# Patient Record
Sex: Female | Born: 1945 | Race: White | Hispanic: No | Smoking: Never smoker
Health system: Southern US, Community
[De-identification: ages and names within clinical notes are randomized; demographics above are authoritative.]

## PROBLEM LIST (undated history)

## (undated) DIAGNOSIS — I119 Hypertensive heart disease without heart failure: Secondary | ICD-10-CM

## (undated) DIAGNOSIS — C4490 Unspecified malignant neoplasm of skin, unspecified: Secondary | ICD-10-CM

## (undated) DIAGNOSIS — M7989 Other specified soft tissue disorders: Secondary | ICD-10-CM

## (undated) DIAGNOSIS — F32A Depression, unspecified: Secondary | ICD-10-CM

## (undated) DIAGNOSIS — F419 Anxiety disorder, unspecified: Secondary | ICD-10-CM

## (undated) DIAGNOSIS — E039 Hypothyroidism, unspecified: Secondary | ICD-10-CM

## (undated) DIAGNOSIS — E041 Nontoxic single thyroid nodule: Secondary | ICD-10-CM

## (undated) DIAGNOSIS — R21 Rash and other nonspecific skin eruption: Secondary | ICD-10-CM

## (undated) DIAGNOSIS — I5042 Chronic combined systolic (congestive) and diastolic (congestive) heart failure: Secondary | ICD-10-CM

## (undated) DIAGNOSIS — Z0389 Encounter for observation for other suspected diseases and conditions ruled out: Secondary | ICD-10-CM

## (undated) DIAGNOSIS — R0602 Shortness of breath: Secondary | ICD-10-CM

## (undated) DIAGNOSIS — R351 Nocturia: Secondary | ICD-10-CM

## (undated) DIAGNOSIS — G4733 Obstructive sleep apnea (adult) (pediatric): Secondary | ICD-10-CM

## (undated) DIAGNOSIS — G4730 Sleep apnea, unspecified: Secondary | ICD-10-CM

## (undated) DIAGNOSIS — I2699 Other pulmonary embolism without acute cor pulmonale: Secondary | ICD-10-CM

## (undated) DIAGNOSIS — M2550 Pain in unspecified joint: Secondary | ICD-10-CM

## (undated) DIAGNOSIS — N950 Postmenopausal bleeding: Secondary | ICD-10-CM

## (undated) DIAGNOSIS — Z9289 Personal history of other medical treatment: Secondary | ICD-10-CM

## (undated) DIAGNOSIS — E669 Obesity, unspecified: Secondary | ICD-10-CM

## (undated) DIAGNOSIS — IMO0001 Reserved for inherently not codable concepts without codable children: Secondary | ICD-10-CM

## (undated) DIAGNOSIS — Z86718 Personal history of other venous thrombosis and embolism: Secondary | ICD-10-CM

## (undated) DIAGNOSIS — M1990 Unspecified osteoarthritis, unspecified site: Secondary | ICD-10-CM

## (undated) DIAGNOSIS — I82403 Acute embolism and thrombosis of unspecified deep veins of lower extremity, bilateral: Secondary | ICD-10-CM

## (undated) HISTORY — DX: Obesity, unspecified: E66.9

## (undated) HISTORY — DX: Personal history of other medical treatment: Z92.89

## (undated) HISTORY — DX: Hypertensive heart disease without heart failure: I11.9

## (undated) HISTORY — DX: Pain in unspecified joint: M25.50

## (undated) HISTORY — DX: Chronic combined systolic (congestive) and diastolic (congestive) heart failure: I50.42

## (undated) HISTORY — DX: Obstructive sleep apnea (adult) (pediatric): G47.33

## (undated) HISTORY — DX: Reserved for inherently not codable concepts without codable children: IMO0001

## (undated) HISTORY — DX: Other specified soft tissue disorders: M79.89

## (undated) HISTORY — DX: Personal history of other venous thrombosis and embolism: Z86.718

## (undated) HISTORY — DX: Unspecified malignant neoplasm of skin, unspecified: C44.90

## (undated) HISTORY — DX: Hypothyroidism, unspecified: E03.9

## (undated) HISTORY — DX: Shortness of breath: R06.02

## (undated) HISTORY — PX: CARDIOVASCULAR STRESS TEST: SHX262

## (undated) HISTORY — DX: Depression, unspecified: F32.A

## (undated) HISTORY — DX: Encounter for observation for other suspected diseases and conditions ruled out: Z03.89

## (undated) HISTORY — DX: Anxiety disorder, unspecified: F41.9

## (undated) HISTORY — DX: Sleep apnea, unspecified: G47.30

---

## 1986-05-16 HISTORY — PX: OTHER SURGICAL HISTORY: SHX169

## 1998-02-26 ENCOUNTER — Encounter: Payer: Self-pay | Admitting: Internal Medicine

## 1998-02-26 ENCOUNTER — Ambulatory Visit (HOSPITAL_COMMUNITY): Admission: RE | Admit: 1998-02-26 | Discharge: 1998-02-26 | Payer: Self-pay | Admitting: Internal Medicine

## 2000-02-25 ENCOUNTER — Encounter: Payer: Self-pay | Admitting: Internal Medicine

## 2000-02-25 ENCOUNTER — Ambulatory Visit (HOSPITAL_COMMUNITY): Admission: RE | Admit: 2000-02-25 | Discharge: 2000-02-25 | Payer: Self-pay | Admitting: Internal Medicine

## 2001-04-18 ENCOUNTER — Emergency Department (HOSPITAL_COMMUNITY): Admission: EM | Admit: 2001-04-18 | Discharge: 2001-04-18 | Payer: Self-pay | Admitting: Emergency Medicine

## 2002-07-04 ENCOUNTER — Ambulatory Visit (HOSPITAL_COMMUNITY): Admission: RE | Admit: 2002-07-04 | Discharge: 2002-07-04 | Payer: Self-pay | Admitting: Family Medicine

## 2002-07-04 ENCOUNTER — Encounter: Payer: Self-pay | Admitting: Family Medicine

## 2009-03-19 ENCOUNTER — Ambulatory Visit (HOSPITAL_COMMUNITY): Admission: RE | Admit: 2009-03-19 | Discharge: 2009-03-19 | Payer: Self-pay | Admitting: Obstetrics and Gynecology

## 2009-03-23 ENCOUNTER — Other Ambulatory Visit: Admission: RE | Admit: 2009-03-23 | Discharge: 2009-03-23 | Payer: Self-pay | Admitting: Obstetrics and Gynecology

## 2009-03-24 ENCOUNTER — Ambulatory Visit (HOSPITAL_COMMUNITY): Admission: RE | Admit: 2009-03-24 | Discharge: 2009-03-24 | Payer: Self-pay | Admitting: Obstetrics and Gynecology

## 2009-03-24 ENCOUNTER — Encounter (INDEPENDENT_AMBULATORY_CARE_PROVIDER_SITE_OTHER): Payer: Self-pay | Admitting: Obstetrics and Gynecology

## 2009-03-24 HISTORY — PX: HYSTEROSCOPY W/D&C: SHX1775

## 2010-08-17 ENCOUNTER — Other Ambulatory Visit: Payer: Self-pay | Admitting: Family Medicine

## 2010-08-17 ENCOUNTER — Inpatient Hospital Stay: Admission: RE | Admit: 2010-08-17 | Payer: Self-pay | Source: Ambulatory Visit

## 2010-08-17 DIAGNOSIS — E041 Nontoxic single thyroid nodule: Secondary | ICD-10-CM

## 2010-08-18 ENCOUNTER — Other Ambulatory Visit: Payer: Self-pay

## 2010-08-18 LAB — BASIC METABOLIC PANEL
BUN: 4 mg/dL — ABNORMAL LOW (ref 6–23)
CO2: 27 mEq/L (ref 19–32)
Calcium: 8.5 mg/dL (ref 8.4–10.5)
Chloride: 101 mEq/L (ref 96–112)
Creatinine, Ser: 0.54 mg/dL (ref 0.4–1.2)
GFR calc Af Amer: >60 mL/min (ref >60)
GFR calc non Af Amer: >60 mL/min (ref >60)
Glucose, Bld: 82 mg/dL (ref 70–99)
Potassium: 3.5 mEq/L (ref 3.5–5.1)
Sodium: 136 mEq/L (ref 135–145)

## 2010-08-19 ENCOUNTER — Ambulatory Visit
Admission: RE | Admit: 2010-08-19 | Discharge: 2010-08-19 | Disposition: A | Discharge status: Home / Self Care | Payer: 59 | Source: Ambulatory Visit | Attending: Family Medicine | Admitting: Family Medicine

## 2010-08-19 DIAGNOSIS — E041 Nontoxic single thyroid nodule: Secondary | ICD-10-CM

## 2012-05-30 ENCOUNTER — Other Ambulatory Visit: Payer: Self-pay | Admitting: Obstetrics and Gynecology

## 2012-06-19 ENCOUNTER — Encounter (HOSPITAL_COMMUNITY): Payer: Self-pay | Admitting: *Deleted

## 2012-06-20 ENCOUNTER — Ambulatory Visit (HOSPITAL_COMMUNITY): Payer: 59 | Admitting: Anesthesiology

## 2012-06-20 ENCOUNTER — Other Ambulatory Visit: Payer: Self-pay

## 2012-06-20 ENCOUNTER — Encounter (HOSPITAL_COMMUNITY): Payer: Self-pay | Admitting: Anesthesiology

## 2012-06-20 ENCOUNTER — Encounter (HOSPITAL_COMMUNITY)
Admission: RE | Disposition: A | Discharge status: Home / Self Care | Payer: Self-pay | Source: Ambulatory Visit | Attending: Obstetrics and Gynecology

## 2012-06-20 ENCOUNTER — Ambulatory Visit (HOSPITAL_COMMUNITY)
Admission: RE | Admit: 2012-06-20 | Discharge: 2012-06-20 | Disposition: A | Discharge status: Home / Self Care | Payer: 59 | Source: Ambulatory Visit | Attending: Obstetrics and Gynecology | Admitting: Obstetrics and Gynecology

## 2012-06-20 ENCOUNTER — Encounter (HOSPITAL_COMMUNITY): Payer: Self-pay | Admitting: *Deleted

## 2012-06-20 DIAGNOSIS — N950 Postmenopausal bleeding: Secondary | ICD-10-CM

## 2012-06-20 HISTORY — DX: Nontoxic single thyroid nodule: E04.1

## 2012-06-20 HISTORY — PX: DILATION AND CURETTAGE OF UTERUS: SHX78

## 2012-06-20 LAB — CBC
HCT: 41 % (ref 36.0–46.0)
Hemoglobin: 13.2 g/dL (ref 12.0–15.0)
RBC: 4.65 MIL/uL (ref 3.87–5.11)
WBC: 6.4 10*3/uL (ref 4.0–10.5)

## 2012-06-20 SURGERY — DILATION AND CURETTAGE
Anesthesia: Choice

## 2012-06-20 MED ORDER — MIDAZOLAM HCL 5 MG/5ML IJ SOLN
INTRAMUSCULAR | Status: DC | PRN
  Administered 2012-06-20: 1 mg via INTRAVENOUS

## 2012-06-20 MED ORDER — ONDANSETRON HCL 4 MG/2ML IJ SOLN
INTRAMUSCULAR | Status: AC
  Filled 2012-06-20: qty 2

## 2012-06-20 MED ORDER — LIDOCAINE HCL 0.5 % IJ SOLN
INTRAMUSCULAR | Status: AC
  Filled 2012-06-20: qty 1

## 2012-06-20 MED ORDER — PROPOFOL 10 MG/ML IV EMUL
INTRAVENOUS | Status: DC | PRN
  Administered 2012-06-20: 200 mg via INTRAVENOUS

## 2012-06-20 MED ORDER — FENTANYL CITRATE 0.05 MG/ML IJ SOLN
INTRAMUSCULAR | Status: AC
  Filled 2012-06-20: qty 2

## 2012-06-20 MED ORDER — LIDOCAINE HCL (CARDIAC) 20 MG/ML IV SOLN
INTRAVENOUS | Status: AC
  Filled 2012-06-20: qty 5

## 2012-06-20 MED ORDER — FENTANYL CITRATE 0.05 MG/ML IJ SOLN
25.0000 ug | INTRAMUSCULAR | Status: DC | PRN
  Administered 2012-06-20: 50 ug via INTRAVENOUS

## 2012-06-20 MED ORDER — 0.9 % SODIUM CHLORIDE (POUR BTL) OPTIME
TOPICAL | Status: DC | PRN
  Administered 2012-06-20: 1000 mL

## 2012-06-20 MED ORDER — KETOROLAC TROMETHAMINE 30 MG/ML IJ SOLN
INTRAMUSCULAR | Status: AC
  Administered 2012-06-20: 30 mg via INTRAVENOUS
  Filled 2012-06-20: qty 1

## 2012-06-20 MED ORDER — DEXAMETHASONE SODIUM PHOSPHATE 10 MG/ML IJ SOLN
INTRAMUSCULAR | Status: DC | PRN
  Administered 2012-06-20: 10 mg via INTRAVENOUS

## 2012-06-20 MED ORDER — PROPOFOL 10 MG/ML IV BOLUS: INTRAVENOUS | Status: DC | PRN

## 2012-06-20 MED ORDER — LIDOCAINE HCL 1 % IJ SOLN
INTRAMUSCULAR | Status: DC | PRN
  Administered 2012-06-20: 10 mL

## 2012-06-20 MED ORDER — MIDAZOLAM HCL 2 MG/2ML IJ SOLN
INTRAMUSCULAR | Status: AC
  Filled 2012-06-20: qty 2

## 2012-06-20 MED ORDER — PROPOFOL 10 MG/ML IV EMUL
INTRAVENOUS | Status: AC
  Filled 2012-06-20: qty 40

## 2012-06-20 MED ORDER — CEFAZOLIN SODIUM-DEXTROSE 2-3 GM-% IV SOLR: 2.0000 g | INTRAVENOUS | Status: DC

## 2012-06-20 MED ORDER — LIDOCAINE HCL (CARDIAC) 20 MG/ML IV SOLN
INTRAVENOUS | Status: DC | PRN
  Administered 2012-06-20: 80 mg via INTRAVENOUS

## 2012-06-20 MED ORDER — LACTATED RINGERS IV SOLN
INTRAVENOUS | Status: DC
  Administered 2012-06-20 (×2): via INTRAVENOUS

## 2012-06-20 MED ORDER — KETOROLAC TROMETHAMINE 30 MG/ML IJ SOLN
30.0000 mg | Freq: Once | INTRAMUSCULAR | Status: AC
  Administered 2012-06-20: 30 mg via INTRAVENOUS

## 2012-06-20 MED ORDER — DEXAMETHASONE SODIUM PHOSPHATE 10 MG/ML IJ SOLN
INTRAMUSCULAR | Status: AC
  Filled 2012-06-20: qty 1

## 2012-06-20 MED ORDER — ONDANSETRON HCL 4 MG/2ML IJ SOLN
INTRAMUSCULAR | Status: DC | PRN
  Administered 2012-06-20: 4 mg via INTRAVENOUS

## 2012-06-20 MED ORDER — FENTANYL CITRATE 0.05 MG/ML IJ SOLN
INTRAMUSCULAR | Status: AC
  Administered 2012-06-20: 50 ug via INTRAVENOUS
  Filled 2012-06-20: qty 2

## 2012-06-20 MED ORDER — CEFAZOLIN SODIUM-DEXTROSE 2-3 GM-% IV SOLR
INTRAVENOUS | Status: AC
  Administered 2012-06-20: 2 g via INTRAVENOUS
  Filled 2012-06-20: qty 50

## 2012-06-20 MED ORDER — FENTANYL CITRATE 0.05 MG/ML IJ SOLN
INTRAMUSCULAR | Status: DC | PRN
  Administered 2012-06-20: 50 ug via INTRAVENOUS

## 2012-06-20 SURGICAL SUPPLY — 13 items
CATH ROBINSON RED A/P 16FR (CATHETERS) ×2 IMPLANT
CLOTH BEACON ORANGE TIMEOUT ST (SAFETY) ×2 IMPLANT
CONTAINER PREFILL 10% NBF 60ML (FORM) ×4 IMPLANT
DRESSING TELFA 8X3 (GAUZE/BANDAGES/DRESSINGS) ×2 IMPLANT
GLOVE BIO SURGEON STRL SZ 6.5 (GLOVE) ×4 IMPLANT
GOWN STRL REIN XL XLG (GOWN DISPOSABLE) ×4 IMPLANT
NEEDLE SPNL 22GX3.5 QUINCKE BK (NEEDLE) ×2 IMPLANT
PACK VAGINAL MINOR WOMEN LF (CUSTOM PROCEDURE TRAY) ×2 IMPLANT
PAD OB MATERNITY 4.3X12.25 (PERSONAL CARE ITEMS) ×2 IMPLANT
PAD PREP 24X48 CUFFED NSTRL (MISCELLANEOUS) ×2 IMPLANT
SYR CONTROL 10ML LL (SYRINGE) ×2 IMPLANT
TOWEL OR 17X24 6PK STRL BLUE (TOWEL DISPOSABLE) ×4 IMPLANT
WATER STERILE IRR 1000ML POUR (IV SOLUTION) ×2 IMPLANT

## 2012-06-20 NOTE — Anesthesia Preprocedure Evaluation (Addendum)
Anesthesia Evaluation  Patient identified by MRN, date of birth, ID band Patient awake    Reviewed: Allergy & Precautions, H&P , Patient's Chart, lab work & pertinent test results, reviewed documented beta blocker date and time   Airway Mallampati: II TM Distance: >3 FB Neck ROM: full    Dental No notable dental hx.    Pulmonary  breath sounds clear to auscultation  Pulmonary exam normal       Cardiovascular hypertension (well controlled), Pt. on medications Rhythm:regular Rate:Normal     Neuro/Psych    GI/Hepatic   Endo/Other  Morbid obesity  Renal/GU      Musculoskeletal   Abdominal   Peds  Hematology   Anesthesia Other Findings Subtle EKG changes from 11/10; no signif changes to warrant further w/u for D&C. Pt has no sx of CAD. Mother alive and father dead of valvular Dz. BP well controlled  Reproductive/Obstetrics                          Anesthesia Physical Anesthesia Plan  ASA: III  Anesthesia Plan: General   Post-op Pain Management:    Induction: Intravenous  Airway Management Planned: LMA  Additional Equipment:   Intra-op Plan:   Post-operative Plan:   Informed Consent: I have reviewed the patients History and Physical, chart, labs and discussed the procedure including the risks, benefits and alternatives for the proposed anesthesia with the patient or authorized representative who has indicated his/her understanding and acceptance.   Dental Advisory Given  Plan Discussed with: CRNA and Surgeon  Anesthesia Plan Comments: (  Discussed  general anesthesia, including possible nausea, instrumentation of airway, sore throat,pulmonary aspiration, etc. I asked if the were any outstanding questions, or  concerns before we proceeded. )        Anesthesia Quick Evaluation

## 2012-06-20 NOTE — Brief Op Note (Signed)
06/20/2012  1:37 PM  PATIENT:  Nicole Gibbs  67 y.o. female  PRE-OPERATIVE DIAGNOSIS:  PMB cpt 58120  POST-OPERATIVE DIAGNOSIS:  post menopausal bleeding  PROCEDURE:  Procedure(s) (LRB) with comments: DILATATION AND CURETTAGE (N/A)  SURGEON:  Surgeon(s) and Role:    * Jeani Hawking, MD - Primary  PHYSICIAN ASSISTANT:   ASSISTANTS: none   ANESTHESIA:   paracervical block and MAC  EBL:  Total I/O In: 500 [I.V.:500] Out: -   BLOOD ADMINISTERED:none  DRAINS: none   LOCAL MEDICATIONS USED:  XYLOCAINE   SPECIMEN:  Source of Specimen:  uterine currettings  DISPOSITION OF SPECIMEN:  PATHOLOGY  COUNTS:  YES  TOURNIQUET:  * No tourniquets in log *  DICTATION: .Other Dictation: Dictation Number [id number]  PLAN OF CARE: Discharge to home after PACU  PATIENT DISPOSITION:  PACU - hemodynamically stable.   Delay start of Pharmacological VTE agent (>24hrs) due to surgical blood loss or risk of bleeding: not applicable

## 2012-06-20 NOTE — H&P (Signed)
67 year old female with postmenopausal bleeding. Patient had ultrasound last week stripe was greater than 2 cm. EMB showed possible endometrial polyp. Seen in office yesterday with very heavy bleeding and clots   Recommended D and C today  Medical HIstory Hypertension  Surgical history D and C  C Section   Meds  See list  Sulfa allergy  Afebrile VSS General alert and oriented Lung CTAB Car RRR Abdomen is soft and non tender  Pelvic yesterday moderate bleeding  IMPRESSION: Post menopausal bleeding  PLAN: D and C Risks reviewed Consent signed

## 2012-06-20 NOTE — Op Note (Signed)
Nicole Gibbs, Nicole Gibbs                 ACCOUNT NO.:  [id number]  MEDICAL RECORD NO.:  [id number]  LOCATION:  [facility]                          FACILITY:  [facility]  PHYSICIAN:  Jimeka Balan L. Daichi Moris, M.D.DATE OF BIRTH:  1945-06-13  DATE OF PROCEDURE:  06/20/2012 DATE OF DISCHARGE:  06/20/2012                              OPERATIVE REPORT   PREOPERATIVE DIAGNOSIS:  Postmenopausal bleeding.  POSTOPERATIVE DIAGNOSIS:  Postmenopausal bleeding.  PROCEDURE:  D and C.  SURGEON:  Nicole Gibbs, M.D.  ANESTHESIA:  MAC with paracervical block.  EBL:  Minimal.  COMPLICATIONS:  None.  DESCRIPTION OF PROCEDURE:  The patient was taken to the operating room. She had been counseled about the risk associated with the procedure, which is risk of infection, bleeding, and risk of uterine perforation. Once she was taken to the operating room, she was placed in the lithotomy position in standard fashion.  She was prepped and draped.  A speculum was inserted into the vagina.  Her vaginal bleeding was moderate.  The cervix was grasped with a tenaculum.  Paracervical block was performed in standard fashion.  The cervical internal os was gently dilated using the [facility] dilator and a small curette was inserted into the uterus.  A curettage was performed x3 with the tissue obtained which was moderate.  Polyp forceps were inserted 1 time and minimal amount of tissue was then removed.  I then observed for a long period of time in operating room, and I did not notice any excessive bleeding.  All tissue was sent to pathology.  All sponge, lap, and instrument counts were correct x2 after all instruments removed from the vagina.  The patient tolerated procedure well, and will be discharged home from recovery room.     [name], M.D.     Florestine Avers  D:  06/20/2012  T:  06/20/2012  Job:  [id number]

## 2012-06-20 NOTE — Transfer of Care (Signed)
Immediate Anesthesia Transfer of Care Note  Patient: Nicole Gibbs  Procedure(s) Performed: Procedure(s) (LRB) with comments: DILATATION AND CURETTAGE (N/A)  Patient Location: PACU  Anesthesia Type:General  Level of Consciousness: awake, alert  and oriented  Airway & Oxygen Therapy: Patient Spontanous Breathing and Patient connected to nasal cannula oxygen  Post-op Assessment: Report given to PACU RN and Post -op Vital signs reviewed and stable  Post vital signs: Reviewed and stable  Complications: No apparent anesthesia complications

## 2012-06-20 NOTE — Anesthesia Postprocedure Evaluation (Signed)
  Anesthesia Post-op Note  Patient: Nicole Gibbs  Procedure(s) Performed: Procedure(s) (LRB) with comments: DILATATION AND CURETTAGE (N/A)  Patient Location: PACU  Anesthesia Type:General  Level of Consciousness: awake, alert  and oriented  Airway and Oxygen Therapy: Patient Spontanous Breathing  Post-op Pain: none  Post-op Assessment: Post-op Vital signs reviewed, Patient's Cardiovascular Status Stable, Respiratory Function Stable, Patent Airway, No signs of Nausea or vomiting and Pain level controlled  Post-op Vital Signs: Reviewed and stable  Complications: No apparent anesthesia complications

## 2012-06-20 NOTE — Anesthesia Procedure Notes (Signed)
Procedure Name: LMA Insertion Date/Time: 06/20/2012 1:20 PM Performed by: Graciela Husbands Pre-anesthesia Checklist: Emergency Drugs available, Suction available, Patient being monitored, Patient identified and Timeout performed Patient Re-evaluated:Patient Re-evaluated prior to inductionOxygen Delivery Method: Circle system utilized Preoxygenation: Pre-oxygenation with 100% oxygen Intubation Type: IV induction LMA: LMA inserted and LMA with gastric port inserted LMA Size: 4.0 Number of attempts: 1 Airway Equipment and Method: Patient positioned with wedge pillow Placement Confirmation: positive ETCO2 and breath sounds checked- equal and bilateral Tube secured with: Tape Dental Injury: Teeth and Oropharynx as per pre-operative assessment  Difficulty Due To: Difficulty was anticipated

## 2012-06-22 ENCOUNTER — Encounter (HOSPITAL_COMMUNITY): Payer: Self-pay | Admitting: Obstetrics and Gynecology

## 2012-08-03 ENCOUNTER — Ambulatory Visit
Admission: RE | Admit: 2012-08-03 | Discharge: 2012-08-03 | Disposition: A | Discharge status: Home / Self Care | Payer: 59 | Source: Ambulatory Visit | Attending: Cardiovascular Disease | Admitting: Cardiovascular Disease

## 2012-08-03 ENCOUNTER — Other Ambulatory Visit: Payer: Self-pay | Admitting: Cardiovascular Disease

## 2012-08-03 ENCOUNTER — Other Ambulatory Visit: Payer: Self-pay

## 2012-08-03 DIAGNOSIS — R0602 Shortness of breath: Secondary | ICD-10-CM

## 2012-08-06 ENCOUNTER — Other Ambulatory Visit (HOSPITAL_COMMUNITY): Payer: Self-pay | Admitting: Cardiovascular Disease

## 2012-08-06 ENCOUNTER — Other Ambulatory Visit: Payer: Self-pay | Admitting: Cardiovascular Disease

## 2012-08-06 DIAGNOSIS — I82409 Acute embolism and thrombosis of unspecified deep veins of unspecified lower extremity: Secondary | ICD-10-CM

## 2012-08-06 DIAGNOSIS — R0602 Shortness of breath: Secondary | ICD-10-CM

## 2012-08-06 DIAGNOSIS — R609 Edema, unspecified: Secondary | ICD-10-CM

## 2012-08-06 DIAGNOSIS — R079 Chest pain, unspecified: Secondary | ICD-10-CM

## 2012-08-07 ENCOUNTER — Ambulatory Visit (HOSPITAL_COMMUNITY)
Admission: RE | Admit: 2012-08-07 | Discharge: 2012-08-07 | Disposition: A | Discharge status: Home / Self Care | Payer: 59 | Source: Ambulatory Visit | Attending: Cardiovascular Disease | Admitting: Cardiovascular Disease

## 2012-08-07 ENCOUNTER — Ambulatory Visit (HOSPITAL_COMMUNITY): Payer: 59

## 2012-08-07 ENCOUNTER — Encounter (HOSPITAL_COMMUNITY): Payer: Self-pay | Admitting: Cardiology

## 2012-08-07 ENCOUNTER — Inpatient Hospital Stay (HOSPITAL_COMMUNITY)
Admission: AD | Admit: 2012-08-07 | Discharge: 2012-08-14 | DRG: 176 | Disposition: A | Discharge status: Home / Self Care | Payer: 59 | Source: Ambulatory Visit | Attending: Cardiovascular Disease | Admitting: Cardiovascular Disease

## 2012-08-07 DIAGNOSIS — R9431 Abnormal electrocardiogram [ECG] [EKG]: Secondary | ICD-10-CM | POA: Diagnosis present

## 2012-08-07 DIAGNOSIS — I10 Essential (primary) hypertension: Secondary | ICD-10-CM | POA: Diagnosis present

## 2012-08-07 DIAGNOSIS — I82403 Acute embolism and thrombosis of unspecified deep veins of lower extremity, bilateral: Secondary | ICD-10-CM

## 2012-08-07 DIAGNOSIS — N939 Abnormal uterine and vaginal bleeding, unspecified: Secondary | ICD-10-CM | POA: Diagnosis present

## 2012-08-07 DIAGNOSIS — I498 Other specified cardiac arrhythmias: Secondary | ICD-10-CM | POA: Diagnosis present

## 2012-08-07 DIAGNOSIS — Z8489 Family history of other specified conditions: Secondary | ICD-10-CM

## 2012-08-07 DIAGNOSIS — R0600 Dyspnea, unspecified: Secondary | ICD-10-CM | POA: Diagnosis present

## 2012-08-07 DIAGNOSIS — E669 Obesity, unspecified: Secondary | ICD-10-CM | POA: Diagnosis present

## 2012-08-07 DIAGNOSIS — Z882 Allergy status to sulfonamides status: Secondary | ICD-10-CM

## 2012-08-07 DIAGNOSIS — N898 Other specified noninflammatory disorders of vagina: Secondary | ICD-10-CM | POA: Diagnosis present

## 2012-08-07 DIAGNOSIS — Z6841 Body Mass Index (BMI) 40.0 and over, adult: Secondary | ICD-10-CM

## 2012-08-07 DIAGNOSIS — I82409 Acute embolism and thrombosis of unspecified deep veins of unspecified lower extremity: Secondary | ICD-10-CM | POA: Diagnosis present

## 2012-08-07 DIAGNOSIS — R0989 Other specified symptoms and signs involving the circulatory and respiratory systems: Secondary | ICD-10-CM | POA: Diagnosis present

## 2012-08-07 DIAGNOSIS — I2699 Other pulmonary embolism without acute cor pulmonale: Principal | ICD-10-CM | POA: Diagnosis present

## 2012-08-07 DIAGNOSIS — Z8249 Family history of ischemic heart disease and other diseases of the circulatory system: Secondary | ICD-10-CM

## 2012-08-07 DIAGNOSIS — R609 Edema, unspecified: Secondary | ICD-10-CM

## 2012-08-07 DIAGNOSIS — R0602 Shortness of breath: Secondary | ICD-10-CM

## 2012-08-07 DIAGNOSIS — N390 Urinary tract infection, site not specified: Secondary | ICD-10-CM | POA: Diagnosis present

## 2012-08-07 DIAGNOSIS — R0609 Other forms of dyspnea: Secondary | ICD-10-CM | POA: Diagnosis present

## 2012-08-07 DIAGNOSIS — I824Z9 Acute embolism and thrombosis of unspecified deep veins of unspecified distal lower extremity: Secondary | ICD-10-CM | POA: Diagnosis present

## 2012-08-07 DIAGNOSIS — R079 Chest pain, unspecified: Secondary | ICD-10-CM

## 2012-08-07 LAB — APTT: aPTT: 37 seconds (ref 24–37)

## 2012-08-07 LAB — COMPREHENSIVE METABOLIC PANEL
ALT: 14 U/L (ref 0–35)
AST: 22 U/L (ref 0–37)
Albumin: 3.5 g/dL (ref 3.5–5.2)
Alkaline Phosphatase: 88 U/L (ref 39–117)
BUN: 9 mg/dL (ref 6–23)
CO2: 21 mEq/L (ref 19–32)
Calcium: 8.8 mg/dL (ref 8.4–10.5)
Chloride: 104 mEq/L (ref 96–112)
Creatinine, Ser: 0.62 mg/dL (ref 0.50–1.10)
GFR calc Af Amer: >90 mL/min (ref >90)
GFR calc non Af Amer: >90 mL/min (ref >90)
Glucose, Bld: 111 mg/dL — ABNORMAL HIGH (ref 70–99)
Potassium: 4 mEq/L (ref 3.5–5.1)
Sodium: 137 mEq/L (ref 135–145)
Total Bilirubin: 0.4 mg/dL (ref 0.3–1.2)
Total Protein: 7.3 g/dL (ref 6.0–8.3)

## 2012-08-07 LAB — CBC WITH DIFFERENTIAL/PLATELET
Basophils Absolute: 0 10*3/uL (ref 0.0–0.1)
Basophils Relative: 0 % (ref 0–1)
Eosinophils Absolute: 0.2 10*3/uL (ref 0.0–0.7)
Eosinophils Relative: 2 % (ref 0–5)
HCT: 39.3 % (ref 36.0–46.0)
Hemoglobin: 12.8 g/dL (ref 12.0–15.0)
Lymphocytes Relative: 30 % (ref 12–46)
Lymphs Abs: 3.2 10*3/uL (ref 0.7–4.0)
MCH: 26.6 pg (ref 26.0–34.0)
MCHC: 32.6 g/dL (ref 30.0–36.0)
MCV: 81.7 fL (ref 78.0–100.0)
Monocytes Absolute: 0.9 10*3/uL (ref 0.1–1.0)
Monocytes Relative: 8 % (ref 3–12)
Neutro Abs: 6.3 10*3/uL (ref 1.7–7.7)
Neutrophils Relative %: 59 % (ref 43–77)
Platelets: 524 10*3/uL — ABNORMAL HIGH (ref 150–400)
RBC: 4.81 MIL/uL (ref 3.87–5.11)
RDW: 13.5 % (ref 11.5–15.5)
WBC: 10.6 10*3/uL — ABNORMAL HIGH (ref 4.0–10.5)

## 2012-08-07 LAB — TROPONIN I: Troponin I: <0.3 ng/mL (ref <0.30)

## 2012-08-07 LAB — PROTIME-INR
INR: 1.1 (ref 0.00–1.49)
Prothrombin Time: 14.1 seconds (ref 11.6–15.2)

## 2012-08-07 MED ORDER — WARFARIN SODIUM 7.5 MG PO TABS
7.5000 mg | ORAL_TABLET | Freq: Once | ORAL | Status: AC
  Administered 2012-08-07: 7.5 mg via ORAL
  Filled 2012-08-07: qty 1

## 2012-08-07 MED ORDER — ALPRAZOLAM 0.25 MG PO TABS: 0.2500 mg | ORAL_TABLET | Freq: Three times a day (TID) | ORAL | Status: DC | PRN

## 2012-08-07 MED ORDER — REGADENOSON 0.4 MG/5ML IV SOLN
0.4000 mg | Freq: Once | INTRAVENOUS | Status: AC
  Administered 2012-08-07: 0.4 mg via INTRAVENOUS

## 2012-08-07 MED ORDER — WARFARIN - PHARMACIST DOSING INPATIENT
Freq: Every day | Status: DC
  Administered 2012-08-13: 18:00:00

## 2012-08-07 MED ORDER — ASPIRIN EC 81 MG PO TBEC
81.0000 mg | DELAYED_RELEASE_TABLET | Freq: Every day | ORAL | Status: DC
  Administered 2012-08-08 – 2012-08-14 (×7): 81 mg via ORAL
  Filled 2012-08-07 (×7): qty 1

## 2012-08-07 MED ORDER — PANTOPRAZOLE SODIUM 40 MG PO TBEC
40.0000 mg | DELAYED_RELEASE_TABLET | Freq: Every day | ORAL | Status: DC
  Administered 2012-08-08 – 2012-08-14 (×7): 40 mg via ORAL
  Filled 2012-08-07 (×7): qty 1

## 2012-08-07 MED ORDER — VERAPAMIL HCL ER 240 MG PO TBCR
240.0000 mg | EXTENDED_RELEASE_TABLET | Freq: Every day | ORAL | Status: DC
  Administered 2012-08-08 – 2012-08-14 (×7): 240 mg via ORAL
  Filled 2012-08-07 (×7): qty 1

## 2012-08-07 MED ORDER — ACETAMINOPHEN 325 MG PO TABS: 650.0000 mg | ORAL_TABLET | ORAL | Status: DC | PRN

## 2012-08-07 MED ORDER — WARFARIN VIDEO
Freq: Once | Status: AC
  Administered 2012-08-08: 12:00:00

## 2012-08-07 MED ORDER — ENOXAPARIN SODIUM 30 MG/0.3ML ~~LOC~~ SOLN
20.0000 mg | SUBCUTANEOUS | Status: AC
  Administered 2012-08-07: 20 mg via SUBCUTANEOUS
  Filled 2012-08-07: qty 0.2

## 2012-08-07 MED ORDER — NITROGLYCERIN 0.4 MG SL SUBL: 0.4000 mg | SUBLINGUAL_TABLET | SUBLINGUAL | Status: DC | PRN

## 2012-08-07 MED ORDER — ONDANSETRON HCL 4 MG/2ML IJ SOLN: 4.0000 mg | Freq: Four times a day (QID) | INTRAMUSCULAR | Status: DC | PRN

## 2012-08-07 MED ORDER — ZOLPIDEM TARTRATE 5 MG PO TABS
5.0000 mg | ORAL_TABLET | Freq: Every evening | ORAL | Status: DC | PRN
  Administered 2012-08-08 – 2012-08-13 (×6): 5 mg via ORAL
  Filled 2012-08-07 (×6): qty 1

## 2012-08-07 MED ORDER — COUMADIN BOOK
Freq: Once | Status: AC
  Administered 2012-08-07: 20:00:00
  Filled 2012-08-07: qty 1

## 2012-08-07 MED ORDER — ENOXAPARIN SODIUM 100 MG/ML ~~LOC~~ SOLN
100.0000 mg | Freq: Two times a day (BID) | SUBCUTANEOUS | Status: DC
  Administered 2012-08-08 – 2012-08-14 (×13): 100 mg via SUBCUTANEOUS
  Filled 2012-08-07 (×15): qty 1

## 2012-08-07 MED ORDER — TECHNETIUM TC 99M SESTAMIBI GENERIC - CARDIOLITE
30.0000 | Freq: Once | INTRAVENOUS | Status: AC | PRN
  Administered 2012-08-07: 30 via INTRAVENOUS

## 2012-08-07 MED ORDER — TECHNETIUM TC 99M SESTAMIBI GENERIC - CARDIOLITE
10.0000 | Freq: Once | INTRAVENOUS | Status: AC | PRN
  Administered 2012-08-07: 10 via INTRAVENOUS

## 2012-08-07 MED ORDER — AMINOPHYLLINE 25 MG/ML IV SOLN
75.0000 mg | Freq: Once | INTRAVENOUS | Status: AC
  Administered 2012-08-07: 75 mg via INTRAVENOUS

## 2012-08-07 NOTE — Procedures (Addendum)
[place] [facility] [facility] [address] [place] [address] [phone]  Cardiology Nuclear Med Study  Nicole Gibbs is a 67 y.o. female     MRN : [id number]     DOB: 11-30-1945  Procedure Date: 08/07/2012  Nuclear Med Background Indication for Stress Test:  Evaluation for Ischemia History:  NO PRIOR CARDIAC HISTORY Cardiac Risk Factors: Family History - CAD, Hypertension and Obesity  Symptoms:  DOE, Fatigue and SOB   Nuclear Pre-Procedure Caffeine/Decaff Intake:  10:00pm NPO After: 8:00am   IV Site: R Antecubital  IV 0.9% NS with Angio Cath:  22g  Chest Size (in):  N/A IV Started by: Emmit Pomfret, RN  Height: 5\' 2"  (1.575 m)  Cup Size: DD  BMI:  Body mass index is 42.06 kg/(m^2). Weight:  230 lb (104.327 kg)   Tech Comments:  N/A    Nuclear Med Study 1 or 2 day study: 1 day  Stress Test Type:  Lexiscan  Order Authorizing Provider:  Nanetta Batty, MD   Resting Radionuclide: Technetium 15m Sestamibi  Resting Radionuclide Dose: 11.0 mCi   Stress Radionuclide:  Technetium 89m Sestamibi  Stress Radionuclide Dose: 30.1 mCi           Stress Protocol Rest HR: 88 Stress HR: 101  Rest BP: 159/89 Stress BP:164/93  Exercise Time (min): n/a METS: n/a          Dose of Adenosine (mg):  n/a Dose of Lexiscan: 0.4 mg  Dose of Atropine (mg): n/a Dose of Dobutamine: n/a mcg/kg/min (at max HR)  Stress Test Technologist: Ernestene Mention, CCT Nuclear Technologist: Koren Shiver, CNMT   Rest Procedure:  Myocardial perfusion imaging was performed at rest 45 minutes following the intravenous administration of Technetium 1m Sestamibi. Stress Procedure:  The patient received IV Lexiscan 0.4 mg over 15-seconds.  Technetium 47m Sestamibi injected at 30-seconds.  Due to patient's extreme shortness of breath, she was given IV Aminophylline 75 mg. Symptom was resolved during recovery. There were no significant changes with Lexiscan.  Quantitative spect  images were obtained after a 45 minute delay.  Transient Ischemic Dilatation (Normal <1.22):  0.93 Lung/Heart Ratio (Normal <0.45):  0.35 QGS EDV:  80 ml QGS ESV:  39 ml LV Ejection Fraction: 51%  Rest ECG: NSR - Normal EKG and poor R wave progression  Stress ECG: No significant change from baseline ECG  QPS Raw Data Images:  Some RV uptake concerning for pulmonary hypertension Stress Images:  There is decreased uptake in the anterior wall. Rest Images:  There is decreased uptake in the anterior wall. Subtraction (SDS):  No evidence of ischemia.  Impression Exercise Capacity:  Lexiscan with no exercise. BP Response:  Hypotensive blood pressure response. Clinical Symptoms:  Extreme dyspnea ECG Impression:  No significant ECG changes with Lexiscan. Comparison with Prior Nuclear Study: No previous nuclear study performed  Overall Impression:  Low risk stress nuclear study. There is a fixed mid to distal anterior and apical defect which may represent scar versus breast attenuation artifact. There is hypokinesis of the distal anterior wall as well, favoring scar. RV uptake noted, suggesting possible pulmonary hypertension.  LV Wall Motion:  Mild distal anterior hypokinesis. EF 51%.  Chrystie Nose, MD, [facility] Board Certified in Nuclear Cardiology Attending Cardiologist [facility]  Chrystie Nose, MD  08/07/2012 5:50 PM

## 2012-08-07 NOTE — H&P (Signed)
Patient ID: Nicole Gibbs MRN: [id number], DOB/AGE: [birth year]   Admit date: 08/07/12   Primary Physician: Daisy Floro, MD Primary Cardiologist: Dr Allyson Sabal OB/GYN- Dr Jerre Simon  HPI: 67 y/o with no prior cardiac issues, referred to Dr Allyson Sabal 08/02/12 for SOB and an abnormal  EKG that was done at the time of an urgent [facility] Feb 2014. See his office notes for complete details. The pt has a history of significant vaginal bleeding and had to undergo an urgent D&C 06/20/12. She was then put on hormone shots. The pt has had DOE that had come on after recent medroxyprogesterone injections were started for vaginal bleeding. Dr Allyson Sabal ordered several studies including an echo, CXR, D dimer, Venous Dopplers, and a Lexiscan. Yesterday, ([place] the 24th) it was noted that her D- dimer was >5. Attempts were made in the office to contact the pt but these were unsuccessful. She came today as scheduled for her venous dopplers and Lexiscan. Her dopplers are in fact positive for bilat LE DVT and there is a high suspicion for pulmonary embolism with her history of SOB. She was given Lovenox in the office and will be admitted for CTA and Coumadin per pharmacy.   Problem List: Past Medical History  Diagnosis Date  . Thyroid nodule   . Hypertension     Past Surgical History  Procedure Laterality Date  . Cesarean section  1992  . Dilation and curettage of uterus  Nov 2011 (?)  . Dilation and curettage of uterus  06/20/2012    Procedure: DILATATION AND CURETTAGE;  Surgeon: Jeani Hawking, MD;  Location: [facility];  Service: Gynecology;  Laterality: N/A;     Allergies:  Allergies  Allergen Reactions  . Sulfa Antibiotics Hives     Home Medications Verapamil 240 Trazadone Naproxyn Medroxyprogesterone     Family History  Problem Relation Age of Onset  . Coronary artery disease Father 73  . Sudden death Sister      History   Social History  . Marital Status: Married    Spouse Name: N/A    Number  of Children: N/A  . Years of Education: N/A   Occupational History  . Not on file.   Social History Main Topics  . Smoking status: Never Smoker   . Smokeless tobacco: Not on file  . Alcohol Use: No  . Drug Use: No  . Sexually Active:    Other Topics Concern  . Not on file   Social History Narrative  . No narrative on file     Review of Systems: General: negative for chills, fever, night sweats or weight changes. Lots of stress, husband has  MS. Recent vaginal bleeding, S/P urgent D&C 06/20/12 Dermatological: negative for rash Urologic: negative for hematuria Abdominal: negative for nausea, vomiting, diarrhea, bright red blood per rectum, melena, or hematemesis Neurologic: negative for visual changes, syncope, or dizziness All other systems reviewed and are otherwise negative except as noted above.  Physical Exam: Blood pressure 130/78, pulse 94, temperature 97.4 F (36.3 C), temperature source Oral, resp. rate 20, height 5\' 2"  (1.575 m), weight 104.8 kg (231 lb 0.7 oz), SpO2 97.00%.  General appearance: alert, cooperative, no distress and morbidly obese Neck: no carotid bruit and no JVD Lungs: clear to auscultation bilaterally Heart: regular rate and rhythm Abdomen: obese Extremities: bilat 2+ LE edema Pulses: 2+ and symmetric Skin: Skin color, texture, turgor normal. No rashes or lesions Neurologic: Grossly normal    Labs:   Results for orders placed  during the hospital encounter of 08/07/12 (from the past 24 hour(s))  TROPONIN I     Status: None   Collection Time    08/07/12  7:29 PM      Result Value Range   Troponin I <0.30  <0.30 ng/mL  PROTIME-INR     Status: None   Collection Time    08/07/12  7:29 PM      Result Value Range   Prothrombin Time 14.1  11.6 - 15.2 seconds   INR 1.10  0.00 - 1.49  APTT     Status: None   Collection Time    08/07/12  7:29 PM      Result Value Range   aPTT 37  24 - 37 seconds  CBC WITH DIFFERENTIAL     Status: Abnormal    Collection Time    08/07/12  7:29 PM      Result Value Range   WBC 10.6 (*) 4.0 - 10.5 K/uL   RBC 4.81  3.87 - 5.11 MIL/uL   Hemoglobin 12.8  12.0 - 15.0 g/dL   HCT 16.1  09.6 - 04.5 %   MCV 81.7  78.0 - 100.0 fL   MCH 26.6  26.0 - 34.0 pg   MCHC 32.6  30.0 - 36.0 g/dL   RDW 40.9  81.1 - 91.4 %   Platelets 524 (*) 150 - 400 K/uL   Neutrophils Relative 59  43 - 77 %   Neutro Abs 6.3  1.7 - 7.7 K/uL   Lymphocytes Relative 30  12 - 46 %   Lymphs Abs 3.2  0.7 - 4.0 K/uL   Monocytes Relative 8  3 - 12 %   Monocytes Absolute 0.9  0.1 - 1.0 K/uL   Eosinophils Relative 2  0 - 5 %   Eosinophils Absolute 0.2  0.0 - 0.7 K/uL   Basophils Relative 0  0 - 1 %   Basophils Absolute 0.0  0.0 - 0.1 K/uL  TSH     Status: None   Collection Time    08/07/12  7:29 PM      Result Value Range   TSH 0.368  0.350 - 4.500 uIU/mL  COMPREHENSIVE METABOLIC PANEL     Status: Abnormal   Collection Time    08/07/12  7:29 PM      Result Value Range   Sodium 137  135 - 145 mEq/L   Potassium 4.0  3.5 - 5.1 mEq/L   Chloride 104  96 - 112 mEq/L   CO2 21  19 - 32 mEq/L   Glucose, Bld 111 (*) 70 - 99 mg/dL   BUN 9  6 - 23 mg/dL   Creatinine, Ser 7.82  0.50 - 1.10 mg/dL   Calcium 8.8  8.4 - 95.6 mg/dL   Total Protein 7.3  6.0 - 8.3 g/dL   Albumin 3.5  3.5 - 5.2 g/dL   AST 22  0 - 37 U/L   ALT 14  0 - 35 U/L   Alkaline Phosphatase 88  39 - 117 U/L   Total Bilirubin 0.4  0.3 - 1.2 mg/dL   GFR calc non Af Amer >90  >90 mL/min   GFR calc Af Amer >90  >90 mL/min  PROTIME-INR     Status: None   Collection Time    08/08/12  5:40 AM      Result Value Range   Prothrombin Time 14.0  11.6 - 15.2 seconds   INR 1.09  0.00 -  1.49     Radiology/Studies: Dg Chest 2 View  08/03/2012    IMPRESSION: No active lung disease.  Probable bronchitis.  Mild cardiomegaly.   Original Report Authenticated By: Dwyane Dee, M.D.     EKG: NSR/ ST poor anterior RW, inferior Q  ASSESSMENT AND PLAN:  Principal Problem:    Pulmonary embolism Active Problems:   DVT, bilat - office dopplers 08/07/12   Nonspecific abnormal  (EKG)   Vaginal bleeding- s/p urgent D&C Feb 5th 2014   Dyspnea on exertion   HTN (hypertension)   Family history of coronary artery bypass surgery   Family history of sudden death (sister)   Obesity, unspecified  Plan- Admit, Lovenox per pharmacy, CTA to r/o pulmonary embolism. Start Coumadin per pharmacy. Vaginal bleeding may be an issue. We will need to be in contact with Dr Vincente Poli.  ? Other options for vaginal bleeding,   Signed, Abelino Derrick, PA-C 08/08/2012, 10:40 AM   Patient seen and examined. Agree with assessment and plan. Pt seen in office and admitted with bilateral DVT's, increased D-dimer, sob, R/O pulmonary embolus. In office, pt was given lovenox 80 mg sq prior to leaving to [facility] for admission.   Lennette Bihari, MD, [facility] 08/08/2012 2:05 PM

## 2012-08-07 NOTE — Progress Notes (Signed)
Bilateral Lower Ext. Venous Duplex Completed. Nicole Gibbs D  

## 2012-08-07 NOTE — Progress Notes (Signed)
ANTICOAGULATION CONSULT NOTE - Initial Consult  Pharmacy Consult for Lovenox and Coumadin Indication: DVT and high suspicion for PE  Allergies  Allergen Reactions  . Sulfa Antibiotics Hives    Patient Measurements: Height: 5\' 2"  (157.5 cm) Weight: 231 lb 0.7 oz (104.8 kg) IBW/kg (Calculated) : 50.1  Vital Signs: Temp: 97.6 F (36.4 C) (03/25 1955) Temp src: Oral (03/25 1955) BP: 153/91 mmHg (03/25 1955) Pulse Rate: 101 (03/25 1955)  Labs:  Recent Labs  08/07/12 1929  HGB 12.8  HCT 39.3  PLT 524*  APTT 37  LABPROT 14.1  INR 1.10  CREATININE 0.62  TROPONINI <0.30    Estimated Creatinine Clearance: 80.8 ml/min (by C-G formula based on Cr of 0.62).   Medical History: Past Medical History  Diagnosis Date  . Thyroid nodule   . Hypertension     Medications:  Prescriptions prior to admission  Medication Sig Dispense Refill  . ibuprofen (ADVIL,MOTRIN) 200 MG tablet Take 200 mg by mouth 2 (two) times daily as needed for pain.      . medroxyPROGESTERone (DEPO-PROVERA) 150 MG/ML injection Inject 150 mg into the muscle every 3 (three) months.      Marland Kitchen PRESCRIPTION MEDICATION Apply 1 application topically daily as needed (NYSTOP POWDER.  Apply underneath breasts.).      Marland Kitchen VERAPAMIL HCL PO Take 1 tablet by mouth daily. Patient not sure of strength,pharmacy did not have on file - sometimes husband gets through mail order      . zolpidem (AMBIEN) 10 MG tablet Take 5-10 mg by mouth at bedtime as needed for sleep.        Assessment: 67 y/o female referred to cardiologist for SOB and abnormal EKG found to have B/L DVT and high suspicion for PE. SOB developed shortly after having medroxyprogesterone injections for vaginal bleeding. Pharmacy consulted to begin Lovenox and Coumadin. Of note, patient had Lovenox 80 mg SQ in the office today ~17:00 but this dose is low for her weight. Baseline INR, SCr, and CBC wnl. Coumadin score is 5.  Goal of Therapy:  INR 2-3 Anti-Xa level  0.6-1.2 units/ml 4hrs after LMWH dose given Monitor platelets by anticoagulation protocol: Yes   Plan:  -Lovenox 20 mg SQ now (to total 100 mg) -Lovenox 100 mg SQ q12h - dose due 3/26 06:00 -Coumadin 7.5 mg po tonight -INR daily, CBC q72h -Coumadin book and video -Monitor for signs/symptoms of bleeding  [facility], [address].D., BCPS Clinical Pharmacist Pager: [phone] 08/07/2012 8:31 PM

## 2012-08-08 ENCOUNTER — Inpatient Hospital Stay (HOSPITAL_COMMUNITY): Admission: RE | Admit: 2012-08-08 | Payer: 59 | Source: Ambulatory Visit

## 2012-08-08 ENCOUNTER — Inpatient Hospital Stay (HOSPITAL_COMMUNITY): Payer: 59

## 2012-08-08 ENCOUNTER — Encounter (HOSPITAL_COMMUNITY): Payer: Self-pay | Admitting: Cardiology

## 2012-08-08 DIAGNOSIS — N939 Abnormal uterine and vaginal bleeding, unspecified: Secondary | ICD-10-CM | POA: Diagnosis present

## 2012-08-08 DIAGNOSIS — R0600 Dyspnea, unspecified: Secondary | ICD-10-CM | POA: Diagnosis present

## 2012-08-08 DIAGNOSIS — E669 Obesity, unspecified: Secondary | ICD-10-CM | POA: Diagnosis present

## 2012-08-08 HISTORY — PX: TRANSTHORACIC ECHOCARDIOGRAM: SHX275

## 2012-08-08 LAB — PROTIME-INR: INR: 1.09 (ref 0.00–1.49)

## 2012-08-08 LAB — TSH: TSH: 0.368 u[IU]/mL (ref 0.350–4.500)

## 2012-08-08 MED ORDER — WARFARIN SODIUM 7.5 MG PO TABS
7.5000 mg | ORAL_TABLET | Freq: Once | ORAL | Status: AC
  Administered 2012-08-08: 7.5 mg via ORAL
  Filled 2012-08-08: qty 1

## 2012-08-08 MED ORDER — METOPROLOL TARTRATE 25 MG PO TABS
25.0000 mg | ORAL_TABLET | Freq: Two times a day (BID) | ORAL | Status: DC
  Administered 2012-08-08 – 2012-08-10 (×5): 25 mg via ORAL
  Filled 2012-08-08 (×6): qty 1

## 2012-08-08 MED ORDER — IOHEXOL 350 MG/ML SOLN
100.0000 mL | Freq: Once | INTRAVENOUS | Status: AC | PRN
  Administered 2012-08-08: 100 mL via INTRAVENOUS

## 2012-08-08 NOTE — Progress Notes (Signed)
*  PRELIMINARY RESULTS* Echocardiogram 2D Echocardiogram has been performed.  Nicole Gibbs 08/08/2012, 10:06 AM

## 2012-08-08 NOTE — Progress Notes (Signed)
ANTICOAGULATION CONSULT NOTE - Follow Up Consult  Pharmacy Consult for Lovenox and Coumadin Indication: DVT, bilateral; high suspicion for PE  Allergies  Allergen Reactions  . Sulfa Antibiotics Hives    Patient Measurements: Height: 5\' 2"  (157.5 cm) Weight: 231 lb 0.7 oz (104.8 kg) IBW/kg (Calculated) : 50.1  Vital Signs: Temp: 97.4 F (36.3 C) (03/26 0519) Temp src: Oral (03/26 0519) BP: 130/78 mmHg (03/26 0519) Pulse Rate: 94 (03/26 0519)  Labs:  Recent Labs  08/07/12 1929 08/08/12 0540  HGB 12.8  --   HCT 39.3  --   PLT 524*  --   APTT 37  --   LABPROT 14.1 14.0  INR 1.10 1.09  CREATININE 0.62  --   TROPONINI <0.30  --     Estimated Creatinine Clearance: 80.8 ml/min (by C-G formula based on Cr of 0.62).  Assessment:  Overlap day # 2 of 5 minumum Lovenox and Coumadin.  Lovenox 100 mg SQ q12hrs.  Coumadin begun with 7.5 mg last night. INR unchanged.  Chest CT pending.   Patient reports last Medroxyprogesterone injection about 3 weeks ago.  Vaginal bleeding has continued, despite D&C on 06/20/2012, but it has not yet increased since anticoagulants begun 3/25 pm. She will keep [place] informed. Expect no further Medroxyprogesterone will be given, due to thromboembolism. Discussed briefly with L. Kilroy, PA-C. Dr. Vincente Poli to be contacted at some point regarding further management of vaginal bleeding.   Goal of Therapy:  INR 2-3 Anti-Xa level 0.6-1.2 units/ml 4hrs after LMWH dose given Monitor platelets by anticoagulation protocol: Yes   Plan:   Continue Lovenox 100 mg SQ 12hrs.  Will repeat Coumadin 7.5 mg today.  Will follow up for any increased vaginal bleeding, as well as further plans for management.  Dennie Fetters, [place] Pager: [phone] 08/08/2012,1:03 PM

## 2012-08-08 NOTE — Progress Notes (Signed)
PATIENT JUST GOT BACK INTO BED AFTER VDING. IN THE BATHROOM, AND HAD 4 BEATS V-TACH. I WAS WITH HER AND SHE WAS STABLE WITH NO COMPLAINTS. CONT. TO MONITOR PATIENT AND RHYTHM.

## 2012-08-08 NOTE — Progress Notes (Signed)
Subjective:  She still has DOE, no SOB at rest.  Objective:  Vital Signs in the last 24 hours: Temp:  [97.4 F (36.3 C)-98.6 F (37 C)] 97.4 F (36.3 C) (03/26 0519) Pulse Rate:  [94-101] 94 (03/26 0519) Resp:  [18-20] 20 (03/26 0519) BP: (130-154)/(78-96) 130/78 mmHg (03/26 0519) SpO2:  [97 %] 97 % (03/26 0519) Weight:  [104.8 kg (231 lb 0.7 oz)] 104.8 kg (231 lb 0.7 oz) (03/25 1829)  Intake/Output from previous day:  Intake/Output Summary (Last 24 hours) at 08/08/12 1041 Last data filed at 08/08/12 0547  Gross per 24 hour  Intake    600 ml  Output    900 ml  Net   -300 ml   . aspirin EC  81 mg Oral Daily  . enoxaparin (LOVENOX) injection  100 mg Subcutaneous Q12H  . metoprolol tartrate  25 mg Oral BID  . pantoprazole  40 mg Oral Q0600  . verapamil  240 mg Oral Daily  . Warfarin - Pharmacist Dosing Inpatient   Does not apply q1800    Physical Exam: General appearance: alert, cooperative, no distress and morbidly obese Lungs: clear to auscultation bilaterally Heart: regular rate and rhythm 1/6 sem Abd: soft, BS+ EXT: less tender   Rate: 92  Rhythm: normal sinus rhythm and PACs, PVCs, one run of 4 bt SVT  Lab Results:  Recent Labs  08/07/12 1929  WBC 10.6*  HGB 12.8  PLT 524*    Recent Labs  08/07/12 1929  NA 137  K 4.0  CL 104  CO2 21  GLUCOSE 111*  BUN 9  CREATININE 0.62    Recent Labs  08/07/12 1929  TROPONINI <0.30   Hepatic Function Panel  Recent Labs  08/07/12 1929  PROT 7.3  ALBUMIN 3.5  AST 22  ALT 14  ALKPHOS 88  BILITOT 0.4   No results found for this basename: CHOL,  in the last 72 hours  Recent Labs  08/08/12 0540  INR 1.09    Imaging:  CHEST - 2 VIEW  Comparison: None.  Findings: No active infiltrate or effusion is seen. There are  prominent perihilar markings present which may indicate bronchitis.  The heart is mildly enlarged. There are degenerative changes  throughout the thoracic spine.  IMPRESSION:  No  active lung disease. Probable bronchitis. Mild cardiomegaly  Cardiac Studies:  Assessment/Plan:   Principal Problem:   Pulmonary embolism Active Problems:   DVT, bilat - office dopplers 08/07/12   Nonspecific abnormal  (EKG)   Vaginal bleeding- s/p urgent D&C Feb 5th 2014   Dyspnea on exertion   HTN (hypertension)   Family history of coronary artery bypass surgery   Family history of sudden death (sister)   Obesity, unspecified  Plan- Echo is done, result pending. CTA ordered. I'm concerned about vaginal bleeding issue- will discuss with MD. ? Does she need a hysterectomy before she is anticoagulated with Coumadin. Add low dose beta blocker for ectopy.  Corine Shelter PA-C 08/08/2012, 10:41 AM    Patient seen and examined. Agree with assessment and plan. CT apparently has not been done yet? Reason. Pt on full dose anticoagulation. Echo completed; will review. When I entered room, pt was sleeping with very loud snoring; very high liklihood for OSA; will need to schedule for PSG as outpatient.   Lennette Bihari, MD, [facility] 08/08/2012 2:15 PM

## 2012-08-09 ENCOUNTER — Ambulatory Visit (HOSPITAL_COMMUNITY): Admission: RE | Admit: 2012-08-09 | Payer: 59 | Source: Ambulatory Visit

## 2012-08-09 LAB — PROTIME-INR
INR: 1.28 (ref 0.00–1.49)
Prothrombin Time: 15.7 seconds — ABNORMAL HIGH (ref 11.6–15.2)

## 2012-08-09 MED ORDER — WARFARIN SODIUM 10 MG PO TABS
10.0000 mg | ORAL_TABLET | Freq: Once | ORAL | Status: DC
  Filled 2012-08-09: qty 1

## 2012-08-09 NOTE — Progress Notes (Signed)
ANTICOAGULATION CONSULT NOTE - Follow Up Consult  Pharmacy Consult for Lovenox and Coumadin Indication: DVT, bilateral; high suspicion for PE  Allergies  Allergen Reactions  . Sulfa Antibiotics Hives    Patient Measurements: Height: 5\' 2"  (157.5 cm) Weight: 231 lb 0.7 oz (104.8 kg) IBW/kg (Calculated) : 50.1  Vital Signs: Temp: 97.9 F (36.6 C) (03/27 0554) Temp src: Oral (03/27 0554) BP: 143/73 mmHg (03/27 0554) Pulse Rate: 90 (03/27 0554)  Labs:  Recent Labs  08/07/12 1929 08/08/12 0540 08/09/12 0615  HGB 12.8  --   --   HCT 39.3  --   --   PLT 524*  --   --   APTT 37  --   --   LABPROT 14.1 14.0 15.7*  INR 1.10 1.09 1.28  CREATININE 0.62  --   --   TROPONINI <0.30  --   --     Estimated Creatinine Clearance: 80.8 ml/min (by C-G formula based on Cr of 0.62).  Assessment: Overlap day # 3 of 5 minumum Lovenox and Coumadin.  Lovenox 100 mg SQ q12hrs.  INR 1.28 after 2 doses of coumadin 7.5 mg.  Chest CT 3/26 = significant B PE.      Patient reports last Medroxyprogesterone injection about 3 weeks ago.  Vaginal bleeding has continued, despite D&C on 06/20/2012, but it has not yet increased since anticoagulants begun 3/25 pm. She will keep [place] informed. Expect no further Medroxyprogesterone will be given, due to thromboembolism.  Dr. Vincente Poli to be consulted to eval for hormonal tx vs hysterectomy.   Pt mentioned possibility of alternate anticoagulant, xarelto.  Goal of Therapy:  INR 2-3 Anti-Xa level 0.6-1.2 units/ml 4hrs after LMWH dose given Monitor platelets by anticoagulation protocol: Yes   Plan:  Continue Lovenox 100 mg SQ 12hrs. Coumadin 10 mg po x 1 dose today F/u GYN consult  Will follow up for any increased vaginal bleeding, as well as further plans for management.  Herby Abraham, Pharm.D. [phone] 08/09/2012 11:24 AM

## 2012-08-09 NOTE — Progress Notes (Signed)
I spoke with Dr. Vincente Poli, Gynecologist to Nicole Gibbs. She does not feel that a vaginal hysterectomy will be the best option at this point, due to risk.  She is suggesting endometrial ablation. She would like to try this before the patient is fully anticoagulated. Her only concern is that due to the need for anesthesia, Lovenox would need to be held for 1 day.  Will discuss with cardiologist to see if patient is stable to go without anticoagulation for that period of time. Dr. Vincente Poli is anticipating sending the patient to [facility] to undergo the procedure, then transferring back to [facility] to continue with transition to Coumadin. The procedure can possibly be arranged for Monday 3/31.  She has asked me to discuss the plan with a cardiologist. She will seek arrangements at [facility]. She has asked to be contacted again tomorrow, directly at [phone] between 8:30 and 1:00 pm. Will contact pharmacy to see if bridge to Coumadin can be held and to keep on Lovenox. Will get MD input.   Allayne Butcher, PA-C [facility]

## 2012-08-09 NOTE — Progress Notes (Signed)
Pharmacy: anticoagulation for B PE and B DVT Ms. Mcmiller is going to [facility] on Monday for endometrial ablation so coumadin is going to be held and LMWH is going to be continued.  After the procedure she may be transitioned to Xarelto or resumed on coumadin. Herby Abraham, Pharm.D. [phone] 08/09/2012 2:19 PM

## 2012-08-09 NOTE — Progress Notes (Signed)
[facility]  Subjective: Feels mildly short of breath. Denies chest pain and leg pain. Continues to endorse vaginal bleeding.   Objective: Vital signs in last 24 hours: Temp:  [97.5 F (36.4 C)-97.9 F (36.6 C)] 97.9 F (36.6 C) (03/27 0554) Pulse Rate:  [82-90] 90 (03/27 0554) Resp:  [20] 20 (03/27 0554) BP: (126-143)/(73-87) 143/73 mmHg (03/27 0554) SpO2:  [96 %-98 %] 98 % (03/27 0554) Last BM Date: 08/07/12  Intake/Output from previous day: 03/26 0701 - 03/27 0700 In: -  Out: 1300 [Urine:1300] Intake/Output this shift:    Medications Current Facility-Administered Medications  Medication Dose Route Frequency Provider Last Rate Last Dose  . acetaminophen (TYLENOL) tablet 650 mg  650 mg Oral Q4H PRN Abelino Derrick, PA-C      . ALPRAZolam Prudy Feeler) tablet 0.25 mg  0.25 mg Oral TID PRN Abelino Derrick, PA-C      . aspirin EC tablet 81 mg  81 mg Oral Daily Abelino Derrick, PA-C   81 mg at 08/08/12 0951  . enoxaparin (LOVENOX) injection 100 mg  100 mg Subcutaneous Q12H [facility], RPH   100 mg at 08/09/12 0550  . metoprolol tartrate (LOPRESSOR) tablet 25 mg  25 mg Oral BID Abelino Derrick, PA-C   25 mg at 08/08/12 2211  . nitroGLYCERIN (NITROSTAT) SL tablet 0.4 mg  0.4 mg Sublingual Q5 Min x 3 PRN Abelino Derrick, PA-C      . ondansetron [facility]) injection 4 mg  4 mg Intravenous Q6H PRN Eda Paschal Kilroy, PA-C      . pantoprazole (PROTONIX) EC tablet 40 mg  40 mg Oral Q0600 Eda Paschal Kilroy, PA-C   40 mg at 08/09/12 0550  . verapamil (CALAN-SR) CR tablet 240 mg  240 mg Oral Daily Abelino Derrick, PA-C   240 mg at 08/08/12 0951  . Warfarin - Pharmacist Dosing Inpatient   Does not apply q1800 [facility]      . zolpidem [facility]) tablet 5 mg  5 mg Oral QHS PRN Abelino Derrick, PA-C   5 mg at 08/08/12 2211    PE: General appearance: alert, cooperative, no distress and mildly obese Lungs: clear to auscultation bilaterally Heart: regular rate  and rhythm Extremities: + bilatateral LEE, soft and non-tender Pulses: 2+ and symmetric Skin: warm and dry Neurologic: Grossly normal  Lab Results:   Recent Labs  08/07/12 1929  WBC 10.6*  HGB 12.8  HCT 39.3  PLT 524*   BMET  Recent Labs  08/07/12 1929  NA 137  K 4.0  CL 104  CO2 21  GLUCOSE 111*  BUN 9  CREATININE 0.62  CALCIUM 8.8   PT/INR  Recent Labs  08/07/12 1929 08/08/12 0540 08/09/12 0615  LABPROT 14.1 14.0 15.7*  INR 1.10 1.09 1.28    Studies/Results:  CT of Chest 2/26 *RADIOLOGY REPORT*  Clinical Data: Shortness of breath.  CT ANGIOGRAPHY CHEST  Technique: Multidetector CT imaging of the chest using the  standard protocol during bolus administration of intravenous  contrast. Multiplanar reconstructed images including MIPs were  obtained and reviewed to evaluate the vascular anatomy.  Contrast: OMNIPAQUE IOHEXOL 350 MG/ML SOLN  Comparison: None  Findings: The chest wall is unremarkable. No breast masses,  supraclavicular or axillary lymphadenopathy. The thyroid gland is  enlarged, particular the left lobe suggesting thyroid goiter. No  discrete mass. Sonographic correlation from 08/19/2010.  The bony thorax is intact. No destructive bone lesions  or spinal  canal compromise.  The heart is mildly enlarged. No pericardial effusion. No  mediastinal or hilar lymphadenopathy. The scattered lymph nodes  are noted. The esophagus is grossly normal. The aorta is normal  in caliber.  The pulmonary arterial tree is fairly well opacified. There are  significant bilateral pulmonary emboli, right greater than left.  Examination of the lung parenchyma demonstrates breathing motion  artifact. No edema, infiltrates or effusions.  The upper abdomen is grossly normal.  IMPRESSION:  1. Significant bilateral pulmonary emboli.  2. Cardiac enlargement.  3. Normal caliber aorta.  4. No acute pulmonary findings.  Original Report Authenticated By: Rudie Meyer, M.D.   2D Echo 2/26 Study Conclusions  - Left ventricle: The cavity size was normal. Wall thickness was normal. Systolic function was normal. The estimated ejection fraction was in the range of 55% to 60%. Wall motion was normal; there were no regional wall motion abnormalities. Doppler parameters are consistent with abnormal left ventricular relaxation (grade 1 diastolic dysfunction). - Aortic valve: Mildly calcified annulus. Trileaflet; mildly thickened, mildly calcified leaflets. Mild regurgitation. - Mitral valve: Mildly calcified annulus. Mildly thickened and calcified posterior leaflet . - Right ventricle: The cavity size was mildly dilated. Systolic function was mildly reduced. - Atrial septum: No defect or patent foramen ovale was identified. Transthoracic echocardiography. M-mode, complete 2D, spectral Doppler, and color Doppler. Height: Height: 157.5cm. Height: 62in. Weight: Weight: 104.8kg. Weight: 230.6lb. Body mass index: BMI: 42.3kg/m^2. Body surface area: BSA: 2.20m^2. Blood pressure: 130/78. Patient status: Inpatient. Location: Bedside.   Assessment/Plan  Principal Problem:   Pulmonary embolism Active Problems:   DVT, bilat - office dopplers 08/07/12   HTN (hypertension)   Nonspecific abnormal  (EKG)   Family history of coronary artery bypass surgery   Family history of sudden death (sister)   Vaginal bleeding- s/p urgent D&C Feb 5th 2014   Obesity, unspecified   Dyspnea on exertion  Plan: CT of the chest yesterday confirmed significant bilateral pulmonary emboli R>L. Continue with [place] Lovenox with transition to Coumadin. INR today is 1.28. Pharmacy is on board to help with dosing. She appears dyspneic when she talks. Her pulse ox was checked and was 94% on RA. Will place on continuous monitoring. Supplemental O2 is available if needed. Pt continues to endorse heavy vaginal bleeding. Will contact Gynecologist today, Dr. Marcelle Overlie. ? If patient  will need hysterectomy before starting Coumadin. 2D echo revealed normal LV function with an EF of 55-60%. No WMA. + grade I diastolic dysfunction. The RV was mildly dilated with mildly reduced systolic function. The RA was normal. Will continue to monitor. MD to follow with further recommendation.      LOS: 2 days    Brittainy M. Delmer Islam 08/09/2012 7:43 AM    Patient seen and examined. Agree with assessment and plan. CT confirms bilateral PE in this patient with bilateral DVT. She continues to have vaginal bleeding which she states has persisted since Nov 2013. Will ask Dr. Vincente Poli to evaluate for hormonal therapy vs need for hysterectomy in this patient who will need long term anticoagulation treatment. Need to obtain thrombophilia w/u.   Lennette Bihari, MD, [facility] 08/09/2012 9:34 AM

## 2012-08-10 LAB — CBC
HCT: 34.6 % — ABNORMAL LOW (ref 36.0–46.0)
MCH: 25.9 pg — ABNORMAL LOW (ref 26.0–34.0)
MCHC: 32.4 g/dL (ref 30.0–36.0)
MCV: 80.1 fL (ref 78.0–100.0)
RDW: 13.8 % (ref 11.5–15.5)

## 2012-08-10 MED ORDER — METOPROLOL TARTRATE 50 MG PO TABS
50.0000 mg | ORAL_TABLET | Freq: Two times a day (BID) | ORAL | Status: DC
  Administered 2012-08-10 – 2012-08-14 (×8): 50 mg via ORAL
  Filled 2012-08-10 (×10): qty 1

## 2012-08-10 MED ORDER — WARFARIN SODIUM 10 MG PO TABS
10.0000 mg | ORAL_TABLET | Freq: Once | ORAL | Status: AC
  Administered 2012-08-10: 10 mg via ORAL
  Filled 2012-08-10: qty 1

## 2012-08-10 NOTE — Progress Notes (Signed)
Subjective: No complaints currently.  Had SVT early am and then aberrant type tachycardia   Objective: Vital signs in last 24 hours: Temp:  [97.6 F (36.4 C)-98.7 F (37.1 C)] 98.7 F (37.1 C) (03/28 0418) Pulse Rate:  [86-95] 86 (03/28 0418) Resp:  [19-20] 20 (03/28 0418) BP: (129-141)/(74-85) 136/74 mmHg (03/28 0418) SpO2:  [94 %-100 %] 94 % (03/28 0418) Weight change:  Last BM Date: 08/09/12 Intake/Output from previous day: -610 03/27 0701 - 03/28 0700 In: 240 [P.O.:240] Out: 850 [Urine:850] Intake/Output this shift: Total I/O In: 360 [P.O.:360] Out: 400 [Urine:400]  PE: General:alert and oriented, no complaints, mild SOB with talking Heart:S1S2 RRR Lungs:clear no rales or wheezes Abd:+ BS, soft, non tender Ext:no edema  SVT brief but 2 episodes   Lab Results:  Recent Labs  08/07/12 1929 08/10/12 0510  WBC 10.6* 7.8  HGB 12.8 11.2*  HCT 39.3 34.6*  PLT 524* 516*   BMET  Recent Labs  08/07/12 1929  NA 137  K 4.0  CL 104  CO2 21  GLUCOSE 111*  BUN 9  CREATININE 0.62  CALCIUM 8.8    Recent Labs  08/07/12 1929  TROPONINI <0.30     Lab Results  Component Value Date   TSH 0.368 08/07/2012    Hepatic Function Panel  Recent Labs  08/07/12 1929  PROT 7.3  ALBUMIN 3.5  AST 22  ALT 14  ALKPHOS 88  BILITOT 0.4   Studies/Results: Ct Angio Chest Pe W/cm &/or Wo Cm  08/08/2012  *RADIOLOGY REPORT*  Clinical Data: Shortness of breath.  CT ANGIOGRAPHY CHEST  Technique:  Multidetector CT imaging of the chest using the standard protocol during bolus administration of intravenous contrast. Multiplanar reconstructed images including MIPs were obtained and reviewed to evaluate the vascular anatomy.  Contrast: OMNIPAQUE IOHEXOL 350 MG/ML SOLN  Comparison: None  Findings: The chest wall is unremarkable.  No breast masses, supraclavicular or axillary lymphadenopathy.  The thyroid gland is enlarged, particular the left lobe suggesting thyroid goiter.   No discrete mass.  Sonographic correlation from 08/19/2010.  The bony thorax is intact.  No destructive bone lesions or spinal canal compromise.  The heart is mildly enlarged.  No pericardial effusion.  No mediastinal or hilar lymphadenopathy.  The scattered lymph nodes are noted.  The esophagus is grossly normal.  The aorta is normal in caliber.  The pulmonary arterial tree is fairly well opacified.  There are significant bilateral pulmonary emboli, right greater than left.  Examination of the lung parenchyma demonstrates breathing motion artifact.  No edema, infiltrates or effusions.  The upper abdomen is grossly normal.  IMPRESSION:  1.  Significant bilateral pulmonary emboli. 2.  Cardiac enlargement. 3.  Normal caliber aorta. 4.  No acute pulmonary findings.   Original Report Authenticated By: Rudie Meyer, M.D.     Medications: I have reviewed the patient's current medications. Scheduled Meds: . aspirin EC  81 mg Oral Daily  . enoxaparin (LOVENOX) injection  100 mg Subcutaneous Q12H  . metoprolol tartrate  25 mg Oral BID  . pantoprazole  40 mg Oral Q0600  . verapamil  240 mg Oral Daily  . Warfarin - Pharmacist Dosing Inpatient   Does not apply q1800   Continuous Infusions:  PRN Meds:.acetaminophen, ALPRAZolam, nitroGLYCERIN, ondansetron (ZOFRAN) IV, zolpidem  Assessment/Plan: Principal Problem:   Pulmonary embolism, Bilateral Active Problems:   DVT, bilat - office dopplers 08/07/12   HTN (hypertension)   Nonspecific abnormal  (EKG)   Family history of  coronary artery bypass surgery   Family history of sudden death (sister)   Vaginal bleeding- s/p urgent D&C Feb 5th 2014   Obesity, unspecified   Dyspnea on exertion  PLAN:  Plan for transfer to [facility] for endometrial ablation on Monday then back to [facility].   Coumadin on hold/Lovenox continues.  SVT last pm. With sister sudden death at 20 (no Post was done) will need thrombophilia w/u.    Pt with many questions- has not talked  with GYN as of yet.  ? xarelto post procedure VS. Coumadin, will check cost of xarelto for the pt.    LOS: 3 days   Time spent with pt. : 25 minutes. INGOLD,LAURA R 08/10/2012, 10:30 AM  I have seen and evaluated the patient this PM along with Nada Boozer, NP. I agree with her findings, examination as well as impression recommendations.  Confusing situation with +/- full dose anticoagulation -- Vaginal Bleeding & Gyn input.  Initially plan was hold long term AC until post GYN procedure, but now we are hearing that they would prefer + Warfarin therapeutic.  --> restarting warfarin today.   Will need to verify timing of Gyn procedure & when to convert [facility]. -- would be nice to use Xarelto, but not until bleeding issue is fully resolved. Will need thrombophilia w/u - but to get full w/u, need to be off AC --> ? Check after 6 months Rx for PE.  With SVT - will increase BB dose.  Marykay Lex, M.D., M.S. [facility] [address] [place], [place]  [address]  [phone] Pager # [phone] 08/10/2012 5:54 PM

## 2012-08-10 NOTE — Progress Notes (Addendum)
I called and spoke to Dr. Vincente Poli about treatment plan. Then spoke to pt about treatment plan. Pt is agreeable to taking anticoagulants over the weekend and seeing how her vaginal bleeding is affected. She is aware that if her bleeding doesn't get worse, then she may not need the surgery at this time. She will monitor the bleeding over the weekend to report off to Glandorf the PA so that Dr. Vincente Poli can decide if she needs the surgery or not. Will continue to monitor.

## 2012-08-10 NOTE — Progress Notes (Signed)
Pt had episode of SVT with HR in 190s. Pt only complaining of sharp headache that comes and goes, but has no dyspnea or SOB. Pt now back in SR. PA aware. Will continue to monitor.

## 2012-08-10 NOTE — Care Management Note (Unsigned)
    Page 1 of 1   08/10/2012     2:34:19 PM   CARE MANAGEMENT NOTE 08/10/2012  Patient:  Nicole Gibbs   Account Number:  [id number]  Date Initiated:  08/10/2012  Documentation initiated by:  Ludivina Guymon  Subjective/Objective Assessment:   PT ADM ON 08/07/12 WITH BILATERAL PE, DVT AND UTERINE BLEEDING.  PTA, PT INDEPENDENT, LIVES WITH SPOUSE.     Action/Plan:   WILL FOLLOW FOR HOME NEEDS AS PT PROGRESSES.  ENDOMETRIAL ABLATION SCHEDULED FOR 3/31 AT [facility]  WILL LIKELY DC ON XARELTO.   Anticipated DC Date:  08/15/2012   Anticipated DC Plan:  HOME/SELF CARE      DC Planning Services  CM consult      Choice offered to / List presented to:             Status of service:  In process, will continue to follow Medicare Important Message given?   (If response is "NO", the following Medicare IM given date fields will be blank) Date Medicare IM given:   Date Additional Medicare IM given:    Discharge Disposition:    Per UR Regulation:  Reviewed for med. necessity/level of care/duration of stay  If discussed at Long Length of Stay Meetings, dates discussed:    Comments:

## 2012-08-10 NOTE — Progress Notes (Signed)
I spoke with Dr. Vincente Poli again today. She has spoken with an anesthesiologist, who has recommenced to do the endometrial ablation while she is fully anticoagulated on Coumadin. I have contacted pharmacy and have asked to restart Coumadin with Lovenox bridge. Nada Boozer, NP will be in contact with Dr. Vincente Poli over the weekend (her contact is [phone]). Endometrial ablation will now be tentatively planned for Wednesday 4/2 at [facility].   Allayne Butcher, PA-C [facility]

## 2012-08-10 NOTE — Progress Notes (Signed)
ANTICOAGULATION CONSULT NOTE - Follow Up Consult  Pharmacy Consult for Coumadin Indication: DVT, bilateral; high suspicion for PE  Allergies  Allergen Reactions  . Sulfa Antibiotics Hives    Patient Measurements: Height: 5\' 2"  (157.5 cm) Weight: 231 lb 0.7 oz (104.8 kg) IBW/kg (Calculated) : 50.1  Vital Signs: Temp: 98.6 F (37 C) (03/28 1344) Temp src: Oral (03/28 1344) BP: 137/67 mmHg (03/28 1344) Pulse Rate: 90 (03/28 1344)  Labs:  Recent Labs  08/07/12 1929 08/08/12 0540 08/09/12 0615 08/10/12 0510  HGB 12.8  --   --  11.2*  HCT 39.3  --   --  34.6*  PLT 524*  --   --  516*  APTT 37  --   --   --   LABPROT 14.1 14.0 15.7* 15.7*  INR 1.10 1.09 1.28 1.28  CREATININE 0.62  --   --   --   TROPONINI <0.30  --   --   --     Estimated Creatinine Clearance: 80.8 ml/min (by C-G formula based on Cr of 0.62).  Assessment: Overlap day # 3 of 5 minumum Lovenox and Coumadin for B/L PE and DVT.  Spoke with Robbie Lis, PA and she has asked pharmacy to resume Coumadin for this patient. Plan is for patient to have an endometrial ablation on 4/2 while she is fully anticoagulated on Coumadin. INR is subtherapeutic. H/H are low, platelets are wnl.  Goal of Therapy:  INR 2-3 Anti-Xa level 0.6-1.2 units/ml 4hrs after LMWH dose given Monitor platelets by anticoagulation protocol: Yes   Plan:  -Continue Lovenox 100 mg SQ q12h as previously ordered -Coumadin 10 mg po today -Monitor for signs/symptoms of bleeding  [facility], [address].D., BCPS Clinical Pharmacist Pager: [phone] 08/10/2012 4:27 PM

## 2012-08-11 LAB — BASIC METABOLIC PANEL
CO2: 23 mEq/L (ref 19–32)
Calcium: 8.5 mg/dL (ref 8.4–10.5)
Chloride: 107 mEq/L (ref 96–112)
Creatinine, Ser: 0.61 mg/dL (ref 0.50–1.10)
Glucose, Bld: 90 mg/dL (ref 70–99)

## 2012-08-11 MED ORDER — WARFARIN SODIUM 10 MG PO TABS
10.0000 mg | ORAL_TABLET | Freq: Once | ORAL | Status: AC
  Administered 2012-08-11: 10 mg via ORAL
  Filled 2012-08-11 (×2): qty 1

## 2012-08-11 NOTE — Progress Notes (Signed)
Subjective:  No CP/SOB  Objective:  Temp:  [97.9 F (36.6 C)-98.6 F (37 C)] 97.9 F (36.6 C) (03/29 0436) Pulse Rate:  [77-90] 77 (03/29 0436) Resp:  [18] 18 (03/29 0436) BP: (130-142)/(62-71) 142/62 mmHg (03/29 0436) SpO2:  [95 %-97 %] 95 % (03/29 0436) Weight:  [104.7 kg (230 lb 13.2 oz)] 104.7 kg (230 lb 13.2 oz) (03/29 0436) Weight change:   Intake/Output from previous day: 03/28 0701 - 03/29 0700 In: 600 [P.O.:600] Out: 1250 [Urine:1250]  Intake/Output from this shift:    Physical Exam: General appearance: alert and no distress Neck: no adenopathy, no carotid bruit, no JVD, supple, symmetrical, trachea midline and thyroid not enlarged, symmetric, no tenderness/mass/nodules Lungs: clear to auscultation bilaterally Heart: regular rate and rhythm, S1, S2 normal, no murmur, click, rub or gallop Extremities: extremities normal, atraumatic, no cyanosis or edema  Lab Results: Results for orders placed during the hospital encounter of 08/07/12 (from the past 48 hour(s))  CBC     Status: Abnormal   Collection Time    08/10/12  5:10 AM      Result Value Range   WBC 7.8  4.0 - 10.5 K/uL   RBC 4.32  3.87 - 5.11 MIL/uL   Hemoglobin 11.2 (*) 12.0 - 15.0 g/dL   HCT 16.1 (*) 09.6 - 04.5 %   MCV 80.1  78.0 - 100.0 fL   MCH 25.9 (*) 26.0 - 34.0 pg   MCHC 32.4  30.0 - 36.0 g/dL   RDW 40.9  81.1 - 91.4 %   Platelets 516 (*) 150 - 400 K/uL  PROTIME-INR     Status: Abnormal   Collection Time    08/10/12  5:10 AM      Result Value Range   Prothrombin Time 15.7 (*) 11.6 - 15.2 seconds   INR 1.28  0.00 - 1.49  BASIC METABOLIC PANEL     Status: None   Collection Time    08/11/12  5:00 AM      Result Value Range   Sodium 140  135 - 145 mEq/L   Potassium 3.6  3.5 - 5.1 mEq/L   Chloride 107  96 - 112 mEq/L   CO2 23  19 - 32 mEq/L   Glucose, Bld 90  70 - 99 mg/dL   BUN 9  6 - 23 mg/dL   Creatinine, Ser 7.82  0.50 - 1.10 mg/dL   Calcium 8.5  8.4 - 95.6 mg/dL   GFR calc non Af  Amer >90  >90 mL/min   GFR calc Af Amer >90  >90 mL/min   Comment:            The eGFR has been calculated     using the CKD EPI equation.     This calculation has not been     validated in all clinical     situations.     eGFR's persistently     <90 mL/min signify     possible Chronic Kidney Disease.  MAGNESIUM     Status: None   Collection Time    08/11/12  5:00 AM      Result Value Range   Magnesium 2.2  1.5 - 2.5 mg/dL  PROTIME-INR     Status: Abnormal   Collection Time    08/11/12  5:00 AM      Result Value Range   Prothrombin Time 16.8 (*) 11.6 - 15.2 seconds   INR 1.40  0.00 - 1.49    Imaging: Imaging  results have been reviewed  Assessment/Plan:   1. Principal Problem: 2.   Pulmonary embolism, Bilateral 3. Active Problems: 4.   DVT, bilat - office dopplers 08/07/12 5.   HTN (hypertension) 6.   Nonspecific abnormal  (EKG) 7.   Family history of coronary artery bypass surgery 8.   Family history of sudden death (sister) 20.   Vaginal bleeding- s/p urgent D&C Feb 5th 2014 10.   Obesity, unspecified 11.   Dyspnea on exertion 12.   Time Spent Directly with Patient:  20 minutes  Length of Stay:  LOS: 4 days   Pt with bilateral pulm emboli and bilateral tibial vein DVTs. LV fxn nl although RV mildly dilated and hypokinetic. Getting Lovenox and Coumadin AC per pharmacy. Plan is to fully anticoagulate to INR 2.5-3.0, then D/C Lovenox and assess for vaginal bleeding. Dr. Vincente Poli aware. Exam benign. I don't think thrombophilia w/u was done prior to initiating coumadin.   Nicole Gibbs 08/11/2012, 9:32 AM

## 2012-08-11 NOTE — Progress Notes (Signed)
ANTICOAGULATION CONSULT NOTE - Follow Up Consult  Pharmacy Consult for Lovenox and Coumadin Indication: pulmonary embolus and DVT, both bilateral  Patient Measurements: Height: 5\' 2"  (157.5 cm) Weight: 230 lb 13.2 oz (104.7 kg) IBW/kg (Calculated) : 50.1  Vital Signs: Temp: 98 F (36.7 C) (03/29 1411) Temp src: Oral (03/29 1411) BP: 136/92 mmHg (03/29 1411) Pulse Rate: 80 (03/29 1411)  Labs:  Recent Labs  08/09/12 0615 08/10/12 0510 08/11/12 0500  HGB  --  11.2*  --   HCT  --  34.6*  --   PLT  --  516*  --   LABPROT 15.7* 15.7* 16.8*  INR 1.28 1.28 1.40  CREATININE  --   --  0.61    Estimated Creatinine Clearance: 80.6 ml/min (by C-G formula based on Cr of 0.61).  Assessment:  Overlap day # 4 of 5 minumum Lovenox and Coumadin.  Continues on Lovenox 100 mg SQ q12hrs.    INR increasing slowly.  Coumadin 7.5 mg given 3/25 & 3/26, held 3/27 with plans for procedure 3/31, then resumed 3/28 with 10 mg, with plans for possible endometrial ablation on 4/2, while on therapeutic Coumadin.  She has questions about Xarelto, and whether she may be tranistioned to Xarelto at some point.   She reports no vaginal bleeding 3/28, and minimal today.  Goal of Therapy:  INR 2-3, but prefer 2.5-3.0 per Cardiology Monitor platelets by anticoagulation protocol: Yes   Plan:   Continue Lovenox 100 mg SQ 12hrs.  Will repeat Coumadin 10 mg today.  Will follow up for any further vaginal bleeding.  Dennie Fetters, RPh Pager: [phone] 08/11/2012,3:55 PM

## 2012-08-12 LAB — PROTIME-INR
INR: 1.87 — ABNORMAL HIGH (ref 0.00–1.49)
Prothrombin Time: 20.8 seconds — ABNORMAL HIGH (ref 11.6–15.2)

## 2012-08-12 MED ORDER — WARFARIN SODIUM 10 MG PO TABS
10.0000 mg | ORAL_TABLET | Freq: Once | ORAL | Status: AC
  Administered 2012-08-12: 10 mg via ORAL
  Filled 2012-08-12: qty 1

## 2012-08-12 NOTE — Progress Notes (Signed)
Subjective: No complaints, did have small amount vaginal bleeding  Objective: Vital signs in last 24 hours: Temp:  [97.5 F (36.4 C)-98.7 F (37.1 C)] 98.7 F (37.1 C) (03/30 0451) Pulse Rate:  [80-100] 100 (03/30 0451) Resp:  [18-20] 20 (03/30 0451) BP: (132-139)/(74-92) 132/76 mmHg (03/30 0451) SpO2:  [95 %-98 %] 97 % (03/30 0451) Weight:  [104.101 kg (229 lb 8 oz)] 104.101 kg (229 lb 8 oz) (03/30 0451) Weight change: -0.6 kg (-1 lb 5.2 oz) Last BM Date: 08/11/12 Intake/Output from previous day:   Intake/Output this shift:   Per Dr. Allyson Sabal PE: General:alert and oriented, pleasant affect Heart:S1S2 RRR Lungs:clear without rales or rhonchi Abd:+ BS, soft non tender Ext:no edema    Lab Results:  Recent Labs  08/10/12 0510  WBC 7.8  HGB 11.2*  HCT 34.6*  PLT 516*   BMET  Recent Labs  08/11/12 0500  NA 140  K 3.6  CL 107  CO2 23  GLUCOSE 90  BUN 9  CREATININE 0.61  CALCIUM 8.5      Lab Results  Component Value Date   TSH 0.368 08/07/2012      Studies/Results: No results found.  Medications: I have reviewed the patient's current medications. Scheduled Meds: . aspirin EC  81 mg Oral Daily  . enoxaparin (LOVENOX) injection  100 mg Subcutaneous Q12H  . metoprolol tartrate  50 mg Oral BID  . pantoprazole  40 mg Oral Q0600  . verapamil  240 mg Oral Daily  . Warfarin - Pharmacist Dosing Inpatient   Does not apply q1800   Continuous Infusions:  PRN Meds:.acetaminophen, ALPRAZolam, nitroGLYCERIN, ondansetron (ZOFRAN) IV, zolpidem  Assessment/Plan: Principal Problem:   Pulmonary embolism, Bilateral Active Problems:   DVT, bilat - office dopplers 08/07/12   HTN (hypertension)   Nonspecific abnormal  (EKG), negative nucluear stress test 08/07/12   Family history of coronary artery bypass surgery   Family history of sudden death (sister)   Vaginal bleeding- s/p urgent D&C Feb 5th 2014   Obesity, unspecified   Dyspnea on exertion  PLAN: INR 1.87, sm  amt vaginal bleeding.  Continue coumadin and lovenox crossover.     LOS: 5 days   Time spent with pt. Including MD time  15  minutes. INGOLD,LAURA R 08/12/2012, 9:00 AM   Agree with note written by Nada Boozer RNP  Getting Lovenox and coumadin for DVT/VTE. INR 1.87. Minimal vaginal bleeding at this time but still sub therapeutic. Dr. Vincente Poli aware.  Runell Gess 08/12/2012 9:59 AM

## 2012-08-12 NOTE — Progress Notes (Signed)
ANTICOAGULATION CONSULT NOTE - Follow Up Consult  Pharmacy Consult for Lovenox and Coumadin Indication: pulmonary embolus and DVT, both bilateral  Patient Measurements: Height: 5\' 2"  (157.5 cm) Weight: 229 lb 8 oz (104.101 kg) IBW/kg (Calculated) : 50.1  Vital Signs: Temp: 98.1 F (36.7 C) (03/30 1412) Temp src: Oral (03/30 1412) BP: 120/69 mmHg (03/30 1412) Pulse Rate: 83 (03/30 1412)  Labs:  Recent Labs  08/10/12 0510 08/11/12 0500 08/12/12 0643  HGB 11.2*  --   --   HCT 34.6*  --   --   PLT 516*  --   --   LABPROT 15.7* 16.8* 20.8*  INR 1.28 1.40 1.87*  CREATININE  --  0.61  --     Estimated Creatinine Clearance: 80.4 ml/min (by C-G formula based on Cr of 0.61).  Assessment:  Overlap day # 5 of 5 minumum Lovenox and Coumadin.  Continues on Lovenox 100 mg SQ q12hrs.    INR increasing towards goal.  Coumadin 7.5 mg given 3/25 & 3/26, held 3/27 with plans for procedure 3/31, then resumed 3/28 with 10 mg, repeated 10 mg on 3/29.  For possible endometrial ablation on 4/2, while on therapeutic Coumadin. Discussed Coumadin use, monitoring, precautions and potential drug-drug and drug-food interactions. She had questions about Xarelto, and whether she may be tranistioned to Xarelto at some point.   Prior heavy vaginal bleeding, despite D&C on 06/20/12. Had been on medroxyprogesterone injections, last ~3 weeks ago. Expect no further injections, with current thromboembolism. She reports no vaginal bleeding 3/28,  minimal 3/29, and minimal again today.   Goal of Therapy:  INR 2-3, but prefer 2.5-3.0 per Cardiology Monitor platelets by anticoagulation protocol: Yes   Plan:   Continue Lovenox 100 mg SQ 12hrs.  Will repeat Coumadin 10 mg today.  Will follow up for any further vaginal bleeding.  Dennie Fetters, [place] Pager: [phone] 08/12/2012,4:56 PM

## 2012-08-13 LAB — CBC
HCT: 38.3 % (ref 36.0–46.0)
Hemoglobin: 11.7 g/dL — ABNORMAL LOW (ref 12.0–15.0)
RDW: 13.8 % (ref 11.5–15.5)
WBC: 8.4 10*3/uL (ref 4.0–10.5)

## 2012-08-13 LAB — PROTIME-INR
INR: 2.73 — ABNORMAL HIGH (ref 0.00–1.49)
Prothrombin Time: 27.6 seconds — ABNORMAL HIGH (ref 11.6–15.2)

## 2012-08-13 MED ORDER — WARFARIN SODIUM 2.5 MG PO TABS
2.5000 mg | ORAL_TABLET | Freq: Once | ORAL | Status: AC
  Administered 2012-08-13: 2.5 mg via ORAL
  Filled 2012-08-13: qty 1

## 2012-08-13 NOTE — Progress Notes (Signed)
ANTICOAGULATION CONSULT NOTE - Follow Up Consult  Pharmacy Consult for Lovenox and Coumadin Indication: pulmonary embolus and DVT, both bilateral  Labs:  Recent Labs  08/11/12 0500 08/12/12 0643 08/13/12 0520  HGB  --   --  11.7*  HCT  --   --  38.3  PLT  --   --  544*  LABPROT 16.8* 20.8* 27.6*  INR 1.40 1.87* 2.73*  CREATININE 0.61  --   --     Estimated Creatinine Clearance: 80.4 ml/min (by C-G formula based on Cr of 0.61).  Assessment: Overlap day # 6 of 5 minumum Lovenox and Coumadin. Continues on Lovenox 100 mg SQ q12hrs.   INR is therapeutic at 2.73 (huge jump in INR, from 1.87)  For possible endometrial ablation on 4/2, while on therapeutic Coumadin.   Discussed Coumadin use, monitoring, precautions and potential drug-drug and drug-food interactions. She had questions about Xarelto, and whether she may be tranistioned to Xarelto at some point.  Prior heavy vaginal bleeding, despite D&C on 06/20/12. Had been on medroxyprogesterone injections, last ~3 weeks ago. Expect no further injections, with current thromboembolism. She reports no vaginal bleeding 3/28, minimal vaginal bleeding today  ? Plan to go home  Goal of Therapy:  INR 2-3, but prefer 2.5-3.0 per Cardiology Monitor platelets by anticoagulation protocol: Yes   Plan:  Continue Lovenox 100 mg SQ 12hrs for now, discontinue in AM if INR therapeutic or prior to dc  Decrease Coumadin to 2.5 mg po x 1 today due to jump in INR.  If home today, recommend 2.5 mg today, with 7.5 mg tomorrow and follow up INR on Wednesday  Will follow up for any further vaginal bleeding.  Thank you. Okey Regal, PharmD Pager: [phone] 08/13/2012,9:11 AM

## 2012-08-13 NOTE — Progress Notes (Signed)
[facility]  Subjective: SOB has improved. Denies CP.  Objective: Vital signs in last 24 hours: Temp:  [97.5 F (36.4 C)-98.1 F (36.7 C)] 97.5 F (36.4 C) (03/31 0416) Pulse Rate:  [77-85] 77 (03/31 0416) Resp:  [18-20] 18 (03/31 0416) BP: (106-123)/(69-74) 106/73 mmHg (03/31 0416) SpO2:  [95 %-99 %] 95 % (03/31 0416) Weight:  [229 lb 3.2 oz (103.964 kg)] 229 lb 3.2 oz (103.964 kg) (03/31 0416) Last BM Date: 08/13/12  Intake/Output from previous day:   Intake/Output this shift:    Medications Current Facility-Administered Medications  Medication Dose Route Frequency Provider Last Rate Last Dose  . acetaminophen (TYLENOL) tablet 650 mg  650 mg Oral Q4H PRN Abelino Derrick, PA-C      . ALPRAZolam Prudy Feeler) tablet 0.25 mg  0.25 mg Oral TID PRN Abelino Derrick, PA-C      . aspirin EC tablet 81 mg  81 mg Oral Daily Luke K Kilroy, PA-C   81 mg at 08/12/12 1001  . enoxaparin (LOVENOX) injection 100 mg  100 mg Subcutaneous Q12H Maryanna Shape [place], [place]   100 mg at 08/13/12 7829  . metoprolol (LOPRESSOR) tablet 50 mg  50 mg Oral BID Marykay Lex, MD   50 mg at 08/12/12 2135  . nitroGLYCERIN (NITROSTAT) SL tablet 0.4 mg  0.4 mg Sublingual Q5 Min x 3 PRN Eda Paschal Kilroy, PA-C      . ondansetron [facility] injection 4 mg  4 mg Intravenous Q6H PRN Eda Paschal Kilroy, PA-C      . pantoprazole (PROTONIX) EC tablet 40 mg  40 mg Oral Q0600 Abelino Derrick, PA-C   40 mg at 08/13/12 5621  . verapamil (CALAN-SR) CR tablet 240 mg  240 mg Oral Daily Abelino Derrick, PA-C   240 mg at 08/12/12 1001  . Warfarin - Pharmacist Dosing Inpatient   Does not apply q1800 [facility]      . zolpidem [facility]) tablet 5 mg  5 mg Oral QHS PRN Abelino Derrick, PA-C   5 mg at 08/12/12 2159    PE: General appearance: alert, cooperative, no distress and moderately obese Lungs: clear to auscultation bilaterally Heart: regular rate and rhythm Extremities: no LEE Pulses: 2+ and  symmetric Skin: warm and dry Neurologic: Grossly normal  Lab Results:   Recent Labs  08/13/12 0520  WBC 8.4  HGB 11.7*  HCT 38.3  PLT 544*   BMET  Recent Labs  08/11/12 0500  NA 140  K 3.6  CL 107  CO2 23  GLUCOSE 90  BUN 9  CREATININE 0.61  CALCIUM 8.5   PT/INR  Recent Labs  08/11/12 0500 08/12/12 0643 08/13/12 0520  LABPROT 16.8* 20.8* 27.6*  INR 1.40 1.87* 2.73*    Assessment/Plan  Principal Problem:   Pulmonary embolism, Bilateral Active Problems:   DVT, bilat - office dopplers 08/07/12   HTN (hypertension)   Nonspecific abnormal  (EKG), negative nucluear stress test 08/07/12   Family history of coronary artery bypass surgery   Family history of sudden death (sister)   Vaginal bleeding- s/p urgent D&C Feb 5th 2014   Obesity, unspecified   Dyspnea on exertion  Plan: SOB has improved. No CP. INR is now therapeutic at 2.73. Continue with Coumadin with INR goal of 2.5-3.0. Pt still having a small amount of vaginal bleeding. Can consider discharging home today and following up with Dr. Vincente Poli as an OP to discuss endometrial ablation vs alternative  non-invasive treatments.     LOS: 6 days    Nicole Gibbs 08/13/2012 8:31 AM   Patient seen and examined. Agree with assessment and plan. Will plan for 1-2 day overlap with lovenox/coumadin once INR therapeutic in light of significant bilateral PEx burden. Breathing better. No chest pain or leg discomfort. Minimal vaginal bleeding "6 drops."  Possible DC tomorrow.   Lennette Bihari, MD, [facility] 08/13/2012 9:07 AM

## 2012-08-14 LAB — URINALYSIS, ROUTINE W REFLEX MICROSCOPIC
Glucose, UA: NEGATIVE mg/dL
Ketones, ur: NEGATIVE mg/dL
Protein, ur: NEGATIVE mg/dL
Urobilinogen, UA: 1 mg/dL (ref 0.0–1.0)

## 2012-08-14 LAB — PROTIME-INR: Prothrombin Time: 29.1 seconds — ABNORMAL HIGH (ref 11.6–15.2)

## 2012-08-14 LAB — URINE MICROSCOPIC-ADD ON

## 2012-08-14 MED ORDER — ASPIRIN 81 MG PO TBEC: 81.0000 mg | DELAYED_RELEASE_TABLET | Freq: Every day | ORAL | Status: DC

## 2012-08-14 MED ORDER — CIPROFLOXACIN HCL 250 MG PO TABS: 250.0000 mg | ORAL_TABLET | Freq: Two times a day (BID) | ORAL | Status: DC

## 2012-08-14 MED ORDER — WARFARIN SODIUM 7.5 MG PO TABS: 7.5000 mg | ORAL_TABLET | Freq: Every day | ORAL | Status: DC

## 2012-08-14 MED ORDER — NITROGLYCERIN 0.4 MG SL SUBL: 0.4000 mg | SUBLINGUAL_TABLET | SUBLINGUAL | Status: DC | PRN

## 2012-08-14 MED ORDER — METOPROLOL TARTRATE 50 MG PO TABS: 50.0000 mg | ORAL_TABLET | Freq: Two times a day (BID) | ORAL | Status: DC

## 2012-08-14 MED ORDER — WARFARIN SODIUM 2.5 MG PO TABS
2.5000 mg | ORAL_TABLET | Freq: Once | ORAL | Status: AC
  Administered 2012-08-14: 2.5 mg via ORAL
  Filled 2012-08-14: qty 1

## 2012-08-14 NOTE — Progress Notes (Signed)
ANTICOAGULATION CONSULT NOTE - Follow Up Consult  Pharmacy Consult for Lovenox and Coumadin Indication: pulmonary embolus and DVT, both bilateral  Labs:  Recent Labs  08/12/12 0643 08/13/12 0520 08/14/12 0510  HGB  --  11.7*  --   HCT  --  38.3  --   PLT  --  544*  --   LABPROT 20.8* 27.6* 29.1*  INR 1.87* 2.73* 2.94*    Estimated Creatinine Clearance: 80.4 ml/min (by C-G formula based on Cr of 0.61).  Assessment: Overlap day # 7 of 5 minumum Lovenox and Coumadin. Continues on Lovenox 100 mg SQ q12hrs.   INR is therapeutic at 2.94  Goal of Therapy:  INR 2-3, but prefer 2.5-3.0 per Cardiology Monitor platelets by anticoagulation protocol: Yes   Plan:  1) DC Lovenox 2) Repeat Coumadin 2.5 mg po x 1 today 3) If home today, recommend 7.5 mg po daily with follow up INR on Friday  Thank you. Okey Regal, PharmD Pager: [phone] 08/14/2012,10:00 AM

## 2012-08-14 NOTE — Progress Notes (Signed)
[facility]  Subjective: Breathing continues to improve. No chest pain. + dysuria since last night.   Objective: Vital signs in last 24 hours: Temp:  [97.3 F (36.3 C)-98.7 F (37.1 C)] 97.3 F (36.3 C) (04/01 0647) Pulse Rate:  [76-87] 76 (04/01 0647) Resp:  [18] 18 (04/01 0647) BP: (111-140)/(63-76) 137/63 mmHg (04/01 0647) SpO2:  [96 %] 96 % (04/01 0647) Weight:  [229 lb 8 oz (104.1 kg)] 229 lb 8 oz (104.1 kg) (04/01 0647) Last BM Date: 08/13/12  Intake/Output from previous day: 03/31 0701 - 04/01 0700 In: 720 [P.O.:720] Out: 0  Intake/Output this shift:    Medications Current Facility-Administered Medications  Medication Dose Route Frequency Provider Last Rate Last Dose  . acetaminophen (TYLENOL) tablet 650 mg  650 mg Oral Q4H PRN Abelino Derrick, PA-C      . ALPRAZolam Prudy Feeler) tablet 0.25 mg  0.25 mg Oral TID PRN Abelino Derrick, PA-C      . aspirin EC tablet 81 mg  81 mg Oral Daily Abelino Derrick, PA-C   81 mg at 08/13/12 1125  . enoxaparin (LOVENOX) injection 100 mg  100 mg Subcutaneous Q12H [facility] [place], [place]   100 mg at 08/14/12 4098  . metoprolol (LOPRESSOR) tablet 50 mg  50 mg Oral BID Marykay Lex, MD   50 mg at 08/13/12 2158  . nitroGLYCERIN (NITROSTAT) SL tablet 0.4 mg  0.4 mg Sublingual Q5 Min x 3 PRN Eda Paschal Kilroy, PA-C      . ondansetron [facility]) injection 4 mg  4 mg Intravenous Q6H PRN Eda Paschal Kilroy, PA-C      . pantoprazole (PROTONIX) EC tablet 40 mg  40 mg Oral Q0600 Abelino Derrick, PA-C   40 mg at 08/14/12 1191  . verapamil (CALAN-SR) CR tablet 240 mg  240 mg Oral Daily Abelino Derrick, PA-C   240 mg at 08/13/12 1125  . Warfarin - Pharmacist Dosing Inpatient   Does not apply q1800 [facility], [facility]      . zolpidem [facility]) tablet 5 mg  5 mg Oral QHS PRN Abelino Derrick, PA-C   5 mg at 08/13/12 2158    PE: General appearance: alert, cooperative, no distress and moderately obese Lungs: clear to auscultation  bilaterally Heart: regular rate and rhythm Extremities: no LEE Pulses: 2+ and symmetric Skin: wam and dry Neurologic: Grossly normal  Lab Results:   Recent Labs  08/13/12 0520  WBC 8.4  HGB 11.7*  HCT 38.3  PLT 544*   BMET No results found for this basename: NA, K, CL, CO2, GLUCOSE, BUN, CREATININE, CALCIUM,  in the last 72 hours PT/INR  Recent Labs  08/12/12 0643 08/13/12 0520 08/14/12 0510  LABPROT 20.8* 27.6* 29.1*  INR 1.87* 2.73* 2.94*    Assessment/Plan  Principal Problem:   Pulmonary embolism, Bilateral Active Problems:   DVT, bilat - office dopplers 08/07/12   HTN (hypertension)   Nonspecific abnormal  (EKG), negative nucluear stress test 08/07/12   Family history of coronary artery bypass surgery   Family history of sudden death (sister)   Vaginal bleeding- s/p urgent D&C Feb 5th 2014   Obesity, unspecified   Dyspnea on exertion  Plan: INR remains therapeutic at 2.94. Will need to resume Coumadin therapy at home with INR goal of 2.5-3.0. Will arrange for patient to follow-up at [facility] for INR clinic. HR and BP both stable. Consider discharging home today. Per Dr. Vincente Poli, the patient will  follow-up as an OP to discuss endometrial ablation vs other noninvasive therapies. Pt also endorsing dysuria. Will order U/A. If UTI, will treat with antibiotics as OP. MD to follow.     LOS: 7 days    Brittainy M. Delmer Islam 08/14/2012 7:44 AM  Agree with note written by Boyce Medici  [facility]  OK for D/C. INR theraputic. No CP/SOB. Exam benign. No signif vaginal bleeding. ROV with [facility] 1-2 weeks then me there after. Needs early follow up with Kristen as well in coumadin clinic.   Runell Gess 08/14/2012 9:01 AM

## 2012-08-14 NOTE — Discharge Summary (Signed)
Physician Discharge Summary  Patient ID: Nicole Gibbs MRN: [id number] DOB/AGE: [birth year] 67 y.o.  Admit date: 08/07/2012 Discharge date: 08/14/2012  Admission Diagnoses: Pulmonary Embolism, Bitateral  Discharge Diagnoses:  Principal Problem:   Pulmonary embolism, Bilateral Active Problems:   DVT, bilat - office dopplers 08/07/12   HTN (hypertension)   Nonspecific abnormal  (EKG), negative nucluear stress test 08/07/12   Family history of coronary artery bypass surgery   Family history of sudden death (sister)   Vaginal bleeding- s/p urgent D&C Feb 5th 2014   Obesity, unspecified   Dyspnea on exertion   Discharged Condition: stable  Hospital Course: The patient is a 67 y.o. moderately overweight female who was referred to Dr. Allyson Sabal, by Dr. Marcelle Overlie, OB/GYN, for evaluation of an abnormal EKG and fairly rapidly-progressing shortness of breath of unclear etiology. She apparently was having vaginal bleeding and was scheduled to undergo a D&C, and was noted by the anesthesiologist to have an abnormal EKG.  The patient had also recently started medroxyprogesterone injections to help control her bleeding. In the office labs were obtained and bilateral lower extremity dopplers were performed. She had a D-dimer elevated >5. Her dopplers were positive for bilateral lower extremity DVT.  There was high suspision for pulmonary embolism. She was given Lovenox in the office and was admitted directly to [facility]. A CTA of the chest was ordered, which confirmed significant bilateral pulmonary emboli. She was started on Coumadin with a Lovenox bridge. Her therapeutic INR goal was 2.5-3.0. Pharmacy was consulted to help with dosing. The patient was also noted to have ectopy on telemetry and was placed on a low dose of beta blocker. She also continued to endorse vaginal bleeding. Dr. Vincente Poli was notified. It was agreed to have her follow up as an outpatient to discuss endometrial ablation vs other noninvasive  treatments. On hospital day 6, she reached a therapeutic INR level of 2.73. It was decided to keep her for an additional day for overlap with Lovenox/Coumadin. On day 7, her INR was 2.94. Her shortness of breath had improved significantly. The patient did however endorse dysuria. A urinalysis suggested a UTI. She was placed on a 7 day course of ciprofloxacin. She was evaluated by pharmacy who recommended continuing with Coumadin at a dose of 7.5 mg po daily with follow up INR in 3 days post discharge. The patient was last seen and examined by Dr. Allyson Sabal, who felt she was stable for discharge home. She is scheduled for an IRN check at [facility] on 08/17/12. She will continue with Coumadin indefinetly and will have periodic INR monitoring at [facility]. She is scheduled to follow-up with Nada Boozer, NP at [facility] on 08/27/12.    Consults: None  Significant Diagnostic Studies:   CT Anigo of the Chest 2012-08-17 *RADIOLOGY REPORT*  Clinical Data: Shortness of breath.  CT ANGIOGRAPHY CHEST  Technique: Multidetector CT imaging of the chest using the  standard protocol during bolus administration of intravenous  contrast. Multiplanar reconstructed images including MIPs were  obtained and reviewed to evaluate the vascular anatomy.  Contrast: OMNIPAQUE IOHEXOL 350 MG/ML SOLN  Comparison: None  Findings: The chest wall is unremarkable. No breast masses,  supraclavicular or axillary lymphadenopathy. The thyroid gland is  enlarged, particular the left lobe suggesting thyroid goiter. No  discrete mass. Sonographic correlation from 08/19/2010.  The bony thorax is intact. No destructive bone lesions or spinal  canal compromise.  The heart is mildly enlarged. No pericardial effusion. No  mediastinal or hilar lymphadenopathy.  The scattered lymph nodes  are noted. The esophagus is grossly normal. The aorta is normal  in caliber.  The pulmonary arterial tree is fairly well opacified. There are  significant bilateral  pulmonary emboli, right greater than left.  Examination of the lung parenchyma demonstrates breathing motion  artifact. No edema, infiltrates or effusions.  The upper abdomen is grossly normal.  IMPRESSION:  1. Significant bilateral pulmonary emboli.  2. Cardiac enlargement.  3. Normal caliber aorta.  4. No acute pulmonary findings.  Original Report Authenticated By: Rudie Meyer, M.D.    Treatments: See Hospital Course  Discharge Exam: Blood pressure 137/63, pulse 76, temperature 97.3 F (36.3 C), temperature source Oral, resp. rate 18, height 5\' 2"  (1.575 m), weight 229 lb 8 oz (104.1 kg), SpO2 96.00%.   Disposition: 01-Home or Self Care  Discharge Orders   Future Orders Complete By Expires     Diet - low sodium heart healthy  As directed     Increase activity slowly  As directed         Medication List    TAKE these medications       aspirin 81 MG EC tablet  Take 1 tablet (81 mg total) by mouth daily.     ciprofloxacin 250 MG tablet  Commonly known as:  CIPRO  Take 1 tablet (250 mg total) by mouth 2 (two) times daily.     ibuprofen 200 MG tablet  Commonly known as:  ADVIL,MOTRIN  Take 200 mg by mouth 2 (two) times daily as needed for pain.     medroxyPROGESTERone 150 MG/ML injection  Commonly known as:  DEPO-PROVERA  Inject 150 mg into the muscle every 3 (three) months.     metoprolol 50 MG tablet  Commonly known as:  LOPRESSOR  Take 1 tablet (50 mg total) by mouth 2 (two) times daily.     nitroGLYCERIN 0.4 MG SL tablet  Commonly known as:  NITROSTAT  Place 1 tablet (0.4 mg total) under the tongue every 5 (five) minutes x 3 doses as needed for chest pain.     PRESCRIPTION MEDICATION  Apply 1 application topically daily as needed (NYSTOP POWDER.  Apply underneath breasts.).     VERAPAMIL HCL PO  Take 1 tablet by mouth daily. Patient not sure of strength,pharmacy did not have on file - sometimes husband gets through mail order     warfarin 7.5 MG tablet   Commonly known as:  COUMADIN  Take 1 tablet (7.5 mg total) by mouth daily.  Start taking on:  08/15/2012     zolpidem 10 MG tablet  Commonly known as:  AMBIEN  Take 5-10 mg by mouth at bedtime as needed for sleep.           Follow-up Information   Follow up with [facility] R, NP On 08/27/2012. (11:00 am)    Contact information:   [address] [place] [address] [phone]       Follow up with [facility] On 08/17/2012. (INR Check with Kristen 11:20am)    Contact information:   [address] [place] [address] [phone]     TIME SPENT ON DISCHARGE, INCLUDING PHYSICIAN TIME: > 30 MINUTES  Signed: Allayne Butcher, PA-C 08/14/2012, 11:27 AM

## 2012-08-15 ENCOUNTER — Ambulatory Visit: Admit: 2012-08-15 | Payer: Self-pay | Admitting: Obstetrics and Gynecology

## 2012-08-15 SURGERY — DILATATION & CURETTAGE/HYSTEROSCOPY WITH THERMACHOICE ABLATION
Anesthesia: Choice

## 2012-08-16 LAB — URINE CULTURE: Colony Count: >=100000

## 2012-08-20 ENCOUNTER — Encounter: Payer: Self-pay | Admitting: Pharmacist Clinician (PhC)/ Clinical Pharmacy Specialist

## 2012-08-20 DIAGNOSIS — I2699 Other pulmonary embolism without acute cor pulmonale: Secondary | ICD-10-CM

## 2012-08-20 DIAGNOSIS — Z7901 Long term (current) use of anticoagulants: Secondary | ICD-10-CM

## 2012-08-20 DIAGNOSIS — I82403 Acute embolism and thrombosis of unspecified deep veins of lower extremity, bilateral: Secondary | ICD-10-CM

## 2012-08-27 ENCOUNTER — Encounter (HOSPITAL_BASED_OUTPATIENT_CLINIC_OR_DEPARTMENT_OTHER): Payer: Self-pay | Admitting: *Deleted

## 2012-08-28 NOTE — Progress Notes (Signed)
When pt called for preop assessment, she stated " I'm so nervous" .  No cardiac clearance received.  Pt saw PA @ [facility] 4/14.  No rders for coumadin continuance.  Dr. Drucie Opitz office closed. Left message for Herbert Seta, [place] scheduler for Dr. Adalberto Ill regarding needed clearance with coumadin orders.

## 2012-09-03 ENCOUNTER — Encounter (HOSPITAL_BASED_OUTPATIENT_CLINIC_OR_DEPARTMENT_OTHER): Payer: Self-pay | Admitting: *Deleted

## 2012-09-03 NOTE — Progress Notes (Addendum)
NPO AFTER MN. ARRIVES AT 0600. NEEDS PT/INR, CBC, BMET.  CURRENT CHEST CT AND EKG IN EPIC AND CHART. WILL TAKE METOPROLOL AND VERAPAMIL AM OF SURG W/ SIPS OF WATER,  RECEIVED CARDIAC CLEARANCE FROM DR BERRY, PT IS LOW RISK AND IS TO CONTINUE COUMADIN (WHICH PT HAS), W/ CHART. REQUESTING LOV NOTE. REVIEWED CHART W/ DR DENNENY, STATES PT SEEEMED TO BE OKAY BUT WILL BE  EVALUATED DOS.

## 2012-09-03 NOTE — H&P (Signed)
67 year old female with heavy post menopausal bleeding here for D and C, hysteroscopy and thermachoice endometrial ablation. She recently had very heavy bleeding and had an urgent D and C - pathology showed polypoid endometrium. She did well for a few weeks post op then started bleeding again. She was given Depo Provera for this. Subsequently developed bilateral DVTs and pulmonary emboli.   Afebrile Vital signs stable See med list Allergic to Sulfa  General alert and oriented Lung CTAB Car RRR Abdomen is soft and non tender  IMPRESSION: Post menopausal bleeding  PLAN: D and C Hysteroscopy Thermachoice endometrial ablation

## 2012-09-04 ENCOUNTER — Encounter (HOSPITAL_BASED_OUTPATIENT_CLINIC_OR_DEPARTMENT_OTHER): Payer: Self-pay | Admitting: *Deleted

## 2012-09-04 ENCOUNTER — Encounter (HOSPITAL_BASED_OUTPATIENT_CLINIC_OR_DEPARTMENT_OTHER): Payer: Self-pay | Admitting: Anesthesiology

## 2012-09-04 ENCOUNTER — Ambulatory Visit (HOSPITAL_BASED_OUTPATIENT_CLINIC_OR_DEPARTMENT_OTHER)
Admission: RE | Admit: 2012-09-04 | Discharge: 2012-09-04 | Disposition: A | Discharge status: Home / Self Care | Payer: 59 | Source: Ambulatory Visit | Attending: Obstetrics and Gynecology | Admitting: Obstetrics and Gynecology

## 2012-09-04 ENCOUNTER — Ambulatory Visit (HOSPITAL_BASED_OUTPATIENT_CLINIC_OR_DEPARTMENT_OTHER): Payer: 59 | Admitting: Anesthesiology

## 2012-09-04 ENCOUNTER — Encounter (HOSPITAL_BASED_OUTPATIENT_CLINIC_OR_DEPARTMENT_OTHER)
Admission: RE | Disposition: A | Discharge status: Home / Self Care | Payer: Self-pay | Source: Ambulatory Visit | Attending: Obstetrics and Gynecology

## 2012-09-04 DIAGNOSIS — Z882 Allergy status to sulfonamides status: Secondary | ICD-10-CM | POA: Insufficient documentation

## 2012-09-04 DIAGNOSIS — N939 Abnormal uterine and vaginal bleeding, unspecified: Secondary | ICD-10-CM

## 2012-09-04 DIAGNOSIS — I2699 Other pulmonary embolism without acute cor pulmonale: Secondary | ICD-10-CM

## 2012-09-04 DIAGNOSIS — Z8489 Family history of other specified conditions: Secondary | ICD-10-CM

## 2012-09-04 DIAGNOSIS — E669 Obesity, unspecified: Secondary | ICD-10-CM

## 2012-09-04 DIAGNOSIS — Z86718 Personal history of other venous thrombosis and embolism: Secondary | ICD-10-CM | POA: Insufficient documentation

## 2012-09-04 DIAGNOSIS — R0600 Dyspnea, unspecified: Secondary | ICD-10-CM

## 2012-09-04 DIAGNOSIS — Z86711 Personal history of pulmonary embolism: Secondary | ICD-10-CM | POA: Insufficient documentation

## 2012-09-04 DIAGNOSIS — N950 Postmenopausal bleeding: Secondary | ICD-10-CM

## 2012-09-04 DIAGNOSIS — I82403 Acute embolism and thrombosis of unspecified deep veins of lower extremity, bilateral: Secondary | ICD-10-CM

## 2012-09-04 DIAGNOSIS — Z8249 Family history of ischemic heart disease and other diseases of the circulatory system: Secondary | ICD-10-CM

## 2012-09-04 DIAGNOSIS — Z7901 Long term (current) use of anticoagulants: Secondary | ICD-10-CM

## 2012-09-04 DIAGNOSIS — I10 Essential (primary) hypertension: Secondary | ICD-10-CM

## 2012-09-04 DIAGNOSIS — R9431 Abnormal electrocardiogram [ECG] [EKG]: Secondary | ICD-10-CM

## 2012-09-04 HISTORY — PX: DILITATION & CURRETTAGE/HYSTROSCOPY WITH THERMACHOICE ABLATION: SHX5569

## 2012-09-04 HISTORY — DX: Postmenopausal bleeding: N95.0

## 2012-09-04 HISTORY — DX: Unspecified osteoarthritis, unspecified site: M19.90

## 2012-09-04 HISTORY — DX: Rash and other nonspecific skin eruption: R21

## 2012-09-04 HISTORY — DX: Nocturia: R35.1

## 2012-09-04 HISTORY — DX: Other pulmonary embolism without acute cor pulmonale: I26.99

## 2012-09-04 HISTORY — DX: Acute embolism and thrombosis of unspecified deep veins of lower extremity, bilateral: I82.403

## 2012-09-04 LAB — BASIC METABOLIC PANEL
BUN: 12 mg/dL (ref 6–23)
Calcium: 9.1 mg/dL (ref 8.4–10.5)
GFR calc non Af Amer: >90 mL/min (ref >90)
Glucose, Bld: 114 mg/dL — ABNORMAL HIGH (ref 70–99)
Sodium: 142 mEq/L (ref 135–145)

## 2012-09-04 LAB — TYPE AND SCREEN: ABO/RH(D): O POS

## 2012-09-04 LAB — PROTIME-INR: Prothrombin Time: 21 seconds — ABNORMAL HIGH (ref 11.6–15.2)

## 2012-09-04 LAB — CBC
Hemoglobin: 12.2 g/dL (ref 12.0–15.0)
MCH: 24.7 pg — ABNORMAL LOW (ref 26.0–34.0)
MCHC: 31.5 g/dL (ref 30.0–36.0)

## 2012-09-04 LAB — ABO/RH: ABO/RH(D): O POS

## 2012-09-04 SURGERY — DILATATION & CURETTAGE/HYSTEROSCOPY WITH THERMACHOICE ABLATION
Anesthesia: Monitor Anesthesia Care | Site: Vagina | Wound class: Clean Contaminated

## 2012-09-04 MED ORDER — DEXAMETHASONE SODIUM PHOSPHATE 4 MG/ML IJ SOLN
INTRAMUSCULAR | Status: DC | PRN
  Administered 2012-09-04: 10 mg via INTRAVENOUS

## 2012-09-04 MED ORDER — CEFAZOLIN SODIUM-DEXTROSE 2-3 GM-% IV SOLR
2.0000 g | INTRAVENOUS | Status: AC
  Administered 2012-09-04: 2 g via INTRAVENOUS
  Filled 2012-09-04: qty 50

## 2012-09-04 MED ORDER — ONDANSETRON HCL 4 MG/2ML IJ SOLN
INTRAMUSCULAR | Status: DC | PRN
  Administered 2012-09-04: 4 mg via INTRAVENOUS

## 2012-09-04 MED ORDER — DEXTROSE 5 % IV SOLN
INTRAVENOUS | Status: DC | PRN
  Administered 2012-09-04: 50 mL

## 2012-09-04 MED ORDER — LACTATED RINGERS IV SOLN
INTRAVENOUS | Status: DC
  Administered 2012-09-04 (×2): via INTRAVENOUS
  Filled 2012-09-04: qty 1000

## 2012-09-04 MED ORDER — OXYCODONE HCL 5 MG PO TABS: 5.0000 mg | ORAL_TABLET | ORAL | Status: DC | PRN

## 2012-09-04 MED ORDER — ACETAMINOPHEN 10 MG/ML IV SOLN
INTRAVENOUS | Status: DC | PRN
  Administered 2012-09-04: 1000 mg via INTRAVENOUS

## 2012-09-04 MED ORDER — PROPOFOL 10 MG/ML IV EMUL
INTRAVENOUS | Status: DC | PRN
  Administered 2012-09-04: 25 ug/kg/min via INTRAVENOUS

## 2012-09-04 MED ORDER — FENTANYL CITRATE 0.05 MG/ML IJ SOLN
INTRAMUSCULAR | Status: DC | PRN
  Administered 2012-09-04: 100 ug via INTRAVENOUS

## 2012-09-04 MED ORDER — LACTATED RINGERS IV SOLN
INTRAVENOUS | Status: DC
  Filled 2012-09-04: qty 1000

## 2012-09-04 MED ORDER — PROMETHAZINE HCL 25 MG/ML IJ SOLN
6.2500 mg | INTRAMUSCULAR | Status: DC | PRN
  Filled 2012-09-04: qty 1

## 2012-09-04 MED ORDER — OXYCODONE HCL 5 MG PO TABS
5.0000 mg | ORAL_TABLET | ORAL | Status: AC | PRN
  Administered 2012-09-04: 5 mg via ORAL
  Filled 2012-09-04: qty 1

## 2012-09-04 MED ORDER — FENTANYL CITRATE 0.05 MG/ML IJ SOLN
25.0000 ug | INTRAMUSCULAR | Status: DC | PRN
  Administered 2012-09-04 (×2): 25 ug via INTRAVENOUS
  Filled 2012-09-04: qty 1

## 2012-09-04 MED ORDER — LIDOCAINE HCL 1 % IJ SOLN
INTRAMUSCULAR | Status: DC | PRN
  Administered 2012-09-04: 20 mL

## 2012-09-04 MED ORDER — MIDAZOLAM HCL 5 MG/5ML IJ SOLN
INTRAMUSCULAR | Status: DC | PRN
  Administered 2012-09-04: 2 mg via INTRAVENOUS

## 2012-09-04 SURGICAL SUPPLY — 29 items
BAG DECANTER FOR FLEXI CONT (MISCELLANEOUS) ×2 IMPLANT
CANISTER SUCTION 2500CC (MISCELLANEOUS) ×2 IMPLANT
CATH ROBINSON RED A/P 16FR (CATHETERS) ×2 IMPLANT
CATH THERMACHOICE III (CATHETERS) ×2 IMPLANT
CLOTH BEACON ORANGE TIMEOUT ST (SAFETY) ×2 IMPLANT
COVER TABLE BACK 60X90 (DRAPES) ×2 IMPLANT
DRAPE LG THREE QUARTER DISP (DRAPES) ×2 IMPLANT
DRAPE UNDERBUTTOCKS STRL (DRAPE) IMPLANT
DRESSING TELFA 8X3 (GAUZE/BANDAGES/DRESSINGS) ×2 IMPLANT
ELECT REM PT RETURN 9FT ADLT (ELECTROSURGICAL) ×2
ELECTRODE REM PT RTRN 9FT ADLT (ELECTROSURGICAL) ×1 IMPLANT
GLOVE BIO SURGEON STRL SZ 6 (GLOVE) ×2 IMPLANT
GLOVE BIO SURGEON STRL SZ 6.5 (GLOVE) ×6 IMPLANT
GLYCINE 1.5% IRRIG UROMATIC (IV SOLUTION) IMPLANT
GOWN STRL NON-REIN LRG LVL3 (GOWN DISPOSABLE) ×2 IMPLANT
GOWN STRL REIN XL XLG (GOWN DISPOSABLE) IMPLANT
IV LACTATED RINGERS 500ML (IV SOLUTION) IMPLANT
LEGGING LITHOTOMY PAIR STRL (DRAPES) ×2 IMPLANT
NEEDLE SPNL 22GX3.5 QUINCKE BK (NEEDLE) IMPLANT
NS IRRIG 500ML POUR BTL (IV SOLUTION) ×2 IMPLANT
PACK BASIN DAY SURGERY FS (CUSTOM PROCEDURE TRAY) ×2 IMPLANT
PAD OB MATERNITY 4.3X12.25 (PERSONAL CARE ITEMS) ×2 IMPLANT
SYR CONTROL 10ML LL (SYRINGE) ×2 IMPLANT
TOWEL OR 17X24 6PK STRL BLUE (TOWEL DISPOSABLE) ×4 IMPLANT
TRAY DSU PREP LF (CUSTOM PROCEDURE TRAY) ×2 IMPLANT
TUBING HYDROFLEX HYSTEROSCOPY (TUBING) IMPLANT
UNDERPAD 30X30 INCONTINENT (UNDERPADS AND DIAPERS) ×2 IMPLANT
WATER STERILE IRR 1000ML POUR (IV SOLUTION) IMPLANT
WATER STERILE IRR 500ML POUR (IV SOLUTION) IMPLANT

## 2012-09-04 NOTE — Anesthesia Preprocedure Evaluation (Addendum)
Anesthesia Evaluation  Patient identified by MRN, date of birth, ID band Patient awake    Reviewed: Allergy & Precautions, H&P , NPO status , Patient's Chart, lab work & pertinent test results  Airway Mallampati: II TM Distance: <3 FB Neck ROM: Full    Dental no notable dental hx.    Pulmonary PE breath sounds clear to auscultation  Pulmonary exam normal       Cardiovascular hypertension, DVT Rhythm:Regular Rate:Normal     Neuro/Psych negative neurological ROS  negative psych ROS   GI/Hepatic negative GI ROS, Neg liver ROS,   Endo/Other  negative endocrine ROSMorbid obesity  Renal/GU negative Renal ROS  negative genitourinary   Musculoskeletal negative musculoskeletal ROS (+)   Abdominal   Peds negative pediatric ROS (+)  Hematology negative hematology ROS (+)   Anesthesia Other Findings   Reproductive/Obstetrics negative OB ROS                           Anesthesia Physical Anesthesia Plan  ASA: III  Anesthesia Plan: MAC   Post-op Pain Management:    Induction: Intravenous  Airway Management Planned: Simple Face Mask  Additional Equipment:   Intra-op Plan:   Post-operative Plan:   Informed Consent: I have reviewed the patients History and Physical, chart, labs and discussed the procedure including the risks, benefits and alternatives for the proposed anesthesia with the patient or authorized representative who has indicated his/her understanding and acceptance.     Plan Discussed with: CRNA and Surgeon  Anesthesia Plan Comments:         Anesthesia Quick Evaluation

## 2012-09-04 NOTE — Brief Op Note (Signed)
09/04/2012  8:28 AM  PATIENT:  Nicole Gibbs  67 y.o. female  PRE-OPERATIVE DIAGNOSIS:  postmenopausal bleeding  POST-OPERATIVE DIAGNOSIS:  postmenopausal bleeding  PROCEDURE:  Procedure(s): DILATATION & CURETTAGE WITH THERMACHOICE ABLATION (N/A)  SURGEON:  Surgeon(s) and Role:    * Jeani Hawking, MD - Primary  PHYSICIAN ASSISTANT:   ASSISTANTS: none   ANESTHESIA:   IV sedation and paracervical block  EBL:  Total I/O In: 200 [I.V.:200] Out: -   BLOOD ADMINISTERED:none  DRAINS: none   LOCAL MEDICATIONS USED:  XYLOCAINE   SPECIMEN:  Source of Specimen:  uterine currettings  DISPOSITION OF SPECIMEN:  PATHOLOGY  COUNTS:  YES  TOURNIQUET:  * No tourniquets in log *  DICTATION: .Other Dictation: Dictation Number dictated  PLAN OF CARE: Discharge to home after PACU  PATIENT DISPOSITION:  PACU - hemodynamically stable.   Delay start of Pharmacological VTE agent (>24hrs) due to surgical blood loss or risk of bleeding: not applicable

## 2012-09-04 NOTE — Transfer of Care (Signed)
Immediate Anesthesia Transfer of Care Note  Patient: Nicole Gibbs  Procedure(s) Performed: Procedure(s): DILATATION & CURETTAGE WITH THERMACHOICE ABLATION (N/A)  Patient Location: PACU  Anesthesia Type:MAC  Level of Consciousness: awake and patient cooperative  Airway & Oxygen Therapy: Patient Spontanous Breathing and Patient connected to face mask oxygen  Post-op Assessment: Report given to PACU RN  Post vital signs: Reviewed and stable  Complications: No apparent anesthesia complications

## 2012-09-04 NOTE — Progress Notes (Signed)
Patient had very heavy bleeding last night. Afebrile vss Will proceed with D and C, possible hysteroscopy, thermachoice endometrial ablation  Consent signed

## 2012-09-04 NOTE — Op Note (Signed)
[name], [name]                 ACCOUNT NO.:  [id number]  MEDICAL RECORD NO.:  [id number]  LOCATION:                                 FACILITY:  PHYSICIAN:  Wladyslawa Disbro L. Jullian Clayson, M.D.DATE OF BIRTH:  1946-04-22  DATE OF PROCEDURE:  09/04/2012 DATE OF DISCHARGE:                              OPERATIVE REPORT   PREOPERATIVE DIAGNOSES: 1. Postmenopausal bleeding. 2. Recent diagnosis of bilateral deep venous thrombosis with pulmonary     embolism, currently on active anticoagulation.  POSTOPERATIVE DIAGNOSES: 1. Postmenopausal bleeding. 2. Recent diagnosis of bilateral deep venous thrombosis with pulmonary     embolism, currently on active anticoagulation.  PROCEDURE:  D and C and ThermaChoice endometrial ablation.  SURGEON:  Fredrik Mogel L. Vincente Poli, M.D.  ANESTHESIA:  IV sedation with paracervical block.  EBL:  Minimal.  COMPLICATIONS:  None.  PATHOLOGY:  Uterine curettings.  PROCEDURE:  The patient had been thoroughly consented about the procedure.  She was consented about the risks, which include infection, uterine perforation, injury to surrounding organs with thermal burn, and she consented to proceed.  The patient was taken to the operating room, and her anesthesia was then administered without difficulty.  She was then placed in the lithotomy position.  She was prepped and draped. Time-out was performed.  Speculum was inserted to the vagina.  The cervix was grasped with a tenaculum.  There was noted to be a large amount of clot in the vagina.  The patient did report to me this morning that she had excessively heavy bleeding last night.  The clots were removed and a paracervical block was performed in standard fashion with an injection at 2 o'clock, 5 o'clock, 7 o'clock, and 10 o'clock with using 20 mL of local.  Pratt dilator was used to dilate the cervix.  The sharp curette was inserted and the uterus was thoroughly curetted.  It was noted to be 11 weeks' size.  Moderate  amount of clots were removed and some old clots were removed.  Polyp forceps were inserted, the tissue inside was then removed.  The ThermaChoice balloon was inserted and a ThermaChoice balloon ablation was performed.  The uterine cavity was large and in order to get to her maximal pressure of 150, I had to use a total of 65 mL of D5W.  Because of the amount of fluid in the balloon, we had to go through 5 total cycles of preheating and I was not able to get up to a temperature greater than 83 degrees Celsius, however, the pressure was very stable and I knew that there was no concern of uterine perforation because pressure stayed between 140 and 150, so of the 5 cycles of preheating, 3 of the cycles the temperature ranged between 70 and 83 degrees Celsius.  I felt like she had a good ablation because the total time of preheating was approximately 20 minutes.  The intact balloon was removed at the end.  There was no bleeding whatsoever.  The patient went to recovery room in stable condition and she will be discharged time from there, she will follow up with me in 1 week.     Ibraham Levi L. Vincente Poli, M.D.  MLG/MEDQ  D:  09/04/2012  T:  09/04/2012  Job:  [id number]

## 2012-09-04 NOTE — Anesthesia Postprocedure Evaluation (Signed)
  Anesthesia Post-op Note  Patient: Nicole Gibbs  Procedure(s) Performed: Procedure(s) (LRB): DILATATION & CURETTAGE WITH THERMACHOICE ABLATION (N/A)  Patient Location: PACU  Anesthesia Type: MAC  Level of Consciousness: awake and alert   Airway and Oxygen Therapy: Patient Spontanous Breathing  Post-op Pain: mild  Post-op Assessment: Post-op Vital signs reviewed, Patient's Cardiovascular Status Stable, Respiratory Function Stable, Patent Airway and No signs of Nausea or vomiting  Last Vitals:  Filed Vitals:   09/04/12 0845  BP: 91/72  Pulse: 70  Temp:   Resp: 17    Post-op Vital Signs: stable   Complications: No apparent anesthesia complications

## 2012-09-05 ENCOUNTER — Encounter (HOSPITAL_BASED_OUTPATIENT_CLINIC_OR_DEPARTMENT_OTHER): Payer: Self-pay | Admitting: Obstetrics and Gynecology

## 2012-10-10 ENCOUNTER — Ambulatory Visit (INDEPENDENT_AMBULATORY_CARE_PROVIDER_SITE_OTHER): Payer: 59 | Admitting: Pharmacist Clinician (PhC)/ Clinical Pharmacy Specialist

## 2012-10-10 VITALS — BP 140/70 | HR 88

## 2012-10-10 DIAGNOSIS — I2699 Other pulmonary embolism without acute cor pulmonale: Secondary | ICD-10-CM

## 2012-10-10 DIAGNOSIS — I82403 Acute embolism and thrombosis of unspecified deep veins of lower extremity, bilateral: Secondary | ICD-10-CM

## 2012-10-10 DIAGNOSIS — Z7901 Long term (current) use of anticoagulants: Secondary | ICD-10-CM

## 2012-10-10 DIAGNOSIS — I82409 Acute embolism and thrombosis of unspecified deep veins of unspecified lower extremity: Secondary | ICD-10-CM

## 2012-10-22 DIAGNOSIS — F4323 Adjustment disorder with mixed anxiety and depressed mood: Secondary | ICD-10-CM | POA: Diagnosis not present

## 2012-11-12 ENCOUNTER — Ambulatory Visit (INDEPENDENT_AMBULATORY_CARE_PROVIDER_SITE_OTHER): Payer: Medicare Other | Admitting: Pharmacist Clinician (PhC)/ Clinical Pharmacy Specialist

## 2012-11-12 VITALS — BP 170/90 | HR 92

## 2012-11-12 DIAGNOSIS — I82409 Acute embolism and thrombosis of unspecified deep veins of unspecified lower extremity: Secondary | ICD-10-CM | POA: Diagnosis not present

## 2012-11-12 DIAGNOSIS — I2699 Other pulmonary embolism without acute cor pulmonale: Secondary | ICD-10-CM

## 2012-11-12 DIAGNOSIS — I82403 Acute embolism and thrombosis of unspecified deep veins of lower extremity, bilateral: Secondary | ICD-10-CM

## 2012-11-12 DIAGNOSIS — Z7901 Long term (current) use of anticoagulants: Secondary | ICD-10-CM | POA: Diagnosis not present

## 2012-11-29 ENCOUNTER — Telehealth: Payer: Self-pay | Admitting: Cardiovascular Disease

## 2012-12-11 ENCOUNTER — Ambulatory Visit (INDEPENDENT_AMBULATORY_CARE_PROVIDER_SITE_OTHER): Payer: Medicare Other | Admitting: Pharmacist Clinician (PhC)/ Clinical Pharmacy Specialist

## 2012-12-11 DIAGNOSIS — I82403 Acute embolism and thrombosis of unspecified deep veins of lower extremity, bilateral: Secondary | ICD-10-CM

## 2012-12-11 DIAGNOSIS — Z7901 Long term (current) use of anticoagulants: Secondary | ICD-10-CM | POA: Diagnosis not present

## 2012-12-11 DIAGNOSIS — I82409 Acute embolism and thrombosis of unspecified deep veins of unspecified lower extremity: Secondary | ICD-10-CM | POA: Diagnosis not present

## 2012-12-11 DIAGNOSIS — I2699 Other pulmonary embolism without acute cor pulmonale: Secondary | ICD-10-CM | POA: Diagnosis not present

## 2012-12-11 LAB — POCT INR: INR: 2.4

## 2012-12-31 ENCOUNTER — Telehealth (HOSPITAL_COMMUNITY): Payer: Self-pay | Admitting: Cardiovascular Disease

## 2013-01-09 ENCOUNTER — Ambulatory Visit (INDEPENDENT_AMBULATORY_CARE_PROVIDER_SITE_OTHER): Payer: Medicare Other | Admitting: Pharmacist Clinician (PhC)/ Clinical Pharmacy Specialist

## 2013-01-09 DIAGNOSIS — I2699 Other pulmonary embolism without acute cor pulmonale: Secondary | ICD-10-CM

## 2013-01-09 DIAGNOSIS — Z7901 Long term (current) use of anticoagulants: Secondary | ICD-10-CM | POA: Diagnosis not present

## 2013-01-09 DIAGNOSIS — I82409 Acute embolism and thrombosis of unspecified deep veins of unspecified lower extremity: Secondary | ICD-10-CM

## 2013-01-09 DIAGNOSIS — I82403 Acute embolism and thrombosis of unspecified deep veins of lower extremity, bilateral: Secondary | ICD-10-CM

## 2013-01-09 LAB — POCT INR: INR: 1.9

## 2013-02-06 ENCOUNTER — Ambulatory Visit (INDEPENDENT_AMBULATORY_CARE_PROVIDER_SITE_OTHER): Payer: Medicare Other | Admitting: Pharmacist Clinician (PhC)/ Clinical Pharmacy Specialist

## 2013-02-06 VITALS — BP 168/90 | HR 76

## 2013-02-06 DIAGNOSIS — I82409 Acute embolism and thrombosis of unspecified deep veins of unspecified lower extremity: Secondary | ICD-10-CM

## 2013-02-06 DIAGNOSIS — I82403 Acute embolism and thrombosis of unspecified deep veins of lower extremity, bilateral: Secondary | ICD-10-CM

## 2013-02-06 DIAGNOSIS — I2699 Other pulmonary embolism without acute cor pulmonale: Secondary | ICD-10-CM

## 2013-02-06 DIAGNOSIS — Z7901 Long term (current) use of anticoagulants: Secondary | ICD-10-CM | POA: Diagnosis not present

## 2013-02-06 LAB — POCT INR: INR: 2.5

## 2013-03-06 ENCOUNTER — Ambulatory Visit (INDEPENDENT_AMBULATORY_CARE_PROVIDER_SITE_OTHER): Payer: Medicare Other | Admitting: Pharmacist Clinician (PhC)/ Clinical Pharmacy Specialist

## 2013-03-06 VITALS — BP 156/86 | HR 80

## 2013-03-06 DIAGNOSIS — I82409 Acute embolism and thrombosis of unspecified deep veins of unspecified lower extremity: Secondary | ICD-10-CM

## 2013-03-06 DIAGNOSIS — I82403 Acute embolism and thrombosis of unspecified deep veins of lower extremity, bilateral: Secondary | ICD-10-CM

## 2013-03-06 DIAGNOSIS — Z7901 Long term (current) use of anticoagulants: Secondary | ICD-10-CM

## 2013-03-06 DIAGNOSIS — I2699 Other pulmonary embolism without acute cor pulmonale: Secondary | ICD-10-CM | POA: Diagnosis not present

## 2013-03-06 LAB — POCT INR: INR: 2.8

## 2013-03-11 ENCOUNTER — Ambulatory Visit: Payer: Medicare Other | Admitting: Cardiovascular Disease

## 2013-03-19 ENCOUNTER — Other Ambulatory Visit: Payer: Self-pay | Admitting: *Deleted

## 2013-03-19 DIAGNOSIS — Z79899 Other long term (current) drug therapy: Secondary | ICD-10-CM

## 2013-03-19 DIAGNOSIS — I2782 Chronic pulmonary embolism: Secondary | ICD-10-CM

## 2013-03-27 ENCOUNTER — Ambulatory Visit (HOSPITAL_COMMUNITY)
Admission: RE | Admit: 2013-03-27 | Discharge: 2013-03-27 | Disposition: A | Discharge status: Home / Self Care | Payer: Medicare Other | Source: Ambulatory Visit | Attending: Cardiovascular Disease | Admitting: Cardiovascular Disease

## 2013-03-27 DIAGNOSIS — I517 Cardiomegaly: Secondary | ICD-10-CM | POA: Insufficient documentation

## 2013-03-27 DIAGNOSIS — I82409 Acute embolism and thrombosis of unspecified deep veins of unspecified lower extremity: Secondary | ICD-10-CM | POA: Insufficient documentation

## 2013-03-27 DIAGNOSIS — R0602 Shortness of breath: Secondary | ICD-10-CM | POA: Diagnosis not present

## 2013-03-27 DIAGNOSIS — I059 Rheumatic mitral valve disease, unspecified: Secondary | ICD-10-CM | POA: Insufficient documentation

## 2013-03-27 DIAGNOSIS — Z86711 Personal history of pulmonary embolism: Secondary | ICD-10-CM | POA: Diagnosis not present

## 2013-03-27 DIAGNOSIS — I2782 Chronic pulmonary embolism: Secondary | ICD-10-CM

## 2013-03-27 DIAGNOSIS — J9819 Other pulmonary collapse: Secondary | ICD-10-CM | POA: Diagnosis not present

## 2013-03-27 DIAGNOSIS — Z7901 Long term (current) use of anticoagulants: Secondary | ICD-10-CM | POA: Insufficient documentation

## 2013-03-27 DIAGNOSIS — E042 Nontoxic multinodular goiter: Secondary | ICD-10-CM | POA: Insufficient documentation

## 2013-03-27 LAB — BASIC METABOLIC PANEL
Chloride: 104 mEq/L (ref 96–112)
Creatinine, Ser: 0.58 mg/dL (ref 0.50–1.10)
GFR calc Af Amer: >90 mL/min (ref >90)
GFR calc non Af Amer: >90 mL/min (ref >90)
Potassium: 4.2 mEq/L (ref 3.5–5.1)
Sodium: 139 mEq/L (ref 135–145)

## 2013-03-27 MED ORDER — IOHEXOL 350 MG/ML SOLN
100.0000 mL | Freq: Once | INTRAVENOUS | Status: AC | PRN
  Administered 2013-03-27: 100 mL via INTRAVENOUS

## 2013-04-03 ENCOUNTER — Other Ambulatory Visit: Payer: Self-pay | Admitting: Pharmacist Clinician (PhC)/ Clinical Pharmacy Specialist

## 2013-04-03 ENCOUNTER — Ambulatory Visit (INDEPENDENT_AMBULATORY_CARE_PROVIDER_SITE_OTHER): Payer: Medicare Other | Admitting: Pharmacist Clinician (PhC)/ Clinical Pharmacy Specialist

## 2013-04-03 VITALS — BP 158/90 | HR 72

## 2013-04-03 DIAGNOSIS — I82409 Acute embolism and thrombosis of unspecified deep veins of unspecified lower extremity: Secondary | ICD-10-CM | POA: Diagnosis not present

## 2013-04-03 DIAGNOSIS — Z7901 Long term (current) use of anticoagulants: Secondary | ICD-10-CM

## 2013-04-03 DIAGNOSIS — I2699 Other pulmonary embolism without acute cor pulmonale: Secondary | ICD-10-CM | POA: Diagnosis not present

## 2013-04-03 DIAGNOSIS — I82403 Acute embolism and thrombosis of unspecified deep veins of lower extremity, bilateral: Secondary | ICD-10-CM

## 2013-04-03 LAB — POCT INR: INR: 1.8

## 2013-04-03 MED ORDER — WARFARIN SODIUM 3 MG PO TABS: ORAL_TABLET | ORAL | Status: DC

## 2013-04-17 ENCOUNTER — Ambulatory Visit (INDEPENDENT_AMBULATORY_CARE_PROVIDER_SITE_OTHER): Payer: Medicare Other | Admitting: Cardiovascular Disease

## 2013-04-17 ENCOUNTER — Telehealth: Payer: Self-pay | Admitting: Cardiovascular Disease

## 2013-04-17 ENCOUNTER — Ambulatory Visit (INDEPENDENT_AMBULATORY_CARE_PROVIDER_SITE_OTHER): Payer: Medicare Other | Admitting: Pharmacist Clinician (PhC)/ Clinical Pharmacy Specialist

## 2013-04-17 ENCOUNTER — Encounter: Payer: Self-pay | Admitting: Cardiovascular Disease

## 2013-04-17 VITALS — BP 150/94 | HR 69 | Ht 62.0 in | Wt 233.0 lb

## 2013-04-17 DIAGNOSIS — I82409 Acute embolism and thrombosis of unspecified deep veins of unspecified lower extremity: Secondary | ICD-10-CM

## 2013-04-17 DIAGNOSIS — Z7901 Long term (current) use of anticoagulants: Secondary | ICD-10-CM

## 2013-04-17 DIAGNOSIS — I82403 Acute embolism and thrombosis of unspecified deep veins of lower extremity, bilateral: Secondary | ICD-10-CM

## 2013-04-17 DIAGNOSIS — I2699 Other pulmonary embolism without acute cor pulmonale: Secondary | ICD-10-CM | POA: Diagnosis not present

## 2013-04-17 DIAGNOSIS — Z86711 Personal history of pulmonary embolism: Secondary | ICD-10-CM

## 2013-04-17 DIAGNOSIS — E049 Nontoxic goiter, unspecified: Secondary | ICD-10-CM

## 2013-04-17 DIAGNOSIS — I10 Essential (primary) hypertension: Secondary | ICD-10-CM

## 2013-04-17 LAB — POCT INR: INR: 2.5

## 2013-04-17 NOTE — Assessment & Plan Note (Signed)
on Coumadin anticoagulation since April of this year. We will repeat venous Doppler studies

## 2013-04-17 NOTE — Assessment & Plan Note (Signed)
On Coumadin anticoagulation with recent chest CT angiogram performed 04/02/13 revealing complete resolution of the previously noted pulmonary emboli. She did have incidentally noted multinodular goiter.

## 2013-04-17 NOTE — Telephone Encounter (Signed)
Pt stated that Dr Allyson Sabal was not prepared today She said he did not even go over her CT san results. Pt said she had to ask for the report,pt says she has the report and does not understand it. She said she spent over eleven hundred dollars on this test.She would like Dr Allyson Sabal to call her please.

## 2013-04-17 NOTE — Progress Notes (Signed)
04/17/2013 Nicole Gibbs   1946/04/11  [id number]  Primary Physician Jeani Hawking, MD Primary Cardiologist: Runell Gess MD [name]   HPI:  The patient returns today for followup. She is a 67 year old severely overweight, married Caucasian female, mother of one child, who I initially saw because of preop clearance and shortness of breath on August 02, 2012. 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 She had normal LV systolic function and mild RV dysfunction. Ultimately, because of vaginal bleeding, she underwent a D&C with endometrial ablation by Dr. Vincente Poli September 04, 2012, without complication. She is no longer bleeding, and her shortness of breath has improved. Her Coumadin is followed here in our office. Since I last saw her 6 months ago she's been relatively asymptomatic other than dyspnea. A followup CT angiogram revealed complete resolution of her pulmonary emboli but she did have an incidentally noted multinodular goiter.     Current Outpatient Prescriptions  Medication Sig Dispense Refill  . ibuprofen (ADVIL,MOTRIN) 200 MG tablet Take 200 mg by mouth 2 (two) times daily as needed for pain.      . metoprolol (LOPRESSOR) 50 MG tablet Take 1 tablet (50 mg total) by mouth 2 (two) times daily.  60 tablet  5  . nystatin (MYCOSTATIN) powder Apply topically as needed (rash under breasts).      . warfarin (COUMADIN) 3 MG tablet Take 1 & 1/2 tablets by mouth daily or as directed  135 tablet  1  . zolpidem (AMBIEN) 10 MG tablet Take 5-10 mg by mouth at bedtime as needed for sleep.       No current facility-administered medications for this visit.    Allergies  Allergen Reactions  . Sulfa Antibiotics Hives    History   Social History  . Marital Status: Married    Spouse Name: N/A    Number of Children: N/A  . Years of Education: N/A   Occupational History  . Not on file.   Social  History Main Topics  . Smoking status: Never Smoker   . Smokeless tobacco: Never Used  . Alcohol Use: No  . Drug Use: No  . Sexual Activity: Not on file   Other Topics Concern  . Not on file   Social History Narrative  . No narrative on file     Review of Systems: General: negative for chills, fever, night sweats or weight changes.  Cardiovascular: negative for chest pain, dyspnea on exertion, edema, orthopnea, palpitations, paroxysmal nocturnal dyspnea or shortness of breath Dermatological: negative for rash Respiratory: negative for cough or wheezing Urologic: negative for hematuria Abdominal: negative for nausea, vomiting, diarrhea, bright red blood per rectum, melena, or hematemesis Neurologic: negative for visual changes, syncope, or dizziness All other systems reviewed and are otherwise negative except as noted above.    Blood pressure 150/94, pulse 69, height 5\' 2"  (1.575 m), weight 233 lb (105.688 kg).  General appearance: alert and no distress Neck: no adenopathy, no carotid bruit, no JVD, supple, symmetrical, trachea midline and thyroid not enlarged, symmetric, no tenderness/mass/nodules Lungs: clear to auscultation bilaterally Heart: regular rate and rhythm, S1, S2 normal, no murmur, click, rub or gallop Extremities: extremities normal, atraumatic, no cyanosis or edema  EKG normal sinus rhythm at 69 with evidence of left ventricular hypertrophy and nonspecific ST and T wave changes  ASSESSMENT AND PLAN:   DVT (deep venous thrombosis), bilateral on Coumadin anticoagulation since April  of this year. We will repeat venous Doppler studies  Pulmonary embolism, Bilateral On Coumadin anticoagulation with recent chest CT angiogram performed 04/02/13 revealing complete resolution of the previously noted pulmonary emboli. She did have incidentally noted multinodular goiter.  HTN (hypertension)  Controlled on current medications      Runell Gess MD  [facility], [facility] 04/17/2013 12:44 PM

## 2013-04-17 NOTE — Assessment & Plan Note (Signed)
Controlled on current medications 

## 2013-04-17 NOTE — Patient Instructions (Addendum)
Dr Allyson Sabal would like to refer you to an endocrinologist (Dr Talmage Nap)  Dr Allyson Sabal has scheduled you for an ultrasound of the veins in your legs.  Your physician wants you to follow-up in: 6 months with an extender and 1 year with Dr Allyson Sabal. You will receive a reminder letter in the mail two months in advance. If you don't receive a letter, please call our office to schedule the follow-up appointment.

## 2013-04-17 NOTE — Telephone Encounter (Signed)
Message forwarded to K. Vogel, RN to discuss w/ Dr. Berry.   

## 2013-04-18 ENCOUNTER — Encounter: Payer: Self-pay | Admitting: Cardiovascular Disease

## 2013-04-18 ENCOUNTER — Ambulatory Visit (HOSPITAL_COMMUNITY)
Admission: RE | Admit: 2013-04-18 | Discharge: 2013-04-18 | Disposition: A | Discharge status: Home / Self Care | Payer: Medicare Other | Source: Ambulatory Visit | Attending: Cardiovascular Disease | Admitting: Cardiovascular Disease

## 2013-04-18 DIAGNOSIS — I824Z9 Acute embolism and thrombosis of unspecified deep veins of unspecified distal lower extremity: Secondary | ICD-10-CM | POA: Diagnosis not present

## 2013-04-18 DIAGNOSIS — I82409 Acute embolism and thrombosis of unspecified deep veins of unspecified lower extremity: Secondary | ICD-10-CM

## 2013-04-18 NOTE — Progress Notes (Signed)
Lower Extremity Venous Duplex Completed. °Brianna L Mazza,RVT °

## 2013-04-18 NOTE — Telephone Encounter (Signed)
I spoke patient and reviewed the CT scan.  Dr Allyson Sabal did not get an opportunity to call the patient.

## 2013-04-19 ENCOUNTER — Encounter (HOSPITAL_COMMUNITY): Payer: Medicare Other

## 2013-04-23 ENCOUNTER — Telehealth: Payer: Self-pay | Admitting: *Deleted

## 2013-04-23 DIAGNOSIS — I82403 Acute embolism and thrombosis of unspecified deep veins of lower extremity, bilateral: Secondary | ICD-10-CM

## 2013-04-23 NOTE — Telephone Encounter (Signed)
Message copied by Marella Bile on Tue Apr 23, 2013 12:09 PM ------      Message from: Runell Gess      Created: Sun Apr 21, 2013  7:07 PM       Improved venous dopplers. Repeat 6 months ------

## 2013-04-23 NOTE — Telephone Encounter (Signed)
Order placed for repeat doppler in 6 months 

## 2013-05-15 ENCOUNTER — Ambulatory Visit (INDEPENDENT_AMBULATORY_CARE_PROVIDER_SITE_OTHER): Payer: Medicare Other | Admitting: Pharmacist Clinician (PhC)/ Clinical Pharmacy Specialist

## 2013-05-15 VITALS — BP 124/82 | HR 88

## 2013-05-15 DIAGNOSIS — I82409 Acute embolism and thrombosis of unspecified deep veins of unspecified lower extremity: Secondary | ICD-10-CM

## 2013-05-15 DIAGNOSIS — Z5181 Encounter for therapeutic drug level monitoring: Secondary | ICD-10-CM | POA: Diagnosis not present

## 2013-05-15 DIAGNOSIS — I82403 Acute embolism and thrombosis of unspecified deep veins of lower extremity, bilateral: Secondary | ICD-10-CM

## 2013-05-15 DIAGNOSIS — Z7901 Long term (current) use of anticoagulants: Secondary | ICD-10-CM | POA: Diagnosis not present

## 2013-05-15 DIAGNOSIS — I2699 Other pulmonary embolism without acute cor pulmonale: Secondary | ICD-10-CM | POA: Diagnosis not present

## 2013-05-15 LAB — POCT INR: INR: 2.8

## 2013-05-28 DIAGNOSIS — E0590 Thyrotoxicosis, unspecified without thyrotoxic crisis or storm: Secondary | ICD-10-CM | POA: Diagnosis not present

## 2013-05-28 DIAGNOSIS — E049 Nontoxic goiter, unspecified: Secondary | ICD-10-CM | POA: Diagnosis not present

## 2013-05-30 ENCOUNTER — Other Ambulatory Visit (HOSPITAL_COMMUNITY): Payer: Self-pay | Admitting: Endocrinology

## 2013-05-30 DIAGNOSIS — E049 Nontoxic goiter, unspecified: Secondary | ICD-10-CM

## 2013-06-12 ENCOUNTER — Ambulatory Visit (INDEPENDENT_AMBULATORY_CARE_PROVIDER_SITE_OTHER): Payer: Medicare Other | Admitting: Pharmacist Clinician (PhC)/ Clinical Pharmacy Specialist

## 2013-06-12 VITALS — BP 168/104 | HR 88

## 2013-06-12 DIAGNOSIS — I82403 Acute embolism and thrombosis of unspecified deep veins of lower extremity, bilateral: Secondary | ICD-10-CM

## 2013-06-12 DIAGNOSIS — Z7901 Long term (current) use of anticoagulants: Secondary | ICD-10-CM | POA: Diagnosis not present

## 2013-06-12 DIAGNOSIS — I82409 Acute embolism and thrombosis of unspecified deep veins of unspecified lower extremity: Secondary | ICD-10-CM

## 2013-06-12 DIAGNOSIS — I2699 Other pulmonary embolism without acute cor pulmonale: Secondary | ICD-10-CM | POA: Diagnosis not present

## 2013-06-12 LAB — POCT INR: INR: 2.1

## 2013-06-24 ENCOUNTER — Encounter (HOSPITAL_COMMUNITY): Payer: Medicare Other

## 2013-06-25 ENCOUNTER — Encounter (HOSPITAL_COMMUNITY): Payer: Medicare Other

## 2013-07-10 ENCOUNTER — Ambulatory Visit: Payer: Medicare Other | Admitting: Pharmacist Clinician (PhC)/ Clinical Pharmacy Specialist

## 2013-07-11 ENCOUNTER — Ambulatory Visit (HOSPITAL_COMMUNITY): Payer: Medicare Other

## 2013-07-12 ENCOUNTER — Encounter (HOSPITAL_COMMUNITY): Payer: Medicare Other

## 2013-07-16 ENCOUNTER — Ambulatory Visit (INDEPENDENT_AMBULATORY_CARE_PROVIDER_SITE_OTHER): Payer: Medicare Other | Admitting: Pharmacist Clinician (PhC)/ Clinical Pharmacy Specialist

## 2013-07-16 VITALS — BP 170/90 | HR 88

## 2013-07-16 DIAGNOSIS — Z7901 Long term (current) use of anticoagulants: Secondary | ICD-10-CM | POA: Diagnosis not present

## 2013-07-16 DIAGNOSIS — I82403 Acute embolism and thrombosis of unspecified deep veins of lower extremity, bilateral: Secondary | ICD-10-CM

## 2013-07-16 DIAGNOSIS — I2699 Other pulmonary embolism without acute cor pulmonale: Secondary | ICD-10-CM | POA: Diagnosis not present

## 2013-07-16 DIAGNOSIS — I82409 Acute embolism and thrombosis of unspecified deep veins of unspecified lower extremity: Secondary | ICD-10-CM | POA: Diagnosis not present

## 2013-07-16 LAB — POCT INR: INR: 3.2

## 2013-07-24 ENCOUNTER — Encounter (HOSPITAL_COMMUNITY)
Admission: RE | Admit: 2013-07-24 | Discharge: 2013-07-24 | Disposition: A | Discharge status: Home / Self Care | Payer: Medicare Other | Source: Ambulatory Visit | Attending: Endocrinology | Admitting: Endocrinology

## 2013-07-24 DIAGNOSIS — E042 Nontoxic multinodular goiter: Secondary | ICD-10-CM | POA: Insufficient documentation

## 2013-07-24 DIAGNOSIS — E049 Nontoxic goiter, unspecified: Secondary | ICD-10-CM

## 2013-07-25 ENCOUNTER — Encounter (HOSPITAL_COMMUNITY)
Admission: RE | Admit: 2013-07-25 | Discharge: 2013-07-25 | Disposition: A | Discharge status: Home / Self Care | Payer: Medicare Other | Source: Ambulatory Visit | Attending: Endocrinology | Admitting: Endocrinology

## 2013-07-25 DIAGNOSIS — E042 Nontoxic multinodular goiter: Secondary | ICD-10-CM | POA: Diagnosis not present

## 2013-07-25 MED ORDER — SODIUM IODIDE I 131 CAPSULE
16.0000 | Freq: Once | INTRAVENOUS | Status: AC | PRN
  Administered 2013-07-25: 16 via ORAL

## 2013-07-25 MED ORDER — SODIUM PERTECHNETATE TC 99M INJECTION
10.1000 | Freq: Once | INTRAVENOUS | Status: AC | PRN
  Administered 2013-07-25: 10.1 via INTRAVENOUS

## 2013-07-31 ENCOUNTER — Other Ambulatory Visit: Payer: Self-pay | Admitting: Endocrinology

## 2013-07-31 DIAGNOSIS — E049 Nontoxic goiter, unspecified: Secondary | ICD-10-CM

## 2013-08-02 ENCOUNTER — Ambulatory Visit
Admission: RE | Admit: 2013-08-02 | Discharge: 2013-08-02 | Disposition: A | Discharge status: Home / Self Care | Payer: Medicare Other | Source: Ambulatory Visit | Attending: Endocrinology | Admitting: Endocrinology

## 2013-08-02 DIAGNOSIS — E049 Nontoxic goiter, unspecified: Secondary | ICD-10-CM

## 2013-08-02 DIAGNOSIS — E042 Nontoxic multinodular goiter: Secondary | ICD-10-CM | POA: Diagnosis not present

## 2013-08-14 ENCOUNTER — Ambulatory Visit (INDEPENDENT_AMBULATORY_CARE_PROVIDER_SITE_OTHER): Payer: Medicare Other | Admitting: Pharmacist Clinician (PhC)/ Clinical Pharmacy Specialist

## 2013-08-14 DIAGNOSIS — I82409 Acute embolism and thrombosis of unspecified deep veins of unspecified lower extremity: Secondary | ICD-10-CM | POA: Diagnosis not present

## 2013-08-14 DIAGNOSIS — I82403 Acute embolism and thrombosis of unspecified deep veins of lower extremity, bilateral: Secondary | ICD-10-CM

## 2013-08-14 DIAGNOSIS — Z7901 Long term (current) use of anticoagulants: Secondary | ICD-10-CM | POA: Diagnosis not present

## 2013-08-14 DIAGNOSIS — I2699 Other pulmonary embolism without acute cor pulmonale: Secondary | ICD-10-CM

## 2013-08-14 LAB — POCT INR: INR: 2.6

## 2013-09-11 ENCOUNTER — Other Ambulatory Visit: Payer: Self-pay | Admitting: Endocrinology

## 2013-09-11 DIAGNOSIS — E041 Nontoxic single thyroid nodule: Secondary | ICD-10-CM

## 2013-09-13 ENCOUNTER — Ambulatory Visit (INDEPENDENT_AMBULATORY_CARE_PROVIDER_SITE_OTHER): Payer: Medicare Other | Admitting: Pharmacist Clinician (PhC)/ Clinical Pharmacy Specialist

## 2013-09-13 DIAGNOSIS — I82409 Acute embolism and thrombosis of unspecified deep veins of unspecified lower extremity: Secondary | ICD-10-CM | POA: Diagnosis not present

## 2013-09-13 DIAGNOSIS — I82403 Acute embolism and thrombosis of unspecified deep veins of lower extremity, bilateral: Secondary | ICD-10-CM

## 2013-09-13 DIAGNOSIS — I2699 Other pulmonary embolism without acute cor pulmonale: Secondary | ICD-10-CM

## 2013-09-13 DIAGNOSIS — Z7901 Long term (current) use of anticoagulants: Secondary | ICD-10-CM

## 2013-09-13 LAB — POCT INR: INR: 4.6

## 2013-09-18 ENCOUNTER — Telehealth: Payer: Self-pay | Admitting: Pharmacist Clinician (PhC)/ Clinical Pharmacy Specialist

## 2013-09-18 NOTE — Telephone Encounter (Signed)
Pt having biopsy of thyroid by Dr. Chalmers Cater. Need date for lovenox bridge

## 2013-09-19 NOTE — Telephone Encounter (Signed)
Spoke with patient, does not have a date set, but will notify us as soon as that happens.

## 2013-09-25 ENCOUNTER — Ambulatory Visit (INDEPENDENT_AMBULATORY_CARE_PROVIDER_SITE_OTHER): Payer: Medicare Other | Admitting: Pharmacist Clinician (PhC)/ Clinical Pharmacy Specialist

## 2013-09-25 DIAGNOSIS — I2699 Other pulmonary embolism without acute cor pulmonale: Secondary | ICD-10-CM | POA: Diagnosis not present

## 2013-09-25 DIAGNOSIS — Z7901 Long term (current) use of anticoagulants: Secondary | ICD-10-CM

## 2013-09-25 DIAGNOSIS — I82403 Acute embolism and thrombosis of unspecified deep veins of lower extremity, bilateral: Secondary | ICD-10-CM

## 2013-09-25 DIAGNOSIS — I82409 Acute embolism and thrombosis of unspecified deep veins of unspecified lower extremity: Secondary | ICD-10-CM | POA: Diagnosis not present

## 2013-09-25 LAB — POCT INR: INR: 3.2

## 2013-09-25 MED ORDER — ENOXAPARIN SODIUM 100 MG/ML ~~LOC~~ SOLN: 100.0000 mg | Freq: Two times a day (BID) | SUBCUTANEOUS | Status: DC

## 2013-09-25 NOTE — Patient Instructions (Addendum)
Enoxaprin Dosing Schedule  Enoxparin dose: 100mg   Date  Warfarin Dose (evenings) Enoxaprin Dose  5-13 6 1  tab (3mg )   5-14 5 1  tab (3mg )   5-15 4 0   5-16 3 0            8pm  5-17 2 0 8am    8pm  5-18 1 0 8am  5-19 Procedure 1 tab (3mg )   5-20 1 2  tabs (6mg ) 8am    8pm  5-21 2 2  tabs (6mg ) 8am    8pm  5-22 3 1.5 tabs (4.5mg ) 8am    8pm  5-23 4 1.5 tabs (4.5mg ) 8am    8pm  5-24 5 1  tab (3mg ) 8am    8pm  5-25 6 1  tab (3mg )    5-26       7                    1.5 tabs (4.5mg )

## 2013-10-01 ENCOUNTER — Ambulatory Visit
Admission: RE | Admit: 2013-10-01 | Discharge: 2013-10-01 | Disposition: A | Discharge status: Home / Self Care | Payer: Medicare Other | Source: Ambulatory Visit | Attending: Endocrinology | Admitting: Endocrinology

## 2013-10-01 ENCOUNTER — Other Ambulatory Visit (HOSPITAL_COMMUNITY)
Admission: RE | Admit: 2013-10-01 | Discharge: 2013-10-01 | Disposition: A | Discharge status: Home / Self Care | Payer: Medicare Other | Source: Ambulatory Visit | Attending: Interventional Radiology | Admitting: Interventional Radiology

## 2013-10-01 DIAGNOSIS — E049 Nontoxic goiter, unspecified: Secondary | ICD-10-CM | POA: Diagnosis not present

## 2013-10-01 DIAGNOSIS — E041 Nontoxic single thyroid nodule: Secondary | ICD-10-CM

## 2013-10-01 DIAGNOSIS — E042 Nontoxic multinodular goiter: Secondary | ICD-10-CM | POA: Diagnosis not present

## 2013-10-09 ENCOUNTER — Ambulatory Visit (INDEPENDENT_AMBULATORY_CARE_PROVIDER_SITE_OTHER): Payer: Medicare Other | Admitting: Pharmacist Clinician (PhC)/ Clinical Pharmacy Specialist

## 2013-10-09 VITALS — BP 146/96

## 2013-10-09 DIAGNOSIS — Z7901 Long term (current) use of anticoagulants: Secondary | ICD-10-CM

## 2013-10-09 DIAGNOSIS — I82409 Acute embolism and thrombosis of unspecified deep veins of unspecified lower extremity: Secondary | ICD-10-CM | POA: Diagnosis not present

## 2013-10-09 DIAGNOSIS — I2699 Other pulmonary embolism without acute cor pulmonale: Secondary | ICD-10-CM | POA: Diagnosis not present

## 2013-10-09 DIAGNOSIS — I82403 Acute embolism and thrombosis of unspecified deep veins of lower extremity, bilateral: Secondary | ICD-10-CM

## 2013-10-09 LAB — POCT INR: INR: 2.6

## 2013-10-14 ENCOUNTER — Telehealth (HOSPITAL_COMMUNITY): Payer: Self-pay | Admitting: *Deleted

## 2013-10-29 ENCOUNTER — Telehealth (HOSPITAL_COMMUNITY): Payer: Self-pay | Admitting: *Deleted

## 2013-10-30 ENCOUNTER — Ambulatory Visit (INDEPENDENT_AMBULATORY_CARE_PROVIDER_SITE_OTHER): Payer: Medicare Other | Admitting: Pharmacist Clinician (PhC)/ Clinical Pharmacy Specialist

## 2013-10-30 VITALS — BP 156/94 | HR 80

## 2013-10-30 DIAGNOSIS — I82403 Acute embolism and thrombosis of unspecified deep veins of lower extremity, bilateral: Secondary | ICD-10-CM

## 2013-10-30 DIAGNOSIS — Z7901 Long term (current) use of anticoagulants: Secondary | ICD-10-CM

## 2013-10-30 DIAGNOSIS — I82409 Acute embolism and thrombosis of unspecified deep veins of unspecified lower extremity: Secondary | ICD-10-CM | POA: Diagnosis not present

## 2013-10-30 DIAGNOSIS — I2699 Other pulmonary embolism without acute cor pulmonale: Secondary | ICD-10-CM | POA: Diagnosis not present

## 2013-10-30 LAB — POCT INR: INR: 3.1

## 2013-10-31 ENCOUNTER — Other Ambulatory Visit: Payer: Self-pay | Admitting: Pharmacist Clinician (PhC)/ Clinical Pharmacy Specialist

## 2013-11-25 DIAGNOSIS — E0590 Thyrotoxicosis, unspecified without thyrotoxic crisis or storm: Secondary | ICD-10-CM | POA: Diagnosis not present

## 2013-11-25 DIAGNOSIS — E049 Nontoxic goiter, unspecified: Secondary | ICD-10-CM | POA: Diagnosis not present

## 2013-11-27 ENCOUNTER — Ambulatory Visit (INDEPENDENT_AMBULATORY_CARE_PROVIDER_SITE_OTHER): Payer: Medicare Other | Admitting: Pharmacist

## 2013-11-27 DIAGNOSIS — Z7901 Long term (current) use of anticoagulants: Secondary | ICD-10-CM | POA: Diagnosis not present

## 2013-11-27 DIAGNOSIS — I82409 Acute embolism and thrombosis of unspecified deep veins of unspecified lower extremity: Secondary | ICD-10-CM

## 2013-11-27 DIAGNOSIS — I2699 Other pulmonary embolism without acute cor pulmonale: Secondary | ICD-10-CM | POA: Diagnosis not present

## 2013-11-27 DIAGNOSIS — I82403 Acute embolism and thrombosis of unspecified deep veins of lower extremity, bilateral: Secondary | ICD-10-CM

## 2013-11-27 LAB — POCT INR: INR: 3

## 2013-12-25 ENCOUNTER — Ambulatory Visit (INDEPENDENT_AMBULATORY_CARE_PROVIDER_SITE_OTHER): Payer: Medicare Other | Admitting: Pharmacist Clinician (PhC)/ Clinical Pharmacy Specialist

## 2013-12-25 VITALS — BP 150/94

## 2013-12-25 DIAGNOSIS — Z7901 Long term (current) use of anticoagulants: Secondary | ICD-10-CM

## 2013-12-25 DIAGNOSIS — I82403 Acute embolism and thrombosis of unspecified deep veins of lower extremity, bilateral: Secondary | ICD-10-CM

## 2013-12-25 DIAGNOSIS — I2699 Other pulmonary embolism without acute cor pulmonale: Secondary | ICD-10-CM | POA: Diagnosis not present

## 2013-12-25 DIAGNOSIS — I82409 Acute embolism and thrombosis of unspecified deep veins of unspecified lower extremity: Secondary | ICD-10-CM | POA: Diagnosis not present

## 2013-12-25 LAB — POCT INR: INR: 2.7

## 2014-01-22 ENCOUNTER — Ambulatory Visit (INDEPENDENT_AMBULATORY_CARE_PROVIDER_SITE_OTHER): Payer: Medicare Other | Admitting: Pharmacist Clinician (PhC)/ Clinical Pharmacy Specialist

## 2014-01-22 DIAGNOSIS — I82403 Acute embolism and thrombosis of unspecified deep veins of lower extremity, bilateral: Secondary | ICD-10-CM

## 2014-01-22 DIAGNOSIS — I2699 Other pulmonary embolism without acute cor pulmonale: Secondary | ICD-10-CM

## 2014-01-22 DIAGNOSIS — I82409 Acute embolism and thrombosis of unspecified deep veins of unspecified lower extremity: Secondary | ICD-10-CM | POA: Diagnosis not present

## 2014-01-22 DIAGNOSIS — Z7901 Long term (current) use of anticoagulants: Secondary | ICD-10-CM | POA: Diagnosis not present

## 2014-01-22 LAB — POCT INR: INR: 3.5

## 2014-01-22 MED ORDER — METOPROLOL TARTRATE 50 MG PO TABS: ORAL_TABLET | ORAL | Status: DC

## 2014-02-05 ENCOUNTER — Ambulatory Visit (INDEPENDENT_AMBULATORY_CARE_PROVIDER_SITE_OTHER): Payer: Medicare Other | Admitting: Pharmacist Clinician (PhC)/ Clinical Pharmacy Specialist

## 2014-02-05 VITALS — BP 166/100 | HR 76

## 2014-02-05 DIAGNOSIS — Z7901 Long term (current) use of anticoagulants: Secondary | ICD-10-CM

## 2014-02-05 DIAGNOSIS — I82409 Acute embolism and thrombosis of unspecified deep veins of unspecified lower extremity: Secondary | ICD-10-CM

## 2014-02-05 DIAGNOSIS — I82403 Acute embolism and thrombosis of unspecified deep veins of lower extremity, bilateral: Secondary | ICD-10-CM

## 2014-02-05 DIAGNOSIS — I2699 Other pulmonary embolism without acute cor pulmonale: Secondary | ICD-10-CM | POA: Diagnosis not present

## 2014-02-05 LAB — POCT INR: INR: 2.8

## 2014-02-27 DIAGNOSIS — E0590 Thyrotoxicosis, unspecified without thyrotoxic crisis or storm: Secondary | ICD-10-CM | POA: Diagnosis not present

## 2014-02-27 DIAGNOSIS — E049 Nontoxic goiter, unspecified: Secondary | ICD-10-CM | POA: Diagnosis not present

## 2014-03-03 ENCOUNTER — Other Ambulatory Visit: Payer: Self-pay | Admitting: Endocrinology

## 2014-03-03 DIAGNOSIS — E0511 Thyrotoxicosis with toxic single thyroid nodule with thyrotoxic crisis or storm: Secondary | ICD-10-CM

## 2014-03-04 DIAGNOSIS — N390 Urinary tract infection, site not specified: Secondary | ICD-10-CM | POA: Diagnosis not present

## 2014-03-04 DIAGNOSIS — R319 Hematuria, unspecified: Secondary | ICD-10-CM | POA: Diagnosis not present

## 2014-03-05 ENCOUNTER — Ambulatory Visit (INDEPENDENT_AMBULATORY_CARE_PROVIDER_SITE_OTHER): Payer: Medicare Other | Admitting: Pharmacist Clinician (PhC)/ Clinical Pharmacy Specialist

## 2014-03-05 DIAGNOSIS — Z7901 Long term (current) use of anticoagulants: Secondary | ICD-10-CM | POA: Diagnosis not present

## 2014-03-05 DIAGNOSIS — I82403 Acute embolism and thrombosis of unspecified deep veins of lower extremity, bilateral: Secondary | ICD-10-CM | POA: Diagnosis not present

## 2014-03-05 DIAGNOSIS — I2699 Other pulmonary embolism without acute cor pulmonale: Secondary | ICD-10-CM

## 2014-03-05 LAB — POCT INR: INR: 3.5

## 2014-03-05 MED ORDER — WARFARIN SODIUM 3 MG PO TABS: ORAL_TABLET | ORAL | Status: DC

## 2014-03-19 ENCOUNTER — Ambulatory Visit (INDEPENDENT_AMBULATORY_CARE_PROVIDER_SITE_OTHER): Payer: Medicare Other | Admitting: Pharmacist Clinician (PhC)/ Clinical Pharmacy Specialist

## 2014-03-19 DIAGNOSIS — I82403 Acute embolism and thrombosis of unspecified deep veins of lower extremity, bilateral: Secondary | ICD-10-CM | POA: Diagnosis not present

## 2014-03-19 DIAGNOSIS — I2699 Other pulmonary embolism without acute cor pulmonale: Secondary | ICD-10-CM

## 2014-03-19 DIAGNOSIS — Z7901 Long term (current) use of anticoagulants: Secondary | ICD-10-CM

## 2014-03-19 LAB — POCT INR: INR: 2

## 2014-04-16 ENCOUNTER — Ambulatory Visit: Payer: Medicare Other | Admitting: Pharmacist Clinician (PhC)/ Clinical Pharmacy Specialist

## 2014-04-18 ENCOUNTER — Ambulatory Visit (INDEPENDENT_AMBULATORY_CARE_PROVIDER_SITE_OTHER): Payer: Medicare Other | Admitting: Pharmacist Clinician (PhC)/ Clinical Pharmacy Specialist

## 2014-04-18 DIAGNOSIS — Z7901 Long term (current) use of anticoagulants: Secondary | ICD-10-CM | POA: Diagnosis not present

## 2014-04-18 DIAGNOSIS — I2699 Other pulmonary embolism without acute cor pulmonale: Secondary | ICD-10-CM

## 2014-04-18 DIAGNOSIS — I82403 Acute embolism and thrombosis of unspecified deep veins of lower extremity, bilateral: Secondary | ICD-10-CM

## 2014-04-18 LAB — POCT INR: INR: 2.5

## 2014-04-27 DIAGNOSIS — Z23 Encounter for immunization: Secondary | ICD-10-CM | POA: Diagnosis not present

## 2014-05-02 ENCOUNTER — Other Ambulatory Visit: Payer: Self-pay | Admitting: Obstetrics and Gynecology

## 2014-05-02 DIAGNOSIS — Z01419 Encounter for gynecological examination (general) (routine) without abnormal findings: Secondary | ICD-10-CM | POA: Diagnosis not present

## 2014-05-02 DIAGNOSIS — Z124 Encounter for screening for malignant neoplasm of cervix: Secondary | ICD-10-CM | POA: Diagnosis not present

## 2014-05-02 DIAGNOSIS — Z1231 Encounter for screening mammogram for malignant neoplasm of breast: Secondary | ICD-10-CM | POA: Diagnosis not present

## 2014-05-05 LAB — CYTOLOGY - PAP

## 2014-05-13 ENCOUNTER — Ambulatory Visit: Payer: Medicare Other

## 2014-05-13 ENCOUNTER — Ambulatory Visit: Payer: Medicare Other | Admitting: Cardiovascular Disease

## 2014-05-19 DIAGNOSIS — D1039 Benign neoplasm of other parts of mouth: Secondary | ICD-10-CM | POA: Diagnosis not present

## 2014-05-21 ENCOUNTER — Ambulatory Visit (INDEPENDENT_AMBULATORY_CARE_PROVIDER_SITE_OTHER): Payer: Medicare Other | Admitting: Pharmacist Clinician (PhC)/ Clinical Pharmacy Specialist

## 2014-05-21 VITALS — BP 150/100 | HR 68

## 2014-05-21 DIAGNOSIS — I2699 Other pulmonary embolism without acute cor pulmonale: Secondary | ICD-10-CM | POA: Diagnosis not present

## 2014-05-21 DIAGNOSIS — I82403 Acute embolism and thrombosis of unspecified deep veins of lower extremity, bilateral: Secondary | ICD-10-CM

## 2014-05-21 DIAGNOSIS — Z7901 Long term (current) use of anticoagulants: Secondary | ICD-10-CM

## 2014-05-21 LAB — POCT INR: INR: 2.5

## 2014-06-20 ENCOUNTER — Ambulatory Visit (INDEPENDENT_AMBULATORY_CARE_PROVIDER_SITE_OTHER): Payer: Medicare Other | Admitting: Cardiovascular Disease

## 2014-06-20 ENCOUNTER — Encounter: Payer: Self-pay | Admitting: Cardiovascular Disease

## 2014-06-20 ENCOUNTER — Ambulatory Visit (INDEPENDENT_AMBULATORY_CARE_PROVIDER_SITE_OTHER): Payer: Medicare Other | Admitting: Pharmacist Clinician (PhC)/ Clinical Pharmacy Specialist

## 2014-06-20 VITALS — BP 172/100 | HR 73 | Ht 62.0 in | Wt 234.0 lb

## 2014-06-20 DIAGNOSIS — I82403 Acute embolism and thrombosis of unspecified deep veins of lower extremity, bilateral: Secondary | ICD-10-CM

## 2014-06-20 DIAGNOSIS — I10 Essential (primary) hypertension: Secondary | ICD-10-CM

## 2014-06-20 DIAGNOSIS — Z7901 Long term (current) use of anticoagulants: Secondary | ICD-10-CM

## 2014-06-20 DIAGNOSIS — I2699 Other pulmonary embolism without acute cor pulmonale: Secondary | ICD-10-CM

## 2014-06-20 LAB — POCT INR: INR: 2.5

## 2014-06-20 MED ORDER — LISINOPRIL 5 MG PO TABS: 5.0000 mg | ORAL_TABLET | Freq: Every day | ORAL | Status: DC

## 2014-06-20 MED ORDER — NITROGLYCERIN 0.4 MG SL SUBL: 0.4000 mg | SUBLINGUAL_TABLET | SUBLINGUAL | Status: DC | PRN

## 2014-06-20 NOTE — Assessment & Plan Note (Signed)
History of hypertension with blood pressure measured sat 172/100. She has a metoprolol. I'm going to add lisinopril 5 mg a day. I've told her to check her blood pressure on a daily basis and keep a log. She'll see [name] back in one month for follow-up. We will check a basic metabolic panel in 3 weeks.

## 2014-06-20 NOTE — Patient Instructions (Signed)
Your physician wants you to follow-up in: 1 Year You will receive a reminder letter in the mail two months in advance. If you don't receive a letter, please call our office to schedule the follow-up appointment.  Your physician has recommended you make the following change in your medication: Start Lisinopril 5 mg  Your physician recommends that you return for lab work in: [place] recommends that you schedule a follow-up appointment in: 1 Month with [name]

## 2014-06-20 NOTE — Assessment & Plan Note (Signed)
History of bilateral pulmonary embolism on Coumadin anticoagulation was continued mild dyspnea on exertion. She has normal LV function with mild RV  enlargement.

## 2014-06-20 NOTE — Progress Notes (Signed)
06/20/2014 Nicole Gibbs   January 31, 1946  [id number]  Primary Physician [name], MD Primary Cardiologist: [name] MD [name]   HPI:  The patient returns today for followup. She is a 69 year old severely overweight, married Caucasian female, mother of one child, who I initially saw because of preop clearance and shortness of breath on August 02, 2012. 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 She had normal LV systolic function and mild RV dysfunction. Ultimately, because of vaginal bleeding, she underwent a D&C with endometrial ablation by Dr. Helane Rima September 05, 9935, without complication. She is no longer bleeding, and her shortness of breath has improved. Her Coumadin is followed here in our office. Since I last saw her 12 months ago she's been relatively asymptomatic other than dyspnea. A followup CT angiogram revealed complete resolution of her pulmonary emboli but she did have an incidentally noted multinodular goiter.her blood pressure is elevated today.   Current Outpatient Prescriptions  Medication Sig Dispense Refill  . ibuprofen (ADVIL,MOTRIN) 200 MG tablet Take 200 mg by mouth 2 (two) times daily as needed for pain.    . metoprolol (LOPRESSOR) 50 MG tablet Take 2 tablets in the morning and 1 tablet in the evening. 270 tablet 1  . nitroGLYCERIN (NITROSTAT) 0.4 MG SL tablet Place 0.4 mg under the tongue every 5 (five) minutes as needed for chest pain.    Marland Kitchen nystatin (MYCOSTATIN) powder Apply topically as needed (rash under breasts).    . warfarin (COUMADIN) 3 MG tablet TAKE 1 TO 1 & 1/2 TABLETS BY MOUTH DAILY OR AS DIRECTED 120 tablet 1   No current facility-administered medications for this visit.    Allergies  Allergen Reactions  . Sulfa Antibiotics Hives    History   Social History  . Marital Status: Married    Spouse Name: N/A    Number of Children: N/A  . Years of  Education: N/A   Occupational History  . Not on file.   Social History Main Topics  . Smoking status: Never Smoker   . Smokeless tobacco: Never Used  . Alcohol Use: No  . Drug Use: No  . Sexual Activity: Not on file   Other Topics Concern  . Not on file   Social History Narrative     Review of Systems: General: negative for chills, fever, night sweats or weight changes.  Cardiovascular: negative for chest pain, dyspnea on exertion, edema, orthopnea, palpitations, paroxysmal nocturnal dyspnea or shortness of breath Dermatological: negative for rash Respiratory: negative for cough or wheezing Urologic: negative for hematuria Abdominal: negative for nausea, vomiting, diarrhea, bright red blood per rectum, melena, or hematemesis Neurologic: negative for visual changes, syncope, or dizziness All other systems reviewed and are otherwise negative except as noted above.    Blood pressure 172/100, pulse 73, height 5\' 2"  (1.575 m), weight 234 lb (106.142 kg).  General appearance: alert and no distress Neck: no adenopathy, no carotid bruit, no JVD, supple, symmetrical, trachea midline and thyroid not enlarged, symmetric, no tenderness/mass/nodules Lungs: clear to auscultation bilaterally Heart: regular rate and rhythm, S1, S2 normal, no murmur, click, rub or gallop Extremities: extremities normal, atraumatic, no cyanosis or edema  EKG normal sinus rhythm at 73 with evidence of lethargy, hypertrophy with repolarization changes. I personally reviewed this EKG  ASSESSMENT AND PLAN:   Pulmonary embolism, Bilateral History of bilateral pulmonary embolism on Coumadin anticoagulation was continued mild dyspnea on  exertion. She has normal LV function with mild RV  enlargement.   HTN (hypertension) History of hypertension with blood pressure measured sat 172/100. She has a metoprolol. I'm going to add lisinopril 5 mg a day. I've told her to check her blood pressure on a daily basis and keep  a log. She'll see [name] back in one month for follow-up. We will check a basic metabolic panel in 3 weeks.       [name] MD FACP,FACC,FAHA, [facility] 06/20/2014 2:45 PM

## 2014-07-10 ENCOUNTER — Other Ambulatory Visit: Payer: Self-pay | Admitting: Cardiovascular Disease

## 2014-07-18 ENCOUNTER — Ambulatory Visit (INDEPENDENT_AMBULATORY_CARE_PROVIDER_SITE_OTHER): Payer: Medicare Other | Admitting: Pharmacist Clinician (PhC)/ Clinical Pharmacy Specialist

## 2014-07-18 VITALS — BP 158/84 | HR 68 | Wt 235.0 lb

## 2014-07-18 DIAGNOSIS — Z7901 Long term (current) use of anticoagulants: Secondary | ICD-10-CM | POA: Diagnosis not present

## 2014-07-18 DIAGNOSIS — I2699 Other pulmonary embolism without acute cor pulmonale: Secondary | ICD-10-CM | POA: Diagnosis not present

## 2014-07-18 DIAGNOSIS — I82403 Acute embolism and thrombosis of unspecified deep veins of lower extremity, bilateral: Secondary | ICD-10-CM

## 2014-07-18 DIAGNOSIS — I10 Essential (primary) hypertension: Secondary | ICD-10-CM | POA: Diagnosis not present

## 2014-07-18 LAB — POCT INR: INR: 2

## 2014-07-18 NOTE — Patient Instructions (Signed)
Return for a a follow up appointment in 6 weeks  Your blood pressure today is 158/84  (goal is <140/90  Check your blood pressure at home daily (if able) and keep record of the readings.  Take your BP meds as follows: increase lisinopril to 10mg  daily  Bring all of your meds, your BP cuff and your record of home blood pressures to your next appointment.  Exercise as you're able, try to walk approximately 30 minutes per day.  Keep salt intake to a minimum, especially watch canned and prepared boxed foods.  Eat more fresh fruits and vegetables and fewer canned items.  Avoid eating in fast food restaurants.    HOW TO TAKE YOUR BLOOD PRESSURE: . Rest 5 minutes before taking your blood pressure. .  Don't smoke or drink caffeinated beverages for at least 30 minutes before. . Take your blood pressure before (not after) you eat. . Sit comfortably with your back supported and both feet on the floor (don't cross your legs). . Elevate your arm to heart level on a table or a desk. . Use the proper sized cuff. It should fit smoothly and snugly around your bare upper arm. There should be enough room to slip a fingertip under the cuff. The bottom edge of the cuff should be 1 inch above the crease of the elbow. . Ideally, take 3 measurements at one sitting and record the average.

## 2014-07-19 LAB — BASIC METABOLIC PANEL
BUN: 15 mg/dL (ref 6–23)
CALCIUM: 8.9 mg/dL (ref 8.4–10.5)
CO2: 30 meq/L (ref 19–32)
Chloride: 105 mEq/L (ref 96–112)
Creat: 0.58 mg/dL (ref 0.50–1.10)
GLUCOSE: 90 mg/dL (ref 70–99)
Potassium: 3.6 mEq/L (ref 3.5–5.3)
Sodium: 142 mEq/L (ref 135–145)

## 2014-07-20 ENCOUNTER — Encounter: Payer: Self-pay | Admitting: Pharmacist Clinician (PhC)/ Clinical Pharmacy Specialist

## 2014-07-20 MED ORDER — LISINOPRIL 10 MG PO TABS: 10.0000 mg | ORAL_TABLET | Freq: Every day | ORAL | Status: DC

## 2014-07-20 NOTE — Progress Notes (Signed)
     07/20/2014 Nicole Gibbs 11-16-1945 [id number]   HPI:  Nicole Gibbs is a 69 y.o. female patient of Dr. [name], with a PMH below who presents today for hypertension clinic evaluation.  When she saw Dr. [name] last month her BP was elevated at 172/100.  He started her on lisinopril 5 mg daily.  She reports no adverse problems with this medication.  BMP drawn today shows SCr unchanged.  She does admit to some current stress, as her mother has been hospitalized this week after a fall and she is now in the process of trying to place her in a SNF.   Cardiac Hx: HTN  Family Hx:  Father had valvular heart disease, died at age 41  Social Hx: does not drink alcohol or caffeine, nor use tobacco products  Diet: not good, admits to eating fast food or take out almost daily   Exercise: none  Home BP readings:  Checked by her husband's caregiver, who is in the home 6-8 hours/day most days;  Range from 143-153/71-117.  Only 7 readings  Current antihypertensive medications: lisinopril 5   Current Outpatient Prescriptions  Medication Sig Dispense Refill  . ibuprofen (ADVIL,MOTRIN) 200 MG tablet Take 200 mg by mouth 2 (two) times daily as needed for pain.    Marland Kitchen lisinopril (PRINIVIL,ZESTRIL) 5 MG tablet Take 1 tablet (5 mg total) by mouth daily. 30 tablet 9  . metoprolol (LOPRESSOR) 50 MG tablet Take 2 tablets in the morning and 1 tablet in the evening. 270 tablet 1  . nitroGLYCERIN (NITROSTAT) 0.4 MG SL tablet Place 1 tablet (0.4 mg total) under the tongue every 5 (five) minutes as needed for chest pain. 25 tablet 3  . nystatin (MYCOSTATIN) powder Apply topically as needed (rash under breasts).    . warfarin (COUMADIN) 3 MG tablet TAKE 1 TO 1 AND 1/2 TABLETS BY MOUTH DAILY OR AS DIRECTED 120 tablet 1   No current facility-administered medications for this visit.    Allergies  Allergen Reactions  . Sulfa Antibiotics Hives    Past Medical History  Diagnosis Date  . Thyroid nodule     no meds  .  Hypertension   . Pulmonary embolism, bilateral   . DVT, bilateral lower limbs   . Shortness of breath   . Arthritis     knees  . Postmenopausal vaginal bleeding   . Rash     under breasts  . Nocturia   . Obese   . Diastolic dysfunction     grade 1  . Aortic regurgitation     mild  . Mitral valve annular calcification     mild  . Hypokinesis     EF 51%    Blood pressure 158/84, pulse 68, weight 235 lb (106.595 kg).    Tommy Medal PharmD CPP [facility]

## 2014-07-20 NOTE — Assessment & Plan Note (Signed)
Today her BP remains elevated at 158/84.  We had a long discussion about her diet and lack of exercise.   She stated Dr. [name] told her if she could just lose 50 pounds she would feel so much better, but she admits she doesn't have the drive to do this.  I suggested she start taking daily walks as the weather is improving.  Explained that weight loss/exercise will decrease her BP and maybe decrease her need for meds as well.  For now I am going to increase her lisinopril to 10 mg daily and I will continue to check her BP at future INR checks, and make recommendations and adjustments accordingly.

## 2014-08-29 ENCOUNTER — Ambulatory Visit (INDEPENDENT_AMBULATORY_CARE_PROVIDER_SITE_OTHER): Payer: Medicare Other | Admitting: Pharmacist Clinician (PhC)/ Clinical Pharmacy Specialist

## 2014-08-29 DIAGNOSIS — I82403 Acute embolism and thrombosis of unspecified deep veins of lower extremity, bilateral: Secondary | ICD-10-CM | POA: Diagnosis not present

## 2014-08-29 DIAGNOSIS — Z7901 Long term (current) use of anticoagulants: Secondary | ICD-10-CM

## 2014-08-29 DIAGNOSIS — I2699 Other pulmonary embolism without acute cor pulmonale: Secondary | ICD-10-CM

## 2014-08-29 LAB — POCT INR: INR: 2.6

## 2014-08-29 MED ORDER — METOPROLOL TARTRATE 50 MG PO TABS: ORAL_TABLET | ORAL | Status: DC

## 2014-08-29 MED ORDER — LISINOPRIL 10 MG PO TABS: 10.0000 mg | ORAL_TABLET | Freq: Every day | ORAL | Status: DC

## 2014-10-10 ENCOUNTER — Ambulatory Visit (INDEPENDENT_AMBULATORY_CARE_PROVIDER_SITE_OTHER): Payer: Medicare Other | Admitting: Pharmacist Clinician (PhC)/ Clinical Pharmacy Specialist

## 2014-10-10 ENCOUNTER — Other Ambulatory Visit: Payer: Self-pay | Admitting: *Deleted

## 2014-10-10 ENCOUNTER — Ambulatory Visit (HOSPITAL_COMMUNITY)
Admission: RE | Admit: 2014-10-10 | Discharge: 2014-10-10 | Disposition: A | Discharge status: Home / Self Care | Payer: Medicare Other | Source: Ambulatory Visit | Attending: Cardiovascular Disease | Admitting: Cardiovascular Disease

## 2014-10-10 DIAGNOSIS — I82403 Acute embolism and thrombosis of unspecified deep veins of lower extremity, bilateral: Secondary | ICD-10-CM

## 2014-10-10 DIAGNOSIS — R0602 Shortness of breath: Secondary | ICD-10-CM

## 2014-10-10 DIAGNOSIS — Z86718 Personal history of other venous thrombosis and embolism: Secondary | ICD-10-CM

## 2014-10-10 DIAGNOSIS — Z7901 Long term (current) use of anticoagulants: Secondary | ICD-10-CM | POA: Diagnosis not present

## 2014-10-10 DIAGNOSIS — I2699 Other pulmonary embolism without acute cor pulmonale: Secondary | ICD-10-CM

## 2014-10-10 LAB — POCT INR: INR: 2.3

## 2014-10-10 NOTE — Progress Notes (Signed)
Patient seeing [name] for INR check . Complains of sudden onset SOB. H/O DVTs. [name] spoke with Dr. Percival Spanish, DOD - he advised BNP today and LE venous to assess for clots.   BNP & test ordered.

## 2014-10-11 LAB — BRAIN NATRIURETIC PEPTIDE: BRAIN NATRIURETIC PEPTIDE: 687.5 pg/mL — AB (ref 0.0–100.0)

## 2014-10-13 NOTE — Progress Notes (Signed)
Please make an appointment for her to see a MLP

## 2014-11-04 ENCOUNTER — Encounter: Payer: Self-pay | Admitting: Cardiovascular Disease

## 2014-11-04 ENCOUNTER — Ambulatory Visit (INDEPENDENT_AMBULATORY_CARE_PROVIDER_SITE_OTHER): Payer: Medicare Other | Admitting: Cardiovascular Disease

## 2014-11-04 VITALS — BP 154/92 | HR 70 | Ht 61.5 in | Wt 236.3 lb

## 2014-11-04 DIAGNOSIS — I10 Essential (primary) hypertension: Secondary | ICD-10-CM | POA: Diagnosis not present

## 2014-11-04 DIAGNOSIS — R0602 Shortness of breath: Secondary | ICD-10-CM | POA: Diagnosis not present

## 2014-11-04 DIAGNOSIS — I2699 Other pulmonary embolism without acute cor pulmonale: Secondary | ICD-10-CM

## 2014-11-04 NOTE — Assessment & Plan Note (Signed)
History of hypertension and blood pressure measured at 154/92. She is on lisinopril  And metoprolol. Continue current meds at current dosing

## 2014-11-04 NOTE — Assessment & Plan Note (Signed)
History of bilateral pulmonary pulmonary emboli and DVT in the past back in 2014. A follow-up CT scan showed complete resolution of her pulmonary emboli and recent lower extremity venous Doppler studies performed in our office 10/10/14 revealed resolution of her DVT. She remains on Coumadin anticoagulation.

## 2014-11-04 NOTE — Assessment & Plan Note (Signed)
Esteem and confused to complain of progressive dyspnea on exertion. She did have a Myoview stress test and 2-D echo performed March 2014 which were normal with grade 1 diastolic dysfunction. I'm going to repeat those. I suspect her shortness of breath is related to deconditioning and obesity.

## 2014-11-04 NOTE — Progress Notes (Signed)
11/04/2014 Nicole Gibbs   Mar 20, 1946  [id number]  Primary Physician [name], MD Primary Cardiologist: [name] MD [name]   HPI:  The patient returns today for followup. She is a 69 year old severely overweight, married Caucasian female, mother of one child, who I initially saw because of preop clearance and shortness of breath on August 02, 2012. 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 She had normal LV systolic function and mild RV dysfunction. Ultimately, because of vaginal bleeding, she underwent a D&C with endometrial ablation by Dr. Helane Rima September 04, 1789, without complication. She is no longer bleeding, and her shortness of breath has improved. Her Coumadin is followed here in our office. A followup CT angiogram revealed complete resolution of her pulmonary emboli but she did have an incidentally noted multinodular goiter. Follow-up lower extremity venous Doppler study showed resolution of her DVT. She does complain of progressive dyspnea which is somewhat limiting.   Current Outpatient Prescriptions  Medication Sig Dispense Refill  . ibuprofen (ADVIL,MOTRIN) 200 MG tablet Take 200 mg by mouth 2 (two) times daily as needed for pain.    Marland Kitchen lisinopril (PRINIVIL,ZESTRIL) 10 MG tablet Take 1 tablet (10 mg total) by mouth daily. 90 tablet 1  . metoprolol (LOPRESSOR) 50 MG tablet Take 2 tablets in the morning and 1 tablet in the evening. 270 tablet 1  . nitroGLYCERIN (NITROSTAT) 0.4 MG SL tablet Place 1 tablet (0.4 mg total) under the tongue every 5 (five) minutes as needed for chest pain. 25 tablet 3  . nystatin (MYCOSTATIN) powder Apply topically as needed (rash under breasts).    . warfarin (COUMADIN) 3 MG tablet TAKE 1 TO 1 AND 1/2 TABLETS BY MOUTH DAILY OR AS DIRECTED 120 tablet 1   No current facility-administered medications for this visit.    Allergies  Allergen Reactions    . Sulfa Antibiotics Hives    History   Social History  . Marital Status: Married    Spouse Name: N/A  . Number of Children: N/A  . Years of Education: N/A   Occupational History  . Not on file.   Social History Main Topics  . Smoking status: Never Smoker   . Smokeless tobacco: Never Used  . Alcohol Use: No  . Drug Use: No  . Sexual Activity: Not on file   Other Topics Concern  . Not on file   Social History Narrative     Review of Systems: General: negative for chills, fever, night sweats or weight changes.  Cardiovascular: negative for chest pain, dyspnea on exertion, edema, orthopnea, palpitations, paroxysmal nocturnal dyspnea or shortness of breath Dermatological: negative for rash Respiratory: negative for cough or wheezing Urologic: negative for hematuria Abdominal: negative for nausea, vomiting, diarrhea, bright red blood per rectum, melena, or hematemesis Neurologic: negative for visual changes, syncope, or dizziness All other systems reviewed and are otherwise negative except as noted above.    Blood pressure 154/92, pulse 70, height 5' 1.5" (1.562 m), weight 236 lb 4.8 oz (107.185 kg).  General appearance: alert and no distress Neck: no adenopathy, no carotid bruit, no JVD, supple, symmetrical, trachea midline and thyroid not enlarged, symmetric, no tenderness/mass/nodules Lungs: clear to auscultation bilaterally Heart: regular rate and rhythm, S1, S2 normal, no murmur, click, rub or gallop Extremities: extremities normal, atraumatic, no cyanosis or edema  EKG not performed today  ASSESSMENT AND PLAN:   Pulmonary embolism, Bilateral History of  bilateral pulmonary pulmonary emboli and DVT in the past back in 2014. A follow-up CT scan showed complete resolution of her pulmonary emboli and recent lower extremity venous Doppler studies performed in our office 10/10/14 revealed resolution of her DVT. She remains on Coumadin anticoagulation.  HTN  (hypertension) History of hypertension and blood pressure measured at 154/92. She is on lisinopril  And metoprolol. Continue current meds at current dosing  Dyspnea on exertion Esteem and confused to complain of progressive dyspnea on exertion. She did have a Myoview stress test and 2-D echo performed March 2014 which were normal with grade 1 diastolic dysfunction. I'm going to repeat those. I suspect her shortness of breath is related to deconditioning and obesity.      [name] MD FACP,FACC,FAHA, [facility] 11/04/2014 2:17 PM

## 2014-11-04 NOTE — Patient Instructions (Signed)
Your physician wants you to follow-up in: 6 Months You will receive a reminder letter in the mail two months in advance. If you don't receive a letter, please call our office to schedule the follow-up appointment.  Your physician has requested that you have an echocardiogram. Echocardiography is a painless test that uses sound waves to create images of your heart. It provides your doctor with information about the size and shape of your heart and how well your heart's chambers and valves are working. This procedure takes approximately one hour. There are no restrictions for this procedure.  Your physician has requested that you have a lexiscan myoview. For further information please visit [email or web address]. Please follow instruction sheet, as given.

## 2014-11-19 ENCOUNTER — Ambulatory Visit: Payer: Medicare Other | Admitting: Cardiovascular Disease

## 2014-11-19 ENCOUNTER — Ambulatory Visit: Payer: Medicare Other | Admitting: Pharmacist Clinician (PhC)/ Clinical Pharmacy Specialist

## 2014-11-26 ENCOUNTER — Telehealth: Payer: Self-pay | Admitting: Pharmacist Clinician (PhC)/ Clinical Pharmacy Specialist

## 2014-11-26 NOTE — Telephone Encounter (Signed)
Closed encounter °

## 2014-12-01 ENCOUNTER — Ambulatory Visit (INDEPENDENT_AMBULATORY_CARE_PROVIDER_SITE_OTHER): Payer: Medicare Other | Admitting: Pharmacist Clinician (PhC)/ Clinical Pharmacy Specialist

## 2014-12-01 DIAGNOSIS — I82403 Acute embolism and thrombosis of unspecified deep veins of lower extremity, bilateral: Secondary | ICD-10-CM | POA: Diagnosis not present

## 2014-12-01 DIAGNOSIS — Z7901 Long term (current) use of anticoagulants: Secondary | ICD-10-CM | POA: Diagnosis not present

## 2014-12-01 DIAGNOSIS — I2699 Other pulmonary embolism without acute cor pulmonale: Secondary | ICD-10-CM | POA: Diagnosis not present

## 2014-12-01 LAB — POCT INR: INR: 2.7

## 2014-12-02 ENCOUNTER — Ambulatory Visit (HOSPITAL_COMMUNITY): Payer: Medicare Other

## 2014-12-02 ENCOUNTER — Other Ambulatory Visit (HOSPITAL_COMMUNITY): Payer: Medicare Other

## 2014-12-03 ENCOUNTER — Ambulatory Visit (HOSPITAL_COMMUNITY): Payer: Medicare Other

## 2015-01-12 ENCOUNTER — Other Ambulatory Visit: Payer: Self-pay | Admitting: Cardiovascular Disease

## 2015-01-12 ENCOUNTER — Ambulatory Visit (INDEPENDENT_AMBULATORY_CARE_PROVIDER_SITE_OTHER): Payer: Medicare Other | Admitting: Pharmacist Clinician (PhC)/ Clinical Pharmacy Specialist

## 2015-01-12 DIAGNOSIS — I82403 Acute embolism and thrombosis of unspecified deep veins of lower extremity, bilateral: Secondary | ICD-10-CM

## 2015-01-12 DIAGNOSIS — I2699 Other pulmonary embolism without acute cor pulmonale: Secondary | ICD-10-CM | POA: Diagnosis not present

## 2015-01-12 DIAGNOSIS — Z7901 Long term (current) use of anticoagulants: Secondary | ICD-10-CM

## 2015-01-12 LAB — POCT INR: INR: 3

## 2015-02-19 DIAGNOSIS — N390 Urinary tract infection, site not specified: Secondary | ICD-10-CM | POA: Diagnosis not present

## 2015-02-19 DIAGNOSIS — I10 Essential (primary) hypertension: Secondary | ICD-10-CM | POA: Diagnosis not present

## 2015-02-19 DIAGNOSIS — J3089 Other allergic rhinitis: Secondary | ICD-10-CM | POA: Diagnosis not present

## 2015-02-23 ENCOUNTER — Ambulatory Visit (INDEPENDENT_AMBULATORY_CARE_PROVIDER_SITE_OTHER): Payer: Medicare Other | Admitting: Pharmacist Clinician (PhC)/ Clinical Pharmacy Specialist

## 2015-02-23 DIAGNOSIS — I2699 Other pulmonary embolism without acute cor pulmonale: Secondary | ICD-10-CM | POA: Diagnosis not present

## 2015-02-23 DIAGNOSIS — I82403 Acute embolism and thrombosis of unspecified deep veins of lower extremity, bilateral: Secondary | ICD-10-CM

## 2015-02-23 DIAGNOSIS — Z7901 Long term (current) use of anticoagulants: Secondary | ICD-10-CM

## 2015-02-23 LAB — POCT INR: INR: 2.5

## 2015-02-25 ENCOUNTER — Ambulatory Visit
Admission: RE | Admit: 2015-02-25 | Discharge: 2015-02-25 | Disposition: A | Discharge status: Home / Self Care | Payer: Medicare Other | Source: Ambulatory Visit | Attending: Endocrinology | Admitting: Endocrinology

## 2015-02-25 DIAGNOSIS — E0511 Thyrotoxicosis with toxic single thyroid nodule with thyrotoxic crisis or storm: Secondary | ICD-10-CM

## 2015-02-25 DIAGNOSIS — E0590 Thyrotoxicosis, unspecified without thyrotoxic crisis or storm: Secondary | ICD-10-CM | POA: Diagnosis not present

## 2015-03-02 DIAGNOSIS — E049 Nontoxic goiter, unspecified: Secondary | ICD-10-CM | POA: Diagnosis not present

## 2015-03-02 DIAGNOSIS — E0590 Thyrotoxicosis, unspecified without thyrotoxic crisis or storm: Secondary | ICD-10-CM | POA: Diagnosis not present

## 2015-03-03 ENCOUNTER — Other Ambulatory Visit: Payer: Self-pay | Admitting: Endocrinology

## 2015-03-03 DIAGNOSIS — E049 Nontoxic goiter, unspecified: Secondary | ICD-10-CM

## 2015-04-06 ENCOUNTER — Ambulatory Visit (INDEPENDENT_AMBULATORY_CARE_PROVIDER_SITE_OTHER): Payer: Medicare Other | Admitting: Pharmacist Clinician (PhC)/ Clinical Pharmacy Specialist

## 2015-04-06 DIAGNOSIS — I2699 Other pulmonary embolism without acute cor pulmonale: Secondary | ICD-10-CM

## 2015-04-06 DIAGNOSIS — Z7901 Long term (current) use of anticoagulants: Secondary | ICD-10-CM | POA: Diagnosis not present

## 2015-04-06 DIAGNOSIS — I82403 Acute embolism and thrombosis of unspecified deep veins of lower extremity, bilateral: Secondary | ICD-10-CM

## 2015-04-06 LAB — POCT INR: INR: 3.1

## 2015-05-04 ENCOUNTER — Ambulatory Visit: Payer: Medicare Other | Admitting: Pharmacist Clinician (PhC)/ Clinical Pharmacy Specialist

## 2015-05-06 ENCOUNTER — Encounter (HOSPITAL_COMMUNITY): Payer: Self-pay | Admitting: *Deleted

## 2015-05-06 ENCOUNTER — Ambulatory Visit (INDEPENDENT_AMBULATORY_CARE_PROVIDER_SITE_OTHER): Payer: Medicare Other | Admitting: Pharmacist Clinician (PhC)/ Clinical Pharmacy Specialist

## 2015-05-06 ENCOUNTER — Ambulatory Visit (INDEPENDENT_AMBULATORY_CARE_PROVIDER_SITE_OTHER): Payer: Medicare Other | Admitting: Family Medicine

## 2015-05-06 ENCOUNTER — Ambulatory Visit (INDEPENDENT_AMBULATORY_CARE_PROVIDER_SITE_OTHER): Payer: Medicare Other

## 2015-05-06 ENCOUNTER — Inpatient Hospital Stay (HOSPITAL_COMMUNITY)
Admission: EM | Admit: 2015-05-06 | Discharge: 2015-05-11 | DRG: 378 | Disposition: A | Discharge status: Home Health | Payer: Medicare Other | Attending: Internal Medicine | Admitting: Internal Medicine

## 2015-05-06 VITALS — BP 148/74 | HR 106 | Temp 97.2°F | Resp 22 | Ht 62.0 in | Wt 220.8 lb

## 2015-05-06 DIAGNOSIS — Z79899 Other long term (current) drug therapy: Secondary | ICD-10-CM

## 2015-05-06 DIAGNOSIS — Z86711 Personal history of pulmonary embolism: Secondary | ICD-10-CM

## 2015-05-06 DIAGNOSIS — Z8489 Family history of other specified conditions: Secondary | ICD-10-CM

## 2015-05-06 DIAGNOSIS — Z131 Encounter for screening for diabetes mellitus: Secondary | ICD-10-CM

## 2015-05-06 DIAGNOSIS — K921 Melena: Secondary | ICD-10-CM

## 2015-05-06 DIAGNOSIS — R0602 Shortness of breath: Secondary | ICD-10-CM

## 2015-05-06 DIAGNOSIS — R9431 Abnormal electrocardiogram [ECG] [EKG]: Secondary | ICD-10-CM

## 2015-05-06 DIAGNOSIS — Z7901 Long term (current) use of anticoagulants: Secondary | ICD-10-CM

## 2015-05-06 DIAGNOSIS — Z6841 Body Mass Index (BMI) 40.0 and over, adult: Secondary | ICD-10-CM | POA: Diagnosis not present

## 2015-05-06 DIAGNOSIS — Z23 Encounter for immunization: Secondary | ICD-10-CM

## 2015-05-06 DIAGNOSIS — I5032 Chronic diastolic (congestive) heart failure: Secondary | ICD-10-CM | POA: Diagnosis present

## 2015-05-06 DIAGNOSIS — I2699 Other pulmonary embolism without acute cor pulmonale: Secondary | ICD-10-CM | POA: Diagnosis not present

## 2015-05-06 DIAGNOSIS — I82403 Acute embolism and thrombosis of unspecified deep veins of lower extremity, bilateral: Secondary | ICD-10-CM

## 2015-05-06 DIAGNOSIS — R0902 Hypoxemia: Secondary | ICD-10-CM | POA: Diagnosis present

## 2015-05-06 DIAGNOSIS — K922 Gastrointestinal hemorrhage, unspecified: Secondary | ICD-10-CM | POA: Diagnosis not present

## 2015-05-06 DIAGNOSIS — Z8249 Family history of ischemic heart disease and other diseases of the circulatory system: Secondary | ICD-10-CM

## 2015-05-06 DIAGNOSIS — R059 Cough, unspecified: Secondary | ICD-10-CM

## 2015-05-06 DIAGNOSIS — D62 Acute posthemorrhagic anemia: Secondary | ICD-10-CM

## 2015-05-06 DIAGNOSIS — Z823 Family history of stroke: Secondary | ICD-10-CM | POA: Diagnosis not present

## 2015-05-06 DIAGNOSIS — Z881 Allergy status to other antibiotic agents status: Secondary | ICD-10-CM | POA: Diagnosis not present

## 2015-05-06 DIAGNOSIS — I10 Essential (primary) hypertension: Secondary | ICD-10-CM | POA: Diagnosis present

## 2015-05-06 DIAGNOSIS — T45515A Adverse effect of anticoagulants, initial encounter: Secondary | ICD-10-CM | POA: Diagnosis present

## 2015-05-06 DIAGNOSIS — Z86718 Personal history of other venous thrombosis and embolism: Secondary | ICD-10-CM

## 2015-05-06 DIAGNOSIS — I825Y3 Chronic embolism and thrombosis of unspecified deep veins of proximal lower extremity, bilateral: Secondary | ICD-10-CM

## 2015-05-06 DIAGNOSIS — R05 Cough: Secondary | ICD-10-CM | POA: Diagnosis not present

## 2015-05-06 DIAGNOSIS — I351 Nonrheumatic aortic (valve) insufficiency: Secondary | ICD-10-CM | POA: Diagnosis present

## 2015-05-06 DIAGNOSIS — M1990 Unspecified osteoarthritis, unspecified site: Secondary | ICD-10-CM | POA: Diagnosis not present

## 2015-05-06 DIAGNOSIS — B379 Candidiasis, unspecified: Secondary | ICD-10-CM

## 2015-05-06 DIAGNOSIS — R791 Abnormal coagulation profile: Secondary | ICD-10-CM | POA: Diagnosis present

## 2015-05-06 DIAGNOSIS — I2782 Chronic pulmonary embolism: Secondary | ICD-10-CM

## 2015-05-06 LAB — POCT CBC
Granulocyte percent: 66.5 %G (ref 37–80)
HCT, POC: 22.5 % — AB (ref 37.7–47.9)
Hemoglobin: 6.2 g/dL — AB (ref 12.2–16.2)
Lymph, poc: 2.1 (ref 0.6–3.4)
MCH, POC: 16.4 pg — AB (ref 27–31.2)
MCHC: 27.6 g/dL — AB (ref 31.8–35.4)
MCV: 59.4 fL — AB (ref 80–97)
MID (CBC): 0.6 (ref 0–0.9)
MPV: 7.5 fL (ref 0–99.8)
POC GRANULOCYTE: 5.3 (ref 2–6.9)
POC LYMPH PERCENT: 26.5 %L (ref 10–50)
POC MID %: 7 % (ref 0–12)
Platelet Count, POC: 543 10*3/uL — AB (ref 142–424)
RBC: 3.79 M/uL — AB (ref 4.04–5.48)
RDW, POC: 19.5 %
WBC: 7.9 10*3/uL (ref 4.6–10.2)

## 2015-05-06 LAB — PROTIME-INR
INR: 2.53 — ABNORMAL HIGH (ref 0.00–1.49)
Prothrombin Time: 27 seconds — ABNORMAL HIGH (ref 11.6–15.2)

## 2015-05-06 LAB — CBC WITH DIFFERENTIAL/PLATELET
BASOS ABS: 0.1 10*3/uL (ref 0.0–0.1)
Basophils Relative: 1 %
EOS ABS: 0.1 10*3/uL (ref 0.0–0.7)
Eosinophils Relative: 1 %
HCT: 24.7 % — ABNORMAL LOW (ref 36.0–46.0)
HEMOGLOBIN: 6.2 g/dL — AB (ref 12.0–15.0)
LYMPHS PCT: 17 %
Lymphs Abs: 1.4 10*3/uL (ref 0.7–4.0)
MCH: 16 pg — ABNORMAL LOW (ref 26.0–34.0)
MCHC: 25.1 g/dL — ABNORMAL LOW (ref 30.0–36.0)
MCV: 63.8 fL — ABNORMAL LOW (ref 78.0–100.0)
MONOS PCT: 11 %
Monocytes Absolute: 0.9 10*3/uL (ref 0.1–1.0)
NEUTROS ABS: 5.5 10*3/uL (ref 1.7–7.7)
NEUTROS PCT: 70 %
PLATELETS: 569 10*3/uL — AB (ref 150–400)
RBC: 3.87 MIL/uL (ref 3.87–5.11)
RDW: 18.7 % — ABNORMAL HIGH (ref 11.5–15.5)
SMEAR REVIEW: INCREASED
WBC: 8 10*3/uL (ref 4.0–10.5)

## 2015-05-06 LAB — BASIC METABOLIC PANEL
ANION GAP: 9 (ref 5–15)
BUN: 14 mg/dL (ref 6–20)
CALCIUM: 8.7 mg/dL — AB (ref 8.9–10.3)
CO2: 25 mmol/L (ref 22–32)
Chloride: 108 mmol/L (ref 101–111)
Creatinine, Ser: 0.72 mg/dL (ref 0.44–1.00)
GFR calc Af Amer: >60 mL/min (ref >60)
GLUCOSE: 120 mg/dL — AB (ref 65–99)
Potassium: 3.6 mmol/L (ref 3.5–5.1)
Sodium: 142 mmol/L (ref 135–145)

## 2015-05-06 LAB — POC OCCULT BLOOD, ED: FECAL OCCULT BLD: NEGATIVE

## 2015-05-06 LAB — IFOBT (OCCULT BLOOD): IMMUNOLOGICAL FECAL OCCULT BLOOD TEST: NEGATIVE

## 2015-05-06 LAB — POCT INR: INR: 2.8

## 2015-05-06 LAB — ABO/RH: ABO/RH(D): O POS

## 2015-05-06 LAB — PREPARE RBC (CROSSMATCH)

## 2015-05-06 MED ORDER — ACETAMINOPHEN 650 MG RE SUPP: 650.0000 mg | Freq: Four times a day (QID) | RECTAL | Status: DC | PRN

## 2015-05-06 MED ORDER — PANTOPRAZOLE SODIUM 40 MG IV SOLR: 40.0000 mg | Freq: Two times a day (BID) | INTRAVENOUS | Status: DC

## 2015-05-06 MED ORDER — SODIUM CHLORIDE 0.9 % IV SOLN
INTRAVENOUS | Status: DC
  Administered 2015-05-06: via INTRAVENOUS

## 2015-05-06 MED ORDER — ACETAMINOPHEN 325 MG PO TABS
650.0000 mg | ORAL_TABLET | Freq: Four times a day (QID) | ORAL | Status: DC | PRN
Start: 2015-05-06 — End: 2015-05-11
  Administered 2015-05-08 – 2015-05-11 (×3): 650 mg via ORAL
  Filled 2015-05-06 (×2): qty 2

## 2015-05-06 MED ORDER — ONDANSETRON HCL 4 MG PO TABS: 4.0000 mg | ORAL_TABLET | Freq: Four times a day (QID) | ORAL | Status: DC | PRN

## 2015-05-06 MED ORDER — SODIUM CHLORIDE 0.9 % IV SOLN
10.0000 mL/h | Freq: Once | INTRAVENOUS | Status: AC
  Administered 2015-05-06: 10 mL/h via INTRAVENOUS

## 2015-05-06 MED ORDER — SODIUM CHLORIDE 0.9 % IV SOLN
80.0000 mg | Freq: Once | INTRAVENOUS | Status: AC
  Administered 2015-05-07: 80 mg via INTRAVENOUS
  Filled 2015-05-06: qty 80

## 2015-05-06 MED ORDER — NYSTATIN 100000 UNIT/GM EX POWD: Freq: Four times a day (QID) | CUTANEOUS | Status: DC

## 2015-05-06 MED ORDER — NITROGLYCERIN 0.4 MG SL SUBL: 0.4000 mg | SUBLINGUAL_TABLET | SUBLINGUAL | Status: DC | PRN

## 2015-05-06 MED ORDER — ONDANSETRON HCL 4 MG/2ML IJ SOLN
4.0000 mg | Freq: Four times a day (QID) | INTRAMUSCULAR | Status: DC | PRN
  Administered 2015-05-07 – 2015-05-09 (×2): 4 mg via INTRAVENOUS
  Filled 2015-05-06 (×2): qty 2

## 2015-05-06 MED ORDER — ONDANSETRON HCL 4 MG/2ML IJ SOLN: 4.0000 mg | Freq: Three times a day (TID) | INTRAMUSCULAR | Status: DC | PRN

## 2015-05-06 MED ORDER — DEXTROSE 5 % IV SOLN
2.5000 mg | Freq: Once | INTRAVENOUS | Status: AC
  Administered 2015-05-06: 2.5 mg via INTRAVENOUS
  Filled 2015-05-06: qty 0.25

## 2015-05-06 MED ORDER — SODIUM CHLORIDE 0.9 % IV SOLN
8.0000 mg/h | INTRAVENOUS | Status: DC
  Administered 2015-05-07 – 2015-05-09 (×5): 8 mg/h via INTRAVENOUS
  Filled 2015-05-06 (×12): qty 80

## 2015-05-06 MED ORDER — LEVALBUTEROL HCL 0.63 MG/3ML IN NEBU
0.6300 mg | INHALATION_SOLUTION | Freq: Once | RESPIRATORY_TRACT | Status: AC
  Administered 2015-05-06: 0.63 mg via RESPIRATORY_TRACT

## 2015-05-06 NOTE — ED Notes (Signed)
Lab called critical lab value hgb 6.2 notified EDP.

## 2015-05-06 NOTE — ED Notes (Signed)
Nicole Gibbs in main lab to add cmp onto blood work

## 2015-05-06 NOTE — ED Notes (Signed)
Patient sign blood consent form and nurse witnessed.

## 2015-05-06 NOTE — ED Provider Notes (Signed)
CSN: [id number]     Arrival date & time 05/06/15  1901 History   First MD Initiated Contact with Patient 05/06/15 1932     Chief Complaint  Patient presents with  . Rectal Bleeding  . Cough      HPI Patient presents with c/o cough for about 3 months and has had bloody stools for the last 4 days. C/o weakness, skin pale .  Patient's had no history of GI bleed in the past.  Patient is currently on Coumadin because of previous history of DVT. Past Medical History  Diagnosis Date  . Thyroid nodule     no meds  . Hypertension   . Pulmonary embolism, bilateral ([place])   . DVT, bilateral lower limbs ([place])   . Shortness of breath   . Arthritis     knees  . Postmenopausal vaginal bleeding   . Rash     under breasts  . Nocturia   . Obese   . Diastolic dysfunction     grade 1  . Aortic regurgitation     mild  . Mitral valve annular calcification     mild  . Hypokinesis     EF 51%   Past Surgical History  Procedure Laterality Date  . Cesarean section  1992  . Hysteroscopy w/d&c  03-24-2009  . Dilation and curettage of uterus  06/20/2012    Procedure: DILATATION AND CURETTAGE;  Surgeon: [name], MD;  Location: [facility];  Service: Gynecology;  Laterality: N/A;  . Dilation and evacution  1988    missed ab  . Cardiovascular stress test  08-07-2012  DR HILTY/ DR BERRY    LOW RISK NUCLEAR STUDY/ NO ISCHEMIA/ FIXED MID TO DISTAL ANTERIOR AND APICAL DEFECT MAY REPRESENT SCAR VS BREAST ATTENUATION ARTIFACT/  MILD DISTAL ANTERIOR HYPOKINESIS/ EF 51%  . Transthoracic echocardiogram  08-08-2012    LVSF NORMAL/ EF [id number]  GRADE I DIASTOLIC DYSFUNCTION/ MILD AV AND MV REGURG./  RVSF MILDLY REDUCED  . Dilitation & currettage/hystroscopy with thermachoice ablation N/A 09/04/2012    Procedure: DILATATION & CURETTAGE WITH THERMACHOICE ABLATION;  Surgeon: [name], MD;  Location: [facility];  Service: Gynecology;  Laterality: N/A;   Family History  Problem Relation  Age of Onset  . Coronary artery disease Father 73  . Stroke Father   . Sudden death Sister   . Hypertension Mother   . Mental illness Mother     anxiety   Social History  Substance Use Topics  . Smoking status: Never Smoker   . Smokeless tobacco: Never Used  . Alcohol Use: No   OB History    No data available     Review of Systems  Constitutional: Positive for fatigue.  Gastrointestinal: Positive for blood in stool (Patient's had melanotic stools).    All other systems reviewed and are negative  Allergies  Sulfa antibiotics  Home Medications   Prior to Admission medications   Medication Sig Start Date End Date Taking? Authorizing Provider  ibuprofen (ADVIL,MOTRIN) 200 MG tablet Take 200 mg by mouth 2 (two) times daily as needed for pain.    Historical Provider, MD  lisinopril (PRINIVIL,ZESTRIL) 10 MG tablet Take 1 tablet (10 mg total) by mouth daily. Patient not taking: Reported on 05/06/2015 08/29/14   [name], MD  metoprolol (LOPRESSOR) 50 MG tablet Take 2 tablets in the morning and 1 tablet in the evening. Patient not taking: Reported on 05/06/2015 08/29/14   [name], MD  nitroGLYCERIN (NITROSTAT) 0.4 MG SL tablet Place 1 tablet (0.4 mg total) under the tongue every 5 (five) minutes as needed for chest pain. Patient not taking: Reported on 05/06/2015 06/20/14   [name], MD  nystatin (MYCOSTATIN) powder Apply topically 4 (four) times daily. 05/06/15   Wardell Honour, MD  warfarin (COUMADIN) 3 MG tablet TAKE 1 TO 1 AND 1/2 TABLETS BY MOUTH DAILY OR AS DIRECTED 01/13/15   [name], MD   BP 145/89 mmHg  Pulse 99  Temp(Src) 98 F (36.7 C) (Oral)  Resp 20  Ht 5\' 2"  (1.575 m)  Wt 220 lb (99.791 kg)  BMI 40.23 kg/m2  SpO2 100% Physical Exam Physical Exam  Nursing note and vitals reviewed. Constitutional: She is oriented to person, place, and time. She appears well-developed and well-nourished. No distress.  HENT:  Head: Normocephalic  and atraumatic.  Eyes: Pupils are equal, round, and reactive to light.  Neck: Normal range of motion.  Cardiovascular: Normal rate and intact distal pulses.   Pulmonary/Chest: No respiratory distress.  Abdominal: Normal appearance. She exhibits no distension.  Musculoskeletal: Normal range of motion.  Neurological: She is alert and oriented to person, place, and time. No cranial nerve deficit.  Skin: Skin is warm and dry. No rash noted.  patient appears pale. Psychiatric: She has a normal mood and affect. Her behavior is normal.   ED Course  Procedures (including critical care time) Labs Review Labs Reviewed  CBC WITH DIFFERENTIAL/PLATELET - Abnormal; Notable for the following:    Hemoglobin 6.2 (*)    HCT 24.7 (*)    MCV 63.8 (*)    MCH 16.0 (*)    MCHC 25.1 (*)    RDW 18.7 (*)    Platelets 569 (*)    All other components within normal limits  BASIC METABOLIC PANEL - Abnormal; Notable for the following:    Glucose, Bld 120 (*)    Calcium 8.7 (*)    All other components within normal limits  PROTIME-INR - Abnormal; Notable for the following:    Prothrombin Time 27.0 (*)    INR 2.53 (*)    All other components within normal limits  COMPREHENSIVE METABOLIC PANEL  POC OCCULT BLOOD, ED  TYPE AND SCREEN  PREPARE RBC (CROSSMATCH)  ABO/RH    Imaging Review Dg Chest 2 View  05/06/2015  CLINICAL DATA:  Vomiting, shortness of breath, cough EXAM: CHEST  2 VIEW COMPARISON:  03/27/2013, 08/03/2012 FINDINGS: Stable cardiomegaly with slight vascular congestion and chronic basilar atelectasis versus scarring. No acute CHF, edema pattern or pneumonia. No collapse or consolidation. No significant effusion or pneumothorax. Degenerative changes of the spine. No acute osseous finding. IMPRESSION: Stable cardiomegaly with slight vascular congestion and basilar atelectasis versus scarring. No interval change or superimposed acute process. Electronically Signed   By: Jerilynn Mages.  Shick M.D.   On: 05/06/2015  17:43   I have personally reviewed and evaluated these images and lab results as part of my medical decision-making.   EKG Interpretation None      MDM   Final diagnoses:  Gastrointestinal hemorrhage, unspecified gastritis, unspecified gastrointestinal hemorrhage type        Leonard Schwartz, MD 05/06/15 2108

## 2015-05-06 NOTE — ED Notes (Signed)
Patient presents with c/o cough for about 3 months and has had bloody stools for the last 4 days. C/o weakness, skin pale

## 2015-05-06 NOTE — ED Notes (Signed)
After 15 minutes of initial blood transfusion vital signs documented and blood remained at 174ml/hr.

## 2015-05-06 NOTE — ED Notes (Signed)
Admit Doctor at bedside.  

## 2015-05-06 NOTE — ED Notes (Signed)
Stool collected by nurse with second nurse Monique at bedside.

## 2015-05-06 NOTE — H&P (Signed)
[facility] History and Physical  Nicole Gibbs KZS:[id number] DOB: 29-Dec-1945 DOA: 05/06/2015  Referring physician: Dr.Beaton. PCP: Jeani Hawking, MD  Specialists: Dr.Berry. Cardiologist.  Chief Complaint: Shortness of breath.  HPI: Nicole Gibbs is a 69 y.o. female with history of PE on Coumadin, hypertension closely on no medications had gone to the urgent care center because of shortness of breath. Lab work showed patient had hemoglobin of 6 which was a drop of 6 g from previous. Stool for blood showed melanotic stool. Patient was referred to the ER. Patient states over the last 4 days patient has noticed black tarry stools. Denies any abdominal pain or chest pain. Denies using any NSAIDs. Patient's INR is therapeutic. At this time patient has been admitted for acute GI bleed most likely upper GI. Patient is hemodynamically stable. Denies any hematemesis. Patient states she has not had any previous episodes of GI bleed and has not had any endoscopies.   Review of Systems: As presented in the history of presenting illness, rest negative.  Past Medical History  Diagnosis Date  . Thyroid nodule     no meds  . Hypertension   . Pulmonary embolism, bilateral (HCC)   . DVT, bilateral lower limbs (HCC)   . Shortness of breath   . Arthritis     knees  . Postmenopausal vaginal bleeding   . Rash     under breasts  . Nocturia   . Obese   . Diastolic dysfunction     grade 1  . Aortic regurgitation     mild  . Mitral valve annular calcification     mild  . Hypokinesis     EF 51%   Past Surgical History  Procedure Laterality Date  . Cesarean section  1992  . Hysteroscopy w/d&c  03-24-2009  . Dilation and curettage of uterus  06/20/2012    Procedure: DILATATION AND CURETTAGE;  Surgeon: Jeani Hawking, MD;  Location: [facility];  Service: Gynecology;  Laterality: N/A;  . Dilation and evacution  1988    missed ab  . Cardiovascular stress test  08-07-2012  DR HILTY/ DR BERRY   LOW RISK NUCLEAR STUDY/ NO ISCHEMIA/ FIXED MID TO DISTAL ANTERIOR AND APICAL DEFECT MAY REPRESENT SCAR VS BREAST ATTENUATION ARTIFACT/  MILD DISTAL ANTERIOR HYPOKINESIS/ EF 51%  . Transthoracic echocardiogram  08-08-2012    LVSF NORMAL/ EF 55-60%/  GRADE I DIASTOLIC DYSFUNCTION/ MILD AV AND MV REGURG./  RVSF MILDLY REDUCED  . Dilitation & currettage/hystroscopy with thermachoice ablation N/A 09/04/2012    Procedure: DILATATION & CURETTAGE WITH THERMACHOICE ABLATION;  Surgeon: Jeani Hawking, MD;  Location: [facility];  Service: Gynecology;  Laterality: N/A;   Social History:  reports that she has never smoked. She has never used smokeless tobacco. She reports that she does not drink alcohol or use illicit drugs. Where does patient live home. Can patient participate in ADLs? Yes.  Allergies  Allergen Reactions  . Sulfa Antibiotics Hives    Family History:  Family History  Problem Relation Age of Onset  . Coronary artery disease Father 76  . Stroke Father   . Sudden death Sister   . Hypertension Mother   . Mental illness Mother     anxiety      Prior to Admission medications   Medication Sig Start Date End Date Taking? Authorizing Provider  ibuprofen (ADVIL,MOTRIN) 200 MG tablet Take 200 mg by mouth 2 (two) times daily as needed for pain.   Yes Historical Provider,  MD  metoprolol (LOPRESSOR) 50 MG tablet Take 2 tablets in the morning and 1 tablet in the evening. Patient taking differently: Take 50-100 mg by mouth 2 (two) times daily. Take 2 tablets in the morning and 1 tablet in the evening. 08/29/14  Yes Runell Gess, MD  nitroGLYCERIN (NITROSTAT) 0.4 MG SL tablet Place 1 tablet (0.4 mg total) under the tongue every 5 (five) minutes as needed for chest pain. 06/20/14  Yes Runell Gess, MD  nystatin (MYCOSTATIN) powder Apply topically 4 (four) times daily. 05/06/15  Yes Ethelda Chick, MD  warfarin (COUMADIN) 3 MG tablet TAKE 1 TO 1 AND 1/2 TABLETS BY MOUTH DAILY  OR AS DIRECTED Patient taking differently: Take 3 mg by mouth on Mon, Tue, Wed, Fri, and Sat. Take 4.5 mg by mouth daily on Sun and Thur. 01/13/15  Yes Runell Gess, MD  lisinopril (PRINIVIL,ZESTRIL) 10 MG tablet Take 1 tablet (10 mg total) by mouth daily. Patient not taking: Reported on 05/06/2015 08/29/14   Runell Gess, MD    Physical Exam: Filed Vitals:   05/06/15 2130 05/06/15 2132 05/06/15 2145 05/06/15 2215  BP: 147/82 146/79 149/75 149/74  Pulse: 97 94 98 89  Temp:  99.2 F (37.3 C)  98.8 F (37.1 C)  TempSrc:  Oral  Oral  Resp: 24 22 24 22   Height:      Weight:      SpO2: 100% 100% 100% 100%     General:  Moderately built and nourished.  Eyes: Anicteric no pallor.  ENT: No discharge from the ears eyes nose and mouth.  Neck: No mass felt.  Cardiovascular: S1 and S2 heard.  Respiratory: No rhonchi or crepitations.  Abdomen: Soft nontender bowel sounds present.  Skin: No rash.  Musculoskeletal: No edema.  Psychiatric: Appears normal.  Neurologic: Alert awake oriented to time place and person. Moves all extremities.  Labs on Admission:  Basic Metabolic Panel:  Recent Labs Lab 05/06/15 1932  NA 142  K 3.6  CL 108  CO2 25  GLUCOSE 120*  BUN 14  CREATININE 0.72  CALCIUM 8.7*   Liver Function Tests: No results for input(s): AST, ALT, ALKPHOS, BILITOT, PROT, ALBUMIN in the last 168 hours. No results for input(s): LIPASE, AMYLASE in the last 168 hours. No results for input(s): AMMONIA in the last 168 hours. CBC:  Recent Labs Lab 05/06/15 1713 05/06/15 1932  WBC 7.9 8.0  NEUTROABS  --  5.5  HGB 6.2* 6.2*  HCT 22.5* 24.7*  MCV 59.4* 63.8*  PLT  --  569*   Cardiac Enzymes: No results for input(s): CKTOTAL, CKMB, CKMBINDEX, TROPONINI in the last 168 hours.  BNP (last 3 results) No results for input(s): BNP in the last 8760 hours.  ProBNP (last 3 results) No results for input(s): PROBNP in the last 8760 hours.  CBG: No results for  input(s): GLUCAP in the last 168 hours.  Radiological Exams on Admission: Dg Chest 2 View  05/06/2015  CLINICAL DATA:  Vomiting, shortness of breath, cough EXAM: CHEST  2 VIEW COMPARISON:  03/27/2013, 08/03/2012 FINDINGS: Stable cardiomegaly with slight vascular congestion and chronic basilar atelectasis versus scarring. No acute CHF, edema pattern or pneumonia. No collapse or consolidation. No significant effusion or pneumothorax. Degenerative changes of the spine. No acute osseous finding. IMPRESSION: Stable cardiomegaly with slight vascular congestion and basilar atelectasis versus scarring. No interval change or superimposed acute process. Electronically Signed   By: Judie Petit.  Shick M.D.   On: 05/06/2015  17:43     Assessment/Plan Principal Problem:   Acute GI bleeding Active Problems:   Acute blood loss anemia   History of pulmonary embolism   1. Acute GI bleeding most likely upper GI bleed given the melanotic stools in the setting of coagulopathy from Coumadin - at this time patient is receiving 2 units of packed red blood cell transfusion. I have placed patient on Protonix infusion. Patient will be kept nothing by mouth in anticipation of possible EGD. Consult gastroenterologist in a.m. With regarding to coagulopathy seen #2. 2. History of PE on Coumadin - at this time I have discussed with patient about discontinuing Coumadin and placing patient on vitamin K. I have also explained the risk of doing so. Patient is agreeable. Patient may need IVC filter. Patient has had only one episode of PE and DVT 3 years ago. 3. Acute blood loss anemia with symptomatic anemia - follow CBC closely. 4. Hypertension presently on no medications.  I have reviewed patient's old charts and labs. Personally reviewed patient's chest x-ray.   DVT Prophylaxis SCDs.  Code Status: Full code.  Family Communication: Discussed with patient.  Disposition Plan: Admit to inpatient.    Nicole Gibbs N. [facility] Pager [phone].  If 7PM-7AM, please contact night-coverage [email or web address] Password [facility] 05/06/2015, 10:42 PM

## 2015-05-06 NOTE — Patient Instructions (Signed)
1.  GO IMMEDIATELY TO [place] EMERGENCY DEPARTMENT DUE TO YOUR SEVERE ANEMIA AND SHORTNESS OF BREATH.

## 2015-05-06 NOTE — Progress Notes (Signed)
Subjective:    Patient ID: Glade Nurse, female    DOB: May 08, 1946, 69 y.o.   MRN: [id number]  05/06/2015  Dry heaves; Shortness of Breath; Medication Refill; Cough; and Immunizations   HPI This 69 y.o. female presents for evaluation of vomiting, SOB.  Three months ago, started dry heaving at hospital while husband was undergoing surgery.  Went to Urgent Care right after surgery two months ago, diagnosed with allergies.   Has never suffered with dry heaves.  Has suffered with cough for two months.  In past week, dry heaves started over again.  Just came from Coumadin clinic who recommended evaluation by urgent care.  Not sure if cardiac versus gastroenterology.   Having a difficult time breathing.  Very worried about heart. Has laid around the past week. Today was the first day out; every 20 feet, would need to stop to rest due to DOE.  No fever/chills/sweats.  No headache. +itching diffusely.  No ST; no ear pain.  No rhinorrhea except for today.  No nasal congestion; no PND.  Onset of cough three months ago; dry heaves start after coughing.  Has vomited twice.  Gagging.   No nausea.  Will see piece of dirt on window which will trigger nausea and will cause dry heaving.  The refrigerator is leaking which causes nausea.  Eating fine.  Eating does not cause vomiting.  No abdominal pain. No sputum.  Some wheezing at times; especially at night.  Daugther has noticed a wheeze at night.  No swelling in legs.  No chest pain.  Having trouble sleeping due to symptoms; takes a while to go to sleep; went to bed very early; took two naps yesterday before bedtime.  Tries to sleep; laying down makes breathing worse and coughing is worse at night.  No diarrhea.  No abdominal pain.  Has taken cough medicine nighttime/Nyquil; takes PRN; Nyquil seems to help some.  Husband sleeps downstairs; he has progressive MS.   No tobacco; never smoked.  No history of asthma. History of pneumonia ten years ago.  History of pulmonary  embolism three years ago.  INR 2.8.   Denies GERD or indigestion or heartburn.   Diagnosed with pulmonary embolism three years ago.   Advised to remain on Coumadin life long.  Also has a thyroid problem.   Had just had two D&Cs prior to pulmonary embolism.  No HRT prior to [facility].  Dr. Vincente Poli wanted to perform a third D&C and then chose to undergo ablation. Bled for months November to September prior to blood clots?  Does not recall anything about blood clots.     Mother 19 years old; traumatic brain injury last year.    Stools have been black-white and small in size for the past week.  Onset ten days ago.  Colonoscopy never.  No rectal examination each year with gynecology; no stool kits for completion.  S/p uterine ablation.     PCP: none; Grewal is gynecologist   Review of Systems  Constitutional: Negative for fever, chills, diaphoresis and fatigue.  HENT: Negative for congestion, ear pain, postnasal drip, rhinorrhea and sinus pressure.   Eyes: Negative for visual disturbance.  Respiratory: Positive for cough, shortness of breath and wheezing. Negative for choking and stridor.   Cardiovascular: Negative for chest pain, palpitations and leg swelling.  Gastrointestinal: Positive for nausea and vomiting. Negative for abdominal pain, diarrhea, constipation, blood in stool, abdominal distention, anal bleeding and rectal pain.       Melanotic stools.  Endocrine: Negative for cold intolerance, heat intolerance, polydipsia, polyphagia and polyuria.  Skin: Positive for rash.  Neurological: Negative for dizziness, tremors, seizures, syncope, facial asymmetry, speech difficulty, weakness, light-headedness, numbness and headaches.    Past Medical History  Diagnosis Date  . Thyroid nodule     no meds  . Hypertension   . Pulmonary embolism, bilateral (HCC)   . DVT, bilateral lower limbs (HCC)   . Shortness of breath   . Arthritis     knees  . Postmenopausal vaginal bleeding   . Rash      under breasts  . Nocturia   . Obese   . Diastolic dysfunction     grade 1  . Aortic regurgitation     mild  . Mitral valve annular calcification     mild  . Hypokinesis     EF 51%   Past Surgical History  Procedure Laterality Date  . Cesarean section  1992  . Hysteroscopy w/d&c  03-24-2009  . Dilation and curettage of uterus  06/20/2012    Procedure: DILATATION AND CURETTAGE;  Surgeon: Jeani Hawking, MD;  Location: [facility];  Service: Gynecology;  Laterality: N/A;  . Dilation and evacution  1988    missed ab  . Cardiovascular stress test  08-07-2012  DR HILTY/ DR BERRY    LOW RISK NUCLEAR STUDY/ NO ISCHEMIA/ FIXED MID TO DISTAL ANTERIOR AND APICAL DEFECT MAY REPRESENT SCAR VS BREAST ATTENUATION ARTIFACT/  MILD DISTAL ANTERIOR HYPOKINESIS/ EF 51%  . Transthoracic echocardiogram  08-08-2012    LVSF NORMAL/ EF 55-60%/  GRADE I DIASTOLIC DYSFUNCTION/ MILD AV AND MV REGURG./  RVSF MILDLY REDUCED  . Dilitation & currettage/hystroscopy with thermachoice ablation N/A 09/04/2012    Procedure: DILATATION & CURETTAGE WITH THERMACHOICE ABLATION;  Surgeon: Jeani Hawking, MD;  Location: [facility];  Service: Gynecology;  Laterality: N/A;   Allergies  Allergen Reactions  . Sulfa Antibiotics Hives   Current Outpatient Prescriptions  Medication Sig Dispense Refill  . ibuprofen (ADVIL,MOTRIN) 200 MG tablet Take 200 mg by mouth 2 (two) times daily as needed for pain.    Marland Kitchen warfarin (COUMADIN) 3 MG tablet TAKE 1 TO 1 AND 1/2 TABLETS BY MOUTH DAILY OR AS DIRECTED 120 tablet 1  . lisinopril (PRINIVIL,ZESTRIL) 10 MG tablet Take 1 tablet (10 mg total) by mouth daily. (Patient not taking: Reported on 05/06/2015) 90 tablet 1  . metoprolol (LOPRESSOR) 50 MG tablet Take 2 tablets in the morning and 1 tablet in the evening. (Patient not taking: Reported on 05/06/2015) 270 tablet 1  . nitroGLYCERIN (NITROSTAT) 0.4 MG SL tablet Place 1 tablet (0.4 mg total) under the tongue every 5 (five)  minutes as needed for chest pain. (Patient not taking: Reported on 05/06/2015) 25 tablet 3  . nystatin (MYCOSTATIN) powder Apply topically 4 (four) times daily. 60 g 3   No current facility-administered medications for this visit.   Social History   Social History  . Marital Status: Married    Spouse Name: N/A  . Number of Children: N/A  . Years of Education: N/A   Occupational History  . Not on file.   Social History Main Topics  . Smoking status: Never Smoker   . Smokeless tobacco: Never Used  . Alcohol Use: No  . Drug Use: No  . Sexual Activity: Not on file   Other Topics Concern  . Not on file   Social History Narrative   Family History  Problem Relation Age of Onset  .  Coronary artery disease Father 41  . Stroke Father   . Sudden death Sister   . Hypertension Mother   . Mental illness Mother     anxiety       Objective:    BP 148/74 mmHg  Pulse 106  Temp(Src) 97.2 F (36.2 C) (Oral)  Resp 22  Ht 5\' 2"  (1.575 m)  Wt 220 lb 12.8 oz (100.154 kg)  BMI 40.37 kg/m2  SpO2 98% Physical Exam  Constitutional: She is oriented to person, place, and time. She appears well-developed and well-nourished. No distress.  obese  HENT:  Head: Normocephalic and atraumatic.  Right Ear: External ear normal.  Left Ear: External ear normal.  Nose: Nose normal.  Mouth/Throat: No oropharyngeal exudate.  Mouth dry.  Eyes: Conjunctivae and EOM are normal. Pupils are equal, round, and reactive to light.  Neck: Normal range of motion. Neck supple. Carotid bruit is not present. No thyromegaly present.  Cardiovascular: Normal rate, regular rhythm, normal heart sounds and intact distal pulses.  Exam reveals no gallop and no friction rub.   No murmur heard. 1+ pitting edema to knees B.  Pulmonary/Chest: Effort normal and breath sounds normal. She has no wheezes. She has no rales.  Decreased air movement on L anterior chest.  Moderate air movement; tachypnea with RR 26.  No  wheezing; no rales; no rhonchi.  Abdominal: Soft. Bowel sounds are normal. She exhibits no distension and no mass. There is no tenderness. There is no rebound and no guarding.  Musculoskeletal: She exhibits edema.  Lymphadenopathy:    She has no cervical adenopathy.  Neurological: She is alert and oriented to person, place, and time. No cranial nerve deficit.  Skin: Skin is warm and dry. Rash noted. She is not diaphoretic. No erythema. No pallor.  Erythematous rash under B breasts.  Psychiatric: She has a normal mood and affect. Her behavior is normal.   Results for orders placed or performed in visit on 05/06/15  POCT CBC  Result Value Ref Range   WBC 7.9 4.6 - 10.2 K/uL   Lymph, poc 2.1 0.6 - 3.4   POC LYMPH PERCENT 26.5 10 - 50 %L   MID (cbc) 0.6 0 - 0.9   POC MID % 7.0 0 - 12 %M   POC Granulocyte 5.3 2 - 6.9   Granulocyte percent 66.5 37 - 80 %G   RBC 3.79 (A) 4.04 - 5.48 M/uL   Hemoglobin 6.2 (A) 12.2 - 16.2 g/dL   HCT, POC 82.9 (A) 56.2 - 47.9 %   MCV 59.4 (A) 80 - 97 fL   MCH, POC 16.4 (A) 27 - 31.2 pg   MCHC 27.6 (A) 31.8 - 35.4 g/dL   RDW, POC 13.0 %   Platelet Count, POC 543 (A) 142 - 424 K/uL   MPV 7.5 0 - 99.8 fL  IFOBT POC (occult bld, rslt in office)  Result Value Ref Range   IFOBT Negative    [facility] reading (PRIMARY) by  Dr. Katrinka Blazing. CXR: NAD  EKG: NSR; ST changes unchanged from previous EKG. ?WPW pattern.     XOPENEX NEBULIZER 0.63 ADMINISTERED IN OFFICE.  Assessment & Plan:   1. Shortness of breath   2. Cough   3. Family history of sudden death (sister)   4. Nonspecific abnormal  (EKG), negative nucluear stress test 08/07/12   5. Family history of coronary artery bypass surgery   6. 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 Long term (current) use of anticoagulants   8. 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 Essential hypertension   10. Screening for diabetes mellitus   11.  Candidiasis   12. Acute blood loss anemia   13. Melena    1. Shortness of Breath: New; worsening in past week; due to anemia. 2. Severe anemia:  New.  With melanotic stools in past two weeks.  Symptomatic with moderate SOB. With anticoagulation and symptoms, to ED for admission and blood transfusion. 3.  Cough:  New onset three months ago; CXR negative for pneumonia.  Hacking cough during exam suggestive of bronchospasm; recommend repeat CXR after blood transfusion. 4.  Pulmonary embolism:   Chronic; diagnosed three years ago; need to review chart in detail to determine indication for lifelong anticoagulation.INR today 2.4.   5.  Nausea with question of gagging versus post-tussive emesis;  New; onset three months ago; etiology unclear at this time.  Will warrant EGD to evaluate for PUD or gastritis as etiology to GI bleed/anemia.   7. Melanotic stools: likely suffered GI bleed within past two weeks; hemosure negative in office today.  Warrants EGD and colonoscopy. 8. S/p flu vaccine.  Warrants Prevnar 13 in upcoming three months.    Orders Placed This Encounter  Procedures  . DG Chest 2 View    Standing Status: Future     Number of Occurrences: 1     Standing Expiration Date: 05/05/2016    Order Specific Question:  Reason for Exam (SYMPTOM  OR DIAGNOSIS REQUIRED)    Answer:  cough, SOB, orthopnea.    Order Specific Question:  Preferred imaging location?    Answer:  External  . Comprehensive metabolic panel  . POCT CBC  . IFOBT POC (occult bld, rslt in office)  . EKG 12-Lead   Meds ordered this encounter  Medications  . nystatin (MYCOSTATIN) powder    Sig: Apply topically 4 (four) times daily.    Dispense:  60 g    Refill:  3  . levalbuterol (XOPENEX) nebulizer solution 0.63 mg    Sig:     No Follow-up on file.    Alexes Lamarque Paulita Fujita, M.D. [facility] [address] [place], [place]  [address] [phone] phone [phone] fax

## 2015-05-07 ENCOUNTER — Encounter (HOSPITAL_COMMUNITY)
Admission: EM | Disposition: A | Discharge status: Home Health | Payer: Self-pay | Source: Home / Self Care | Attending: Internal Medicine

## 2015-05-07 ENCOUNTER — Encounter (HOSPITAL_COMMUNITY): Payer: Self-pay | Admitting: *Deleted

## 2015-05-07 HISTORY — PX: ESOPHAGOGASTRODUODENOSCOPY: SHX5428

## 2015-05-07 LAB — COMPREHENSIVE METABOLIC PANEL
ALBUMIN: 3.7 g/dL (ref 3.6–5.1)
ALT: 9 U/L (ref 6–29)
AST: 15 U/L (ref 10–35)
Alkaline Phosphatase: 62 U/L (ref 33–130)
BUN: 15 mg/dL (ref 7–25)
CHLORIDE: 102 mmol/L (ref 98–110)
CO2: 25 mmol/L (ref 20–31)
Calcium: 8.7 mg/dL (ref 8.6–10.4)
Creat: 0.55 mg/dL (ref 0.50–0.99)
Glucose, Bld: 107 mg/dL — ABNORMAL HIGH (ref 65–99)
Potassium: 4.2 mmol/L (ref 3.5–5.3)
Sodium: 137 mmol/L (ref 135–146)
TOTAL PROTEIN: 6.3 g/dL (ref 6.1–8.1)
Total Bilirubin: 1.7 mg/dL — ABNORMAL HIGH (ref 0.2–1.2)

## 2015-05-07 LAB — BASIC METABOLIC PANEL
ANION GAP: 8 (ref 5–15)
BUN: 15 mg/dL (ref 6–20)
CHLORIDE: 108 mmol/L (ref 101–111)
CO2: 23 mmol/L (ref 22–32)
Calcium: 8.3 mg/dL — ABNORMAL LOW (ref 8.9–10.3)
Creatinine, Ser: 0.71 mg/dL (ref 0.44–1.00)
GFR calc Af Amer: >60 mL/min (ref >60)
GFR calc non Af Amer: >60 mL/min (ref >60)
GLUCOSE: 117 mg/dL — AB (ref 65–99)
POTASSIUM: 3.9 mmol/L (ref 3.5–5.1)
Sodium: 139 mmol/L (ref 135–145)

## 2015-05-07 LAB — GLUCOSE, CAPILLARY
GLUCOSE-CAPILLARY: 119 mg/dL — AB (ref 65–99)
GLUCOSE-CAPILLARY: 163 mg/dL — AB (ref 65–99)
Glucose-Capillary: 123 mg/dL — ABNORMAL HIGH (ref 65–99)
Glucose-Capillary: 114 mg/dL — ABNORMAL HIGH (ref 65–99)
Glucose-Capillary: 97 mg/dL (ref 65–99)

## 2015-05-07 LAB — PREPARE RBC (CROSSMATCH)

## 2015-05-07 LAB — CBC
HCT: 28.5 % — ABNORMAL LOW (ref 36.0–46.0)
HEMATOCRIT: 27.7 % — AB (ref 36.0–46.0)
HEMOGLOBIN: 7.5 g/dL — AB (ref 12.0–15.0)
HEMOGLOBIN: 7.6 g/dL — AB (ref 12.0–15.0)
MCH: 18.7 pg — AB (ref 26.0–34.0)
MCH: 18.5 pg — AB (ref 26.0–34.0)
MCHC: 27.1 g/dL — AB (ref 30.0–36.0)
MCHC: 26.7 g/dL — ABNORMAL LOW (ref 30.0–36.0)
MCV: 69.1 fL — ABNORMAL LOW (ref 78.0–100.0)
MCV: 69.5 fL — AB (ref 78.0–100.0)
Platelets: 485 10*3/uL — ABNORMAL HIGH (ref 150–400)
Platelets: 490 10*3/uL — ABNORMAL HIGH (ref 150–400)
RBC: 4.01 MIL/uL (ref 3.87–5.11)
RBC: 4.1 MIL/uL (ref 3.87–5.11)
RDW: 23.1 % — ABNORMAL HIGH (ref 11.5–15.5)
RDW: 22.9 % — ABNORMAL HIGH (ref 11.5–15.5)
WBC: 8.5 10*3/uL (ref 4.0–10.5)
WBC: 9 10*3/uL (ref 4.0–10.5)

## 2015-05-07 LAB — MRSA PCR SCREENING: MRSA by PCR: NEGATIVE

## 2015-05-07 SURGERY — EGD (ESOPHAGOGASTRODUODENOSCOPY)
Anesthesia: Moderate Sedation

## 2015-05-07 MED ORDER — FENTANYL CITRATE (PF) 100 MCG/2ML IJ SOLN
INTRAMUSCULAR | Status: AC
  Filled 2015-05-07: qty 2

## 2015-05-07 MED ORDER — PEG 3350-KCL-NA BICARB-NACL 420 G PO SOLR
4000.0000 mL | Freq: Once | ORAL | Status: AC
  Administered 2015-05-07: 4000 mL via ORAL
  Filled 2015-05-07: qty 4000

## 2015-05-07 MED ORDER — SODIUM CHLORIDE 0.9 % IV SOLN
Freq: Once | INTRAVENOUS | Status: AC
  Administered 2015-05-07: 09:00:00 via INTRAVENOUS

## 2015-05-07 MED ORDER — CHLORHEXIDINE GLUCONATE 0.12 % MT SOLN
15.0000 mL | Freq: Two times a day (BID) | OROMUCOSAL | Status: DC
  Administered 2015-05-07 – 2015-05-11 (×8): 15 mL via OROMUCOSAL
  Filled 2015-05-07 (×6): qty 15

## 2015-05-07 MED ORDER — BUTAMBEN-TETRACAINE-BENZOCAINE 2-2-14 % EX AERO
INHALATION_SPRAY | CUTANEOUS | Status: DC | PRN
  Administered 2015-05-07: 2 via TOPICAL

## 2015-05-07 MED ORDER — CETYLPYRIDINIUM CHLORIDE 0.05 % MT LIQD
7.0000 mL | Freq: Two times a day (BID) | OROMUCOSAL | Status: DC
  Administered 2015-05-07 – 2015-05-10 (×3): 7 mL via OROMUCOSAL

## 2015-05-07 MED ORDER — FUROSEMIDE 10 MG/ML IJ SOLN
20.0000 mg | Freq: Once | INTRAMUSCULAR | Status: AC
  Administered 2015-05-07: 20 mg via INTRAVENOUS
  Filled 2015-05-07: qty 2

## 2015-05-07 MED ORDER — LORAZEPAM 2 MG/ML IJ SOLN
0.5000 mg | Freq: Once | INTRAMUSCULAR | Status: AC
  Administered 2015-05-07: 0.5 mg via INTRAVENOUS
  Filled 2015-05-07: qty 1

## 2015-05-07 MED ORDER — SODIUM CHLORIDE 0.9 % IV SOLN: INTRAVENOUS | Status: DC

## 2015-05-07 MED ORDER — FENTANYL CITRATE (PF) 100 MCG/2ML IJ SOLN
INTRAMUSCULAR | Status: DC | PRN
  Administered 2015-05-07: 25 ug via INTRAVENOUS

## 2015-05-07 MED ORDER — DIPHENHYDRAMINE HCL 50 MG/ML IJ SOLN
INTRAMUSCULAR | Status: AC
  Filled 2015-05-07: qty 1

## 2015-05-07 MED ORDER — SODIUM CHLORIDE 0.9 % IV SOLN
INTRAVENOUS | Status: DC
  Administered 2015-05-07: 23:00:00 via INTRAVENOUS

## 2015-05-07 MED ORDER — MIDAZOLAM HCL 5 MG/ML IJ SOLN
INTRAMUSCULAR | Status: AC
  Filled 2015-05-07: qty 2

## 2015-05-07 MED ORDER — PHYTONADIONE 5 MG PO TABS
10.0000 mg | ORAL_TABLET | Freq: Once | ORAL | Status: AC
  Administered 2015-05-07: 10 mg via ORAL
  Filled 2015-05-07: qty 2

## 2015-05-07 MED ORDER — MIDAZOLAM HCL 10 MG/2ML IJ SOLN
INTRAMUSCULAR | Status: DC | PRN
  Administered 2015-05-07: 1 mg via INTRAVENOUS
  Administered 2015-05-07: 2 mg via INTRAVENOUS

## 2015-05-07 NOTE — H&P (View-Only) (Signed)
[facility] Reason for consult: G.I. bleeding Referring Physician: [facility] hospitalist. PCP:Dr Grewal. Primary G.I.: unassigned  Nicole Gibbs is an 69 y.o. female.  HPI: She is chronically anticoagulated with Coumadin and this had several days of dark stool. She also notes a history for approximately 3 months of gagging and feeling nauseous without vomiting. No heartburn or indigestion and she is not taking any medication for acid reduction. She's never had ulcer disease and never had colonoscopy. She does not take any incidents. She has had a history of pulmonary embolus and DVT about 2 years ago and has been on Coumadin ever since. Her other problems include hypertension congestive heart failure for which she sees Dr. [name]. Her hemoglobin was 6.2 on admission. INR 2.5  Past Medical History  Diagnosis Date  . Thyroid nodule     no meds  . Hypertension   . Pulmonary embolism, bilateral ([place])   . DVT, bilateral lower limbs ([address])   . Shortness of breath   . Arthritis     knees  . Postmenopausal vaginal bleeding   . Rash     under breasts  . Nocturia   . Obese   . Diastolic dysfunction     grade 1  . Aortic regurgitation     mild  . Mitral valve annular calcification     mild  . Hypokinesis     EF 51%    Past Surgical History  Procedure Laterality Date  . Cesarean section  1992  . Hysteroscopy w/d&c  03-24-2009  . Dilation and curettage of uterus  06/20/2012    Procedure: DILATATION AND CURETTAGE;  Surgeon: [name], MD;  Location: [facility];  Service: Gynecology;  Laterality: N/A;  . Dilation and evacution  1988    missed ab  . Cardiovascular stress test  08-07-2012  DR HILTY/ DR BERRY    LOW RISK NUCLEAR STUDY/ NO ISCHEMIA/ FIXED MID TO DISTAL ANTERIOR AND APICAL DEFECT MAY REPRESENT SCAR VS BREAST ATTENUATION ARTIFACT/  MILD DISTAL ANTERIOR HYPOKINESIS/ EF 51%  . Transthoracic echocardiogram  08-08-2012    LVSF NORMAL/ EF 54-09%/  GRADE I DIASTOLIC  DYSFUNCTION/ MILD AV AND MV REGURG./  RVSF MILDLY REDUCED  . Dilitation & currettage/hystroscopy with thermachoice ablation N/A 09/04/2012    Procedure: DILATATION & CURETTAGE WITH THERMACHOICE ABLATION;  Surgeon: [name], MD;  Location: [facility];  Service: Gynecology;  Laterality: N/A;    Family History  Problem Relation Age of Onset  . Coronary artery disease Father 78  . Stroke Father   . Sudden death Sister   . Hypertension Mother   . Mental illness Mother     anxiety    Social History:  reports that she has never smoked. She has never used smokeless tobacco. She reports that she does not drink alcohol or use illicit drugs.  Allergies:  Allergies  Allergen Reactions  . Sulfa Antibiotics Hives    Medications; Prior to Admission medications   Medication Sig Start Date End Date Taking? Authorizing Provider  ibuprofen (ADVIL,MOTRIN) 200 MG tablet Take 200 mg by mouth 2 (two) times daily as needed for pain.   Yes Historical Provider, MD  metoprolol (LOPRESSOR) 50 MG tablet Take 2 tablets in the morning and 1 tablet in the evening. Patient taking differently: Take 50-100 mg by mouth 2 (two) times daily. Take 2 tablets in the morning and 1 tablet in the evening. 08/29/14  Yes [name], MD  nitroGLYCERIN (NITROSTAT) 0.4 MG  SL tablet Place 1 tablet (0.4 mg total) under the tongue every 5 (five) minutes as needed for chest pain. 06/20/14  Yes Jonathan J Berry, MD  nystatin (MYCOSTATIN) powder Apply topically 4 (four) times daily. 05/06/15  Yes Kristi M Smith, MD  warfarin (COUMADIN) 3 MG tablet TAKE 1 TO 1 AND 1/2 TABLETS BY MOUTH DAILY OR AS DIRECTED Patient taking differently: Take 3 mg by mouth on Mon, Tue, Wed, Fri, and Sat. Take 4.5 mg by mouth daily on Sun and Thur. 01/13/15  Yes Jonathan J Berry, MD  lisinopril (PRINIVIL,ZESTRIL) 10 MG tablet Take 1 tablet (10 mg total) by mouth daily. Patient not taking: Reported on 05/06/2015 08/29/14   Jonathan J  Berry, MD   . antiseptic oral rinse  7 mL Mouth Rinse q12n4p  . chlorhexidine  15 mL Mouth Rinse BID  . furosemide  20 mg Intravenous Once  . phytonadione  10 mg Oral Once   PRN Meds acetaminophen **OR** acetaminophen, nitroGLYCERIN, ondansetron **OR** ondansetron (ZOFRAN) IV Results for orders placed or performed during the hospital encounter of 05/06/15 (from the past 48 hour(s))  Type and screen [facility]     Status: None (Preliminary result)   Collection Time: 05/06/15  7:24 PM  Result Value Ref Range   ABO/RH(D) O POS    Antibody Screen NEG    Sample Expiration 05/09/2015    Unit Number [id number]    Blood Component Type RBC LR PHER1    Unit division 00    Status of Unit ISSUED,FINAL    Transfusion Status OK TO TRANSFUSE    Crossmatch Result Compatible    Unit Number [id number]    Blood Component Type RED CELLS,LR    Unit division 00    Status of Unit ISSUED,FINAL    Transfusion Status OK TO TRANSFUSE    Crossmatch Result Compatible    Unit Number [id number]    Blood Component Type RED CELLS,LR    Unit division 00    Status of Unit ISSUED    Transfusion Status OK TO TRANSFUSE    Crossmatch Result Compatible    Unit Number [id number]    Blood Component Type RED CELLS,LR    Unit division 00    Status of Unit ALLOCATED    Transfusion Status OK TO TRANSFUSE    Crossmatch Result Compatible   ABO/Rh     Status: None   Collection Time: 05/06/15  7:24 PM  Result Value Ref Range   ABO/RH(D) O POS   CBC with Differential     Status: Abnormal   Collection Time: 05/06/15  7:32 PM  Result Value Ref Range   WBC 8.0 4.0 - 10.5 K/uL   RBC 3.87 3.87 - 5.11 MIL/uL   Hemoglobin 6.2 (LL) 12.0 - 15.0 g/dL    Comment: REPEATED TO VERIFY SPECIMEN CHECKED FOR CLOTS CRITICAL RESULT CALLED TO, READ BACK BY AND VERIFIED WITH: G NIKOLICK,RN 1949 05/06/2015 WBOND    HCT 24.7 (L) 36.0 - 46.0 %   MCV 63.8 (L) 78.0 - 100.0 fL   MCH 16.0 (L) 26.0 - 34.0 pg    MCHC 25.1 (L) 30.0 - 36.0 g/dL   RDW 18.7 (H) 11.5 - 15.5 %   Platelets 569 (H) 150 - 400 K/uL   Neutrophils Relative % 70 %   Lymphocytes Relative 17 %   Monocytes Relative 11 %   Eosinophils Relative 1 %   Basophils Relative 1 %   Neutro Abs 5.5 1.7 - 7.7 K/uL     Lymphs Abs 1.4 0.7 - 4.0 K/uL   Monocytes Absolute 0.9 0.1 - 1.0 K/uL   Eosinophils Absolute 0.1 0.0 - 0.7 K/uL   Basophils Absolute 0.1 0.0 - 0.1 K/uL   RBC Morphology POLYCHROMASIA PRESENT     Comment: ELLIPTOCYTES RARE NRBCs    Smear Review PLATELETS APPEAR INCREASED   Basic metabolic panel     Status: Abnormal   Collection Time: 05/06/15  7:32 PM  Result Value Ref Range   Sodium 142 135 - 145 mmol/L   Potassium 3.6 3.5 - 5.1 mmol/L   Chloride 108 101 - 111 mmol/L   CO2 25 22 - 32 mmol/L   Glucose, Bld 120 (H) 65 - 99 mg/dL   BUN 14 6 - 20 mg/dL   Creatinine, Ser 0.72 0.44 - 1.00 mg/dL   Calcium 8.7 (L) 8.9 - 10.3 mg/dL   GFR calc non Af Amer >60 >60 mL/min   GFR calc Af Amer >60 >60 mL/min    Comment: (NOTE) The eGFR has been calculated using the CKD EPI equation. This calculation has not been validated in all clinical situations. eGFR's persistently <60 mL/min signify possible Chronic Kidney Disease.    Anion gap 9 5 - 15  Protime-INR     Status: Abnormal   Collection Time: 05/06/15  7:32 PM  Result Value Ref Range   Prothrombin Time 27.0 (H) 11.6 - 15.2 seconds   INR 2.53 (H) 0.00 - 1.49  Prepare RBC     Status: None   Collection Time: 05/06/15  8:30 PM  Result Value Ref Range   Order Confirmation ORDER PROCESSED BY BLOOD BANK   POC occult blood, ED RN will collect     Status: None   Collection Time: 05/06/15  9:58 PM  Result Value Ref Range   Fecal Occult Bld NEGATIVE NEGATIVE  MRSA PCR Screening     Status: None   Collection Time: 05/06/15 11:00 PM  Result Value Ref Range   MRSA by PCR NEGATIVE NEGATIVE    Comment:        The GeneXpert MRSA Assay (FDA approved for NASAL specimens only),  is one component of a comprehensive MRSA colonization surveillance program. It is not intended to diagnose MRSA infection nor to guide or monitor treatment for MRSA infections.   Glucose, capillary     Status: Abnormal   Collection Time: 05/06/15 11:53 PM  Result Value Ref Range   Glucose-Capillary 119 (H) 65 - 99 mg/dL  Basic metabolic panel     Status: Abnormal   Collection Time: 05/07/15  3:02 AM  Result Value Ref Range   Sodium 139 135 - 145 mmol/L   Potassium 3.9 3.5 - 5.1 mmol/L   Chloride 108 101 - 111 mmol/L   CO2 23 22 - 32 mmol/L   Glucose, Bld 117 (H) 65 - 99 mg/dL   BUN 15 6 - 20 mg/dL   Creatinine, Ser 0.71 0.44 - 1.00 mg/dL   Calcium 8.3 (L) 8.9 - 10.3 mg/dL   GFR calc non Af Amer >60 >60 mL/min   GFR calc Af Amer >60 >60 mL/min    Comment: (NOTE) The eGFR has been calculated using the CKD EPI equation. This calculation has not been validated in all clinical situations. eGFR's persistently <60 mL/min signify possible Chronic Kidney Disease.    Anion gap 8 5 - 15  CBC     Status: Abnormal   Collection Time: 05/07/15  3:02 AM  Result Value Ref Range     WBC 8.5 4.0 - 10.5 K/uL   RBC 4.01 3.87 - 5.11 MIL/uL   Hemoglobin 7.5 (L) 12.0 - 15.0 g/dL   HCT 27.7 (L) 36.0 - 46.0 %   MCV 69.1 (L) 78.0 - 100.0 fL    Comment: REPEATED TO VERIFY   MCH 18.7 (L) 26.0 - 34.0 pg   MCHC 27.1 (L) 30.0 - 36.0 g/dL   RDW 23.1 (H) 11.5 - 15.5 %   Platelets 485 (H) 150 - 400 K/uL  Glucose, capillary     Status: Abnormal   Collection Time: 05/07/15  4:44 AM  Result Value Ref Range   Glucose-Capillary 123 (H) 65 - 99 mg/dL  CBC     Status: Abnormal   Collection Time: 05/07/15  7:44 AM  Result Value Ref Range   WBC 9.0 4.0 - 10.5 K/uL   RBC 4.10 3.87 - 5.11 MIL/uL   Hemoglobin 7.6 (L) 12.0 - 15.0 g/dL   HCT 28.5 (L) 36.0 - 46.0 %   MCV 69.5 (L) 78.0 - 100.0 fL   MCH 18.5 (L) 26.0 - 34.0 pg   MCHC 26.7 (L) 30.0 - 36.0 g/dL   RDW 22.9 (H) 11.5 - 15.5 %   Platelets 490 (H)  150 - 400 K/uL  Prepare RBC     Status: None   Collection Time: 05/07/15  8:00 AM  Result Value Ref Range   Order Confirmation ORDER PROCESSED BY BLOOD BANK   Glucose, capillary     Status: Abnormal   Collection Time: 05/07/15  8:09 AM  Result Value Ref Range   Glucose-Capillary 114 (H) 65 - 99 mg/dL   Comment 1 Notify RN    Comment 2 Document in Chart     Dg Chest 2 View  05/06/2015  CLINICAL DATA:  Vomiting, shortness of breath, cough EXAM: CHEST  2 VIEW COMPARISON:  03/27/2013, 08/03/2012 FINDINGS: Stable cardiomegaly with slight vascular congestion and chronic basilar atelectasis versus scarring. No acute CHF, edema pattern or pneumonia. No collapse or consolidation. No significant effusion or pneumothorax. Degenerative changes of the spine. No acute osseous finding. IMPRESSION: Stable cardiomegaly with slight vascular congestion and basilar atelectasis versus scarring. No interval change or superimposed acute process. Electronically Signed   By: M.  Shick M.D.   On: 05/06/2015 17:43              Blood pressure 143/83, pulse 99, temperature 98 F (36.7 C), temperature source Oral, resp. rate 18, height 5' 2" (1.575 m), weight 99.9 kg (220 lb 3.8 oz), SpO2 96 %.  Physical exam:   General-- obese white female in no acute distress full range of motion of all extremities ENT-- nonicteric mucous membrane somewhat dry Neck-- without lymphadenopathy Heart-- regular rate and rhythm without murmurs or gallops Lungs-- clear Abdomen-- soft and nontender Psych-- alert and oriented times 3 and answers questions appropriately Assessment: 1. G.I. bleeding in anticoagulated patients. Probably upper G.I. 2. Chronic anticoagulation  Plan: 1. Patient has had 2 doses of vitamin K to reverse her Coumadin. We will plan on proceeding later today with the GD to evaluate for upper G.I. source of bleeding. Have discussed this with her and she is agreeable.   Tylisha Danis JR,Shawan Tosh L 05/07/2015,  10:11 AM   Pager: [phone] If no answer or after hours call [phone]     

## 2015-05-07 NOTE — Consult Note (Signed)
[facility] Reason for consult: G.I. bleeding Referring Physician: [facility] hospitalist. PCP:Dr Grewal. Primary G.I.: unassigned  Nicole Gibbs is an 69 y.o. female.  HPI: She is chronically anticoagulated with Coumadin and this had several days of dark stool. She also notes a history for approximately 3 months of gagging and feeling nauseous without vomiting. No heartburn or indigestion and she is not taking any medication for acid reduction. She's never had ulcer disease and never had colonoscopy. She does not take any incidents. She has had a history of pulmonary embolus and DVT about 2 years ago and has been on Coumadin ever since. Her other problems include hypertension congestive heart failure for which she sees Dr. [name]. Her hemoglobin was 6.2 on admission. INR 2.5  Past Medical History  Diagnosis Date  . Thyroid nodule     no meds  . Hypertension   . Pulmonary embolism, bilateral ([place])   . DVT, bilateral lower limbs ([place])   . Shortness of breath   . Arthritis     knees  . Postmenopausal vaginal bleeding   . Rash     under breasts  . Nocturia   . Obese   . Diastolic dysfunction     grade 1  . Aortic regurgitation     mild  . Mitral valve annular calcification     mild  . Hypokinesis     EF 51%    Past Surgical History  Procedure Laterality Date  . Cesarean section  1992  . Hysteroscopy w/d&c  03-24-2009  . Dilation and curettage of uterus  06/20/2012    Procedure: DILATATION AND CURETTAGE;  Surgeon: [name], MD;  Location: [facility];  Service: Gynecology;  Laterality: N/A;  . Dilation and evacution  1988    missed ab  . Cardiovascular stress test  08-07-2012  DR HILTY/ DR BERRY    LOW RISK NUCLEAR STUDY/ NO ISCHEMIA/ FIXED MID TO DISTAL ANTERIOR AND APICAL DEFECT MAY REPRESENT SCAR VS BREAST ATTENUATION ARTIFACT/  MILD DISTAL ANTERIOR HYPOKINESIS/ EF 51%  . Transthoracic echocardiogram  08-08-2012    LVSF NORMAL/ EF 54-09%/  GRADE I DIASTOLIC  DYSFUNCTION/ MILD AV AND MV REGURG./  RVSF MILDLY REDUCED  . Dilitation & currettage/hystroscopy with thermachoice ablation N/A 09/04/2012    Procedure: DILATATION & CURETTAGE WITH THERMACHOICE ABLATION;  Surgeon: [name], MD;  Location: [facility];  Service: Gynecology;  Laterality: N/A;    Family History  Problem Relation Age of Onset  . Coronary artery disease Father 67  . Stroke Father   . Sudden death Sister   . Hypertension Mother   . Mental illness Mother     anxiety    Social History:  reports that she has never smoked. She has never used smokeless tobacco. She reports that she does not drink alcohol or use illicit drugs.  Allergies:  Allergies  Allergen Reactions  . Sulfa Antibiotics Hives    Medications; Prior to Admission medications   Medication Sig Start Date End Date Taking? Authorizing Provider  ibuprofen (ADVIL,MOTRIN) 200 MG tablet Take 200 mg by mouth 2 (two) times daily as needed for pain.   Yes Historical Provider, MD  metoprolol (LOPRESSOR) 50 MG tablet Take 2 tablets in the morning and 1 tablet in the evening. Patient taking differently: Take 50-100 mg by mouth 2 (two) times daily. Take 2 tablets in the morning and 1 tablet in the evening. 08/29/14  Yes [name], MD  nitroGLYCERIN (NITROSTAT) 0.4 MG  SL tablet Place 1 tablet (0.4 mg total) under the tongue every 5 (five) minutes as needed for chest pain. 06/20/14  Yes Jonathan J Berry, MD  nystatin (MYCOSTATIN) powder Apply topically 4 (four) times daily. 05/06/15  Yes Kristi M Smith, MD  warfarin (COUMADIN) 3 MG tablet TAKE 1 TO 1 AND 1/2 TABLETS BY MOUTH DAILY OR AS DIRECTED Patient taking differently: Take 3 mg by mouth on Mon, Tue, Wed, Fri, and Sat. Take 4.5 mg by mouth daily on Sun and Thur. 01/13/15  Yes Jonathan J Berry, MD  lisinopril (PRINIVIL,ZESTRIL) 10 MG tablet Take 1 tablet (10 mg total) by mouth daily. Patient not taking: Reported on 05/06/2015 08/29/14   Jonathan J  Berry, MD   . antiseptic oral rinse  7 mL Mouth Rinse q12n4p  . chlorhexidine  15 mL Mouth Rinse BID  . furosemide  20 mg Intravenous Once  . phytonadione  10 mg Oral Once   PRN Meds acetaminophen **OR** acetaminophen, nitroGLYCERIN, ondansetron **OR** ondansetron (ZOFRAN) IV Results for orders placed or performed during the hospital encounter of 05/06/15 (from the past 48 hour(s))  Type and screen [facility]     Status: None (Preliminary result)   Collection Time: 05/06/15  7:24 PM  Result Value Ref Range   ABO/RH(D) O POS    Antibody Screen NEG    Sample Expiration 05/09/2015    Unit Number [id number]    Blood Component Type RBC LR PHER1    Unit division 00    Status of Unit ISSUED,FINAL    Transfusion Status OK TO TRANSFUSE    Crossmatch Result Compatible    Unit Number [id number]    Blood Component Type RED CELLS,LR    Unit division 00    Status of Unit ISSUED,FINAL    Transfusion Status OK TO TRANSFUSE    Crossmatch Result Compatible    Unit Number [id number]    Blood Component Type RED CELLS,LR    Unit division 00    Status of Unit ISSUED    Transfusion Status OK TO TRANSFUSE    Crossmatch Result Compatible    Unit Number [id number]    Blood Component Type RED CELLS,LR    Unit division 00    Status of Unit ALLOCATED    Transfusion Status OK TO TRANSFUSE    Crossmatch Result Compatible   ABO/Rh     Status: None   Collection Time: 05/06/15  7:24 PM  Result Value Ref Range   ABO/RH(D) O POS   CBC with Differential     Status: Abnormal   Collection Time: 05/06/15  7:32 PM  Result Value Ref Range   WBC 8.0 4.0 - 10.5 K/uL   RBC 3.87 3.87 - 5.11 MIL/uL   Hemoglobin 6.2 (LL) 12.0 - 15.0 g/dL    Comment: REPEATED TO VERIFY SPECIMEN CHECKED FOR CLOTS CRITICAL RESULT CALLED TO, READ BACK BY AND VERIFIED WITH: G NIKOLICK,RN 1949 05/06/2015 WBOND    HCT 24.7 (L) 36.0 - 46.0 %   MCV 63.8 (L) 78.0 - 100.0 fL   MCH 16.0 (L) 26.0 - 34.0 pg    MCHC 25.1 (L) 30.0 - 36.0 g/dL   RDW 18.7 (H) 11.5 - 15.5 %   Platelets 569 (H) 150 - 400 K/uL   Neutrophils Relative % 70 %   Lymphocytes Relative 17 %   Monocytes Relative 11 %   Eosinophils Relative 1 %   Basophils Relative 1 %   Neutro Abs 5.5 1.7 - 7.7 K/uL     Lymphs Abs 1.4 0.7 - 4.0 K/uL   Monocytes Absolute 0.9 0.1 - 1.0 K/uL   Eosinophils Absolute 0.1 0.0 - 0.7 K/uL   Basophils Absolute 0.1 0.0 - 0.1 K/uL   RBC Morphology POLYCHROMASIA PRESENT     Comment: ELLIPTOCYTES RARE NRBCs    Smear Review PLATELETS APPEAR INCREASED   Basic metabolic panel     Status: Abnormal   Collection Time: 05/06/15  7:32 PM  Result Value Ref Range   Sodium 142 135 - 145 mmol/L   Potassium 3.6 3.5 - 5.1 mmol/L   Chloride 108 101 - 111 mmol/L   CO2 25 22 - 32 mmol/L   Glucose, Bld 120 (H) 65 - 99 mg/dL   BUN 14 6 - 20 mg/dL   Creatinine, Ser 0.72 0.44 - 1.00 mg/dL   Calcium 8.7 (L) 8.9 - 10.3 mg/dL   GFR calc non Af Amer >60 >60 mL/min   GFR calc Af Amer >60 >60 mL/min    Comment: (NOTE) The eGFR has been calculated using the CKD EPI equation. This calculation has not been validated in all clinical situations. eGFR's persistently <60 mL/min signify possible Chronic Kidney Disease.    Anion gap 9 5 - 15  Protime-INR     Status: Abnormal   Collection Time: 05/06/15  7:32 PM  Result Value Ref Range   Prothrombin Time 27.0 (H) 11.6 - 15.2 seconds   INR 2.53 (H) 0.00 - 1.49  Prepare RBC     Status: None   Collection Time: 05/06/15  8:30 PM  Result Value Ref Range   Order Confirmation ORDER PROCESSED BY BLOOD BANK   POC occult blood, ED RN will collect     Status: None   Collection Time: 05/06/15  9:58 PM  Result Value Ref Range   Fecal Occult Bld NEGATIVE NEGATIVE  MRSA PCR Screening     Status: None   Collection Time: 05/06/15 11:00 PM  Result Value Ref Range   MRSA by PCR NEGATIVE NEGATIVE    Comment:        The GeneXpert MRSA Assay (FDA approved for NASAL specimens only),  is one component of a comprehensive MRSA colonization surveillance program. It is not intended to diagnose MRSA infection nor to guide or monitor treatment for MRSA infections.   Glucose, capillary     Status: Abnormal   Collection Time: 05/06/15 11:53 PM  Result Value Ref Range   Glucose-Capillary 119 (H) 65 - 99 mg/dL  Basic metabolic panel     Status: Abnormal   Collection Time: 05/07/15  3:02 AM  Result Value Ref Range   Sodium 139 135 - 145 mmol/L   Potassium 3.9 3.5 - 5.1 mmol/L   Chloride 108 101 - 111 mmol/L   CO2 23 22 - 32 mmol/L   Glucose, Bld 117 (H) 65 - 99 mg/dL   BUN 15 6 - 20 mg/dL   Creatinine, Ser 0.71 0.44 - 1.00 mg/dL   Calcium 8.3 (L) 8.9 - 10.3 mg/dL   GFR calc non Af Amer >60 >60 mL/min   GFR calc Af Amer >60 >60 mL/min    Comment: (NOTE) The eGFR has been calculated using the CKD EPI equation. This calculation has not been validated in all clinical situations. eGFR's persistently <60 mL/min signify possible Chronic Kidney Disease.    Anion gap 8 5 - 15  CBC     Status: Abnormal   Collection Time: 05/07/15  3:02 AM  Result Value Ref Range     WBC 8.5 4.0 - 10.5 K/uL   RBC 4.01 3.87 - 5.11 MIL/uL   Hemoglobin 7.5 (L) 12.0 - 15.0 g/dL   HCT 27.7 (L) 36.0 - 46.0 %   MCV 69.1 (L) 78.0 - 100.0 fL    Comment: REPEATED TO VERIFY   MCH 18.7 (L) 26.0 - 34.0 pg   MCHC 27.1 (L) 30.0 - 36.0 g/dL   RDW 23.1 (H) 11.5 - 15.5 %   Platelets 485 (H) 150 - 400 K/uL  Glucose, capillary     Status: Abnormal   Collection Time: 05/07/15  4:44 AM  Result Value Ref Range   Glucose-Capillary 123 (H) 65 - 99 mg/dL  CBC     Status: Abnormal   Collection Time: 05/07/15  7:44 AM  Result Value Ref Range   WBC 9.0 4.0 - 10.5 K/uL   RBC 4.10 3.87 - 5.11 MIL/uL   Hemoglobin 7.6 (L) 12.0 - 15.0 g/dL   HCT 28.5 (L) 36.0 - 46.0 %   MCV 69.5 (L) 78.0 - 100.0 fL   MCH 18.5 (L) 26.0 - 34.0 pg   MCHC 26.7 (L) 30.0 - 36.0 g/dL   RDW 22.9 (H) 11.5 - 15.5 %   Platelets 490 (H)  150 - 400 K/uL  Prepare RBC     Status: None   Collection Time: 05/07/15  8:00 AM  Result Value Ref Range   Order Confirmation ORDER PROCESSED BY BLOOD BANK   Glucose, capillary     Status: Abnormal   Collection Time: 05/07/15  8:09 AM  Result Value Ref Range   Glucose-Capillary 114 (H) 65 - 99 mg/dL   Comment 1 Notify RN    Comment 2 Document in Chart     Dg Chest 2 View  05/06/2015  CLINICAL DATA:  Vomiting, shortness of breath, cough EXAM: CHEST  2 VIEW COMPARISON:  03/27/2013, 08/03/2012 FINDINGS: Stable cardiomegaly with slight vascular congestion and chronic basilar atelectasis versus scarring. No acute CHF, edema pattern or pneumonia. No collapse or consolidation. No significant effusion or pneumothorax. Degenerative changes of the spine. No acute osseous finding. IMPRESSION: Stable cardiomegaly with slight vascular congestion and basilar atelectasis versus scarring. No interval change or superimposed acute process. Electronically Signed   By: M.  Shick M.D.   On: 05/06/2015 17:43              Blood pressure 143/83, pulse 99, temperature 98 F (36.7 C), temperature source Oral, resp. rate 18, height 5' 2" (1.575 m), weight 99.9 kg (220 lb 3.8 oz), SpO2 96 %.  Physical exam:   General-- obese white female in no acute distress full range of motion of all extremities ENT-- nonicteric mucous membrane somewhat dry Neck-- without lymphadenopathy Heart-- regular rate and rhythm without murmurs or gallops Lungs-- clear Abdomen-- soft and nontender Psych-- alert and oriented times 3 and answers questions appropriately Assessment: 1. G.I. bleeding in anticoagulated patients. Probably upper G.I. 2. Chronic anticoagulation  Plan: 1. Patient has had 2 doses of vitamin K to reverse her Coumadin. We will plan on proceeding later today with the GD to evaluate for upper G.I. source of bleeding. Have discussed this with her and she is agreeable.    JR, L 05/07/2015,  10:11 AM   Pager: [phone] If no answer or after hours call [phone]     

## 2015-05-07 NOTE — Op Note (Signed)
[facility] [place], [address]   ENDOSCOPY PROCEDURE REPORT  PATIENT: Nicole Gibbs, Nicole Gibbs  MR#: [id number] BIRTHDATE: Jan 31, 1946 , 72  yrs. old GENDER: female ENDOSCOPIST:Richards Pherigo Oletta Lamas, MD REFERRED BY: [facility] PROCEDURE DATE:  05/07/2015 PROCEDURE:   EGD ASA CLASS:    class III INDICATIONS: patient anticoagulated and came in withhemoglobin 6.2 MEDICATION: fentanyl 50 g versed 4 milligrams IV TOPICAL ANESTHETIC:   Cetacaine Spray  DESCRIPTION OF PROCEDURE:   After the risks and benefits of the procedure were explained, informed consent was obtained.  The Pentax Gastroscope [id number]  endoscope was introduced through the mouth  and advanced to the second portion of the duodenum .  The instrument was slowly withdrawn as the mucosa was fully examined. Estimated blood loss is zero unless otherwise noted in this procedure report. The 2nd duodenum and duodenal bulb were normal. The pyloric channel natural of the stomach were normal. The stomach was examined in the forward and Rex flex view and was normal with no active bleeding or signs of recent bleeding. The distal and proximal esophagus were normal. The scope was withdrawn the patient tolerated the procedure well    The scope was then withdrawn from the patient and the procedure completed.  COMPLICATIONS: There were no immediate complications.  ENDOSCOPIC IMPRESSION: 1. Anemia. No obvious source seen on upper G.I. endoscopy RECOMMENDATIONS: we will proceed with colonoscopy tomorrow.   _______________________________ Lorrin MaisLaurence Spates, MD 05/07/2015 3:03 PM   Dr. Ivar Bury  cc:  CPT CODES: ICD CODES:  The ICD and CPT codes recommended by this software are interpretations from the data that the clinical staff has captured with the software.  The verification of the translation of this report to the ICD and CPT codes and modifiers is the sole responsibility of  the health care institution and practicing physician where this report was generated.  [place]. will not be held responsible for the validity of the ICD and CPT codes included on this report.  AMA assumes no liability for data contained or not contained herein. CPT is a Designer, television/film set of the [employer].  PATIENT NAME:  [name], [name] MR#: [id number]

## 2015-05-07 NOTE — Interval H&P Note (Signed)
History and Physical Interval Note:  05/07/2015 2:31 PM  Nicole Gibbs  has presented today for surgery, with the diagnosis of GI bleed  The various methods of treatment have been discussed with the patient and family. After consideration of risks, benefits and other options for treatment, the patient has consented to  Procedure(s): ESOPHAGOGASTRODUODENOSCOPY (EGD) (N/A) as a surgical intervention .  The patient's history has been reviewed, patient examined, no change in status, stable for surgery.  I have reviewed the patient's chart and labs.  Questions were answered to the patient's satisfaction.     Brenson Hartman JR,Randall Rampersad L

## 2015-05-07 NOTE — Progress Notes (Signed)
[facility] PROGRESS NOTE  Nicole Gibbs [id number] DOB: 02-Sep-1945 DOA: 05/06/2015 PCP: [name], MD  Assessment/Plan: 1. 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 Presenting lab work revealed hemoglobin of 6.2 along with therapeutic INR of 2.8. -She denied taking NSAIDs, however, ibuprofen was listed on her medication reconciliation -Suspect anticoagulation and possibly NSAID therapy precipitated upper GI bleed. Anticoagulation has been stopped.  -She was started on IV Protonix infusion -[facility] consulted for endoscopy.   2.  Acute blood loss anemia -Likely secondary to upper GI bleed as she presented with complaints of black tarry stools in setting of chronic anticoagulation with Coumadin. -Initial labs showing hemoglobin of 6.2, improving to 7.5 after transfusion with 2 units of packed blood cells. -She remains symptomatically this morning, will plan for transfusion with an additional 2 units of packed red blood cells. -As mentioned above Coumadin has been discontinued, on Protonix IV, GI consulted. She is currently hemodynamically stable.  3.  History of pulmonary embolism/DVT. -Patient was diagnosed with pulmonary embolism/PE in 2014 and has been on Coumadin since. -She presents with symptomatic anemia having hemoglobin of 6.2 for which anticoagulation was discontinued. -Risks and benefits of restarting anticoagulation will need to be addressed in the outpatient setting by her cardiologist.  4.  Hypertension. -Antihypertensive agents held due to GI bleed and concerns for precipitating hypotension. -Law pressures remained stable.  5.  Chronic diastolic congestive heart failure -She had a neck oh performed at 2014  that revealed grade 1 diastolic dysfunction with preserved EF of 55-60%. -She does not appear to have evidence of volume overload, will provide IV Lasix in between transfusions. -Will continue monitoring volume status  Code Status: Full code Family Communication: Family of present Disposition Plan: GI consulted, will monitor closely in the step down unit.   Consultants:  GI    HPI/Subjective: Patient reports feeling weak with associated shortness of breath.  Objective: Filed Vitals:   05/07/15 0220 05/07/15 0327  BP:  115/81  Pulse: 100 106  Temp:  97.2 F (36.2 C)  Resp: 24 19    Intake/Output Summary (Last 24 hours) at 05/07/15 0803 Last data filed at 05/07/15 0600  Gross per 24 hour  Intake   1269 ml  Output    300 ml  Net    969 ml   Filed Weights   05/06/15 1917 05/06/15 2259  Weight: 99.791 kg (220 lb) 99.9 kg (220 lb 3.8 oz)    Exam:   General:  She is awake and alert, in no acute distress. States feeling weak  Cardiovascular: Tachycardic, regular rate rhythm normal S1-S2 no murmurs rubs or gallops  Respiratory: She has scattered exit 3 wheezes, good air movement, no crackles or rhonchi  Abdomen: Obese, soft nontender not distended  Musculoskeletal: No edema  Data Reviewed: Basic Metabolic Panel:  Recent Labs Lab 05/06/15 1712 05/06/15 1932 05/07/15 0302  NA 137 142 139  K 4.2 3.6 3.9  CL 102 108 108  CO2 25 25 23   GLUCOSE 107* 120* 117*  BUN 15 14 15   CREATININE 0.55 0.72 0.71  CALCIUM 8.7 8.7* 8.3*   Liver Function Tests:  Recent Labs Lab 05/06/15 1712  AST 15  ALT 9  ALKPHOS 62  BILITOT 1.7*  PROT 6.3  ALBUMIN 3.7  No results for input(s): LIPASE, AMYLASE in the last 168 hours. No results for input(s): AMMONIA in the last 168 hours. CBC:  Recent Labs Lab 05/06/15 1713 05/06/15 1932 05/07/15 0302  WBC 7.9 8.0 8.5  NEUTROABS  --  5.5  --   HGB 6.2* 6.2* 7.5*  HCT 22.5* 24.7* 27.7*  MCV 59.4* 63.8* 69.1*  PLT  --   569* 485*   Cardiac Enzymes: No results for input(s): CKTOTAL, CKMB, CKMBINDEX, TROPONINI in the last 168 hours. BNP (last 3 results) No results for input(s): BNP in the last 8760 hours.  ProBNP (last 3 results) No results for input(s): PROBNP in the last 8760 hours.  CBG:  Recent Labs Lab 05/06/15 2353 05/07/15 0444  GLUCAP 119* 123*    Recent Results (from the past 240 hour(s))  MRSA PCR Screening     Status: None   Collection Time: 05/06/15 11:00 PM  Result Value Ref Range Status   MRSA by PCR NEGATIVE NEGATIVE Final    Comment:        The GeneXpert MRSA Assay (FDA approved for NASAL specimens only), is one component of a comprehensive MRSA colonization surveillance program. It is not intended to diagnose MRSA infection nor to guide or monitor treatment for MRSA infections.      Studies: Dg Chest 2 View  05/06/2015  CLINICAL DATA:  Vomiting, shortness of breath, cough EXAM: CHEST  2 VIEW COMPARISON:  03/27/2013, 08/03/2012 FINDINGS: Stable cardiomegaly with slight vascular congestion and chronic basilar atelectasis versus scarring. No acute CHF, edema pattern or pneumonia. No collapse or consolidation. No significant effusion or pneumothorax. Degenerative changes of the spine. No acute osseous finding. IMPRESSION: Stable cardiomegaly with slight vascular congestion and basilar atelectasis versus scarring. No interval change or superimposed acute process. Electronically Signed   By: Jerilynn Mages.  Shick M.D.   On: 05/06/2015 17:43    Scheduled Meds: . sodium chloride   Intravenous Once  . antiseptic oral rinse  7 mL Mouth Rinse q12n4p  . chlorhexidine  15 mL Mouth Rinse BID  . furosemide  20 mg Intravenous Once   Continuous Infusions: . pantoprozole (PROTONIX) infusion 8 mg/hr (05/07/15 0600)    Principal Problem:   Acute GI bleeding Active Problems:   Acute blood loss anemia   History of pulmonary embolism    Time spent: 35 min    Kelvin Cellar  [facility] Pager [phone]. If 7PM-7AM, please contact night-coverage at [email or web address], password [facility] 05/07/2015, 8:03 AM  LOS: 1 day

## 2015-05-08 ENCOUNTER — Encounter (HOSPITAL_COMMUNITY)
Admission: EM | Disposition: A | Discharge status: Home Health | Payer: Self-pay | Source: Home / Self Care | Attending: Internal Medicine

## 2015-05-08 ENCOUNTER — Inpatient Hospital Stay (HOSPITAL_COMMUNITY): Payer: Medicare Other | Admitting: Anesthesiology

## 2015-05-08 ENCOUNTER — Encounter (HOSPITAL_COMMUNITY): Payer: Self-pay | Admitting: Gastroenterology

## 2015-05-08 HISTORY — PX: COLONOSCOPY: SHX5424

## 2015-05-08 LAB — GLUCOSE, CAPILLARY
GLUCOSE-CAPILLARY: 101 mg/dL — AB (ref 65–99)
GLUCOSE-CAPILLARY: 99 mg/dL (ref 65–99)
Glucose-Capillary: 98 mg/dL (ref 65–99)
Glucose-Capillary: 114 mg/dL — ABNORMAL HIGH (ref 65–99)
Glucose-Capillary: 110 mg/dL — ABNORMAL HIGH (ref 65–99)

## 2015-05-08 LAB — TYPE AND SCREEN
ABO/RH(D): O POS
Antibody Screen: NEGATIVE
UNIT DIVISION: 0
UNIT DIVISION: 0
Unit division: 0
Unit division: 0

## 2015-05-08 LAB — CBC
HCT: 32.1 % — ABNORMAL LOW (ref 36.0–46.0)
Hemoglobin: 9.3 g/dL — ABNORMAL LOW (ref 12.0–15.0)
MCH: 21 pg — AB (ref 26.0–34.0)
MCHC: 29 g/dL — AB (ref 30.0–36.0)
MCV: 72.5 fL — AB (ref 78.0–100.0)
PLATELETS: 396 10*3/uL (ref 150–400)
RBC: 4.43 MIL/uL (ref 3.87–5.11)
RDW: 23.9 % — AB (ref 11.5–15.5)
WBC: 10.9 10*3/uL — ABNORMAL HIGH (ref 4.0–10.5)

## 2015-05-08 LAB — MAGNESIUM: MAGNESIUM: 1.6 mg/dL — AB (ref 1.7–2.4)

## 2015-05-08 LAB — BASIC METABOLIC PANEL
Anion gap: 8 (ref 5–15)
BUN: 13 mg/dL (ref 6–20)
CALCIUM: 8.1 mg/dL — AB (ref 8.9–10.3)
CO2: 24 mmol/L (ref 22–32)
CREATININE: 0.58 mg/dL (ref 0.44–1.00)
Chloride: 108 mmol/L (ref 101–111)
GFR calc Af Amer: >60 mL/min (ref >60)
GLUCOSE: 101 mg/dL — AB (ref 65–99)
POTASSIUM: 3.3 mmol/L — AB (ref 3.5–5.1)
SODIUM: 140 mmol/L (ref 135–145)

## 2015-05-08 SURGERY — COLONOSCOPY
Anesthesia: Moderate Sedation

## 2015-05-08 SURGERY — COLONOSCOPY
Anesthesia: Monitor Anesthesia Care

## 2015-05-08 MED ORDER — LORAZEPAM 0.5 MG PO TABS
0.5000 mg | ORAL_TABLET | Freq: Once | ORAL | Status: AC
  Administered 2015-05-08: 0.5 mg via ORAL
  Filled 2015-05-08: qty 1

## 2015-05-08 MED ORDER — PROPOFOL 500 MG/50ML IV EMUL
INTRAVENOUS | Status: DC | PRN
  Administered 2015-05-08: 60 ug/kg/min via INTRAVENOUS

## 2015-05-08 MED ORDER — MAGNESIUM SULFATE IN D5W 10-5 MG/ML-% IV SOLN
1.0000 g | Freq: Once | INTRAVENOUS | Status: AC
  Administered 2015-05-08: 1 g via INTRAVENOUS
  Filled 2015-05-08: qty 100

## 2015-05-08 MED ORDER — METOPROLOL TARTRATE 50 MG PO TABS
50.0000 mg | ORAL_TABLET | Freq: Two times a day (BID) | ORAL | Status: DC
  Administered 2015-05-08 – 2015-05-11 (×6): 50 mg via ORAL
  Filled 2015-05-08 (×6): qty 1

## 2015-05-08 MED ORDER — POTASSIUM CHLORIDE CRYS ER 20 MEQ PO TBCR
40.0000 meq | EXTENDED_RELEASE_TABLET | Freq: Once | ORAL | Status: AC
  Administered 2015-05-08: 40 meq via ORAL
  Filled 2015-05-08: qty 2

## 2015-05-08 MED ORDER — LACTATED RINGERS IV SOLN
INTRAVENOUS | Status: DC | PRN
Start: 2015-05-08 — End: 2015-05-08
  Administered 2015-05-08: 14:00:00 via INTRAVENOUS

## 2015-05-08 NOTE — Op Note (Signed)
[facility] [place], [address]   COLONOSCOPY PROCEDURE REPORT  PATIENT: Nicole, Gibbs  MR#: [id number] BIRTHDATE: October 19, 1945 , 64  yrs. old GENDER: female ENDOSCOPIST: Laurence Spates, MD REFERRED BY:  [facility] PCP: Dr Helane Rima PROCEDURE DATE:  05/30/15 PROCEDURE:   Colonoscopy ASA CLASS:   class III INDICATIONS:G.I. bleeding and anticoagulated patient MEDICATIONS: propofol 100 mg  DESCRIPTION OF PROCEDURE:   After the risks and benefits and of the procedure were explained, informed consent was obtained.  digital exam was normal         The Pentax Adult Colonscope [id number] endoscope was introduced through the anus and advanced to the cecum identified by ICV and appendiceal orifice      .  The quality of the prep was fair some solid stool remain      .  The instrument was then slowly withdrawn as the colon was fully examined. Estimated blood loss is zero unless otherwise noted in this procedure report. The coal was carefully examined upon withdrawal. The cecum and AC were seen well. TC, DC, and [place] were seen well as well and there were no polyps or other lesion seen. It was some inherent stool we were able to wash this away fairly well. The rectum was normal. The patient tolerated the procedure well.   The scope was then withdrawn from the patient and the procedure completed.  WITHDRAWAL TIME:  COMPLICATIONS: There were no immediate complications. ENDOSCOPIC IMPRESSION: 1. Anemia and anticoagulant patient no obvious source of bleeding seen RECOMMENDATIONS: 1. anticoagulation can be resumed. 2. Recommend routine colonoscopy screening repeat procedure in 10 years and begin stool for FOB and 4 to 5 years  REPEAT EXAM:  cc:  _______________________________ Lorrin MaisLaurence Spates, MD 05-30-15 3:04 PM   CPT CODES: ICD CODES:  The ICD and CPT codes recommended by this software are interpretations from the data that the  clinical staff has captured with the software.  The verification of the translation of this report to the ICD and CPT codes and modifiers is the sole responsibility of the health care institution and practicing physician where this report was generated.  [place]. will not be held responsible for the validity of the ICD and CPT codes included on this report.  AMA assumes no liability for data contained or not contained herein. CPT is a Designer, television/film set of the [employer].

## 2015-05-08 NOTE — Transfer of Care (Signed)
Immediate Anesthesia Transfer of Care Note  Patient: Nicole Gibbs  Procedure(s) Performed: Procedure(s): COLONOSCOPY (N/A)  Patient Location: PACU  Anesthesia Type:MAC  Level of Consciousness: awake, alert , oriented and patient cooperative  Airway & Oxygen Therapy: Patient Spontanous Breathing and Patient connected to face mask oxygen  Post-op Assessment: Report given to RN and Post -op Vital signs reviewed and stable  Post vital signs: Reviewed and stable  Last Vitals:  Filed Vitals:   05/08/15 1250 05/08/15 1505  BP: 176/98 133/59  Pulse:  90  Temp:    Resp:  25    Complications: No apparent anesthesia complications

## 2015-05-08 NOTE — Interval H&P Note (Signed)
History and Physical Interval Note:  05/08/2015 2:14 PM  Nicole Gibbs  has presented today for surgery, with the diagnosis of GI bleed  The various methods of treatment have been discussed with the patient and family. After consideration of risks, benefits and other options for treatment, the patient has consented to  Procedure(s): COLONOSCOPY (N/A) as a surgical intervention .  The patient's history has been reviewed, patient examined, no change in status, stable for surgery.  I have reviewed the patient's chart and labs.  Questions were answered to the patient's satisfaction.     Kinta Martis JR,Zasha Belleau L

## 2015-05-08 NOTE — Progress Notes (Signed)
Pt back from colonoscopy and report called to [place], pt transferred in wheelchair with O2. Consuelo Pandy RN

## 2015-05-08 NOTE — Progress Notes (Signed)
[facility] PROGRESS NOTE  Nicole Gibbs [id number] DOB: 13-Nov-1945 DOA: 05/06/2015 PCP: [name], MD  Assessment/Plan: 1. 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 Presenting lab work revealed hemoglobin of 6.2 along with therapeutic INR of 2.8. -She denied taking NSAIDs, however, ibuprofen was listed on her medication reconciliation -Suspect anticoagulation and possibly NSAID therapy precipitated upper GI bleed. Anticoagulation has been stopped.  -She was started on IV Protonix infusion -[facility] consulted for upper endoscopy which was performed on 05/07/2015 she did not reveal an obvious source of bleed, from upper GI tract. -Dr Oletta Lamas recommending pursuing colonoscopy which is planned for 05/08/2015. She has not had further episodes of black tarry stools during this hospitalization.  2.  Acute blood loss anemia -Likely secondary to upper GI bleed as she presented with complaints of black tarry stools in setting of chronic anticoagulation with Coumadin. -Coumadin was reversed. -Initial labs showing hemoglobin of 6.2, improving to 7.5 after transfusion with 2 units of packed blood cells. 2 additional units of blood were given. -She remains symptomatically  -He was transfused with a total of 4 units of packed red blood cells -A.m. lab work now showing improved hemoglobin of 9.3. -Has not had further episodes of melena.  3.  History of pulmonary embolism/DVT. -Patient was diagnosed with pulmonary embolism/PE in 2014 and has been on Coumadin since. -She presents with symptomatic anemia having hemoglobin of 6.2 for which anticoagulation was discontinued. -Risks and benefits of restarting anticoagulation will need to be addressed in  the outpatient setting by her cardiologist.  4.  Hypertension. -Antihypertensive agents held due to GI bleed and concerns for precipitating hypotension. -On 05/08/2015 blood pressures elevated for which metoprolol 50 mg by mouth twice a day was restarted   5.  Chronic diastolic congestive heart failure -She had an echo performed at 2014 that revealed grade 1 diastolic dysfunction with preserved EF of 55-60%. -She does not appear to have evidence of volume overload, will provided IV Lasix in between transfusions. -Will continue monitoring volume status  Code Status: Full code Family Communication: Family of present Disposition Plan: She is hemodynamically stable will transfer to [place].   Consultants:  GI  HPI/Subjective: Patient is a pleasant 69 year old with a history of DVT/PE who had been anticoagulated with Coumadin presented with complaints of black stools. She also had reported generalized weakness and poor tolerance to physical exertion over the past month. Labs revealed hemoglobin of 6.20 which she was transfused with a total of 4 units of packed red blood cells during this hospitalization. Anticoagulation was reversed with vitamin K. GI bleed was suspected as gastroenterology was consulted. On 05/07/2015 she underwent upper endoscopy which was unrevealing. Plan for colonoscopy on 05/08/2015.  Objective: Filed Vitals:   05/08/15 0730 05/08/15 0800  BP: 153/86   Pulse: 88   Temp:  98.1 F (36.7 C)  Resp: 30     Intake/Output Summary (Last 24 hours) at 05/08/15 1039 Last data filed at 05/08/15 0800  Gross per 24 hour  Intake 2524.08 ml  Output    325 ml  Net 2199.08 ml   Filed Weights   05/06/15 1917 05/06/15 2259  Weight: 99.791 kg (220 lb) 99.9 kg (220 lb 3.8 oz)    Exam:   General:  She is awake and  alert, in no acute distress.   Cardiovascular: Tachycardic, regular rate rhythm normal S1-S2 no murmurs rubs or gallops  Respiratory: She has scattered exit 3  wheezes, good air movement, no crackles or rhonchi  Abdomen: Obese, soft nontender not distended  Musculoskeletal: No edema  Data Reviewed: Basic Metabolic Panel:  Recent Labs Lab 05/06/15 1712 05/06/15 1932 05/07/15 0302 05/08/15 0256 05/08/15 0500  NA 137 142 139 140  --   K 4.2 3.6 3.9 3.3*  --   CL 102 108 108 108  --   CO2 25 25 23 24   --   GLUCOSE 107* 120* 117* 101*  --   BUN 15 14 15 13   --   CREATININE 0.55 0.72 0.71 0.58  --   CALCIUM 8.7 8.7* 8.3* 8.1*  --   MG  --   --   --   --  1.6*   Liver Function Tests:  Recent Labs Lab 05/06/15 1712  AST 15  ALT 9  ALKPHOS 62  BILITOT 1.7*  PROT 6.3  ALBUMIN 3.7   No results for input(s): LIPASE, AMYLASE in the last 168 hours. No results for input(s): AMMONIA in the last 168 hours. CBC:  Recent Labs Lab 05/06/15 1713 05/06/15 1932 05/07/15 0302 05/07/15 0744 05/08/15 0256  WBC 7.9 8.0 8.5 9.0 10.9*  NEUTROABS  --  5.5  --   --   --   HGB 6.2* 6.2* 7.5* 7.6* 9.3*  HCT 22.5* 24.7* 27.7* 28.5* 32.1*  MCV 59.4* 63.8* 69.1* 69.5* 72.5*  PLT  --  569* 485* 490* 396   Cardiac Enzymes: No results for input(s): CKTOTAL, CKMB, CKMBINDEX, TROPONINI in the last 168 hours. BNP (last 3 results) No results for input(s): BNP in the last 8760 hours.  ProBNP (last 3 results) No results for input(s): PROBNP in the last 8760 hours.  CBG:  Recent Labs Lab 05/07/15 1118 05/07/15 2024 05/08/15 0013 05/08/15 0436 05/08/15 0751  GLUCAP 97 163* 99 98 114*    Recent Results (from the past 240 hour(s))  MRSA PCR Screening     Status: None   Collection Time: 05/06/15 11:00 PM  Result Value Ref Range Status   MRSA by PCR NEGATIVE NEGATIVE Final    Comment:        The GeneXpert MRSA Assay (FDA approved for NASAL specimens only), is one component of a comprehensive MRSA colonization surveillance program. It is not intended to diagnose MRSA infection nor to guide or monitor treatment for MRSA infections.       Studies: Dg Chest 2 View  05/06/2015  CLINICAL DATA:  Vomiting, shortness of breath, cough EXAM: CHEST  2 VIEW COMPARISON:  03/27/2013, 08/03/2012 FINDINGS: Stable cardiomegaly with slight vascular congestion and chronic basilar atelectasis versus scarring. No acute CHF, edema pattern or pneumonia. No collapse or consolidation. No significant effusion or pneumothorax. Degenerative changes of the spine. No acute osseous finding. IMPRESSION: Stable cardiomegaly with slight vascular congestion and basilar atelectasis versus scarring. No interval change or superimposed acute process. Electronically Signed   By: Jerilynn Mages.  Shick M.D.   On: 05/06/2015 17:43    Scheduled Meds: . antiseptic oral rinse  7 mL Mouth Rinse q12n4p  . chlorhexidine  15 mL Mouth Rinse BID   Continuous Infusions: . sodium chloride 20 mL/hr at 05/08/15 0730  . pantoprozole (PROTONIX) infusion 8 mg/hr (05/08/15 0730)    Principal Problem:   Acute GI bleeding Active Problems:   Acute blood loss anemia   History of pulmonary  embolism    Time spent: 67 min    Kelvin Cellar  [facility] Pager [phone]. If 7PM-7AM, please contact night-coverage at [email or web address], password [facility] 05/08/2015, 10:39 AM  LOS: 2 days

## 2015-05-08 NOTE — Progress Notes (Signed)
PT Cancellation Note  Patient Details Name: Nicole Gibbs MRN: [id number] DOB: 07-21-1945   Cancelled Treatment:    Reason Eval/Treat Not Completed: Patient at procedure or test/unavailable.  Pt is in endo.  PT will check back at later date/time.   Thanks,    Barbarann Ehlers. Cerina Leary, PT, DPT [phone]   05/08/2015, 2:13 PM

## 2015-05-08 NOTE — Progress Notes (Signed)
Pt had 7 beat run of Vtach at 0238.  Pt sleeping, VSS.  No complaints of chest pain or dyspnea.  Will continue to monitor closely.

## 2015-05-08 NOTE — Anesthesia Preprocedure Evaluation (Addendum)
Anesthesia Evaluation  Patient identified by MRN, date of birth, ID band Patient awake    Reviewed: Allergy & Precautions, NPO status , Patient's Chart, lab work & pertinent test results  History of Anesthesia Complications Negative for: history of anesthetic complications  Airway Mallampati: III  TM Distance: >3 FB Neck ROM: Full    Dental  (+) Teeth Intact, Dental Advisory Given   Pulmonary shortness of breath, neg sleep apnea, neg recent URI, neg PE    + decreased breath sounds      Cardiovascular hypertension, Pt. on medications and Pt. on home beta blockers (-) angina+ Peripheral Vascular Disease and +CHF  (-) Past MI + Valvular Problems/Murmurs AI  Rhythm:Regular     Neuro/Psych negative neurological ROS  negative psych ROS   GI/Hepatic   Endo/Other  Morbid obesity  Renal/GU      Musculoskeletal  (+) Arthritis ,   Abdominal   Peds  Hematology  (+) anemia ,   Anesthesia Other Findings   Reproductive/Obstetrics                           Anesthesia Physical Anesthesia Plan  ASA: III  Anesthesia Plan: MAC   Post-op Pain Management:    Induction: Intravenous  Airway Management Planned: Natural Airway, Nasal Cannula and Simple Face Mask  Additional Equipment: None  Intra-op Plan:   Post-operative Plan:   Informed Consent: I have reviewed the patients History and Physical, chart, labs and discussed the procedure including the risks, benefits and alternatives for the proposed anesthesia with the patient or authorized representative who has indicated his/her understanding and acceptance.     Plan Discussed with: CRNA and Surgeon  Anesthesia Plan Comments:         Anesthesia Quick Evaluation

## 2015-05-08 NOTE — Progress Notes (Signed)
UR COMPLETED  

## 2015-05-09 ENCOUNTER — Inpatient Hospital Stay (HOSPITAL_COMMUNITY): Payer: Medicare Other

## 2015-05-09 DIAGNOSIS — I10 Essential (primary) hypertension: Secondary | ICD-10-CM

## 2015-05-09 DIAGNOSIS — I5032 Chronic diastolic (congestive) heart failure: Secondary | ICD-10-CM

## 2015-05-09 LAB — GLUCOSE, CAPILLARY
GLUCOSE-CAPILLARY: 142 mg/dL — AB (ref 65–99)
GLUCOSE-CAPILLARY: 118 mg/dL — AB (ref 65–99)
GLUCOSE-CAPILLARY: 148 mg/dL — AB (ref 65–99)
Glucose-Capillary: 132 mg/dL — ABNORMAL HIGH (ref 65–99)
Glucose-Capillary: 141 mg/dL — ABNORMAL HIGH (ref 65–99)
Glucose-Capillary: 141 mg/dL — ABNORMAL HIGH (ref 65–99)
Glucose-Capillary: 155 mg/dL — ABNORMAL HIGH (ref 65–99)

## 2015-05-09 LAB — CBC
HCT: 32.3 % — ABNORMAL LOW (ref 36.0–46.0)
Hemoglobin: 9.2 g/dL — ABNORMAL LOW (ref 12.0–15.0)
MCH: 21.1 pg — AB (ref 26.0–34.0)
MCHC: 28.5 g/dL — AB (ref 30.0–36.0)
MCV: 74.1 fL — AB (ref 78.0–100.0)
PLATELETS: 379 10*3/uL (ref 150–400)
RBC: 4.36 MIL/uL (ref 3.87–5.11)
RDW: 25.3 % — AB (ref 11.5–15.5)
WBC: 9 10*3/uL (ref 4.0–10.5)

## 2015-05-09 LAB — BASIC METABOLIC PANEL
Anion gap: 6 (ref 5–15)
BUN: 16 mg/dL (ref 6–20)
CALCIUM: 8.3 mg/dL — AB (ref 8.9–10.3)
CHLORIDE: 106 mmol/L (ref 101–111)
CO2: 28 mmol/L (ref 22–32)
CREATININE: 0.88 mg/dL (ref 0.44–1.00)
GFR calc non Af Amer: >60 mL/min (ref >60)
GLUCOSE: 145 mg/dL — AB (ref 65–99)
Potassium: 3.6 mmol/L (ref 3.5–5.1)
Sodium: 140 mmol/L (ref 135–145)

## 2015-05-09 LAB — PROTIME-INR
INR: 1.55 — AB (ref 0.00–1.49)
PROTHROMBIN TIME: 18.6 s — AB (ref 11.6–15.2)

## 2015-05-09 MED ORDER — WARFARIN SODIUM 4 MG PO TABS
4.5000 mg | ORAL_TABLET | Freq: Once | ORAL | Status: AC
  Administered 2015-05-09: 4.5 mg via ORAL
  Filled 2015-05-09 (×2): qty 1

## 2015-05-09 MED ORDER — PANTOPRAZOLE SODIUM 40 MG PO TBEC
40.0000 mg | DELAYED_RELEASE_TABLET | Freq: Two times a day (BID) | ORAL | Status: DC
  Administered 2015-05-09 – 2015-05-11 (×5): 40 mg via ORAL
  Filled 2015-05-09 (×5): qty 1

## 2015-05-09 MED ORDER — LORAZEPAM 0.5 MG PO TABS
0.5000 mg | ORAL_TABLET | Freq: Once | ORAL | Status: AC
  Administered 2015-05-09: 0.5 mg via ORAL
  Filled 2015-05-09: qty 1

## 2015-05-09 MED ORDER — WARFARIN - PHARMACIST DOSING INPATIENT
Freq: Every day | Status: DC
  Administered 2015-05-10: 19:00:00

## 2015-05-09 NOTE — Anesthesia Postprocedure Evaluation (Signed)
Anesthesia Post Note  Patient: Nicole Gibbs  Procedure(s) Performed: Procedure(s) (LRB): COLONOSCOPY (N/A)  Patient location during evaluation: PACU Anesthesia Type: MAC Level of consciousness: awake and alert Pain management: pain level controlled Vital Signs Assessment: post-procedure vital signs reviewed and stable Respiratory status: spontaneous breathing, nonlabored ventilation, respiratory function stable and patient connected to nasal cannula oxygen Cardiovascular status: stable and blood pressure returned to baseline Anesthetic complications: no    Last Vitals:  Filed Vitals:   05/09/15 0842 05/09/15 1011  BP: 145/79   Pulse: 76 72  Temp:    Resp:      Last Pain:  Filed Vitals:   05/09/15 1011  PainSc: 0-No pain                 Catalina Gravel

## 2015-05-09 NOTE — Progress Notes (Signed)
Pt says she was anxious around midnight. MD notified, one time Ativan was ordered and administered. Pt says she is still anxious after 2-3 hrs. She says she wanted something else for the anxiety. MD notified.

## 2015-05-09 NOTE — Progress Notes (Signed)
ANTICOAGULATION CONSULT NOTE - Initial Consult  Pharmacy Consult for Coumadin Indication: Hx DVT/PE  Allergies  Allergen Reactions  . Sulfa Antibiotics Hives    Patient Measurements: Height: 5\' 2"  (157.5 cm) Weight: 227 lb 8.2 oz (103.2 kg) IBW/kg (Calculated) : 50.1  Vital Signs: Temp: 97.5 F (36.4 C) (12/24 0602) Temp Source: Oral (12/24 0602) BP: 145/79 mmHg (12/24 0842) Pulse Rate: 72 (12/24 1011)  Labs:  Recent Labs  05/06/15 1504  05/06/15 1932 05/07/15 0302 05/07/15 0744 05/08/15 0256 05/09/15 0355 05/09/15 1054  HGB  --   < > 6.2* 7.5* 7.6* 9.3* 9.2*  --   HCT  --   < > 24.7* 27.7* 28.5* 32.1* 32.3*  --   PLT  --   < > 569* 485* 490* 396 379  --   LABPROT  --   --  27.0*  --   --   --   --  18.6*  INR 2.8  --  2.53*  --   --   --   --  1.55*  CREATININE  --   < > 0.72 0.71  --  0.58 0.88  --   < > = values in this interval not displayed.  Estimated Creatinine Clearance: 69.8 mL/min (by C-G formula based on Cr of 0.88).   Medical History: Past Medical History  Diagnosis Date  . Thyroid nodule     no meds  . Hypertension   . Pulmonary embolism, bilateral ([place])   . DVT, bilateral lower limbs ([place])   . Shortness of breath   . Arthritis     knees  . Postmenopausal vaginal bleeding   . Rash     under breasts  . Nocturia   . Obese   . Diastolic dysfunction     grade 1  . Aortic regurgitation     mild  . Mitral valve annular calcification     mild  . Hypokinesis     EF 51%    Assessment: 78 YOF admitted 05/06/2015  with GI bleed and Hgb of 6. Pt with PMH of DVT/PE in 2014 on coumadin PTA. PTA dose: 3mg  MTWFS and 4.5mg  on SunTh. Pharmacy consulted to re-start coumadin. Will not plan to bridge at this time.   INR 3.1 on admit  S/p vit K 2.5 on 12/21, 10 mg on 12/22  INR down to 1.55 today  Goal of Therapy:  INR 2-3 Monitor platelets by anticoagulation protocol: Yes   Plan:  Coumadin 4.5mg  x1 tonight  Daily INR/CBC  Monitor for s/s of  bleeding   Truman Aceituno C. Lennox Grumbles, PharmD Pharmacy Resident  Pager: [phone] 05/09/2015 12:30 PM

## 2015-05-09 NOTE — Evaluation (Signed)
Physical Therapy Evaluation Patient Details Name: Nicole Gibbs MRN: [id number] DOB: [birth year] Today's Date: 05/09/2015   History of Present Illness  69 y.o. female admitted to [facility] on 05/06/15 for SOB.  Pt dx with acute GIB with symptomatic anemia.  Pt with significant PMhx of PE, DVT, HTN, diastolic dysfunction, aortic regurgitation, and mitral valve annular calcification.  Clinical Impression  Pt is unsteady on her feet and unable to maintain O2 sats on RA >90%.  See details in gait below.  She may need to go home with home O2 and HHPT f/u.   PT to follow acutely for deficits listed below.       Follow Up Recommendations Home health PT;Supervision for mobility/OOB    Equipment Recommendations  Other (comment) (home O2?)    Recommendations for Other Services   NA    Precautions / Restrictions Precautions Precautions: Fall;Other (comment) Precaution Comments: monitor O2 sats.      Mobility  Bed Mobility Overal bed mobility: Modified Independent             General bed mobility comments: Pt able to get EOB unassisted.  She did use the bed rail for leverage at her trunk.   Transfers Overall transfer level: Needs assistance Equipment used: Rolling walker (2 wheeled);None Transfers: Sit to/from Stand Sit to Stand: Min guard         General transfer comment: Min guard assist for balance.  We had to sit EOB for a minute due to lightheadedness and took the same precaution in standing.   Ambulation/Gait Ambulation/Gait assistance: Min assist;Min guard Ambulation Distance (Feet): 75 Feet Assistive device: Rolling walker (2 wheeled);None Gait Pattern/deviations: Step-through pattern;Staggering left;Staggering right Gait velocity: decreased Gait velocity interpretation: Below normal speed for age/gender General Gait Details: Pt with staggering gait pattern when attempting to walk without RW just holding to IV pole.  Min assist needed without an assistive device.  Pt had to  take a sitting rest break due to weakness and reports of DOE 2/4.  O2 sats checked and they were in the mid 80s.  Instructed pt in PLB and she was unable to get O2 sats >87-88% on RA.  RN made aware and O2 3 L applied before she was able to get O2 sats back into the 90s.  HR stable in the 70s throughout.  Pt used RW to walk back to room after seated rest break and was min guard assist for safety primarily due to running into obstacles with the RW.           Balance Overall balance assessment: Needs assistance Sitting-balance support: Feet supported;No upper extremity supported Sitting balance-Leahy Scale: Good     Standing balance support: Bilateral upper extremity supported;Single extremity supported;No upper extremity supported Standing balance-Leahy Scale: Fair                               Pertinent Vitals/Pain Pain Assessment: No/denies pain    Home Living Family/patient expects to be discharged to:: Private residence Living Arrangements: Spouse/significant other;Children Available Help at Discharge: Family;Available PRN/intermittently (husband has MS and is bed bound, daughter is 37 y.o.) Type of Home: House (townhome) Home Access: Ramped entrance     Home Layout: Two level;Bed/bath upstairs;Other (Comment) (husband's hospital bed/room is downstairs) Home Equipment: Walker - 2 wheels;Walker - standard;Other (comment) (pt has a lot of equipment from when her husband was using it)      Prior Function Level of Independence:  Independent         Comments: PTA pt did not use any assistive devices, she did notice that she gets extremely SOB when walking up the hill to her home from the parking lot.  She has to stop and rest three times.         Extremity/Trunk Assessment   Upper Extremity Assessment: Generalized weakness           Lower Extremity Assessment: Generalized weakness      Cervical / Trunk Assessment: Normal  Communication    Communication: No difficulties  Cognition Arousal/Alertness: Awake/alert Behavior During Therapy: WFL for tasks assessed/performed Overall Cognitive Status: Within Functional Limits for tasks assessed                               Assessment/Plan    PT Assessment Patient needs continued PT services  PT Diagnosis Difficulty walking;Abnormality of gait;Generalized weakness   PT Problem List Decreased strength;Decreased activity tolerance;Decreased balance;Decreased mobility;Decreased knowledge of use of DME;Cardiopulmonary status limiting activity  PT Treatment Interventions DME instruction;Gait training;Stair training;Functional mobility training;Therapeutic activities;Therapeutic exercise;Balance training;Neuromuscular re-education;Patient/family education   PT Goals (Current goals can be found in the Care Plan section) Acute Rehab PT Goals Patient Stated Goal: to get her breathing back to being better PT Goal Formulation: With patient Time For Goal Achievement: 05/23/15 Potential to Achieve Goals: Good    Frequency Min 3X/week           End of Session Equipment Utilized During Treatment: Gait belt Activity Tolerance: Patient limited by fatigue Patient left: in chair;with call bell/phone within reach;with chair alarm set Nurse Communication: Mobility status;Other (comment) (decreased O2 sats)         Time: 6644-0347 PT Time Calculation (min) (ACUTE ONLY): 30 min   Charges:   PT Evaluation $Initial PT Evaluation Tier I: 1 Procedure PT Treatments $Gait Training: 8-22 mins        Terressa Evola B. Lesleigh Hughson, PT, DPT [phone]   05/09/2015, 10:23 AM

## 2015-05-09 NOTE — Progress Notes (Signed)
05/09/2015 SATURATION QUALIFICATIONS: (This note is used to comply with regulatory documentation for home oxygen)  Patient Saturations on Room Air at Rest = 88%  Patient Saturations on Room Air while Ambulating = 85%  Patient Saturations on 3 Liters of oxygen while Ambulating = 98%  Please briefly explain why patient needs home oxygen: Pt is unable to maintain O2 sats >90 even at rest in the room on RA even with attempts at deep pursed lip breathing.  O2 applied to get her O2 sats >90%.

## 2015-05-09 NOTE — Progress Notes (Signed)
[facility]                                                                              Patient Demographics  Nicole Gibbs, is a 69 y.o. female, DOB - 06-May-1946, ZHY:[id number]  Admit date - 05/06/2015   Admitting Physician Eduard Clos, MD  Outpatient Primary MD for the patient is Jeani Hawking, MD  LOS - 3   Chief Complaint  Patient presents with  . Rectal Bleeding  . Cough      HPI on 05/06/2015 by Dr. Midge Minium Nicole Gibbs is a 69 y.o. female with history of PE on Coumadin, hypertension closely on no medications had gone to the urgent care center because of shortness of breath. Lab work showed patient had hemoglobin of 6 which was a drop of 6 g from previous. Stool for blood showed melanotic stool. Patient was referred to the ER. Patient states over the last 4 days patient has noticed black tarry stools. Denies any abdominal pain or chest pain. Denies using any NSAIDs. Patient's INR is therapeutic. At this time patient has been admitted for acute GI bleed most likely upper GI. Patient is hemodynamically stable. Denies any hematemesis. Patient states she has not had any previous episodes of GI bleed and has not had any endoscopies.   Assessment & Plan   Suspected GI Bleed -Patient does have history of DVT and PE she is anticoagulated with Coumadin, her hemoglobin was 6.2 upon admission with INR of 2.8 (she was given vitamin K for reversal) -[facility] consultation appreciated, patient had upper endoscopy 05/07/2015 which did not reveal obvious source of bleeding from the upper GI tract. -Colonoscopy 05/08/2015: No obvious source of bleeding seen, anticoagulation can be resumed, recommended routine colonoscopy screening repeat procedure in 10 years with occult testing in 4-5 years -Gastroenterology recommended continuing anticoagulation -Continue Protonix 40 mg twice daily  Acute blood loss anemia -Secondary to the above, hemoglobin upon admission was  6.2 -she received 4 units PRBC, hemoglobin currently 9.2 -Continue to monitor CBC  History of pulmonary embolism/DVT -Occurred in 2014, patient is on Coumadin -Will resume patient's Coumadin therapy tonight  Essential hypertension -Antihypertensive medications were initially held due to GI bleed concerns of hypotension -Continue metoprolol  Chronic diastolic heart failure -Echocardiogram 2014 showed an EF of 55-60%, grade 1 diastolic dysfunction -Chronic evidence of volume overload  Cough -Patient states she has had a cough for several days -Obtained chest x-ray, linear bibasilar opacities reflecting segmental atelectasis -Patient currently does not have any leukocytosis and is afebrile, will not start antibiotics at this time  Code Status: Full  Family Communication: None at bedside  Disposition Plan: Admitted. Will restart coumadin and monitor H/H  Time Spent in minutes   30 minutes  Procedures  EGD Colonoscopy  Consults   Gastroenterology  DVT Prophylaxis  Warfarin  Lab Results  Component Value Date   PLT 379 05/09/2015    Medications  Scheduled Meds: . antiseptic oral rinse  7 mL Mouth Rinse q12n4p  . chlorhexidine  15 mL Mouth Rinse BID  . metoprolol tartrate  50 mg Oral BID  . pantoprazole  40 mg Oral BID  . warfarin  4.5  mg Oral ONCE-1800  . Warfarin - Pharmacist Dosing Inpatient   Does not apply q1800   Continuous Infusions:  PRN Meds:.acetaminophen **OR** acetaminophen, nitroGLYCERIN, ondansetron **OR** ondansetron (ZOFRAN) IV  Antibiotics    Anti-infectives    None     Subjective:   [name] seen and examined today.  Patient complains of cough.  Denies chest pain, shortness of breath, abdominal pain, N/V/D/C.    Objective:   Filed Vitals:   05/08/15 2109 05/09/15 0602 05/09/15 0842 05/09/15 1011  BP: 140/62 119/92 145/79   Pulse: 41 78 76 72  Temp: 98.1 F (36.7 C) 97.5 F (36.4 C)    TempSrc: Oral Oral    Resp: 18 18     Height:      Weight:      SpO2: 100% 91%  85%    Wt Readings from Last 3 Encounters:  05/08/15 103.2 kg (227 lb 8.2 oz)  05/06/15 100.154 kg (220 lb 12.8 oz)  11/04/14 107.185 kg (236 lb 4.8 oz)     Intake/Output Summary (Last 24 hours) at 05/09/15 1248 Last data filed at 05/09/15 4696  Gross per 24 hour  Intake  442.5 ml  Output      0 ml  Net  442.5 ml    Exam  General: Well developed, well nourished, NAD, appears stated age  HEENT: NCAT,mucous membranes moist.   Cardiovascular: S1 S2 auscultated, no rubs, murmurs or gallops. Regular rate and rhythm.  Respiratory: Clear to auscultation bilaterally  Abdomen: Soft, nontender, nondistended, + bowel sounds  Extremities: warm dry without cyanosis clubbing or edema  Neuro: AAOx3, nonfocal  Psych: Normal affect and demeanor   Data Review   Micro Results Recent Results (from the past 240 hour(s))  MRSA PCR Screening     Status: None   Collection Time: 05/06/15 11:00 PM  Result Value Ref Range Status   MRSA by PCR NEGATIVE NEGATIVE Final    Comment:        The GeneXpert MRSA Assay (FDA approved for NASAL specimens only), is one component of a comprehensive MRSA colonization surveillance program. It is not intended to diagnose MRSA infection nor to guide or monitor treatment for MRSA infections.     Radiology Reports Dg Chest 2 View  05/06/2015  CLINICAL DATA:  Vomiting, shortness of breath, cough EXAM: CHEST  2 VIEW COMPARISON:  03/27/2013, 08/03/2012 FINDINGS: Stable cardiomegaly with slight vascular congestion and chronic basilar atelectasis versus scarring. No acute CHF, edema pattern or pneumonia. No collapse or consolidation. No significant effusion or pneumothorax. Degenerative changes of the spine. No acute osseous finding. IMPRESSION: Stable cardiomegaly with slight vascular congestion and basilar atelectasis versus scarring. No interval change or superimposed acute process. Electronically Signed   By:  Judie Petit.  Shick M.D.   On: 05/06/2015 17:43   Dg Chest Port 1 View  05/09/2015  CLINICAL DATA:  69 year old female with cough EXAM: PORTABLE CHEST 1 VIEW COMPARISON:  Prior chest x-ray 05/06/2015 FINDINGS: Similar to slightly increased cardiomegaly. Pulmonary vascular congestion without overt edema. No focal scratch then no pleural effusion, focal airspace consolidation or pneumothorax. Linear bibasilar opacities favored to reflect subsegmental atelectasis. No acute osseous abnormality. IMPRESSION: 1. Linear bibasilar opacities favored to reflect subsegmental atelectasis. Small volume infiltrate would be difficult to exclude radiographically. 2. Slightly enlarged appearance of the cardiac silhouette is likely technique related (portable frontal view). The degree of cardiomegaly is likely unchanged. 3. Stable mild vascular congestion without edema. Electronically Signed   By: Malachy Moan  M.D.   On: 05/09/2015 10:58    CBC  Recent Labs Lab 05/06/15 1932 05/07/15 0302 05/07/15 0744 05/08/15 0256 05/09/15 0355  WBC 8.0 8.5 9.0 10.9* 9.0  HGB 6.2* 7.5* 7.6* 9.3* 9.2*  HCT 24.7* 27.7* 28.5* 32.1* 32.3*  PLT 569* 485* 490* 396 379  MCV 63.8* 69.1* 69.5* 72.5* 74.1*  MCH 16.0* 18.7* 18.5* 21.0* 21.1*  MCHC 25.1* 27.1* 26.7* 29.0* 28.5*  RDW 18.7* 23.1* 22.9* 23.9* 25.3*  LYMPHSABS 1.4  --   --   --   --   MONOABS 0.9  --   --   --   --   EOSABS 0.1  --   --   --   --   BASOSABS 0.1  --   --   --   --     Chemistries   Recent Labs Lab 05/06/15 1712 05/06/15 1932 05/07/15 0302 05/08/15 0256 05/08/15 0500 05/09/15 0355  NA 137 142 139 140  --  140  K 4.2 3.6 3.9 3.3*  --  3.6  CL 102 108 108 108  --  106  CO2 25 25 23 24   --  28  GLUCOSE 107* 120* 117* 101*  --  145*  BUN 15 14 15 13   --  16  CREATININE 0.55 0.72 0.71 0.58  --  0.88  CALCIUM 8.7 8.7* 8.3* 8.1*  --  8.3*  MG  --   --   --   --  1.6*  --   AST 15  --   --   --   --   --   ALT 9  --   --   --   --   --   ALKPHOS  62  --   --   --   --   --   BILITOT 1.7*  --   --   --   --   --    ------------------------------------------------------------------------------------------------------------------ estimated creatinine clearance is 69.8 mL/min (by C-G formula based on Cr of 0.88). ------------------------------------------------------------------------------------------------------------------ No results for input(s): HGBA1C in the last 72 hours. ------------------------------------------------------------------------------------------------------------------ No results for input(s): CHOL, HDL, LDLCALC, TRIG, CHOLHDL, LDLDIRECT in the last 72 hours. ------------------------------------------------------------------------------------------------------------------ No results for input(s): TSH, T4TOTAL, T3FREE, THYROIDAB in the last 72 hours.  Invalid input(s): FREET3 ------------------------------------------------------------------------------------------------------------------ No results for input(s): VITAMINB12, FOLATE, FERRITIN, TIBC, IRON, RETICCTPCT in the last 72 hours.  Coagulation profile  Recent Labs Lab 05/06/15 1504 05/06/15 1932 05/09/15 1054  INR 2.8 2.53* 1.55*    No results for input(s): DDIMER in the last 72 hours.  Cardiac Enzymes No results for input(s): CKMB, TROPONINI, MYOGLOBIN in the last 168 hours.  Invalid input(s): CK ------------------------------------------------------------------------------------------------------------------ Invalid input(s): POCBNP    Eudell Mcphee D.O. on 05/09/2015 at 12:48 PM  Between 7am to 7pm - Pager - [phone]  After 7pm go to [email or web address] - password [email or web address]  And look for the night coverage person covering for me after hours  [facility] Office  [phone]

## 2015-05-10 DIAGNOSIS — R05 Cough: Secondary | ICD-10-CM

## 2015-05-10 LAB — CBC
HCT: 33.8 % — ABNORMAL LOW (ref 36.0–46.0)
Hemoglobin: 9.1 g/dL — ABNORMAL LOW (ref 12.0–15.0)
MCH: 20.2 pg — ABNORMAL LOW (ref 26.0–34.0)
MCHC: 26.9 g/dL — ABNORMAL LOW (ref 30.0–36.0)
MCV: 75.1 fL — AB (ref 78.0–100.0)
PLATELETS: 394 10*3/uL (ref 150–400)
RBC: 4.5 MIL/uL (ref 3.87–5.11)
RDW: 26.4 % — AB (ref 11.5–15.5)
WBC: 12.1 10*3/uL — AB (ref 4.0–10.5)

## 2015-05-10 LAB — GLUCOSE, CAPILLARY
GLUCOSE-CAPILLARY: 133 mg/dL — AB (ref 65–99)
GLUCOSE-CAPILLARY: 117 mg/dL — AB (ref 65–99)
GLUCOSE-CAPILLARY: 149 mg/dL — AB (ref 65–99)
GLUCOSE-CAPILLARY: 119 mg/dL — AB (ref 65–99)
Glucose-Capillary: 114 mg/dL — ABNORMAL HIGH (ref 65–99)
Glucose-Capillary: 132 mg/dL — ABNORMAL HIGH (ref 65–99)

## 2015-05-10 LAB — PROTIME-INR
INR: 1.33 (ref 0.00–1.49)
PROTHROMBIN TIME: 16.6 s — AB (ref 11.6–15.2)

## 2015-05-10 MED ORDER — LEVOFLOXACIN 750 MG PO TABS
750.0000 mg | ORAL_TABLET | Freq: Every day | ORAL | Status: DC
  Administered 2015-05-10 – 2015-05-11 (×2): 750 mg via ORAL
  Filled 2015-05-10 (×2): qty 1

## 2015-05-10 MED ORDER — LORAZEPAM 0.5 MG PO TABS
0.5000 mg | ORAL_TABLET | Freq: Once | ORAL | Status: AC
  Administered 2015-05-10: 0.5 mg via ORAL
  Filled 2015-05-10: qty 1

## 2015-05-10 MED ORDER — FUROSEMIDE 10 MG/ML IJ SOLN
20.0000 mg | Freq: Once | INTRAMUSCULAR | Status: AC
  Administered 2015-05-10: 20 mg via INTRAVENOUS
  Filled 2015-05-10 (×2): qty 2

## 2015-05-10 MED ORDER — WARFARIN SODIUM 6 MG PO TABS
6.0000 mg | ORAL_TABLET | Freq: Once | ORAL | Status: AC
Start: 2015-05-10 — End: 2015-05-10
  Administered 2015-05-10: 6 mg via ORAL
  Filled 2015-05-10 (×2): qty 1

## 2015-05-10 NOTE — Progress Notes (Signed)
Pt transferred from [facility] this pm. Alert, oriented and able to voice needs. No concerns expressed at this time

## 2015-05-10 NOTE — Progress Notes (Addendum)
ANTICOAGULATION CONSULT NOTE - Follow-up Pharmacy Consult for Coumadin Indication: Hx DVT/PE  Allergies  Allergen Reactions  . Sulfa Antibiotics Hives    Patient Measurements: Height: 5\' 2"  (157.5 cm) Weight: 227 lb 8.2 oz (103.2 kg) IBW/kg (Calculated) : 50.1  Vital Signs: Temp: 97.5 F (36.4 C) (12/25 0634) Temp Source: Oral (12/25 0634) BP: 130/56 mmHg (12/25 0634) Pulse Rate: 74 (12/25 0634)  Labs:  Recent Labs  05/08/15 0256 05/09/15 0355 05/09/15 1054 05/10/15 0536  HGB 9.3* 9.2*  --  9.1*  HCT 32.1* 32.3*  --  33.8*  PLT 396 379  --  394  LABPROT  --   --  18.6* 16.6*  INR  --   --  1.55* 1.33  CREATININE 0.58 0.88  --   --     Estimated Creatinine Clearance: 69.8 mL/min (by C-G formula based on Cr of 0.88).   Medical History: Past Medical History  Diagnosis Date  . Thyroid nodule     no meds  . Hypertension   . Pulmonary embolism, bilateral ([place])   . DVT, bilateral lower limbs ([place])   . Shortness of breath   . Arthritis     knees  . Postmenopausal vaginal bleeding   . Rash     under breasts  . Nocturia   . Obese   . Diastolic dysfunction     grade 1  . Aortic regurgitation     mild  . Mitral valve annular calcification     mild  . Hypokinesis     EF 51%    Assessment: 73 YOF admitted 05/06/2015  with GI bleed and Hgb of 6. Pt with PMH of DVT/PE in 2014 on coumadin PTA. PTA dose: 3mg  MTWFS and 4.5mg  on SunTh. Pharmacy consulted to re-start coumadin. Will not plan to bridge at this time.   INR 3.1 on admit  S/p vit K 2.5 on 12/21, 10 mg on 12/22  INR down to 1.33 today. Hgb 9.1, PLT wnl, no bleeding noted.   Goal of Therapy:  INR 2-3 Monitor platelets by anticoagulation protocol: Yes   Plan:  Coumadin 6mg  x1 tonight  Daily INR/CBC  Monitor for s/s of bleeding  Plan to D/C once INR >2  Tevis Dunavan C. Lennox Grumbles, PharmD Pharmacy Resident  Pager: [phone] 05/10/2015 8:42 AM

## 2015-05-10 NOTE — Progress Notes (Signed)
[facility] PROGRESS NOTE Subjective Pt w/o medical complaints, c/o the fact the nurses has a "xmas party all night"  Objective: Vital signs in last 24 hours: Temp:  [97.4 F (36.3 C)-97.9 F (36.6 C)] 97.5 F (36.4 C) (12/25 0634) Pulse Rate:  [70-78] 74 (12/25 0634) Resp:  [18-22] 18 (12/25 0634) BP: (130-145)/(56-81) 130/56 mmHg (12/25 0634) SpO2:  [85 %-100 %] 95 % (12/25 0634) Last BM Date: 05/08/15  Intake/Output from previous day:   Intake/Output this shift:    PE: General--obese WF NAD  Abdomen--SNT  Lab Results:  Recent Labs  05/07/15 0744 05/08/15 0256 05/09/15 0355 05/10/15 0536  WBC 9.0 10.9* 9.0 12.1*  HGB 7.6* 9.3* 9.2* 9.1*  HCT 28.5* 32.1* 32.3* 33.8*  PLT 490* 396 379 394   BMET  Recent Labs  05/08/15 0256 05/09/15 0355  NA 140 140  K 3.3* 3.6  CL 108 106  CO2 24 28  CREATININE 0.58 0.88   LFT No results for input(s): PROT, AST, ALT, ALKPHOS, BILITOT, BILIDIR, IBILI in the last 72 hours. PT/INR  Recent Labs  05/09/15 1054 05/10/15 0536  LABPROT 18.6* 16.6*  INR 1.55* 1.33   PANCREAS No results for input(s): LIPASE in the last 72 hours.       Studies/Results: Dg Chest Port 1 View  05/09/2015  CLINICAL DATA:  69 year old female with cough EXAM: PORTABLE CHEST 1 VIEW COMPARISON:  Prior chest x-ray 05/06/2015 FINDINGS: Similar to slightly increased cardiomegaly. Pulmonary vascular congestion without overt edema. No focal scratch then no pleural effusion, focal airspace consolidation or pneumothorax. Linear bibasilar opacities favored to reflect subsegmental atelectasis. No acute osseous abnormality. IMPRESSION: 1. Linear bibasilar opacities favored to reflect subsegmental atelectasis. Small volume infiltrate would be difficult to exclude radiographically. 2. Slightly enlarged appearance of the cardiac silhouette is likely technique related (portable frontal view). The degree of cardiomegaly is likely unchanged. 3. Stable  mild vascular congestion without edema. Electronically Signed   By: Jacqulynn Cadet M.D.   On: 05/09/2015 10:58    Medications: I have reviewed the patient's current medications.  Assessment/Plan: 1. GI Bleed/anemia in anticoagulated pt. EGD and colon negative, Hg stable Should be ok to resume coumadin and discharge when stable.   Yanai Hobson JR,Elania Crowl L 05/10/2015, 7:41 AM  Pager: [phone] If no answer or after hours call [phone]

## 2015-05-10 NOTE — Progress Notes (Signed)
05/10/15 Patient being tr4ansferred to [place] room 20, report called to nurse and meds upto date.

## 2015-05-10 NOTE — Progress Notes (Signed)
Pt sitting calmy in her room. Very pleasant and jovial. No concerns voiced

## 2015-05-10 NOTE — Progress Notes (Signed)
[facility]                                                                              Patient Demographics  Nicole Gibbs, is a 69 y.o. female, DOB - Jan 13, 1946, [id number]  Admit date - 05/06/2015   Admitting Physician Eduard Clos, MD  Outpatient Primary MD for the patient is Jeani Hawking, MD  LOS - 4   Chief Complaint  Patient presents with  . Rectal Bleeding  . Cough      HPI on 05/06/2015 by Dr. Midge Minium Nicole Gibbs is a 69 y.o. female with history of PE on Coumadin, hypertension closely on no medications had gone to the urgent care center because of shortness of breath. Lab work showed patient had hemoglobin of 6 which was a drop of 6 g from previous. Stool for blood showed melanotic stool. Patient was referred to the ER. Patient states over the last 4 days patient has noticed black tarry stools. Denies any abdominal pain or chest pain. Denies using any NSAIDs. Patient's INR is therapeutic. At this time patient has been admitted for acute GI bleed most likely upper GI. Patient is hemodynamically stable. Denies any hematemesis. Patient states she has not had any previous episodes of GI bleed and has not had any endoscopies.   Assessment & Plan   Suspected GI Bleed -Patient does have history of DVT and PE she is anticoagulated with Coumadin, her hemoglobin was 6.2 upon admission with INR of 2.8 (she was given vitamin K for reversal) -[facility] consultation appreciated, patient had upper endoscopy 05/07/2015 which did not reveal obvious source of bleeding from the upper GI tract. -Colonoscopy 05/08/2015: No obvious source of bleeding seen, anticoagulation can be resumed, recommended routine colonoscopy screening repeat procedure in 10 years with occult testing in 4-5 years -Gastroenterology recommended continuing anticoagulation -Continue Protonix 40 mg twice daily  Acute blood loss anemia -Secondary to the above, hemoglobin upon admission was  6.2 -she received 4 units PRBC, hemoglobin currently 9.1 -Continue to monitor CBC  History of pulmonary embolism/DVT -Occurred in 2014, patient is on Coumadin -Continue coumadin per pharmacy, INR 1.33  Essential hypertension -Antihypertensive medications were initially held due to GI bleed concerns of hypotension -Continue metoprolol  Chronic diastolic heart failure -Echocardiogram 2014 showed an EF of 55-60%, grade 1 diastolic dysfunction -Chronic evidence of volume overload -Will give small dose of lasix  Cough -Patient states she has had a cough for several months -Obtained chest x-ray, linear bibasilar opacities reflecting segmental atelectasis -Spiked small WBC, 12.1, will start levaquin  Hypoxia with ambulation -Patient's oxygen sats dropped to 79-80% on room air with ambulation -Will plan on discharging patient with home O2  Code Status: Full  Family Communication: None at bedside  Disposition Plan: Admitted. Will restart coumadin and monitor H/H.  Time Spent in minutes   30 minutes  Procedures  EGD Colonoscopy  Consults   Gastroenterology  DVT Prophylaxis  Warfarin  Lab Results  Component Value Date   PLT 394 05/10/2015    Medications  Scheduled Meds: . antiseptic oral rinse  7 mL Mouth Rinse q12n4p  . chlorhexidine  15 mL Mouth Rinse BID  . furosemide  20 mg Intravenous Once  . levofloxacin  750 mg Oral Daily  . metoprolol tartrate  50 mg Oral BID  . pantoprazole  40 mg Oral BID  . warfarin  6 mg Oral ONCE-1800  . Warfarin - Pharmacist Dosing Inpatient   Does not apply q1800   Continuous Infusions:  PRN Meds:.acetaminophen **OR** acetaminophen, nitroGLYCERIN, ondansetron **OR** ondansetron (ZOFRAN) IV  Antibiotics    Anti-infectives    Start     Dose/Rate Route Frequency Ordered Stop   05/10/15 1300  levofloxacin (LEVAQUIN) tablet 750 mg     750 mg Oral Daily 05/10/15 1257       Subjective:   Nicole Gibbs seen and examined today.   Patient continues to complain of cough, non productive and states it has been ongoing for several months.  Denies chest pain, shortness of breath, abdominal pain, N/V/D/C.    Objective:   Filed Vitals:   05/09/15 1011 05/09/15 1506 05/09/15 2131 05/10/15 0634  BP:  132/67 142/81 130/56  Pulse: 72 70 78 74  Temp:  97.4 F (36.3 C) 97.9 F (36.6 C) 97.5 F (36.4 C)  TempSrc:  Oral Oral Oral  Resp:  22 18 18   Height:      Weight:      SpO2: 85% 98% 100% 95%    Wt Readings from Last 3 Encounters:  05/08/15 103.2 kg (227 lb 8.2 oz)  05/06/15 100.154 kg (220 lb 12.8 oz)  11/04/14 107.185 kg (236 lb 4.8 oz)     Intake/Output Summary (Last 24 hours) at 05/10/15 1258 Last data filed at 05/10/15 0900  Gross per 24 hour  Intake    222 ml  Output      0 ml  Net    222 ml    Exam  General: Well developed, well nourished, NAD, appears stated age  HEENT: NCAT,mucous membranes moist.   Cardiovascular: S1 S2 auscultated, RRR  Respiratory: Diminished but clear  Abdomen: Soft, nontender, nondistended, + bowel sounds  Extremities: warm dry without cyanosis clubbing or edema  Neuro: AAOx3, nonfocal  Psych: Normal affect and demeanor   Data Review   Micro Results Recent Results (from the past 240 hour(s))  MRSA PCR Screening     Status: None   Collection Time: 05/06/15 11:00 PM  Result Value Ref Range Status   MRSA by PCR NEGATIVE NEGATIVE Final    Comment:        The GeneXpert MRSA Assay (FDA approved for NASAL specimens only), is one component of a comprehensive MRSA colonization surveillance program. It is not intended to diagnose MRSA infection nor to guide or monitor treatment for MRSA infections.     Radiology Reports Dg Chest 2 View  05/06/2015  CLINICAL DATA:  Vomiting, shortness of breath, cough EXAM: CHEST  2 VIEW COMPARISON:  03/27/2013, 08/03/2012 FINDINGS: Stable cardiomegaly with slight vascular congestion and chronic basilar atelectasis versus  scarring. No acute CHF, edema pattern or pneumonia. No collapse or consolidation. No significant effusion or pneumothorax. Degenerative changes of the spine. No acute osseous finding. IMPRESSION: Stable cardiomegaly with slight vascular congestion and basilar atelectasis versus scarring. No interval change or superimposed acute process. Electronically Signed   By: Judie Petit.  Shick M.D.   On: 05/06/2015 17:43   Dg Chest Port 1 View  05/09/2015  CLINICAL DATA:  69 year old female with cough EXAM: PORTABLE CHEST 1 VIEW COMPARISON:  Prior chest x-ray 05/06/2015 FINDINGS: Similar to slightly increased cardiomegaly. Pulmonary vascular congestion without overt edema. No focal scratch then  no pleural effusion, focal airspace consolidation or pneumothorax. Linear bibasilar opacities favored to reflect subsegmental atelectasis. No acute osseous abnormality. IMPRESSION: 1. Linear bibasilar opacities favored to reflect subsegmental atelectasis. Small volume infiltrate would be difficult to exclude radiographically. 2. Slightly enlarged appearance of the cardiac silhouette is likely technique related (portable frontal view). The degree of cardiomegaly is likely unchanged. 3. Stable mild vascular congestion without edema. Electronically Signed   By: Malachy Moan M.D.   On: 05/09/2015 10:58    CBC  Recent Labs Lab 05/06/15 1932 05/07/15 0302 05/07/15 0744 05/08/15 0256 05/09/15 0355 05/10/15 0536  WBC 8.0 8.5 9.0 10.9* 9.0 12.1*  HGB 6.2* 7.5* 7.6* 9.3* 9.2* 9.1*  HCT 24.7* 27.7* 28.5* 32.1* 32.3* 33.8*  PLT 569* 485* 490* 396 379 394  MCV 63.8* 69.1* 69.5* 72.5* 74.1* 75.1*  MCH 16.0* 18.7* 18.5* 21.0* 21.1* 20.2*  MCHC 25.1* 27.1* 26.7* 29.0* 28.5* 26.9*  RDW 18.7* 23.1* 22.9* 23.9* 25.3* 26.4*  LYMPHSABS 1.4  --   --   --   --   --   MONOABS 0.9  --   --   --   --   --   EOSABS 0.1  --   --   --   --   --   BASOSABS 0.1  --   --   --   --   --     Chemistries   Recent Labs Lab 05/06/15 1712  05/06/15 1932 05/07/15 0302 05/08/15 0256 05/08/15 0500 05/09/15 0355  NA 137 142 139 140  --  140  K 4.2 3.6 3.9 3.3*  --  3.6  CL 102 108 108 108  --  106  CO2 25 25 23 24   --  28  GLUCOSE 107* 120* 117* 101*  --  145*  BUN 15 14 15 13   --  16  CREATININE 0.55 0.72 0.71 0.58  --  0.88  CALCIUM 8.7 8.7* 8.3* 8.1*  --  8.3*  MG  --   --   --   --  1.6*  --   AST 15  --   --   --   --   --   ALT 9  --   --   --   --   --   ALKPHOS 62  --   --   --   --   --   BILITOT 1.7*  --   --   --   --   --    ------------------------------------------------------------------------------------------------------------------ estimated creatinine clearance is 69.8 mL/min (by C-G formula based on Cr of 0.88). ------------------------------------------------------------------------------------------------------------------ No results for input(s): HGBA1C in the last 72 hours. ------------------------------------------------------------------------------------------------------------------ No results for input(s): CHOL, HDL, LDLCALC, TRIG, CHOLHDL, LDLDIRECT in the last 72 hours. ------------------------------------------------------------------------------------------------------------------ No results for input(s): TSH, T4TOTAL, T3FREE, THYROIDAB in the last 72 hours.  Invalid input(s): FREET3 ------------------------------------------------------------------------------------------------------------------ No results for input(s): VITAMINB12, FOLATE, FERRITIN, TIBC, IRON, RETICCTPCT in the last 72 hours.  Coagulation profile  Recent Labs Lab 05/06/15 1504 05/06/15 1932 05/09/15 1054 05/10/15 0536  INR 2.8 2.53* 1.55* 1.33    No results for input(s): DDIMER in the last 72 hours.  Cardiac Enzymes No results for input(s): CKMB, TROPONINI, MYOGLOBIN in the last 168 hours.  Invalid input(s):  CK ------------------------------------------------------------------------------------------------------------------ Invalid input(s): POCBNP    Zarria Towell D.O. on 05/10/2015 at 12:58 PM  Between 7am to 7pm - Pager - [phone]  After 7pm go to [email or web address] - password [email or web address]  And look for the  night coverage person covering for me after hours  [facility] Office  [phone]

## 2015-05-11 ENCOUNTER — Encounter (HOSPITAL_COMMUNITY): Payer: Self-pay | Admitting: Gastroenterology

## 2015-05-11 DIAGNOSIS — R05 Cough: Secondary | ICD-10-CM | POA: Insufficient documentation

## 2015-05-11 DIAGNOSIS — R059 Cough, unspecified: Secondary | ICD-10-CM | POA: Insufficient documentation

## 2015-05-11 LAB — BASIC METABOLIC PANEL
Anion gap: 6 (ref 5–15)
BUN: 18 mg/dL (ref 6–20)
CO2: 30 mmol/L (ref 22–32)
CREATININE: 0.62 mg/dL (ref 0.44–1.00)
Calcium: 8 mg/dL — ABNORMAL LOW (ref 8.9–10.3)
Chloride: 106 mmol/L (ref 101–111)
GFR calc Af Amer: >60 mL/min (ref >60)
GFR calc non Af Amer: >60 mL/min (ref >60)
GLUCOSE: 129 mg/dL — AB (ref 65–99)
POTASSIUM: 3.8 mmol/L (ref 3.5–5.1)
SODIUM: 142 mmol/L (ref 135–145)

## 2015-05-11 LAB — GLUCOSE, CAPILLARY
Glucose-Capillary: 122 mg/dL — ABNORMAL HIGH (ref 65–99)
Glucose-Capillary: 112 mg/dL — ABNORMAL HIGH (ref 65–99)

## 2015-05-11 LAB — CBC
HEMATOCRIT: 34.2 % — AB (ref 36.0–46.0)
Hemoglobin: 9.2 g/dL — ABNORMAL LOW (ref 12.0–15.0)
MCH: 20.3 pg — ABNORMAL LOW (ref 26.0–34.0)
MCHC: 26.9 g/dL — AB (ref 30.0–36.0)
MCV: 75.3 fL — ABNORMAL LOW (ref 78.0–100.0)
Platelets: 378 10*3/uL (ref 150–400)
RBC: 4.54 MIL/uL (ref 3.87–5.11)
RDW: 27.3 % — AB (ref 11.5–15.5)
WBC: 12.9 10*3/uL — AB (ref 4.0–10.5)

## 2015-05-11 LAB — PROTIME-INR
INR: 1.52 — AB (ref 0.00–1.49)
PROTHROMBIN TIME: 18.3 s — AB (ref 11.6–15.2)

## 2015-05-11 MED ORDER — LEVOFLOXACIN 750 MG PO TABS: 750.0000 mg | ORAL_TABLET | Freq: Every day | ORAL | Status: DC

## 2015-05-11 MED ORDER — BENZONATATE 200 MG PO CAPS: 200.0000 mg | ORAL_CAPSULE | Freq: Three times a day (TID) | ORAL | Status: DC | PRN

## 2015-05-11 MED ORDER — PANTOPRAZOLE SODIUM 40 MG PO TBEC: 40.0000 mg | DELAYED_RELEASE_TABLET | Freq: Two times a day (BID) | ORAL | Status: DC

## 2015-05-11 NOTE — Progress Notes (Signed)
SATURATION QUALIFICATIONS: (This note is used to comply with regulatory documentation for home oxygen)  Patient Saturations on Room Air at Rest = 94%  Patient Saturations on Room Air while Ambulating =lowest 84% highest 96%  Patient Saturations on 2Liters of oxygen while Ambulating =lowest 88% highest 100%  Please briefly explain why patient needs home oxygen:

## 2015-05-11 NOTE — Care Management Note (Addendum)
Case Management Note  Patient Details  Name: Nicole Gibbs MRN: [id number] Date of Birth: [birth year]  Subjective/Objective:                    Action/Plan:  Patient 's husband has services through [facility] , patient would like HHPT through [facility] also , referral given to and accepted by Sharmon Revere phone [phone], fax [phone].   Patient needing PCP explained she can call number on insurance card to list of MD 's in network , or call MD office directly to see if they are accepting new patient's with her insurance. Patient stated she will call her husband's MD to see if she can be followed also . However , MD offices closed today due to holiday.  Same explained to [facility] , who stated patient's current MD , DR Runell Gess who is a GYN usually signs home health PT orders.  Per sat note , patient does not require home oxygen.  Expected Discharge Date:                  Expected Discharge Plan:  [place]  In-House Referral:     Discharge planning Services  CM Consult  Post Acute Care Choice:  Home Health Choice offered to:  Patient  DME Arranged:    DME Agency:     HH Arranged:  PT HH Agency:  [facility]  Status of Service:  Completed, signed off  Medicare Important Message Given:    Date Medicare IM Given:    Medicare IM give by:    Date Additional Medicare IM Given:    Additional Medicare Important Message give by:     If discussed at [place] of Stay Meetings, dates discussed:    Additional Comments:  Marilu Favre, RN 05/11/2015, 11:10 AM

## 2015-05-11 NOTE — Discharge Instructions (Signed)
Anemia, Nonspecific Anemia is a condition in which the concentration of red blood cells or hemoglobin in the blood is below normal. Hemoglobin is a substance in red blood cells that carries oxygen to the tissues of the body. Anemia results in not enough oxygen reaching these tissues.  CAUSES  Common causes of anemia include:   Excessive bleeding. Bleeding may be internal or external. This includes excessive bleeding from periods (in women) or from the intestine.   Poor nutrition.   Chronic kidney, thyroid, and liver disease.  Bone marrow disorders that decrease red blood cell production.  Cancer and treatments for cancer.  HIV, AIDS, and their treatments.  Spleen problems that increase red blood cell destruction.  Blood disorders.  Excess destruction of red blood cells due to infection, medicines, and autoimmune disorders. SIGNS AND SYMPTOMS   Minor weakness.   Dizziness.   Headache.  Palpitations.   Shortness of breath, especially with exercise.   Paleness.  Cold sensitivity.  Indigestion.  Nausea.  Difficulty sleeping.  Difficulty concentrating. Symptoms may occur suddenly or they may develop slowly.  DIAGNOSIS  Additional blood tests are often needed. These help your health care provider determine the best treatment. Your health care provider will check your stool for blood and look for other causes of blood loss.  TREATMENT  Treatment varies depending on the cause of the anemia. Treatment can include:   Supplements of iron, vitamin B12, or folic acid.   Hormone medicines.   A blood transfusion. This may be needed if blood loss is severe.   Hospitalization. This may be needed if there is significant continual blood loss.   Dietary changes.  Spleen removal. HOME CARE INSTRUCTIONS Keep all follow-up appointments. It often takes many weeks to correct anemia, and having your health care provider check on your condition and your response to  treatment is very important. SEEK IMMEDIATE MEDICAL CARE IF:   You develop extreme weakness, shortness of breath, or chest pain.   You become dizzy or have trouble concentrating.  You develop heavy vaginal bleeding.   You develop a rash.   You have bloody or black, tarry stools.   You faint.   You vomit up blood.   You vomit repeatedly.   You have abdominal pain.  You have a fever or persistent symptoms for more than 2-3 days.   You have a fever and your symptoms suddenly get worse.   You are dehydrated.  MAKE SURE YOU:  Understand these instructions.  Will watch your condition.  Will get help right away if you are not doing well or get worse.   This information is not intended to replace advice given to you by your health care provider. Make sure you discuss any questions you have with your health care provider.   Document Released: 06/09/2004 Document Revised: 01/02/2013 Document Reviewed: 10/26/2012 Elsevier Interactive Patient Education 2016 [employer]  

## 2015-05-11 NOTE — Discharge Summary (Signed)
Physician Discharge Summary  Nicole Gibbs DOB: 06-01-1945 DOA: 05/06/2015  PCP: [name], MD  Admit date: 05/06/2015 Discharge date: 05/11/2015  Time spent: 45 minutes  Recommendations for Outpatient Follow-up:  Patient will be discharged to home with home health PT and oxygen.  Patient will need to follow up with primary care provider within one week of discharge, repeat CBC.  Follow up with cardiology for INR check.  Patient should continue medications as prescribed.  Patient should follow a heart healthy diet.   Discharge Diagnoses:  Suspected GI bleed Acute blood loss anemia History pulmonary embolism/DVT Central hypertension Chronic diastolic heart failure Cough  Hypoxia with ambulation Physical deconditioning  Discharge Condition: Stable  Diet recommendation: Heart healthy  Filed Weights   05/06/15 1917 05/06/15 2259 05/08/15 1606  Weight: 99.791 kg (220 lb) 99.9 kg (220 lb 3.8 oz) 103.2 kg (227 lb 8.2 oz)    History of present illness:  on 05/06/2015 by Dr. Gean Birchwood Nicole Gibbs is a 69 y.o. female with history of PE on Coumadin, hypertension closely on no medications had gone to the urgent care center because of shortness of breath. Lab work showed patient had hemoglobin of 6 which was a drop of 6 g from previous. Stool for blood showed melanotic stool. Patient was referred to the ER. Patient states over the last 4 days patient has noticed black tarry stools. Denies any abdominal pain or chest pain. Denies using any NSAIDs. Patient's INR is therapeutic. At this time patient has been admitted for acute GI bleed most likely upper GI. Patient is hemodynamically stable. Denies any hematemesis. Patient states she has not had any previous episodes of GI bleed and has not had any endoscopies.   Hospital Course:  Suspected GI Bleed -Patient does have history of DVT and PE she is anticoagulated with Coumadin, her hemoglobin was 6.2 upon admission  with INR of 2.8 (she was given vitamin K for reversal) -[facility] consultation appreciated, patient had upper endoscopy 05/07/2015 which did not reveal obvious source of bleeding from the upper GI tract. -Colonoscopy 05/08/2015: No obvious source of bleeding seen, anticoagulation can be resumed, recommended routine colonoscopy screening repeat procedure in 10 years with occult testing in 4-5 years -Gastroenterology recommended continuing anticoagulation -Continue Protonix 40 mg twice daily  Acute blood loss anemia -Secondary to the above, hemoglobin upon admission was 6.2 -she received 4 units PRBC, hemoglobin currently 9.2  History of pulmonary embolism/DVT -Occurred in 2014, patient is on Coumadin -Continue coumadin per pharmacy, INR 1.33  Essential hypertension -Antihypertensive medications were initially held due to GI bleed concerns of hypotension -Continue metoprolol  Chronic diastolic heart failure -Echocardiogram 2014 showed an EF of 0000000, grade 1 diastolic dysfunction -Chronic evidence of volume overload  Cough -Patient states she has had a cough for several months -Obtained chest x-ray, linear bibasilar opacities reflecting segmental atelectasis -Spiked small WBC, 12.1, will start levaquin  Hypoxia with ambulation -Patient's oxygen sats dropped to 79-80% on room air with ambulation -Possibly still related to symptomatic anemia -Will discharge patient with home oxygen  Physical deconditioning -PT recommended home health  Procedures  EGD Colonoscopy  Consults  Gastroenterology  Discharge Exam: Filed Vitals:   05/10/15 2121 05/11/15 0532  BP: 153/76 125/70  Pulse: 83   Temp: 97.5 F (36.4 C) 98 F (36.7 C)  Resp: 20 22   Exam  General: Well developed, well nourished, NAD, appears stated age  HEENT: NCAT,mucous membranes moist.   Cardiovascular: S1 S2 auscultated, RRR  Respiratory: Diminished but clear  Abdomen: Soft, nontender, nondistended, +  bowel sounds  Extremities: warm dry without cyanosis clubbing or edema  Neuro: AAOx3, nonfocal  Psych: Normal affect and demeanor, pleaseant  Discharge Instructions      Discharge Instructions    Discharge instructions    Complete by:  As directed   Patient will be discharged to home with home health PT and oxygen.  Patient will need to follow up with primary care provider within one week of discharge, repeat CBC.  Follow up with cardiology for INR check.  Patient should continue medications as prescribed.  Patient should follow a heart healthy diet.            Medication List    STOP taking these medications        ibuprofen 200 MG tablet  Commonly known as:  ADVIL,MOTRIN     lisinopril 10 MG tablet  Commonly known as:  PRINIVIL,ZESTRIL      TAKE these medications        benzonatate 200 MG capsule  Commonly known as:  TESSALON  Take 1 capsule (200 mg total) by mouth 3 (three) times daily as needed for cough.     levofloxacin 750 MG tablet  Commonly known as:  LEVAQUIN  Take 1 tablet (750 mg total) by mouth daily.     metoprolol 50 MG tablet  Commonly known as:  LOPRESSOR  Take 2 tablets in the morning and 1 tablet in the evening.     nitroGLYCERIN 0.4 MG SL tablet  Commonly known as:  NITROSTAT  Place 1 tablet (0.4 mg total) under the tongue every 5 (five) minutes as needed for chest pain.     nystatin powder  Commonly known as:  MYCOSTATIN  Apply topically 4 (four) times daily.     pantoprazole 40 MG tablet  Commonly known as:  PROTONIX  Take 1 tablet (40 mg total) by mouth 2 (two) times daily.     warfarin 3 MG tablet  Commonly known as:  COUMADIN  TAKE 1 TO 1 AND 1/2 TABLETS BY MOUTH DAILY OR AS DIRECTED       Allergies  Allergen Reactions  . Sulfa Antibiotics Hives   Follow-up Information    Follow up with GREWAL,MICHELLE L, MD. Schedule an appointment as soon as possible for a visit in 1 week.   Specialty:  Obstetrics and Gynecology   Why:   Hospital follow up   Contact information:   [place] [address] [phone]       Follow up with Quay Burow, MD. Schedule an appointment as soon as possible for a visit in 1 week.   Specialties:  Cardiology, Radiology   Why:  INR check   Contact information:   [address] [place] [address] [phone]       Schedule an appointment as soon as possible for a visit with EDWARDS Dahlia Client, MD.   Specialty:  Gastroenterology   Why:  As needed   Contact information:   [address] [place] [address] [phone]        The results of significant diagnostics from this hospitalization (including imaging, microbiology, ancillary and laboratory) are listed below for reference.    Significant Diagnostic Studies: Dg Chest 2 View  05/06/2015  CLINICAL DATA:  Vomiting, shortness of breath, cough EXAM: CHEST  2 VIEW COMPARISON:  03/27/2013, 08/03/2012 FINDINGS: Stable cardiomegaly with slight vascular congestion and chronic  basilar atelectasis versus scarring. No acute CHF, edema pattern or pneumonia. No collapse or consolidation. No significant effusion or pneumothorax. Degenerative changes of the spine. No acute osseous finding. IMPRESSION: Stable cardiomegaly with slight vascular congestion and basilar atelectasis versus scarring. No interval change or superimposed acute process. Electronically Signed   By: Jerilynn Mages.  Shick M.D.   On: 05/06/2015 17:43   Dg Chest Port 1 View  05/09/2015  CLINICAL DATA:  69 year old female with cough EXAM: PORTABLE CHEST 1 VIEW COMPARISON:  Prior chest x-ray 05/06/2015 FINDINGS: Similar to slightly increased cardiomegaly. Pulmonary vascular congestion without overt edema. No focal scratch then no pleural effusion, focal airspace consolidation or pneumothorax. Linear bibasilar opacities favored to reflect subsegmental atelectasis. No acute osseous abnormality. IMPRESSION: 1. Linear bibasilar  opacities favored to reflect subsegmental atelectasis. Small volume infiltrate would be difficult to exclude radiographically. 2. Slightly enlarged appearance of the cardiac silhouette is likely technique related (portable frontal view). The degree of cardiomegaly is likely unchanged. 3. Stable mild vascular congestion without edema. Electronically Signed   By: Jacqulynn Cadet M.D.   On: 05/09/2015 10:58    Microbiology: Recent Results (from the past 240 hour(s))  MRSA PCR Screening     Status: None   Collection Time: 05/06/15 11:00 PM  Result Value Ref Range Status   MRSA by PCR NEGATIVE NEGATIVE Final    Comment:        The GeneXpert MRSA Assay (FDA approved for NASAL specimens only), is one component of a comprehensive MRSA colonization surveillance program. It is not intended to diagnose MRSA infection nor to guide or monitor treatment for MRSA infections.      Labs: Basic Metabolic Panel:  Recent Labs Lab 05/06/15 1932 05/07/15 0302 05/08/15 0256 05/08/15 0500 05/09/15 0355 05/11/15 0325  NA 142 139 140  --  140 142  K 3.6 3.9 3.3*  --  3.6 3.8  CL 108 108 108  --  106 106  CO2 25 23 24   --  28 30  GLUCOSE 120* 117* 101*  --  145* 129*  BUN 14 15 13   --  16 18  CREATININE 0.72 0.71 0.58  --  0.88 0.62  CALCIUM 8.7* 8.3* 8.1*  --  8.3* 8.0*  MG  --   --   --  1.6*  --   --    Liver Function Tests:  Recent Labs Lab 05/06/15 1712  AST 15  ALT 9  ALKPHOS 62  BILITOT 1.7*  PROT 6.3  ALBUMIN 3.7   No results for input(s): LIPASE, AMYLASE in the last 168 hours. No results for input(s): AMMONIA in the last 168 hours. CBC:  Recent Labs Lab 05/06/15 1932  05/07/15 0744 05/08/15 0256 05/09/15 0355 05/10/15 0536 05/11/15 0325  WBC 8.0  < > 9.0 10.9* 9.0 12.1* 12.9*  NEUTROABS 5.5  --   --   --   --   --   --   HGB 6.2*  < > 7.6* 9.3* 9.2* 9.1* 9.2*  HCT 24.7*  < > 28.5* 32.1* 32.3* 33.8* 34.2*  MCV 63.8*  < > 69.5* 72.5* 74.1* 75.1* 75.3*  PLT 569*   < > 490* 396 379 394 378  < > = values in this interval not displayed. Cardiac Enzymes: No results for input(s): CKTOTAL, CKMB, CKMBINDEX, TROPONINI in the last 168 hours. BNP: BNP (last 3 results) No results for input(s): BNP in the last 8760 hours.  ProBNP (last 3 results) No results for input(s): PROBNP in  the last 8760 hours.  CBG:  Recent Labs Lab 05/10/15 1631 05/10/15 1929 05/10/15 2339 05/11/15 0319 05/11/15 0754  GLUCAP 149* 119* 114* 122* 112*       Signed:  Cristal Ford  [facility] 05/11/2015, 10:36 AM

## 2015-05-11 NOTE — Progress Notes (Signed)
Pt scheduled for discharge home this morning. Condition stable. Discharge education provided

## 2015-05-13 ENCOUNTER — Ambulatory Visit (INDEPENDENT_AMBULATORY_CARE_PROVIDER_SITE_OTHER): Payer: Medicare Other

## 2015-05-13 ENCOUNTER — Ambulatory Visit (INDEPENDENT_AMBULATORY_CARE_PROVIDER_SITE_OTHER): Payer: Medicare Other | Admitting: Emergency Medicine

## 2015-05-13 ENCOUNTER — Encounter: Payer: Self-pay | Admitting: *Deleted

## 2015-05-13 VITALS — BP 152/98 | HR 107 | Temp 97.4°F | Resp 18 | Ht 62.0 in | Wt 227.0 lb

## 2015-05-13 DIAGNOSIS — M25532 Pain in left wrist: Secondary | ICD-10-CM | POA: Diagnosis not present

## 2015-05-13 DIAGNOSIS — S52202A Unspecified fracture of shaft of left ulna, initial encounter for closed fracture: Secondary | ICD-10-CM

## 2015-05-13 DIAGNOSIS — S5292XA Unspecified fracture of left forearm, initial encounter for closed fracture: Secondary | ICD-10-CM

## 2015-05-13 MED ORDER — HYDROCODONE-ACETAMINOPHEN 5-325 MG PO TABS: 1.0000 | ORAL_TABLET | ORAL | Status: DC | PRN

## 2015-05-13 NOTE — Patient Instructions (Signed)
Wrist Fracture °A wrist fracture is a break or crack in one of the bones of your wrist. Your wrist is made up of eight small bones at the palm of your hand (carpal bones) and two long bones that make up your forearm (radius and ulna). °CAUSES °· A direct blow to the wrist. °· Falling on an outstretched hand. °· Trauma, such as a car accident or a fall. °RISK FACTORS °Risk factors for wrist fracture include: °· Participating in contact and high-risk sports, such as skiing, biking, and ice skating. °· Taking steroid medicines. °· Smoking. °· Being female. °· Being Caucasian. °· Drinking more than three alcoholic beverages per day. °· Having low or lowered bone density (osteoporosis or osteopenia). °· Age. Older adults have decreased bone density. °· Women who have had menopause. °· History of previous fractures. °SIGNS AND SYMPTOMS °Symptoms of wrist fractures include tenderness, bruising, and inflammation. Additionally, the wrist may hang in an odd position or appear deformed. °DIAGNOSIS °Diagnosis may include: °· Physical exam. °· X-ray. °TREATMENT °Treatment depends on many factors, including the nature and location of the fracture, your age, and your activity level. Treatment for wrist fracture can be nonsurgical or surgical. °Nonsurgical Treatment °A plaster cast or splint may be applied to your wrist if the bone is in a good position. If the fracture is not in good position, it may be necessary for your health care provider to realign it before applying a splint or cast. Usually, a cast or splint will be worn for several weeks. °Surgical Treatment °Sometimes the position of the bone is so far out of place that surgery is required to apply a device to hold it together as it heals. Depending on the fracture, there are a number of options for holding the bone in place while it heals, such as a cast and metal pins. °HOME CARE INSTRUCTIONS °· Keep your injured wrist elevated and move your fingers as much as  possible. °· Do not put pressure on any part of your cast or splint. It may break. °· Use a plastic bag to protect your cast or splint from water while bathing or showering. Do not lower your cast or splint into water. °· Take medicines only as directed by your health care provider. °· Keep your cast or splint clean and dry. If it becomes wet, damaged, or suddenly feels too tight, contact your health care provider right away. °· Do not use any tobacco products including cigarettes, chewing tobacco, or electronic cigarettes. Tobacco can delay bone healing. If you need help quitting, ask your health care provider. °· Keep all follow-up visits as directed by your health care provider. This is important. °· Ask your health care provider if you should take supplements of calcium and vitamins C and D to promote bone healing. °SEEK MEDICAL CARE IF: °· Your cast or splint is damaged, breaks, or gets wet. °· You have a fever. °· You have chills. °· You have continued severe pain or more swelling than you did before the cast was put on. °SEEK IMMEDIATE MEDICAL CARE IF: °· Your hand or fingernails on the injured arm turn blue or gray, or feel cold or numb. °· You have decreased feeling in the fingers of your injured arm. °MAKE SURE YOU: °· Understand these instructions. °· Will watch your condition. °· Will get help right away if you are not doing well or get worse. °  °This information is not intended to replace advice given to you by your   health care provider. Make sure you discuss any questions you have with your health care provider. °  °Document Released: 02/09/2005 Document Revised: 01/21/2015 Document Reviewed: 05/20/2011 °Elsevier Interactive Patient Education ©2016 [employer] Inc[employer] ° °

## 2015-05-13 NOTE — Progress Notes (Signed)
Subjective:  Patient ID: Nicole Gibbs, female    DOB: 29-Feb-1946  Age: 69 y.o. MRN: [id number]  CC: Fall   HPI [name] presents  the patient tripped and fell 2 days ago and landed on her outstretched wrist. She's been having pain in her left wrist since she's had difficulty using her hand and arm due to pain. She has moderate swelling of the hand and wrist. She denies any syncope or chest pain tightness heaviness pressure dizziness or other complaints. She had no history of head injury.  History Armeda has a past medical history of Thyroid nodule; Hypertension; Pulmonary embolism, bilateral ([place]); DVT, bilateral lower limbs ([place]); Shortness of breath; Arthritis; Postmenopausal vaginal bleeding; Rash; Nocturia; Obese; Diastolic dysfunction; Aortic regurgitation; Mitral valve annular calcification; and Hypokinesis.   She has past surgical history that includes Cesarean section (1992); Hysteroscopy w/D&C (03-24-2009); Dilation and curettage of uterus (06/20/2012); dilation and evacution (1988); Cardiovascular stress test (08-07-2012  DR HILTY/ DR [name]); transthoracic echocardiogram (08-08-2012); Dilatation & currettage/hysteroscopy with thermachoice ablation (N/A, 09/04/2012); Esophagogastroduodenoscopy (N/A, 05/07/2015); and Colonoscopy (N/A, 05/08/2015).   Her  family history includes Coronary artery disease (age of onset: 43) in her father; Hypertension in her mother; Mental illness in her mother; Stroke in her father; Sudden death in her sister.  She   reports that she has never smoked. She has never used smokeless tobacco. She reports that she does not drink alcohol or use illicit drugs.  Outpatient Prescriptions Prior to Visit  Medication Sig Dispense Refill  . benzonatate (TESSALON) 200 MG capsule Take 1 capsule (200 mg total) by mouth 3 (three) times daily as needed for cough. 20 capsule 0  . levofloxacin (LEVAQUIN) 750 MG tablet Take 1 tablet (750 mg total) by mouth daily. 3 tablet 0    . nitroGLYCERIN (NITROSTAT) 0.4 MG SL tablet Place 1 tablet (0.4 mg total) under the tongue every 5 (five) minutes as needed for chest pain. 25 tablet 3  . nystatin (MYCOSTATIN) powder Apply topically 4 (four) times daily. 60 g 3  . pantoprazole (PROTONIX) 40 MG tablet Take 1 tablet (40 mg total) by mouth 2 (two) times daily. 60 tablet 0  . warfarin (COUMADIN) 3 MG tablet TAKE 1 TO 1 AND 1/2 TABLETS BY MOUTH DAILY OR AS DIRECTED (Patient taking differently: Take 3 mg by mouth on Mon, Tue, Wed, Fri, and Sat. Take 4.5 mg by mouth daily on Sun and Thur.) 120 tablet 1  . metoprolol (LOPRESSOR) 50 MG tablet Take 2 tablets in the morning and 1 tablet in the evening. (Patient not taking: Reported on 05/13/2015) 270 tablet 1   No facility-administered medications prior to visit.    Social History   Social History  . Marital Status: Married    Spouse Name: N/A  . Number of Children: N/A  . Years of Education: N/A   Social History Main Topics  . Smoking status: Never Smoker   . Smokeless tobacco: Never Used  . Alcohol Use: No  . Drug Use: No  . Sexual Activity: Not Asked   Other Topics Concern  . None   Social History Narrative     Review of Systems  Constitutional: Negative for fever, chills and appetite change.  HENT: Negative for congestion, ear pain, postnasal drip, sinus pressure and sore throat.   Eyes: Negative for pain and redness.  Respiratory: Negative for cough, shortness of breath and wheezing.   Cardiovascular: Negative for leg swelling.  Gastrointestinal: Negative for nausea, vomiting, abdominal pain, diarrhea,  constipation and blood in stool.  Endocrine: Negative for polyuria.  Genitourinary: Negative for dysuria, urgency, frequency and flank pain.  Musculoskeletal: Positive for arthralgias and neck pain. Negative for gait problem.  Skin: Negative for rash.  Neurological: Negative for weakness and headaches.  Psychiatric/Behavioral: Negative for confusion and  decreased concentration. The patient is not nervous/anxious.     Objective:  BP 152/98 mmHg  Pulse 107  Temp(Src) 97.4 F (36.3 C) (Oral)  Resp 18  Ht 5\' 2"  (1.575 m)  Wt 227 lb (102.967 kg)  BMI 41.51 kg/m2  SpO2 96%  Physical Exam  Constitutional: She is oriented to person, place, and time. She appears well-developed and well-nourished.  HENT:  Head: Normocephalic and atraumatic.  Eyes: Conjunctivae are normal. Pupils are equal, round, and reactive to light.  Pulmonary/Chest: Effort normal.  Musculoskeletal: She exhibits no edema.       Left wrist: She exhibits tenderness, bony tenderness and swelling. She exhibits no deformity.  Neurological: She is alert and oriented to person, place, and time.  Skin: Skin is dry.  Psychiatric: She has a normal mood and affect. Her behavior is normal. Thought content normal.      Assessment & Plan:   Elyanah was seen today for fall.  Diagnoses and all orders for this visit:  Fracture of radius and ulna, closed, left, initial encounter -     DG Wrist Complete Left; Future -     Ambulatory referral to Orthopedic Surgery  Other orders -     HYDROcodone-acetaminophen (NORCO) 5-325 MG tablet; Take 1-2 tablets by mouth every 4 (four) hours as needed.   I am having Ms. Casillas start on HYDROcodone-acetaminophen. I am also having her maintain her nitroGLYCERIN, metoprolol, warfarin, nystatin, levofloxacin, pantoprazole, and benzonatate.  Meds ordered this encounter  Medications  . HYDROcodone-acetaminophen (NORCO) 5-325 MG tablet    Sig: Take 1-2 tablets by mouth every 4 (four) hours as needed.    Dispense:  30 tablet    Refill:  0   She was fitted with a sugar tong splint and will follow-up with orthopedics she'll use a sling and was given pain medicine with instructions use ice and elevation.  Appropriate red flag conditions were discussed with the patient as well as actions that should be taken.  Patient expressed his  understanding.  Follow-up: Return if symptoms worsen or fail to improve.  Roselee Culver, MD   [facility] reading (PRIMARY) by  Dr. Ouida Sills.  Fracture distal radius and ulnar styloid.

## 2015-05-13 NOTE — Progress Notes (Signed)
Sugar tong splint and sling placed. Ring removed using ring cutter and hemostats.

## 2015-05-14 ENCOUNTER — Ambulatory Visit (INDEPENDENT_AMBULATORY_CARE_PROVIDER_SITE_OTHER): Payer: Medicare Other | Admitting: Pharmacist Clinician (PhC)/ Clinical Pharmacy Specialist

## 2015-05-14 DIAGNOSIS — D62 Acute posthemorrhagic anemia: Secondary | ICD-10-CM | POA: Diagnosis not present

## 2015-05-14 DIAGNOSIS — I82403 Acute embolism and thrombosis of unspecified deep veins of lower extremity, bilateral: Secondary | ICD-10-CM

## 2015-05-14 DIAGNOSIS — Z7901 Long term (current) use of anticoagulants: Secondary | ICD-10-CM | POA: Diagnosis not present

## 2015-05-14 DIAGNOSIS — M6281 Muscle weakness (generalized): Secondary | ICD-10-CM | POA: Diagnosis not present

## 2015-05-14 DIAGNOSIS — S52562A Barton's fracture of left radius, initial encounter for closed fracture: Secondary | ICD-10-CM | POA: Diagnosis not present

## 2015-05-14 DIAGNOSIS — I10 Essential (primary) hypertension: Secondary | ICD-10-CM | POA: Diagnosis not present

## 2015-05-14 DIAGNOSIS — E669 Obesity, unspecified: Secondary | ICD-10-CM | POA: Diagnosis not present

## 2015-05-14 DIAGNOSIS — Z86711 Personal history of pulmonary embolism: Secondary | ICD-10-CM | POA: Diagnosis not present

## 2015-05-14 DIAGNOSIS — M170 Bilateral primary osteoarthritis of knee: Secondary | ICD-10-CM | POA: Diagnosis not present

## 2015-05-14 DIAGNOSIS — I2782 Chronic pulmonary embolism: Secondary | ICD-10-CM | POA: Diagnosis not present

## 2015-05-14 LAB — POCT INR: INR: 3.4

## 2015-05-15 ENCOUNTER — Telehealth: Payer: Self-pay

## 2015-05-15 NOTE — Telephone Encounter (Signed)
Ok to hold warfarin for 5 days prior to surgery.  If on Jan 5, then last dose should be today Friday Dec 30.  Restart evening of surgery unless told otherwise by orthopedic surgeon

## 2015-05-15 NOTE — Telephone Encounter (Signed)
Requesting surgical clearance:   1. Type of surgery: Left Radius: left distal radius ORIF repair as indicated  2. Surgeon: Dr. Caralyn Guile  3. Surgical date: 05/21/2015  4. Medications that need to be held: coumidan  5. CAD: No     6. I will defer to: [facility] Attn: Santiago Bur [phone] phone [phone] fax

## 2015-05-18 NOTE — Telephone Encounter (Signed)
Given her prior DVTs and PEs she will require Lovenox bridging

## 2015-05-19 ENCOUNTER — Other Ambulatory Visit: Payer: Self-pay | Admitting: Pharmacist Clinician (PhC)/ Clinical Pharmacy Specialist

## 2015-05-19 MED ORDER — ENOXAPARIN SODIUM 100 MG/ML ~~LOC~~ SOLN: 100.0000 mg | Freq: Two times a day (BID) | SUBCUTANEOUS | Status: DC

## 2015-05-19 NOTE — Telephone Encounter (Signed)
Spoke with patient, surgery set for Thursday, she did not stop warfarin until after Sunday dose.  Advised that she will probably need to eat some green vegetables to ensure INR low enough for procedure on Thursday.  Will not be able to pick up lovenox until Wednesday mid-day, so will just bridge post procedure.  Spoke with both patient and family caregiver - patient to give 100mg  q12 h x 5 days starting Friday morning.  Also will take warfarin 4.5 mg x 3 then resume with normal dose.  Appt set for Wed Jan 11 to recheck warfarin

## 2015-05-19 NOTE — Telephone Encounter (Signed)
Pt clearance faxed to [place] with updated instructions.

## 2015-05-21 DIAGNOSIS — Y999 Unspecified external cause status: Secondary | ICD-10-CM | POA: Diagnosis not present

## 2015-05-21 DIAGNOSIS — Y939 Activity, unspecified: Secondary | ICD-10-CM | POA: Diagnosis not present

## 2015-05-21 DIAGNOSIS — Y929 Unspecified place or not applicable: Secondary | ICD-10-CM | POA: Diagnosis not present

## 2015-05-21 DIAGNOSIS — G8918 Other acute postprocedural pain: Secondary | ICD-10-CM | POA: Diagnosis not present

## 2015-05-21 DIAGNOSIS — S52572A Other intraarticular fracture of lower end of left radius, initial encounter for closed fracture: Secondary | ICD-10-CM | POA: Diagnosis not present

## 2015-05-22 ENCOUNTER — Encounter: Payer: Medicare Other | Admitting: Pharmacist Clinician (PhC)/ Clinical Pharmacy Specialist

## 2015-05-25 DIAGNOSIS — M170 Bilateral primary osteoarthritis of knee: Secondary | ICD-10-CM | POA: Diagnosis not present

## 2015-05-25 DIAGNOSIS — D62 Acute posthemorrhagic anemia: Secondary | ICD-10-CM | POA: Diagnosis not present

## 2015-05-25 DIAGNOSIS — Z86711 Personal history of pulmonary embolism: Secondary | ICD-10-CM | POA: Diagnosis not present

## 2015-05-25 DIAGNOSIS — I82403 Acute embolism and thrombosis of unspecified deep veins of lower extremity, bilateral: Secondary | ICD-10-CM | POA: Diagnosis not present

## 2015-05-25 DIAGNOSIS — I10 Essential (primary) hypertension: Secondary | ICD-10-CM | POA: Diagnosis not present

## 2015-05-25 DIAGNOSIS — M6281 Muscle weakness (generalized): Secondary | ICD-10-CM | POA: Diagnosis not present

## 2015-05-27 ENCOUNTER — Encounter: Payer: Medicare Other | Admitting: Pharmacist Clinician (PhC)/ Clinical Pharmacy Specialist

## 2015-05-28 DIAGNOSIS — I10 Essential (primary) hypertension: Secondary | ICD-10-CM | POA: Diagnosis not present

## 2015-05-28 DIAGNOSIS — M170 Bilateral primary osteoarthritis of knee: Secondary | ICD-10-CM | POA: Diagnosis not present

## 2015-05-28 DIAGNOSIS — D62 Acute posthemorrhagic anemia: Secondary | ICD-10-CM | POA: Diagnosis not present

## 2015-05-28 DIAGNOSIS — Z86711 Personal history of pulmonary embolism: Secondary | ICD-10-CM | POA: Diagnosis not present

## 2015-05-28 DIAGNOSIS — M6281 Muscle weakness (generalized): Secondary | ICD-10-CM | POA: Diagnosis not present

## 2015-05-28 DIAGNOSIS — I82403 Acute embolism and thrombosis of unspecified deep veins of lower extremity, bilateral: Secondary | ICD-10-CM | POA: Diagnosis not present

## 2015-06-03 ENCOUNTER — Encounter: Payer: Medicare Other | Admitting: Pharmacist Clinician (PhC)/ Clinical Pharmacy Specialist

## 2015-06-03 DIAGNOSIS — M170 Bilateral primary osteoarthritis of knee: Secondary | ICD-10-CM | POA: Diagnosis not present

## 2015-06-03 DIAGNOSIS — Z86711 Personal history of pulmonary embolism: Secondary | ICD-10-CM | POA: Diagnosis not present

## 2015-06-03 DIAGNOSIS — D62 Acute posthemorrhagic anemia: Secondary | ICD-10-CM | POA: Diagnosis not present

## 2015-06-03 DIAGNOSIS — I10 Essential (primary) hypertension: Secondary | ICD-10-CM | POA: Diagnosis not present

## 2015-06-03 DIAGNOSIS — M6281 Muscle weakness (generalized): Secondary | ICD-10-CM | POA: Diagnosis not present

## 2015-06-03 DIAGNOSIS — I82403 Acute embolism and thrombosis of unspecified deep veins of lower extremity, bilateral: Secondary | ICD-10-CM | POA: Diagnosis not present

## 2015-06-05 DIAGNOSIS — M170 Bilateral primary osteoarthritis of knee: Secondary | ICD-10-CM | POA: Diagnosis not present

## 2015-06-05 DIAGNOSIS — D62 Acute posthemorrhagic anemia: Secondary | ICD-10-CM | POA: Diagnosis not present

## 2015-06-05 DIAGNOSIS — M6281 Muscle weakness (generalized): Secondary | ICD-10-CM | POA: Diagnosis not present

## 2015-06-05 DIAGNOSIS — I10 Essential (primary) hypertension: Secondary | ICD-10-CM | POA: Diagnosis not present

## 2015-06-05 DIAGNOSIS — Z86711 Personal history of pulmonary embolism: Secondary | ICD-10-CM | POA: Diagnosis not present

## 2015-06-05 DIAGNOSIS — I82403 Acute embolism and thrombosis of unspecified deep veins of lower extremity, bilateral: Secondary | ICD-10-CM | POA: Diagnosis not present

## 2015-06-09 DIAGNOSIS — S52562D Barton's fracture of left radius, subsequent encounter for closed fracture with routine healing: Secondary | ICD-10-CM | POA: Diagnosis not present

## 2015-06-11 DIAGNOSIS — N762 Acute vulvitis: Secondary | ICD-10-CM | POA: Diagnosis not present

## 2015-06-12 ENCOUNTER — Telehealth: Payer: Self-pay | Admitting: Cardiovascular Disease

## 2015-06-12 DIAGNOSIS — D62 Acute posthemorrhagic anemia: Secondary | ICD-10-CM | POA: Diagnosis not present

## 2015-06-12 DIAGNOSIS — I10 Essential (primary) hypertension: Secondary | ICD-10-CM | POA: Diagnosis not present

## 2015-06-12 DIAGNOSIS — M170 Bilateral primary osteoarthritis of knee: Secondary | ICD-10-CM | POA: Diagnosis not present

## 2015-06-12 DIAGNOSIS — M6281 Muscle weakness (generalized): Secondary | ICD-10-CM | POA: Diagnosis not present

## 2015-06-12 DIAGNOSIS — Z86711 Personal history of pulmonary embolism: Secondary | ICD-10-CM | POA: Diagnosis not present

## 2015-06-12 DIAGNOSIS — I82403 Acute embolism and thrombosis of unspecified deep veins of lower extremity, bilateral: Secondary | ICD-10-CM | POA: Diagnosis not present

## 2015-06-12 NOTE — Telephone Encounter (Signed)
Pt of Dr. [name]   c/o leg swelling, SOB on exertion. This has been going on, as pt reports, for ~1 year. She thinks this has been a little worse in the last few weeks. She saw Dr. Helane Rima, OBGYN, today who was concerned and wanted her to be seen by [place].  Pt does not have HF dx. We have seen for HTN, vascular issues. She notes she has put on a few lbs around the holidays. Uncertain if this is fluid or dry weight. Discussed symptoms w/ patient, she acknowledged no acute changes in the last few weeks but did want to investigate on this. She states sometime ago she had been recommended to have an echocardiogram, but she had to cancel/put this off d/t a family illness. She is aware that we have arranged appt to see Cecille Rubin on Monday at 9:30am. She is aware that if she is to have acute/worse issues over weekend, she is able to get assistance from on-call provider or to go to emergency room as symptoms dictate.  Routed to [facility] to see if anything further advised.

## 2015-06-12 NOTE — Telephone Encounter (Signed)
New message    patient went to ob/ gyn on yesterday seen Dr. Dian Queen [facility] -  C/O swollen, sob , patient need to be seen today  - might be going in heart failure.

## 2015-06-14 NOTE — Telephone Encounter (Signed)
This sounds appropriate.

## 2015-06-15 ENCOUNTER — Encounter: Payer: Self-pay | Admitting: Nurse Practitioner

## 2015-06-15 ENCOUNTER — Ambulatory Visit (INDEPENDENT_AMBULATORY_CARE_PROVIDER_SITE_OTHER): Payer: Medicare Other | Admitting: Nurse Practitioner

## 2015-06-15 VITALS — BP 108/76 | HR 104 | Ht 62.0 in | Wt 227.0 lb

## 2015-06-15 DIAGNOSIS — I10 Essential (primary) hypertension: Secondary | ICD-10-CM

## 2015-06-15 DIAGNOSIS — R0600 Dyspnea, unspecified: Secondary | ICD-10-CM | POA: Diagnosis not present

## 2015-06-15 DIAGNOSIS — I2782 Chronic pulmonary embolism: Secondary | ICD-10-CM

## 2015-06-15 LAB — BASIC METABOLIC PANEL
Anion gap: 12 (ref 5–15)
BUN: 11 mg/dL (ref 6–20)
CO2: 27 mmol/L (ref 22–32)
Calcium: 8.5 mg/dL — ABNORMAL LOW (ref 8.9–10.3)
Chloride: 103 mmol/L (ref 101–111)
Creatinine, Ser: 0.76 mg/dL (ref 0.44–1.00)
GFR calc Af Amer: >60 mL/min (ref >60)
GFR calc non Af Amer: >60 mL/min (ref >60)
Glucose, Bld: 120 mg/dL — ABNORMAL HIGH (ref 65–99)
Potassium: 3.6 mmol/L (ref 3.5–5.1)
Sodium: 142 mmol/L (ref 135–145)

## 2015-06-15 LAB — CBC WITH DIFFERENTIAL/PLATELET
Basophils Absolute: 0 10*3/uL (ref 0.0–0.1)
Basophils Relative: 0 %
Eosinophils Absolute: 0.1 10*3/uL (ref 0.0–0.7)
Eosinophils Relative: 2 %
HCT: 29.8 % — ABNORMAL LOW (ref 36.0–46.0)
Hemoglobin: 8.1 g/dL — ABNORMAL LOW (ref 12.0–15.0)
Lymphocytes Relative: 22 %
Lymphs Abs: 1.4 10*3/uL (ref 0.7–4.0)
MCH: 19.6 pg — ABNORMAL LOW (ref 26.0–34.0)
MCHC: 27.2 g/dL — ABNORMAL LOW (ref 30.0–36.0)
MCV: 72 fL — ABNORMAL LOW (ref 78.0–100.0)
Monocytes Absolute: 0.5 10*3/uL (ref 0.1–1.0)
Monocytes Relative: 8 %
Neutro Abs: 4.5 10*3/uL (ref 1.7–7.7)
Neutrophils Relative %: 68 %
Platelets: 435 10*3/uL — ABNORMAL HIGH (ref 150–400)
RBC: 4.14 MIL/uL (ref 3.87–5.11)
RDW: 24.5 % — ABNORMAL HIGH (ref 11.5–15.5)
WBC: 6.5 10*3/uL (ref 4.0–10.5)

## 2015-06-15 LAB — HEPATIC FUNCTION PANEL
ALT: 11 U/L — ABNORMAL LOW (ref 14–54)
AST: 20 U/L (ref 15–41)
Albumin: 2.9 g/dL — ABNORMAL LOW (ref 3.5–5.0)
Alkaline Phosphatase: 66 U/L (ref 38–126)
Bilirubin, Direct: 0.5 mg/dL (ref 0.1–0.5)
Indirect Bilirubin: 1.1 mg/dL — ABNORMAL HIGH (ref 0.3–0.9)
Total Bilirubin: 1.6 mg/dL — ABNORMAL HIGH (ref 0.3–1.2)
Total Protein: 5.7 g/dL — ABNORMAL LOW (ref 6.5–8.1)

## 2015-06-15 LAB — BRAIN NATRIURETIC PEPTIDE: B Natriuretic Peptide: 685.5 pg/mL — ABNORMAL HIGH (ref 0.0–100.0)

## 2015-06-15 MED ORDER — PANTOPRAZOLE SODIUM 40 MG PO TBEC: 40.0000 mg | DELAYED_RELEASE_TABLET | Freq: Two times a day (BID) | ORAL | Status: DC

## 2015-06-15 MED ORDER — FUROSEMIDE 40 MG PO TABS: 40.0000 mg | ORAL_TABLET | Freq: Every day | ORAL | Status: DC

## 2015-06-15 NOTE — Progress Notes (Signed)
CARDIOLOGY OFFICE NOTE  Date:  06/15/2015    Nicole Gibbs Date of Birth: 1946-03-02 Medical Record [id number]  PCP:  [name], MD  Cardiologist:  [name]    Chief Complaint  Patient presents with  . Shortness of Breath    Work in visit - seen for Dr. [name]    History of Present Illness: Nicole Gibbs is a 70 y.o. female who presents today for a work in visit. Seen for Dr. [name]. 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   Saw Dr. [name] last back in June. Complained of DOE. He wanted to update her echo and Myoview - she did not follow thru du to family illness. He felt her symptoms were more likely from obesity and deconditioning.   Was admitted with GI bleed back in December - HGB down to 6.2. Seen by GI and had negative EGD and colonoscopy - PPI was started. Was transfused 4 units PRBCs.   Phone call from Friday - "c/o leg swelling, SOB on exertion. This has been going on, as pt reports, for ~1 year. She thinks this has been a little worse in the last few weeks. She saw Dr. Helane Rima, OBGYN, today who was concerned and wanted her to be seen by [place]. Pt does not have HF dx. We have seen for HTN, vascular issues. She notes she has put on a few lbs around the holidays. Uncertain if this is fluid or dry weight.  Discussed symptoms w/ patient, she acknowledged no acute changes in the last few weeks but did want to investigate on this. She states sometime ago she had been recommended to have an echocardiogram, but she had to cancel/put this off d/t a family illness.  She is aware that we have arranged appt to see Cecille Rubin on Monday at 9:30am. She is aware that if she is to have acute/worse issues over weekend, she is able to get assistance from on-call provider or to go to emergency room as symptoms dictate."  Thus added to my schedule.   Comes in today. Here alone. She says she has had progressive shortness of  breath - for over a year but worse here recently. Has had a fall since her admission - broke her left wrist. +cough - dry. Not eating well. "Awful swelling" - says she has gained 10 pounds since Christmas and does not know why since she is not eating. Weight is actually down from her last visit with Dr. [name]. She is not really able to tell me why she was admitted back in December - does not really know the details - not sent home on PPI therapy or oxygen. Reports her stool is dark. She is incontinent of urine at night.  No chest pain. Notes more swelling in her abdomen and her legs.   She was previously seen by [facility] but no longer followed there.   Past Medical History  Diagnosis Date  . Thyroid nodule     no meds  . Hypertension   . Pulmonary embolism, bilateral ([place])   . DVT, bilateral lower limbs ([place])   . Shortness of breath   . Arthritis     knees  . Postmenopausal vaginal bleeding   . Rash     under breasts  . Nocturia   . Obese   . Diastolic dysfunction     grade 1  . Aortic regurgitation     mild  .  Mitral valve annular calcification     mild  . Hypokinesis     EF 51%    Past Surgical History  Procedure Laterality Date  . Cesarean section  1992  . Hysteroscopy w/d&c  03-24-2009  . Dilation and curettage of uterus  06/20/2012    Procedure: DILATATION AND CURETTAGE;  Surgeon: [name], MD;  Location: [facility];  Service: Gynecology;  Laterality: N/A;  . Dilation and evacution  1988    missed ab  . Cardiovascular stress test  08-07-2012  DR HILTY/ DR BERRY    LOW RISK NUCLEAR STUDY/ NO ISCHEMIA/ FIXED MID TO DISTAL ANTERIOR AND APICAL DEFECT MAY REPRESENT SCAR VS BREAST ATTENUATION ARTIFACT/  MILD DISTAL ANTERIOR HYPOKINESIS/ EF 51%  . Transthoracic echocardiogram  08-08-2012    LVSF NORMAL/ EF [id number]  GRADE I DIASTOLIC DYSFUNCTION/ MILD AV AND MV REGURG./  RVSF MILDLY REDUCED  . Dilitation & currettage/hystroscopy with thermachoice ablation N/A  09/04/2012    Procedure: DILATATION & CURETTAGE WITH THERMACHOICE ABLATION;  Surgeon: [name], MD;  Location: [facility];  Service: Gynecology;  Laterality: N/A;  . Esophagogastroduodenoscopy N/A 05/07/2015    Procedure: ESOPHAGOGASTRODUODENOSCOPY (EGD);  Surgeon: Laurence Spates, MD;  Location: [facility];  Service: Endoscopy;  Laterality: N/A;  . Colonoscopy N/A 05/08/2015    Procedure: COLONOSCOPY;  Surgeon: Laurence Spates, MD;  Location: [facility];  Service: Endoscopy;  Laterality: N/A;     Medications: Current Outpatient Prescriptions  Medication Sig Dispense Refill  . fluconazole (DIFLUCAN) 100 MG tablet Take 100 mg by mouth once.     Marland Kitchen HYDROcodone-acetaminophen (NORCO) 5-325 MG tablet Take 1-2 tablets by mouth every 4 (four) hours as needed. 30 tablet 0  . metoprolol (LOPRESSOR) 50 MG tablet Take 2 tablets in the morning and 1 tablet in the evening. 270 tablet 1  . nitroGLYCERIN (NITROSTAT) 0.4 MG SL tablet Place 1 tablet (0.4 mg total) under the tongue every 5 (five) minutes as needed for chest pain. 25 tablet 3  . warfarin (COUMADIN) 3 MG tablet TAKE 1 TO 1 AND 1/2 TABLETS BY MOUTH DAILY OR AS DIRECTED (Patient taking differently: Take 3 mg by mouth on Mon, Tue, Wed, Fri, and Sat. Take 4.5 mg by mouth daily on Sun and Thur.) 120 tablet 1  . zolpidem (AMBIEN) 5 MG tablet Take 5 mg by mouth at bedtime.     . furosemide (LASIX) 40 MG tablet Take 1 tablet (40 mg total) by mouth daily. 30 tablet 6  . pantoprazole (PROTONIX) 40 MG tablet Take 1 tablet (40 mg total) by mouth 2 (two) times daily. 60 tablet 3   No current facility-administered medications for this visit.    Allergies: Allergies  Allergen Reactions  . Sulfa Antibiotics Hives    Social History: The patient  reports that she has never smoked. She has never used smokeless tobacco. She reports that she does not drink alcohol or use illicit drugs.   Family History: The patient's family history  includes Coronary artery disease (age of onset: 67) in her father; Hypertension in her mother; Mental illness in her mother; Stroke in her father; Sudden death in her sister.   Review of Systems: Please see the history of present illness.   Otherwise, the review of systems is positive for none.   All other systems are reviewed and negative.   Physical Exam: VS:  BP 108/76 mmHg  Pulse 104  Ht 5\' 2"  (1.575 m)  Wt 227 lb (102.967 kg)  BMI 41.51 kg/m2  SpO2 96% .  BMI Body mass index is 41.51 kg/(m^2).  Wt Readings from Last 3 Encounters:  06/15/15 227 lb (102.967 kg)  05/13/15 227 lb (102.967 kg)  05/08/15 227 lb 8.2 oz (103.2 kg)    General: Pleasant. She is obese - her weight is down from 236 back in June. She is alert and in no acute distress. Her color is horridly pale and sallow.  HEENT: Normal. Neck: Supple, no JVD, carotid bruits, or masses noted.  Cardiac: Regular rate and rhythm. Little fast. No murmurs, rubs, or gallops. Legs are quite full with edema.  Respiratory:  Lungs are fairly clear to auscultation bilaterally with normal work of breathing.  GI: Obese. Large pannus. Soft and nontender.  MS: No deformity or atrophy. Gait and ROM intact but has a cast on the left forearm. Skin: Warm and dry. Color is grossly pale/sallow. Neuro:  Strength and sensation are intact and no gross focal deficits noted.  Psych: Alert, appropriate and with normal affect.   LABORATORY DATA:  EKG:  EKG is not ordered today.   Lab Results  Component Value Date   WBC 6.5 06/15/2015   HGB 8.1* 06/15/2015   HCT 29.8* 06/15/2015   PLT 435* 06/15/2015   GLUCOSE 120* 06/15/2015   ALT 11* 06/15/2015   AST 20 06/15/2015   NA 142 06/15/2015   K 3.6 06/15/2015   CL 103 06/15/2015   CREATININE 0.76 06/15/2015   BUN 11 06/15/2015   CO2 27 06/15/2015   TSH 0.368 08/07/2012   INR 3.4 05/14/2015    Lab Results  Component Value Date   INR 3.4 05/14/2015   INR 1.52* 05/11/2015   INR 1.33  05/10/2015     BNP (last 3 results)  Recent Labs  06/15/15 1044  BNP 685.5*    ProBNP (last 3 results) No results for input(s): PROBNP in the last 8760 hours.   Other Studies Reviewed Today:  Echo Study Conclusions from 07/2012  - Left ventricle: The cavity size was normal. Wall thickness was normal. Systolic function was normal. The estimated ejection fraction was in the range of 55% to 60%. Wall motion was normal; there were no regional wall motion abnormalities. Doppler parameters are consistent with abnormal left ventricular relaxation (grade 1 diastolic dysfunction). - Aortic valve: Mildly calcified annulus. Trileaflet; mildly thickened, mildly calcified leaflets. Mild regurgitation. - Mitral valve: Mildly calcified annulus. Mildly thickened and calcified posterior leaflet . - Right ventricle: The cavity size was mildly dilated. Systolic function was mildly reduced. - Atrial septum: No defect or patent foramen ovale was identified.   Myoview Overall Impression from 07/2012:  Low risk stress nuclear study. There is a fixed mid to distal anterior and apical defect which may represent scar versus breast attenuation artifact. There is hypokinesis of the distal anterior wall as well, favoring scar. RV uptake noted, suggesting possible pulmonary hypertension.  LV Wall Motion: Mild distal anterior hypokinesis. EF 51%.  Pixie Casino, MD, [facility]  Assessment/Plan: 1. Dyspnea - I suspect this is from anemia - her color is quite pale. Needs stat labs before further treatment recommended. If she is not anemic - would recommend proceeding with an echocardiogram.   I have gotten her lab back - HGB is 8.1. BNP is elevated. Will start Lasix 40 mg a day. Arrange for echocardiogram.  Get back to GI. SHE DOES NOT HAVE A PCP - WILL TRY TO ARRANGE. Further disposition to follow.   2. Recent GI  bleed - negative EGD and colonoscopy - she had 4 units of blood. She  was not on PPI therapy. No obvious source appeared to have been identified. I am starting this today. Will get her back to GI as well. May require repeat transfusion. Needs to get to primary care as well.   3. Obesity  4. Diastolic dysfunction  5. HTN  6. Chronic coumadin therapy  7. Prior PE/DVT  Current medicines are reviewed with the patient today.  The patient does not have concerns regarding medicines other than what has been noted above.  The following changes have been made:  See above.  Labs/ tests ordered today include:    Orders Placed This Encounter  Procedures  . Brain natriuretic peptide  . CBC with Differential/Platelet  . Basic metabolic panel  . Hepatic function panel  . Basic metabolic panel  . Ambulatory referral to Internal Medicine     Disposition:   Follow up at [facility] after echo. Lab on the day of her echo. Sending back to GI and trying to arrange primary care.   Patient is agreeable to this plan and will call if any problems develop in the interim.   Signed: Burtis Junes, RN, ANP-C 06/15/2015 11:54 AM  [facility] [address] [place], [place]  [address] Phone: [phone] Fax: [phone]

## 2015-06-15 NOTE — Patient Instructions (Addendum)
We will be checking the following labs today - STAT CBC with DIFF, STAT BMET, STAT BNP and STAT HPF   Medication Instructions:    Continue with your current medicines. BUT  I am increasing the Lasix (furosemide) to 40 mg a day - every day - this is at the drug store  I am adding Protonix 40 mg to take twice a day - this may help your blood count come up - this is at the drug store    Testing/Procedures To Be Arranged:  Echocardiogram  Follow-Up:   See Dr. Laurence Spates of [facility] (or his NP/PA) - hospital follow up and persistent anemia  Try to arrange primary care - lives near [facility] [address]  Needs to see Dr. [name] (or PA/NP) in 2 to 3 weeks for a follow up/recall visit.     Other Special Instructions:   BMET on the day of the echo.     If you need a refill on your cardiac medications before your next appointment, please call your pharmacy.   Call the [place] office at [phone] if you have any questions, problems or concerns.

## 2015-06-16 ENCOUNTER — Other Ambulatory Visit: Payer: Self-pay | Admitting: *Deleted

## 2015-06-16 DIAGNOSIS — R0602 Shortness of breath: Secondary | ICD-10-CM

## 2015-06-17 DIAGNOSIS — I82403 Acute embolism and thrombosis of unspecified deep veins of lower extremity, bilateral: Secondary | ICD-10-CM | POA: Diagnosis not present

## 2015-06-17 DIAGNOSIS — Z86711 Personal history of pulmonary embolism: Secondary | ICD-10-CM | POA: Diagnosis not present

## 2015-06-17 DIAGNOSIS — M170 Bilateral primary osteoarthritis of knee: Secondary | ICD-10-CM | POA: Diagnosis not present

## 2015-06-17 DIAGNOSIS — M6281 Muscle weakness (generalized): Secondary | ICD-10-CM | POA: Diagnosis not present

## 2015-06-17 DIAGNOSIS — I10 Essential (primary) hypertension: Secondary | ICD-10-CM | POA: Diagnosis not present

## 2015-06-17 DIAGNOSIS — D62 Acute posthemorrhagic anemia: Secondary | ICD-10-CM | POA: Diagnosis not present

## 2015-06-19 DIAGNOSIS — M170 Bilateral primary osteoarthritis of knee: Secondary | ICD-10-CM | POA: Diagnosis not present

## 2015-06-19 DIAGNOSIS — D62 Acute posthemorrhagic anemia: Secondary | ICD-10-CM | POA: Diagnosis not present

## 2015-06-19 DIAGNOSIS — I10 Essential (primary) hypertension: Secondary | ICD-10-CM | POA: Diagnosis not present

## 2015-06-19 DIAGNOSIS — M6281 Muscle weakness (generalized): Secondary | ICD-10-CM | POA: Diagnosis not present

## 2015-06-19 DIAGNOSIS — I82403 Acute embolism and thrombosis of unspecified deep veins of lower extremity, bilateral: Secondary | ICD-10-CM | POA: Diagnosis not present

## 2015-06-19 DIAGNOSIS — Z86711 Personal history of pulmonary embolism: Secondary | ICD-10-CM | POA: Diagnosis not present

## 2015-06-23 ENCOUNTER — Other Ambulatory Visit: Payer: Self-pay | Admitting: Nurse Practitioner

## 2015-06-23 ENCOUNTER — Telehealth: Payer: Self-pay | Admitting: Nurse Practitioner

## 2015-06-23 MED ORDER — CEPHALEXIN 250 MG PO CAPS: 250.0000 mg | ORAL_CAPSULE | Freq: Four times a day (QID) | ORAL | Status: DC

## 2015-06-23 NOTE — Telephone Encounter (Signed)
New message  Pt c/o medication issue: 1. Name of Medication:  furosemide (LASIX) 40 MG tablet pantoprazole (PROTONIX) 40 MG tablet   4. What is your medication issue? A lot of fluid. She was taking them one at a time. But she took both of them at the same time and now she has broken out in hives. Please call back to discuss.

## 2015-06-23 NOTE — Progress Notes (Signed)
Phone call today with patient. She notes her lower legs are red, "burning" and hot to touch. No other affected areas.  She wants to stop the Lasix and Protonix. I told her it sounded more like she has cellulitis but if she wants to stop the Lasix/Protonix for a few days, then that is ok.   Sent in Keflex 250 mg QID #28 with no refills.   She has multiple appointments on Monday with labs and echo.   Burtis Junes, RN, [facility] [address] [place], [place]  [id number] [phone]

## 2015-06-23 NOTE — Telephone Encounter (Signed)
Follow up     Pt picked up presc Cecille Rubin called in.  It was generic keflex.  She had an reaction 60yrs ago to keflex.  She states she had severe diarrhea while on this rx. She is scared to take it.  Please call

## 2015-06-23 NOTE — Telephone Encounter (Signed)
Nicole Merle, NP, stated Nicole Gibbs can go to urgent care to get evaluated.   Nicole Gibbs stated will not take antibiotic and has a doctor's appointment tomorrow.  Cecille Rubin stated that would be fine.  Stated keflex is not on allergy list and if sx is severe diarrhea  that would be an intolerance   Will ask doctor tomorrow to re-evaluate Nicole Gibbs has doctor appointment.

## 2015-06-24 DIAGNOSIS — M6281 Muscle weakness (generalized): Secondary | ICD-10-CM | POA: Diagnosis not present

## 2015-06-24 DIAGNOSIS — I82403 Acute embolism and thrombosis of unspecified deep veins of lower extremity, bilateral: Secondary | ICD-10-CM | POA: Diagnosis not present

## 2015-06-24 DIAGNOSIS — R600 Localized edema: Secondary | ICD-10-CM | POA: Diagnosis not present

## 2015-06-24 DIAGNOSIS — M170 Bilateral primary osteoarthritis of knee: Secondary | ICD-10-CM | POA: Diagnosis not present

## 2015-06-24 DIAGNOSIS — D62 Acute posthemorrhagic anemia: Secondary | ICD-10-CM | POA: Diagnosis not present

## 2015-06-24 DIAGNOSIS — D649 Anemia, unspecified: Secondary | ICD-10-CM | POA: Diagnosis not present

## 2015-06-24 DIAGNOSIS — Z86711 Personal history of pulmonary embolism: Secondary | ICD-10-CM | POA: Diagnosis not present

## 2015-06-24 DIAGNOSIS — I10 Essential (primary) hypertension: Secondary | ICD-10-CM | POA: Diagnosis not present

## 2015-06-25 DIAGNOSIS — D62 Acute posthemorrhagic anemia: Secondary | ICD-10-CM | POA: Diagnosis not present

## 2015-06-25 DIAGNOSIS — M6281 Muscle weakness (generalized): Secondary | ICD-10-CM | POA: Diagnosis not present

## 2015-06-25 DIAGNOSIS — I82403 Acute embolism and thrombosis of unspecified deep veins of lower extremity, bilateral: Secondary | ICD-10-CM | POA: Diagnosis not present

## 2015-06-25 DIAGNOSIS — I10 Essential (primary) hypertension: Secondary | ICD-10-CM | POA: Diagnosis not present

## 2015-06-25 DIAGNOSIS — M170 Bilateral primary osteoarthritis of knee: Secondary | ICD-10-CM | POA: Diagnosis not present

## 2015-06-25 DIAGNOSIS — Z86711 Personal history of pulmonary embolism: Secondary | ICD-10-CM | POA: Diagnosis not present

## 2015-06-26 DIAGNOSIS — M170 Bilateral primary osteoarthritis of knee: Secondary | ICD-10-CM | POA: Diagnosis not present

## 2015-06-26 DIAGNOSIS — Z86711 Personal history of pulmonary embolism: Secondary | ICD-10-CM | POA: Diagnosis not present

## 2015-06-26 DIAGNOSIS — D62 Acute posthemorrhagic anemia: Secondary | ICD-10-CM | POA: Diagnosis not present

## 2015-06-26 DIAGNOSIS — I82403 Acute embolism and thrombosis of unspecified deep veins of lower extremity, bilateral: Secondary | ICD-10-CM | POA: Diagnosis not present

## 2015-06-26 DIAGNOSIS — M6281 Muscle weakness (generalized): Secondary | ICD-10-CM | POA: Diagnosis not present

## 2015-06-26 DIAGNOSIS — I10 Essential (primary) hypertension: Secondary | ICD-10-CM | POA: Diagnosis not present

## 2015-06-29 ENCOUNTER — Other Ambulatory Visit: Payer: Self-pay

## 2015-06-29 ENCOUNTER — Other Ambulatory Visit (INDEPENDENT_AMBULATORY_CARE_PROVIDER_SITE_OTHER): Payer: Medicare Other | Admitting: *Deleted

## 2015-06-29 ENCOUNTER — Ambulatory Visit (HOSPITAL_COMMUNITY): Payer: Medicare Other | Attending: Internal Medicine

## 2015-06-29 DIAGNOSIS — I10 Essential (primary) hypertension: Secondary | ICD-10-CM | POA: Diagnosis not present

## 2015-06-29 DIAGNOSIS — I351 Nonrheumatic aortic (valve) insufficiency: Secondary | ICD-10-CM | POA: Diagnosis not present

## 2015-06-29 DIAGNOSIS — I517 Cardiomegaly: Secondary | ICD-10-CM | POA: Diagnosis not present

## 2015-06-29 DIAGNOSIS — Z8249 Family history of ischemic heart disease and other diseases of the circulatory system: Secondary | ICD-10-CM | POA: Insufficient documentation

## 2015-06-29 DIAGNOSIS — R0602 Shortness of breath: Secondary | ICD-10-CM

## 2015-06-29 DIAGNOSIS — R0600 Dyspnea, unspecified: Secondary | ICD-10-CM

## 2015-06-29 DIAGNOSIS — I2782 Chronic pulmonary embolism: Secondary | ICD-10-CM

## 2015-06-29 DIAGNOSIS — S52562D Barton's fracture of left radius, subsequent encounter for closed fracture with routine healing: Secondary | ICD-10-CM | POA: Diagnosis not present

## 2015-06-30 DIAGNOSIS — I82403 Acute embolism and thrombosis of unspecified deep veins of lower extremity, bilateral: Secondary | ICD-10-CM | POA: Diagnosis not present

## 2015-06-30 DIAGNOSIS — M6281 Muscle weakness (generalized): Secondary | ICD-10-CM | POA: Diagnosis not present

## 2015-06-30 DIAGNOSIS — M170 Bilateral primary osteoarthritis of knee: Secondary | ICD-10-CM | POA: Diagnosis not present

## 2015-06-30 DIAGNOSIS — I10 Essential (primary) hypertension: Secondary | ICD-10-CM | POA: Diagnosis not present

## 2015-06-30 DIAGNOSIS — Z86711 Personal history of pulmonary embolism: Secondary | ICD-10-CM | POA: Diagnosis not present

## 2015-06-30 DIAGNOSIS — D62 Acute posthemorrhagic anemia: Secondary | ICD-10-CM | POA: Diagnosis not present

## 2015-06-30 LAB — BASIC METABOLIC PANEL
BUN: 16 mg/dL (ref 7–25)
CO2: 25 mmol/L (ref 20–31)
Calcium: 8.7 mg/dL (ref 8.6–10.4)
Chloride: 104 mmol/L (ref 98–110)
Creat: 0.57 mg/dL (ref 0.50–0.99)
Glucose, Bld: 164 mg/dL — ABNORMAL HIGH (ref 65–99)
Potassium: 3.5 mmol/L (ref 3.5–5.3)
Sodium: 140 mmol/L (ref 135–146)

## 2015-07-01 ENCOUNTER — Encounter: Payer: Self-pay | Admitting: Nurse Practitioner

## 2015-07-01 ENCOUNTER — Ambulatory Visit (INDEPENDENT_AMBULATORY_CARE_PROVIDER_SITE_OTHER): Payer: Medicare Other | Admitting: Nurse Practitioner

## 2015-07-01 ENCOUNTER — Ambulatory Visit (INDEPENDENT_AMBULATORY_CARE_PROVIDER_SITE_OTHER): Payer: Medicare Other | Admitting: Pharmacist Clinician (PhC)/ Clinical Pharmacy Specialist

## 2015-07-01 ENCOUNTER — Encounter: Payer: Self-pay | Admitting: Cardiovascular Disease

## 2015-07-01 VITALS — BP 126/68 | HR 88 | Ht 62.0 in | Wt 211.0 lb

## 2015-07-01 DIAGNOSIS — I5042 Chronic combined systolic (congestive) and diastolic (congestive) heart failure: Secondary | ICD-10-CM | POA: Insufficient documentation

## 2015-07-01 DIAGNOSIS — I119 Hypertensive heart disease without heart failure: Secondary | ICD-10-CM | POA: Insufficient documentation

## 2015-07-01 DIAGNOSIS — I420 Dilated cardiomyopathy: Secondary | ICD-10-CM | POA: Diagnosis not present

## 2015-07-01 DIAGNOSIS — I2699 Other pulmonary embolism without acute cor pulmonale: Secondary | ICD-10-CM | POA: Diagnosis not present

## 2015-07-01 DIAGNOSIS — Z7901 Long term (current) use of anticoagulants: Secondary | ICD-10-CM

## 2015-07-01 DIAGNOSIS — I110 Hypertensive heart disease with heart failure: Secondary | ICD-10-CM | POA: Diagnosis not present

## 2015-07-01 DIAGNOSIS — Z86711 Personal history of pulmonary embolism: Secondary | ICD-10-CM | POA: Diagnosis not present

## 2015-07-01 DIAGNOSIS — I5041 Acute combined systolic (congestive) and diastolic (congestive) heart failure: Secondary | ICD-10-CM | POA: Diagnosis not present

## 2015-07-01 DIAGNOSIS — I509 Heart failure, unspecified: Secondary | ICD-10-CM | POA: Diagnosis not present

## 2015-07-01 DIAGNOSIS — I82403 Acute embolism and thrombosis of unspecified deep veins of lower extremity, bilateral: Secondary | ICD-10-CM

## 2015-07-01 DIAGNOSIS — I2782 Chronic pulmonary embolism: Secondary | ICD-10-CM

## 2015-07-01 LAB — CBC
HEMATOCRIT: 26.8 % — AB (ref 36.0–46.0)
Hemoglobin: 7.1 g/dL — ABNORMAL LOW (ref 12.0–15.0)
MCH: 17.9 pg — ABNORMAL LOW (ref 26.0–34.0)
MCHC: 26.5 g/dL — ABNORMAL LOW (ref 30.0–36.0)
MCV: 67.7 fL — AB (ref 78.0–100.0)
MPV: 9.2 fL (ref 8.6–12.4)
Platelets: 612 10*3/uL — ABNORMAL HIGH (ref 150–400)
RBC: 3.96 MIL/uL (ref 3.87–5.11)
RDW: 22.4 % — AB (ref 11.5–15.5)
WBC: 8 10*3/uL (ref 4.0–10.5)

## 2015-07-01 LAB — POCT INR: INR: 3.4

## 2015-07-01 MED ORDER — POTASSIUM CHLORIDE CRYS ER 20 MEQ PO TBCR: 20.0000 meq | EXTENDED_RELEASE_TABLET | Freq: Two times a day (BID) | ORAL | Status: DC

## 2015-07-01 MED ORDER — ENOXAPARIN SODIUM 100 MG/ML ~~LOC~~ SOLN: SUBCUTANEOUS | Status: DC

## 2015-07-01 MED ORDER — ZOLPIDEM TARTRATE 5 MG PO TABS: 5.0000 mg | ORAL_TABLET | Freq: Every evening | ORAL | Status: DC | PRN

## 2015-07-01 MED ORDER — LISINOPRIL 2.5 MG PO TABS: 2.5000 mg | ORAL_TABLET | Freq: Every day | ORAL | Status: DC

## 2015-07-01 MED ORDER — FUROSEMIDE 40 MG PO TABS: 40.0000 mg | ORAL_TABLET | Freq: Two times a day (BID) | ORAL | Status: DC

## 2015-07-01 NOTE — Progress Notes (Signed)
Cardiology Clinic Note   Patient Name: Nicole Gibbs Date of Encounter: 07/01/2015  Primary Care Provider:  [name], MD Primary Cardiologist:  Adora Fridge, MD   Patient Profile    70 year old female with a history of hypertension and more recently anemia and edema, who presents for follow-up after recently being found to have LV dysfunction.   Past Medical History  Diagnosis Date  . Thyroid nodule     no meds  . Hypertensive heart disease   . Pulmonary embolism, bilateral (HCC)     a. chronic coumadin.  . DVT, bilateral lower limbs ([place])   . Arthritis     knees  . Postmenopausal vaginal bleeding   . Rash     under breasts  . Nocturia   . Obese   . Chronic combined systolic and diastolic CHF (congestive heart failure) ([place])     a. 07/2012 Echo: EF 55-60%, Gr1 DD;  b. 06/2015 Echo: EF 35-40%, triv AI, Mod MR, mod dil LA, mildly reduced RV.  . H/O cardiovascular stress test     a. 07/2012 MV: fixed mid-dist anterior and apical defect - scar vs attenuation, distal ant HK, no reversibility, EF 51%-->low risk.   Past Surgical History  Procedure Laterality Date  . Cesarean section  1992  . Hysteroscopy w/d&c  03-24-2009  . Dilation and curettage of uterus  06/20/2012    Procedure: DILATATION AND CURETTAGE;  Surgeon: [name], MD;  Location: [facility];  Service: Gynecology;  Laterality: N/A;  . Dilation and evacution  1988    missed ab  . Cardiovascular stress test  08-07-2012  DR HILTY/ DR BERRY    LOW RISK NUCLEAR STUDY/ NO ISCHEMIA/ FIXED MID TO DISTAL ANTERIOR AND APICAL DEFECT MAY REPRESENT SCAR VS BREAST ATTENUATION ARTIFACT/  MILD DISTAL ANTERIOR HYPOKINESIS/ EF 51%  . Transthoracic echocardiogram  08-08-2012    LVSF NORMAL/ EF [id number]  GRADE I DIASTOLIC DYSFUNCTION/ MILD AV AND MV REGURG./  RVSF MILDLY REDUCED  . Dilitation & currettage/hystroscopy with thermachoice ablation N/A 09/04/2012    Procedure: DILATATION & CURETTAGE WITH THERMACHOICE ABLATION;  Surgeon:  [name], MD;  Location: [place];  Service: Gynecology;  Laterality: N/A;  . Esophagogastroduodenoscopy N/A 05/07/2015    Procedure: ESOPHAGOGASTRODUODENOSCOPY (EGD);  Surgeon: Laurence Spates, MD;  Location: [facility];  Service: Endoscopy;  Laterality: N/A;  . Colonoscopy N/A 05/08/2015    Procedure: COLONOSCOPY;  Surgeon: Laurence Spates, MD;  Location: [place];  Service: Endoscopy;  Laterality: N/A;    Allergies  Allergies  Allergen Reactions  . Keflex [Cephalexin]     diarrhea  . Sulfa Antibiotics Hives    History of Present Illness    70 year old female with the above complex past medical history. She has a history of hypertension with previously normal LV function and low risk stress testing in March 2014. She also has a history of DVT and pulmonary embolism dating back to 2014, for which she has been on chronic Coumadin anticoagulation. In December 2016, she was admitted to [facility] secondary to melena, dyspnea, and finding of a hemoglobin of 6.2. She underwent EGD and colonoscopy revealing no evidence of acute bleeding. She was transfused 4 units of packed red blood cells and placed on PPI therapy. She was subsequently discharged and she says that following discharge, she continued to feel weak and then began to express marked dyspnea on exertion followed by significant lower extremity edema and got edema/increasing abdominal girth. She was seen in  clinic on January 30 and noted to have significant volume overload. There was also significant concern about anemia. Labs drawn that day showed normal renal function with mild hypokalemia. Her BNP was elevated at 685. Hemoglobin was 8.1 with a hematocrit of 29.8. MCV and MCH were low at 72 and 19.6 respectively. These numbers were down since hospitalization but not significantly so. She was placed on Lasix 40 mg daily and scheduled for an echocardiogram. There is also some concern that her lower extremity  swelling may be related to cellulitis and she was given a prescription for Keflex. She never ended up taking the Keflex as she remembered a history of hives when taking it in the past.   Echocardiogram was performed on February 13, and revealed new LV dysfunction with an EF of 35-40%. She also had moderate mitral regurgitation. Follow-up labs the same day continued to show stable renal function and mild hypokalemia. Since her last visit, she has lost roughly 25 pounds with improvement in lower extremity swelling and abdominal girth, though she continues to note more lower extremity edema than she has been accustomed to in the past. Her legs are no longer reddened. She continues to experience significant dyspnea on exertion but denies PND, orthopnea, dizziness, syncope, or early satiety. She has never had chest pain. She has had trouble sleeping and has requested a prescription for Ambien.  Home Medications    Prior to Admission medications   Medication Sig Start Date End Date Taking? Authorizing Provider  fluconazole (DIFLUCAN) 100 MG tablet Take 100 mg by mouth once.  06/12/15  Yes Historical Provider, MD  furosemide (LASIX) 40 MG tablet Take 1 tablet (40 mg total) by mouth 2 (two) times daily. 07/01/15  Yes Rogelia Mire, NP  metoprolol (LOPRESSOR) 50 MG tablet Take 2 tablets in the morning and 1 tablet in the evening. 08/29/14  Yes [name], MD  nitroGLYCERIN (NITROSTAT) 0.4 MG SL tablet Place 1 tablet (0.4 mg total) under the tongue every 5 (five) minutes as needed for chest pain. 06/20/14  Yes [name], MD  pantoprazole (PROTONIX) 40 MG tablet Take 1 tablet (40 mg total) by mouth 2 (two) times daily. 06/15/15  Yes Burtis Junes, NP  warfarin (COUMADIN) 3 MG tablet TAKE 1 TO 1 AND 1/2 TABLETS BY MOUTH DAILY OR AS DIRECTED Patient taking differently: Take 3 mg by mouth on Mon, Tue, Wed, Fri, and Sat. Take 4.5 mg by mouth daily on Sun and Thur. 01/13/15  Yes [name], MD    zolpidem (AMBIEN) 5 MG tablet Take 1 tablet (5 mg total) by mouth at bedtime as needed for sleep. 07/01/15  Yes Rogelia Mire, NP  enoxaparin (LOVENOX) 100 MG/ML injection Give one injection on Saturday night. Then 1 injection Sunday AM and PM. 07/01/15   Rogelia Mire, NP  lisinopril (PRINIVIL,ZESTRIL) 2.5 MG tablet Take 1 tablet (2.5 mg total) by mouth daily. 07/01/15   Rogelia Mire, NP  potassium chloride SA (K-DUR,KLOR-CON) 20 MEQ tablet Take 1 tablet (20 mEq total) by mouth 2 (two) times daily. 07/01/15   Rogelia Mire, NP    Family History    Family History  Problem Relation Age of Onset  . Coronary artery disease Father 46  . Stroke Father   . Sudden death Sister   . Hypertension Mother   . Mental illness Mother     anxiety    Social History    Social History   Social  History  . Marital Status: Married    Spouse Name: N/A  . Number of Children: N/A  . Years of Education: N/A   Occupational History  . Not on file.   Social History Main Topics  . Smoking status: Never Smoker   . Smokeless tobacco: Never Used  . Alcohol Use: No  . Drug Use: No  . Sexual Activity: Not on file   Other Topics Concern  . Not on file   Social History Narrative     Review of Systems    General:  No chills, fever, night sweats or weight changes.  Cardiovascular:  No chest pain, +++ dyspnea on exertion, +++ edema, +++ increased abd girth, though this has improved some.  No orthopnea, palpitations, paroxysmal nocturnal dyspnea. Dermatological: No rash, lesions/masses Respiratory: No cough, +++ dyspnea Urologic: No hematuria, dysuria Abdominal:   No nausea, vomiting, diarrhea, bright red blood per rectum, melena, or hematemesis Neurologic:  No visual changes, wkns, changes in mental status. All other systems reviewed and are otherwise negative except as noted above.  Physical Exam    VS:  BP 126/68 mmHg  Pulse 88  Ht 5\' 2"  (1.575 m)  Wt 211 lb (95.709 kg)   BMI 38.58 kg/m2 , BMI Body mass index is 38.58 kg/(m^2). GEN: Well nourished, well developed, in no acute distress. HEENT: normal. Neck: Supple, no JVD, carotid bruits, or masses. Cardiac: RRR, 2/6 syst murmur loudest @ upper sternal border, no rubs, or gallops. No clubbing, cyanosis.  2+ bilat LE edema to the knees.  Radials/DP/PT 2+ and equal bilaterally.  Respiratory:  Respirations regular and unlabored, clear to auscultation bilaterally. GI: Soft, nontender, nondistended, BS + x 4. MS: no deformity or atrophy. Skin: warm and dry, no rash. Neuro:  Strength and sensation are intact. Psych: Normal affect.  Accessory Clinical Findings    ECG - regular sinus rhythm, 86, left axis, LVH, lateral T-wave inversion, delayed R-wave progression, no acute changes.   Assessment & Plan   1.  Acute combined systolic and diastolic congestive heart failure/new cardiomyopathy: Patient has been experiencing worsening dyspnea exertion with significant lower extremity swelling and increasing abdominal girth since earlier this year. She was seen in January and placed on Lasix after being noted to be significantly volume overloaded. She has had significant weight loss and improvement in volume status but still feels that her lower extremity edema is greater than what she is accustomed to. Recent echo showed LV dysfunction with an EF of 35-40%. She also had moderate mitral regurgitation. She has ongoing volume overload today and I have asked her to increase her Lasix to 40 mg twice a day. I have added potassium chloride 40 mEq daily. Continue beta blocker and I will add lisinopril 2.5 mg daily. In light of new LV dysfunction, she will require an ischemic evaluation. I discussed this with Dr. [name] today and we will arrange for diagnostic catheterization for next Monday, February 20. In that setting, we will hold her Coumadin beginning today and plan to have her begin Lovenox 100 mg on Saturday evening with 2 doses on  Sunday to be followed by catheterization on Monday. She will require Lovenox bridging post catheterization as well. The patient understands that risks include but are not limited to stroke (1 in 1000), death (1 in 2), kidney failure [usually temporary] (1 in 500), bleeding (1 in 200), allergic reaction [possibly serious] (1 in 200), and agrees to proceed.    2. Hypertensive heart disease: Blood pressure is  stable on beta blocker. I'm adding low-dose ACE inhibitor as above.   3. History of pulmonary embolus and DVT: Patient is on chronic Coumadin anticoagulation. I did discuss her case with Tommy Medal, our pharmacist here in clinic. Her INR was 3.4 today. We plan to hold her Coumadin in preparation for diagnostic catheterization. We will initiate Lovenox bridging on Saturday evening with 1 dose on Saturday evening and 2 doses on Sunday. As above, she will require bridging post catheterization as well.  4. Disposition: Follow up CBC and albumin today. She had an INR already and had a basic metabolic panel 2 days ago. Plan for diagnostic catheterization on Monday. She will need a follow-up INR and basic metabolic panel that morning. Plan for office follow-up 2 weeks following catheterization.  Murray Hodgkins, NP 07/01/2015, 1:10 PM

## 2015-07-01 NOTE — Patient Instructions (Addendum)
Your physician has requested that you have a cardiac catheterization. Cardiac catheterization is used to diagnose and/or treat various heart conditions. Doctors may recommend this procedure for a number of different reasons. The most common reason is to evaluate chest pain. Chest pain can be a symptom of coronary artery disease (CAD), and cardiac catheterization can show whether plaque is narrowing or blocking your heart's arteries. This procedure is also used to evaluate the valves, as well as measure the blood flow and oxygen levels in different parts of your heart. For further information please visit [email or web address]. This will be scheduled on Monday 07/06/15 with Dr.Berry  Following your catheterization, you will not be allowed to drive for 3 days.  No lifting, pushing, or pulling greater that 10 pounds is allowed for 1 week.  You will be required to have the following tests prior to the procedure:  1. Blood work-the blood work can be done no more than 7 days prior to the procedure.  It can be done at any [facility] lab.  There is one downstairs on the first floor of this building and one in the [facility] building [phone]).     Your physician has recommended you make the following change in your medication:   1.) hold warfarin starting today. Restart on Monday unless otherwise instructed by Dr [name] after cath.  2.) the furosemide has been increased to 40 mg twice a day.  3.) start lisinopril 2.5 mg daily.  4.) a prescription for potassium has also been ordered.  5.) Lovenox injections as directed.  Your physician recommends that you schedule a follow-up appointment in: 2 weeks

## 2015-07-02 ENCOUNTER — Telehealth: Payer: Self-pay | Admitting: Cardiovascular Disease

## 2015-07-02 DIAGNOSIS — I10 Essential (primary) hypertension: Secondary | ICD-10-CM | POA: Diagnosis not present

## 2015-07-02 DIAGNOSIS — D62 Acute posthemorrhagic anemia: Secondary | ICD-10-CM | POA: Diagnosis not present

## 2015-07-02 DIAGNOSIS — M6281 Muscle weakness (generalized): Secondary | ICD-10-CM | POA: Diagnosis not present

## 2015-07-02 DIAGNOSIS — I82403 Acute embolism and thrombosis of unspecified deep veins of lower extremity, bilateral: Secondary | ICD-10-CM | POA: Diagnosis not present

## 2015-07-02 DIAGNOSIS — M170 Bilateral primary osteoarthritis of knee: Secondary | ICD-10-CM | POA: Diagnosis not present

## 2015-07-02 DIAGNOSIS — Z86711 Personal history of pulmonary embolism: Secondary | ICD-10-CM | POA: Diagnosis not present

## 2015-07-02 LAB — ALBUMIN: Albumin: 3.6 g/dL (ref 3.6–5.1)

## 2015-07-02 NOTE — Telephone Encounter (Addendum)
Courtney at Dr. Oletta Lamas office called back and states that they are able to see pt tomorrow, 07/03/15, at 10:45AM.

## 2015-07-02 NOTE — Telephone Encounter (Signed)
[facility] returned call and informed me that they are full as well tomorrow.

## 2015-07-02 NOTE — Telephone Encounter (Addendum)
Kim, RN spoke with Dr. [name] in regards to pt's labs and he ordered for pt to be set up for blood transfusion and GI evaluation.   Left message for pt to call back.  Called [facility] and [facility] and they do not have availability to do a blood transfusion tomorrow. Spoke with Baker Janus at [facility] and she has sent a message over to have someone follow up with any availability they may have for transfusions tomorrow. Called [facility] and spoke with [place] and she has sent a message to Dr. Laurence Spates in regards to getting pt in to be seen tomorrow as his schedule is full.

## 2015-07-02 NOTE — Telephone Encounter (Signed)
Spoke with pt and informed her of her lab results. Pt states that she is unable to go to the ER tonight. Pt states that she would like to go to the appt with Dr. Oletta Lamas, GI tomorrow and then she will go to ED for transfusion afterwards. Informed pt of importance of getting transfusion and that we need to get this done before her cath on Monday. Pt verbalized understanding and was in agreement with proceeding to ED after she sees Dr. Oletta Lamas, GI tomorrow.

## 2015-07-03 ENCOUNTER — Inpatient Hospital Stay (HOSPITAL_COMMUNITY)
Admission: EM | Admit: 2015-07-03 | Discharge: 2015-07-10 | DRG: 811 | Disposition: A | Discharge status: Home Health | Payer: Medicare Other | Attending: Internal Medicine | Admitting: Internal Medicine

## 2015-07-03 ENCOUNTER — Encounter (HOSPITAL_COMMUNITY): Payer: Self-pay | Admitting: Emergency Medicine

## 2015-07-03 ENCOUNTER — Observation Stay (HOSPITAL_COMMUNITY): Payer: Medicare Other

## 2015-07-03 ENCOUNTER — Telehealth: Payer: Self-pay | Admitting: *Deleted

## 2015-07-03 ENCOUNTER — Telehealth: Payer: Self-pay | Admitting: Physician Assistant

## 2015-07-03 DIAGNOSIS — Z7901 Long term (current) use of anticoagulants: Secondary | ICD-10-CM

## 2015-07-03 DIAGNOSIS — Z86718 Personal history of other venous thrombosis and embolism: Secondary | ICD-10-CM

## 2015-07-03 DIAGNOSIS — R0609 Other forms of dyspnea: Secondary | ICD-10-CM | POA: Insufficient documentation

## 2015-07-03 DIAGNOSIS — R0600 Dyspnea, unspecified: Secondary | ICD-10-CM

## 2015-07-03 DIAGNOSIS — I110 Hypertensive heart disease with heart failure: Secondary | ICD-10-CM | POA: Diagnosis present

## 2015-07-03 DIAGNOSIS — R0602 Shortness of breath: Secondary | ICD-10-CM | POA: Diagnosis not present

## 2015-07-03 DIAGNOSIS — D6489 Other specified anemias: Secondary | ICD-10-CM | POA: Diagnosis not present

## 2015-07-03 DIAGNOSIS — Z823 Family history of stroke: Secondary | ICD-10-CM

## 2015-07-03 DIAGNOSIS — D509 Iron deficiency anemia, unspecified: Secondary | ICD-10-CM | POA: Diagnosis not present

## 2015-07-03 DIAGNOSIS — Z8249 Family history of ischemic heart disease and other diseases of the circulatory system: Secondary | ICD-10-CM

## 2015-07-03 DIAGNOSIS — R000 Tachycardia, unspecified: Secondary | ICD-10-CM | POA: Diagnosis not present

## 2015-07-03 DIAGNOSIS — Z881 Allergy status to other antibiotic agents status: Secondary | ICD-10-CM

## 2015-07-03 DIAGNOSIS — Z86711 Personal history of pulmonary embolism: Secondary | ICD-10-CM

## 2015-07-03 DIAGNOSIS — D75839 Thrombocytosis, unspecified: Secondary | ICD-10-CM | POA: Diagnosis present

## 2015-07-03 DIAGNOSIS — E669 Obesity, unspecified: Secondary | ICD-10-CM | POA: Diagnosis present

## 2015-07-03 DIAGNOSIS — Z8679 Personal history of other diseases of the circulatory system: Secondary | ICD-10-CM | POA: Diagnosis not present

## 2015-07-03 DIAGNOSIS — Z6841 Body Mass Index (BMI) 40.0 and over, adult: Secondary | ICD-10-CM

## 2015-07-03 DIAGNOSIS — M170 Bilateral primary osteoarthritis of knee: Secondary | ICD-10-CM | POA: Diagnosis present

## 2015-07-03 DIAGNOSIS — I951 Orthostatic hypotension: Secondary | ICD-10-CM | POA: Diagnosis not present

## 2015-07-03 DIAGNOSIS — Z882 Allergy status to sulfonamides status: Secondary | ICD-10-CM

## 2015-07-03 DIAGNOSIS — I10 Essential (primary) hypertension: Secondary | ICD-10-CM

## 2015-07-03 DIAGNOSIS — D649 Anemia, unspecified: Secondary | ICD-10-CM | POA: Diagnosis present

## 2015-07-03 DIAGNOSIS — Z79899 Other long term (current) drug therapy: Secondary | ICD-10-CM

## 2015-07-03 DIAGNOSIS — I5042 Chronic combined systolic (congestive) and diastolic (congestive) heart failure: Secondary | ICD-10-CM | POA: Diagnosis present

## 2015-07-03 DIAGNOSIS — I5043 Acute on chronic combined systolic (congestive) and diastolic (congestive) heart failure: Secondary | ICD-10-CM | POA: Diagnosis not present

## 2015-07-03 DIAGNOSIS — D473 Essential (hemorrhagic) thrombocythemia: Secondary | ICD-10-CM | POA: Diagnosis present

## 2015-07-03 DIAGNOSIS — D7589 Other specified diseases of blood and blood-forming organs: Secondary | ICD-10-CM | POA: Diagnosis present

## 2015-07-03 DIAGNOSIS — I5022 Chronic systolic (congestive) heart failure: Secondary | ICD-10-CM | POA: Diagnosis present

## 2015-07-03 DIAGNOSIS — E876 Hypokalemia: Secondary | ICD-10-CM | POA: Diagnosis present

## 2015-07-03 LAB — COMPREHENSIVE METABOLIC PANEL
ALK PHOS: 63 U/L (ref 38–126)
ALT: 13 U/L — AB (ref 14–54)
AST: 22 U/L (ref 15–41)
Albumin: 3.3 g/dL — ABNORMAL LOW (ref 3.5–5.0)
Anion gap: 12 (ref 5–15)
BUN: 13 mg/dL (ref 6–20)
CHLORIDE: 105 mmol/L (ref 101–111)
CO2: 26 mmol/L (ref 22–32)
CREATININE: 0.66 mg/dL (ref 0.44–1.00)
Calcium: 8.9 mg/dL (ref 8.9–10.3)
GFR calc Af Amer: >60 mL/min (ref >60)
GFR calc non Af Amer: >60 mL/min (ref >60)
GLUCOSE: 101 mg/dL — AB (ref 65–99)
Potassium: 3.7 mmol/L (ref 3.5–5.1)
SODIUM: 143 mmol/L (ref 135–145)
Total Bilirubin: 1.4 mg/dL — ABNORMAL HIGH (ref 0.3–1.2)
Total Protein: 6.2 g/dL — ABNORMAL LOW (ref 6.5–8.1)

## 2015-07-03 LAB — CBC
HCT: 27 % — ABNORMAL LOW (ref 36.0–46.0)
Hemoglobin: 7 g/dL — ABNORMAL LOW (ref 12.0–15.0)
MCH: 17.9 pg — AB (ref 26.0–34.0)
MCHC: 25.9 g/dL — AB (ref 30.0–36.0)
MCV: 69.2 fL — AB (ref 78.0–100.0)
PLATELETS: 537 10*3/uL — AB (ref 150–400)
RBC: 3.9 MIL/uL (ref 3.87–5.11)
RDW: 22.1 % — AB (ref 11.5–15.5)
WBC: 6.1 10*3/uL (ref 4.0–10.5)

## 2015-07-03 LAB — IRON AND TIBC
IRON: 14 ug/dL — AB (ref 28–170)
SATURATION RATIOS: 3 % — AB (ref 10.4–31.8)
TIBC: 454 ug/dL — AB (ref 250–450)
UIBC: 440 ug/dL

## 2015-07-03 LAB — PROTIME-INR
INR: 2.46 — ABNORMAL HIGH (ref 0.00–1.49)
Prothrombin Time: 26.4 seconds — ABNORMAL HIGH (ref 11.6–15.2)

## 2015-07-03 LAB — PREPARE RBC (CROSSMATCH)

## 2015-07-03 LAB — RETICULOCYTES
RBC.: 4.06 MIL/uL (ref 3.87–5.11)
Retic Count, Absolute: 73.1 10*3/uL (ref 19.0–186.0)
Retic Ct Pct: 1.8 % (ref 0.4–3.1)

## 2015-07-03 LAB — FERRITIN: FERRITIN: 16 ng/mL (ref 11–307)

## 2015-07-03 LAB — OCCULT BLOOD, POC DEVICE: Fecal Occult Bld: NEGATIVE

## 2015-07-03 MED ORDER — ONDANSETRON HCL 4 MG PO TABS
4.0000 mg | ORAL_TABLET | Freq: Four times a day (QID) | ORAL | Status: DC | PRN
Start: 2015-07-03 — End: 2015-07-05

## 2015-07-03 MED ORDER — SODIUM CHLORIDE 0.9 % IV SOLN: INTRAVENOUS | Status: DC

## 2015-07-03 MED ORDER — MORPHINE SULFATE (PF) 2 MG/ML IV SOLN
1.0000 mg | INTRAVENOUS | Status: DC | PRN
  Filled 2015-07-03: qty 1

## 2015-07-03 MED ORDER — ZOLPIDEM TARTRATE 5 MG PO TABS
5.0000 mg | ORAL_TABLET | Freq: Every evening | ORAL | Status: DC | PRN
Start: 2015-07-03 — End: 2015-07-10
  Administered 2015-07-03 – 2015-07-09 (×7): 5 mg via ORAL
  Filled 2015-07-03 (×7): qty 1

## 2015-07-03 MED ORDER — ACETAMINOPHEN 650 MG RE SUPP: 650.0000 mg | Freq: Four times a day (QID) | RECTAL | Status: DC | PRN

## 2015-07-03 MED ORDER — SODIUM CHLORIDE 0.9% FLUSH
3.0000 mL | Freq: Two times a day (BID) | INTRAVENOUS | Status: DC
  Administered 2015-07-03 – 2015-07-10 (×13): 3 mL via INTRAVENOUS

## 2015-07-03 MED ORDER — FUROSEMIDE 40 MG PO TABS
40.0000 mg | ORAL_TABLET | Freq: Two times a day (BID) | ORAL | Status: DC
  Administered 2015-07-03 – 2015-07-04 (×2): 40 mg via ORAL
  Filled 2015-07-03 (×2): qty 1

## 2015-07-03 MED ORDER — ONDANSETRON HCL 4 MG/2ML IJ SOLN: 4.0000 mg | Freq: Four times a day (QID) | INTRAMUSCULAR | Status: DC | PRN

## 2015-07-03 MED ORDER — PANTOPRAZOLE SODIUM 40 MG IV SOLR
40.0000 mg | Freq: Two times a day (BID) | INTRAVENOUS | Status: DC
  Administered 2015-07-03 – 2015-07-07 (×9): 40 mg via INTRAVENOUS
  Filled 2015-07-03 (×9): qty 40

## 2015-07-03 MED ORDER — ACETAMINOPHEN 325 MG PO TABS: 650.0000 mg | ORAL_TABLET | Freq: Four times a day (QID) | ORAL | Status: DC | PRN

## 2015-07-03 MED ORDER — SODIUM CHLORIDE 0.9 % IV SOLN: 10.0000 mL/h | Freq: Once | INTRAVENOUS | Status: DC

## 2015-07-03 NOTE — Telephone Encounter (Signed)
Called pt and she was still not home husband unaware of where she is as she has not returned home from morning appt.

## 2015-07-03 NOTE — H&P (Signed)
[facility] History and Physical  [name] DOB: 10/31/1945 DOA: 07/03/2015  Referring physician: Dewitt Hoes PCP: [name], MD   Chief Complaint: fatigue sob  HPI: Nicole Gibbs is a very pleasant 70 y.o. female with a past medical history that includes hypertension, bilateral PE and DVT on Coumadin, CHF, symptomatic anemia, obesity, presents to the emergency department instructions of her cardiologist for hemoglobin of 7.1. Initial evaluation reveals symptomatic anemia, thrombocytosis mild hypotension.  Information is obtained from the patient. He reports being in the process of outpatient workup for cardiac cath that is scheduled for Monday. Process lab work revealed low hemoglobin she was instructed by her cardiologist to go see gastroenterologist. She saw Dr. Oletta Lamas who reportedly did a rectal exam and fecal occult was negative. and was sent to the emergency department. Of note she was in the hospital at late December for the same at which time she had an EGD and a colonoscopy that were within the limits of normal. Since she was discharged in late December she has continued to experience chronic fatigue shortness of breath with exertion some lightheadedness. She denies fever chill syncope or near-syncope. She denies chest pain palpitations abdominal pain nausea vomiting dysuria hematuria frequency or urgency. She denies NSAID use she is on Coumadin for history of bilateral PE and DVT. He denies any recent falls  In the emergency department she is afebrile hemodynamically stable and not hypoxic.  Review of Systems:  10 point review of systems complete and all systems are negative except as indicated in the history of present illness  Past Medical History  Diagnosis Date  . Thyroid nodule     no meds  . Hypertensive heart disease   . Pulmonary embolism, bilateral (HCC)     a. chronic coumadin.  . DVT, bilateral lower limbs ([place])   . Arthritis     knees  .  Postmenopausal vaginal bleeding   . Rash     under breasts  . Nocturia   . Obese   . Chronic combined systolic and diastolic CHF (congestive heart failure) ([place])     a. 07/2012 Echo: EF 55-60%, Gr1 DD;  b. 06/2015 Echo: EF 35-40%, triv AI, Mod MR, mod dil LA, mildly reduced RV.  . H/O cardiovascular stress test     a. 07/2012 MV: fixed mid-dist anterior and apical defect - scar vs attenuation, distal ant HK, no reversibility, EF 51%-->low risk.   Past Surgical History  Procedure Laterality Date  . Cesarean section  1992  . Hysteroscopy w/d&c  03-24-2009  . Dilation and curettage of uterus  06/20/2012    Procedure: DILATATION AND CURETTAGE;  Surgeon: [name], MD;  Location: [facility];  Service: Gynecology;  Laterality: N/A;  . Dilation and evacution  1988    missed ab  . Cardiovascular stress test  08-07-2012  DR HILTY/ DR BERRY    LOW RISK NUCLEAR STUDY/ NO ISCHEMIA/ FIXED MID TO DISTAL ANTERIOR AND APICAL DEFECT MAY REPRESENT SCAR VS BREAST ATTENUATION ARTIFACT/  MILD DISTAL ANTERIOR HYPOKINESIS/ EF 51%  . Transthoracic echocardiogram  08-08-2012    LVSF NORMAL/ EF [id number]  GRADE I DIASTOLIC DYSFUNCTION/ MILD AV AND MV REGURG./  RVSF MILDLY REDUCED  . Dilitation & currettage/hystroscopy with thermachoice ablation N/A 09/04/2012    Procedure: DILATATION & CURETTAGE WITH THERMACHOICE ABLATION;  Surgeon: [name], MD;  Location: [place];  Service: Gynecology;  Laterality: N/A;  . Esophagogastroduodenoscopy N/A 05/07/2015    Procedure: ESOPHAGOGASTRODUODENOSCOPY (EGD);  Surgeon: Laurence Spates, MD;  Location: [facility] ENDOSCOPY;  Service: Endoscopy;  Laterality: N/A;  . Colonoscopy N/A 05/08/2015    Procedure: COLONOSCOPY;  Surgeon: Laurence Spates, MD;  Location: [place];  Service: Endoscopy;  Laterality: N/A;   Social History:  reports that she has never smoked. She has never used smokeless tobacco. She reports that she does not drink alcohol or use illicit  drugs. Lives at home alone independent with ADLs she is retired Allergies  Allergen Reactions  . Keflex [Cephalexin] Diarrhea  . Sulfa Antibiotics Hives    Family History  Problem Relation Age of Onset  . Coronary artery disease Father 65  . Stroke Father   . Sudden death Sister   . Hypertension Mother   . Mental illness Mother     anxiety    Prior to Admission medications   Medication Sig Start Date End Date Taking? Authorizing Provider  acetaminophen (TYLENOL) 325 MG tablet Take 650 mg by mouth every 6 (six) hours as needed for moderate pain.   Yes Historical Provider, MD  enoxaparin (LOVENOX) 100 MG/ML injection Give one injection on Saturday night. Then 1 injection Sunday AM and PM. 07/01/15  Yes Rogelia Mire, NP  furosemide (LASIX) 40 MG tablet Take 1 tablet (40 mg total) by mouth 2 (two) times daily. 07/01/15  Yes Rogelia Mire, NP  lisinopril (PRINIVIL,ZESTRIL) 2.5 MG tablet Take 1 tablet (2.5 mg total) by mouth daily. 07/01/15  Yes Rogelia Mire, NP  metoprolol (LOPRESSOR) 50 MG tablet Take 2 tablets in the morning and 1 tablet in the evening. Patient taking differently: Take 50 mg by mouth daily.  08/29/14  Yes [name], MD  nitroGLYCERIN (NITROSTAT) 0.4 MG SL tablet Place 1 tablet (0.4 mg total) under the tongue every 5 (five) minutes as needed for chest pain. 06/20/14  Yes [name], MD  zolpidem (AMBIEN) 5 MG tablet Take 1 tablet (5 mg total) by mouth at bedtime as needed for sleep. 07/01/15  Yes Rogelia Mire, NP  potassium chloride SA (K-DUR,KLOR-CON) 20 MEQ tablet Take 1 tablet (20 mEq total) by mouth 2 (two) times daily. Patient not taking: Reported on 07/03/2015 07/01/15   Rogelia Mire, NP  warfarin (COUMADIN) 3 MG tablet TAKE 1 TO 1 AND 1/2 TABLETS BY MOUTH DAILY OR AS DIRECTED Patient taking differently: takes 4.5mg  only on sun, takes 3mg  all other days 01/13/15   [name], MD   Physical Exam: Filed Vitals:   07/03/15  1326 07/03/15 1544 07/03/15 1613  BP: 105/67 112/60 94/65  Pulse: 72 81 82  Temp: 97.6 F (36.4 C)  98 F (36.7 C)  TempSrc:   Oral  Resp: 16 18 24   SpO2: 97% 100% 99%    Wt Readings from Last 3 Encounters:  07/01/15 95.709 kg (211 lb)  06/15/15 102.967 kg (227 lb)  05/13/15 102.967 kg (227 lb)    General:  Appears calm and comfortable, obese, slightly pale Eyes: PERRL, normal lids, irises & conjunctiva ENT: grossly normal hearing, because membranes of her mouth pink but dry Neck: no LAD, masses or thyromegaly Cardiovascular: RRR, no m/r/g. 2-3+ LE edema.  Respiratory: CTA bilaterally, no w/r/r. Normal respiratory effort. Rest sounds slightly distant Abdomen: soft, ntnd obese soft positive bowel sounds Skin: no rash or induration seen on limited exam, very dry Musculoskeletal: grossly normal tone BUE/BLE Psychiatric: grossly normal mood and affect, speech fluent and appropriate Neurologic: grossly non-focal. speech clear facial symmetry  Labs on Admission:  Basic Metabolic Panel:  Recent Labs Lab 06/29/15 1053 07/03/15 1313  NA 140 143  K 3.5 3.7  CL 104 105  CO2 25 26  GLUCOSE 164* 101*  BUN 16 13  CREATININE 0.57 0.66  CALCIUM 8.7 8.9   Liver Function Tests:  Recent Labs Lab 07/01/15 1213 07/03/15 1313  AST  --  22  ALT  --  13*  ALKPHOS  --  63  BILITOT  --  1.4*  PROT  --  6.2*  ALBUMIN 3.6 3.3*   No results for input(s): LIPASE, AMYLASE in the last 168 hours. No results for input(s): AMMONIA in the last 168 hours. CBC:  Recent Labs Lab 07/01/15 1213 07/03/15 1313  WBC 8.0 6.1  HGB 7.1* 7.0*  HCT 26.8* 27.0*  MCV 67.7* 69.2*  PLT 612* 537*   Cardiac Enzymes: No results for input(s): CKTOTAL, CKMB, CKMBINDEX, TROPONINI in the last 168 hours.  BNP (last 3 results)  Recent Labs  06/15/15 1044  BNP 685.5*    ProBNP (last 3 results) No results for input(s): PROBNP in the last 8760 hours.  CBG: No results for input(s):  GLUCAP in the last 168 hours.  Radiological Exams on Admission: No results found.  EKg  Assessment/Plan Principal Problem:   Symptomatic anemia Active Problems:   HTN (hypertension)   Obesity, unspecified   Dyspnea on exertion   Chronic combined systolic and diastolic CHF (congestive heart failure) (HCC)   Thrombocytosis (HCC)  1. Symptomatic anemia. Etiology unclear. Hemoglobin 7.0 on admission chart review indicates hemoglobin 8.12 weeks ago and 9.2 1 month ago. Portably FOBT negative at physician's office. Hospitalized decemaber 2016 or same EGD and colonoscopy negative -Admit to telemetry -Repeat FOBT -Obtain an anemia panel -GI consult -May benefit outpatient capsule study -Transfuse 1 unit packed red blood cells -serial cbc -Likely discharge in the morning  #2. Hypertension. Blood pressure somewhat soft but stable. Home medications include Lasix and lisinopril when Lopressor. -Continue Lasix with parameters -Hold beta blocker for now -Hold Lasix for now -Monitor blood pressure closely  #3. Chronic combined systolic and diastolic heart failure. Chart review indicates EF 35%, mild LVH. I medications as noted above. He's been compliant with her Lasix and reports a 20 pound weight loss over the last week or so. -We'll continue Lasix with parameters -Monitor intake and output -Obtain daily weights -We'll hold lisinopril and beta blocker for now do to soft blood pressures -Review indicates she's scheduled for diagnostic catheterization ischemic evaluation 2/20  #4. Dyspnea on exertion. Likely related to #1 in the setting of heart failure. No crackles on pulmonary exam. Oxygen saturation level greater than 95% on room air -Continue home Lasix as noted above -We will obtain a chest x-ray -Will ambulate tomorrow evaluating history effort and oxygen saturation level  -#5. Thrombocytosis. Related to above. -Overweight anemia panel -We'll recheck in the morning  #6. His 2  bilateral PE/DVT. Patient is on Coumadin INR 2.46 -We'll hold Coumadin for now  #7. Obesity bMI greater than 35 -nutritional consult      Code Status: full DVT Prophylaxis: Family Communication: none present Disposition Plan: home likely in am Time spent: 54 minutes  [facility]

## 2015-07-03 NOTE — Telephone Encounter (Signed)
Nicole [facility]. I contacted the charge nurse there and advised that we needed some labs drawn to evaluate her anemia and asked if they could be performed while she was there. He was agreeable to this and the orders have been written.  Lenoard Aden 07/03/2015 3:37 PM Bee[facility] [phone]

## 2015-07-03 NOTE — Telephone Encounter (Signed)
error 

## 2015-07-03 NOTE — Progress Notes (Signed)
This encounter was created in error - please disregard.

## 2015-07-03 NOTE — ED Notes (Signed)
Pt was told to come to the ED for a blood transfusion, hgb was 6.1. Denies pain or active bleeding. C/o SOB, fatigue.

## 2015-07-03 NOTE — Telephone Encounter (Signed)
Left message for pt to call back and speak with Dr. Kennon Holter nurse.  Pt needs to go to [facility] for [facility] and hematology consult, need to speak with pt to make her aware and that she will go to [facility] for admission.

## 2015-07-03 NOTE — ED Provider Notes (Signed)
CSN: [id number]     Arrival date & time 07/03/15  1251 History   First MD Initiated Contact with Patient 07/03/15 1539     Chief Complaint  Patient presents with  . Abnormal Lab     (Consider location/radiation/quality/duration/timing/severity/associated sxs/prior Treatment) HPI Comments: Nicole Gibbs is a 70 y.o. 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 Alvester Chou for evaluation of her anemia. Patient had preop labs done and showed a hemoglobin of 7.1, she was told to come to the emergency room for admission and transfusion. Patient states that in December she had anemia as well, was admitted and needed 3 units transfused, at that time she had an EGD and colonoscopy by Dr. Oletta Lamas which was completely normal, she followed up with her GI specialist today and had a rectal exam and fecal occult card done which was negative. They have not yet figured out why she has anemia. Her symptoms include lightheadedness with exertion, shortness of breath with exertion, and fatigue with exertion. She states that after about 10 steps she gets symptomatic. She has been on Lasix for bilateral lower extremity swelling which has been chronic for the last 2 weeks, stable without worsening. She states she has lost approximately 20 pounds since starting Lasix.  She denies any fevers, chills, chest pain, worsening leg swelling, recent travel/surgery/immobilization, abdominal pain, nausea, vomiting, diarrhea, constipation, melena, hematochezia, dysuria, hematuria, vaginal bleeding or discharge, numbness, tingling, weakness, or syncope. She does not yet have a PCP, she has an appt with Dr. [name] at [facility] next week, to establish care, but hasn't yet been established. When she was admitted in December she was admitted under the [place] [facility].   The history is provided by the  patient and medical records. No language interpreter was used.    Past Medical History  Diagnosis Date  . Thyroid nodule     no meds  . Hypertensive heart disease   . Pulmonary embolism, bilateral (HCC)     a. chronic coumadin.  . DVT, bilateral lower limbs ([place])   . Arthritis     knees  . Postmenopausal vaginal bleeding   . Rash     under breasts  . Nocturia   . Obese   . Chronic combined systolic and diastolic CHF (congestive heart failure) ([place])     a. 07/2012 Echo: EF 55-60%, Gr1 DD;  b. 06/2015 Echo: EF 35-40%, triv AI, Mod MR, mod dil LA, mildly reduced RV.  . H/O cardiovascular stress test     a. 07/2012 MV: fixed mid-dist anterior and apical defect - scar vs attenuation, distal ant HK, no reversibility, EF 51%-->low risk.   Past Surgical History  Procedure Laterality Date  . Cesarean section  1992  . Hysteroscopy w/d&c  03-24-2009  . Dilation and curettage of uterus  06/20/2012    Procedure: DILATATION AND CURETTAGE;  Surgeon: [name], MD;  Location: [facility];  Service: Gynecology;  Laterality: N/A;  . Dilation and evacution  1988    missed ab  . Cardiovascular stress test  08-07-2012  DR HILTY/ DR BERRY    LOW RISK NUCLEAR STUDY/ NO ISCHEMIA/ FIXED MID TO DISTAL ANTERIOR AND APICAL DEFECT MAY REPRESENT SCAR VS BREAST ATTENUATION ARTIFACT/  MILD DISTAL ANTERIOR HYPOKINESIS/ EF 51%  . Transthoracic echocardiogram  08-08-2012    LVSF NORMAL/ EF 55-60%/  GRADE I  DIASTOLIC DYSFUNCTION/ MILD AV AND MV REGURG./  RVSF MILDLY REDUCED  . Dilitation & currettage/hystroscopy with thermachoice ablation N/A 09/04/2012    Procedure: DILATATION & CURETTAGE WITH THERMACHOICE ABLATION;  Surgeon: [name], MD;  Location: [facility];  Service: Gynecology;  Laterality: N/A;  . Esophagogastroduodenoscopy N/A 05/07/2015    Procedure: ESOPHAGOGASTRODUODENOSCOPY (EGD);  Surgeon: Laurence Spates, MD;  Location: [facility];  Service: Endoscopy;  Laterality: N/A;  .  Colonoscopy N/A 05/08/2015    Procedure: COLONOSCOPY;  Surgeon: Laurence Spates, MD;  Location: [facility];  Service: Endoscopy;  Laterality: N/A;   Family History  Problem Relation Age of Onset  . Coronary artery disease Father 56  . Stroke Father   . Sudden death Sister   . Hypertension Mother   . Mental illness Mother     anxiety   Social History  Substance Use Topics  . Smoking status: Never Smoker   . Smokeless tobacco: Never Used  . Alcohol Use: No   OB History    No data available     Review of Systems  Constitutional: Positive for fatigue (with exertion). Negative for fever and chills.  Respiratory: Positive for shortness of breath (with exertion).   Cardiovascular: Positive for leg swelling (chronic, stable). Negative for chest pain.  Gastrointestinal: Negative for nausea, vomiting, abdominal pain, diarrhea, constipation and blood in stool.  Genitourinary: Negative for dysuria, hematuria, vaginal bleeding and vaginal discharge.  Musculoskeletal: Negative for myalgias and arthralgias.  Skin: Negative for color change.  Allergic/Immunologic: Negative for immunocompromised state.  Neurological: Positive for light-headedness (with exertion). Negative for syncope, weakness and numbness.  Psychiatric/Behavioral: Negative for confusion.   10 Systems reviewed and are negative for acute change except as noted in the HPI.    Allergies  Keflex and Sulfa antibiotics  Home Medications   Prior to Admission medications   Medication Sig Start Date End Date Taking? Authorizing Provider  Corn Starch POWD Apply 1 application topically daily as needed (for redness).    Historical Provider, MD  enoxaparin (LOVENOX) 100 MG/ML injection Give one injection on Saturday night. Then 1 injection Sunday AM and PM. 07/01/15   Rogelia Mire, NP  furosemide (LASIX) 40 MG tablet Take 1 tablet (40 mg total) by mouth 2 (two) times daily. 07/01/15   Rogelia Mire, NP  lisinopril  (PRINIVIL,ZESTRIL) 2.5 MG tablet Take 1 tablet (2.5 mg total) by mouth daily. Patient not taking: Reported on 07/02/2015 07/01/15   Rogelia Mire, NP  metoprolol (LOPRESSOR) 50 MG tablet Take 2 tablets in the morning and 1 tablet in the evening. Patient taking differently: Take 50 mg by mouth daily.  08/29/14   [name], MD  nitroGLYCERIN (NITROSTAT) 0.4 MG SL tablet Place 1 tablet (0.4 mg total) under the tongue every 5 (five) minutes as needed for chest pain. 06/20/14   [name], MD  pantoprazole (PROTONIX) 40 MG tablet Take 1 tablet (40 mg total) by mouth 2 (two) times daily. Patient not taking: Reported on 07/02/2015 06/15/15   Burtis Junes, NP  potassium chloride SA (K-DUR,KLOR-CON) 20 MEQ tablet Take 1 tablet (20 mEq total) by mouth 2 (two) times daily. Patient taking differently: Take 20 mEq by mouth daily.  07/01/15   Rogelia Mire, NP  warfarin (COUMADIN) 3 MG tablet TAKE 1 TO 1 AND 1/2 TABLETS BY MOUTH DAILY OR AS DIRECTED Patient taking differently: Take 3 mg by mouth on Mon, Tue, Wed, Fri, and Sat. Take 4.5 mg  by mouth daily on Sun and Thur. 01/13/15   [name], MD  zolpidem (AMBIEN) 5 MG tablet Take 1 tablet (5 mg total) by mouth at bedtime as needed for sleep. 07/01/15   Rogelia Mire, NP   BP 105/67 mmHg  Pulse 72  Temp(Src) 97.6 F (36.4 C)  Resp 16  SpO2 97% Physical Exam  Constitutional: She is oriented to person, place, and time. Vital signs are normal. She appears well-developed and well-nourished.  Non-toxic appearance. No distress.  Afebrile, nontoxic, NAD  HENT:  Head: Normocephalic and atraumatic.  Mouth/Throat: Oropharynx is clear and moist and mucous membranes are normal.  Eyes: Conjunctivae and EOM are normal. Right eye exhibits no discharge. Left eye exhibits no discharge.  Conjunctival pallor  Neck: Normal range of motion. Neck supple.  Cardiovascular: Normal rate, regular rhythm, normal heart sounds and intact distal pulses.   Exam reveals no gallop and no friction rub.   No murmur heard. RRR, nl s1/s2, no m/r/g, distal pulses intact, 1-2+ b/l pedal edema which pt states is baseline  Pulmonary/Chest: Effort normal and breath sounds normal. No respiratory distress. She has no decreased breath sounds. She has no wheezes. She has no rhonchi. She has no rales.  CTAB in all lung fields, no w/r/r, no hypoxia or increased WOB, speaking in full sentences, SpO2 100% on RA   Abdominal: Soft. Normal appearance and bowel sounds are normal. She exhibits no distension. There is no tenderness. There is no rigidity, no rebound, no guarding, no CVA tenderness, no tenderness at McBurney's point and negative Murphy's sign.  Musculoskeletal: Normal range of motion.  Neurological: She is alert and oriented to person, place, and time. She has normal strength. No sensory deficit.  Skin: Skin is warm, dry and intact. No rash noted. There is pallor.  Pale skin  Psychiatric: She has a normal mood and affect.  Nursing note and vitals reviewed.   ED Course  Procedures (including critical care time)  CRITICAL CARE- symptomatic anemia requiring transfusion Performed by: Corine Shelter   Total critical care time: 45 minutes  Critical care time was exclusive of separately billable procedures and treating other patients.  Critical care was necessary to treat or prevent imminent or life-threatening deterioration.  Critical care was time spent personally by me on the following activities: development of treatment plan with patient and/or surrogate as well as nursing, discussions with consultants, evaluation of patient's response to treatment, examination of patient, obtaining history from patient or surrogate, ordering and performing treatments and interventions, ordering and review of laboratory studies, ordering and review of radiographic studies, pulse oximetry and re-evaluation of patient's condition.  Labs Review Labs  Reviewed  COMPREHENSIVE METABOLIC PANEL - Abnormal; Notable for the following:    Glucose, Bld 101 (*)    Total Protein 6.2 (*)    Albumin 3.3 (*)    ALT 13 (*)    Total Bilirubin 1.4 (*)    All other components within normal limits  CBC - Abnormal; Notable for the following:    Hemoglobin 7.0 (*)    HCT 27.0 (*)    MCV 69.2 (*)    MCH 17.9 (*)    MCHC 25.9 (*)    RDW 22.1 (*)    Platelets 537 (*)    All other components within normal limits  PROTIME-INR - Abnormal; Notable for the following:    Prothrombin Time 26.4 (*)    INR 2.46 (*)    All other components within normal  limits  IRON AND TIBC - Abnormal; Notable for the following:    Iron 14 (*)    TIBC 454 (*)    Saturation Ratios 3 (*)    All other components within normal limits  FERRITIN  RETICULOCYTES  HAPTOGLOBIN  BASIC METABOLIC PANEL  CBC  TYPE AND SCREEN  PREPARE RBC (CROSSMATCH)    Imaging Review Dg Chest Portable 1 View  07/03/2015  CLINICAL DATA:  Anemia.  Shortness of breath, fatigue. EXAM: PORTABLE CHEST 1 VIEW COMPARISON:  05/09/2015 FINDINGS: There is cardiac enlargement. Pulmonary vascular congestion is noted. No pleural effusion or edema. No airspace consolidation identified. IMPRESSION: 1. Cardiac enlargement and pulmonary vascular congestion. Electronically Signed   By: Kerby Moors M.D.   On: 07/03/2015 17:15   I have personally reviewed and evaluated these images and lab results as part of my medical decision-making.   EKG Interpretation None      MDM   Final diagnoses:  Symptomatic anemia  Dyspnea on exertion    70 y.o. female here due to her anemia, she was admitted back in December for symptomatic anemia, had 3 units transfused, has had a full GI work up with EGD and colonoscopy which was neg. Has FOBT today in their office which again was negative. No obvious source found yet. She had preop labs for her cardiologist Dr. [name] (she is scheduled for a cath) and they noted her Hgb was  down to 7.1 there. Here it's 7.0. MCV low at 69.2 which likely indicates iron deficiency anemia. Heme panel for anemia ordered by nursing staff already. INR therapeutic at 2.49. She is symptomatic here, has SOB fatigue and lightheadedness with exertion, states she gets approx 10 steps before becoming symptomatic. Has LE swelling which has been ongoing x2wks, stable, taking lasix. No tachycardia or hypoxia, doubt new PE or DVT. Symptoms likely from anemia. Type and screen done, will get ABO/Rh. CMP with stable bili 1.4 and otherwise unremarkable/unconcerning. Will order for transfusion to get started on process, but will consult for admission. Pt hasn't yet established primary care with Dr. [name] yet, has appt next week, therefore she will be unassigned for now. Will reassess shortly  4:36 PM Dyanne Carrel NP for triad returning page, requests that I call GI in consultation now, and will admit to tele bed. Holding orders placed. Will await return call from GI.  6:01 PM Still haven't heard back from GI but admitting team already down to see pt and has placed admission orders. They will need to f/up on GI consult during her admission as I was not able to get ahold of them. Of note, retics WNL, ferritin WNL, TIBC high and Iron low with low sat ratio. Admitting team got portable CXR which showed cardiomegaly with pulm vascular congestion, likely related to her CHF. Please see admission notes for further documentation of care. Pt stable at this time.  BP 117/81 mmHg  Pulse 89  Temp(Src) 98 F (36.7 C) (Oral)  Resp 20  SpO2 96%  Meds ordered this encounter  Medications  . 0.9 %  sodium chloride infusion    Sig:   . acetaminophen (TYLENOL) 325 MG tablet    Sig: Take 650 mg by mouth every 6 (six) hours as needed for moderate pain.  Marland Kitchen zolpidem (AMBIEN) tablet 5 mg    Sig:   . sodium chloride flush (NS) 0.9 % injection 3 mL    Sig:   . DISCONTD: 0.9 %  sodium chloride infusion  Sig:   .  acetaminophen (TYLENOL) tablet 650 mg    Sig:    Or  . acetaminophen (TYLENOL) suppository 650 mg    Sig:   . morphine 2 MG/ML injection 1 mg    Sig:   . ondansetron (ZOFRAN) tablet 4 mg    Sig:    Or  . ondansetron (ZOFRAN) injection 4 mg    Sig:   . pantoprazole (PROTONIX) injection 40 mg    Sig:   . furosemide (LASIX) tablet 40 mg    Sig:      Brooklyne Radke Camprubi-Soms, PA-C 07/03/15 1802  Virgel Manifold, MD 07/04/15 [phone]

## 2015-07-04 ENCOUNTER — Other Ambulatory Visit: Payer: Self-pay

## 2015-07-04 DIAGNOSIS — I5042 Chronic combined systolic (congestive) and diastolic (congestive) heart failure: Secondary | ICD-10-CM | POA: Diagnosis not present

## 2015-07-04 DIAGNOSIS — I10 Essential (primary) hypertension: Secondary | ICD-10-CM | POA: Diagnosis not present

## 2015-07-04 DIAGNOSIS — D473 Essential (hemorrhagic) thrombocythemia: Secondary | ICD-10-CM | POA: Diagnosis not present

## 2015-07-04 DIAGNOSIS — D649 Anemia, unspecified: Secondary | ICD-10-CM | POA: Diagnosis not present

## 2015-07-04 DIAGNOSIS — D509 Iron deficiency anemia, unspecified: Secondary | ICD-10-CM | POA: Diagnosis not present

## 2015-07-04 DIAGNOSIS — I110 Hypertensive heart disease with heart failure: Secondary | ICD-10-CM | POA: Diagnosis not present

## 2015-07-04 DIAGNOSIS — Z6841 Body Mass Index (BMI) 40.0 and over, adult: Secondary | ICD-10-CM | POA: Diagnosis not present

## 2015-07-04 DIAGNOSIS — I5043 Acute on chronic combined systolic (congestive) and diastolic (congestive) heart failure: Secondary | ICD-10-CM | POA: Diagnosis not present

## 2015-07-04 LAB — PROTIME-INR
INR: 2.22 — AB (ref 0.00–1.49)
PROTHROMBIN TIME: 24.4 s — AB (ref 11.6–15.2)

## 2015-07-04 LAB — CBC
HEMATOCRIT: 27.6 % — AB (ref 36.0–46.0)
HEMOGLOBIN: 7.5 g/dL — AB (ref 12.0–15.0)
MCH: 19.1 pg — AB (ref 26.0–34.0)
MCHC: 27.2 g/dL — AB (ref 30.0–36.0)
MCV: 70.4 fL — AB (ref 78.0–100.0)
Platelets: 466 10*3/uL — ABNORMAL HIGH (ref 150–400)
RBC: 3.92 MIL/uL (ref 3.87–5.11)
RDW: 22.8 % — ABNORMAL HIGH (ref 11.5–15.5)
WBC: 6.7 10*3/uL (ref 4.0–10.5)

## 2015-07-04 LAB — BASIC METABOLIC PANEL
ANION GAP: 12 (ref 5–15)
BUN: 13 mg/dL (ref 6–20)
CHLORIDE: 105 mmol/L (ref 101–111)
CO2: 26 mmol/L (ref 22–32)
Calcium: 8.6 mg/dL — ABNORMAL LOW (ref 8.9–10.3)
Creatinine, Ser: 0.76 mg/dL (ref 0.44–1.00)
GFR calc non Af Amer: >60 mL/min (ref >60)
GLUCOSE: 88 mg/dL (ref 65–99)
Potassium: 3.3 mmol/L — ABNORMAL LOW (ref 3.5–5.1)
SODIUM: 143 mmol/L (ref 135–145)

## 2015-07-04 LAB — PREPARE RBC (CROSSMATCH)

## 2015-07-04 LAB — HAPTOGLOBIN: HAPTOGLOBIN: 176 mg/dL (ref 34–200)

## 2015-07-04 MED ORDER — SODIUM CHLORIDE 0.9 % IV SOLN
1250.0000 mg | Freq: Once | INTRAVENOUS | Status: AC
  Administered 2015-07-04: 1250 mg via INTRAVENOUS
  Filled 2015-07-04 (×2): qty 25

## 2015-07-04 MED ORDER — METOPROLOL TARTRATE 50 MG PO TABS
50.0000 mg | ORAL_TABLET | Freq: Every day | ORAL | Status: DC
  Administered 2015-07-04 – 2015-07-05 (×2): 50 mg via ORAL
  Filled 2015-07-04 (×2): qty 1

## 2015-07-04 MED ORDER — SODIUM CHLORIDE 0.9 % IV SOLN
25.0000 mg | Freq: Once | INTRAVENOUS | Status: DC
  Filled 2015-07-04: qty 0.5

## 2015-07-04 MED ORDER — WARFARIN - PHARMACIST DOSING INPATIENT
Freq: Every day | Status: DC
  Administered 2015-07-06 – 2015-07-07 (×2)

## 2015-07-04 MED ORDER — SODIUM CHLORIDE 0.9 % IV SOLN
1250.0000 mg | Freq: Once | INTRAVENOUS | Status: DC
  Filled 2015-07-04: qty 25

## 2015-07-04 MED ORDER — SODIUM CHLORIDE 0.9 % IV SOLN
25.0000 mg | Freq: Once | INTRAVENOUS | Status: AC
  Administered 2015-07-04: 25 mg via INTRAVENOUS
  Filled 2015-07-04: qty 0.5

## 2015-07-04 MED ORDER — LISINOPRIL 2.5 MG PO TABS
2.5000 mg | ORAL_TABLET | Freq: Every day | ORAL | Status: DC
  Administered 2015-07-04 – 2015-07-09 (×6): 2.5 mg via ORAL
  Filled 2015-07-04 (×6): qty 1

## 2015-07-04 MED ORDER — POTASSIUM CHLORIDE CRYS ER 20 MEQ PO TBCR
20.0000 meq | EXTENDED_RELEASE_TABLET | ORAL | Status: AC
  Administered 2015-07-04 (×2): 20 meq via ORAL
  Filled 2015-07-04: qty 1

## 2015-07-04 MED ORDER — FUROSEMIDE 40 MG PO TABS: 40.0000 mg | ORAL_TABLET | Freq: Two times a day (BID) | ORAL | Status: DC

## 2015-07-04 MED ORDER — WARFARIN SODIUM 2 MG PO TABS
3.0000 mg | ORAL_TABLET | Freq: Once | ORAL | Status: AC
  Administered 2015-07-04: 3 mg via ORAL
  Filled 2015-07-04: qty 1

## 2015-07-04 MED ORDER — ACETAMINOPHEN 325 MG PO TABS
650.0000 mg | ORAL_TABLET | Freq: Once | ORAL | Status: AC
  Administered 2015-07-04: 650 mg via ORAL
  Filled 2015-07-04: qty 2

## 2015-07-04 MED ORDER — SODIUM CHLORIDE 0.9 % IV SOLN
Freq: Once | INTRAVENOUS | Status: AC
  Administered 2015-07-04: 12:00:00 via INTRAVENOUS

## 2015-07-04 MED ORDER — POTASSIUM CHLORIDE CRYS ER 20 MEQ PO TBCR: 20.0000 meq | EXTENDED_RELEASE_TABLET | Freq: Two times a day (BID) | ORAL | Status: DC

## 2015-07-04 MED ORDER — POTASSIUM CHLORIDE CRYS ER 20 MEQ PO TBCR
20.0000 meq | EXTENDED_RELEASE_TABLET | Freq: Two times a day (BID) | ORAL | Status: DC
  Filled 2015-07-04: qty 1

## 2015-07-04 MED ORDER — DIPHENHYDRAMINE HCL 50 MG/ML IJ SOLN
25.0000 mg | Freq: Once | INTRAMUSCULAR | Status: AC
  Administered 2015-07-04: 25 mg via INTRAVENOUS
  Filled 2015-07-04: qty 1

## 2015-07-04 MED ORDER — FUROSEMIDE 10 MG/ML IJ SOLN
40.0000 mg | Freq: Once | INTRAMUSCULAR | Status: AC
  Administered 2015-07-04: 40 mg via INTRAVENOUS
  Filled 2015-07-04: qty 4

## 2015-07-04 NOTE — Progress Notes (Signed)
Patient's HR increases to 130's-140's but comes back down with no treatment or intervention.  Dr.  Sloan Leiter notified.  Will continue to monitor.

## 2015-07-04 NOTE — Progress Notes (Signed)
ANTICOAGULATION CONSULT NOTE - Initial Consult  Pharmacy Consult for coumadin  Indication: hx PE and DVT  Allergies  Allergen Reactions  . Keflex [Cephalexin] Diarrhea  . Sulfa Antibiotics Hives    Patient Measurements: Height: 5\' 2"  (157.5 cm) Weight: 202 lb 11.2 oz (91.944 kg) IBW/kg (Calculated) : 50.1  Vital Signs: Temp: 98.2 F (36.8 C) (02/18 1130) Temp Source: Oral (02/18 1130) BP: 117/79 mmHg (02/18 1130) Pulse Rate: 76 (02/18 1130)  Labs:  Recent Labs  07/01/15 1213 07/03/15 1313 07/04/15 0445 07/04/15 1032  HGB 7.1* 7.0* 7.5*  --   HCT 26.8* 27.0* 27.6*  --   PLT 612* 537* 466*  --   LABPROT  --  26.4*  --  24.4*  INR  --  2.46*  --  2.22*  CREATININE  --  0.66 0.76  --     Estimated Creatinine Clearance: 72 mL/min (by C-G formula based on Cr of 0.76).   Medical History: Past Medical History  Diagnosis Date  . Thyroid nodule     no meds  . Hypertensive heart disease   . Pulmonary embolism, bilateral (HCC)     a. chronic coumadin.  . DVT, bilateral lower limbs ([place])   . Arthritis     knees  . Postmenopausal vaginal bleeding   . Rash     under breasts  . Nocturia   . Obese   . Chronic combined systolic and diastolic CHF (congestive heart failure) ([place])     a. 07/2012 Echo: EF 55-60%, Gr1 DD;  b. 06/2015 Echo: EF 35-40%, triv AI, Mod MR, mod dil LA, mildly reduced RV.  . H/O cardiovascular stress test     a. 07/2012 MV: fixed mid-dist anterior and apical defect - scar vs attenuation, distal ant HK, no reversibility, EF 51%-->low risk.    Medications:  Prescriptions prior to admission  Medication Sig Dispense Refill Last Dose  . acetaminophen (TYLENOL) 325 MG tablet Take 650 mg by mouth every 6 (six) hours as needed for moderate pain.   Past Month at Unknown time  . enoxaparin (LOVENOX) 100 MG/ML injection Give one injection on Saturday night. Then 1 injection Sunday AM and PM. 3 Syringe 0 not started  . furosemide (LASIX) 40 MG tablet Take 1  tablet (40 mg total) by mouth 2 (two) times daily. 60 tablet 6 07/02/2015 at Unknown time  . lisinopril (PRINIVIL,ZESTRIL) 2.5 MG tablet Take 1 tablet (2.5 mg total) by mouth daily. 30 tablet 6 07/02/2015 at Unknown time  . metoprolol (LOPRESSOR) 50 MG tablet Take 2 tablets in the morning and 1 tablet in the evening. (Patient taking differently: Take 50 mg by mouth daily. ) 270 tablet 1 07/03/2015 at 1000  . nitroGLYCERIN (NITROSTAT) 0.4 MG SL tablet Place 1 tablet (0.4 mg total) under the tongue every 5 (five) minutes as needed for chest pain. 25 tablet 3 not used  . zolpidem (AMBIEN) 5 MG tablet Take 1 tablet (5 mg total) by mouth at bedtime as needed for sleep. 30 tablet 0 07/02/2015 at Unknown time  . potassium chloride SA (K-DUR,KLOR-CON) 20 MEQ tablet Take 1 tablet (20 mEq total) by mouth 2 (two) times daily. (Patient not taking: Reported on 07/03/2015) 60 tablet 6 Not Taking at Unknown time  . warfarin (COUMADIN) 3 MG tablet TAKE 1 TO 1 AND 1/2 TABLETS BY MOUTH DAILY OR AS DIRECTED (Patient taking differently: takes 4.5mg  only on sun, takes 3mg  all other days) 120 tablet 1 on hold   Scheduled:  .  sodium chloride  10 mL/hr Intravenous Once  . sodium chloride   Intravenous Once  . furosemide  40 mg Intravenous Once  . [START ON 07/05/2015] furosemide  40 mg Oral BID  . iron dextran (INFED/DEXFERRUM) infusion  25 mg Intravenous Once   Followed by  . iron dextran (INFED/DEXFERRUM) infusion  1,250 mg Intravenous Once  . lisinopril  2.5 mg Oral Daily  . metoprolol  50 mg Oral Daily  . pantoprazole (PROTONIX) IV  40 mg Intravenous Q12H  . [START ON 07/05/2015] potassium chloride SA  20 mEq Oral BID  . sodium chloride flush  3 mL Intravenous Q12H    Assessment: 70 yo female here with anemia on coumadin PTA for hx PE and DVT. Coumadin was on hold as outpatient with plans for cath on Monday but cath plans have been cancelled due to anemia. No bleeding noted.  Home coumadin dose: 4.5 mg on Sunday, 3  mg all other days (last clinic visit 2/15)  Pharmacy also consulted for IV iron. Hg= 7.5 -Iron 14, TIBC 454, sat 3, ferritin 16.   Goal of Therapy:  INR 2-3 Monitor platelets by anticoagulation protocol: Yes   Plan:  -Coumadin 3mg  po today -Daily PT/INR -Iron dextran test dose then 1250mg  IV if test dose is tolerated  Hildred Laser, Pharm D 07/04/2015 11:59 AM

## 2015-07-04 NOTE — Progress Notes (Signed)
PATIENT DETAILS Name: Nicole Gibbs Age: 70 y.o. Sex: female Date of Birth: [birth year] Admit Date: 07/03/2015 Admitting Physician Delfina Redwood, MD [id number] L, MD   Brief narrative:  70 year old female with newly diagnosed chronic systolic heart failure-history on chronic anticoagulation for large unprovoked VTE 2014-has been having ongoing microcytic anemia for the past few months. She had a negative EGD/colonoscopy in December 2016. She was admitted on 2/17 for exertional dyspnea with a hemoglobin level of 7.0.  Subjective: Not short of breath at rest-but still short of breath upon ambulating a few feet-and walking to the bathroom from her bed.  Assessment/Plan: Principal Problem: Symptomatic anemia: Hemoglobin still 7.5 after 1 unit of transfusion on admission. Still very symptomatic-exertional dyspnea on minimal activity. Iron panels indicate iron deficiency. Spoke with [facility]-Dr. Keene Breath patient had a negative recent EGD/colonoscopy (December 2016)-he does not advise any further endoscopic procedures while inpatient, he advises that patient needs to follow with [facility]ology for an outpatient capsule endoscopy. FOBT negative as well. Will transfuse another unit of PRBC today, provide IV iron. Ambulate with physical therapy. Follow.  Active Problems: Shortness of breath: Secondary to symptomatic anemia and chronic systolic heart failure.  Chronic systolic heart failure: Newly diagnosed-has had cardiology follow-up as outpatient-plans were for a elective cardiac catheterization on 2/20-given severity of anemia-the fact that patient needs lifelong anticoagulation for unprovoked large DVT/PE (2014)-suspect catheterization will need to be postponed/canceled for the immediate future. Continue Lasix, metoprolol and low-dose enalapril. Spoke with Ramond Dial seen the patient in the clinic-they will coordinate with GI and  continue to follow  Hypokalemia: Replete and recheck.  History of DVT/PE 2014: Per patient she has been advised by prior physicians that she needs lifelong anticoagulation. Suspect this was unprovoked venous thromboembolism with a large clot burden. Resume Coumadin-no evidence of active bleeding at this time. Will ask pharmacy to dose while inpatient. Patient will need follow-up with hematology as an outpatient.  Thrombocytosis: Likely secondary to iron deficiency anemia.  Disposition: Remain inpatient-home 2/19 if CBC stable.  Antimicrobial agents  See below  Anti-infectives    None      DVT Prophylaxis: Coumadin  Code Status: Full code   Family Communication None at bedside  Procedures: None  CONSULTS:  None  Time spent 25 minutes-Greater than 50% of this time was spent in counseling, explanation of diagnosis, planning of further management, and coordination of care.  MEDICATIONS: Scheduled Meds: . sodium chloride  10 mL/hr Intravenous Once  . sodium chloride   Intravenous Once  . acetaminophen  650 mg Oral Once  . diphenhydrAMINE  25 mg Intravenous Once  . furosemide  40 mg Oral BID  . iron dextran (INFED/DEXFERRUM) infusion  25 mg Intravenous Once   Followed by  . iron dextran (INFED/DEXFERRUM) infusion  1,250 mg Intravenous Once  . lisinopril  2.5 mg Oral Daily  . metoprolol  50 mg Oral Daily  . pantoprazole (PROTONIX) IV  40 mg Intravenous Q12H  . [START ON 07/05/2015] potassium chloride SA  20 mEq Oral BID  . sodium chloride flush  3 mL Intravenous Q12H   Continuous Infusions:  PRN Meds:.acetaminophen **OR** acetaminophen, morphine injection, ondansetron **OR** ondansetron (ZOFRAN) IV, zolpidem    PHYSICAL EXAM: Vital signs in last 24 hours: Filed Vitals:   07/04/15 0549 07/04/15 0737 07/04/15 0926 07/04/15 1004  BP: 124/72 116/70 126/70 126/70  Pulse: 87 84 86   Temp: 99.3  F (37.4 C) 98.4 F (36.9 C)    TempSrc: Oral Oral    Resp: 20 20 18     Height:      Weight: 91.944 kg (202 lb 11.2 oz)     SpO2: 94% 92% 95%     Weight change:  Filed Weights   07/03/15 1950 07/04/15 0549  Weight: 92.715 kg (204 lb 6.4 oz) 91.944 kg (202 lb 11.2 oz)   Body mass index is 37.06 kg/(m^2).   Gen Exam: Awake and alert with clear speech.   Neck: Supple, No JVD.   Chest: B/L Clear.   CVS: S1 S2 Regular, no murmurs.  Abdomen: soft, BS +, non tender, non distended.  Extremities: + edema, lower extremities warm to touch. Neurologic: Non Focal.   Skin: No Rash.   Wounds: N/A.   Intake/Output from previous day:  Intake/Output Summary (Last 24 hours) at 07/04/15 1038 Last data filed at 07/04/15 0604  Gross per 24 hour  Intake   1055 ml  Output   1025 ml  Net     30 ml     LAB RESULTS: CBC  Recent Labs Lab 07/01/15 1213 07/03/15 1313 07/04/15 0445  WBC 8.0 6.1 6.7  HGB 7.1* 7.0* 7.5*  HCT 26.8* 27.0* 27.6*  PLT 612* 537* 466*  MCV 67.7* 69.2* 70.4*  MCH 17.9* 17.9* 19.1*  MCHC 26.5* 25.9* 27.2*  RDW 22.4* 22.1* 22.8*    Chemistries   Recent Labs Lab 06/29/15 1053 07/03/15 1313 07/04/15 0445  NA 140 143 143  K 3.5 3.7 3.3*  CL 104 105 105  CO2 25 26 26   GLUCOSE 164* 101* 88  BUN 16 13 13   CREATININE 0.57 0.66 0.76  CALCIUM 8.7 8.9 8.6*    CBG: No results for input(s): GLUCAP in the last 168 hours.  GFR Estimated Creatinine Clearance: 72 mL/min (by C-G formula based on Cr of 0.76).  Coagulation profile  Recent Labs Lab 07/01/15 1046 07/03/15 1313  INR 3.4 2.46*    Cardiac Enzymes No results for input(s): CKMB, TROPONINI, MYOGLOBIN in the last 168 hours.  Invalid input(s): CK  Invalid input(s): POCBNP No results for input(s): DDIMER in the last 72 hours. No results for input(s): HGBA1C in the last 72 hours. No results for input(s): CHOL, HDL, LDLCALC, TRIG, CHOLHDL, LDLDIRECT in the last 72 hours. No results for input(s): TSH, T4TOTAL, T3FREE, THYROIDAB in the last 72 hours.  Invalid  input(s): FREET3  Recent Labs  07/03/15 1558  FERRITIN 16  TIBC 454*  IRON 14*  RETICCTPCT 1.8   No results for input(s): LIPASE, AMYLASE in the last 72 hours.  Urine Studies No results for input(s): UHGB, CRYS in the last 72 hours.  Invalid input(s): UACOL, UAPR, USPG, UPH, UTP, UGL, UKET, UBIL, UNIT, UROB, ULEU, UEPI, UWBC, URBC, UBAC, CAST, UCOM, BILUA  MICROBIOLOGY: No results found for this or any previous visit (from the past 240 hour(s)).  RADIOLOGY STUDIES/RESULTS: Dg Chest Portable 1 View  07/03/2015  CLINICAL DATA:  Anemia.  Shortness of breath, fatigue. EXAM: PORTABLE CHEST 1 VIEW COMPARISON:  05/09/2015 FINDINGS: There is cardiac enlargement. Pulmonary vascular congestion is noted. No pleural effusion or edema. No airspace consolidation identified. IMPRESSION: 1. Cardiac enlargement and pulmonary vascular congestion. Electronically Signed   By: Kerby Moors M.D.   On: 07/03/2015 17:15    Oren Binet, MD  [facility] Pager:[phone]  If 7PM-7AM, please contact night-coverage [email or web address] Password [email or web address] 07/04/2015, 10:38 AM   LOS: 1 day  [id number]  

## 2015-07-04 NOTE — Progress Notes (Signed)
PT Sign-off Note  Patient Details Name: Nicole Gibbs MRN: [id number] DOB: July 01, 1945   Cancelled Treatment:    Reason Eval/Treat Not Completed: Other (comment)  Consult received. When PT arrived to talk with pt she stated she did not feel she needed PT in the hospital. She was sitting on the side of the bed and reported that she had been getting up in the room and feels she is at her baseline. She reports that she is receiving outpt PT for her wrist and for her breathing and she did not see why she needed PT in the hospital.  Encouraged pt to get up and walk in hallways with nursing staff while she is here to prevent deconditioning.  Pt very pleasant throughout interaction.  PT to sign-off.     Melvern Banker 07/04/2015, 4:12 PM  Lavonia Dana, [place] 07/04/2015

## 2015-07-04 NOTE — Progress Notes (Signed)
Patient had five beats V-tach at 23:04. Patient is asymptomatic. Will continue to monitor.

## 2015-07-05 ENCOUNTER — Observation Stay (HOSPITAL_COMMUNITY): Payer: Medicare Other

## 2015-07-05 ENCOUNTER — Other Ambulatory Visit: Payer: Self-pay

## 2015-07-05 DIAGNOSIS — I5042 Chronic combined systolic (congestive) and diastolic (congestive) heart failure: Secondary | ICD-10-CM

## 2015-07-05 DIAGNOSIS — I5043 Acute on chronic combined systolic (congestive) and diastolic (congestive) heart failure: Secondary | ICD-10-CM | POA: Diagnosis not present

## 2015-07-05 DIAGNOSIS — D509 Iron deficiency anemia, unspecified: Secondary | ICD-10-CM | POA: Diagnosis not present

## 2015-07-05 DIAGNOSIS — Z6841 Body Mass Index (BMI) 40.0 and over, adult: Secondary | ICD-10-CM | POA: Diagnosis not present

## 2015-07-05 DIAGNOSIS — I110 Hypertensive heart disease with heart failure: Secondary | ICD-10-CM | POA: Diagnosis not present

## 2015-07-05 DIAGNOSIS — I509 Heart failure, unspecified: Secondary | ICD-10-CM | POA: Diagnosis not present

## 2015-07-05 DIAGNOSIS — D649 Anemia, unspecified: Secondary | ICD-10-CM | POA: Diagnosis not present

## 2015-07-05 LAB — TYPE AND SCREEN
ABO/RH(D): O POS
ANTIBODY SCREEN: NEGATIVE
UNIT DIVISION: 0
Unit division: 0

## 2015-07-05 LAB — BASIC METABOLIC PANEL
Anion gap: 10 (ref 5–15)
BUN: 14 mg/dL (ref 6–20)
CALCIUM: 8.7 mg/dL — AB (ref 8.9–10.3)
CHLORIDE: 108 mmol/L (ref 101–111)
CO2: 27 mmol/L (ref 22–32)
CREATININE: 0.91 mg/dL (ref 0.44–1.00)
GFR calc non Af Amer: >60 mL/min (ref >60)
GLUCOSE: 93 mg/dL (ref 65–99)
Potassium: 3.3 mmol/L — ABNORMAL LOW (ref 3.5–5.1)
Sodium: 145 mmol/L (ref 135–145)

## 2015-07-05 LAB — CBC
HEMATOCRIT: 32.7 % — AB (ref 36.0–46.0)
HEMOGLOBIN: 9 g/dL — AB (ref 12.0–15.0)
MCH: 19.5 pg — AB (ref 26.0–34.0)
MCHC: 27.5 g/dL — AB (ref 30.0–36.0)
MCV: 70.9 fL — AB (ref 78.0–100.0)
Platelets: 475 10*3/uL — ABNORMAL HIGH (ref 150–400)
RBC: 4.61 MIL/uL (ref 3.87–5.11)
RDW: 22.4 % — AB (ref 11.5–15.5)
WBC: 7.1 10*3/uL (ref 4.0–10.5)

## 2015-07-05 LAB — RETICULOCYTES
RBC.: 4.61 MIL/uL (ref 3.87–5.11)
RETIC CT PCT: 1.9 % (ref 0.4–3.1)
Retic Count, Absolute: 87.6 10*3/uL (ref 19.0–186.0)

## 2015-07-05 LAB — VITAMIN B12: Vitamin B-12: 338 pg/mL (ref 180–914)

## 2015-07-05 LAB — PROTIME-INR
INR: 2.05 — AB (ref 0.00–1.49)
PROTHROMBIN TIME: 23 s — AB (ref 11.6–15.2)

## 2015-07-05 LAB — BRAIN NATRIURETIC PEPTIDE: B NATRIURETIC PEPTIDE 5: 811.5 pg/mL — AB (ref 0.0–100.0)

## 2015-07-05 MED ORDER — SODIUM CHLORIDE 0.9 % IV BOLUS (SEPSIS): 500.0000 mL | Freq: Once | INTRAVENOUS | Status: DC

## 2015-07-05 MED ORDER — METOPROLOL TARTRATE 50 MG PO TABS: 50.0000 mg | ORAL_TABLET | Freq: Three times a day (TID) | ORAL | Status: DC

## 2015-07-05 MED ORDER — METOPROLOL TARTRATE 50 MG PO TABS
50.0000 mg | ORAL_TABLET | Freq: Three times a day (TID) | ORAL | Status: DC
  Administered 2015-07-05 – 2015-07-06 (×3): 50 mg via ORAL
  Filled 2015-07-05 (×3): qty 1

## 2015-07-05 MED ORDER — POTASSIUM CHLORIDE CRYS ER 20 MEQ PO TBCR
40.0000 meq | EXTENDED_RELEASE_TABLET | ORAL | Status: AC
  Administered 2015-07-05 (×2): 40 meq via ORAL
  Filled 2015-07-05 (×2): qty 2

## 2015-07-05 MED ORDER — FUROSEMIDE 40 MG PO TABS: 40.0000 mg | ORAL_TABLET | Freq: Two times a day (BID) | ORAL | Status: DC

## 2015-07-05 MED ORDER — FUROSEMIDE 10 MG/ML IJ SOLN
40.0000 mg | Freq: Two times a day (BID) | INTRAMUSCULAR | Status: DC
  Administered 2015-07-05 – 2015-07-07 (×4): 40 mg via INTRAVENOUS
  Filled 2015-07-05 (×4): qty 4

## 2015-07-05 MED ORDER — WARFARIN SODIUM 2.5 MG PO TABS
4.5000 mg | ORAL_TABLET | Freq: Once | ORAL | Status: AC
  Administered 2015-07-05: 4.5 mg via ORAL
  Filled 2015-07-05: qty 1

## 2015-07-05 MED ORDER — FERROUS SULFATE 325 (65 FE) MG PO TABS
325.0000 mg | ORAL_TABLET | Freq: Two times a day (BID) | ORAL | Status: DC
  Administered 2015-07-05 – 2015-07-10 (×10): 325 mg via ORAL
  Filled 2015-07-05 (×10): qty 1

## 2015-07-05 NOTE — Consult Note (Signed)
Primary cardiologist: Dr Quay Burow Consulting cardiologist: Dr Carlyle Dolly Requesting physician: Dr Oren Binet  Clinical Summary Ms. Buckman is a 70 y.o.female history of chronic combined systolic HF with LVEF 123456 by echo 06/2015, history of PE on chronic coumadin, HTN admitted with symptomatic anemia. From notes she had a prior admission a few months ago also requriring transfusion with negative upper and lower endoscopy. Ferritin this admit 16 suggesting chronic blood loss as etiology, she has been transfused this admit to 9.0. From notes no plans for further GI testing as inpatient. Continue to have chronic DOE that has been ongoing for several months.   Seen recently in clinic by NP Sharolyn Douglas 07/01/15 with increased LE edema, abdominal distension. Lasix increased to 40mg  bid, started on lisionpril 2.5mg  daily. She is arrange for [facility] as outpatient that is scheduled for tomorrow with Dr [name]. This will need to be cancelled given her recurrent issues with anemia  Allergies  Allergen Reactions  . Keflex [Cephalexin] Diarrhea  . Sulfa Antibiotics Hives    Medications Scheduled Medications: . sodium chloride  10 mL/hr Intravenous Once  . furosemide  40 mg Oral BID  . lisinopril  2.5 mg Oral Daily  . metoprolol  50 mg Oral Daily  . pantoprazole (PROTONIX) IV  40 mg Intravenous Q12H  . potassium chloride  40 mEq Oral Q4H  . sodium chloride flush  3 mL Intravenous Q12H  . warfarin  4.5 mg Oral ONCE-1800  . Warfarin - Pharmacist Dosing Inpatient   Does not apply q1800     Infusions:     PRN Medications:  acetaminophen **OR** acetaminophen, morphine injection, [DISCONTINUED] ondansetron **OR** ondansetron (ZOFRAN) IV, zolpidem   Past Medical History  Diagnosis Date  . Thyroid nodule     no meds  . Hypertensive heart disease   . Pulmonary embolism, bilateral (HCC)     a. chronic coumadin.  . DVT, bilateral lower limbs ([place])   . Arthritis     knees  .  Postmenopausal vaginal bleeding   . Rash     under breasts  . Nocturia   . Obese   . Chronic combined systolic and diastolic CHF (congestive heart failure) ([place])     a. 07/2012 Echo: EF 55-60%, Gr1 DD;  b. 06/2015 Echo: EF 35-40%, triv AI, Mod MR, mod dil LA, mildly reduced RV.  . H/O cardiovascular stress test     a. 07/2012 MV: fixed mid-dist anterior and apical defect - scar vs attenuation, distal ant HK, no reversibility, EF 51%-->low risk.    Past Surgical History  Procedure Laterality Date  . Cesarean section  1992  . Hysteroscopy w/d&c  03-24-2009  . Dilation and curettage of uterus  06/20/2012    Procedure: DILATATION AND CURETTAGE;  Surgeon: [name], MD;  Location: [facility];  Service: Gynecology;  Laterality: N/A;  . Dilation and evacution  1988    missed ab  . Cardiovascular stress test  08-07-2012  DR HILTY/ DR BERRY    LOW RISK NUCLEAR STUDY/ NO ISCHEMIA/ FIXED MID TO DISTAL ANTERIOR AND APICAL DEFECT MAY REPRESENT SCAR VS BREAST ATTENUATION ARTIFACT/  MILD DISTAL ANTERIOR HYPOKINESIS/ EF 51%  . Transthoracic echocardiogram  08-08-2012    LVSF NORMAL/ EF [id number]  GRADE I DIASTOLIC DYSFUNCTION/ MILD AV AND MV REGURG./  RVSF MILDLY REDUCED  . Dilitation & currettage/hystroscopy with thermachoice ablation N/A 09/04/2012    Procedure: DILATATION & CURETTAGE WITH THERMACHOICE ABLATION;  Surgeon: [name], MD;  Location:  [facility];  Service: Gynecology;  Laterality: N/A;  . Esophagogastroduodenoscopy N/A 05/07/2015    Procedure: ESOPHAGOGASTRODUODENOSCOPY (EGD);  Surgeon: Laurence Spates, MD;  Location: [facility] ENDOSCOPY;  Service: Endoscopy;  Laterality: N/A;  . Colonoscopy N/A 05/08/2015    Procedure: COLONOSCOPY;  Surgeon: Laurence Spates, MD;  Location: [facility];  Service: Endoscopy;  Laterality: N/A;    Family History  Problem Relation Age of Onset  . Coronary artery disease Father 64  . Stroke Father   . Sudden death Sister   . Hypertension  Mother   . Mental illness Mother     anxiety    Social History Ms. Victor reports that she has never smoked. She has never used smokeless tobacco. Ms. Thurston reports that she does not drink alcohol.  Review of Systems CONSTITUTIONAL: No weight loss, fever, chills, weakness or fatigue.  HEENT: Eyes: No visual loss, blurred vision, double vision or yellow sclerae. No hearing loss, sneezing, congestion, runny nose or sore throat.  SKIN: No rash or itching.  CARDIOVASCULAR: No chest pain  RESPIRATORY:+ SOB/DO GASTROINTESTINAL: No anorexia, nausea, vomiting or diarrhea. No abdominal pain or blood.  GENITOURINARY: no polyuria, no dysuria NEUROLOGICAL: No headache, dizziness, syncope, paralysis, ataxia, numbness or tingling in the extremities. No change in bowel or bladder control.  MUSCULOSKELETAL: No muscle, back pain, joint pain or stiffness.  HEMATOLOGIC: No anemia, bleeding or bruising.  LYMPHATICS: No enlarged nodes. No history of splenectomy.  PSYCHIATRIC: No history of depression or anxiety.      Physical Examination Blood pressure 127/83, pulse 90, temperature 97.8 F (36.6 C), temperature source Oral, resp. rate 22, height 5\' 2"  (1.575 m), weight 205 lb 14.4 oz (93.396 kg), SpO2 95 %.  Intake/Output Summary (Last 24 hours) at 07/05/15 1117 Last data filed at 07/05/15 0745  Gross per 24 hour  Intake   1058 ml  Output   1425 ml  Net   -367 ml    HEENT sclera clear, throat clear  Cardiovascular: irreg, no m/r/g, no jvd  Respiratory crackles bilateral bases  GI abdomen soft, NT, ND  MSK: no LE edema  Neuro  Psych   Lab Results  Basic Metabolic Panel:  Recent Labs Lab 06/29/15 1053 07/03/15 1313 07/04/15 0445 07/05/15 0355  NA 140 143 143 145  K 3.5 3.7 3.3* 3.3*  CL 104 105 105 108  CO2 25 26 26 27   GLUCOSE 164* 101* 88 93  BUN 16 13 13 14   CREATININE 0.57 0.66 0.76 0.91  CALCIUM 8.7 8.9 8.6* 8.7*    Liver Function Tests:  Recent Labs Lab  07/01/15 1213 07/03/15 1313  AST  --  22  ALT  --  13*  ALKPHOS  --  63  BILITOT  --  1.4*  PROT  --  6.2*  ALBUMIN 3.6 3.3*    CBC:  Recent Labs Lab 07/01/15 1213 07/03/15 1313 07/04/15 0445 07/05/15 0355  WBC 8.0 6.1 6.7 7.1  HGB 7.1* 7.0* 7.5* 9.0*  HCT 26.8* 27.0* 27.6* 32.7*  MCV 67.7* 69.2* 70.4* 70.9*  PLT 612* 537* 466* 475*    Cardiac Enzymes: No results for input(s): CKTOTAL, CKMB, CKMBINDEX, TROPONINI in the last 168 hours.  BNP: Invalid input(s): POCBNP     Impression/Recommendations 1. Chronic systolic HF - fairly new diagnosis, LVEF 35-40%.  - started on medical therapy, plans for ischemic evaluation with outpatient cath however this has been postponed due to anemia - medical therapy with lisionpril 2.5mg  daily, lopressor 50mg  bid, lasix  40mg  bid - CXR with some cardiomegaly and pulm vasc congestion. BNP pending - still with SOB, will transition to IV lasix.  2. Tachycardia - tele shows MAT with elevated rates. Appears she was on lopressor 100mg  in AM and 50mg  at night at home, currently just on 50mg  daily - will start lopressor 50mg  po q 8 hrs with hold parameters, once rate controlled will need conversion to Toprol XL given her systolic dysfunction. - MAT may be contributing to her ongoing DOE and potentially her LV systolic dysfunction.   3. History of PE - on chronic coumadin   Carlyle Dolly, M.D.

## 2015-07-05 NOTE — Progress Notes (Signed)
ANTICOAGULATION CONSULT NOTE - Initial Consult  Pharmacy Consult for coumadin  Indication: hx PE and DVT  Allergies  Allergen Reactions  . Keflex [Cephalexin] Diarrhea  . Sulfa Antibiotics Hives    Patient Measurements: Height: 5\' 2"  (157.5 cm) Weight: 205 lb 14.4 oz (93.396 kg) IBW/kg (Calculated) : 50.1  Vital Signs: Temp: 97.8 F (36.6 C) (02/19 0350) BP: 104/70 mmHg (02/19 0350) Pulse Rate: 82 (02/19 0350)  Labs:  Recent Labs  07/03/15 1313 07/04/15 0445 07/04/15 1032 07/05/15 0355  HGB 7.0* 7.5*  --  9.0*  HCT 27.0* 27.6*  --  32.7*  PLT 537* 466*  --  475*  LABPROT 26.4*  --  24.4* 23.0*  INR 2.46*  --  2.22* 2.05*  CREATININE 0.66 0.76  --  0.91    Estimated Creatinine Clearance: 63.8 mL/min (by C-G formula based on Cr of 0.91).   Medical History: Past Medical History  Diagnosis Date  . Thyroid nodule     no meds  . Hypertensive heart disease   . Pulmonary embolism, bilateral (HCC)     a. chronic coumadin.  . DVT, bilateral lower limbs ([place])   . Arthritis     knees  . Postmenopausal vaginal bleeding   . Rash     under breasts  . Nocturia   . Obese   . Chronic combined systolic and diastolic CHF (congestive heart failure) ([place])     a. 07/2012 Echo: EF 55-60%, Gr1 DD;  b. 06/2015 Echo: EF 35-40%, triv AI, Mod MR, mod dil LA, mildly reduced RV.  . H/O cardiovascular stress test     a. 07/2012 MV: fixed mid-dist anterior and apical defect - scar vs attenuation, distal ant HK, no reversibility, EF 51%-->low risk.    Medications:  Prescriptions prior to admission  Medication Sig Dispense Refill Last Dose  . acetaminophen (TYLENOL) 325 MG tablet Take 650 mg by mouth every 6 (six) hours as needed for moderate pain.   Past Month at Unknown time  . enoxaparin (LOVENOX) 100 MG/ML injection Give one injection on Saturday night. Then 1 injection Sunday AM and PM. 3 Syringe 0 not started  . furosemide (LASIX) 40 MG tablet Take 1 tablet (40 mg total) by mouth  2 (two) times daily. 60 tablet 6 07/02/2015 at Unknown time  . lisinopril (PRINIVIL,ZESTRIL) 2.5 MG tablet Take 1 tablet (2.5 mg total) by mouth daily. 30 tablet 6 07/02/2015 at Unknown time  . metoprolol (LOPRESSOR) 50 MG tablet Take 2 tablets in the morning and 1 tablet in the evening. (Patient taking differently: Take 50 mg by mouth daily. ) 270 tablet 1 07/03/2015 at 1000  . nitroGLYCERIN (NITROSTAT) 0.4 MG SL tablet Place 1 tablet (0.4 mg total) under the tongue every 5 (five) minutes as needed for chest pain. 25 tablet 3 not used  . zolpidem (AMBIEN) 5 MG tablet Take 1 tablet (5 mg total) by mouth at bedtime as needed for sleep. 30 tablet 0 07/02/2015 at Unknown time  . potassium chloride SA (K-DUR,KLOR-CON) 20 MEQ tablet Take 1 tablet (20 mEq total) by mouth 2 (two) times daily. (Patient not taking: Reported on 07/03/2015) 60 tablet 6 Not Taking at Unknown time  . warfarin (COUMADIN) 3 MG tablet TAKE 1 TO 1 AND 1/2 TABLETS BY MOUTH DAILY OR AS DIRECTED (Patient taking differently: takes 4.5mg  only on sun, takes 3mg  all other days) 120 tablet 1 on hold   Scheduled:  . sodium chloride  10 mL/hr Intravenous Once  . furosemide  40 mg Oral BID  . lisinopril  2.5 mg Oral Daily  . metoprolol  50 mg Oral Daily  . pantoprazole (PROTONIX) IV  40 mg Intravenous Q12H  . potassium chloride  40 mEq Oral Q4H  . sodium chloride flush  3 mL Intravenous Q12H  . Warfarin - Pharmacist Dosing Inpatient   Does not apply q1800    Assessment: 70 yo female here with anemia on coumadin PTA for hx PE and DVT. Coumadin was on hold as outpatient with plans for cath on Monday but cath plans have been cancelled due to anemia.  -INR= 2.05, Hg up to 9.0 after PRBC  Home coumadin dose: 4.5 mg on Sunday, 3 mg all other days (last clinic visit 2/15)  Goal of Therapy:  INR 2-3 Monitor platelets by anticoagulation protocol: Yes   Plan:  -Coumadin 4.5mg  po today -Daily PT/INR  Hildred Laser, Pharm D 07/05/2015 9:36  AM

## 2015-07-05 NOTE — Progress Notes (Addendum)
PATIENT DETAILS Name: Nicole Gibbs Age: 70 y.o. Sex: female Date of Birth: November 20, 1945 Admit Date: 07/03/2015 Admitting Physician Delfina Redwood, MD [id number] L, MD   Brief narrative:  70 year old female with newly diagnosed chronic systolic heart failure-history on chronic anticoagulation for large unprovoked VTE 2014-has been having ongoing microcytic anemia for the past few months. She had a negative EGD/colonoscopy in December 2016. She was admitted on 2/17 for exertional dyspnea with a hemoglobin level of 7.0.  Subjective: Not short of breath at rest-but still short of breath upon ambulating a few feet-and walking to the bathroom from her bed.  Assessment/Plan:   Symptomatic iron deficiency anemia:  Required 1 unit of packed RBC transfusion this admission with stable H&H, she had EGD and colonoscopy in December 2016, her case was discussed by the previous physician with Dr. Jessie Foot GI who wants to do a capsule endoscopy in the office. FOBT negative this admission, now placed on oral iron after IV on 07/04/2015, continue PPI, suspect some cardiac involvement with shortness of breath as well. Cardiology requested to evaluate.  Shortness of breath: Secondary to symptomatic anemia and mild acute on chronic systolic heart failure. Transfuse as above, continue Lasix. Repeat 2 view chest x-ray 07/05/2015.  Chronic systolic heart failure: Newly diagnosed-has had cardiology follow-up as outpatient-plans were for a elective cardiac catheterization on 2/20- have discussed with cardiologist on call today will see the patient to evaluate from cardiac standpoint.  Hypokalemia: Replaced, monitor.  History of DVT/PE 2014: Per patient she has been advised by prior physicians that she needs lifelong anticoagulation. Resumed Coumadin-no evidence of active bleeding at this time. Will ask pharmacy to dose while inpatient. Patient will need follow-up with hematology as an  outpatient.  Thrombocytosis: Likely secondary to iron deficiency anemia.  Runs of sinus tachycardia with MAT. Seen by cardiology, beta blocker dose adjusted, monitor.    Disposition: Remain inpatient-home 2/19 if CBC stable.  Antimicrobial agents  See below  Anti-infectives    None      DVT Prophylaxis: Coumadin  Code Status: Full code   Family Communication None at bedside  Procedures: None  CONSULTS:  None  Time spent 25 minutes-Greater than 50% of this time was spent in counseling, explanation of diagnosis, planning of further management, and coordination of care.  MEDICATIONS: Scheduled Meds: . sodium chloride  10 mL/hr Intravenous Once  . furosemide  40 mg Intravenous BID  . lisinopril  2.5 mg Oral Daily  . metoprolol  50 mg Oral 3 times per day  . pantoprazole (PROTONIX) IV  40 mg Intravenous Q12H  . potassium chloride  40 mEq Oral Q4H  . sodium chloride flush  3 mL Intravenous Q12H  . warfarin  4.5 mg Oral ONCE-1800  . Warfarin - Pharmacist Dosing Inpatient   Does not apply q1800   Continuous Infusions:  PRN Meds:.acetaminophen **OR** acetaminophen, morphine injection, [DISCONTINUED] ondansetron **OR** ondansetron (ZOFRAN) IV, zolpidem    PHYSICAL EXAM: Vital signs in last 24 hours: Filed Vitals:   07/04/15 1425 07/04/15 2020 07/05/15 0350 07/05/15 1032  BP: 100/53 117/56 104/70 127/83  Pulse: 80 84 82 90  Temp: 97.8 F (36.6 C) 97.8 F (36.6 C) 97.8 F (36.6 C)   TempSrc: Oral     Resp: 24 22 22    Height:      Weight:   93.396 kg (205 lb 14.4 oz)   SpO2: 97% 96% 95%  Weight change: 0.68 kg (1 lb 8 oz) Filed Weights   07/03/15 1950 07/04/15 0549 07/05/15 0350  Weight: 92.715 kg (204 lb 6.4 oz) 91.944 kg (202 lb 11.2 oz) 93.396 kg (205 lb 14.4 oz)   Body mass index is 37.65 kg/(m^2).   Gen Exam: Awake and alert with clear speech.   Neck: Supple, No JVD.   Chest: B/L Clear.   CVS: S1 S2 Regular, no murmurs.  Abdomen: soft, BS  +, non tender, non distended.  Extremities: + edema, lower extremities warm to touch. Neurologic: Non Focal.   Skin: No Rash.   Wounds: N/A.   Intake/Output from previous day:  Intake/Output Summary (Last 24 hours) at 07/05/15 1152 Last data filed at 07/05/15 0745  Gross per 24 hour  Intake   1058 ml  Output   1425 ml  Net   -367 ml     LAB RESULTS: CBC  Recent Labs Lab 07/01/15 1213 07/03/15 1313 07/04/15 0445 07/05/15 0355  WBC 8.0 6.1 6.7 7.1  HGB 7.1* 7.0* 7.5* 9.0*  HCT 26.8* 27.0* 27.6* 32.7*  PLT 612* 537* 466* 475*  MCV 67.7* 69.2* 70.4* 70.9*  MCH 17.9* 17.9* 19.1* 19.5*  MCHC 26.5* 25.9* 27.2* 27.5*  RDW 22.4* 22.1* 22.8* 22.4*    Chemistries   Recent Labs Lab 06/29/15 1053 07/03/15 1313 07/04/15 0445 07/05/15 0355  NA 140 143 143 145  K 3.5 3.7 3.3* 3.3*  CL 104 105 105 108  CO2 25 26 26 27   GLUCOSE 164* 101* 88 93  BUN 16 13 13 14   CREATININE 0.57 0.66 0.76 0.91  CALCIUM 8.7 8.9 8.6* 8.7*    CBG: No results for input(s): GLUCAP in the last 168 hours.  GFR Estimated Creatinine Clearance: 63.8 mL/min (by C-G formula based on Cr of 0.91).  Coagulation profile  Recent Labs Lab 07/01/15 1046 07/03/15 1313 07/04/15 1032 07/05/15 0355  INR 3.4 2.46* 2.22* 2.05*    Cardiac Enzymes No results for input(s): CKMB, TROPONINI, MYOGLOBIN in the last 168 hours.  Invalid input(s): CK  Invalid input(s): POCBNP No results for input(s): DDIMER in the last 72 hours. No results for input(s): HGBA1C in the last 72 hours. No results for input(s): CHOL, HDL, LDLCALC, TRIG, CHOLHDL, LDLDIRECT in the last 72 hours. No results for input(s): TSH, T4TOTAL, T3FREE, THYROIDAB in the last 72 hours.  Invalid input(s): FREET3  Recent Labs  07/03/15 1558 07/05/15 0355  VITAMINB12  --  338  FERRITIN 16  --   TIBC 454*  --   IRON 14*  --   RETICCTPCT 1.8 1.9   No results for input(s): LIPASE, AMYLASE in the last 72 hours.  Urine Studies No  results for input(s): UHGB, CRYS in the last 72 hours.  Invalid input(s): UACOL, UAPR, USPG, UPH, UTP, UGL, UKET, UBIL, UNIT, UROB, ULEU, UEPI, UWBC, URBC, UBAC, CAST, UCOM, BILUA  MICROBIOLOGY: No results found for this or any previous visit (from the past 240 hour(s)).  RADIOLOGY STUDIES/RESULTS: Dg Chest Portable 1 View  07/03/2015  CLINICAL DATA:  Anemia.  Shortness of breath, fatigue. EXAM: PORTABLE CHEST 1 VIEW COMPARISON:  05/09/2015 FINDINGS: There is cardiac enlargement. Pulmonary vascular congestion is noted. No pleural effusion or edema. No airspace consolidation identified. IMPRESSION: 1. Cardiac enlargement and pulmonary vascular congestion. Electronically Signed   By: Kerby Moors M.D.   On: 07/03/2015 17:15   Signature  Thurnell Lose M.D on 07/05/2015 at 11:52 AM  Between 7am to 7pm - Pager - [phone],  After 7pm go to [email or web address] - password [email or web address]  [phone]   LOS: 2 days

## 2015-07-06 ENCOUNTER — Encounter (HOSPITAL_COMMUNITY)
Admission: EM | Disposition: A | Discharge status: Home Health | Payer: Self-pay | Source: Home / Self Care | Attending: Internal Medicine

## 2015-07-06 ENCOUNTER — Ambulatory Visit (HOSPITAL_COMMUNITY): Admission: RE | Admit: 2015-07-06 | Payer: Medicare Other | Source: Ambulatory Visit | Admitting: Cardiovascular Disease

## 2015-07-06 DIAGNOSIS — I5043 Acute on chronic combined systolic (congestive) and diastolic (congestive) heart failure: Secondary | ICD-10-CM | POA: Diagnosis not present

## 2015-07-06 DIAGNOSIS — E876 Hypokalemia: Secondary | ICD-10-CM | POA: Diagnosis present

## 2015-07-06 DIAGNOSIS — I10 Essential (primary) hypertension: Secondary | ICD-10-CM | POA: Diagnosis not present

## 2015-07-06 DIAGNOSIS — E669 Obesity, unspecified: Secondary | ICD-10-CM | POA: Diagnosis present

## 2015-07-06 DIAGNOSIS — R0602 Shortness of breath: Secondary | ICD-10-CM | POA: Diagnosis not present

## 2015-07-06 DIAGNOSIS — Z7901 Long term (current) use of anticoagulants: Secondary | ICD-10-CM | POA: Diagnosis not present

## 2015-07-06 DIAGNOSIS — D509 Iron deficiency anemia, unspecified: Secondary | ICD-10-CM | POA: Diagnosis present

## 2015-07-06 DIAGNOSIS — D7589 Other specified diseases of blood and blood-forming organs: Secondary | ICD-10-CM | POA: Diagnosis present

## 2015-07-06 DIAGNOSIS — I110 Hypertensive heart disease with heart failure: Secondary | ICD-10-CM | POA: Diagnosis present

## 2015-07-06 DIAGNOSIS — R000 Tachycardia, unspecified: Secondary | ICD-10-CM | POA: Diagnosis not present

## 2015-07-06 DIAGNOSIS — R0600 Dyspnea, unspecified: Secondary | ICD-10-CM

## 2015-07-06 DIAGNOSIS — Z882 Allergy status to sulfonamides status: Secondary | ICD-10-CM | POA: Diagnosis not present

## 2015-07-06 DIAGNOSIS — Z79899 Other long term (current) drug therapy: Secondary | ICD-10-CM | POA: Diagnosis not present

## 2015-07-06 DIAGNOSIS — Z86711 Personal history of pulmonary embolism: Secondary | ICD-10-CM | POA: Diagnosis not present

## 2015-07-06 DIAGNOSIS — M170 Bilateral primary osteoarthritis of knee: Secondary | ICD-10-CM | POA: Diagnosis present

## 2015-07-06 DIAGNOSIS — I5022 Chronic systolic (congestive) heart failure: Secondary | ICD-10-CM | POA: Diagnosis present

## 2015-07-06 DIAGNOSIS — R0609 Other forms of dyspnea: Secondary | ICD-10-CM | POA: Diagnosis not present

## 2015-07-06 DIAGNOSIS — D649 Anemia, unspecified: Secondary | ICD-10-CM | POA: Diagnosis not present

## 2015-07-06 DIAGNOSIS — Z823 Family history of stroke: Secondary | ICD-10-CM | POA: Diagnosis not present

## 2015-07-06 DIAGNOSIS — Z6841 Body Mass Index (BMI) 40.0 and over, adult: Secondary | ICD-10-CM | POA: Diagnosis not present

## 2015-07-06 DIAGNOSIS — I951 Orthostatic hypotension: Secondary | ICD-10-CM | POA: Diagnosis not present

## 2015-07-06 DIAGNOSIS — Z8249 Family history of ischemic heart disease and other diseases of the circulatory system: Secondary | ICD-10-CM | POA: Diagnosis not present

## 2015-07-06 DIAGNOSIS — Z881 Allergy status to other antibiotic agents status: Secondary | ICD-10-CM | POA: Diagnosis not present

## 2015-07-06 DIAGNOSIS — Z86718 Personal history of other venous thrombosis and embolism: Secondary | ICD-10-CM | POA: Diagnosis not present

## 2015-07-06 LAB — FOLATE RBC
FOLATE, HEMOLYSATE: 412.2 ng/mL
Folate, RBC: 1296 ng/mL (ref >498)
HEMATOCRIT: 31.8 % — AB (ref 34.0–46.6)

## 2015-07-06 LAB — BASIC METABOLIC PANEL
Anion gap: 11 (ref 5–15)
BUN: 16 mg/dL (ref 6–20)
CHLORIDE: 108 mmol/L (ref 101–111)
CO2: 23 mmol/L (ref 22–32)
CREATININE: 0.8 mg/dL (ref 0.44–1.00)
Calcium: 8.9 mg/dL (ref 8.9–10.3)
GFR calc non Af Amer: >60 mL/min (ref >60)
Glucose, Bld: 106 mg/dL — ABNORMAL HIGH (ref 65–99)
POTASSIUM: 4.1 mmol/L (ref 3.5–5.1)
Sodium: 142 mmol/L (ref 135–145)

## 2015-07-06 LAB — PROTIME-INR
INR: 1.79 — ABNORMAL HIGH (ref 0.00–1.49)
Prothrombin Time: 20.8 seconds — ABNORMAL HIGH (ref 11.6–15.2)

## 2015-07-06 LAB — MAGNESIUM: Magnesium: 1.9 mg/dL (ref 1.7–2.4)

## 2015-07-06 SURGERY — LEFT HEART CATH AND CORONARY ANGIOGRAPHY

## 2015-07-06 MED ORDER — DIPHENHYDRAMINE HCL 25 MG PO CAPS
25.0000 mg | ORAL_CAPSULE | ORAL | Status: DC | PRN
  Administered 2015-07-07: 25 mg via ORAL
  Filled 2015-07-06: qty 1

## 2015-07-06 MED ORDER — METOPROLOL SUCCINATE ER 100 MG PO TB24
100.0000 mg | ORAL_TABLET | Freq: Every day | ORAL | Status: DC
  Administered 2015-07-06 – 2015-07-09 (×4): 100 mg via ORAL
  Filled 2015-07-06 (×4): qty 1

## 2015-07-06 MED ORDER — WARFARIN SODIUM 2.5 MG PO TABS
4.5000 mg | ORAL_TABLET | Freq: Once | ORAL | Status: AC
  Administered 2015-07-06: 4.5 mg via ORAL
  Filled 2015-07-06: qty 1

## 2015-07-06 MED ORDER — DIPHENHYDRAMINE HCL 25 MG PO CAPS
50.0000 mg | ORAL_CAPSULE | Freq: Once | ORAL | Status: AC
  Administered 2015-07-06: 50 mg via ORAL
  Filled 2015-07-06: qty 2

## 2015-07-06 NOTE — Progress Notes (Signed)
Patient Name: Nicole Gibbs Date of Encounter: 07/06/2015  Primary Cardiologist: Dr. [name]   Principal Problem:   Symptomatic anemia Active Problems:   HTN (hypertension)   Obesity, unspecified   Dyspnea on exertion   Chronic combined systolic and diastolic CHF (congestive heart failure) (HCC)   Thrombocytosis (HCC)    SUBJECTIVE  Denies any CP or SOB.   CURRENT MEDS . sodium chloride  10 mL/hr Intravenous Once  . ferrous sulfate  325 mg Oral BID WC  . furosemide  40 mg Intravenous BID  . lisinopril  2.5 mg Oral Daily  . metoprolol  50 mg Oral 3 times per day  . pantoprazole (PROTONIX) IV  40 mg Intravenous Q12H  . sodium chloride flush  3 mL Intravenous Q12H  . Warfarin - Pharmacist Dosing Inpatient   Does not apply q1800    OBJECTIVE  Filed Vitals:   07/05/15 1459 07/05/15 1734 07/05/15 2100 07/06/15 0500  BP: 136/80 110/65 130/69 113/65  Pulse: 81 83 79 91  Temp:  98.3 F (36.8 C) 98 F (36.7 C) 98.1 F (36.7 C)  TempSrc:  Oral    Resp:  18 21 22   Height:      Weight:    205 lb 3.2 oz (93.078 kg)  SpO2:  97% 98% 90%    Intake/Output Summary (Last 24 hours) at 07/06/15 0817 Last data filed at 07/06/15 0500  Gross per 24 hour  Intake    540 ml  Output   2395 ml  Net  -1855 ml   Filed Weights   07/04/15 0549 07/05/15 0350 07/06/15 0500  Weight: 202 lb 11.2 oz (91.944 kg) 205 lb 14.4 oz (93.396 kg) 205 lb 3.2 oz (93.078 kg)    PHYSICAL EXAM  General: Pleasant, NAD. Neuro: Alert and oriented X 3. Moves all extremities spontaneously. Psych: Normal affect. HEENT:  Normal  Neck: Supple without bruits or JVD. Lungs:  Resp regular and unlabored. Mild intermittent R basilar rale, otherwise largely CTA Heart: RRR no s3, s4, or murmurs. Abdomen: Soft, non-tender, non-distended, BS + x 4.  Extremities: No clubbing, cyanosis. DP/PT/Radials 2+ and equal bilaterally. 2+ nonpitting edema  Accessory Clinical Findings  CBC  Recent Labs  07/04/15 0445  07/05/15 0355  WBC 6.7 7.1  HGB 7.5* 9.0*  HCT 27.6* 32.7*  MCV 70.4* 70.9*  PLT 466* 123XX123*   Basic Metabolic Panel  Recent Labs  07/05/15 0355 07/06/15 0623  NA 145 142  K 3.3* 4.1  CL 108 108  CO2 27 23  GLUCOSE 93 106*  BUN 14 16  CREATININE 0.91 0.80  CALCIUM 8.7* 8.9  MG  --  1.9   Liver Function Tests  Recent Labs  07/03/15 1313  AST 22  ALT 13*  ALKPHOS 63  BILITOT 1.4*  PROT 6.2*  ALBUMIN 3.3*    TELE NSR with PACs, largely rate controlled.     ECG  No new EKG  Echocardiogram 06/29/2015  LV EF: 35% -  40%  ------------------------------------------------------------------- Indications:   Dyspnea (R06.00).  ------------------------------------------------------------------- History:  PMH: Edema, Pulmonary Embolism, Deep Vein Thrombosis. Risk factors: Family history of coronary artery disease. Hypertension.  ------------------------------------------------------------------- Study Conclusions  - Left ventricle: Wall thickness was increased in a pattern of mild LVH. Systolic function was moderately reduced. The estimated ejection fraction was in the range of 35% to 40%. - Aortic valve: There was trivial regurgitation. - Mitral valve: Calcified annulus. Moderately thickened leaflets . There was moderate regurgitation. - Left atrium: The  atrium was moderately dilated. - Right ventricle: The cavity size was mildly dilated. Systolic function was mildly reduced.     Radiology/Studies  Dg Chest 2 View  07/05/2015  CLINICAL DATA:  Newly diagnosed heart failure on chronic anticoagulation. Anemia. Exertional dyspnea 1 year. EXAM: CHEST  2 VIEW COMPARISON:  07/03/2015 and 05/09/2015 FINDINGS: Lungs are somewhat hypoinflated without lobar consolidation or effusion. Minimal linear atelectasis/ scarring over the left mid to lower lung. Minimal prominence of the perihilar markings suggesting mild vascular congestion. Mild stable  cardiomegaly. Degenerative change of the spine. IMPRESSION: Mild stable cardiomegaly with minimal vascular congestion. Electronically Signed   By: Marin Olp M.D.   On: 07/05/2015 13:51   Dg Chest Portable 1 View  07/03/2015  CLINICAL DATA:  Anemia.  Shortness of breath, fatigue. EXAM: PORTABLE CHEST 1 VIEW COMPARISON:  05/09/2015 FINDINGS: There is cardiac enlargement. Pulmonary vascular congestion is noted. No pleural effusion or edema. No airspace consolidation identified. IMPRESSION: 1. Cardiac enlargement and pulmonary vascular congestion. Electronically Signed   By: Kerby Moors M.D.   On: 07/03/2015 17:15    ASSESSMENT AND PLAN  1. Chronic systolic HF in the setting of worsening anemia  - fairly new diagnosis, LVEF 35-40% on echo 06/29/2015  - started on medical therapy, plans for ischemic evaluation with outpatient cath however this has been postponed due to anemia  - medical therapy with lisionpril 2.5mg  daily, lopressor 50mg  bid  - still mildly fluid overloaded, would prefer to diurese her one more day, and potentially transition to PO lasix tomorrow.   2. Tachycardia  - tele shows MAT with elevated rates. Appears she was on lopressor 100mg  in AM and 50mg  at night at home, currently just on 50mg  daily  - she is on 50mg  TID of lopressor, technically can transition to 150mg  daily, however her SBP 110s, would want to avoid dropping it too low. Will transition to 100mg  daily of Toprol XL.   - MAT may be contributing to her ongoing DOE and potentially her LV systolic dysfunction.   3. History of PE  - on chronic coumadin  4. Recurrent anemia s/p transfusion: s/p EGD and colonoscopy on 05/08/2015, no obvious source of bleeding.   - anemia panel 07/03/2015, Iron 14, TIBC 454, Sat ratio 3.   Hilbert Corrigan PA-C Pager: [phone]   The patient was seen, examined and discussed with Almyra Deforest, PA-C and I agree with the above.   The patient remains fluid overloaded with LE edema and  B/L rales, we will continue iv diuresis, switch to PO tomorrow. We will call for PT& OT as she feel insecure to walk as she recently fell and broke her wrist.  Dorothy Spark 07/06/2015

## 2015-07-06 NOTE — Progress Notes (Signed)
PATIENT DETAILS Name: Nicole Gibbs Age: 70 y.o. Sex: female Date of Birth: 1945-10-11 Admit Date: 07/03/2015 Admitting Physician Delfina Redwood, MD [id number] L, MD   Brief narrative:  70 year old female with newly diagnosed chronic systolic heart failure-history on chronic anticoagulation for large unprovoked VTE 2014-has been having ongoing microcytic anemia for the past few months. She had a negative EGD/colonoscopy in December 2016. She was admitted on 2/17 for exertional dyspnea with a hemoglobin level of 7.0.  Subjective: Not short of breath at rest-but still short of breath upon ambulating a few feet-and walking to the bathroom from her bed.  Assessment/Plan:   Symptomatic iron deficiency anemia:  Required 1 unit of packed RBC transfusion this admission with stable H&H, she had EGD and colonoscopy in December 2016, her case was discussed by the previous physician with Dr. Jessie Foot GI who wants to do a capsule endoscopy in the office. FOBT negative this admission, now placed on oral iron after IV on 07/04/2015, continue PPI, suspect some cardiac involvement with shortness of breath as well. Cardiology requested to evaluate and following, continue diuresis.  Shortness of breath: Secondary to symptomatic anemia and mild acute on chronic systolic heart failure. Transfuse as above, continue Lasix. Repeat 2 view chest x-ray 07/05/2015.  Mild acute on chronic Chronic systolic heart failure EF close to 35%: Newly diagnosed-has had cardiology follow-up as outpatient-per cardiology IV Lasix now and left heart catheterization post discharged as outpatient, Continue beta blocker and ACE inhibitor.  Hypokalemia: Replaced, monitor.  History of DVT/PE 2014: Per patient she has been advised by prior physicians that she needs lifelong anticoagulation. Resumed Coumadin-no evidence of active bleeding at this time. Will ask pharmacy to dose while inpatient. Patient will  need follow-up with hematology as an outpatient.  Thrombocytosis: Likely secondary to iron deficiency anemia.  Runs of sinus tachycardia with MAT. Seen by cardiology, beta blocker dose adjusted, monitor.    Disposition: Remain inpatient-home 2/19 if CBC stable.  Antimicrobial agents  See below  Anti-infectives    None      DVT Prophylaxis: Coumadin  Lab Results  Component Value Date   INR 1.79* 07/06/2015   INR 2.05* 07/05/2015   INR 2.22* 07/04/2015    Code Status: Full code   Family Communication None at bedside  Procedures: None  CONSULTS:   Cards  Time spent 25 minutes-Greater than 50% of this time was spent in counseling, explanation of diagnosis, planning of further management, and coordination of care.  MEDICATIONS: Scheduled Meds: . sodium chloride  10 mL/hr Intravenous Once  . ferrous sulfate  325 mg Oral BID WC  . furosemide  40 mg Intravenous BID  . lisinopril  2.5 mg Oral Daily  . metoprolol succinate  100 mg Oral Daily  . pantoprazole (PROTONIX) IV  40 mg Intravenous Q12H  . sodium chloride flush  3 mL Intravenous Q12H  . Warfarin - Pharmacist Dosing Inpatient   Does not apply q1800   Continuous Infusions:  PRN Meds:.acetaminophen **OR** acetaminophen, diphenhydrAMINE, morphine injection, [DISCONTINUED] ondansetron **OR** ondansetron (ZOFRAN) IV, zolpidem    PHYSICAL EXAM: Vital signs in last 24 hours: Filed Vitals:   07/05/15 1459 07/05/15 1734 07/05/15 2100 07/06/15 0500  BP: 136/80 110/65 130/69 113/65  Pulse: 81 83 79 91  Temp:  98.3 F (36.8 C) 98 F (36.7 C) 98.1 F (36.7 C)  TempSrc:  Oral    Resp:  18 21 22  Height:      Weight:    93.078 kg (205 lb 3.2 oz)  SpO2:  97% 98% 90%    Weight change: -0.318 kg (-11.2 oz) Filed Weights   07/04/15 0549 07/05/15 0350 07/06/15 0500  Weight: 91.944 kg (202 lb 11.2 oz) 93.396 kg (205 lb 14.4 oz) 93.078 kg (205 lb 3.2 oz)   Body mass index is 37.52 kg/(m^2).   Gen Exam:  Awake and alert with clear speech.   Neck: Supple, No JVD.   Chest: B/L Clear.   CVS: S1 S2 Regular, no murmurs.  Abdomen: soft, BS +, non tender, non distended.  Extremities: + edema, lower extremities warm to touch. Neurologic: Non Focal.   Skin: No Rash.   Wounds: N/A.   Intake/Output from previous day:  Intake/Output Summary (Last 24 hours) at 07/06/15 1044 Last data filed at 07/06/15 0930  Gross per 24 hour  Intake    720 ml  Output   3395 ml  Net  -2675 ml     LAB RESULTS: CBC  Recent Labs Lab 07/01/15 1213 07/03/15 1313 07/04/15 0445 07/05/15 0355  WBC 8.0 6.1 6.7 7.1  HGB 7.1* 7.0* 7.5* 9.0*  HCT 26.8* 27.0* 27.6* 32.7*  PLT 612* 537* 466* 475*  MCV 67.7* 69.2* 70.4* 70.9*  MCH 17.9* 17.9* 19.1* 19.5*  MCHC 26.5* 25.9* 27.2* 27.5*  RDW 22.4* 22.1* 22.8* 22.4*    Chemistries   Recent Labs Lab 06/29/15 1053 07/03/15 1313 07/04/15 0445 07/05/15 0355 07/06/15 0623  NA 140 143 143 145 142  K 3.5 3.7 3.3* 3.3* 4.1  CL 104 105 105 108 108  CO2 25 26 26 27 23   GLUCOSE 164* 101* 88 93 106*  BUN 16 13 13 14 16   CREATININE 0.57 0.66 0.76 0.91 0.80  CALCIUM 8.7 8.9 8.6* 8.7* 8.9  MG  --   --   --   --  1.9    CBG: No results for input(s): GLUCAP in the last 168 hours.  GFR Estimated Creatinine Clearance: 72.5 mL/min (by C-G formula based on Cr of 0.8).  Coagulation profile  Recent Labs Lab 07/01/15 1046 07/03/15 1313 07/04/15 1032 07/05/15 0355 07/06/15 0623  INR 3.4 2.46* 2.22* 2.05* 1.79*    Cardiac Enzymes No results for input(s): CKMB, TROPONINI, MYOGLOBIN in the last 168 hours.  Invalid input(s): CK  Invalid input(s): POCBNP No results for input(s): DDIMER in the last 72 hours. No results for input(s): HGBA1C in the last 72 hours. No results for input(s): CHOL, HDL, LDLCALC, TRIG, CHOLHDL, LDLDIRECT in the last 72 hours. No results for input(s): TSH, T4TOTAL, T3FREE, THYROIDAB in the last 72 hours.  Invalid input(s):  FREET3  Recent Labs  07/03/15 1558 07/05/15 0355  VITAMINB12  --  338  FERRITIN 16  --   TIBC 454*  --   IRON 14*  --   RETICCTPCT 1.8 1.9   No results for input(s): LIPASE, AMYLASE in the last 72 hours.  Urine Studies No results for input(s): UHGB, CRYS in the last 72 hours.  Invalid input(s): UACOL, UAPR, USPG, UPH, UTP, UGL, UKET, UBIL, UNIT, UROB, ULEU, UEPI, UWBC, URBC, UBAC, CAST, UCOM, BILUA  MICROBIOLOGY: No results found for this or any previous visit (from the past 240 hour(s)).  RADIOLOGY STUDIES/RESULTS: Dg Chest 2 View  07/05/2015  CLINICAL DATA:  Newly diagnosed heart failure on chronic anticoagulation. Anemia. Exertional dyspnea 1 year. EXAM: CHEST  2 VIEW COMPARISON:  07/03/2015 and 05/09/2015 FINDINGS: Lungs are somewhat  hypoinflated without lobar consolidation or effusion. Minimal linear atelectasis/ scarring over the left mid to lower lung. Minimal prominence of the perihilar markings suggesting mild vascular congestion. Mild stable cardiomegaly. Degenerative change of the spine. IMPRESSION: Mild stable cardiomegaly with minimal vascular congestion. Electronically Signed   By: Marin Olp M.D.   On: 07/05/2015 13:51   Dg Chest Portable 1 View  07/03/2015  CLINICAL DATA:  Anemia.  Shortness of breath, fatigue. EXAM: PORTABLE CHEST 1 VIEW COMPARISON:  05/09/2015 FINDINGS: There is cardiac enlargement. Pulmonary vascular congestion is noted. No pleural effusion or edema. No airspace consolidation identified. IMPRESSION: 1. Cardiac enlargement and pulmonary vascular congestion. Electronically Signed   By: Kerby Moors M.D.   On: 07/03/2015 17:15   Signature  Thurnell Lose M.D on 07/06/2015 at 10:44 AM  Between 7am to 7pm - Pager - [phone], After 7pm go to [email or web address] - password [email or web address]  [phone]   LOS: 3 days

## 2015-07-06 NOTE — Care Management Note (Addendum)
Case Management Note  Patient Details  Name: Nicole Gibbs MRN: [id number] Date of Birth: [birth year]  Subjective/Objective: Pt admitted for SOB and anemia. Pt is from home with husband that has progressive MS. Per pt husband has a caregiver 7 days a week. The daughter just moved in with them. Pt is unable to drive at this time.                  Action/Plan: CM did offer pt information on [employer] transportation and she had the info already. CM did make suggestions that Meals on Wheels may be of assistance for the patient and husband. No further needs from CM at this time.  Expected Discharge Date:                  Expected Discharge Plan:  Home/ Home Health Services In-House Referral:  NA  Discharge planning Services  CM Consult  Post Acute Care Choice:  Home Health, Resumption of Services Choice offered to:  Patient DME Arranged:  N/A DME Agency:  NA  HH Arranged:  Registered Nurse, Physical Therapy HH Agency: [place] Status of Service:  Completed, signed off  Medicare Important Message Given:    Date Medicare IM Given:    Medicare IM give by:    Date Additional Medicare IM Given:    Additional Medicare Important Message give by:     If discussed at [place] of Stay Meetings, dates discussed:    Additional Comments: [place] 07-10-15 Jacqlyn Krauss, RN,BSN [phone] Information faxed to [facility]. SOC to begin within 24-48 hours post d/c. [facility] to f/u with pt as well. Pt is in [place] did call [facility] to make her aware of d/c. No further needs from CM at this time.   1158 07-07-15 Jacqlyn Krauss, RN,BSN [phone] CM went back in to speak with pt in regards to disposition needs. Pt is currently active with [facility] for RN and PT. RN for CHF management. Pt was given a scale via [employer]. CM will fax orders to company. CM did place a referral for [facility] and to the [place]. No further needs from CM at this  time.   Bethena Roys, RN 07/06/2015, 11:31 AM

## 2015-07-06 NOTE — Progress Notes (Signed)
Pt had 3 beat run of vtach. Pt asymptomatic. VSS. Will continue to monitor.    Ruben Reason, RN

## 2015-07-06 NOTE — Care Management Obs Status (Signed)
MEDICARE OBSERVATION STATUS NOTIFICATION   Patient Details  Name: Nicole Gibbs MRN: [id number] Date of Birth: 1946/03/19   Medicare Observation Status Notification Given:   (SOB, Symptomatic Anemia 7.1 at Dr's office. Anemia-HGB 9.0,)    Bethena Roys, RN 07/06/2015, 11:29 AM

## 2015-07-06 NOTE — Progress Notes (Signed)
ANTICOAGULATION CONSULT NOTE - [place] for coumadin  Indication: hx PE and DVT  Allergies  Allergen Reactions  . Keflex [Cephalexin] Diarrhea  . Sulfa Antibiotics Hives    Patient Measurements: Height: 5\' 2"  (157.5 cm) Weight: 205 lb 3.2 oz (93.078 kg) IBW/kg (Calculated) : 50.1  Vital Signs: Temp: 98.1 F (36.7 C) (02/20 0500) BP: 113/65 mmHg (02/20 0500) Pulse Rate: 91 (02/20 0500)  Labs:  Recent Labs  07/03/15 1313 07/04/15 0445 07/04/15 1032 07/05/15 0355 07/06/15 0623  HGB 7.0* 7.5*  --  9.0*  --   HCT 27.0* 27.6*  --  32.7*  --   PLT 537* 466*  --  475*  --   LABPROT 26.4*  --  24.4* 23.0* 20.8*  INR 2.46*  --  2.22* 2.05* 1.79*  CREATININE 0.66 0.76  --  0.91 0.80    Estimated Creatinine Clearance: 72.5 mL/min (by C-G formula based on Cr of 0.8).   Medical History: Past Medical History  Diagnosis Date  . Thyroid nodule     no meds  . Hypertensive heart disease   . Pulmonary embolism, bilateral (HCC)     a. chronic coumadin.  . DVT, bilateral lower limbs ([place])   . Arthritis     knees  . Postmenopausal vaginal bleeding   . Rash     under breasts  . Nocturia   . Obese   . Chronic combined systolic and diastolic CHF (congestive heart failure) ([place])     a. 07/2012 Echo: EF 55-60%, Gr1 DD;  b. 06/2015 Echo: EF 35-40%, triv AI, Mod MR, mod dil LA, mildly reduced RV.  . H/O cardiovascular stress test     a. 07/2012 MV: fixed mid-dist anterior and apical defect - scar vs attenuation, distal ant HK, no reversibility, EF 51%-->low risk.    Medications:  Prescriptions prior to admission  Medication Sig Dispense Refill Last Dose  . acetaminophen (TYLENOL) 325 MG tablet Take 650 mg by mouth every 6 (six) hours as needed for moderate pain.   Past Month at Unknown time  . enoxaparin (LOVENOX) 100 MG/ML injection Give one injection on Saturday night. Then 1 injection Sunday AM and PM. 3 Syringe 0 not started  . furosemide (LASIX) 40  MG tablet Take 1 tablet (40 mg total) by mouth 2 (two) times daily. 60 tablet 6 07/02/2015 at Unknown time  . lisinopril (PRINIVIL,ZESTRIL) 2.5 MG tablet Take 1 tablet (2.5 mg total) by mouth daily. 30 tablet 6 07/02/2015 at Unknown time  . metoprolol (LOPRESSOR) 50 MG tablet Take 2 tablets in the morning and 1 tablet in the evening. (Patient taking differently: Take 50 mg by mouth daily. ) 270 tablet 1 07/03/2015 at 1000  . nitroGLYCERIN (NITROSTAT) 0.4 MG SL tablet Place 1 tablet (0.4 mg total) under the tongue every 5 (five) minutes as needed for chest pain. 25 tablet 3 not used  . zolpidem (AMBIEN) 5 MG tablet Take 1 tablet (5 mg total) by mouth at bedtime as needed for sleep. 30 tablet 0 07/02/2015 at Unknown time  . potassium chloride SA (K-DUR,KLOR-CON) 20 MEQ tablet Take 1 tablet (20 mEq total) by mouth 2 (two) times daily. (Patient not taking: Reported on 07/03/2015) 60 tablet 6 Not Taking at Unknown time  . warfarin (COUMADIN) 3 MG tablet TAKE 1 TO 1 AND 1/2 TABLETS BY MOUTH DAILY OR AS DIRECTED (Patient taking differently: takes 4.5mg  only on sun, takes 3mg  all other days) 120 tablet 1 on hold  Scheduled:  . sodium chloride  10 mL/hr Intravenous Once  . ferrous sulfate  325 mg Oral BID WC  . furosemide  40 mg Intravenous BID  . lisinopril  2.5 mg Oral Daily  . metoprolol succinate  100 mg Oral Daily  . pantoprazole (PROTONIX) IV  40 mg Intravenous Q12H  . sodium chloride flush  3 mL Intravenous Q12H  . Warfarin - Pharmacist Dosing Inpatient   Does not apply q1800    Assessment: 70 yo female here with anemia on coumadin PTA for hx PE and DVT. Coumadin was on hold as outpatient with plans for cath on Monday but cath plans have been cancelled due to anemia.   INR trending down due to held doses.  -INR= 1.79, Hg up to 9.0 after PRBC  Home coumadin dose: 4.5 mg on Sunday, 3 mg all other days (last clinic visit 2/15)  Goal of Therapy:  INR 2-3 Monitor platelets by anticoagulation  protocol: Yes   Plan:  -Repeat Coumadin 4.5mg  po today -Daily PT/INR  [employer], Pharm.D., BCPS Clinical Pharmacist Pager [phone] 07/06/2015 11:00 AM

## 2015-07-07 ENCOUNTER — Other Ambulatory Visit: Payer: Self-pay | Admitting: *Deleted

## 2015-07-07 LAB — BASIC METABOLIC PANEL
ANION GAP: 11 (ref 5–15)
BUN: 15 mg/dL (ref 6–20)
CHLORIDE: 104 mmol/L (ref 101–111)
CO2: 28 mmol/L (ref 22–32)
Calcium: 8.3 mg/dL — ABNORMAL LOW (ref 8.9–10.3)
Creatinine, Ser: 0.84 mg/dL (ref 0.44–1.00)
GFR calc Af Amer: >60 mL/min (ref >60)
GLUCOSE: 95 mg/dL (ref 65–99)
POTASSIUM: 3.3 mmol/L — AB (ref 3.5–5.1)
Sodium: 143 mmol/L (ref 135–145)

## 2015-07-07 LAB — PROTIME-INR
INR: 1.74 — AB (ref 0.00–1.49)
Prothrombin Time: 20.3 seconds — ABNORMAL HIGH (ref 11.6–15.2)

## 2015-07-07 LAB — HEMOGLOBIN AND HEMATOCRIT, BLOOD
HCT: 31.9 % — ABNORMAL LOW (ref 36.0–46.0)
HEMOGLOBIN: 8.9 g/dL — AB (ref 12.0–15.0)

## 2015-07-07 MED ORDER — WARFARIN SODIUM 2.5 MG PO TABS
4.5000 mg | ORAL_TABLET | Freq: Once | ORAL | Status: AC
  Administered 2015-07-07: 4.5 mg via ORAL
  Filled 2015-07-07: qty 1

## 2015-07-07 MED ORDER — METOLAZONE 5 MG PO TABS
2.5000 mg | ORAL_TABLET | Freq: Once | ORAL | Status: AC
  Administered 2015-07-07: 2.5 mg via ORAL
  Filled 2015-07-07: qty 1

## 2015-07-07 MED ORDER — FUROSEMIDE 10 MG/ML IJ SOLN
80.0000 mg | Freq: Two times a day (BID) | INTRAMUSCULAR | Status: DC
  Administered 2015-07-07 – 2015-07-08 (×2): 80 mg via INTRAVENOUS
  Filled 2015-07-07 (×2): qty 8

## 2015-07-07 MED ORDER — METOLAZONE 5 MG PO TABS
2.5000 mg | ORAL_TABLET | Freq: Every day | ORAL | Status: DC
  Administered 2015-07-08 – 2015-07-09 (×2): 2.5 mg via ORAL
  Filled 2015-07-07 (×2): qty 1

## 2015-07-07 MED ORDER — POTASSIUM CHLORIDE CRYS ER 20 MEQ PO TBCR
40.0000 meq | EXTENDED_RELEASE_TABLET | ORAL | Status: AC
Start: 2015-07-07 — End: 2015-07-07
  Administered 2015-07-07 (×2): 40 meq via ORAL
  Filled 2015-07-07 (×2): qty 2

## 2015-07-07 MED ORDER — FUROSEMIDE 10 MG/ML IJ SOLN: 60.0000 mg | Freq: Two times a day (BID) | INTRAMUSCULAR | Status: DC

## 2015-07-07 MED ORDER — POTASSIUM CHLORIDE CRYS ER 20 MEQ PO TBCR
40.0000 meq | EXTENDED_RELEASE_TABLET | Freq: Two times a day (BID) | ORAL | Status: AC
Start: 2015-07-07 — End: 2015-07-08
  Administered 2015-07-07 – 2015-07-08 (×4): 40 meq via ORAL
  Filled 2015-07-07 (×4): qty 2

## 2015-07-07 NOTE — Progress Notes (Signed)
ANTICOAGULATION CONSULT NOTE - [place] for coumadin  Indication: hx PE and DVT  Allergies  Allergen Reactions  . Keflex [Cephalexin] Diarrhea  . Sulfa Antibiotics Hives    Patient Measurements: Height: 5\' 2"  (157.5 cm) Weight: 202 lb 9.6 oz (91.899 kg) IBW/kg (Calculated) : 50.1  Vital Signs: Temp: 97.7 F (36.5 C) (02/21 0513) Temp Source: Oral (02/21 0513) BP: 115/68 mmHg (02/21 0513) Pulse Rate: 75 (02/21 0513)  Labs:  Recent Labs  07/05/15 0355 07/06/15 0623 07/07/15 0504  HGB 9.0*  --  8.9*  HCT 32.7*  31.8*  --  31.9*  PLT 475*  --   --   LABPROT 23.0* 20.8* 20.3*  INR 2.05* 1.79* 1.74*  CREATININE 0.91 0.80 0.84    Estimated Creatinine Clearance: 68.5 mL/min (by C-G formula based on Cr of 0.84).   Medical History: Past Medical History  Diagnosis Date  . Thyroid nodule     no meds  . Hypertensive heart disease   . Pulmonary embolism, bilateral (HCC)     a. chronic coumadin.  . DVT, bilateral lower limbs ([place])   . Arthritis     knees  . Postmenopausal vaginal bleeding   . Rash     under breasts  . Nocturia   . Obese   . Chronic combined systolic and diastolic CHF (congestive heart failure) ([place])     a. 07/2012 Echo: EF 55-60%, Gr1 DD;  b. 06/2015 Echo: EF 35-40%, triv AI, Mod MR, mod dil LA, mildly reduced RV.  . H/O cardiovascular stress test     a. 07/2012 MV: fixed mid-dist anterior and apical defect - scar vs attenuation, distal ant HK, no reversibility, EF 51%-->low risk.    Medications:  Prescriptions prior to admission  Medication Sig Dispense Refill Last Dose  . acetaminophen (TYLENOL) 325 MG tablet Take 650 mg by mouth every 6 (six) hours as needed for moderate pain.   Past Month at Unknown time  . enoxaparin (LOVENOX) 100 MG/ML injection Give one injection on Saturday night. Then 1 injection Sunday AM and PM. 3 Syringe 0 not started  . furosemide (LASIX) 40 MG tablet Take 1 tablet (40 mg total) by mouth 2  (two) times daily. 60 tablet 6 07/02/2015 at Unknown time  . lisinopril (PRINIVIL,ZESTRIL) 2.5 MG tablet Take 1 tablet (2.5 mg total) by mouth daily. 30 tablet 6 07/02/2015 at Unknown time  . metoprolol (LOPRESSOR) 50 MG tablet Take 2 tablets in the morning and 1 tablet in the evening. (Patient taking differently: Take 50 mg by mouth daily. ) 270 tablet 1 07/03/2015 at 1000  . nitroGLYCERIN (NITROSTAT) 0.4 MG SL tablet Place 1 tablet (0.4 mg total) under the tongue every 5 (five) minutes as needed for chest pain. 25 tablet 3 not used  . zolpidem (AMBIEN) 5 MG tablet Take 1 tablet (5 mg total) by mouth at bedtime as needed for sleep. 30 tablet 0 07/02/2015 at Unknown time  . potassium chloride SA (K-DUR,KLOR-CON) 20 MEQ tablet Take 1 tablet (20 mEq total) by mouth 2 (two) times daily. (Patient not taking: Reported on 07/03/2015) 60 tablet 6 Not Taking at Unknown time  . warfarin (COUMADIN) 3 MG tablet TAKE 1 TO 1 AND 1/2 TABLETS BY MOUTH DAILY OR AS DIRECTED (Patient taking differently: takes 4.5mg  only on sun, takes 3mg  all other days) 120 tablet 1 on hold   Scheduled:  . sodium chloride  10 mL/hr Intravenous Once  . ferrous sulfate  325 mg Oral  BID WC  . furosemide  40 mg Intravenous BID  . lisinopril  2.5 mg Oral Daily  . metoprolol succinate  100 mg Oral Daily  . pantoprazole (PROTONIX) IV  40 mg Intravenous Q12H  . potassium chloride  40 mEq Oral Q4H  . sodium chloride flush  3 mL Intravenous Q12H  . Warfarin - Pharmacist Dosing Inpatient   Does not apply q1800    Assessment: 70 yo female here with anemia on coumadin PTA for hx PE and DVT. Coumadin was on hold as outpatient with plans for cath on Monday but cath plans have been cancelled due to anemia.   INR trending down due to held doses.  -INR= 1.74, Hg up to 9.0 after PRBC  Home coumadin dose: 4.5 mg on Sunday, 3 mg all other days (last clinic visit 2/15)  Goal of Therapy:  INR 2-3 Monitor platelets by anticoagulation protocol: Yes    Plan:  -Repeat Coumadin 4.5mg  po today -Daily PT/INR  [employer], Pharm.D., BCPS Clinical Pharmacist Pager [phone] 07/07/2015 9:34 AM

## 2015-07-07 NOTE — Evaluation (Signed)
Physical Therapy Evaluation Patient Details Name: Nicole Gibbs MRN: [id number] DOB: [birth year] Today's Date: 07/07/2015   History of Present Illness  Pt is a 70 y/o F admitted after routine laboratories (for prior cardiac cath) revealed a Hgb of 7.1 but no obvious source of bleeding found.  Her diagnosis is chronic systolic HF in setting of worsening anemia along w/ tachycardia.  Her PMH includes PE, DVT Bil LEs, obesity, and w/ recent Lt wrist fx, currently in splint.  Clinical Impression  Pt admitted with above diagnosis. Pt currently with functional limitations due to the deficits listed below (see PT Problem List). Mr. Frierson requires min guard assist for safe transfer and ambulation due to instability.  She has a h/o falls w/ recent Lt wrist fx.  She will have 24/7 assist/supervision available from her daughter at d/c.  Recommending HHPT w/ a focus on addressing her balance impairments to prevent future fall. Pt will benefit from skilled PT to increase their independence and safety with mobility to allow discharge to the venue listed below.      Follow Up Recommendations Home health PT;Supervision for mobility/OOB (specifically to address balance impairments and AD training)    Equipment Recommendations  None recommended by PT    Recommendations for Other Services OT consult     Precautions / Restrictions Precautions Precautions: Fall Precaution Comments: Lt wrist splint Restrictions Weight Bearing Restrictions:  (pt unable to recall if she has WB restrictions for Lt UE) Other Position/Activity Restrictions: unclear, pt unable to confirm; encouraged avoiding WB until confirmed w/ MD      Mobility  Bed Mobility Overal bed mobility: Needs Assistance Bed Mobility: Supine to Sit;Sit to Supine     Supine to sit: Min guard Sit to supine: Min guard   General bed mobility comments: HOB and no use of railings to simulate home environment. Increased time and pt requires verbal cues for  technique to scoot hips all the way back on the bed before attempting to achieve supine.  Increased effort for supine<>sit.  Transfers Overall transfer level: Needs assistance Equipment used: None Transfers: Sit to/from Stand Sit to Stand: Min guard         General transfer comment: Min guard due to min instability noted w/ standing  Ambulation/Gait Ambulation/Gait assistance: Min guard Ambulation Distance (Feet): 60 Feet Assistive device: None;Rolling walker (2 wheeled) Gait Pattern/deviations: Step-through pattern;Decreased stride length   Gait velocity interpretation: Below normal speed for age/gender General Gait Details: Min instability walking normally and w/ high level balance activities w/ increased lateral weight shift to Lt. First 42 ft w/o AD, balance improved w/ use of RW (emphasized no WB through Lt UE but just to guide RW along)  Stairs            Wheelchair Mobility    Modified Rankin (Stroke Patients Only)       Balance Overall balance assessment: Needs assistance;History of Falls Sitting-balance support: No upper extremity supported;Feet supported Sitting balance-Leahy Scale: Good     Standing balance support: No upper extremity supported;During functional activity Standing balance-Leahy Scale: Fair Standing balance comment: Pt unable to don socks                             Pertinent Vitals/Pain Pain Assessment: No/denies pain    Home Living Family/patient expects to be discharged to:: Private residence Living Arrangements: Spouse/significant other;Children Available Help at Discharge: Family;Available 24 hours/day Type of Home: House (townhouse) Home Access:  Ramped entrance     Home Layout: Two level;Bed/bath upstairs (chair lift) Home Equipment: Walker - 2 wheels;Walker - standard;Other (comment);Wheelchair - Education officer, community - power;Electric scooter;Cane - single point;Bedside commode Additional Comments: Most equipment is  her husband's equipment. He has MS and is "bedridden" per pt.  He has an aide that assists 4 hrs/day 7days/wk, otherwise pt and her daughter are caring for him.    Prior Function Level of Independence: Independent         Comments: Pt reports she normally doesn't walk farther than 10 ft at a time due to SOB.  She is currently receiving HHPT for her Lt wrist 2x/wk.  Her PT encouraged her to use a cane but she has refused thus far.      Hand Dominance        Extremity/Trunk Assessment   Upper Extremity Assessment: LUE deficits/detail       LUE Deficits / Details: Lt wrist splint due to recent fall when stepping up onto curb w/ resultant fx   Lower Extremity Assessment: Generalized weakness      Cervical / Trunk Assessment: Kyphotic  Communication   Communication: No difficulties  Cognition Arousal/Alertness: Awake/alert Behavior During Therapy: WFL for tasks assessed/performed Overall Cognitive Status: Within Functional Limits for tasks assessed                      General Comments General comments (skin integrity, edema, etc.): Encouraged pt to get in touch w/ her MD regarding WB restrictions for Lt UE.  If WBAT then pt to use RW for increased stability in light of her recent falls.  If she continues to have WB restrictions then recommended using a cane, along w/ gait training from [employer].    Exercises        Assessment/Plan    PT Assessment Patient needs continued PT services  PT Diagnosis Difficulty walking;Generalized weakness   PT Problem List Decreased strength;Decreased range of motion;Decreased activity tolerance;Decreased balance;Decreased knowledge of use of DME;Decreased safety awareness;Cardiopulmonary status limiting activity  PT Treatment Interventions DME instruction;Gait training;Functional mobility training;Therapeutic activities;Therapeutic exercise;Balance training;Patient/family education   PT Goals (Current goals can be found in the Care  Plan section) Acute Rehab PT Goals Patient Stated Goal: improve her balance PT Goal Formulation: With patient Time For Goal Achievement: 07/17/15 Potential to Achieve Goals: Good    Frequency Min 3X/week   Barriers to discharge        Co-evaluation               End of Session Equipment Utilized During Treatment: Gait belt Activity Tolerance: Patient limited by fatigue Patient left: in bed;with call bell/phone within reach;with nursing/sitter in room Nurse Communication: Mobility status         Time: [id number] PT Time Calculation (min) (ACUTE ONLY): 22 min   Charges:   PT Evaluation $PT Eval Low Complexity: 1 Procedure     PT G Codes:       Joslyn Hy PT, DPT [phone] Pager: [phone] 07/07/2015, 5:07 PM

## 2015-07-07 NOTE — Progress Notes (Signed)
Patient Name: Nicole Gibbs Date of Encounter: 07/07/2015  Primary Cardiologist: Dr. [name]   Principal Problem:   Symptomatic anemia Active Problems:   HTN (hypertension)   Obesity, unspecified   Dyspnea on exertion   Chronic combined systolic and diastolic CHF (congestive heart failure) (HCC)   Thrombocytosis (HCC)   Acute on chronic combined systolic and diastolic congestive heart failure, NYHA class 3 (HCC)   Systolic CHF, chronic ([place])    SUBJECTIVE  Denies any CP, still SOB and LE edema, she started to walk and feels better overall.  CURRENT MEDS . sodium chloride  10 mL/hr Intravenous Once  . ferrous sulfate  325 mg Oral BID WC  . furosemide  60 mg Intravenous BID  . lisinopril  2.5 mg Oral Daily  . metoprolol succinate  100 mg Oral Daily  . pantoprazole (PROTONIX) IV  40 mg Intravenous Q12H  . sodium chloride flush  3 mL Intravenous Q12H  . warfarin  4.5 mg Oral ONCE-1800  . Warfarin - Pharmacist Dosing Inpatient   Does not apply q1800    OBJECTIVE  Filed Vitals:   07/06/15 1344 07/06/15 2046 07/07/15 0513 07/07/15 1342  BP: 100/55 102/68 115/68 115/71  Pulse: 64 69 75 85  Temp: 97.8 F (36.6 C) 97.6 F (36.4 C) 97.7 F (36.5 C) 98 F (36.7 C)  TempSrc: Oral Oral Oral Oral  Resp: 18 20 18 15   Height:      Weight:   202 lb 9.6 oz (91.899 kg)   SpO2: 98% 94% 94% 98%    Intake/Output Summary (Last 24 hours) at 07/07/15 1403 Last data filed at 07/07/15 1344  Gross per 24 hour  Intake   1200 ml  Output   2275 ml  Net  -1075 ml   Filed Weights   07/05/15 0350 07/06/15 0500 07/07/15 0513  Weight: 205 lb 14.4 oz (93.396 kg) 205 lb 3.2 oz (93.078 kg) 202 lb 9.6 oz (91.899 kg)    PHYSICAL EXAM  General: Pleasant, NAD. Neuro: Alert and oriented X 3. Moves all extremities spontaneously. Psych: Normal affect. HEENT:  Normal  Neck: Supple without bruits or JVD. Lungs:  Resp regular and unlabored. Mild intermittent R basilar rale, otherwise largely  CTA Heart: RRR no s3, s4, or murmurs. Abdomen: Soft, non-tender, non-distended, BS + x 4.  Extremities: No clubbing, cyanosis. DP/PT/Radials 2+ and equal bilaterally. 2+ nonpitting edema  Accessory Clinical Findings  CBC  Recent Labs  07/05/15 0355 07/07/15 0504  WBC 7.1  --   HGB 9.0* 8.9*  HCT 32.7*  31.8* 31.9*  MCV 70.9*  --   PLT 475*  --    Basic Metabolic Panel  Recent Labs  07/06/15 0623 07/07/15 0504  NA 142 143  K 4.1 3.3*  CL 108 104  CO2 23 28  GLUCOSE 106* 95  BUN 16 15  CREATININE 0.80 0.84  CALCIUM 8.9 8.3*  MG 1.9  --    Liver Function Tests No results for input(s): AST, ALT, ALKPHOS, BILITOT, PROT, ALBUMIN in the last 72 hours.  TELE NSR with PACs, largely rate controlled.     ECG  No new EKG  Echocardiogram 06/29/2015  LV EF: 35% -  40%  ------------------------------------------------------------------- Indications:   Dyspnea (R06.00).  ------------------------------------------------------------------- History:  PMH: Edema, Pulmonary Embolism, Deep Vein Thrombosis. Risk factors: Family history of coronary artery disease. Hypertension.  ------------------------------------------------------------------- Study Conclusions  - Left ventricle: Wall thickness was increased in a pattern of mild LVH. Systolic function was moderately  reduced. The estimated ejection fraction was in the range of 35% to 40%. - Aortic valve: There was trivial regurgitation. - Mitral valve: Calcified annulus. Moderately thickened leaflets . There was moderate regurgitation. - Left atrium: The atrium was moderately dilated. - Right ventricle: The cavity size was mildly dilated. Systolic function was mildly reduced.     Radiology/Studies  Dg Chest 2 View  07/05/2015  CLINICAL DATA:  Newly diagnosed heart failure on chronic anticoagulation. Anemia. Exertional dyspnea 1 year. EXAM: CHEST  2 VIEW COMPARISON:  07/03/2015 and 05/09/2015  FINDINGS: Lungs are somewhat hypoinflated without lobar consolidation or effusion. Minimal linear atelectasis/ scarring over the left mid to lower lung. Minimal prominence of the perihilar markings suggesting mild vascular congestion. Mild stable cardiomegaly. Degenerative change of the spine. IMPRESSION: Mild stable cardiomegaly with minimal vascular congestion. Electronically Signed   By: Marin Olp M.D.   On: 07/05/2015 13:51   Dg Chest Portable 1 View  07/03/2015  CLINICAL DATA:  Anemia.  Shortness of breath, fatigue. EXAM: PORTABLE CHEST 1 VIEW COMPARISON:  05/09/2015 FINDINGS: There is cardiac enlargement. Pulmonary vascular congestion is noted. No pleural effusion or edema. No airspace consolidation identified. IMPRESSION: 1. Cardiac enlargement and pulmonary vascular congestion. Electronically Signed   By: Kerby Moors M.D.   On: 07/03/2015 17:15    ASSESSMENT AND PLAN  1. Chronic systolic HF in the setting of worsening anemia  - fairly new diagnosis, LVEF 35-40% on echo 06/29/2015  - started on medical therapy, plans for ischemic evaluation with outpatient cath however this has been postponed due to anemia  - medical therapy with lisionpril 2.5mg  daily, lopressor 50mg  bid  - still fluid overloaded, I will increase lasix to 80 mg iv BID and replace K   2. Tachycardia  - tele shows MAT with elevated rates. Appears she was on lopressor 100mg  in AM and 50mg  at night at home, currently just on 50mg  daily  - she is on 50mg  TID of lopressor, technically can transition to 150mg  daily, however her SBP 110s, would want to avoid dropping it too low. Will transition to 100mg  daily of Toprol XL.   - MAT may be contributing to her ongoing DOE and potentially her LV systolic dysfunction.   3. History of PE  - on chronic coumadin  4. Recurrent anemia s/p transfusion: s/p EGD and colonoscopy on 05/08/2015, no obvious source of bleeding.   - anemia panel 07/03/2015, Iron 14, TIBC 454, Sat ratio 3.    Dorothy Spark, MD 07/07/2015

## 2015-07-07 NOTE — Progress Notes (Signed)
PATIENT DETAILS Name: Nicole Gibbs Age: 70 y.o. Sex: female Date of Birth: Sep 14, 1945 Admit Date: 07/03/2015 Admitting Physician Delfina Redwood, MD [id number] L, MD   Brief narrative:   70 year old female with newly diagnosed chronic systolic heart failure-history on chronic anticoagulation for large unprovoked VTE 2014-has been having ongoing microcytic anemia for the past few months. She had a negative EGD/colonoscopy in December 2016. She was admitted on 2/17 for exertional dyspnea with a hemoglobin level of 7.0.  Subjective:  Patient sitting in bed, no headache, no fever or chills, no chest or abdominal pain, no focal weakness, still has edema in her legs along with some exertional shortness of breath.  Assessment/Plan:   Symptomatic iron deficiency anemia:  Required 1 unit of packed RBC transfusion this admission with stable H&H, she had EGD and colonoscopy in December 2016, her case was discussed by the previous physician with Dr. Jessie Foot GI who wants to do a capsule endoscopy in the office. FOBT negative this admission, now placed on oral iron after IV on 07/04/2015, continue PPI, suspect some cardiac involvement with shortness of breath as well. Cardiology requested to evaluate and following, continue diuresis.  Mild acute on chronic Chronic systolic heart failure EF close to 35%: Newly diagnosed-has had cardiology follow-up as outpatient-per cardiology, will continue IV Lasix and add a dose of Zaroxolyn along with TED stockings for fluid mobilization, left heart catheterization post discharged as outpatient per cards, Continue beta blocker and ACE inhibitor.  Hypokalemia: Replaced, monitor.  History of DVT/PE 2014: Per patient she has been advised by prior physicians that she needs lifelong anticoagulation. Resumed Coumadin-no evidence of active bleeding at this time. Will ask pharmacy to dose while inpatient. Patient will need follow-up with  hematology as an outpatient.  Thrombocytosis: Likely secondary to iron deficiency anemia.  Runs of sinus tachycardia with MAT. Seen by cardiology, beta blocker dose adjusted, monitor.    Disposition: Remain inpatient-home 2/19 if CBC stable.  Antimicrobial agents  See below  Anti-infectives    None      DVT Prophylaxis: Coumadin  Lab Results  Component Value Date   INR 1.74* 07/07/2015   INR 1.79* 07/06/2015   INR 2.05* 07/05/2015    Code Status: Full code   Family Communication None at bedside  Procedures: None  CONSULTS:   Cards  Time spent 25 minutes-Greater than 50% of this time was spent in counseling, explanation of diagnosis, planning of further management, and coordination of care.  MEDICATIONS: Scheduled Meds: . sodium chloride  10 mL/hr Intravenous Once  . ferrous sulfate  325 mg Oral BID WC  . furosemide  60 mg Intravenous BID  . lisinopril  2.5 mg Oral Daily  . metolazone  2.5 mg Oral Once  . metoprolol succinate  100 mg Oral Daily  . pantoprazole (PROTONIX) IV  40 mg Intravenous Q12H  . potassium chloride  40 mEq Oral Q4H  . sodium chloride flush  3 mL Intravenous Q12H  . warfarin  4.5 mg Oral ONCE-1800  . Warfarin - Pharmacist Dosing Inpatient   Does not apply q1800   Continuous Infusions:  PRN Meds:.acetaminophen **OR** acetaminophen, diphenhydrAMINE, morphine injection, [DISCONTINUED] ondansetron **OR** ondansetron (ZOFRAN) IV, zolpidem    PHYSICAL EXAM: Vital signs in last 24 hours: Filed Vitals:   07/06/15 0500 07/06/15 1344 07/06/15 2046 07/07/15 0513  BP: 113/65 100/55 102/68 115/68  Pulse: 91 64 69 75  Temp: 98.1  F (36.7 C) 97.8 F (36.6 C) 97.6 F (36.4 C) 97.7 F (36.5 C)  TempSrc:  Oral Oral Oral  Resp: 22 18 20 18   Height:      Weight: 93.078 kg (205 lb 3.2 oz)   91.899 kg (202 lb 9.6 oz)  SpO2: 90% 98% 94% 94%    Weight change: -1.179 kg (-2 lb 9.6 oz) Filed Weights   07/05/15 0350 07/06/15 0500 07/07/15  0513  Weight: 93.396 kg (205 lb 14.4 oz) 93.078 kg (205 lb 3.2 oz) 91.899 kg (202 lb 9.6 oz)   Body mass index is 37.05 kg/(m^2).   Gen Exam: Awake and alert with clear speech.   Neck: Supple, No JVD.   Chest: few rales.   CVS: S1 S2 Regular, no murmurs.  Abdomen: soft, BS +, non tender, non distended.  Extremities: 2+ edema, lower extremities warm to touch. Neurologic: Non Focal.   Skin: No Rash.   Wounds: N/A.   Intake/Output from previous day:  Intake/Output Summary (Last 24 hours) at 07/07/15 0947 Last data filed at 07/07/15 0515  Gross per 24 hour  Intake    960 ml  Output   1925 ml  Net   -965 ml     LAB RESULTS: CBC  Recent Labs Lab 07/01/15 1213 07/03/15 1313 07/04/15 0445 07/05/15 0355 07/07/15 0504  WBC 8.0 6.1 6.7 7.1  --   HGB 7.1* 7.0* 7.5* 9.0* 8.9*  HCT 26.8* 27.0* 27.6* 32.7*  31.8* 31.9*  PLT 612* 537* 466* 475*  --   MCV 67.7* 69.2* 70.4* 70.9*  --   MCH 17.9* 17.9* 19.1* 19.5*  --   MCHC 26.5* 25.9* 27.2* 27.5*  --   RDW 22.4* 22.1* 22.8* 22.4*  --     Chemistries   Recent Labs Lab 07/03/15 1313 07/04/15 0445 07/05/15 0355 07/06/15 0623 07/07/15 0504  NA 143 143 145 142 143  K 3.7 3.3* 3.3* 4.1 3.3*  CL 105 105 108 108 104  CO2 26 26 27 23 28   GLUCOSE 101* 88 93 106* 95  BUN 13 13 14 16 15   CREATININE 0.66 0.76 0.91 0.80 0.84  CALCIUM 8.9 8.6* 8.7* 8.9 8.3*  MG  --   --   --  1.9  --     CBG: No results for input(s): GLUCAP in the last 168 hours.  GFR Estimated Creatinine Clearance: 68.5 mL/min (by C-G formula based on Cr of 0.84).  Coagulation profile  Recent Labs Lab 07/03/15 1313 07/04/15 1032 07/05/15 0355 07/06/15 0623 07/07/15 0504  INR 2.46* 2.22* 2.05* 1.79* 1.74*    Cardiac Enzymes No results for input(s): CKMB, TROPONINI, MYOGLOBIN in the last 168 hours.  Invalid input(s): CK  Invalid input(s): POCBNP No results for input(s): DDIMER in the last 72 hours. No results for input(s): HGBA1C in the last  72 hours. No results for input(s): CHOL, HDL, LDLCALC, TRIG, CHOLHDL, LDLDIRECT in the last 72 hours. No results for input(s): TSH, T4TOTAL, T3FREE, THYROIDAB in the last 72 hours.  Invalid input(s): FREET3  Recent Labs  07/05/15 0355  VITAMINB12 338  RETICCTPCT 1.9   No results for input(s): LIPASE, AMYLASE in the last 72 hours.  Urine Studies No results for input(s): UHGB, CRYS in the last 72 hours.  Invalid input(s): UACOL, UAPR, USPG, UPH, UTP, UGL, UKET, UBIL, UNIT, UROB, ULEU, UEPI, UWBC, URBC, UBAC, CAST, UCOM, BILUA  MICROBIOLOGY: No results found for this or any previous visit (from the past 240 hour(s)).  RADIOLOGY STUDIES/RESULTS:  Dg Chest 2 View  07/05/2015  CLINICAL DATA:  Newly diagnosed heart failure on chronic anticoagulation. Anemia. Exertional dyspnea 1 year. EXAM: CHEST  2 VIEW COMPARISON:  07/03/2015 and 05/09/2015 FINDINGS: Lungs are somewhat hypoinflated without lobar consolidation or effusion. Minimal linear atelectasis/ scarring over the left mid to lower lung. Minimal prominence of the perihilar markings suggesting mild vascular congestion. Mild stable cardiomegaly. Degenerative change of the spine. IMPRESSION: Mild stable cardiomegaly with minimal vascular congestion. Electronically Signed   By: Marin Olp M.D.   On: 07/05/2015 13:51   Dg Chest Portable 1 View  07/03/2015  CLINICAL DATA:  Anemia.  Shortness of breath, fatigue. EXAM: PORTABLE CHEST 1 VIEW COMPARISON:  05/09/2015 FINDINGS: There is cardiac enlargement. Pulmonary vascular congestion is noted. No pleural effusion or edema. No airspace consolidation identified. IMPRESSION: 1. Cardiac enlargement and pulmonary vascular congestion. Electronically Signed   By: Kerby Moors M.D.   On: 07/03/2015 17:15   Signature  Thurnell Lose M.D on 07/07/2015 at 9:47 AM  Between 7am to 7pm - Pager - [phone], After 7pm go to [email or web address] - password [email or web address]   [phone]   LOS: 4 days

## 2015-07-07 NOTE — Plan of Care (Signed)
Problem: Fluid Volume: Goal: Ability to maintain a balanced intake and output will improve Outcome: Completed/Met Date Met:  07/07/15 Pt educated on fluid restriction. Pt verbalized understanding. Monitoring input and output.

## 2015-07-07 NOTE — Research (Signed)
[email or web address]  Study Informed Consent   Subject Name: Nicole Gibbs  Subject met inclusion and exclusion criteria.  The informed consent form, study requirements and expectations were reviewed with the subject and questions and concerns were addressed prior to the signing of the consent form.  The subject verbalized understanding of the trail requirements.  The subject agreed to participate in the [email or web address]  trial and signed the informed consent.  The informed consent was obtained prior to performance of any protocol-specific procedures for the subject.  A copy of the signed informed consent was given to the subject and a copy was placed in the subject's medical record.  Sandie Ano 07/07/2015, 17:40

## 2015-07-07 NOTE — Care Management Important Message (Signed)
Important Message  Patient Details  Name: Nicole Gibbs MRN: [id number] Date of Birth: 1946-04-07   Medicare Important Message Given:  Yes    [name] May 07/07/2015, 2:25 PM

## 2015-07-07 NOTE — Consult Note (Signed)
Referral received from inpatient case manager, new diagnosis of heart failure for post hospital discharge follow up. Patient was evaluated for community based chronic disease management services with [facility] care Management Program as a benefit of patient's Medicare. Met with the patient at the bedside to explain [place] Management services. Patient endorses her primary care provider to be Dr. [name]. Patient states she was previously receiving [facility] services from [facility] for physical therapy. Consent form signed. Patient will receive post hospital discharge calls and be evaluated for monthly home visits. Left contact information and [facility] literature at the bedside. Made inpatient RNCM aware that [facility] will be following for care management. For additional questions please contact:   Kannon Granderson RN, [facility] Liaison  [phone]) [place] free office [phone])

## 2015-07-07 NOTE — Progress Notes (Signed)
Heart Failure Navigator Consult Note  Presentation: Nicole Gibbs is a very pleasant 70 y.o. female with a past medical history that includes hypertension, bilateral PE and DVT on Coumadin, CHF, symptomatic anemia, obesity, presents to the emergency department instructions of her cardiologist for hemoglobin of 7.1. Initial evaluation reveals symptomatic anemia, thrombocytosis mild hypotension.  Information is obtained from the patient. He reports being in the process of outpatient workup for cardiac cath that is scheduled for Monday. Process lab work revealed low hemoglobin she was instructed by her cardiologist to go see gastroenterologist. She saw Dr. Oletta Lamas who reportedly did a rectal exam and fecal occult was negative. and was sent to the emergency department. Of note she was in the hospital at late December for the same at which time she had an EGD and a colonoscopy that were within the limits of normal. Since she was discharged in late December she has continued to experience chronic fatigue shortness of breath with exertion some lightheadedness. She denies fever chill syncope or near-syncope. She denies chest pain palpitations abdominal pain nausea vomiting dysuria hematuria frequency or urgency. She denies NSAID use she is on Coumadin for history of bilateral PE and DVT. He denies any recent falls the emergency department she is afebrile .   Past Medical History  Diagnosis Date  . Thyroid nodule     no meds  . Hypertensive heart disease   . Pulmonary embolism, bilateral (HCC)     a. chronic coumadin.  . DVT, bilateral lower limbs ([place])   . Arthritis     knees  . Postmenopausal vaginal bleeding   . Rash     under breasts  . Nocturia   . Obese   . Chronic combined systolic and diastolic CHF (congestive heart failure) ([place])     a. 07/2012 Echo: EF 55-60%, Gr1 DD;  b. 06/2015 Echo: EF 35-40%, triv AI, Mod MR, mod dil LA, mildly reduced RV.  . H/O cardiovascular stress test     a. 07/2012  MV: fixed mid-dist anterior and apical defect - scar vs attenuation, distal ant HK, no reversibility, EF 51%-->low risk.    Social History   Social History  . Marital Status: Married    Spouse Name: N/A  . Number of Children: N/A  . Years of Education: N/A   Social History Main Topics  . Smoking status: Never Smoker   . Smokeless tobacco: Never Used  . Alcohol Use: No  . Drug Use: No  . Sexual Activity: Not Asked   Other Topics Concern  . None   Social History Narrative    ECHO:Study Conclusions--06/29/15  - Left ventricle: Wall thickness was increased in a pattern of mild LVH. Systolic function was moderately reduced. The estimated ejection fraction was in the range of 35% to 40%. - Aortic valve: There was trivial regurgitation. - Mitral valve: Calcified annulus. Moderately thickened leaflets . There was moderate regurgitation. - Left atrium: The atrium was moderately dilated. - Right ventricle: The cavity size was mildly dilated. Systolic function was mildly reduced.  Transthoracic echocardiography. M-mode, complete 2D, spectral Doppler, and color Doppler. Birthdate: Patient birthdate: 05/28/1945. Age: Patient is 70 yr old. Sex: Gender: female. BMI: 41.5 kg/m^2. Blood pressure:   108/76 Patient status: Outpatient. Study date: Study date: 06/29/2015. Study time: 11:06 AM. Location: Echo laboratory.  BNP    Component Value Date/Time   BNP 811.5* 07/05/2015 0355    ProBNP No results found for: PROBNP   Education Assessment and Provision:  Detailed education  and instructions provided on heart failure disease management including the following:  Signs and symptoms of Heart Failure When to call the physician Importance of daily weights Low sodium diet Fluid restriction Medication management Anticipated future follow-up appointments  Patient education given on each of the above topics.  Patient acknowledges understanding and acceptance  of all instructions.  I spoke at length with Ms. Flaugher regarding her new diagnosis of HF.  She admits that she is quite overwhelmed with all the new information.  She lives in [place] with her husband and 17 year old daughter.  Her husband has MS and they have an aide that is in the home 4 hours each day 7 days a week to assist with his care.  She tells me that recently they have both been "home bound".  She broke her arm after a hospitalization in December.  She does have a scale at home and can weigh daily.  We discussed the importance of daily weights and how weight increases relate to the signs and symptoms of HF.  We discussed a low sodium diet and high sodium food to avoid.  She admits that they eat "fast food a lot"--"[employer]" and "[employer]".  She also admits that avoiding high sodium foods will be difficult for her.  She denies any issues with getting or taking prescribed medications.  She will follow with [facility].  Education Materials:  "Living Better With Heart Failure" Booklet, Daily Weight Tracker Tool   High Risk Criteria for Readmission and/or Poor Patient Outcomes:   EF <30%- 35-40%  2 or more admissions in 6 months- Yes 2/6  Difficult social situation- Yes-lives with husband who has MS--? Ability to provide self care  Demonstrates medication noncompliance- No denies    Barriers of Care:  New HF-Knowledge and compliance   Discharge Planning:   Plans to return to home with husband and daughter.  She will need HH for ongoing education, compliance reinforcement and symptom recognition.  She currently has Home care provided with [facility].

## 2015-07-08 DIAGNOSIS — R0609 Other forms of dyspnea: Secondary | ICD-10-CM

## 2015-07-08 DIAGNOSIS — R0600 Dyspnea, unspecified: Secondary | ICD-10-CM | POA: Insufficient documentation

## 2015-07-08 LAB — BASIC METABOLIC PANEL
ANION GAP: 12 (ref 5–15)
BUN: 15 mg/dL (ref 6–20)
CO2: 28 mmol/L (ref 22–32)
Calcium: 8.8 mg/dL — ABNORMAL LOW (ref 8.9–10.3)
Chloride: 102 mmol/L (ref 101–111)
Creatinine, Ser: 0.71 mg/dL (ref 0.44–1.00)
GFR calc Af Amer: >60 mL/min (ref >60)
Glucose, Bld: 93 mg/dL (ref 65–99)
POTASSIUM: 3.7 mmol/L (ref 3.5–5.1)
SODIUM: 142 mmol/L (ref 135–145)

## 2015-07-08 LAB — PROTIME-INR
INR: 1.82 — AB (ref 0.00–1.49)
Prothrombin Time: 21.1 seconds — ABNORMAL HIGH (ref 11.6–15.2)

## 2015-07-08 LAB — MAGNESIUM: MAGNESIUM: 1.8 mg/dL (ref 1.7–2.4)

## 2015-07-08 MED ORDER — POTASSIUM CHLORIDE CRYS ER 20 MEQ PO TBCR: 40.0000 meq | EXTENDED_RELEASE_TABLET | Freq: Two times a day (BID) | ORAL | Status: DC

## 2015-07-08 MED ORDER — FERROUS SULFATE 325 (65 FE) MG PO TABS: 325.0000 mg | ORAL_TABLET | Freq: Two times a day (BID) | ORAL | Status: DC

## 2015-07-08 MED ORDER — WARFARIN SODIUM 5 MG PO TABS
6.0000 mg | ORAL_TABLET | Freq: Once | ORAL | Status: AC
  Administered 2015-07-08: 6 mg via ORAL
  Filled 2015-07-08: qty 1

## 2015-07-08 MED ORDER — FUROSEMIDE 80 MG PO TABS
80.0000 mg | ORAL_TABLET | Freq: Two times a day (BID) | ORAL | Status: DC
  Administered 2015-07-08 – 2015-07-09 (×2): 80 mg via ORAL
  Filled 2015-07-08 (×2): qty 1

## 2015-07-08 MED ORDER — FUROSEMIDE 80 MG PO TABS: 80.0000 mg | ORAL_TABLET | Freq: Two times a day (BID) | ORAL | Status: DC

## 2015-07-08 MED ORDER — METOPROLOL SUCCINATE ER 100 MG PO TB24: 100.0000 mg | ORAL_TABLET | Freq: Every day | ORAL | Status: DC

## 2015-07-08 MED ORDER — WARFARIN SODIUM 2.5 MG PO TABS: 4.5000 mg | ORAL_TABLET | Freq: Once | ORAL | Status: DC

## 2015-07-08 MED ORDER — PANTOPRAZOLE SODIUM 40 MG PO TBEC: 40.0000 mg | DELAYED_RELEASE_TABLET | Freq: Two times a day (BID) | ORAL | Status: DC

## 2015-07-08 MED ORDER — PANTOPRAZOLE SODIUM 40 MG PO TBEC
40.0000 mg | DELAYED_RELEASE_TABLET | Freq: Two times a day (BID) | ORAL | Status: DC
  Administered 2015-07-08 – 2015-07-10 (×5): 40 mg via ORAL
  Filled 2015-07-08 (×5): qty 1

## 2015-07-08 NOTE — Progress Notes (Signed)
Physical Therapy Treatment Patient Details Name: Nicole Gibbs MRN: [id number] DOB: 1946-02-22 Today's Date: 07/08/2015    History of Present Illness Pt is a 70 y/o F admitted after routine labs (for prior cardiac cath) revealed a Hgb of 7.1 but no obvious source of bleeding found.  Her diagnosis is chronic systolic HF in setting of worsening anemia along w/ tachycardia.  Her PMH includes PE, DVT Bil LEs, obesity, and w/ recent Lt wrist fx, currently in splint.    PT Comments    Ms. Scheerer demonstrates improved stability w/ introduction of cane w/ ambulation today.  She continues to fatigue quickly.  Pt encouraged to ambulate in hallway 3x/day w/ nursing staff.   Follow Up Recommendations  Home health PT;Supervision for mobility/OOB (specifically to address balance impairments and AD training)     Equipment Recommendations  None recommended by PT    Recommendations for Other Services OT consult     Precautions / Restrictions Precautions Precautions: Fall Precaution Comments: Lt wrist splint Restrictions Weight Bearing Restrictions:  (pt unable to recall if she has WB restrictions for Lt UE) Other Position/Activity Restrictions: unclear, pt unable to confirm; encouraged avoiding WB until confirmed w/ MD    Mobility  Bed Mobility               General bed mobility comments: Pt sitting EOB upon PT arrival  Transfers Overall transfer level: Needs assistance Equipment used: Straight cane Transfers: Sit to/from Stand Sit to Stand: Min guard         General transfer comment: Pt slow to stand due to fatigue, cues for proper technique using cane  Ambulation/Gait Ambulation/Gait assistance: Min guard Ambulation Distance (Feet): 60 Feet Assistive device: Straight cane Gait Pattern/deviations: Step-through pattern;Decreased stride length   Gait velocity interpretation: Below normal speed for age/gender General Gait Details: Min instability due to fatigue toward end of  ambulatory distance.  Balance improved w/ introduction of cane today and pt encouraged to begin using this at home.  Pt requires 1 standing rest break due to fatigue.   Stairs            Wheelchair Mobility    Modified Rankin (Stroke Patients Only)       Balance Overall balance assessment: Needs assistance Sitting-balance support: No upper extremity supported;Feet supported Sitting balance-Leahy Scale: Fair     Standing balance support: Single extremity supported;During functional activity Standing balance-Leahy Scale: Fair                      Cognition Arousal/Alertness: Awake/alert Behavior During Therapy: WFL for tasks assessed/performed Overall Cognitive Status: Within Functional Limits for tasks assessed                      Exercises Total Joint Exercises Ankle Circles/Pumps: AROM;Both;Seated;10 reps Other Exercises Other Exercises: manually resisted Bil knee flexion/extension x10    General Comments        Pertinent Vitals/Pain Pain Assessment: No/denies pain    Home Living                      Prior Function            PT Goals (current goals can now be found in the care plan section) Acute Rehab PT Goals Patient Stated Goal: to get stronger PT Goal Formulation: With patient Time For Goal Achievement: 07/17/15 Potential to Achieve Goals: Good Progress towards PT goals: Progressing toward goals    Frequency  Min 3X/week    PT Plan Current plan remains appropriate    Co-evaluation             End of Session Equipment Utilized During Treatment: Gait belt Activity Tolerance: Patient limited by fatigue Patient left: with call bell/phone within reach;in chair     Time: [id number] PT Time Calculation (min) (ACUTE ONLY): 16 min  Charges:  $Gait Training: 8-22 mins                    G Codes:      Joslyn Hy PT, [name] [id number] Pager: [phone] 07/08/2015, 10:22 AM

## 2015-07-08 NOTE — Progress Notes (Signed)
Patient Name: Nicole Gibbs Date of Encounter: 07/08/2015  Primary Cardiologist: Dr. [name]   Principal Problem:   Symptomatic anemia Active Problems:   HTN (hypertension)   Obesity, unspecified   Dyspnea on exertion   Chronic combined systolic and diastolic CHF (congestive heart failure) (HCC)   Thrombocytosis (HCC)   Acute on chronic combined systolic and diastolic congestive heart failure, NYHA class 3 (HCC)   Systolic CHF, chronic ([place])    SUBJECTIVE  She is feeling better overall and requesting to go home today. I spent over 20 minutes in the room with the patient discussing heart failure and our current plans. This is overall very complicated due to her recurrent anemia requiring blood transfusions. She understands that she will need to be transitioned to by mouth Lasix before she is able to go home.  CURRENT MEDS . sodium chloride  10 mL/hr Intravenous Once  . ferrous sulfate  325 mg Oral BID WC  . furosemide  80 mg Intravenous BID  . lisinopril  2.5 mg Oral Daily  . metolazone  2.5 mg Oral Daily  . metoprolol succinate  100 mg Oral Daily  . pantoprazole  40 mg Oral BID  . potassium chloride  40 mEq Oral BID  . sodium chloride flush  3 mL Intravenous Q12H  . warfarin  6 mg Oral ONCE-1800  . Warfarin - Pharmacist Dosing Inpatient   Does not apply q1800    OBJECTIVE  Filed Vitals:   07/07/15 0513 07/07/15 1342 07/07/15 2002 07/08/15 0403  BP: 115/68 115/71 116/96 102/53  Pulse: 75 85 91 82  Temp: 97.7 F (36.5 C) 98 F (36.7 C) 97.7 F (36.5 C) 99.1 F (37.3 C)  TempSrc: Oral Oral Oral Oral  Resp: 18 15 18 16   Height: 5\' 2"  (1.575 m)     Weight: 202 lb 9.6 oz (91.899 kg)   197 lb 1.6 oz (89.404 kg)  SpO2: 94% 98% 96% 87%    Intake/Output Summary (Last 24 hours) at 07/08/15 0936 Last data filed at 07/08/15 0400  Gross per 24 hour  Intake    480 ml  Output   2700 ml  Net  -2220 ml   Filed Weights   07/06/15 0500 07/07/15 0513 07/08/15 0403  Weight: 205  lb 3.2 oz (93.078 kg) 202 lb 9.6 oz (91.899 kg) 197 lb 1.6 oz (89.404 kg)    PHYSICAL EXAM  General: Pleasant, NAD. Obese Neuro: Alert and oriented X 3. Moves all extremities spontaneously. Psych: Normal affect. HEENT:  Normal  Neck: Supple without bruits or JVD. Lungs:  Resp regular and unlabored. Crackles at bases Heart: RRR no s3, s4, or murmurs. Abdomen: Soft, non-tender, non-distended, BS + x 4.  Extremities: No clubbing, cyanosis. DP/PT/Radials 2+ and equal bilaterally. 1+ nonpitting edema  Accessory Clinical Findings  CBC  Recent Labs  07/07/15 0504  HGB 8.9*  HCT [id number]*   Basic Metabolic Panel  Recent Labs  07/06/15 0623 07/07/15 0504 07/08/15 0601  NA 142 143 142  K 4.1 3.3* 3.7  CL 108 104 102  CO2 23 28 28   GLUCOSE 106* 95 93  BUN 16 15 15   CREATININE 0.80 0.84 0.71  CALCIUM 8.9 8.3* 8.8*  MG 1.9  --  1.8   Liver Function Tests No results for input(s): AST, ALT, ALKPHOS, BILITOT, PROT, ALBUMIN in the last 72 hours.  TELE NSR with PACs, HR tachy    ECG  No new EKG  Echocardiogram 06/29/2015  LV EF: 35% -  40% Study Conclusions - Left ventricle: Wall thickness was increased in a pattern of mild LVH. Systolic function was moderately reduced. The estimated ejection fraction was in the range of 35% to 40%. - Aortic valve: There was trivial regurgitation. - Mitral valve: Calcified annulus. Moderately thickened leaflets . There was moderate regurgitation. - Left atrium: The atrium was moderately dilated. - Right ventricle: The cavity size was mildly dilated. Systolic function was mildly reduced.     Radiology/Studies  Dg Chest 2 View  07/05/2015  CLINICAL DATA:  Newly diagnosed heart failure on chronic anticoagulation. Anemia. Exertional dyspnea 1 year. EXAM: CHEST  2 VIEW COMPARISON:  07/03/2015 and 05/09/2015 FINDINGS: Lungs are somewhat hypoinflated without lobar consolidation or effusion. Minimal linear atelectasis/ scarring over  the left mid to lower lung. Minimal prominence of the perihilar markings suggesting mild vascular congestion. Mild stable cardiomegaly. Degenerative change of the spine. IMPRESSION: Mild stable cardiomegaly with minimal vascular congestion. Electronically Signed   By: Marin Olp M.D.   On: 07/05/2015 13:51   Dg Chest Portable 1 View  07/03/2015  CLINICAL DATA:  Anemia.  Shortness of breath, fatigue. EXAM: PORTABLE CHEST 1 VIEW COMPARISON:  05/09/2015 FINDINGS: There is cardiac enlargement. Pulmonary vascular congestion is noted. No pleural effusion or edema. No airspace consolidation identified. IMPRESSION: 1. Cardiac enlargement and pulmonary vascular congestion. Electronically Signed   By: Kerby Moors M.D.   On: 07/03/2015 17:15    ASSESSMENT AND PLAN  Ms. [name] is a 70 y.o.female history of chronic combined systolic HF with LVEF 123456 by echo 06/2015, history of PE on chronic coumadin, HTN admitted with symptomatic anemia. From notes she had a prior admission a few months ago also requriring transfusion with negative upper and lower endoscopy. Seen recently in clinic by Ignacia Bayley NP 07/01/15 with 68increased LE edema, abdominal distension. Lasix increased to 40mg  bid, started on lisionpril 2.5mg  daily. She was arrange for [facility] as outpatient that is scheduled for 07/06/15 with Dr [name]. This was cancelled given her recurrent issues with anemia.   1. Chronic systolic HF in the setting of worsening anemia  - fairly new diagnosis, LVEF 35-40% on echo 06/29/2015  - started on medical therapy, plans for ischemic evaluation with outpatient cath however this has been postponed due to anemia  - medical therapy with lisionpril 2.5mg  daily and Toprol XL 100mg  daily  -currently on IV Lasix 80 mg twice a day as well as metolazone 2.5 mg daily. She is net -5.9 L and her weight is down 7 lbs (204 --> 197). Her creatinine continues to improve and she remains volume overloaded.   - she will eventually need a  left and right heart cath, but this has been postponed due to ongoing issues of anemia without obvious etiology  2. Tachycardia  - tele shows sinus tach with PACs vs MAT and PVCs  - currently on Toprol XL 100mg  daily   - MAT may be contributing to her ongoing DOE and potentially her LV systolic dysfunction.   3. History of PE  - on chronic coumadin. Managed by pharmacy.  4. Recurrent anemia s/p transfusion: s/p EGD and colonoscopy on 05/08/2015, no obvious source of bleeding.   - anemia panel 07/03/2015, Iron 14, TIBC 454, Sat ratio 3. H/H: 8.9/31.9. Continue iron supplementation.  Eileen Stanford, MD 07/08/2015  The patient was seen, examined and discussed with Jory Sims, NP and I agree with the above.   The patient is still fluid overloaded, we will switch to 80  mg po BID (from lasix 40 mg po bid previously and metolazone 2.5 mg po daily) and plan on discharge tomorrow.  Continue working with physical therapy.  Dorothy Spark 07/08/2015

## 2015-07-08 NOTE — Discharge Summary (Addendum)
Nicole Gibbs, is a 70 y.o. female  DOB [birth year]  MRN [id number].  Admission date:  07/03/2015  Admitting Physician  Delfina Redwood, MD  Discharge Date:  07/10/2015   Primary MD  [name], MD  Recommendations for primary care physician for things to follow:   Monitor weight, BMP, CBC, INR and diuretic dose closely.  Outpatient GI and cardiology follow-up.  Patient needs  hematology follow-up for duration and course of anticoagulation.   Admission Diagnosis  Dyspnea on exertion [R06.09] Symptomatic anemia [D64.9]   Discharge Diagnosis  Dyspnea on exertion [R06.09] Symptomatic anemia [D64.9]     Principal Problem:   Symptomatic anemia Active Problems:   HTN (hypertension)   Obesity, unspecified   Dyspnea on exertion   Chronic combined systolic and diastolic CHF (congestive heart failure) (HCC)   Thrombocytosis (HCC)   Acute on chronic combined systolic and diastolic congestive heart failure, NYHA class 3 (HCC)   Systolic CHF, chronic (HCC)   Dyspnea on exertion      Past Medical History  Diagnosis Date  . Thyroid nodule     no meds  . Hypertensive heart disease   . Pulmonary embolism, bilateral (HCC)     a. chronic coumadin.  . DVT, bilateral lower limbs ([place])   . Arthritis     knees  . Postmenopausal vaginal bleeding   . Rash     under breasts  . Nocturia   . Obese   . Chronic combined systolic and diastolic CHF (congestive heart failure) ([place])     a. 07/2012 Echo: EF 55-60%, Gr1 DD;  b. 06/2015 Echo: EF 35-40%, triv AI, Mod MR, mod dil LA, mildly reduced RV.  . H/O cardiovascular stress test     a. 07/2012 MV: fixed mid-dist anterior and apical defect - scar vs attenuation, distal ant HK, no reversibility, EF 51%-->low risk.    Past Surgical History  Procedure Laterality Date    . Cesarean section  1992  . Hysteroscopy w/d&c  03-24-2009  . Dilation and curettage of uterus  06/20/2012    Procedure: DILATATION AND CURETTAGE;  Surgeon: [name], MD;  Location: [facility];  Service: Gynecology;  Laterality: N/A;  . Dilation and evacution  1988    missed ab  . Cardiovascular stress test  08-07-2012  DR HILTY/ DR BERRY    LOW RISK NUCLEAR STUDY/ NO ISCHEMIA/ FIXED MID TO DISTAL ANTERIOR AND APICAL DEFECT MAY REPRESENT SCAR VS BREAST ATTENUATION ARTIFACT/  MILD DISTAL ANTERIOR HYPOKINESIS/ EF 51%  . Transthoracic echocardiogram  08-08-2012    LVSF NORMAL/ EF [id number]  GRADE I DIASTOLIC DYSFUNCTION/ MILD AV AND MV REGURG./  RVSF MILDLY REDUCED  . Dilitation & currettage/hystroscopy with thermachoice ablation N/A 09/04/2012    Procedure: DILATATION & CURETTAGE WITH THERMACHOICE ABLATION;  Surgeon: [name], MD;  Location: [facility];  Service: Gynecology;  Laterality: N/A;  . Esophagogastroduodenoscopy N/A 05/07/2015    Procedure: ESOPHAGOGASTRODUODENOSCOPY (EGD);  Surgeon: Laurence Spates, MD;  Location: [facility];  Service: Endoscopy;  Laterality: N/A;  . Colonoscopy N/A 05/08/2015    Procedure: COLONOSCOPY;  Surgeon: Laurence Spates, MD;  Location: [facility];  Service: Endoscopy;  Laterality: N/A;       HPI  from the history and physical done on the day of admission:    Nicole Gibbs is a very pleasant 70 y.o. female with a past medical history that includes hypertension, bilateral PE and DVT on Coumadin, CHF, symptomatic anemia, obesity, presents to the emergency department instructions of her cardiologist for hemoglobin of 7.1. Initial evaluation reveals symptomatic anemia, thrombocytosis mild hypotension.  Information is obtained from the patient. He reports being in the process of outpatient workup for cardiac cath that is scheduled for Monday. Process lab work revealed low hemoglobin she was instructed by her cardiologist to go see  gastroenterologist. She saw Dr. Oletta Lamas who reportedly did a rectal exam and fecal occult was negative. and was sent to the emergency department. Of note she was in the hospital at late December for the same at which time she had an EGD and a colonoscopy that were within the limits of normal. Since she was discharged in late December she has continued to experience chronic fatigue shortness of breath with exertion some lightheadedness. She denies fever chill syncope or near-syncope. She denies chest pain palpitations abdominal pain nausea vomiting dysuria hematuria frequency or urgency. She denies NSAID use she is on Coumadin for history of bilateral PE and DVT. He denies any recent falls  In the emergency department she is afebrile hemodynamically stable and not hypoxic.     Hospital Course:     Symptomatic iron deficiency anemia: Required 1 unit of packed RBC transfusion this admission with stable H&H, she had EGD and colonoscopy in December 2016, her case was discussed by the previous physician with Dr. Jessie Foot GI who wants to do a capsule endoscopy in the office. FOBT negative this admission, received IV iron and will be placed on oral iron supplementation, continue PPI, suspect some cardiac involvement with shortness of breath as well. Will require close outpatient GI follow-up.  Mild acute on chronic Chronic systolic heart failure EF close to 35%: Newly diagnosed-has had cardiology follow-up as outpatient-per cardiology, she has been adequately diuresed, -9 L, she became orthostatic and hypotensive day before discharge and required IV fluids, have cut down her home Lasix to 40 twice a day and her beta blocker dose to Toprol-XL 50 mg daily, continue low-dose ACE inhibitor. Request PCP to monitor blood pressure, fluid balance, BMP and diuretic dose closely. She has now ablated in the hallway, symptom-free will be discharged home.  Hypokalemia: Replaced, and stable, request PCP to continue  monitoring.  History of DVT/PE 2014: Per patient she has been advised by prior physicians that she needs lifelong anticoagulation. Resumed Coumadin-no evidence of active bleeding at this time.  Patient will need follow-up with hematology as an outpatient.  Thrombocytosis: Likely secondary to iron deficiency anemia.  Runs of sinus tachycardia with MAT. Seen by cardiology, beta blocker dose adjusted, monitor by PCP.   Discharge Condition: Stable  Follow UP  Follow-up Information    Follow up with [facility].   Why:  Registered Nurse and Physical Therapy   Contact information:   [place] [address] [phone]       Follow up with [name], MD. Go on 07/14/2015.   Specialty:  Obstetrics and Gynecology   Why:  Follow up @ 1150am   Contact information:   [address] [place] [address] [phone]       Follow up with Truitt Merle, NP. Go on 07/15/2015.   Specialties:  Nurse Practitioner, Interventional Cardiology, Cardiology, Radiology   Why:  Follow up @ 11am   Contact information:   [place] [place] [address] [phone]       Follow up with Cassell Clement, MD. Schedule an appointment as soon as possible for a visit in 1 week.   Specialty:  Gastroenterology   Why:  Keep appt on March 7th @ 1045   Contact information:   [address] [place] [address] [phone]        [place] and Activity recommendation: See Discharge Instructions below  Discharge Instructions           Discharge Instructions    AMB Referral to [facility] Management    Complete by:  As directed   Reason for consult:  Referral for diagnosis of new HF  Diagnoses of:  Heart Failure  Expected date of contact:  1-3 days (reserved for hospital discharges)  Please assign patient for community nurse for transition of care and evaluate for monthly home visits. Please  assess for post hospital meals and transportation needs. For questions please contact:   Janci Minor RN, [facility] Liaison [phone])     Discharge instructions    Complete by:  As directed   Follow with Primary MD GREWAL,MICHELLE L, MD in 7 days   Get CBC, CMP, INR, 2 view Chest X ray checked  by Primary MD next visit.    Activity: As tolerated with Full fall precautions use walker/cane & assistance as needed   Disposition Home     Diet:   Heart Healthy- Check your Weight same time everyday, if you gain over 2 pounds, or you develop in leg swelling, experience more shortness of breath or chest pain, call your Primary MD immediately. Follow Cardiac Low Salt Diet and 1.5 lit/day fluid restriction.   On your next visit with your primary care physician please Get Medicines reviewed and adjusted.   Please request your Prim.MD to go over all Hospital Tests and Procedure/Radiological results at the follow up, please get all Hospital records sent to your Prim MD by signing hospital release before you go home.   If you experience worsening of your admission symptoms, develop shortness of breath, life threatening emergency, suicidal or homicidal thoughts you must seek medical attention immediately by calling 911 or calling your MD immediately  if symptoms less severe.  You Must read complete instructions/literature along with all the possible adverse reactions/side effects for all the Medicines you take and that have been prescribed to you. Take any new Medicines after you have completely understood and accpet all the possible adverse reactions/side effects.   Do not drive, operating heavy machinery, perform activities at heights, swimming or participation in water activities or provide baby sitting services if your were admitted for syncope or siezures until you have seen by Primary MD or a Neurologist and advised to do so again.  Do not drive when taking Pain medications.     Do not take more than prescribed Pain, Sleep and Anxiety Medications  Special Instructions: If you have smoked or chewed Tobacco  in the last 2 yrs please stop smoking, stop any regular Alcohol  and or any Recreational drug use.  Wear Seat belts while driving.  Please note  You were cared for by a hospitalist during your hospital stay. If you have any questions about your discharge medications or the care you received while you were in the hospital after you are discharged, you can call the unit and asked to speak with the hospitalist on call if the hospitalist that took care of you is not available. Once you are discharged, your primary care physician will handle any further medical issues. Please note that NO REFILLS for any discharge medications will be authorized once you are discharged, as it is imperative that you return to your primary care physician (or establish a relationship with a primary care physician if you do not have one) for your aftercare needs so that they can reassess your need for medications and monitor your lab values.     Discharge patient    Complete by:  As directed      Increase activity slowly    Complete by:  As directed              Discharge Medications       Medication List    STOP taking these medications        enoxaparin 100 MG/ML injection  Commonly known as:  LOVENOX     metoprolol 50 MG tablet  Commonly known as:  LOPRESSOR      TAKE these medications        acetaminophen 325 MG tablet  Commonly known as:  TYLENOL  Take 650 mg by mouth every 6 (six) hours as needed for moderate pain.     ferrous sulfate 325 (65 FE) MG tablet  Take 1 tablet (325 mg total) by mouth 2 (two) times daily with a meal.     furosemide 40 MG tablet  Commonly known as:  LASIX  Take 1 tablet (40 mg total) by mouth 2 (two) times daily.  Start taking on:  07/11/2015     lisinopril 2.5 MG tablet  Commonly known as:  PRINIVIL,ZESTRIL  Take 1 tablet (2.5  mg total) by mouth daily.     metoprolol succinate 50 MG 24 hr tablet  Commonly known as:  TOPROL-XL  Take 1 tablet (50 mg total) by mouth daily. Take with or immediately following a meal.     nitroGLYCERIN 0.4 MG SL tablet  Commonly known as:  NITROSTAT  Place 1 tablet (0.4 mg total) under the tongue every 5 (five) minutes as needed for chest pain.     nystatin 100000 UNIT/GM Powd  Apply to your groin TID     pantoprazole 40 MG tablet  Commonly known as:  PROTONIX  Take 1 tablet (40 mg total) by mouth 2 (two) times daily.     potassium chloride SA 20 MEQ tablet  Commonly known as:  K-DUR,KLOR-CON  Take 2 tablets (40 mEq total) by mouth daily.     warfarin 3 MG tablet  Commonly known as:  COUMADIN  TAKE 1 TO 1 AND 1/2 TABLETS BY MOUTH DAILY OR AS DIRECTED     zolpidem 5 MG tablet  Commonly known as:  AMBIEN  Take 1 tablet (5 mg total) by mouth at bedtime as needed for sleep.        Major procedures and Radiology Reports - PLEASE review detailed and final reports for all details, in brief -       Dg Chest 2 View  07/05/2015  CLINICAL DATA:  Newly diagnosed heart failure on chronic anticoagulation. Anemia. Exertional dyspnea 1 year. EXAM:  CHEST  2 VIEW COMPARISON:  07/03/2015 and 05/09/2015 FINDINGS: Lungs are somewhat hypoinflated without lobar consolidation or effusion. Minimal linear atelectasis/ scarring over the left mid to lower lung. Minimal prominence of the perihilar markings suggesting mild vascular congestion. Mild stable cardiomegaly. Degenerative change of the spine. IMPRESSION: Mild stable cardiomegaly with minimal vascular congestion. Electronically Signed   By: Marin Olp M.D.   On: 07/05/2015 13:51   Dg Chest Portable 1 View  07/03/2015  CLINICAL DATA:  Anemia.  Shortness of breath, fatigue. EXAM: PORTABLE CHEST 1 VIEW COMPARISON:  05/09/2015 FINDINGS: There is cardiac enlargement. Pulmonary vascular congestion is noted. No pleural effusion or edema. No  airspace consolidation identified. IMPRESSION: 1. Cardiac enlargement and pulmonary vascular congestion. Electronically Signed   By: Kerby Moors M.D.   On: 07/03/2015 17:15    Micro Results      No results found for this or any previous visit (from the past 240 hour(s)).     Today   Subjective    Nicole Gibbs today has no headache,no chest abdominal pain,no new weakness tingling or numbness, feels much better wants to go home today.    Objective   Blood pressure 125/60, pulse 88, temperature 98.6 F (37 C), temperature source Oral, resp. rate 17, height 5\' 2"  (1.575 m), weight 87.862 kg (193 lb 11.2 oz), SpO2 94 %.   Intake/Output Summary (Last 24 hours) at 07/10/15 1121 Last data filed at 07/10/15 1000  Gross per 24 hour  Intake      3 ml  Output   1400 ml  Net  -1397 ml    Exam Awake Alert, Oriented x 3, No new F.N deficits, Normal affect [place].AT,PERRAL Supple Neck,No JVD, No cervical lymphadenopathy appriciated.  Symmetrical Chest wall movement, Good air movement bilaterally, CTAB RRR,No Gallops,Rubs or new Murmurs, No Parasternal Heave +ve B.Sounds, Abd Soft, Non tender, No organomegaly appriciated, No rebound -guarding or rigidity. No Cyanosis, Clubbing or edema, No new Rash or bruise   Data Review   CBC w Diff:  Lab Results  Component Value Date   WBC 7.1 07/05/2015   WBC 7.9 05/06/2015   HGB 8.9* 07/07/2015   HGB 6.2* 05/06/2015   HCT 31.9* 07/07/2015   HCT 31.8* 07/05/2015   HCT 22.5* 05/06/2015   PLT 475* 07/05/2015   LYMPHOPCT 22 06/15/2015   MONOPCT 8 06/15/2015   EOSPCT 2 06/15/2015   BASOPCT 0 06/15/2015    CMP:  Lab Results  Component Value Date   NA 142 07/08/2015   K 3.7 07/08/2015   CL 102 07/08/2015   CO2 28 07/08/2015   BUN 15 07/08/2015   CREATININE 0.71 07/08/2015   CREATININE 0.57 06/29/2015   PROT 6.2* 07/03/2015   ALBUMIN 3.3* 07/03/2015   BILITOT 1.4* 07/03/2015   ALKPHOS 63 07/03/2015   AST 22 07/03/2015   ALT 13*  07/03/2015   Lab Results  Component Value Date   INR 2.07* 07/10/2015   INR 1.91* 07/09/2015   INR 1.82* 07/08/2015     Total Time in preparing paper work, data evaluation and todays exam - 35 minutes  Lala Lund K M.D on 07/10/2015 at 11:21 AM  [facility]   Office  [phone]

## 2015-07-08 NOTE — Progress Notes (Signed)
PATIENT DETAILS Name: Nicole Gibbs Age: 70 y.o. Sex: female Date of Birth: 1946/03/23 Admit Date: 07/03/2015 Admitting Physician Delfina Redwood, MD [id number] L, MD   Brief narrative:   70 year old female with newly diagnosed chronic systolic heart failure-history on chronic anticoagulation for large unprovoked VTE 2014-has been having ongoing microcytic anemia for the past few months. She had a negative EGD/colonoscopy in December 2016. She was admitted on 2/17 for exertional dyspnea with a hemoglobin level of 7.0.  Subjective:  Patient sitting in bed, no headache, no fever or chills, no chest or abdominal pain, no focal weakness, still has edema in her legs along with some exertional shortness of breath.  Assessment/Plan:   Symptomatic iron deficiency anemia:  Required 1 unit of packed RBC transfusion this admission with stable H&H, she had EGD and colonoscopy in December 2016, her case was discussed by the previous physician with Dr. Jessie Foot [facility] who wants to do a capsule endoscopy in the office. FOBT negative this admission, now placed on oral iron after IV on 07/04/2015, continue PPI, suspect some cardiac involvement with shortness of breath as well. Cardiology requested to evaluate and following, continue diuresis.  Mild acute on chronic Chronic systolic heart failure EF close to 35%: Newly diagnosed-has had cardiology follow-up as outpatient-per cardiology, will continue IV Lasix and added Zaroxolyn along with TED stockings for fluid mobilization, -ve 6 Lit by 07-08-15, left heart catheterization post discharged as outpatient per cards, Continue beta blocker and ACE inhibitor.  Filed Weights   07/06/15 0500 07/07/15 0513 07/08/15 0403  Weight: 93.078 kg (205 lb 3.2 oz) 91.899 kg (202 lb 9.6 oz) 89.404 kg (197 lb 1.6 oz)    Hypokalemia: Replaced, monitor.  History of DVT/PE 2014: Per patient she has been advised by prior physicians that she needs  lifelong anticoagulation. Resumed Coumadin-no evidence of active bleeding at this time. Will ask pharmacy to dose while inpatient. Patient will need follow-up with hematology as an outpatient.  Thrombocytosis: Likely secondary to iron deficiency anemia.  Runs of sinus tachycardia with MAT. Seen by cardiology, beta blocker dose adjusted, monitor.    Disposition: Remain inpatient-home once cleared by Cards.  Antimicrobial agents  See below  Anti-infectives    None      DVT Prophylaxis: Coumadin  Lab Results  Component Value Date   INR 1.82* 07/08/2015   INR 1.74* 07/07/2015   INR 1.79* 07/06/2015    Code Status: Full code   Family Communication None at bedside  Procedures: None  CONSULTS:   Cards  Time spent 25 minutes-Greater than 50% of this time was spent in counseling, explanation of diagnosis, planning of further management, and coordination of care.  MEDICATIONS: Scheduled Meds: . sodium chloride  10 mL/hr Intravenous Once  . ferrous sulfate  325 mg Oral BID WC  . furosemide  80 mg Intravenous BID  . lisinopril  2.5 mg Oral Daily  . metolazone  2.5 mg Oral Daily  . metoprolol succinate  100 mg Oral Daily  . pantoprazole  40 mg Oral BID  . potassium chloride  40 mEq Oral BID  . sodium chloride flush  3 mL Intravenous Q12H  . warfarin  6 mg Oral ONCE-1800  . Warfarin - Pharmacist Dosing Inpatient   Does not apply q1800   Continuous Infusions:  PRN Meds:.acetaminophen **OR** acetaminophen, diphenhydrAMINE, morphine injection, [DISCONTINUED] ondansetron **OR** ondansetron (ZOFRAN) IV, zolpidem    PHYSICAL EXAM:  Vital signs in last 24 hours: Filed Vitals:   07/07/15 0513 07/07/15 1342 07/07/15 2002 07/08/15 0403  BP: 115/68 115/71 116/96 102/53  Pulse: 75 85 91 82  Temp: 97.7 F (36.5 C) 98 F (36.7 C) 97.7 F (36.5 C) 99.1 F (37.3 C)  TempSrc: Oral Oral Oral Oral  Resp: 18 15 18 16   Height: 5\' 2"  (1.575 m)     Weight: 91.899 kg (202 lb  9.6 oz)   89.404 kg (197 lb 1.6 oz)  SpO2: 94% 98% 96% 87%    Weight change: -2.495 kg (-5 lb 8 oz) Filed Weights   07/06/15 0500 07/07/15 0513 07/08/15 0403  Weight: 93.078 kg (205 lb 3.2 oz) 91.899 kg (202 lb 9.6 oz) 89.404 kg (197 lb 1.6 oz)   Body mass index is 36.04 kg/(m^2).   Gen Exam: Awake and alert with clear speech.   Neck: Supple, No JVD.   Chest: few rales.   CVS: S1 S2 Regular, no murmurs.  Abdomen: soft, BS +, non tender, non distended.  Extremities: 2+ edema, lower extremities warm to touch. Neurologic: Non Focal.   Skin: No Rash.   Wounds: N/A.   Intake/Output from previous day:  Intake/Output Summary (Last 24 hours) at 07/08/15 1011 Last data filed at 07/08/15 0400  Gross per 24 hour  Intake    480 ml  Output   2700 ml  Net  -2220 ml     LAB RESULTS: CBC  Recent Labs Lab 07/01/15 1213 07/03/15 1313 07/04/15 0445 07/05/15 0355 07/07/15 0504  WBC 8.0 6.1 6.7 7.1  --   HGB 7.1* 7.0* 7.5* 9.0* 8.9*  HCT 26.8* 27.0* 27.6* 32.7*  31.8* 31.9*  PLT 612* 537* 466* 475*  --   MCV 67.7* 69.2* 70.4* 70.9*  --   MCH 17.9* 17.9* 19.1* 19.5*  --   MCHC 26.5* 25.9* 27.2* 27.5*  --   RDW 22.4* 22.1* 22.8* 22.4*  --     Chemistries   Recent Labs Lab 07/04/15 0445 07/05/15 0355 07/06/15 0623 07/07/15 0504 07/08/15 0601  NA 143 145 142 143 142  K 3.3* 3.3* 4.1 3.3* 3.7  CL 105 108 108 104 102  CO2 26 27 23 28 28   GLUCOSE 88 93 106* 95 93  BUN 13 14 16 15 15   CREATININE 0.76 0.91 0.80 0.84 0.71  CALCIUM 8.6* 8.7* 8.9 8.3* 8.8*  MG  --   --  1.9  --  1.8    CBG: No results for input(s): GLUCAP in the last 168 hours.  GFR Estimated Creatinine Clearance: 70.9 mL/min (by C-G formula based on Cr of 0.71).  Coagulation profile  Recent Labs Lab 07/04/15 1032 07/05/15 0355 07/06/15 0623 07/07/15 0504 07/08/15 0601  INR 2.22* 2.05* 1.79* 1.74* 1.82*    Cardiac Enzymes No results for input(s): CKMB, TROPONINI, MYOGLOBIN in the last 168  hours.  Invalid input(s): CK  Invalid input(s): POCBNP No results for input(s): DDIMER in the last 72 hours. No results for input(s): HGBA1C in the last 72 hours. No results for input(s): CHOL, HDL, LDLCALC, TRIG, CHOLHDL, LDLDIRECT in the last 72 hours. No results for input(s): TSH, T4TOTAL, T3FREE, THYROIDAB in the last 72 hours.  Invalid input(s): FREET3 No results for input(s): VITAMINB12, FOLATE, FERRITIN, TIBC, IRON, RETICCTPCT in the last 72 hours. No results for input(s): LIPASE, AMYLASE in the last 72 hours.  Urine Studies No results for input(s): UHGB, CRYS in the last 72 hours.  Invalid input(s): UACOL, UAPR, USPG,  UPH, UTP, UGL, UKET, UBIL, UNIT, UROB, ULEU, UEPI, UWBC, URBC, UBAC, CAST, UCOM, BILUA  MICROBIOLOGY: No results found for this or any previous visit (from the past 240 hour(s)).  RADIOLOGY STUDIES/RESULTS: Dg Chest 2 View  07/05/2015  CLINICAL DATA:  Newly diagnosed heart failure on chronic anticoagulation. Anemia. Exertional dyspnea 1 year. EXAM: CHEST  2 VIEW COMPARISON:  07/03/2015 and 05/09/2015 FINDINGS: Lungs are somewhat hypoinflated without lobar consolidation or effusion. Minimal linear atelectasis/ scarring over the left mid to lower lung. Minimal prominence of the perihilar markings suggesting mild vascular congestion. Mild stable cardiomegaly. Degenerative change of the spine. IMPRESSION: Mild stable cardiomegaly with minimal vascular congestion. Electronically Signed   By: Marin Olp M.D.   On: 07/05/2015 13:51   Dg Chest Portable 1 View  07/03/2015  CLINICAL DATA:  Anemia.  Shortness of breath, fatigue. EXAM: PORTABLE CHEST 1 VIEW COMPARISON:  05/09/2015 FINDINGS: There is cardiac enlargement. Pulmonary vascular congestion is noted. No pleural effusion or edema. No airspace consolidation identified. IMPRESSION: 1. Cardiac enlargement and pulmonary vascular congestion. Electronically Signed   By: Kerby Moors M.D.   On: 07/03/2015 17:15    Signature  Thurnell Lose M.D on 07/08/2015 at 10:11 AM  Between 7am to 7pm - Pager - [phone], After 7pm go to [email or web address] - password [email or web address]  [phone]   LOS: 5 days

## 2015-07-08 NOTE — Evaluation (Signed)
Occupational Therapy Evaluation and Discharge Patient Details Name: Nicole Gibbs MRN: [id number] DOB: Oct 27, 1945 Today's Date: 07/08/2015    History of Present Illness Pt is a 70 y/o F admitted after routine labs (for prior cardiac cath) revealed a Hgb of 7.1 but no obvious source of bleeding found.  Her diagnosis is chronic systolic HF in setting of worsening anemia along w/ tachycardia.  Her PMH includes PE, DVT Bil LEs, obesity, and w/ recent Lt wrist fx, currently in splint.   Clinical Impression   Pt was able to complete ADL and IADL at a modified independent level prior to admission. She reports having had low endurance and increasing generalized weakness. Pt demonstrated ability to perform UB bathing, dressing, grooming and toileting activities at a supervision level.  Educated pt in use of sock aide, reacher, long shoe horn and long handled bath sponge and in energy conservation strategies.  Agree with resumption of HHPT upon discharge for strengthening. No further OT needs.    Follow Up Recommendations  No OT follow up    Equipment Recommendations  None recommended by OT    Recommendations for Other Services       Precautions / Restrictions Precautions Precautions: Fall Precaution Comments: Lt wrist splint Restrictions Weight Bearing Restrictions: No Other Position/Activity Restrictions: unclear, pt unable to confirm; encouraged avoiding WB until confirmed w/ MD      Mobility Bed Mobility               General bed mobility comments: pt in chair  Transfers Overall transfer level: Needs assistance Equipment used: None Transfers: Sit to/from Stand Sit to Stand: Supervision         General transfer comment: from chair and toilet    Balance Overall balance assessment: Needs assistance Sitting-balance support: No upper extremity supported;Feet supported Sitting balance-Leahy Scale: Fair     Standing balance support: Single extremity supported;During  functional activity Standing balance-Leahy Scale: Fair                              ADL Overall ADL's : Needs assistance/impaired Eating/Feeding: Independent;Sitting   Grooming: Wash/dry hands;Supervision/safety;Standing   Upper Body Bathing: Set up;Sitting   Lower Body Bathing: Supervison/ safety;Sit to/from stand Lower Body Bathing Details (indicate cue type and reason): recommended long handled sponge Upper Body Dressing : Set up;Sitting Upper Body Dressing Details (indicate cue type and reason): changed soiled gown Lower Body Dressing: Sit to/from stand;Supervision/safety Lower Body Dressing Details (indicate cue type and reason): issued and instructed in use of sock aid, reacher and long shoe horn Toilet Transfer: Supervision/safety;Ambulation;Comfort height toilet   Toileting- Clothing Manipulation and Hygiene: Supervision/safety;Sit to/from stand       Functional mobility during ADLs: Supervision/safety (no device within room) General ADL Comments: Instructed in energy conservation.     Vision     Perception     Praxis      Pertinent Vitals/Pain Pain Assessment: No/denies pain     Hand Dominance Right   Extremity/Trunk Assessment Upper Extremity Assessment Upper Extremity Assessment: LUE deficits/detail;Generalized weakness LUE Deficits / Details: Lt wrist splint due to recent fall when stepping up onto curb w/ resultant fx   Lower Extremity Assessment Lower Extremity Assessment: Defer to PT evaluation       Communication Communication Communication: No difficulties   Cognition Arousal/Alertness: Awake/alert Behavior During Therapy: WFL for tasks assessed/performed Overall Cognitive Status: Within Functional Limits for tasks assessed  General Comments       Exercises   Other Exercises Other Exercises: manually resisted Bil knee flexion/extension x10   Shoulder Instructions      Home Living  Family/patient expects to be discharged to:: Private residence Living Arrangements: Spouse/significant other;Children Available Help at Discharge: Family;Available 24 hours/day Type of Home: House Home Access: Ramped entrance     Home Layout: Two level;Bed/bath upstairs Alternate Level Stairs-Number of Steps: flight Alternate Level Stairs-Rails: Left Bathroom Shower/Tub: Walk-in shower   Bathroom Toilet: Handicapped height     Home Equipment: Environmental consultant - 2 wheels;Walker - standard;Other (comment);Wheelchair - Education officer, community - power;Electric scooter;Cane - single point;Bedside commode;Shower seat;Hand held shower head;Adaptive equipment (stair lift) Adaptive Equipment: Reacher Additional Comments: Most equipment is her husband's equipment. He has MS and is "bedridden" per pt.  He has an aide that assists 4 hrs/day 7days/wk, otherwise pt and her daughter are caring for him.      Prior Functioning/Environment Level of Independence: Independent        Comments: Pt reports she normally doesn't walk farther than 10 ft at a time due to SOB.  She is currently receiving HHPT for her Lt wrist 2x/wk.  Her PT encouraged her to use a cane but she has refused thus far.     OT Diagnosis: Generalized weakness   OT Problem List:     OT Treatment/Interventions:      OT Goals(Current goals can be found in the care plan section) Acute Rehab OT Goals Patient Stated Goal: to get stronger  OT Frequency:     Barriers to D/C:            Co-evaluation              End of Session    Activity Tolerance: Patient tolerated treatment well Patient left: in chair;with call bell/phone within reach   Time: [id number] OT Time Calculation (min): 27 min Charges:  OT General Charges $OT Visit: 1 Procedure OT Evaluation $OT Eval Low Complexity: 1 Procedure OT Treatments $Self Care/Home Management : 8-22 mins G-Codes:    Malka So 07/08/2015, 12:05 PM  [phone]

## 2015-07-08 NOTE — Plan of Care (Signed)
Problem: Education: Goal: Ability to demonstrate managment of disease process will improve Outcome: Not Progressing Pt is still having a hard time understanding the importance of a low sodium diet and fluid restriction. Discussed at length options for a low sodium diet. Pt states that most of her meals are fast food. Pt needs continuing reinforcement on these topics.

## 2015-07-08 NOTE — Discharge Instructions (Signed)
Follow with Primary MD GREWAL,MICHELLE L, MD in 7 days   Get CBC, CMP, INR, 2 view Chest X ray checked  by Primary MD next visit.    Activity: As tolerated with Full fall precautions use walker/cane & assistance as needed   Disposition Home     Diet:   Heart Healthy- Check your Weight same time everyday, if you gain over 2 pounds, or you develop in leg swelling, experience more shortness of breath or chest pain, call your Primary MD immediately. Follow Cardiac Low Salt Diet and 1.5 lit/day fluid restriction.   On your next visit with your primary care physician please Get Medicines reviewed and adjusted.   Please request your Prim.MD to go over all Hospital Tests and Procedure/Radiological results at the follow up, please get all Hospital records sent to your Prim MD by signing hospital release before you go home.   If you experience worsening of your admission symptoms, develop shortness of breath, life threatening emergency, suicidal or homicidal thoughts you must seek medical attention immediately by calling 911 or calling your MD immediately  if symptoms less severe.  You Must read complete instructions/literature along with all the possible adverse reactions/side effects for all the Medicines you take and that have been prescribed to you. Take any new Medicines after you have completely understood and accpet all the possible adverse reactions/side effects.   Do not drive, operating heavy machinery, perform activities at heights, swimming or participation in water activities or provide baby sitting services if your were admitted for syncope or siezures until you have seen by Primary MD or a Neurologist and advised to do so again.  Do not drive when taking Pain medications.    Do not take more than prescribed Pain, Sleep and Anxiety Medications  Special Instructions: If you have smoked or chewed Tobacco  in the last 2 yrs please stop smoking, stop any regular Alcohol  and or any  Recreational drug use.  Wear Seat belts while driving.   Please note  You were cared for by a hospitalist during your hospital stay. If you have any questions about your discharge medications or the care you received while you were in the hospital after you are discharged, you can call the unit and asked to speak with the hospitalist on call if the hospitalist that took care of you is not available. Once you are discharged, your primary care physician will handle any further medical issues. Please note that NO REFILLS for any discharge medications will be authorized once you are discharged, as it is imperative that you return to your primary care physician (or establish a relationship with a primary care physician if you do not have one) for your aftercare needs so that they can reassess your need for medications and monitor your lab values.    Anemia, Nonspecific Anemia is a condition in which the concentration of red blood cells or hemoglobin in the blood is below normal. Hemoglobin is a substance in red blood cells that carries oxygen to the tissues of the body. Anemia results in not enough oxygen reaching these tissues.  CAUSES  Common causes of anemia include:   Excessive bleeding. Bleeding may be internal or external. This includes excessive bleeding from periods (in women) or from the intestine.   Poor nutrition.   Chronic kidney, thyroid, and liver disease.  Bone marrow disorders that decrease red blood cell production.  Cancer and treatments for cancer.  HIV, AIDS, and their treatments.  Spleen problems  that increase red blood cell destruction.  Blood disorders.  Excess destruction of red blood cells due to infection, medicines, and autoimmune disorders. SIGNS AND SYMPTOMS   Minor weakness.   Dizziness.   Headache.  Palpitations.   Shortness of breath, especially with exercise.   Paleness.  Cold sensitivity.  Indigestion.  Nausea.  Difficulty  sleeping.  Difficulty concentrating. Symptoms may occur suddenly or they may develop slowly.  DIAGNOSIS  Additional blood tests are often needed. These help your health care provider determine the best treatment. Your health care provider will check your stool for blood and look for other causes of blood loss.  TREATMENT  Treatment varies depending on the cause of the anemia. Treatment can include:   Supplements of iron, vitamin 123456, or folic acid.   Hormone medicines.   A blood transfusion. This may be needed if blood loss is severe.   Hospitalization. This may be needed if there is significant continual blood loss.   Dietary changes.  Spleen removal. HOME CARE INSTRUCTIONS Keep all follow-up appointments. It often takes many weeks to correct anemia, and having your health care provider check on your condition and your response to treatment is very important. SEEK IMMEDIATE MEDICAL CARE IF:   You develop extreme weakness, shortness of breath, or chest pain.   You become dizzy or have trouble concentrating.  You develop heavy vaginal bleeding.   You develop a rash.   You have bloody or black, tarry stools.   You faint.   You vomit up blood.   You vomit repeatedly.   You have abdominal pain.  You have a fever or persistent symptoms for more than 2-3 days.   You have a fever and your symptoms suddenly get worse.   You are dehydrated.  MAKE SURE YOU:  Understand these instructions.  Will watch your condition.  Will get help right away if you are not doing well or get worse.   This information is not intended to replace advice given to you by your health care provider. Make sure you discuss any questions you have with your health care provider.   Document Released: 06/09/2004 Document Revised: 01/02/2013 Document Reviewed: 10/26/2012 Elsevier Interactive Patient Education 2016 [employer]    Heart Failure Heart failure is a condition in  which the heart has trouble pumping blood. This means your heart does not pump blood efficiently for your body to work well. In some cases of heart failure, fluid may back up into your lungs or you may have swelling (edema) in your lower legs. Heart failure is usually a long-term (chronic) condition. It is important for you to take good care of yourself and follow your health care provider's treatment plan. CAUSES  Some health conditions can cause heart failure. Those health conditions include:  High blood pressure (hypertension). Hypertension causes the heart muscle to work harder than normal. When pressure in the blood vessels is high, the heart needs to pump (contract) with more force in order to circulate blood throughout the body. High blood pressure eventually causes the heart to become stiff and weak.  Coronary artery disease (CAD). CAD is the buildup of cholesterol and fat (plaque) in the arteries of the heart. The blockage in the arteries deprives the heart muscle of oxygen and blood. This can cause chest pain and may lead to a heart attack. High blood pressure can also contribute to CAD.  Heart attack (myocardial infarction). A heart attack occurs when one or more arteries in the heart  become blocked. The loss of oxygen damages the muscle tissue of the heart. When this happens, part of the heart muscle dies. The injured tissue does not contract as well and weakens the heart's ability to pump blood.  Abnormal heart valves. When the heart valves do not open and close properly, it can cause heart failure. This makes the heart muscle pump harder to keep the blood flowing.  Heart muscle disease (cardiomyopathy or myocarditis). Heart muscle disease is damage to the heart muscle from a variety of causes. These can include drug or alcohol abuse, infections, or unknown reasons. These can increase the risk of heart failure.  Lung disease. Lung disease makes the heart work harder because the lungs do  not work properly. This can cause a strain on the heart, leading it to fail.  Diabetes. Diabetes increases the risk of heart failure. High blood sugar contributes to high fat (lipid) levels in the blood. Diabetes can also cause slow damage to tiny blood vessels that carry important nutrients to the heart muscle. When the heart does not get enough oxygen and food, it can cause the heart to become weak and stiff. This leads to a heart that does not contract efficiently.  Other conditions can contribute to heart failure. These include abnormal heart rhythms, thyroid problems, and low blood counts (anemia). Certain unhealthy behaviors can increase the risk of heart failure, including:  Being overweight.  Smoking or chewing tobacco.  Eating foods high in fat and cholesterol.  Abusing illicit drugs or alcohol.  Lacking physical activity. SYMPTOMS  Heart failure symptoms may vary and can be hard to detect. Symptoms may include:  Shortness of breath with activity, such as climbing stairs.  Persistent cough.  Swelling of the feet, ankles, legs, or abdomen.  Unexplained weight gain.  Difficulty breathing when lying flat (orthopnea).  Waking from sleep because of the need to sit up and get more air.  Rapid heartbeat.  Fatigue and loss of energy.  Feeling light-headed, dizzy, or close to fainting.  Loss of appetite.  Nausea.  Increased urination during the night (nocturia). DIAGNOSIS  A diagnosis of heart failure is based on your history, symptoms, physical examination, and diagnostic tests. Diagnostic tests for heart failure may include:  Echocardiography.  Electrocardiography.  Chest X-ray.  Blood tests.  Exercise stress test.  Cardiac angiography.  Radionuclide scans. TREATMENT  Treatment is aimed at managing the symptoms of heart failure. Medicines, behavioral changes, or surgical intervention may be necessary to treat heart failure.  Medicines to help treat heart  failure may include:  Angiotensin-converting enzyme (ACE) inhibitors. This type of medicine blocks the effects of a blood protein called angiotensin-converting enzyme. ACE inhibitors relax (dilate) the blood vessels and help lower blood pressure.  Angiotensin receptor blockers (ARBs). This type of medicine blocks the actions of a blood protein called angiotensin. Angiotensin receptor blockers dilate the blood vessels and help lower blood pressure.  Water pills (diuretics). Diuretics cause the kidneys to remove salt and water from the blood. The extra fluid is removed through urination. This loss of extra fluid lowers the volume of blood the heart pumps.  Beta blockers. These prevent the heart from beating too fast and improve heart muscle strength.  Digitalis. This increases the force of the heartbeat.  Healthy behavior changes include:  Obtaining and maintaining a healthy weight.  Stopping smoking or chewing tobacco.  Eating heart-healthy foods.  Limiting or avoiding alcohol.  Stopping illicit drug use.  Physical activity as directed by your  health care provider.  Surgical treatment for heart failure may include:  A procedure to open blocked arteries, repair damaged heart valves, or remove damaged heart muscle tissue.  A pacemaker to improve heart muscle function and control certain abnormal heart rhythms.  An internal cardioverter defibrillator to treat certain serious abnormal heart rhythms.  A left ventricular assist device (LVAD) to assist the pumping ability of the heart. HOME CARE INSTRUCTIONS   Take medicines only as directed by your health care provider. Medicines are important in reducing the workload of your heart, slowing the progression of heart failure, and improving your symptoms.  Do not stop taking your medicine unless directed by your health care provider.  Do not skip any dose of medicine.  Refill your prescriptions before you run out of medicine. Your  medicines are needed every day.  Engage in moderate physical activity if directed by your health care provider. Moderate physical activity can benefit some people. The elderly and people with severe heart failure should consult with a health care provider for physical activity recommendations.  Eat heart-healthy foods. Food choices should be free of trans fat and low in saturated fat, cholesterol, and salt (sodium). Healthy choices include fresh or frozen fruits and vegetables, fish, lean meats, legumes, fat-free or low-fat dairy products, and whole grain or high fiber foods. Talk to a dietitian to learn more about heart-healthy foods.  Limit sodium if directed by your health care provider. Sodium restriction may reduce symptoms of heart failure in some people. Talk to a dietitian to learn more about heart-healthy seasonings.  Use healthy cooking methods. Healthy cooking methods include roasting, grilling, broiling, baking, poaching, steaming, or stir-frying. Talk to a dietitian to learn more about healthy cooking methods.  Limit fluids if directed by your health care provider. Fluid restriction may reduce symptoms of heart failure in some people.  Weigh yourself every day. Daily weights are important in the early recognition of excess fluid. You should weigh yourself every morning after you urinate and before you eat breakfast. Wear the same amount of clothing each time you weigh yourself. Record your daily weight. Provide your health care provider with your weight record.  Monitor and record your blood pressure if directed by your health care provider.  Check your pulse if directed by your health care provider.  Lose weight if directed by your health care provider. Weight loss may reduce symptoms of heart failure in some people.  Stop smoking or chewing tobacco. Nicotine makes your heart work harder by causing your blood vessels to constrict. Do not use nicotine gum or patches before talking to  your health care provider.  Keep all follow-up visits as directed by your health care provider. This is important.  Limit alcohol intake to no more than 1 drink per day for nonpregnant women and 2 drinks per day for men. One drink equals 12 ounces of beer, 5 ounces of wine, or 1 ounces of hard liquor. Drinking more than that is harmful to your heart. Tell your health care provider if you drink alcohol several times a week. Talk with your health care provider about whether alcohol is safe for you. If your heart has already been damaged by alcohol or you have severe heart failure, drinking alcohol should be stopped completely.  Stop illicit drug use.  Stay up-to-date with immunizations. It is especially important to prevent respiratory infections through current pneumococcal and influenza immunizations.  Manage other health conditions such as hypertension, diabetes, thyroid disease, or abnormal heart  rhythms as directed by your health care provider.  Learn to manage stress.  Plan rest periods when fatigued.  Learn strategies to manage high temperatures. If the weather is extremely hot:  Avoid vigorous physical activity.  Use air conditioning or fans or seek a cooler location.  Avoid caffeine and alcohol.  Wear loose-fitting, lightweight, and light-colored clothing.  Learn strategies to manage cold temperatures. If the weather is extremely cold:  Avoid vigorous physical activity.  Layer clothes.  Wear mittens or gloves, a hat, and a scarf when going outside.  Avoid alcohol.  Obtain ongoing education and support as needed.  Participate in or seek rehabilitation as needed to maintain or improve independence and quality of life. SEEK MEDICAL CARE IF:   You have a rapid weight gain.  You have increasing shortness of breath that is unusual for you.  You are unable to participate in your usual physical activities.  You tire easily.  You cough more than normal, especially with  physical activity.  You have any or more swelling in areas such as your hands, feet, ankles, or abdomen.  You are unable to sleep because it is hard to breathe.  You feel like your heart is beating fast (palpitations).  You become dizzy or light-headed upon standing up. SEEK IMMEDIATE MEDICAL CARE IF:   You have difficulty breathing.  There is a change in mental status such as decreased alertness or difficulty with concentration.  You have a pain or discomfort in your chest.  You have an episode of fainting (syncope). MAKE SURE YOU:   Understand these instructions.  Will watch your condition.  Will get help right away if you are not doing well or get worse.   This information is not intended to replace advice given to you by your health care provider. Make sure you discuss any questions you have with your health care provider.   Document Released: 05/02/2005 Document Revised: 09/16/2014 Document Reviewed: 06/01/2012 Elsevier Interactive Patient Education [employer].

## 2015-07-08 NOTE — Progress Notes (Signed)
ANTICOAGULATION CONSULT NOTE - [facility] for coumadin  Indication: hx PE and DVT  Allergies  Allergen Reactions  . Keflex [Cephalexin] Diarrhea  . Sulfa Antibiotics Hives    Patient Measurements: Height: 5\' 2"  (157.5 cm) Weight: 197 lb 1.6 oz (89.404 kg) IBW/kg (Calculated) : 50.1  Vital Signs: Temp: 99.1 F (37.3 C) (02/22 0403) Temp Source: Oral (02/22 0403) BP: 102/53 mmHg (02/22 0403) Pulse Rate: 82 (02/22 0403)  Labs:  Recent Labs  07/06/15 0623 07/07/15 0504 07/08/15 0601  HGB  --  8.9*  --   HCT  --  31.9*  --   LABPROT 20.8* 20.3* 21.1*  INR 1.79* 1.74* 1.82*  CREATININE 0.80 0.84 0.71    Estimated Creatinine Clearance: 70.9 mL/min (by C-G formula based on Cr of 0.71).   Medical History: Past Medical History  Diagnosis Date  . Thyroid nodule     no meds  . Hypertensive heart disease   . Pulmonary embolism, bilateral (H[facility])     a. chronic coumadin.  . DVT, bilateral lower limbs ([place])   . Arthritis     knees  . Postmenopausal vaginal bleeding   . Rash     under breasts  . Nocturia   . Obese   . Chronic combined systolic and diastolic CHF (congestive heart failure) ([place])     a. 07/2012 Echo: EF 55-60%, Gr1 DD;  b. 06/2015 Echo: EF 35-40%, triv AI, Mod MR, mod dil LA, mildly reduced RV.  . H/O cardiovascular stress test     a. 07/2012 MV: fixed mid-dist anterior and apical defect - scar vs attenuation, distal ant HK, no reversibility, EF 51%-->low risk.    Medications:  Prescriptions prior to admission  Medication Sig Dispense Refill Last Dose  . acetaminophen (TYLENOL) 325 MG tablet Take 650 mg by mouth every 6 (six) hours as needed for moderate pain.   Past Month at Unknown time  . enoxaparin (LOVENOX) 100 MG/ML injection Give one injection on Saturday night. Then 1 injection Sunday AM and PM. 3 Syringe 0 not started  . furosemide (LASIX) 40 MG tablet Take 1 tablet (40 mg total) by mouth 2 (two) times daily. 60 tablet 6  07/02/2015 at Unknown time  . lisinopril (PRINIVIL,ZESTRIL) 2.5 MG tablet Take 1 tablet (2.5 mg total) by mouth daily. 30 tablet 6 07/02/2015 at Unknown time  . metoprolol (LOPRESSOR) 50 MG tablet Take 2 tablets in the morning and 1 tablet in the evening. (Patient taking differently: Take 50 mg by mouth daily. ) 270 tablet 1 07/03/2015 at 1000  . nitroGLYCERIN (NITROSTAT) 0.4 MG SL tablet Place 1 tablet (0.4 mg total) under the tongue every 5 (five) minutes as needed for chest pain. 25 tablet 3 not used  . zolpidem (AMBIEN) 5 MG tablet Take 1 tablet (5 mg total) by mouth at bedtime as needed for sleep. 30 tablet 0 07/02/2015 at Unknown time  . potassium chloride SA (K-DUR,KLOR-CON) 20 MEQ tablet Take 1 tablet (20 mEq total) by mouth 2 (two) times daily. (Patient not taking: Reported on 07/03/2015) 60 tablet 6 Not Taking at Unknown time  . warfarin (COUMADIN) 3 MG tablet TAKE 1 TO 1 AND 1/2 TABLETS BY MOUTH DAILY OR AS DIRECTED (Patient taking differently: takes 4.5mg  only on sun, takes 3mg  all other days) 120 tablet 1 on hold   Scheduled:  . sodium chloride  10 mL/hr Intravenous Once  . ferrous sulfate  325 mg Oral BID WC  . furosemide  80  mg Intravenous BID  . lisinopril  2.5 mg Oral Daily  . metolazone  2.5 mg Oral Daily  . metoprolol succinate  100 mg Oral Daily  . pantoprazole (PROTONIX) IV  40 mg Intravenous Q12H  . potassium chloride  40 mEq Oral BID  . sodium chloride flush  3 mL Intravenous Q12H  . warfarin  4.5 mg Oral ONCE-1800  . Warfarin - Pharmacist Dosing Inpatient   Does not apply q1800    Assessment: 70 yo female here with anemia on coumadin PTA for hx PE and DVT. Coumadin was on hold as outpatient with plans for cath on Monday but cath plans have been cancelled due to anemia.   INR= 1.82   Home coumadin dose: 4.5 mg on Sunday, 3 mg all other days (last clinic visit 2/15)  Goal of Therapy:  INR 2-3 Monitor platelets by anticoagulation protocol: Yes   Plan:  -Coumadin 6mg   po today -Daily PT/INR  Hildred Laser, Pharm D 07/08/2015 7:55 AM

## 2015-07-09 LAB — PROTIME-INR
INR: 1.91 — ABNORMAL HIGH (ref 0.00–1.49)
PROTHROMBIN TIME: 21.8 s — AB (ref 11.6–15.2)

## 2015-07-09 MED ORDER — SODIUM CHLORIDE 0.9 % IV BOLUS (SEPSIS)
250.0000 mL | Freq: Once | INTRAVENOUS | Status: AC
  Administered 2015-07-09: 250 mL via INTRAVENOUS

## 2015-07-09 MED ORDER — NYSTATIN 100000 UNIT/GM EX POWD
Freq: Three times a day (TID) | CUTANEOUS | Status: DC
  Administered 2015-07-09: 1 via TOPICAL
  Administered 2015-07-09 (×2): via TOPICAL
  Administered 2015-07-10: 1 via TOPICAL
  Filled 2015-07-09: qty 15

## 2015-07-09 MED ORDER — NYSTATIN 100000 UNIT/GM EX POWD: CUTANEOUS | Status: DC

## 2015-07-09 MED ORDER — FUROSEMIDE 80 MG PO TABS: 80.0000 mg | ORAL_TABLET | Freq: Two times a day (BID) | ORAL | Status: DC

## 2015-07-09 MED ORDER — METOPROLOL SUCCINATE ER 50 MG PO TB24: 50.0000 mg | ORAL_TABLET | Freq: Every day | ORAL | Status: DC

## 2015-07-09 MED ORDER — WARFARIN SODIUM 2.5 MG PO TABS
4.5000 mg | ORAL_TABLET | Freq: Once | ORAL | Status: AC
  Administered 2015-07-09: 4.5 mg via ORAL
  Filled 2015-07-09: qty 1

## 2015-07-09 NOTE — Progress Notes (Signed)
PATIENT DETAILS Name: Nicole Gibbs Age: 70 y.o. Sex: female Date of Birth: 1946/04/28 Admit Date: 07/03/2015 Admitting Physician Delfina Redwood, MD YH:[id number] L, MD   Brief narrative:   70 year old female with newly diagnosed chronic systolic heart failure-history on chronic anticoagulation for large unprovoked VTE 2014-has been having ongoing microcytic anemia for the past few months. She had a negative EGD/colonoscopy in December 2016. She was admitted on 2/17 for exertional dyspnea with a hemoglobin level of 7.0.  Subjective:  Patient sitting in bed, no headache, no fever or chills, no chest or abdominal pain, no focal weakness, improved edema, shortness of breath much improved.  Assessment/Plan:   Symptomatic iron deficiency anemia:  Required 1 unit of packed RBC transfusion this admission with stable H&H, she had EGD and colonoscopy in December 2016, her case was discussed by the previous physician with Dr. Jessie Foot [facility] who wants to do a capsule endoscopy in the office. FOBT negative this admission, now placed on oral iron after IV on 07/04/2015, continue PPI, suspect some cardiac involvement with shortness of breath as well. Cardiology requested to evaluate and following, continue diuresis.  Mild acute on chronic Chronic systolic heart failure EF close to 35%: Newly diagnosed-has had cardiology follow-up as outpatient-per cardiology, been diuresed with Lasix and Zaroxolyn along with TED stockings for fluid mobilization, -ve 7.5 Lit by 07-09-15, left heart catheterization post discharged as outpatient per cards, continue ACE inhibitor, blood pressure now soft we'll cut back on diuretics and beta blocker dose, if stable discharge in the morning.  Filed Weights   07/07/15 0513 07/08/15 0403 07/09/15 0559  Weight: 91.899 kg (202 lb 9.6 oz) 89.404 kg (197 lb 1.6 oz) 87.862 kg (193 lb 11.2 oz)    Hypokalemia: Replaced, monitor.  History of DVT/PE 2014: Per  patient she has been advised by prior physicians that she needs lifelong anticoagulation. Resumed Coumadin-no evidence of active bleeding at this time. Will ask pharmacy to dose while inpatient. Patient will need follow-up with hematology as an outpatient.  Thrombocytosis: Likely secondary to iron deficiency anemia.  Runs of sinus tachycardia with MAT. Seen by cardiology, beta blocker dose adjusted, monitor.    Disposition:  DC in the morning if blood pressure stable.  Antimicrobial agents  See below  Anti-infectives    None      DVT Prophylaxis: Coumadin  Lab Results  Component Value Date   INR 1.91* 07/09/2015   INR 1.82* 07/08/2015   INR 1.74* 07/07/2015    Code Status: Full code   Family Communication None at bedside  Procedures: None  CONSULTS:   Cards  Time spent 25 minutes-Greater than 50% of this time was spent in counseling, explanation of diagnosis, planning of further management, and coordination of care.  MEDICATIONS: Scheduled Meds: . sodium chloride  10 mL/hr Intravenous Once  . ferrous sulfate  325 mg Oral BID WC  . [START ON 07/10/2015] furosemide  80 mg Oral BID  . lisinopril  2.5 mg Oral Daily  . metolazone  2.5 mg Oral Daily  . [START ON 07/10/2015] metoprolol succinate  50 mg Oral Daily  . nystatin   Topical TID  . pantoprazole  40 mg Oral BID  . sodium chloride flush  3 mL Intravenous Q12H  . warfarin  4.5 mg Oral ONCE-1800  . Warfarin - Pharmacist Dosing Inpatient   Does not apply q1800   Continuous Infusions:  PRN Meds:.acetaminophen **OR**  acetaminophen, diphenhydrAMINE, morphine injection, [DISCONTINUED] ondansetron **OR** ondansetron (ZOFRAN) IV, zolpidem    PHYSICAL EXAM: Vital signs in last 24 hours: Filed Vitals:   07/08/15 2135 07/09/15 0559 07/09/15 0940 07/09/15 0946  BP: 106/63 105/54 88/45 86/50   Pulse: 70 82 72   Temp: 97.9 F (36.6 C) 98.4 F (36.9 C) 98.8 F (37.1 C)   TempSrc: Oral Oral Oral   Resp: 20 18  18    Height:      Weight:  87.862 kg (193 lb 11.2 oz)    SpO2: 96% 93% 97%     Weight change: -1.542 kg (-3 lb 6.4 oz) Filed Weights   07/07/15 0513 07/08/15 0403 07/09/15 0559  Weight: 91.899 kg (202 lb 9.6 oz) 89.404 kg (197 lb 1.6 oz) 87.862 kg (193 lb 11.2 oz)   Body mass index is 35.42 kg/(m^2).   Gen Exam: Awake and alert with clear speech.   Neck: Supple, No JVD.   Chest: few rales.   CVS: S1 S2 Regular, no murmurs.  Abdomen: soft, BS +, non tender, non distended.  Extremities: 2+ edema, lower extremities warm to touch. Neurologic: Non Focal.   Skin: No Rash.   Wounds: N/A.   Intake/Output from previous day:  Intake/Output Summary (Last 24 hours) at 07/09/15 1129 Last data filed at 07/09/15 0830  Gross per 24 hour  Intake    720 ml  Output   2026 ml  Net  -1306 ml     LAB RESULTS: CBC  Recent Labs Lab 07/03/15 1313 07/04/15 0445 07/05/15 0355 07/07/15 0504  WBC 6.1 6.7 7.1  --   HGB 7.0* 7.5* 9.0* 8.9*  HCT 27.0* 27.6* 32.7*  31.8* 31.9*  PLT 537* 466* 475*  --   MCV 69.2* 70.4* 70.9*  --   MCH 17.9* 19.1* 19.5*  --   MCHC 25.9* 27.2* 27.5*  --   RDW 22.1* 22.8* 22.4*  --     Chemistries   Recent Labs Lab 07/04/15 0445 07/05/15 0355 07/06/15 0623 07/07/15 0504 07/08/15 0601  NA 143 145 142 143 142  K 3.3* 3.3* 4.1 3.3* 3.7  CL 105 108 108 104 102  CO2 26 27 23 28 28   GLUCOSE 88 93 106* 95 93  BUN 13 14 16 15 15   CREATININE 0.76 0.91 0.80 0.84 0.71  CALCIUM 8.6* 8.7* 8.9 8.3* 8.8*  MG  --   --  1.9  --  1.8    CBG: No results for input(s): GLUCAP in the last 168 hours.  GFR Estimated Creatinine Clearance: 70.2 mL/min (by C-G formula based on Cr of 0.71).  Coagulation profile  Recent Labs Lab 07/05/15 0355 07/06/15 0623 07/07/15 0504 07/08/15 0601 07/09/15 0415  INR 2.05* 1.79* 1.74* 1.82* 1.91*    Cardiac Enzymes No results for input(s): CKMB, TROPONINI, MYOGLOBIN in the last 168 hours.  Invalid input(s):  CK  Invalid input(s): POCBNP No results for input(s): DDIMER in the last 72 hours. No results for input(s): HGBA1C in the last 72 hours. No results for input(s): CHOL, HDL, LDLCALC, TRIG, CHOLHDL, LDLDIRECT in the last 72 hours. No results for input(s): TSH, T4TOTAL, T3FREE, THYROIDAB in the last 72 hours.  Invalid input(s): FREET3 No results for input(s): VITAMINB12, FOLATE, FERRITIN, TIBC, IRON, RETICCTPCT in the last 72 hours. No results for input(s): LIPASE, AMYLASE in the last 72 hours.  Urine Studies No results for input(s): UHGB, CRYS in the last 72 hours.  Invalid input(s): UACOL, UAPR, USPG, UPH, UTP, UGL, UKET, UBIL,  UNIT, UROB, ULEU, UEPI, UWBC, URBC, UBAC, CAST, UCOM, BILUA  MICROBIOLOGY: No results found for this or any previous visit (from the past 240 hour(s)).  RADIOLOGY STUDIES/RESULTS: Dg Chest 2 View  07/05/2015  CLINICAL DATA:  Newly diagnosed heart failure on chronic anticoagulation. Anemia. Exertional dyspnea 1 year. EXAM: CHEST  2 VIEW COMPARISON:  07/03/2015 and 05/09/2015 FINDINGS: Lungs are somewhat hypoinflated without lobar consolidation or effusion. Minimal linear atelectasis/ scarring over the left mid to lower lung. Minimal prominence of the perihilar markings suggesting mild vascular congestion. Mild stable cardiomegaly. Degenerative change of the spine. IMPRESSION: Mild stable cardiomegaly with minimal vascular congestion. Electronically Signed   By: Marin Olp M.D.   On: 07/05/2015 13:51   Dg Chest Portable 1 View  07/03/2015  CLINICAL DATA:  Anemia.  Shortness of breath, fatigue. EXAM: PORTABLE CHEST 1 VIEW COMPARISON:  05/09/2015 FINDINGS: There is cardiac enlargement. Pulmonary vascular congestion is noted. No pleural effusion or edema. No airspace consolidation identified. IMPRESSION: 1. Cardiac enlargement and pulmonary vascular congestion. Electronically Signed   By: Kerby Moors M.D.   On: 07/03/2015 17:15   Signature  Thurnell Lose M.D on  07/09/2015 at 11:29 AM  Between 7am to 7pm - Pager - [phone], After 7pm go to [email or web address] - password [email or web address]  [phone]   LOS: 6 days

## 2015-07-09 NOTE — Progress Notes (Signed)
Pt complain of lightheadedness.  Upon assessment BP 86/50 manually.  Dr. Candiss Norse notified and orders received to administer a 250 mL NS bolus, obtain VS after bolus, and follow up with him.  Will continue to closely monitor.

## 2015-07-09 NOTE — Progress Notes (Signed)
Patient Name: Nicole Gibbs Date of Encounter: 07/09/2015  Primary Cardiologist: Dr. [name]   Principal Problem:   Symptomatic anemia Active Problems:   HTN (hypertension)   Obesity, unspecified   Dyspnea on exertion   Chronic combined systolic and diastolic CHF (congestive heart failure) (HCC)   Thrombocytosis (HCC)   Acute on chronic combined systolic and diastolic congestive heart failure, NYHA class 3 (HCC)   Systolic CHF, chronic (HCC)   Dyspnea on exertion    SUBJECTIVE  She is feeling significantly better, able to walk without assisstance, no chest pain.   CURRENT MEDS . sodium chloride  10 mL/hr Intravenous Once  . ferrous sulfate  325 mg Oral BID WC  . [START ON 07/10/2015] furosemide  80 mg Oral BID  . lisinopril  2.5 mg Oral Daily  . [START ON 07/10/2015] metoprolol succinate  50 mg Oral Daily  . nystatin   Topical TID  . pantoprazole  40 mg Oral BID  . sodium chloride flush  3 mL Intravenous Q12H  . warfarin  4.5 mg Oral ONCE-1800  . Warfarin - Pharmacist Dosing Inpatient   Does not apply q1800    OBJECTIVE  Filed Vitals:   07/08/15 2135 07/09/15 0559 07/09/15 0940 07/09/15 0946  BP: 106/63 105/54 88/45 86/50   Pulse: 70 82 72   Temp: 97.9 F (36.6 C) 98.4 F (36.9 C) 98.8 F (37.1 C)   TempSrc: Oral Oral Oral   Resp: 20 18 18    Height:      Weight:  193 lb 11.2 oz (87.862 kg)    SpO2: 96% 93% 97%     Intake/Output Summary (Last 24 hours) at 07/09/15 1257 Last data filed at 07/09/15 1225  Gross per 24 hour  Intake    720 ml  Output   2326 ml  Net  -1606 ml   Filed Weights   07/07/15 0513 07/08/15 0403 07/09/15 0559  Weight: 202 lb 9.6 oz (91.899 kg) 197 lb 1.6 oz (89.404 kg) 193 lb 11.2 oz (87.862 kg)    PHYSICAL EXAM  General: Pleasant, NAD. Obese Neuro: Alert and oriented X 3. Moves all extremities spontaneously. Psych: Normal affect. HEENT:  Normal  Neck: Supple without bruits or JVD. Lungs:  Resp regular and unlabored. Crackles at  bases Heart: RRR no s3, s4, or murmurs. Abdomen: Soft, non-tender, non-distended, BS + x 4.  Extremities: No clubbing, cyanosis. DP/PT/Radials 2+ and equal bilaterally. 1+ nonpitting edema  Accessory Clinical Findings  CBC  Recent Labs  07/07/15 0504  HGB 8.9*  HCT [id number]*   Basic Metabolic Panel  Recent Labs  07/07/15 0504 07/08/15 0601  NA 143 142  K 3.3* 3.7  CL 104 102  CO2 28 28  GLUCOSE 95 93  BUN 15 15  CREATININE 0.84 0.71  CALCIUM 8.3* 8.8*  MG  --  1.8   Liver Function Tests No results for input(s): AST, ALT, ALKPHOS, BILITOT, PROT, ALBUMIN in the last 72 hours.  TELE NSR with PACs, HR tachy    ECG  No new EKG  Echocardiogram 06/29/2015  LV EF: 35% -  40% Study Conclusions - Left ventricle: Wall thickness was increased in a pattern of mild LVH. Systolic function was moderately reduced. The estimated ejection fraction was in the range of 35% to 40%. - Aortic valve: There was trivial regurgitation. - Mitral valve: Calcified annulus. Moderately thickened leaflets . There was moderate regurgitation. - Left atrium: The atrium was moderately dilated. - Right ventricle: The cavity size was  mildly dilated. Systolic function was mildly reduced.     Radiology/Studies  Dg Chest 2 View  07/05/2015  CLINICAL DATA:  Newly diagnosed heart failure on chronic anticoagulation. Anemia. Exertional dyspnea 1 year. EXAM: CHEST  2 VIEW COMPARISON:  07/03/2015 and 05/09/2015 FINDINGS: Lungs are somewhat hypoinflated without lobar consolidation or effusion. Minimal linear atelectasis/ scarring over the left mid to lower lung. Minimal prominence of the perihilar markings suggesting mild vascular congestion. Mild stable cardiomegaly. Degenerative change of the spine. IMPRESSION: Mild stable cardiomegaly with minimal vascular congestion. Electronically Signed   By: Marin Olp M.D.   On: 07/05/2015 13:51   Dg Chest Portable 1 View  07/03/2015  CLINICAL DATA:   Anemia.  Shortness of breath, fatigue. EXAM: PORTABLE CHEST 1 VIEW COMPARISON:  05/09/2015 FINDINGS: There is cardiac enlargement. Pulmonary vascular congestion is noted. No pleural effusion or edema. No airspace consolidation identified. IMPRESSION: 1. Cardiac enlargement and pulmonary vascular congestion. Electronically Signed   By: Kerby Moors M.D.   On: 07/03/2015 17:15    ASSESSMENT AND PLAN  Ms. Vides is a 70 y.o.female history of chronic combined systolic HF with LVEF [id number] by echo 06/2015, history of PE on chronic coumadin, HTN admitted with symptomatic anemia. From notes she had a prior admission a few months ago also requriring transfusion with negative upper and lower endoscopy. Seen recently in clinic by Ignacia Bayley NP 07/01/15 with increased LE edema, abdominal distension. Lasix increased to 40mg  bid, started on lisionpril 2.5mg  daily. She was arrange for [facility] as outpatient that is scheduled for 07/06/15 with Dr [name]. This was cancelled given her recurrent issues with anemia.   1. Chronic systolic HF in the setting of worsening anemia  - fairly new diagnosis, LVEF 35-40% on echo 06/29/2015  - started on medical therapy, plans for ischemic evaluation with outpatient cath however this has been postponed due to anemia  - medical therapy with lisinonpril 2.5mg  daily and Toprol XL 100mg  daily  -Continue  Lasix 80 mg twice a day, discontinue metolazone 2.5 mg daily as she is hypotensive, this is an increase from her home. She is net -7 L and her weight is down 7 lbs (204 --> 193). Her creatinine continues to improve and she remains volume overloaded.   - she will eventually need a left and right heart cath, but this has been postponed due to ongoing issues of anemia without obvious etiology  2. Tachycardia  - tele shows sinus tach with PACs vs MAT and PVCs  - currently on Toprol XL 100mg  daily   - MAT may be contributing to her ongoing DOE and potentially her LV systolic dysfunction.    3. History of PE  - on chronic coumadin. Managed by pharmacy.  4. Recurrent anemia s/p transfusion: s/p EGD and colonoscopy on 05/08/2015, no obvious source of bleeding.   - anemia panel 07/03/2015, Iron 14, TIBC 454, Sat ratio 3. H/H: 8.9/31.9. Continue iron supplementation.  She is ready for a discharge tomorrow, we will arrange for an outpatient follow up.  Dorothy Spark 07/09/2015

## 2015-07-09 NOTE — Progress Notes (Signed)
ANTICOAGULATION CONSULT NOTE - [place] for coumadin  Indication: hx PE and DVT  Allergies  Allergen Reactions  . Keflex [Cephalexin] Diarrhea  . Sulfa Antibiotics Hives    Patient Measurements: Height: 5\' 2"  (157.5 cm) Weight: 193 lb 11.2 oz (87.862 kg) IBW/kg (Calculated) : 50.1  Vital Signs: Temp: 98.4 F (36.9 C) (02/23 0559) Temp Source: Oral (02/23 0559) BP: 105/54 mmHg (02/23 0559) Pulse Rate: 82 (02/23 0559)  Labs:  Recent Labs  07/07/15 0504 07/08/15 0601 07/09/15 0415  HGB 8.9*  --   --   HCT 31.9*  --   --   LABPROT 20.3* 21.1* 21.8*  INR 1.74* 1.82* 1.91*  CREATININE 0.84 0.71  --     Estimated Creatinine Clearance: 70.2 mL/min (by C-G formula based on Cr of 0.71).   Medical History: Past Medical History  Diagnosis Date  . Thyroid nodule     no meds  . Hypertensive heart disease   . Pulmonary embolism, bilateral (HCC)     a. chronic coumadin.  . DVT, bilateral lower limbs ([place])   . Arthritis     knees  . Postmenopausal vaginal bleeding   . Rash     under breasts  . Nocturia   . Obese   . Chronic combined systolic and diastolic CHF (congestive heart failure) ([place])     a. 07/2012 Echo: EF 55-60%, Gr1 DD;  b. 06/2015 Echo: EF 35-40%, triv AI, Mod MR, mod dil LA, mildly reduced RV.  . H/O cardiovascular stress test     a. 07/2012 MV: fixed mid-dist anterior and apical defect - scar vs attenuation, distal ant HK, no reversibility, EF 51%-->low risk.    Medications:  Prescriptions prior to admission  Medication Sig Dispense Refill Last Dose  . acetaminophen (TYLENOL) 325 MG tablet Take 650 mg by mouth every 6 (six) hours as needed for moderate pain.   Past Month at Unknown time  . enoxaparin (LOVENOX) 100 MG/ML injection Give one injection on Saturday night. Then 1 injection Sunday AM and PM. 3 Syringe 0 not started  . lisinopril (PRINIVIL,ZESTRIL) 2.5 MG tablet Take 1 tablet (2.5 mg total) by mouth daily. 30 tablet 6  07/02/2015 at Unknown time  . metoprolol (LOPRESSOR) 50 MG tablet Take 2 tablets in the morning and 1 tablet in the evening. (Patient taking differently: Take 50 mg by mouth daily. ) 270 tablet 1 07/03/2015 at 1000  . nitroGLYCERIN (NITROSTAT) 0.4 MG SL tablet Place 1 tablet (0.4 mg total) under the tongue every 5 (five) minutes as needed for chest pain. 25 tablet 3 not used  . zolpidem (AMBIEN) 5 MG tablet Take 1 tablet (5 mg total) by mouth at bedtime as needed for sleep. 30 tablet 0 07/02/2015 at Unknown time  . [DISCONTINUED] furosemide (LASIX) 40 MG tablet Take 1 tablet (40 mg total) by mouth 2 (two) times daily. 60 tablet 6 07/02/2015 at Unknown time  . warfarin (COUMADIN) 3 MG tablet TAKE 1 TO 1 AND 1/2 TABLETS BY MOUTH DAILY OR AS DIRECTED (Patient taking differently: takes 4.5mg  only on sun, takes 3mg  all other days) 120 tablet 1 on hold  . [DISCONTINUED] potassium chloride SA (K-DUR,KLOR-CON) 20 MEQ tablet Take 1 tablet (20 mEq total) by mouth 2 (two) times daily. (Patient not taking: Reported on 07/03/2015) 60 tablet 6 Not Taking at Unknown time   Scheduled:  . sodium chloride  10 mL/hr Intravenous Once  . ferrous sulfate  325 mg Oral BID WC  .  furosemide  80 mg Oral BID  . lisinopril  2.5 mg Oral Daily  . metolazone  2.5 mg Oral Daily  . metoprolol succinate  100 mg Oral Daily  . pantoprazole  40 mg Oral BID  . sodium chloride flush  3 mL Intravenous Q12H  . Warfarin - Pharmacist Dosing Inpatient   Does not apply q1800    Assessment: 70 yo female here with anemia on coumadin PTA for hx PE and DVT. Coumadin was on hold as outpatient with plans for cath but cath plans have been cancelled due to anemia.   INR= 1.91 with trend up.    Home coumadin dose: 4.5 mg on Sunday, 3 mg all other days (last clinic visit 2/15)  Goal of Therapy:  INR 2-3 Monitor platelets by anticoagulation protocol: Yes   Plan:  -Coumadin 4.5mg  po today -When discharged would consider resuming home dose (4.5  mg on Sunday, 3 mg all other days) -Daily PT/INR  Hildred Laser, Pharm D 07/09/2015 8:05 AM

## 2015-07-09 NOTE — Progress Notes (Signed)
Patient had 3 beats V-tach at 21:22. Patient is asymptomatic. Will continue to monitor.

## 2015-07-10 LAB — PROTIME-INR
INR: 2.07 — ABNORMAL HIGH (ref 0.00–1.49)
Prothrombin Time: 23.2 seconds — ABNORMAL HIGH (ref 11.6–15.2)

## 2015-07-10 MED ORDER — FUROSEMIDE 40 MG PO TABS: 40.0000 mg | ORAL_TABLET | Freq: Two times a day (BID) | ORAL | Status: DC

## 2015-07-10 MED ORDER — SODIUM CHLORIDE 0.9 % IV BOLUS (SEPSIS)
500.0000 mL | Freq: Once | INTRAVENOUS | Status: AC
  Administered 2015-07-10: 500 mL via INTRAVENOUS

## 2015-07-10 MED ORDER — POTASSIUM CHLORIDE CRYS ER 20 MEQ PO TBCR: 40.0000 meq | EXTENDED_RELEASE_TABLET | Freq: Every day | ORAL | Status: DC

## 2015-07-10 MED ORDER — METOPROLOL SUCCINATE ER 50 MG PO TB24: 50.0000 mg | ORAL_TABLET | Freq: Every day | ORAL | Status: DC

## 2015-07-10 NOTE — Progress Notes (Signed)
Patient being discharged per MD order, all discharge instructions given to patient and family.

## 2015-07-10 NOTE — Progress Notes (Signed)
PT Cancellation Note  Patient Details Name: Maanvi Wentzell MRN: [id number] DOB: 05/31/1945   Cancelled Treatment:    Reason Eval/Treat Not Completed: Other (comment) (Pt preparing for d/c)   Joslyn Hy PT, DPT [phone] Pager: [phone] 07/10/2015, 1:18 PM

## 2015-07-10 NOTE — Care Management Important Message (Signed)
Important Message  Patient Details  Name: Nicole Gibbs MRN: [id number] Date of Birth: October 23, 1945   Medicare Important Message Given:  Yes    [name] May 07/10/2015, 11:50 AM

## 2015-07-10 NOTE — Plan of Care (Signed)
Problem: Food- and Nutrition-Related Knowledge Deficit (NB-1.1) Goal: Nutrition education Formal process to instruct or train a patient/client in a skill or to impart knowledge to help patients/clients voluntarily manage or modify food choices and eating behavior to maintain or improve health. Outcome: Completed/Met Date Met:  07/10/15 Nutrition Education Note  RD consulted for nutrition education regarding new onset CHF.  RD provided "Low Sodium Nutrition Therapy" handout from the Academy of [employer]. Reviewed patient's dietary recall. Provided examples on ways to decrease sodium intake in diet. Discouraged intake of processed foods and use of salt shaker. Encouraged fresh fruits and vegetables as well as whole grain sources of carbohydrates to maximize fiber intake.   RD discussed why it is important for patient to adhere to diet recommendations, and emphasized the role of fluids, foods to avoid, and importance of weighing self daily. Teach back method used.  Expect poor compliance. Patient is concerned that her family members will not be happy with the dietary changes she needs to make. They eat a lot of fast food. Discussed healthier options when eating out. Patient seemed very overwhelmed.  Body mass index is 35.42 kg/(m^2). Pt meets criteria for obesity based on current BMI.  Current diet order is heart healthy, patient is consuming approximately 100% of meals at this time. Labs and medications reviewed. No further nutrition interventions warranted at this time. RD contact information provided. If additional nutrition issues arise, please re-consult RD.   Molli Barrows, RD, LDN, [place] Pager [phone] After Hours Pager [phone]

## 2015-07-10 NOTE — Progress Notes (Addendum)
Orthostatic BP following a ordered 500cc IV bolus:  Lying:  102/74 , HR 90  Sitting :  94/62 . HR 90  Standing: 108/67, HR 105  3 min standing: 125/104 , HR 102  MD paged with results   Ambulated in the hall x 100 ft, HR maintained between 100-105, Bp on return to room was 115/61. Notified MD.

## 2015-07-10 NOTE — Progress Notes (Signed)
ReDS Vest Discharge Study  Results of ReDS reading  Your patient is in the Blinded arm of the Vest at Discharge study.  Your patient has had a ReDS Vest reading and the reading has been transmitted to the cloud.  Your patient is ok for discharge.    Thank You   The research team    

## 2015-07-12 DIAGNOSIS — M6281 Muscle weakness (generalized): Secondary | ICD-10-CM | POA: Diagnosis not present

## 2015-07-12 DIAGNOSIS — I82403 Acute embolism and thrombosis of unspecified deep veins of lower extremity, bilateral: Secondary | ICD-10-CM | POA: Diagnosis not present

## 2015-07-12 DIAGNOSIS — Z86711 Personal history of pulmonary embolism: Secondary | ICD-10-CM | POA: Diagnosis not present

## 2015-07-12 DIAGNOSIS — D62 Acute posthemorrhagic anemia: Secondary | ICD-10-CM | POA: Diagnosis not present

## 2015-07-12 DIAGNOSIS — M170 Bilateral primary osteoarthritis of knee: Secondary | ICD-10-CM | POA: Diagnosis not present

## 2015-07-12 DIAGNOSIS — I10 Essential (primary) hypertension: Secondary | ICD-10-CM | POA: Diagnosis not present

## 2015-07-13 ENCOUNTER — Other Ambulatory Visit: Payer: Self-pay | Admitting: *Deleted

## 2015-07-13 DIAGNOSIS — M6281 Muscle weakness (generalized): Secondary | ICD-10-CM | POA: Diagnosis not present

## 2015-07-13 DIAGNOSIS — M170 Bilateral primary osteoarthritis of knee: Secondary | ICD-10-CM | POA: Diagnosis not present

## 2015-07-13 DIAGNOSIS — I5043 Acute on chronic combined systolic (congestive) and diastolic (congestive) heart failure: Secondary | ICD-10-CM | POA: Diagnosis not present

## 2015-07-13 DIAGNOSIS — E669 Obesity, unspecified: Secondary | ICD-10-CM | POA: Diagnosis not present

## 2015-07-13 DIAGNOSIS — D649 Anemia, unspecified: Secondary | ICD-10-CM | POA: Diagnosis not present

## 2015-07-13 DIAGNOSIS — I82403 Acute embolism and thrombosis of unspecified deep veins of lower extremity, bilateral: Secondary | ICD-10-CM | POA: Diagnosis not present

## 2015-07-13 DIAGNOSIS — Z7901 Long term (current) use of anticoagulants: Secondary | ICD-10-CM | POA: Diagnosis not present

## 2015-07-13 DIAGNOSIS — Z86711 Personal history of pulmonary embolism: Secondary | ICD-10-CM | POA: Diagnosis not present

## 2015-07-13 DIAGNOSIS — I110 Hypertensive heart disease with heart failure: Secondary | ICD-10-CM | POA: Diagnosis not present

## 2015-07-13 DIAGNOSIS — I509 Heart failure, unspecified: Secondary | ICD-10-CM | POA: Diagnosis not present

## 2015-07-13 DIAGNOSIS — I5040 Unspecified combined systolic (congestive) and diastolic (congestive) heart failure: Secondary | ICD-10-CM

## 2015-07-13 NOTE — Patient Outreach (Signed)
[facility]) Care Management  07/13/2015  Nicole Gibbs 1945-08-18 [id number]   Assessment: Transition of care - week 1 Referral received from hospital liaison (J. Minor) for transition of care and evaluate for monthly visits, assess for post hospital meals and transportation needs.  69 year old female with recent hospitalization on 2/17- 2/24 for anemia (asymptomatic) and congestive heart failure (new).  Call placed and spoke with patient in a short period of time since she is getting ready for a doctor's appointment (Dr. [name]) scheduled today as stated.  Care management coordinator introduced self and explained purpose of the call.   Patient reports that she lives with her spouse Nicole Gibbs) who is completely bed-ridden in a hospital bed with progressive MS (multiple sclerosis). She states that husband relies care from [facility] caregiver for 10 years. She states that "meals on wheels" is provided for husband. Her daughter lives with them for now as stated by patient.  Patient reports that husband's caregiver will be providing transportation to her doctor's appointment today. Patient verbalized transportation needs to other follow-up appointments and needs with meals as well. She agreed to be referred to [place] worker for transportation needs and meals as well as community resources available for her. Nicole Gibbs has scheduled appointments with Dr. Cathie Olden (cardiology) on 07/15/15 and Dr. Penelope Coop (gastroenterology) on 07/21/15.  [facility] home health physical therapy and nurse will be providing services for patient at home as reported. Patient verbalized that she was put on a low salt diet with fluid restriction of 1.5 liters per day which is new to her. She manages her own medications and states that she is missing one of her medications (Potassium) which she will be picking up today as reported.  She reports no evidence of bleeding noted so far.  Patient was encouraged to call  [facility] management coordinator or 24-hour nurse  line if needed. Contact information provided.   Plan: Will call patient back to set-up a schedule for initial home visit when available. Will refer to Nicole Gibbs worker for meals and transportation needs and other community resources she can avail.      Addendum:  Call placed to patient to set-up an initial home visit but unable to reach her. HIPAA compliant voice message left with name and contact information.  Plan: Will await for a return call. If unable to receive a call back, will schedule for next outreach call.    Delenn Ahn A. Barby Colvard, BSN, RN-BC [place] Management Coordinator Cell: [phone]

## 2015-07-14 ENCOUNTER — Encounter: Payer: Self-pay | Admitting: *Deleted

## 2015-07-14 DIAGNOSIS — D649 Anemia, unspecified: Secondary | ICD-10-CM | POA: Diagnosis not present

## 2015-07-14 DIAGNOSIS — I5043 Acute on chronic combined systolic (congestive) and diastolic (congestive) heart failure: Secondary | ICD-10-CM | POA: Diagnosis not present

## 2015-07-14 DIAGNOSIS — M170 Bilateral primary osteoarthritis of knee: Secondary | ICD-10-CM | POA: Diagnosis not present

## 2015-07-14 DIAGNOSIS — I82403 Acute embolism and thrombosis of unspecified deep veins of lower extremity, bilateral: Secondary | ICD-10-CM | POA: Diagnosis not present

## 2015-07-14 DIAGNOSIS — I110 Hypertensive heart disease with heart failure: Secondary | ICD-10-CM | POA: Diagnosis not present

## 2015-07-14 DIAGNOSIS — M6281 Muscle weakness (generalized): Secondary | ICD-10-CM | POA: Diagnosis not present

## 2015-07-15 ENCOUNTER — Encounter: Payer: Self-pay | Admitting: Nurse Practitioner

## 2015-07-15 ENCOUNTER — Ambulatory Visit (INDEPENDENT_AMBULATORY_CARE_PROVIDER_SITE_OTHER): Payer: Medicare Other | Admitting: Nurse Practitioner

## 2015-07-15 ENCOUNTER — Encounter: Payer: Medicare Other | Admitting: Pharmacist Clinician (PhC)/ Clinical Pharmacy Specialist

## 2015-07-15 ENCOUNTER — Ambulatory Visit (INDEPENDENT_AMBULATORY_CARE_PROVIDER_SITE_OTHER): Payer: Medicare Other | Admitting: Pharmacist

## 2015-07-15 VITALS — BP 120/60 | HR 68 | Ht 62.0 in | Wt 192.1 lb

## 2015-07-15 DIAGNOSIS — D509 Iron deficiency anemia, unspecified: Secondary | ICD-10-CM

## 2015-07-15 DIAGNOSIS — I509 Heart failure, unspecified: Secondary | ICD-10-CM | POA: Diagnosis not present

## 2015-07-15 DIAGNOSIS — I5041 Acute combined systolic (congestive) and diastolic (congestive) heart failure: Secondary | ICD-10-CM

## 2015-07-15 DIAGNOSIS — I110 Hypertensive heart disease with heart failure: Secondary | ICD-10-CM | POA: Diagnosis not present

## 2015-07-15 DIAGNOSIS — I82403 Acute embolism and thrombosis of unspecified deep veins of lower extremity, bilateral: Secondary | ICD-10-CM

## 2015-07-15 DIAGNOSIS — Z7901 Long term (current) use of anticoagulants: Secondary | ICD-10-CM | POA: Diagnosis not present

## 2015-07-15 DIAGNOSIS — R0600 Dyspnea, unspecified: Secondary | ICD-10-CM | POA: Diagnosis not present

## 2015-07-15 DIAGNOSIS — I2782 Chronic pulmonary embolism: Secondary | ICD-10-CM

## 2015-07-15 DIAGNOSIS — I82409 Acute embolism and thrombosis of unspecified deep veins of unspecified lower extremity: Secondary | ICD-10-CM | POA: Diagnosis not present

## 2015-07-15 LAB — BASIC METABOLIC PANEL
BUN: 29 mg/dL — ABNORMAL HIGH (ref 7–25)
CO2: 31 mmol/L (ref 20–31)
Calcium: 8.8 mg/dL (ref 8.6–10.4)
Chloride: 98 mmol/L (ref 98–110)
Creat: 0.92 mg/dL (ref 0.50–0.99)
Glucose, Bld: 135 mg/dL — ABNORMAL HIGH (ref 65–99)
Potassium: 3.5 mmol/L (ref 3.5–5.3)
Sodium: 143 mmol/L (ref 135–146)

## 2015-07-15 LAB — POCT INR: INR: 2.2

## 2015-07-15 MED ORDER — LISINOPRIL 2.5 MG PO TABS: 2.5000 mg | ORAL_TABLET | Freq: Every day | ORAL | Status: DC

## 2015-07-15 NOTE — Patient Instructions (Addendum)
We will be checking the following labs today - BMET, CBC and BNP  Try to get to INR done here today   Medication Instructions:    Continue with your current medicines. BUT  I have cut the metoprolol to just one pill twice a day  I would like for you to take the Lisinopril just once a day  I would like for you to take Lasix just once a day    Testing/Procedures To Be Arranged:  Myoview - Lexiscan  Follow-Up:   See Dr. [name] as planned in April  Referral to Hematology for anemia    Other Special Instructions:   N/A    If you need a refill on your cardiac medications before your next appointment, please call your pharmacy.   Call the [place] office at [phone] if you have any questions, problems or concerns.

## 2015-07-15 NOTE — Progress Notes (Signed)
CARDIOLOGY OFFICE NOTE  Date:  07/15/2015    [name] Date of Birth: 1945/07/11 Medical Record [id number]  PCP:  [name], MD  Cardiologist:  [name]    Chief Complaint  Patient presents with  . Cardiomyopathy  . Congestive Heart Failure    Post hospital visit - seen for Dr. [name]    History of Present Illness: Jatinder Gemberling is a 70 y.o. female who presents today for a post hospital visit. Seen for Dr. [name].   She has a very complex past medical history. She has a history of hypertension with previously normal LV function and low risk stress testing back in March 2014. She also has a history of DVT and pulmonary embolism dating back to 2014, for which she has been on chronic Coumadin anticoagulation.   In December 2016, she was admitted to [facility] secondary to melena, dyspnea, and finding of a hemoglobin of 6.2. She underwent EGD and colonoscopy revealing no evidence of acute bleeding. She was transfused 4 units of packed red blood cells and placed on PPI therapy. She was subsequently discharged and she says that following discharge, she continued to feel weak and then began to express marked dyspnea on exertion followed by significant lower extremity edema and got edema/increasing abdominal girth. She was seen in clinic on January 30 and noted to have significant volume overload. There was also significant concern about anemia. Labs drawn that day showed normal renal function with mild hypokalemia. Her BNP was elevated at 685. Hemoglobin was 8.1 with a hematocrit of 29.8. MCV and MCH were low at 72 and 19.6 respectively. These numbers were down since hospitalization but not significantly so. She was placed on Lasix 40 mg daily and scheduled for an echocardiogram. There was also some concern that her lower extremity swelling may be related to cellulitis and she was given a prescription for Keflex. She never ended up taking the Keflex as she remembered a history of hives when taking it  in the past.   Echocardiogram was performed on February 13, and revealed new LV dysfunction with an EF of 35-40%. She also had moderate mitral regurgitation. Follow-up labs the same day continued to show stable renal function and mild hypokalemia. Since her visit back here in January, she had lost roughly 25 pounds with improvement in lower extremity swelling and abdominal girth, though she continues to note more lower extremity edema than she has been accustomed to in the past. Her legs are no longer reddened. She continues to experience significant dyspnea on exertion but denied PND, orthopnea, dizziness, syncope, or early satiety. She has never had chest pain.   She was seen by Ignacia Bayley, PA for follow up of the echo and CHF - referred on for cardiac cath. Lasix was increased due to persistent volume overload. Pre procedural labs showed HGB back down to 7. Also with thrombocytosis. Was advised to go to the hospital. She was transfused again. [facility] wanted to do capsule endoscopy as outpatient. Received IV iron and placed on oral supplementation. She remains on her coumadin. Supposedly has been told that she needs lifelong anticoagulation. She was to be referred to hematology. Did have runs of MAT and had beta blocker dose adjusted.   Comes back today. Here alone. Now with Dr. Maudie Mercury as primary care. Not on her ACE - says she just found the bottle under her bed - so she has not been taking. Only taking 2 tablets total of metoprolol instead of  3 - says this is because it "was knocking her out". She is only using Lasix in the AM and not twice a day. Does not see [facility] until next week. Has not been referred to hematology. Says her breathing is "90%" better. Legs still quite full but with less swelling. She is worried about trying to stick to a diet. Does have some DOE but sounds like this improved. No chest pain whatsoever.    Past Medical History  Diagnosis Date  . Thyroid nodule     no meds  .  Hypertensive heart disease   . Pulmonary embolism, bilateral (HCC)     a. chronic coumadin.  . DVT, bilateral lower limbs ([place])   . Arthritis     knees  . Postmenopausal vaginal bleeding   . Rash     under breasts  . Nocturia   . Obese   . Chronic combined systolic and diastolic CHF (congestive heart failure) ([place])     a. 07/2012 Echo: EF 55-60%, Gr1 DD;  b. 06/2015 Echo: EF 35-40%, triv AI, Mod MR, mod dil LA, mildly reduced RV.  . H/O cardiovascular stress test     a. 07/2012 MV: fixed mid-dist anterior and apical defect - scar vs attenuation, distal ant HK, no reversibility, EF 51%-->low risk.    Past Surgical History  Procedure Laterality Date  . Cesarean section  1992  . Hysteroscopy w/d&c  03-24-2009  . Dilation and curettage of uterus  06/20/2012    Procedure: DILATATION AND CURETTAGE;  Surgeon: [name], MD;  Location: [facility];  Service: Gynecology;  Laterality: N/A;  . Dilation and evacution  1988    missed ab  . Cardiovascular stress test  08-07-2012  DR HILTY/ DR BERRY    LOW RISK NUCLEAR STUDY/ NO ISCHEMIA/ FIXED MID TO DISTAL ANTERIOR AND APICAL DEFECT MAY REPRESENT SCAR VS BREAST ATTENUATION ARTIFACT/  MILD DISTAL ANTERIOR HYPOKINESIS/ EF 51%  . Transthoracic echocardiogram  08-08-2012    LVSF NORMAL/ EF [id number]  GRADE I DIASTOLIC DYSFUNCTION/ MILD AV AND MV REGURG./  RVSF MILDLY REDUCED  . Dilitation & currettage/hystroscopy with thermachoice ablation N/A 09/04/2012    Procedure: DILATATION & CURETTAGE WITH THERMACHOICE ABLATION;  Surgeon: [name], MD;  Location: [facility];  Service: Gynecology;  Laterality: N/A;  . Esophagogastroduodenoscopy N/A 05/07/2015    Procedure: ESOPHAGOGASTRODUODENOSCOPY (EGD);  Surgeon: Laurence Spates, MD;  [facility];  Service: Endoscopy;  Laterality: N/A;  . Colonoscopy N/A 05/08/2015    Procedure: COLONOSCOPY;  Surgeon: Laurence Spates, MD;  Location: [facility];  Service: Endoscopy;  Laterality:  N/A;     Medications: Current Outpatient Prescriptions  Medication Sig Dispense Refill  . acetaminophen (TYLENOL) 325 MG tablet Take 650 mg by mouth every 6 (six) hours as needed for moderate pain.    . ferrous sulfate 325 (65 FE) MG tablet Take 1 tablet (325 mg total) by mouth 2 (two) times daily with a meal. 60 tablet 0  . furosemide (LASIX) 40 MG tablet Take 40 mg by mouth daily.    . metoprolol (LOPRESSOR) 50 MG tablet Take 50 mg by mouth as directed. 50 mg BID    . nitroGLYCERIN (NITROSTAT) 0.4 MG SL tablet Place 1 tablet (0.4 mg total) under the tongue every 5 (five) minutes as needed for chest pain. 25 tablet 3  . nystatin (MYCOSTATIN/NYSTOP) 100000 UNIT/GM POWD Apply to your groin TID 15 g 0  . pantoprazole (PROTONIX) 40 MG tablet Take 1 tablet (  40 mg total) by mouth 2 (two) times daily. 60 tablet 0  . potassium chloride SA (K-DUR,KLOR-CON) 20 MEQ tablet Take 2 tablets (40 mEq total) by mouth daily. 30 tablet 0  . warfarin (COUMADIN) 3 MG tablet TAKE 1 TO 1 AND 1/2 TABLETS BY MOUTH DAILY OR AS DIRECTED (Patient taking differently: takes 4.5mg  only on sun, takes 3mg  all other days) 120 tablet 1  . zolpidem (AMBIEN) 5 MG tablet Take 1 tablet (5 mg total) by mouth at bedtime as needed for sleep. 30 tablet 0  . lisinopril (PRINIVIL,ZESTRIL) 2.5 MG tablet Take 1 tablet (2.5 mg total) by mouth daily. 30 tablet 6   No current facility-administered medications for this visit.    Allergies: Allergies  Allergen Reactions  . Keflex [Cephalexin] Diarrhea  . Sulfa Antibiotics Hives    Social History: The patient  reports that she has never smoked. She has never used smokeless tobacco. She reports that she does not drink alcohol or use illicit drugs.   Family History: The patient's family history includes Coronary artery disease (age of onset: 80) in her father; Hypertension in her mother; Mental illness in her mother; Stroke in her father; Sudden death in her sister.   Review of  Systems: Please see the history of present illness.   Otherwise, the review of systems is positive for none.   All other systems are reviewed and negative.   Physical Exam: VS:  BP 120/60 mmHg  Pulse 68  Ht 5\' 2"  (1.575 m)  Wt 192 lb 1.9 oz (87.145 kg)  BMI 35.13 kg/m2 .  BMI Body mass index is 35.13 kg/(m^2).  Wt Readings from Last 3 Encounters:  07/15/15 192 lb 1.9 oz (87.145 kg)  07/10/15 193 lb 11.2 oz (87.862 kg)  07/01/15 211 lb (95.709 kg)    General: Pleasant. Elderly female who looks chronically ill but in no acute distress.  HEENT: Normal. Neck: Supple, no JVD, carotid bruits, or masses noted.  Cardiac: Regular rate and rhythm. No murmurs, rubs, or gallops. Legs are very full but with less edema Respiratory:  Lungs are clear to auscultation bilaterally with normal work of breathing.  GI: Obese but soft and nontender.  MS: No deformity or atrophy. Gait and ROM intact. Skin: Warm and dry. Color is normal.  Neuro:  Strength and sensation are intact and no gross focal deficits noted.  Psych: Alert, appropriate and with normal affect.   LABORATORY DATA:  EKG:  EKG is not ordered today.  Lab Results  Component Value Date   WBC 7.1 07/05/2015   HGB 8.9* 07/07/2015   HCT 31.9* 07/07/2015   PLT 475* 07/05/2015   GLUCOSE 93 07/08/2015   ALT 13* 07/03/2015   AST 22 07/03/2015   NA 142 07/08/2015   K 3.7 07/08/2015   CL 102 07/08/2015   CREATININE 0.71 07/08/2015   BUN 15 07/08/2015   CO2 28 07/08/2015   TSH 0.368 08/07/2012   INR 2.07* 07/10/2015    BNP (last 3 results)  Recent Labs  06/15/15 1044 07/05/15 0355  BNP 685.5* 811.5*    ProBNP (last 3 results) No results for input(s): PROBNP in the last 8760 hours.   Other Studies Reviewed Today:  Echo Study Conclusions from 06/2015  - Left ventricle: Wall thickness was increased in a pattern of mild LVH. Systolic function was moderately reduced. The estimated ejection fraction was in the range of  35% to 40%. - Aortic valve: There was trivial regurgitation. - Mitral valve: Calcified  annulus. Moderately thickened leaflets . There was moderate regurgitation. - Left atrium: The atrium was moderately dilated. - Right ventricle: The cavity size was mildly dilated. Systolic function was mildly reduced.  Assessment/Plan: 1. Acute combined systolic and diastolic congestive heart failure/new cardiomyopathy: Patient has been experiencing worsening dyspnea exertion with significant lower extremity swelling and increasing abdominal girth since earlier this year. She was seen in January and placed on Lasix after being noted to be significantly volume overloaded. She has had significant weight loss and improvement in volume status but still feels that her lower extremity edema is greater than what she is accustomed to. Recent echo showed LV dysfunction with an EF of 35-40%. She also had moderate mitral regurgitation. She was going to have cardiac cath but labs showed marked anemia - ended up being readmitted and transfused again.   She does look better. I think we need this blood disorder/bleeding more defined. I would hold on cardiac cath. Will arrange for [facility]. She really did not want to proceed with a cath as well.   I have asked her to take the Lisinopril 2.5 mg daily. Will leave her on metoprolol 50 mg BID and just one Lasix 40 mg a day. Lab today.   2. Hypertensive heart disease: Blood pressure is stable on beta blocker.   3. History of pulmonary embolus and DVT: Patient is on chronic Coumadin anticoagulation. I worry about her being on coumadin - maybe we need to consider vena cava filter. Recheck labs today. Sending to hematology and she sees GI next week.   Current medicines are reviewed with the patient today.  The patient does not have concerns regarding medicines other than what has been noted above.  The following changes have been made:  See above.  Labs/ tests ordered  today include:    Orders Placed This Encounter  Procedures  . Brain natriuretic peptide  . Basic metabolic panel  . CBC  . Ambulatory referral to Hematology  . Myocardial Perfusion Imaging     Disposition:   FU with Dr. [name] in early April.   Patient is agreeable to this plan and will call if any problems develop in the interim.   Signed: Burtis Junes, RN, ANP-C 07/15/2015 11:15 AM  [place] [address] [place], [place]  [address] Phone: [phone] Fax: [phone]

## 2015-07-16 ENCOUNTER — Telehealth: Payer: Self-pay

## 2015-07-16 ENCOUNTER — Encounter: Payer: Self-pay | Admitting: *Deleted

## 2015-07-16 DIAGNOSIS — M6281 Muscle weakness (generalized): Secondary | ICD-10-CM | POA: Diagnosis not present

## 2015-07-16 DIAGNOSIS — I110 Hypertensive heart disease with heart failure: Secondary | ICD-10-CM | POA: Diagnosis not present

## 2015-07-16 DIAGNOSIS — M170 Bilateral primary osteoarthritis of knee: Secondary | ICD-10-CM | POA: Diagnosis not present

## 2015-07-16 DIAGNOSIS — I5043 Acute on chronic combined systolic (congestive) and diastolic (congestive) heart failure: Secondary | ICD-10-CM | POA: Diagnosis not present

## 2015-07-16 DIAGNOSIS — D649 Anemia, unspecified: Secondary | ICD-10-CM | POA: Diagnosis not present

## 2015-07-16 DIAGNOSIS — I82403 Acute embolism and thrombosis of unspecified deep veins of lower extremity, bilateral: Secondary | ICD-10-CM | POA: Diagnosis not present

## 2015-07-16 LAB — CBC

## 2015-07-16 NOTE — Telephone Encounter (Signed)
Error

## 2015-07-16 NOTE — Telephone Encounter (Signed)
Mickel Baas from [facility] home [facility] called. She wanted to speak with Dr. Tonette Bihari nurse. I informed her Dr. Ouida Sills does not work at our facility anymore. Not sure who is taking over pt's care. Referral was entered for home health care. Mickel Baas wanted to inform us that pt wants physical therapy/home health evaluation moved to next week. Mickel Baas said they will move evaluation to next week and give an update afterwards.    Laura's call back number: [phone]

## 2015-07-16 NOTE — Telephone Encounter (Signed)
FYI Dr. Ouida Sills pt.

## 2015-07-16 NOTE — Telephone Encounter (Addendum)
THIS MESSAGE IS FROM MILLIE ([place]) FROM [facility]. SHE CALLED TO LET DR. ANDERSON KNOW THAT SHE HAS HAD SEVERAL MISSED VISITS WITH THIS PATIENT. SHE IS IN THE HOME BUT WILL NOT ANSWER THE DOOR.  BEST PHONE IF QUESTIONS: [phone] [facility]  [place]

## 2015-07-17 ENCOUNTER — Encounter: Payer: Self-pay | Admitting: Hematology

## 2015-07-20 ENCOUNTER — Other Ambulatory Visit (INDEPENDENT_AMBULATORY_CARE_PROVIDER_SITE_OTHER): Payer: Medicare Other | Admitting: *Deleted

## 2015-07-20 ENCOUNTER — Other Ambulatory Visit: Payer: Self-pay | Admitting: *Deleted

## 2015-07-20 DIAGNOSIS — M170 Bilateral primary osteoarthritis of knee: Secondary | ICD-10-CM | POA: Diagnosis not present

## 2015-07-20 DIAGNOSIS — N390 Urinary tract infection, site not specified: Secondary | ICD-10-CM | POA: Diagnosis not present

## 2015-07-20 DIAGNOSIS — I509 Heart failure, unspecified: Secondary | ICD-10-CM | POA: Diagnosis not present

## 2015-07-20 DIAGNOSIS — I82409 Acute embolism and thrombosis of unspecified deep veins of unspecified lower extremity: Secondary | ICD-10-CM | POA: Diagnosis not present

## 2015-07-20 DIAGNOSIS — D649 Anemia, unspecified: Secondary | ICD-10-CM | POA: Diagnosis not present

## 2015-07-20 DIAGNOSIS — M6281 Muscle weakness (generalized): Secondary | ICD-10-CM | POA: Diagnosis not present

## 2015-07-20 DIAGNOSIS — I110 Hypertensive heart disease with heart failure: Secondary | ICD-10-CM | POA: Diagnosis not present

## 2015-07-20 DIAGNOSIS — I82403 Acute embolism and thrombosis of unspecified deep veins of lower extremity, bilateral: Secondary | ICD-10-CM | POA: Diagnosis not present

## 2015-07-20 DIAGNOSIS — R0602 Shortness of breath: Secondary | ICD-10-CM | POA: Diagnosis not present

## 2015-07-20 DIAGNOSIS — I10 Essential (primary) hypertension: Secondary | ICD-10-CM

## 2015-07-20 DIAGNOSIS — I5042 Chronic combined systolic (congestive) and diastolic (congestive) heart failure: Secondary | ICD-10-CM

## 2015-07-20 DIAGNOSIS — I2601 Septic pulmonary embolism with acute cor pulmonale: Secondary | ICD-10-CM | POA: Diagnosis not present

## 2015-07-20 DIAGNOSIS — I5043 Acute on chronic combined systolic (congestive) and diastolic (congestive) heart failure: Secondary | ICD-10-CM | POA: Diagnosis not present

## 2015-07-20 DIAGNOSIS — I82401 Acute embolism and thrombosis of unspecified deep veins of right lower extremity: Secondary | ICD-10-CM | POA: Diagnosis not present

## 2015-07-20 LAB — BASIC METABOLIC PANEL
BUN: 19 mg/dL (ref 7–25)
CO2: 30 mmol/L (ref 20–31)
Calcium: 9.3 mg/dL (ref 8.6–10.4)
Chloride: 100 mmol/L (ref 98–110)
Creat: 0.84 mg/dL (ref 0.50–0.99)
Glucose, Bld: 102 mg/dL — ABNORMAL HIGH (ref 65–99)
Potassium: 3.9 mmol/L (ref 3.5–5.3)
Sodium: 143 mmol/L (ref 135–146)

## 2015-07-20 NOTE — Addendum Note (Signed)
Addended by: Eulis Foster on: 07/20/2015 11:20 AM   Modules accepted: Orders

## 2015-07-20 NOTE — Telephone Encounter (Signed)
Nicole Gibbs.

## 2015-07-20 NOTE — Telephone Encounter (Signed)
Noted. Please call Mickel Baas; future orders/messages can come to me.

## 2015-07-21 ENCOUNTER — Other Ambulatory Visit: Payer: Self-pay | Admitting: *Deleted

## 2015-07-21 ENCOUNTER — Encounter: Payer: Self-pay | Admitting: *Deleted

## 2015-07-21 DIAGNOSIS — Z8679 Personal history of other diseases of the circulatory system: Secondary | ICD-10-CM | POA: Diagnosis not present

## 2015-07-21 DIAGNOSIS — D509 Iron deficiency anemia, unspecified: Secondary | ICD-10-CM | POA: Diagnosis not present

## 2015-07-21 DIAGNOSIS — Z86718 Personal history of other venous thrombosis and embolism: Secondary | ICD-10-CM | POA: Diagnosis not present

## 2015-07-21 DIAGNOSIS — Z7901 Long term (current) use of anticoagulants: Secondary | ICD-10-CM | POA: Diagnosis not present

## 2015-07-21 DIAGNOSIS — Z86711 Personal history of pulmonary embolism: Secondary | ICD-10-CM | POA: Diagnosis not present

## 2015-07-21 LAB — CBC WITH DIFFERENTIAL/PLATELET
Basophils Absolute: 0.1 10*3/uL (ref 0.0–0.1)
Basophils Relative: 1 % (ref 0–1)
Eosinophils Absolute: 0.1 10*3/uL (ref 0.0–0.7)
Eosinophils Relative: 2 % (ref 0–5)
HCT: 38 % (ref 36.0–46.0)
Hemoglobin: 11.1 g/dL — ABNORMAL LOW (ref 12.0–15.0)
Lymphocytes Relative: 23 % (ref 12–46)
Lymphs Abs: 1.5 10*3/uL (ref 0.7–4.0)
MCH: 22.1 pg — ABNORMAL LOW (ref 26.0–34.0)
MCHC: 29.2 g/dL — ABNORMAL LOW (ref 30.0–36.0)
MCV: 75.5 fL — ABNORMAL LOW (ref 78.0–100.0)
Monocytes Absolute: 0.5 10*3/uL (ref 0.1–1.0)
Monocytes Relative: 8 % (ref 3–12)
Neutro Abs: 4.4 10*3/uL (ref 1.7–7.7)
Neutrophils Relative %: 66 % (ref 43–77)
Platelets: 444 10*3/uL — ABNORMAL HIGH (ref 150–400)
RBC: 5.03 MIL/uL (ref 3.87–5.11)
RDW: 29.1 % — ABNORMAL HIGH (ref 11.5–15.5)
WBC: 6.7 10*3/uL (ref 4.0–10.5)

## 2015-07-21 LAB — BRAIN NATRIURETIC PEPTIDE: Brain Natriuretic Peptide: 358.5 pg/mL — ABNORMAL HIGH (ref <100)

## 2015-07-21 NOTE — Patient Outreach (Addendum)
[facility]) Care Management  07/21/2015  Nicole Gibbs [birth year] [id number]   Assessment: Transition of care- week 2 Call placed to follow-up transition of care but unable to reach patient. HIPPA complaint voice message left with name and contact information.   Addendum: Inbound call received and spoke with patient. 70 year old female with recent admission to the hospital (2/17- 07/10/15) for asymptomatic anemia (requiring blood transfusion) and congestive heart failure.  Patient reports "feeling fine and no pain" at the moment. She shared that she was seen by Dr. Oletta Gibbs (gastroenterologist) today to follow- up on bleeding (anemia). Everything went well with the visit and was told to come back in 2 months for possible endoscopy.  Patient states she was seen by Dr. Jani Gibbs' s physician assistant last 2/28 with blood works done and will be checked by Dr. Jani Gibbs (primary care provider) this Thursday 07/23/15.  Patient denies coughing, chest tightness, shortness of breath, reportable weight gain and no mucus production as stated.  She reports checking her weight daily but not recording. Patient was encouraged to list down weight results daily.  Ms. Nicole Gibbs states that swelling has "gone down" with diuretics (Lasix) use.  She informs care management coordinator that she is also taking Potassium as well.  Transition of care call completed. Patient expressed understanding of diet but still needs more information on low salt diet and fluid restrictions since this is new to her as stated.  She has good understanding of her medications and hospital stay. Patient manages her own medications and has all medication supplies at present per patient's report.  She has a follow-up appointment with Dr. Juleen Gibbs (for Iron deficiency anemia) on 3/28 and with Dr. Gwenlyn Gibbs (cardiologist) on 4/5. Patient shares that her husband's caregiver provides transportation to her doctor's appointments  with an extra fee.  Patient reports that her and her husband have Multiple Sclerosis. Her husband is completely bed-ridden while she moves  slow. She reports that she receives home health care services from [facility] physical therapy and nurse. Patient denies urgent needs or concerns at this time. She agreed to home visit next week.  Encouraged patient to call [facility], care management coordinator or 24-hour nurse line as needed. Contact information provided to patient.    Plan: Initial home visit on 07/29/15. Follow-up referral to [facility] social worker for community resources and transportation needs.  Nicole Gibbs, BSN, RN-BC [place] Management Coordinator Cell: [phone]

## 2015-07-22 DIAGNOSIS — M170 Bilateral primary osteoarthritis of knee: Secondary | ICD-10-CM | POA: Diagnosis not present

## 2015-07-22 DIAGNOSIS — I82403 Acute embolism and thrombosis of unspecified deep veins of lower extremity, bilateral: Secondary | ICD-10-CM | POA: Diagnosis not present

## 2015-07-22 DIAGNOSIS — I110 Hypertensive heart disease with heart failure: Secondary | ICD-10-CM | POA: Diagnosis not present

## 2015-07-22 DIAGNOSIS — I5043 Acute on chronic combined systolic (congestive) and diastolic (congestive) heart failure: Secondary | ICD-10-CM | POA: Diagnosis not present

## 2015-07-22 DIAGNOSIS — M6281 Muscle weakness (generalized): Secondary | ICD-10-CM | POA: Diagnosis not present

## 2015-07-22 DIAGNOSIS — D649 Anemia, unspecified: Secondary | ICD-10-CM | POA: Diagnosis not present

## 2015-07-24 ENCOUNTER — Ambulatory Visit: Payer: Self-pay | Admitting: *Deleted

## 2015-07-27 ENCOUNTER — Other Ambulatory Visit: Payer: Self-pay | Admitting: *Deleted

## 2015-07-27 ENCOUNTER — Encounter (HOSPITAL_COMMUNITY): Payer: Medicare Other

## 2015-07-27 ENCOUNTER — Other Ambulatory Visit: Payer: Medicare Other

## 2015-07-27 DIAGNOSIS — M6281 Muscle weakness (generalized): Secondary | ICD-10-CM | POA: Diagnosis not present

## 2015-07-27 DIAGNOSIS — I82403 Acute embolism and thrombosis of unspecified deep veins of lower extremity, bilateral: Secondary | ICD-10-CM | POA: Diagnosis not present

## 2015-07-27 DIAGNOSIS — M170 Bilateral primary osteoarthritis of knee: Secondary | ICD-10-CM | POA: Diagnosis not present

## 2015-07-27 DIAGNOSIS — I5043 Acute on chronic combined systolic (congestive) and diastolic (congestive) heart failure: Secondary | ICD-10-CM | POA: Diagnosis not present

## 2015-07-27 DIAGNOSIS — I110 Hypertensive heart disease with heart failure: Secondary | ICD-10-CM | POA: Diagnosis not present

## 2015-07-27 DIAGNOSIS — D649 Anemia, unspecified: Secondary | ICD-10-CM | POA: Diagnosis not present

## 2015-07-27 NOTE — Patient Outreach (Signed)
[facility]) Care Management  07/27/2015  Emmie Dittmer 11/02/1945 [id number]  CSW received a new referral on patient from Dannielle Huh, [facility] with [facility], indicating that patient would benefit from social work services and resources to assist with obtaining transportation to and from physician appointments, as well as mobile meals.  Mrs. Ajel also requested that CSW assess patient and spouse for various other community agencies and resources, as patient's husband suffers from progressive Multiple Sclerosis and is completely bed-ridden.  Patient's husband, Janyce Casali is receiving home care services through [facility]. CSW made an initial attempt to try and contact patient today to perform phone assessment, as well as assess and assist with social needs and services, without success.  A HIPAA complaint message was left for patient on voicemail.  CSW is currently awaiting a return call.  Nat Christen, BSW, MSW, LCSW  Licensed Education officer, [facility]  Mailing [place] [address] [place], [place] [address] Physical Address-[address], [place], [place] [address] Toll Free Main # [phone] Fax # [phone] Cell # [phone]  Fax # [phone]  [email or web address]   Patient's preferred language:  [place]   English  ATTENTION:  If you speak [place], language assistance services, free of charge, are available to you.    Nondiscrimination and Accessibility Statement: Discrimination is Against the [employer], a subsidiary of [employer], complies with [employer] civil rights laws and does not discriminate on the basis of race, color, national origin, age, disability, or sex.  [facility] does not exclude people or treat them differently because of race, color, national origin, age, disability, or sex.  [place] Providers will:  . Provide free aids and services to people with disabilities to communicate effectively with [place], such as:     ? Qualified sign language interpreters  ? Written information in other formats (large print, audio, accessible electronic formats, other formats)   . Provide free language services to people whose primary language is not [place], such as:    ? Qualified interpreters    ? Information written in other languages   If you need these services, contact your [facility] psychologist.  If you believe that a [employer] has failed to provide these services or discriminated in another way on the basis of race, color, national origin, age, disability, or sex, you can file a Tourist information centre manager with: [place], [phone] or [email or web address].info/.  You can file a grievance in person or by mail, fax, or email. If you need help filing a grievance, you may contact Valrie Hart, Interim Compliance Officer, [employer] Department of Compliance and Integrity, [address], [place], [place]. [place], [phone], [email or web address].    You can also file a civil rights complaint with the U.S. Department of Health and Financial controller, Office for [employer], electronically through the Office for Civil Rights Complaint Portal, available at OnSiteLending.nl.jsf, or by mail or phone at:  [place]. Department of Health and Human Services [address] [place] Room [phone], [facility] [place], [place]  [phone], [phone] (TDD) Complaint forms are available at CutFunds.si.

## 2015-07-28 ENCOUNTER — Encounter: Payer: Self-pay | Admitting: *Deleted

## 2015-07-28 ENCOUNTER — Encounter (HOSPITAL_COMMUNITY): Payer: Self-pay

## 2015-07-28 ENCOUNTER — Emergency Department (HOSPITAL_COMMUNITY)
Admission: EM | Admit: 2015-07-28 | Discharge: 2015-07-28 | Disposition: A | Discharge status: Home / Self Care | Payer: Medicare Other | Attending: Emergency Medicine | Admitting: Emergency Medicine

## 2015-07-28 ENCOUNTER — Other Ambulatory Visit: Payer: Self-pay | Admitting: *Deleted

## 2015-07-28 DIAGNOSIS — I110 Hypertensive heart disease with heart failure: Secondary | ICD-10-CM | POA: Insufficient documentation

## 2015-07-28 DIAGNOSIS — I493 Ventricular premature depolarization: Secondary | ICD-10-CM | POA: Diagnosis not present

## 2015-07-28 DIAGNOSIS — M170 Bilateral primary osteoarthritis of knee: Secondary | ICD-10-CM | POA: Insufficient documentation

## 2015-07-28 DIAGNOSIS — Z79899 Other long term (current) drug therapy: Secondary | ICD-10-CM | POA: Insufficient documentation

## 2015-07-28 DIAGNOSIS — I5042 Chronic combined systolic (congestive) and diastolic (congestive) heart failure: Secondary | ICD-10-CM | POA: Diagnosis not present

## 2015-07-28 DIAGNOSIS — R008 Other abnormalities of heart beat: Secondary | ICD-10-CM | POA: Diagnosis present

## 2015-07-28 DIAGNOSIS — I5043 Acute on chronic combined systolic (congestive) and diastolic (congestive) heart failure: Secondary | ICD-10-CM | POA: Diagnosis not present

## 2015-07-28 DIAGNOSIS — Z8742 Personal history of other diseases of the female genital tract: Secondary | ICD-10-CM | POA: Diagnosis not present

## 2015-07-28 DIAGNOSIS — D649 Anemia, unspecified: Secondary | ICD-10-CM

## 2015-07-28 DIAGNOSIS — E669 Obesity, unspecified: Secondary | ICD-10-CM | POA: Insufficient documentation

## 2015-07-28 DIAGNOSIS — Z86711 Personal history of pulmonary embolism: Secondary | ICD-10-CM | POA: Diagnosis not present

## 2015-07-28 DIAGNOSIS — M6281 Muscle weakness (generalized): Secondary | ICD-10-CM | POA: Diagnosis not present

## 2015-07-28 DIAGNOSIS — I82403 Acute embolism and thrombosis of unspecified deep veins of lower extremity, bilateral: Secondary | ICD-10-CM | POA: Diagnosis not present

## 2015-07-28 DIAGNOSIS — Z86718 Personal history of other venous thrombosis and embolism: Secondary | ICD-10-CM | POA: Insufficient documentation

## 2015-07-28 LAB — BASIC METABOLIC PANEL
ANION GAP: 10 (ref 5–15)
BUN: 21 mg/dL — ABNORMAL HIGH (ref 6–20)
CALCIUM: 9.1 mg/dL (ref 8.9–10.3)
CO2: 29 mmol/L (ref 22–32)
Chloride: 102 mmol/L (ref 101–111)
Creatinine, Ser: 0.85 mg/dL (ref 0.44–1.00)
GFR calc Af Amer: >60 mL/min (ref >60)
GFR calc non Af Amer: >60 mL/min (ref >60)
GLUCOSE: 106 mg/dL — AB (ref 65–99)
Potassium: 3.8 mmol/L (ref 3.5–5.1)
Sodium: 141 mmol/L (ref 135–145)

## 2015-07-28 LAB — CBC WITH DIFFERENTIAL/PLATELET
BASOS PCT: 0 %
Basophils Absolute: 0 10*3/uL (ref 0.0–0.1)
EOS PCT: 2 %
Eosinophils Absolute: 0.1 10*3/uL (ref 0.0–0.7)
HEMATOCRIT: 41.5 % (ref 36.0–46.0)
Hemoglobin: 11.8 g/dL — ABNORMAL LOW (ref 12.0–15.0)
LYMPHS ABS: 1.3 10*3/uL (ref 0.7–4.0)
Lymphocytes Relative: 19 %
MCH: 23.5 pg — AB (ref 26.0–34.0)
MCHC: 28.4 g/dL — ABNORMAL LOW (ref 30.0–36.0)
MCV: 82.5 fL (ref 78.0–100.0)
MONOS PCT: 8 %
Monocytes Absolute: 0.5 10*3/uL (ref 0.1–1.0)
NEUTROS ABS: 4.8 10*3/uL (ref 1.7–7.7)
Neutrophils Relative %: 71 %
Platelets: 334 10*3/uL (ref 150–400)
RBC: 5.03 MIL/uL (ref 3.87–5.11)
WBC: 6.7 10*3/uL (ref 4.0–10.5)

## 2015-07-28 LAB — I-STAT TROPONIN, ED: Troponin i, poc: 0.01 ng/mL (ref 0.00–0.08)

## 2015-07-28 NOTE — ED Notes (Signed)
Pt here with irregular heart beat. Was told by her home health to come in here b/c her heart rate was 40.  Heart rate here has a variance also from her EKG rhythm to finger pulse ox.  Pt did not know she was having trouble. Denies chest pain. Does state some weakness possibly this morning.  No other symptom.

## 2015-07-28 NOTE — ED Provider Notes (Signed)
CSN: [id number]     Arrival date & time 07/28/15  1440 History   First MD Initiated Contact with Patient 07/28/15 1751     Chief Complaint  Patient presents with  . Irregular Heart Beat     (Consider location/radiation/quality/duration/timing/severity/associated sxs/prior Treatment) The history is provided by the patient and a relative.  Patient indicates was at home today, when home health person checked her bp/hr with a finger monitor and told her her heart rate was 40 and that she needed to come to ER.  Patient indicates felt fine, at baseline all day.  States recently in hospital with anemia, possibly due to slow chronic gi blood loss and received transfusion then.  Denies any recent acute blood loss, no melena. States has been eating and drinking. Compliant w normal meds. Denies faintness or dizziness. No cp or sob. States otherwise feels well for her.    Past Medical History  Diagnosis Date  . Thyroid nodule     no meds  . Hypertensive heart disease   . Pulmonary embolism, bilateral (HCC)     a. chronic coumadin.  . DVT, bilateral lower limbs ([place])   . Arthritis     knees  . Postmenopausal vaginal bleeding   . Rash     under breasts  . Nocturia   . Obese   . Chronic combined systolic and diastolic CHF (congestive heart failure) ([place])     a. 07/2012 Echo: EF 55-60%, Gr1 DD;  b. 06/2015 Echo: EF 35-40%, triv AI, Mod MR, mod dil LA, mildly reduced RV.  . H/O cardiovascular stress test     a. 07/2012 MV: fixed mid-dist anterior and apical defect - scar vs attenuation, distal ant HK, no reversibility, EF 51%-->low risk.   Past Surgical History  Procedure Laterality Date  . Cesarean section  1992  . Hysteroscopy w/d&c  03-24-2009  . Dilation and curettage of uterus  06/20/2012    Procedure: DILATATION AND CURETTAGE;  Surgeon: [name], MD;  Location: [facility];  Service: Gynecology;  Laterality: N/A;  . Dilation and evacution  1988    missed ab  . Cardiovascular stress  test  08-07-2012  DR HILTY/ DR BERRY    LOW RISK NUCLEAR STUDY/ NO ISCHEMIA/ FIXED MID TO DISTAL ANTERIOR AND APICAL DEFECT MAY REPRESENT SCAR VS BREAST ATTENUATION ARTIFACT/  MILD DISTAL ANTERIOR HYPOKINESIS/ EF 51%  . Transthoracic echocardiogram  08-08-2012    LVSF NORMAL/ EF [id number]  GRADE I DIASTOLIC DYSFUNCTION/ MILD AV AND MV REGURG./  RVSF MILDLY REDUCED  . Dilitation & currettage/hystroscopy with thermachoice ablation N/A 09/04/2012    Procedure: DILATATION & CURETTAGE WITH THERMACHOICE ABLATION;  Surgeon: [name], MD;  Location: [facility];  Service: Gynecology;  Laterality: N/A;  . Esophagogastroduodenoscopy N/A 05/07/2015    Procedure: ESOPHAGOGASTRODUODENOSCOPY (EGD);  Surgeon: Laurence Spates, MD;  Location: [facility];  Service: Endoscopy;  Laterality: N/A;  . Colonoscopy N/A 05/08/2015    Procedure: COLONOSCOPY;  Surgeon: Laurence Spates, MD;  Location: [facility];  Service: Endoscopy;  Laterality: N/A;   Family History  Problem Relation Age of Onset  . Coronary artery disease Father 76  . Stroke Father   . Sudden death Sister   . Hypertension Mother   . Mental illness Mother     anxiety   Social History  Substance Use Topics  . Smoking status: Never Smoker   . Smokeless tobacco: Never Used  . Alcohol Use: No   OB History  No data available     Review of Systems  Constitutional: Negative for fever and chills.  HENT: Negative for sore throat.   Eyes: Negative for redness.  Respiratory: Negative for shortness of breath.   Cardiovascular: Negative for chest pain.  Gastrointestinal: Negative for vomiting, abdominal pain and blood in stool.  Genitourinary: Negative for dysuria.  Musculoskeletal: Negative for back pain and neck pain.  Skin: Negative for rash.  Neurological: Negative for syncope.  Hematological: Does not bruise/bleed easily.  Psychiatric/Behavioral: Negative for confusion.      Allergies  Keflex and Sulfa  antibiotics  Home Medications   Prior to Admission medications   Medication Sig Start Date End Date Taking? Authorizing Provider  ferrous sulfate 325 (65 FE) MG tablet Take 1 tablet (325 mg total) by mouth 2 (two) times daily with a meal. 07/08/15  Yes Thurnell Lose, MD  furosemide (LASIX) 40 MG tablet Take 40 mg by mouth daily.  07/27/15  Yes Historical Provider, MD  lisinopril (PRINIVIL,ZESTRIL) 2.5 MG tablet Take 1 tablet (2.5 mg total) by mouth daily. 07/15/15  Yes Burtis Junes, NP  metoprolol (LOPRESSOR) 50 MG tablet Take 50 mg by mouth daily.    Yes Historical Provider, MD  pantoprazole (PROTONIX) 40 MG tablet Take 1 tablet (40 mg total) by mouth 2 (two) times daily. 07/08/15  Yes Thurnell Lose, MD  potassium chloride SA (K-DUR,KLOR-CON) 20 MEQ tablet Take 2 tablets (40 mEq total) by mouth daily. 07/10/15  Yes Thurnell Lose, MD  warfarin (COUMADIN) 3 MG tablet TAKE 1 TO 1 AND 1/2 TABLETS BY MOUTH DAILY OR AS DIRECTED Patient taking differently: takes 4.5mg  only on sun, takes 3mg  all other days 01/13/15  Yes [name], MD  acetaminophen (TYLENOL) 325 MG tablet Take 650 mg by mouth every 6 (six) hours as needed for moderate pain.    Historical Provider, MD  nitroGLYCERIN (NITROSTAT) 0.4 MG SL tablet Place 1 tablet (0.4 mg total) under the tongue every 5 (five) minutes as needed for chest pain. 06/20/14   [name], MD  nystatin (MYCOSTATIN/NYSTOP) 100000 UNIT/GM POWD Apply to your groin TID 07/09/15   Thurnell Lose, MD  zolpidem (AMBIEN) 5 MG tablet Take 1 tablet (5 mg total) by mouth at bedtime as needed for sleep. 07/01/15   Rogelia Mire, NP   BP 127/100 mmHg  Pulse 94  Temp(Src) 98.1 F (36.7 C) (Oral)  Resp 22  SpO2 100% Physical Exam  Constitutional: She is oriented to person, place, and time. She appears well-developed and well-nourished. No distress.  HENT:  Mouth/Throat: Oropharynx is clear and moist.  Eyes: Conjunctivae are normal. No scleral  icterus.  Neck: Neck supple. No tracheal deviation present.  Cardiovascular: Normal rate, normal heart sounds and intact distal pulses.   Pulmonary/Chest: Effort normal and breath sounds normal. No respiratory distress.  Abdominal: Soft. Normal appearance. She exhibits no distension. There is no tenderness.  Musculoskeletal: She exhibits no tenderness.  Neurological: She is alert and oriented to person, place, and time.  Skin: Skin is warm and dry. No rash noted. She is not diaphoretic.  Psychiatric: She has a normal mood and affect.  Nursing note and vitals reviewed.   ED Course  Procedures (including critical care time) Labs Review   Results for orders placed or performed during the hospital encounter of 07/28/15  CBC with Differential  Result Value Ref Range   WBC 6.7 4.0 - 10.5 K/uL   RBC 5.03 3.87 -  5.11 MIL/uL   Hemoglobin 11.8 (L) 12.0 - 15.0 g/dL   HCT 41.5 36.0 - 46.0 %   MCV 82.5 78.0 - 100.0 fL   MCH 23.5 (L) 26.0 - 34.0 pg   MCHC 28.4 (L) 30.0 - 36.0 g/dL   RDW NOT CALCULATED 11.5 - 15.5 %   Platelets 334 150 - 400 K/uL   Neutrophils Relative % 71 %   Lymphocytes Relative 19 %   Monocytes Relative 8 %   Eosinophils Relative 2 %   Basophils Relative 0 %   Neutro Abs 4.8 1.7 - 7.7 K/uL   Lymphs Abs 1.3 0.7 - 4.0 K/uL   Monocytes Absolute 0.5 0.1 - 1.0 K/uL   Eosinophils Absolute 0.1 0.0 - 0.7 K/uL   Basophils Absolute 0.0 0.0 - 0.1 K/uL   Smear Review MORPHOLOGY UNREMARKABLE   Basic metabolic panel  Result Value Ref Range   Sodium 141 135 - 145 mmol/L   Potassium 3.8 3.5 - 5.1 mmol/L   Chloride 102 101 - 111 mmol/L   CO2 29 22 - 32 mmol/L   Glucose, Bld 106 (H) 65 - 99 mg/dL   BUN 21 (H) 6 - 20 mg/dL   Creatinine, Ser 0.85 0.44 - 1.00 mg/dL   Calcium 9.1 8.9 - 10.3 mg/dL   GFR calc non Af Amer >60 >60 mL/min   GFR calc Af Amer >60 >60 mL/min   Anion gap 10 5 - 15  I-Stat Troponin, ED (not at [facility])  Result Value Ref Range   Troponin i, poc 0.01 0.00 -  0.08 ng/mL   Comment 3           Dg Chest 2 View  07/05/2015  CLINICAL DATA:  Newly diagnosed heart failure on chronic anticoagulation. Anemia. Exertional dyspnea 1 year. EXAM: CHEST  2 VIEW COMPARISON:  07/03/2015 and 05/09/2015 FINDINGS: Lungs are somewhat hypoinflated without lobar consolidation or effusion. Minimal linear atelectasis/ scarring over the left mid to lower lung. Minimal prominence of the perihilar markings suggesting mild vascular congestion. Mild stable cardiomegaly. Degenerative change of the spine. IMPRESSION: Mild stable cardiomegaly with minimal vascular congestion. Electronically Signed   By: Marin Olp M.D.   On: 07/05/2015 13:51   Dg Chest Portable 1 View  07/03/2015  CLINICAL DATA:  Anemia.  Shortness of breath, fatigue. EXAM: PORTABLE CHEST 1 VIEW COMPARISON:  05/09/2015 FINDINGS: There is cardiac enlargement. Pulmonary vascular congestion is noted. No pleural effusion or edema. No airspace consolidation identified. IMPRESSION: 1. Cardiac enlargement and pulmonary vascular congestion. Electronically Signed   By: Kerby Moors M.D.   On: 07/03/2015 17:15      I have personally reviewed and evaluated these images and lab results as part of my medical decision-making.   EKG Interpretation   Date/Time:  Tuesday July 28 2015 15:02:09 EDT Ventricular Rate:  96 PR Interval:  151 QRS Duration: 102 QT Interval:  389 QTC Calculation: 492 R Axis:   17 Text Interpretation:  Sinus rhythm Premature ventricular complexes  Probable LVH with secondary repol abnrm Non-specific ST-t changes No  significant change since last tracing Baseline wander in lead(s) V1  Confirmed by Ashok Cordia  MD, Lennette Bihari ([id number]) on 07/28/2015 6:31:05 PM      MDM   Iv ns. Continuous monitor.  Reviewed nursing notes and prior charts for additional history.   Labs sent.  Hgb c/w baseline.    ecg w pvcs, otherwise unchanged from prior.    Pt currently appears stable for d/c. Indicates has  close f/u already arranged with her doctor.    Lajean Saver, MD 07/28/15 [phone]

## 2015-07-28 NOTE — Discharge Instructions (Signed)
It was our pleasure to provide your ER care today - we hope that you feel better.  Follow up with your doctor in the coming week.  Return to ER if worse, new symptoms, weak/fainting, chest pain, trouble breathing, other concern.

## 2015-07-28 NOTE — Patient Outreach (Signed)
[facility]) Care Management  07/28/2015  Nicole Gibbs December 25, 1945 [id number]   CSW was able to make initial contact with patient today to perform phone assessment, as well as assess and assist with social work needs and services.  CSW introduced self, explained role and types of services provided through [employer] Care Management [facility] Care Management).  CSW further explained to patient that CSW works with patient's RNCM, also with [facility] Care Management, Nicole Gibbs. CSW then explained the reason for the call, indicating that Nicole Gibbs thought that patient would benefit from social work services and resources to assist patient with obtaining transportation, to and from physician appointments, as well as a referral for [employer], through [employer] [facility] [place].  CSW obtained two HIPAA compliant identifiers from patient, which included patient's name and date of birth. Patient admitted, "I have a lot going on right now, but I'm working through it".  CSW inquired as to how CSW could be of assistance.  Patient denied, indicating that she plans to make some calls later in the day.  CSW agreed to make a referral for patient to [employer], through [employer] [facility], after patient expressed an interest.  Patient's husband, Nicole Gibbs is already receiving five hot meals per week through [employer].  CSW also agreed to make a referral for patient to [employer], again through [employer] [facility] [place].  CSW spoke with patient about various other transportation resources, but patient was not interested.  Patient reported, "I will continue to use my husband's caregiver to get me to my appointments". Patient appeared to be quite anxious to get off the phone, indicating that she needed to speak with Nicole Gibbs, requesting her contact information.  CSW provided, inquiring as to how CSW could be of further assistance.  Patient denied, agreeing to  take down CSW's contact information and contact CSW in the near future if additional social work need arise.    CSW will perform a case closure on patient, as all goals of treatment have been met from social work standpoint and no additional social work needs have been identified at this time. CSW will notify patient's RNCM with [facility] Care Management, Nicole Gibbs of CSW's plans to close patient's case. CSW will fax a correspondence letter to patient's Primary Care Physician, Dr. Pearson Grippe to ensure that Dr. Selena Batten is aware of CSW's case closure plans.   CSW will submit a case closure request to Nena Polio, Care Management Assistant with [facility] Care Management, in the form of an In [employer].  CSW will ensure that Nicole Gibbs is aware of Nicole Gibbs, RNCM with [facility] Care Management, continued involvement with patient's care.  Danford Bad, BSW, MSW, LCSW  Licensed Restaurant manager, fast food [facility]  Mailing [address], [place], [place] [address] Physical Address-[address], [place], [place] [address] Toll Free Main # [phone] Fax # [phone] Cell # [phone]  Fax # [phone]  [email or web address]   Patient's preferred language:  [place]   English  ATTENTION:  If you speak English, language assistance services, free of charge, are available to you.    Nondiscrimination and Accessibility Statement: Discrimination is Against the [employer], a subsidiary of [employer], complies with [employer] civil rights laws and does not discriminate on the basis of race, color, national origin, age, disability, or sex.  [facility] Customer service manager  does not exclude people or treat them differently because of race, color, national origin, age, disability, or sex.  [facility] Tourist information centre manager Providers will:  . Provide free aids and  services to people with disabilities to communicate effectively with [place], such as:     ? Qualified sign language interpreters  ? Written information in other formats (large print, audio, accessible electronic formats, other formats)   . Provide free language services to people whose primary language is not [place], such as:    ? Qualified interpreters    ? Information written in other languages   If you need these services, contact your [facility] Printmaker.  If you believe that a [facility] [employer] has failed to provide these services or discriminated in another way on the basis of race, color, national origin, age, disability, or sex, you can file a Theatre manager with: [facility]'s Anonymous Reporting Helpline, [phone] or [email or web address].  You can file a grievance in person or by mail, fax, or email. If you need help filing a grievance, you may contact Hendricks Milo, Interim Compliance Officer, [facility] Department of Compliance and Integrity, [address] [place], [place], [place]. [address], [phone], [email or web address].    You can also file a civil rights complaint with the U.S. Department of Health and Health and safety inspector, Office for [employer], electronically through the Office for Civil Rights Complaint Portal, available at https://mendez-ellison.com/.jsf, or by mail or phone at:  U.S. Department of Health and Human Services [address], [place] Room [phone], [facility] Building [place], [place]. [address]  [phone], [phone] (TDD) Complaint forms are available at [email or web address].

## 2015-07-28 NOTE — ED Notes (Signed)
PT DISCHARGED. INSTRUCTIONS GIVEN. AAOX3. PT IN NO APPARENT DISTRESS OR PAIN. THE OPPORTUNITY TO ASK QUESTIONS WAS PROVIDED. 

## 2015-07-29 ENCOUNTER — Telehealth (HOSPITAL_COMMUNITY): Payer: Self-pay | Admitting: *Deleted

## 2015-07-29 ENCOUNTER — Other Ambulatory Visit: Payer: Self-pay | Admitting: *Deleted

## 2015-07-29 DIAGNOSIS — Z0000 Encounter for general adult medical examination without abnormal findings: Secondary | ICD-10-CM | POA: Diagnosis not present

## 2015-07-29 DIAGNOSIS — Z23 Encounter for immunization: Secondary | ICD-10-CM | POA: Diagnosis not present

## 2015-07-29 DIAGNOSIS — E0590 Thyrotoxicosis, unspecified without thyrotoxic crisis or storm: Secondary | ICD-10-CM | POA: Diagnosis not present

## 2015-07-29 DIAGNOSIS — D649 Anemia, unspecified: Secondary | ICD-10-CM | POA: Diagnosis not present

## 2015-07-29 DIAGNOSIS — I82409 Acute embolism and thrombosis of unspecified deep veins of unspecified lower extremity: Secondary | ICD-10-CM | POA: Diagnosis not present

## 2015-07-29 DIAGNOSIS — E049 Nontoxic goiter, unspecified: Secondary | ICD-10-CM | POA: Diagnosis not present

## 2015-07-29 NOTE — Patient Outreach (Signed)
[facility]) Care Management   07/29/2015  Nicole Gibbs [birth year] [id number]  Nicole Gibbs is an 70 y.o. female  Subjective: Patient states "waking up sort of weak this morning, but feeling better now". Patient denies pain at this time. Reports being in the emergency room yesterday  due to heart rate running on 34- 48 as checked by [facility] nurse. She states laboratory works and EKG were done but nothing significant was found so she was sent home, per patient.  Objective:  BP 128/60 mmHg  Pulse 74  Resp 18  SpO2 97%  Review of Systems  Constitutional: Positive for weight loss.       Verbalized still "feeling weak"- still regaining strength  HENT: Positive for congestion.        Nasal congestion  Eyes: Negative.   Respiratory: Negative.  Negative for cough, sputum production and wheezing.        Respirations even and unlabored Scattered few rhonchi otherwise clear  Cardiovascular: Positive for leg swelling.       Irregular heart beats Bilateral legs and ankle swelling   Gastrointestinal: Negative.        Obese Abdomen soft and non tender Bowel sounds present  Genitourinary: Negative.   Musculoskeletal: Positive for falls.       History of fall with left wrist fracture post surgery  Skin: Positive for rash.       Slight redness under abdominal folds and breast folds- use of Nystatin powder Bilateral leg edema with slight redness, dry and flaky- encourage application of moisturizer/ lotion  Neurological: Positive for weakness.  Endo/Heme/Allergies: Negative.   Psychiatric/Behavioral: Negative.     Physical Exam  Current Medications:   Current Outpatient Prescriptions  Medication Sig Dispense Refill  . acetaminophen (TYLENOL) 325 MG tablet Take 650 mg by mouth every 6 (six) hours as needed for moderate pain.    . ferrous sulfate 325 (65 FE) MG tablet Take 1 tablet (325 mg total) by mouth 2 (two) times daily with a meal. 60 tablet 0  . furosemide (LASIX) 40  MG tablet Take 40 mg by mouth daily.     Marland Kitchen lisinopril (PRINIVIL,ZESTRIL) 2.5 MG tablet Take 1 tablet (2.5 mg total) by mouth daily. 30 tablet 6  . metoprolol (LOPRESSOR) 50 MG tablet Take 50 mg by mouth daily.     . nitroGLYCERIN (NITROSTAT) 0.4 MG SL tablet Place 1 tablet (0.4 mg total) under the tongue every 5 (five) minutes as needed for chest pain. 25 tablet 3  . nystatin (MYCOSTATIN/NYSTOP) 100000 UNIT/GM POWD Apply to your groin TID 15 g 0  . pantoprazole (PROTONIX) 40 MG tablet Take 1 tablet (40 mg total) by mouth 2 (two) times daily. 60 tablet 0  . potassium chloride SA (K-DUR,KLOR-CON) 20 MEQ tablet Take 2 tablets (40 mEq total) by mouth daily. 30 tablet 0  . warfarin (COUMADIN) 3 MG tablet TAKE 1 TO 1 AND 1/2 TABLETS BY MOUTH DAILY OR AS DIRECTED (Patient taking differently: takes 4.5mg  only on sun, takes 3mg  all other days) 120 tablet 1  . zolpidem (AMBIEN) 5 MG tablet Take 1 tablet (5 mg total) by mouth at bedtime as needed for sleep. (Patient not taking: Reported on 07/29/2015) 30 tablet 0   No current facility-administered medications for this visit.    Functional Status:   In your present state of health, do you have any difficulty performing the following activities: 07/29/2015 07/28/2015  Hearing? - N  Vision? N Y  Difficulty concentrating or making  decisions? - N  Walking or climbing stairs? (No Data) Y  Dressing or bathing? - N  Doing errands, shopping? - Y  Preparing Food and eating ? Y N  Using the Toilet? - N  In the past six months, have you accidently leaked urine? - N  Do you have problems with loss of bowel control? - N  Managing your Medications? - N  Managing your Finances? - Y  Housekeeping or managing your Housekeeping? - Y    Fall/Depression Screening:    PHQ 2/9 Scores 07/28/2015 07/21/2015 05/06/2015  PHQ - 2 Score 0 0 0    Assessment:   Arrived at patient's apartment. Patient and husband are in, however, husband is completely bed-ridden in a hospital  bed.  Patient reports she was seen in the emergency room yesterday due to low heart rate. She was sent home after laboratory works and EKG showed no significant results as stated. Patient reports "feeling good at this time".   According to patient, she was seen by Dr. Randa Evens (gastroenterologist) on 3/7 for follow- up on bleeding (anemia). She reports everything went well and was told to come back for follow-up in 2 months. Patient reports seeing physician assistant of primary provider on 2/28. Appointment to see Dr. Pearson Grippe (primary care provider) on 07/23/15 was cancelled and rescheduled today- 3/15 at 11:45 am.  Patient noted with nasal congestion and mentioned had been on Zyrtec. Patient denies coughing, mucus production, chest tightness, shortness of breath and no reportable weight gain as stated.  Patient has no reported signs and symptoms of active bleeding as well. Encouraged her to notify provider if noted any.  She monitors her weight daily and is now recording the results. Weight today is 194 pounds. [facility] calendar/ notebook and [facility] packet for heart failure were provided and discussed with patient..  Patient informed care management coordinator that swelling has "gone down" to bilateral legs as compared to previous. She reports loosing weight from Lasix use in the hospital. Encourage patient to continue elevating lower extremities and use of compression socks to help reduce swelling.  Patient shared that stove is broken and unable to cook food at home. Patient reports buying food from outside. She mentions reading food labels and choosing food closely inorder to get as close to diet restrictions as can be. Patient was provided more information on low salt diet and gave her food list (with picture) of low salt food as compared to high salt food as her guide. Patient was referred to [facility] social worker to assist her with community resources (meals and transportation) available for her.  Patient  manages her own medications using pill box system. She mentioned that she will discuss Iron medication with provider on today's visit and check if she can get it over the counter since it costs her $32 to get Iron prescription at [facility]. She also mentioned that she will request provider to get a refill prescription of her Ambien to use as needed for sleep. Patient was made aware of [facility] pharmacy benefits if needed. Encouraged patient to notify care management coordinator if needing further assistance with medications to get referred to [facility] pharmacy.   She has upcoming follow-up appointments with Dr. Alan Ripper (for Iron deficiency anemia) on 3/28 and with Dr. Allyson Sabal (cardiologist) on 4/5. Patient will have her Stress test on 3/20 as reported. Her husband's caregiver ([employer])  provides transportation to her doctor's appointments with extra fee. [facility] home health care services of physical therapy and  nurse are currently in place. Patient was provided a Home fall prevention checklist due to history of falling.  Patient denies any other needs or issues at this time. Patient agreed to transition of care call next week.  She was encouraged to call [facility], care management coordinator or 24-hour nurse line if necessary. Contact information was provided to patient.    Plan: Transition of care call on 08/07/15    [facility] CM Care Plan Problem One        Most Recent Value   Care Plan Problem One  at risk for readmission   Role Documenting the Problem One  Care Management Coordinator   Care Plan for Problem One  Active   [facility] Long Term Goal (31-90 days)  patient will not be redmitted in the next 60 days    [facility]N Long Term Goal Start Date  07/29/15   Interventions for Problem One Long Term Goal  encourage adherence to low salt diet and 1.5 liters per day fluid restriction encourage patient to monitor signs and symptoms of bleeding (feeling dizzy/ lightheaded,shortness of breath,extreme weakness,bloody/ black,  tarry stools,bleeding from any body parts) and report if any,  encourage medication adherence (especially iron),  encourage attendance to follow-up appointments with providers,  monitor for signs and symptoms of heart failure flare-up and call the doctor for help,  continue to weigh self daily and record   [facility] CM Short Term Goal #1 (0-30 days)  Patient will report no signs and symptoms of bleeding in the next 30 days   [facility] CM Short Term Goal #1 Start Date  07/29/15   Interventions for Short Term Goal #1  encourage patient to monitor self for bleeding from any body parts and report if any,  educate patient to watch for extreme weakness, chest pain,shortness of breath,dizziness,bloody or black,tarry stools and seek for medical assistance if develops any    [facility] CM Short Term Goal #2 (0-30 days)  Patient will report attending follow-up appointments with providers (primary care provider, gastro, cardio) in the next 30 days    [facility] CM Short Term Goal #2 Start Date  07/29/15   Interventions for Short Term Goal #2  review upcoming provider appointments with patient and list it down on calendar to be reminded,  explain the benefits/ importance of attending follow-up visits with provider    [facility] CM Short Term Goal #3 (0-30 days)  Patient will report active participation with home health services to regain strength in the next 30 days   [facility] CM Short Term Goal #3 Start Date  07/29/15   Interventions for Short Tern Goal #3  confirm start date of home health services ([facility]),  encourage to participate with services rendered and perform treatment/ exercises to regain strength/ endurance,  encourage patient to reflect schedule of home health staff on her calendar to be aware and be ready to participate when they come and work with her    [facility] CM Care Plan Problem Two        Most Recent Value   Care Plan Problem Two  Knowledge deficit regarding heart failure management   Role Documenting the Problem Two  Care  Management Coordinator   Care Plan for Problem Two  Active   Interventions for Problem Two Long Term Goal   provide [facility] heart failure packet of information and explain to patient,  encourage patient to read the booklet regarding the 5 simple steps of living life better with heart failure,  provide education on low  salt diet,  provide food list of low salt/ high salt foods,  emphasize 1.5 liter fluid restriction as ordered,  encourage to continue monitoring weight daily and record, evaluate self daily using heart failure zone tool, medication adherence,performing exercises with therapist as tolerated   [facility]N Long Term Goal (31-90) days  Patient will verbalize the 5 simple steps of managing heart failure within the next 60 days   [facility]N Long Term Goal Start Date  07/29/15   [facility]N CM Short Term Goal #1 (0-30 days)  Patient will identify the heart failure zones in the next 30 days   [facility]N CM Short Term Goal #1 Start Date  07/29/15   Interventions for Short Term Goal #2   [facility]N heart failure packet provided and discussed the different heart failure zones,  encourage patient to evaluate self daily using heart failure zone tool   [facility]N CM Short Term Goal #2 (0-30 days)  Patient will verbalize at least 3 signs and symptoms under each heart failure zone and when to call the doctor for help in the next 30 days    [facility]N CM Short Term Goal #2 Start Date  07/29/15   Interventions for Short Term Goal #2  explain the [facility] packet of information provided,  explain the different signs and symptoms under each heart failure zones,  emphasize the signs and symptoms of heart failure flare-up under yellow zone and call the doctor for help if develop any,  encourage patient to monitor increased swelling to legs/ankles,  advise patient to elevate lower extremities at intervals and when in bed as well as use of compression socks to help decrease swelling    [facility]N CM Short Term Goal #3 (0-30 days)  Patient will continue to weigh daily and record in  the next 30 days   [facility]N CM Short Term Goal #3 Start Date  07/29/15   Interventions for Short Term Goal #3  encourage patient to monitor self daily and record,  encourage to check weight in the morning after using the bathroom,before getting dressed and before eating breakfast,  explain to monitor 3 pounds weight gain overnight or 5 pounds in a week and report       Ramey Ketcherside A. Senie Lanese, BSN, RN-BC [facility] Care Management Coordinator Cell: [phone]

## 2015-07-29 NOTE — Telephone Encounter (Signed)
Left message on voicemail in reference to upcoming appointment scheduled for 08/03/15. Phone number given for a call back so details instructions can be given. Hubbard Robinson, RN

## 2015-07-30 ENCOUNTER — Encounter: Payer: Self-pay | Admitting: *Deleted

## 2015-07-30 DIAGNOSIS — M170 Bilateral primary osteoarthritis of knee: Secondary | ICD-10-CM | POA: Diagnosis not present

## 2015-07-30 DIAGNOSIS — M6281 Muscle weakness (generalized): Secondary | ICD-10-CM | POA: Diagnosis not present

## 2015-07-30 DIAGNOSIS — I110 Hypertensive heart disease with heart failure: Secondary | ICD-10-CM | POA: Diagnosis not present

## 2015-07-30 DIAGNOSIS — I5043 Acute on chronic combined systolic (congestive) and diastolic (congestive) heart failure: Secondary | ICD-10-CM | POA: Diagnosis not present

## 2015-07-30 DIAGNOSIS — I82403 Acute embolism and thrombosis of unspecified deep veins of lower extremity, bilateral: Secondary | ICD-10-CM | POA: Diagnosis not present

## 2015-07-30 DIAGNOSIS — D649 Anemia, unspecified: Secondary | ICD-10-CM | POA: Diagnosis not present

## 2015-08-03 ENCOUNTER — Ambulatory Visit (INDEPENDENT_AMBULATORY_CARE_PROVIDER_SITE_OTHER): Payer: Medicare Other | Admitting: *Deleted

## 2015-08-03 ENCOUNTER — Other Ambulatory Visit: Payer: Medicare Other

## 2015-08-03 ENCOUNTER — Ambulatory Visit (HOSPITAL_COMMUNITY): Payer: Medicare Other | Attending: Cardiology

## 2015-08-03 DIAGNOSIS — Z7901 Long term (current) use of anticoagulants: Secondary | ICD-10-CM

## 2015-08-03 DIAGNOSIS — I2699 Other pulmonary embolism without acute cor pulmonale: Secondary | ICD-10-CM | POA: Diagnosis not present

## 2015-08-03 DIAGNOSIS — I2601 Septic pulmonary embolism with acute cor pulmonale: Secondary | ICD-10-CM

## 2015-08-03 DIAGNOSIS — R9439 Abnormal result of other cardiovascular function study: Secondary | ICD-10-CM | POA: Diagnosis not present

## 2015-08-03 DIAGNOSIS — I5041 Acute combined systolic (congestive) and diastolic (congestive) heart failure: Secondary | ICD-10-CM | POA: Insufficient documentation

## 2015-08-03 DIAGNOSIS — R0609 Other forms of dyspnea: Secondary | ICD-10-CM | POA: Insufficient documentation

## 2015-08-03 DIAGNOSIS — I2782 Chronic pulmonary embolism: Secondary | ICD-10-CM

## 2015-08-03 DIAGNOSIS — I82403 Acute embolism and thrombosis of unspecified deep veins of lower extremity, bilateral: Secondary | ICD-10-CM

## 2015-08-03 DIAGNOSIS — I110 Hypertensive heart disease with heart failure: Secondary | ICD-10-CM | POA: Insufficient documentation

## 2015-08-03 LAB — MYOCARDIAL PERFUSION IMAGING
Peak HR: 122 {beats}/min
RATE: 0.25
Rest HR: 80 {beats}/min
SDS: 4
SRS: 14
SSS: 16
TID: 0.96

## 2015-08-03 LAB — POCT INR: INR: 3.1

## 2015-08-03 MED ORDER — TECHNETIUM TC 99M SESTAMIBI GENERIC - CARDIOLITE
32.1000 | Freq: Once | INTRAVENOUS | Status: AC | PRN
  Administered 2015-08-03: 32.1 via INTRAVENOUS

## 2015-08-03 MED ORDER — TECHNETIUM TC 99M SESTAMIBI GENERIC - CARDIOLITE
10.1000 | Freq: Once | INTRAVENOUS | Status: AC | PRN
  Administered 2015-08-03: 10.1 via INTRAVENOUS

## 2015-08-03 MED ORDER — REGADENOSON 0.4 MG/5ML IV SOLN
0.4000 mg | Freq: Once | INTRAVENOUS | Status: AC
  Administered 2015-08-03: 0.4 mg via INTRAVENOUS

## 2015-08-04 ENCOUNTER — Encounter: Payer: Self-pay | Admitting: Nurse Practitioner

## 2015-08-04 DIAGNOSIS — M6281 Muscle weakness (generalized): Secondary | ICD-10-CM | POA: Diagnosis not present

## 2015-08-04 DIAGNOSIS — M170 Bilateral primary osteoarthritis of knee: Secondary | ICD-10-CM | POA: Diagnosis not present

## 2015-08-04 DIAGNOSIS — I110 Hypertensive heart disease with heart failure: Secondary | ICD-10-CM | POA: Diagnosis not present

## 2015-08-04 DIAGNOSIS — I5043 Acute on chronic combined systolic (congestive) and diastolic (congestive) heart failure: Secondary | ICD-10-CM | POA: Diagnosis not present

## 2015-08-04 DIAGNOSIS — D649 Anemia, unspecified: Secondary | ICD-10-CM | POA: Diagnosis not present

## 2015-08-04 DIAGNOSIS — I82403 Acute embolism and thrombosis of unspecified deep veins of lower extremity, bilateral: Secondary | ICD-10-CM | POA: Diagnosis not present

## 2015-08-07 ENCOUNTER — Ambulatory Visit: Payer: Medicare Other | Admitting: Pharmacist Clinician (PhC)/ Clinical Pharmacy Specialist

## 2015-08-07 ENCOUNTER — Other Ambulatory Visit: Payer: Self-pay | Admitting: *Deleted

## 2015-08-07 DIAGNOSIS — D649 Anemia, unspecified: Secondary | ICD-10-CM | POA: Diagnosis not present

## 2015-08-07 DIAGNOSIS — M6281 Muscle weakness (generalized): Secondary | ICD-10-CM | POA: Diagnosis not present

## 2015-08-07 DIAGNOSIS — M170 Bilateral primary osteoarthritis of knee: Secondary | ICD-10-CM | POA: Diagnosis not present

## 2015-08-07 DIAGNOSIS — I110 Hypertensive heart disease with heart failure: Secondary | ICD-10-CM | POA: Diagnosis not present

## 2015-08-07 DIAGNOSIS — I5043 Acute on chronic combined systolic (congestive) and diastolic (congestive) heart failure: Secondary | ICD-10-CM | POA: Diagnosis not present

## 2015-08-07 DIAGNOSIS — I82403 Acute embolism and thrombosis of unspecified deep veins of lower extremity, bilateral: Secondary | ICD-10-CM | POA: Diagnosis not present

## 2015-08-07 NOTE — Patient Outreach (Signed)
[place] [facility]) Care Management  08/07/2015  Nicole Gibbs 07-31-1945 [id number]   Assessment: Transition of care - week 4 Call placed and spoke with patient. She verbalized "doing well" at this time.  Patient mentioned that her weight today is 192 pounds with no reportable weight gain. She reports weighing self daily and records with her weight ranging from 192-194 pounds.  Patient updates care management coordinator of visit with Dr. Maudie Mercury (primary care provider). According to patient, Dr. Maudie Mercury had approved to purchase Iron over the counter and he had provided a prescription for Ambien used by patient as needed for sleep. Patient had her pneumonia shot and Tetanus shot on this visit as reported. No changes in medication made. She states that next appointment on schedule is to see hematologist (Dr. Irene Limbo) on 3/28 and husband's caregiver will provide transportation for her. Patient also has a follow-up appointment with Dr. [name] (cardiologist) on 4/5.  Patient reports having done Stress test early this week which showed she had a heart attack unnoticed in the past 3 years. Patient was advised to continue with her medications. She states awareness to work on her diet restrictions as well. Reinforced and discuss with patient the signs and symptoms of heart attack.  Patient continues to work with home health physical therapy and nurse ([facility]).   Patient mentioned continuing to buy food out since she has no plans of replacing broken stove (states "going through a lot of issues caring for sick husband and herself). Emphasized to patient about making wise food choices when buying food items, reading food labels and getting as close to her diet as possible. She mentions of picking vegetables (celery, carrots, tomatoes, broccoli, tomatoes, peas, cauliflower), bowls of fruit and broiled/ baked fish.  She mentioned interest of having "meals on wheels" like what her husband has. Encouraged to get in  touch with [facility] social worker to assist her with meal resources and she agreed. Patient was also provided nutritionist [facility]) contact information to get assistance with appropriate food choices in relation to health issues.  Ms. Vath reports maintaining on the "green zone" without shortness of breath,cough,mucus,chest tightness or reportable weight gain. Swelling remains to bilateral legs/ankles and states elevating it especially at nights when in bed.   Patient was very grateful of assistance provided with her health. She is unable to identify any other needs at the moment. She agreed to transition of care call next week. Encouraged patient to call [facility] management coordinator or 24-hour nurse line if needed.   Plan: Transition of care call on 08/11/15. Will follow-up on [facility] social worker assistance with meals. Will follow-up Nutritionist assistance with proper food choices.   Damyia Strider A. Abundio Teuscher, BSN, RN-BC [place] Management Coordinator Cell: [phone]

## 2015-08-11 ENCOUNTER — Encounter: Payer: Self-pay | Admitting: Hematology

## 2015-08-11 ENCOUNTER — Other Ambulatory Visit: Payer: Self-pay | Admitting: *Deleted

## 2015-08-11 ENCOUNTER — Telehealth: Payer: Self-pay | Admitting: Hematology

## 2015-08-11 ENCOUNTER — Ambulatory Visit (HOSPITAL_BASED_OUTPATIENT_CLINIC_OR_DEPARTMENT_OTHER): Payer: Medicare Other

## 2015-08-11 ENCOUNTER — Ambulatory Visit (HOSPITAL_BASED_OUTPATIENT_CLINIC_OR_DEPARTMENT_OTHER): Payer: Medicare Other | Admitting: Hematology

## 2015-08-11 VITALS — BP 124/84 | HR 64 | Temp 97.6°F | Resp 20 | Ht 62.0 in | Wt 195.8 lb

## 2015-08-11 DIAGNOSIS — D500 Iron deficiency anemia secondary to blood loss (chronic): Secondary | ICD-10-CM | POA: Diagnosis not present

## 2015-08-11 DIAGNOSIS — Z7901 Long term (current) use of anticoagulants: Secondary | ICD-10-CM | POA: Diagnosis not present

## 2015-08-11 DIAGNOSIS — Z86718 Personal history of other venous thrombosis and embolism: Secondary | ICD-10-CM | POA: Diagnosis not present

## 2015-08-11 DIAGNOSIS — I5042 Chronic combined systolic (congestive) and diastolic (congestive) heart failure: Secondary | ICD-10-CM

## 2015-08-11 DIAGNOSIS — K922 Gastrointestinal hemorrhage, unspecified: Secondary | ICD-10-CM | POA: Diagnosis not present

## 2015-08-11 DIAGNOSIS — D75839 Thrombocytosis, unspecified: Secondary | ICD-10-CM

## 2015-08-11 DIAGNOSIS — I10 Essential (primary) hypertension: Secondary | ICD-10-CM

## 2015-08-11 DIAGNOSIS — D473 Essential (hemorrhagic) thrombocythemia: Secondary | ICD-10-CM

## 2015-08-11 DIAGNOSIS — D649 Anemia, unspecified: Secondary | ICD-10-CM

## 2015-08-11 LAB — COMPREHENSIVE METABOLIC PANEL
ALT: 20 U/L (ref 0–55)
ANION GAP: 7 meq/L (ref 3–11)
AST: 25 U/L (ref 5–34)
Albumin: 3.6 g/dL (ref 3.5–5.0)
Alkaline Phosphatase: 103 U/L (ref 40–150)
BUN: 15.4 mg/dL (ref 7.0–26.0)
CHLORIDE: 107 meq/L (ref 98–109)
CO2: 29 meq/L (ref 22–29)
Calcium: 9.4 mg/dL (ref 8.4–10.4)
Creatinine: 0.8 mg/dL (ref 0.6–1.1)
EGFR: 72 mL/min/{1.73_m2} — AB (ref >90)
Glucose: 86 mg/dl (ref 70–140)
POTASSIUM: 4 meq/L (ref 3.5–5.1)
Sodium: 143 mEq/L (ref 136–145)
Total Bilirubin: 0.65 mg/dL (ref 0.20–1.20)
Total Protein: 7.1 g/dL (ref 6.4–8.3)

## 2015-08-11 LAB — CBC & DIFF AND RETIC
BASO%: 1 % (ref 0.0–2.0)
Basophils Absolute: 0.1 10*3/uL (ref 0.0–0.1)
EOS%: 2.8 % (ref 0.0–7.0)
Eosinophils Absolute: 0.2 10*3/uL (ref 0.0–0.5)
HCT: 40.3 % (ref 34.8–46.6)
HGB: 12.4 g/dL (ref 11.6–15.9)
IMMATURE RETIC FRACT: 2.9 % (ref 1.60–10.00)
LYMPH%: 22.9 % (ref 14.0–49.7)
MCH: 24.9 pg — ABNORMAL LOW (ref 25.1–34.0)
MCHC: 30.9 g/dL — ABNORMAL LOW (ref 31.5–36.0)
MCV: 80.8 fL (ref 79.5–101.0)
MONO#: 0.5 10*3/uL (ref 0.1–0.9)
MONO%: 8.1 % (ref 0.0–14.0)
NEUT#: 4.3 10*3/uL (ref 1.5–6.5)
NEUT%: 65.2 % (ref 38.4–76.8)
PLATELETS: 270 10*3/uL (ref 145–400)
RBC: 4.99 10*6/uL (ref 3.70–5.45)
RDW: 29.8 % — ABNORMAL HIGH (ref 11.2–14.5)
Retic %: 0.73 % (ref 0.70–2.10)
Retic Ct Abs: 36.43 10*3/uL (ref 33.70–90.70)
WBC: 6.5 10*3/uL (ref 3.9–10.3)
lymph#: 1.5 10*3/uL (ref 0.9–3.3)

## 2015-08-11 LAB — FERRITIN: Ferritin: 229 ng/ml (ref 9–269)

## 2015-08-11 LAB — CHCC SMEAR

## 2015-08-11 MED ORDER — B COMPLEX VITAMINS PO CAPS: 1.0000 | ORAL_CAPSULE | Freq: Every day | ORAL | Status: DC

## 2015-08-11 NOTE — Telephone Encounter (Signed)
Gave and printed appt shced and avs for pt for May °

## 2015-08-11 NOTE — Patient Outreach (Signed)
[facility]) Care Management  08/11/2015  Nicole Gibbs 08/01/1945 [id number]    Assessment: Transition of care- follow-up Call placed and spoke with patient. She states "doing pretty good", "up and about" and reports that she drove self to her doctor's appointment today.  She was able to see hematologist (Dr. Irene Limbo) and blood works were done. No obvious source of bleeding as reported. Patient shares that Dr. Irene Limbo instructed her to take Vitamin B complex and Multivitamins as stated.   Patient mentioned that her weight today is 195 pounds and yesterday was 194 pounds with no reportable weight gain so far. She states continuing to weigh self daily and records. Her heart rate is 64 and blood pressure was 124/84 with oxygen level at 98%.  Patient has a follow-up appointment with Dr. [name] (cardiologist) on 4/5 and able to transport self to provider's appointments as mentioned.  [facility]  home health physical therapy and nurse continues to work with patient twice a week.  Patient continues to buy food out and reports being careful with food choices and reading labels before purchasing- as reported. Patient shared buying steamed vegetables that she can cook in microwave. She is aware to pick vegetables and fruits as healthy food choices as stated. Patient reports not being able to arrange for "meals on wheels" like what her husband has since she is still busy keeping up with her tasks and doctors' appointments. She is aware to call for help if needs assistance with meal resources. She states being able to manage it so far.  Patient had been provided with nutritionist [facility]) contact information to get assistance with appropriate food choices in relation to health issues.  Nicole Gibbs identifies herself on the "green zone" with no shortness of breath,cough,mucus,chest tightness or reportable weight gain. She mentions that swelling remains to both legs/ankles and continues to elevate  legs when she gets the chance most especially at nights when in bed.   Patient does not identify any additional needs at this time. She agreed to home visit next month. Encouraged patient to call [facility] management coordinator or 24-hour nurse line as necessary. Contact information provided to patient.   Plan: Routine home visit on 09/04/15. Will follow-up nutritionist assistance with proper food choices.  Will follow-up with meal plan as needed.  Alden Feagan A. Selen Smucker, BSN, RN-BC [place] Management Coordinator Cell: [phone]

## 2015-08-11 NOTE — Progress Notes (Signed)
Marland Kitchen    HEMATOLOGY/ONCOLOGY CONSULTATION NOTE  Date of Service: 08/11/2015  Patient Care Team: [name], MD as PCP - General (Internal Medicine) Diamantina Monks, RN as [facility]  CHIEF COMPLAINTS/PURPOSE OF CONSULTATION:  Evaluation of Anemia  HISTORY OF PRESENTING ILLNESS:   Nicole Gibbs is a wonderful 70 y.o. female who has been referred to [place] by Dr .[name], MD for evaluation and management of Anemia.  Patient has a history of hypertension, obesity, coronary disease with evidence of silent MI and myocardial scar on stress testing in 2014 and recently on [id number], chronic systolic and diastolic CHF and previous history of bilateral DVT and pulmonary embolism on chronic Coumadin therapy. Patient notes that she had issues with postmenopausal bleeding 3 years ago which was resolved with endometrial ablation.  Patient was admitted to the [facility] on 05/06/2015 after presenting to urgent care with shortness of breath with subsequent labs showing a hemoglobin of 6. She was noted to have melanotic stools. No abdominal pain or chest pain. INR was noted to be therapeutic. She was admitted to hospital for acute GI bleed and had an EGD and colonoscopy that did not show any ulcer source of bleeding. Her anticoagulation was resumed. She was placed on Protonix 40 mg twice a day. Patient received 4 units of PRBCs for her GI bleed and her hemoglobin was 9.2 on discharge.  Patient had been on Coumadin since 07/2012 for history of pulmonary embolism and b/l DVT. This apparently occurred in the setting of using medroxyprogesterone injections for postmenopausal bleeding however the exact circumstances of whether this event was provoked are unclear to me . As per the patient's report she was told that she would require lifelong anticoagulation as per her cardiologist Dr. [name].  Patient was readmitted to the hospital on 07/03/2015 at the instruction of her cardiologist  for hemoglobin of 7.1. Evaluation showed some symptomatic anemia thrombocytopenia and mild hypotension. She received 1 unit of PRBCs and was started on oral iron supplementation. She was to follow-up with [facility] as an outpatient to get a capsule endoscopy.  Patient notes that she has tolerated her oral iron well. She notes solid black stools but no overt bleeding and no melena. Her hemoglobin levels had improved to 11.8 on 07/28/2015 with an MCV of 82.5. Hemoglobin today has improved to 12.4 with an MCV of 80.8 and normal ferritin levels. This suggests against additional bleeding. Patient continues to be on Coumadin at this time. Notes no other new focal symptoms. No fevers or chills/night sweats/weight loss.    MEDICAL HISTORY:  Past Medical History  Diagnosis Date  . Thyroid nodule     no meds  . Hypertensive heart disease   . Pulmonary embolism, bilateral (HCC)     a. chronic coumadin.  . DVT, bilateral lower limbs ([place])   . Arthritis     knees  . Postmenopausal vaginal bleeding   . Rash     under breasts  . Nocturia   . Obese   . Chronic combined systolic and diastolic CHF (congestive heart failure) ([place])     a. 07/2012 Echo: EF 55-60%, Gr1 DD;  b. 06/2015 Echo: EF 35-40%, triv AI, Mod MR, mod dil LA, mildly reduced RV.  . H/O cardiovascular stress test     a. 07/2012 MV: fixed mid-dist anterior and apical defect - scar vs attenuation, distal ant HK, no reversibility, EF 51%-->low risk.  Coumadin on 3 yrs for PE/DVT Post menopausal bleeding  3 yrs s/p endometrial ablation.  SURGICAL HISTORY: Past Surgical History  Procedure Laterality Date  . Cesarean section  1992  . Hysteroscopy w/d&c  03-24-2009  . Dilation and curettage of uterus  06/20/2012    Procedure: DILATATION AND CURETTAGE;  Surgeon: [name], MD;  Location: [facility];  Service: Gynecology;  Laterality: N/A;  . Dilation and evacution  1988    missed ab  . Cardiovascular stress test  08-07-2012  DR HILTY/ DR  BERRY    LOW RISK NUCLEAR STUDY/ NO ISCHEMIA/ FIXED MID TO DISTAL ANTERIOR AND APICAL DEFECT MAY REPRESENT SCAR VS BREAST ATTENUATION ARTIFACT/  MILD DISTAL ANTERIOR HYPOKINESIS/ EF 51%  . Transthoracic echocardiogram  08-08-2012    LVSF NORMAL/ EF A999333  GRADE I DIASTOLIC DYSFUNCTION/ MILD AV AND MV REGURG./  RVSF MILDLY REDUCED  . Dilitation & currettage/hystroscopy with thermachoice ablation N/A 09/04/2012    Procedure: DILATATION & CURETTAGE WITH THERMACHOICE ABLATION;  Surgeon: [name], MD;  Location: [facility];  Service: Gynecology;  Laterality: N/A;  . Esophagogastroduodenoscopy N/A 05/07/2015    Procedure: ESOPHAGOGASTRODUODENOSCOPY (EGD);  Surgeon: Laurence Spates, MD;  Location: [facility];  Service: Endoscopy;  Laterality: N/A;  . Colonoscopy N/A 05/08/2015    Procedure: COLONOSCOPY;  Surgeon: Laurence Spates, MD;  Location: [place];  Service: Endoscopy;  Laterality: N/A;    SOCIAL HISTORY: Social History   Social History  . Marital Status: Married    Spouse Name: N/A  . Number of Children: N/A  . Years of Education: N/A   Occupational History  . Not on file.   Social History Main Topics  . Smoking status: Never Smoker   . Smokeless tobacco: Never Used  . Alcohol Use: No  . Drug Use: No  . Sexual Activity: Not on file   Other Topics Concern  . Not on file   Social History Narrative  Retired Geographical information systems officer   FAMILY HISTORY: Family History  Problem Relation Age of Onset  . Coronary artery disease Father 49  . Stroke Father   . Sudden death Sister   . Hypertension Mother   . Mental illness Mother     anxiety    ALLERGIES:  is allergic to keflex and sulfa antibiotics.  MEDICATIONS:  Current Outpatient Prescriptions  Medication Sig Dispense Refill  . acetaminophen (TYLENOL) 325 MG tablet Take 650 mg by mouth every 6 (six) hours as needed for moderate pain.    . ferrous sulfate 325 (65 FE) MG tablet Take 1  tablet (325 mg total) by mouth 2 (two) times daily with a meal. 60 tablet 0  . furosemide (LASIX) 40 MG tablet Take 40 mg by mouth daily.     Marland Kitchen lisinopril (PRINIVIL,ZESTRIL) 2.5 MG tablet Take 1 tablet (2.5 mg total) by mouth daily. 30 tablet 6  . metoprolol (LOPRESSOR) 50 MG tablet Take 50 mg by mouth daily.     . nitroGLYCERIN (NITROSTAT) 0.4 MG SL tablet Place 1 tablet (0.4 mg total) under the tongue every 5 (five) minutes as needed for chest pain. 25 tablet 3  . nystatin (MYCOSTATIN/NYSTOP) 100000 UNIT/GM POWD Apply to your groin TID 15 g 0  . pantoprazole (PROTONIX) 40 MG tablet Take 1 tablet (40 mg total) by mouth 2 (two) times daily. 60 tablet 0  . potassium chloride SA (K-DUR,KLOR-CON) 20 MEQ tablet Take 2 tablets (40 mEq total) by mouth daily. 30 tablet 0  . warfarin (COUMADIN) 3 MG tablet TAKE 1 TO 1 AND  1/2 TABLETS BY MOUTH DAILY OR AS DIRECTED (Patient taking differently: takes 4.5mg  only on sun, takes 3mg  all other days) 120 tablet 1  . zolpidem (AMBIEN) 5 MG tablet Take 1 tablet (5 mg total) by mouth at bedtime as needed for sleep. 30 tablet 0   No current facility-administered medications for this visit.    REVIEW OF SYSTEMS:    10 Point review of Systems was done is negative except as noted above.  PHYSICAL EXAMINATION: ECOG PERFORMANCE STATUS: 2 - Symptomatic, <50% confined to bed  . Filed Vitals:   08/11/15 1102  BP: 124/84  Pulse: 64  Temp: 97.6 F (36.4 C)  Resp: 20   Filed Weights   08/11/15 1102  Weight: 195 lb 12.8 oz (88.814 kg)   .Body mass index is 35.8 kg/(m^2).  GENERAL:alert, in no acute distress and comfortable SKIN: No acute rashes EYES: normal, conjunctiva are pink and non-injected, sclera clear OROPHARYNX:no exudate, no erythema and lips, buccal mucosa, and tongue normal  NECK: supple, no JVD, thyroid normal size, non-tender, without nodularity LYMPH:  no palpable lymphadenopathy in the cervical, axillary or inguinal LUNGS: Minimal basal  rales, distant breath sounds. HEART: regular rate & rhythm,  no murmurs and bilateral 1+ edema ABDOMEN: abdomen obese, soft, non-tender, normoactive bowel sounds  Musculoskeletal: no cyanosis of digits and no clubbing  PSYCH: alert & oriented x 3 with fluent speech NEURO: no focal motor/sensory deficits  LABORATORY DATA:  I have reviewed the data as listed  . CBC Latest Ref Rng 08/11/2015 07/28/2015 07/20/2015  WBC 3.9 - 10.3 10e3/uL 6.5 6.7 6.7  Hemoglobin 11.6 - 15.9 g/dL 12.4 11.8(L) 11.1(L)  Hematocrit 34.8 - 46.6 % 40.3 41.5 38.0  Platelets 145 - 400 10e3/uL 270 334 444(H)    . CMP Latest Ref Rng 08/11/2015 07/28/2015 07/20/2015  Glucose 70 - 140 mg/dl 86 106(H) 102(H)  BUN 7.0 - 26.0 mg/dL 15.4 21(H) 19  Creatinine 0.6 - 1.1 mg/dL 0.8 0.85 0.84  Sodium 136 - 145 mEq/L 143 141 143  Potassium 3.5 - 5.1 mEq/L 4.0 3.8 3.9  Chloride 101 - 111 mmol/L - 102 100  CO2 22 - 29 mEq/L 29 29 30   Calcium 8.4 - 10.4 mg/dL 9.4 9.1 9.3  Total Protein 6.4 - 8.3 g/dL 7.1 - -  Total Bilirubin 0.20 - 1.20 mg/dL 0.65 - -  Alkaline Phos 40 - 150 U/L 103 - -  AST 5 - 34 U/L 25 - -  ALT 0 - 55 U/L 20 - -   . Lab Results  Component Value Date   IRON 14* 07/03/2015   TIBC 454* 07/03/2015   IRONPCTSAT 3* 07/03/2015   (Iron and TIBC)  Lab Results  Component Value Date   FERRITIN 229 08/11/2015     EGD 04/2015: ENDOSCOPIC IMPRESSION: 1. Anemia. No obvious source seen on upper G.I. Endoscopy  Colonoscopy 04/2015: ENDOSCOPIC IMPRESSION: 1. Anemia and anticoagulant patient no obvious source of bleeding seen RECOMMENDATIONS: 1. anticoagulation can be resumed. 2. Recommend routine colonoscopy screening repeat procedure in 10 years and begin stool for FOB and 4 to 5 years    RADIOGRAPHIC STUDIES: I have personally reviewed the radiological images as listed and agreed with the findings in the report. No results found.  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 Patient has been on Coumadin since March 2014 for PE and DVT. No overt evidence  of INR being significantly supratherapeutic. Patient had EGD and colonoscopy in December 2016 that did not reveal an overt GI source of bleeding.  Her anemia has now resolved after having received 5 units of PRBCs with 2 hospitalizations for blood loss anemia. Her ferritin levels today are within normal limits. Hemoglobin has normalized at 12.4. She has no clinical evidence of ongoing GI bleeding.  #2 history of DVT and PE in March 2014 on chronic anticoagulation. Plan  -Patient is tolerating oral iron. We'll continue to have her on ferrous sulfate 1 tablet orally twice a day with orange juice. -No indication for IV iron or PRBC transfusion at this time. -Avoid NSAIDs or other antiplatelet therapies. -Patient is currently on Coumadin. She notes that as per Dr. [name] she would need to be on this lifelong. We will defer to Dr. [name] regarding the evaluation of risks and benefit of ongoing anticoagulation. There was some concern that it might have been triggered by medroxyprogesterone therapy but the exact circumstances are unclear to me at this time. Patient denies any strong family history of thrombophilia. No other previous personal history of VTE. Was noted to have a silent MI. -If anticoagulation cessation is being contemplated would recommend checking for factor V Leiden mutation, prothrombin gene mutation and antiphospholipid antibody panel including lupus anticoagulant/anticardiolipin antibody and beta 2 glycoprotein antibodies. -We discussed weight reduction and avoiding dehydration as additional elements to reduce thrombosis risk. -Primary care physician and cardiology are following her to address her atherosclerotic risk factors. -She is to follow-up with [facility] gastroenterology to pursue getting a capsule endoscopy to rule out  any intestinal lesions/AVMs that might have a bearing on the decision for ongoing anticoagulation.  RTC with Dr. Irene Limbo in 2 months with repeat labs to ensure no additional drop in hgb.  All of the patients questions were answered to her apparent satisfaction. The patient knows to call the clinic with any problems, questions or concerns.  I spent 60 minutes counseling the patient face to face. The total time spent in the appointment was 60 minutes and more than 50% was on counseling and direct patient cares.    Sullivan Lone MD [place] AAHIVMS [facility] Hematology/Oncology Physician [facility]  (Office):       [phone] (Work cell):  [phone] (Fax):           [phone]  08/11/2015 12:01 PM

## 2015-08-13 DIAGNOSIS — I110 Hypertensive heart disease with heart failure: Secondary | ICD-10-CM | POA: Diagnosis not present

## 2015-08-13 DIAGNOSIS — D649 Anemia, unspecified: Secondary | ICD-10-CM | POA: Diagnosis not present

## 2015-08-13 DIAGNOSIS — M170 Bilateral primary osteoarthritis of knee: Secondary | ICD-10-CM | POA: Diagnosis not present

## 2015-08-13 DIAGNOSIS — I5043 Acute on chronic combined systolic (congestive) and diastolic (congestive) heart failure: Secondary | ICD-10-CM | POA: Diagnosis not present

## 2015-08-13 DIAGNOSIS — I82403 Acute embolism and thrombosis of unspecified deep veins of lower extremity, bilateral: Secondary | ICD-10-CM | POA: Diagnosis not present

## 2015-08-13 DIAGNOSIS — M6281 Muscle weakness (generalized): Secondary | ICD-10-CM | POA: Diagnosis not present

## 2015-08-14 DIAGNOSIS — I5043 Acute on chronic combined systolic (congestive) and diastolic (congestive) heart failure: Secondary | ICD-10-CM | POA: Diagnosis not present

## 2015-08-14 DIAGNOSIS — I110 Hypertensive heart disease with heart failure: Secondary | ICD-10-CM | POA: Diagnosis not present

## 2015-08-14 DIAGNOSIS — M6281 Muscle weakness (generalized): Secondary | ICD-10-CM | POA: Diagnosis not present

## 2015-08-14 DIAGNOSIS — I82403 Acute embolism and thrombosis of unspecified deep veins of lower extremity, bilateral: Secondary | ICD-10-CM | POA: Diagnosis not present

## 2015-08-14 DIAGNOSIS — M170 Bilateral primary osteoarthritis of knee: Secondary | ICD-10-CM | POA: Diagnosis not present

## 2015-08-14 DIAGNOSIS — D649 Anemia, unspecified: Secondary | ICD-10-CM | POA: Diagnosis not present

## 2015-08-18 DIAGNOSIS — D649 Anemia, unspecified: Secondary | ICD-10-CM | POA: Diagnosis not present

## 2015-08-18 DIAGNOSIS — I5043 Acute on chronic combined systolic (congestive) and diastolic (congestive) heart failure: Secondary | ICD-10-CM | POA: Diagnosis not present

## 2015-08-18 DIAGNOSIS — I82403 Acute embolism and thrombosis of unspecified deep veins of lower extremity, bilateral: Secondary | ICD-10-CM | POA: Diagnosis not present

## 2015-08-18 DIAGNOSIS — M6281 Muscle weakness (generalized): Secondary | ICD-10-CM | POA: Diagnosis not present

## 2015-08-18 DIAGNOSIS — I110 Hypertensive heart disease with heart failure: Secondary | ICD-10-CM | POA: Diagnosis not present

## 2015-08-18 DIAGNOSIS — M170 Bilateral primary osteoarthritis of knee: Secondary | ICD-10-CM | POA: Diagnosis not present

## 2015-08-19 ENCOUNTER — Encounter: Payer: Self-pay | Admitting: Cardiovascular Disease

## 2015-08-19 ENCOUNTER — Ambulatory Visit (INDEPENDENT_AMBULATORY_CARE_PROVIDER_SITE_OTHER): Payer: Medicare Other | Admitting: Cardiovascular Disease

## 2015-08-19 VITALS — BP 102/64 | HR 60 | Ht 62.0 in | Wt 196.0 lb

## 2015-08-19 DIAGNOSIS — I10 Essential (primary) hypertension: Secondary | ICD-10-CM

## 2015-08-19 DIAGNOSIS — I5043 Acute on chronic combined systolic (congestive) and diastolic (congestive) heart failure: Secondary | ICD-10-CM

## 2015-08-19 MED ORDER — FUROSEMIDE 40 MG PO TABS: 40.0000 mg | ORAL_TABLET | Freq: Two times a day (BID) | ORAL | Status: DC

## 2015-08-19 NOTE — Assessment & Plan Note (Signed)
History of large bilateral pulmonary emboli with DVT 08/07/12 with ultimate resolution of both by duplex and CT angiogram. She remains on Coumadin anticoagulation.

## 2015-08-19 NOTE — Assessment & Plan Note (Signed)
History of hypertension blood pressure measured at 102/64. She is on metoprolol and lisinopril. Continue current meds at current dosing

## 2015-08-19 NOTE — Progress Notes (Signed)
08/19/2015 Nicole Gibbs   1945/07/19  [id number]  Primary Physician [name], MD Primary Cardiologist: [name] MD Nicole Gibbs   HPI:  The patient returns today for followup. She is a 70 year old severely overweight, married Caucasian female, mother of one child, who I initially saw because of preop clearance and shortness of breath on August 02, 2012. I last saw her in the office 11/04/14.She ended up having bilateral DVTs and multiple pulmonary emboli and was admitted and placed on Lovenox and Coumadin. She had normal LV systolic function and mild RV dysfunction. Ultimately, because of vaginal bleeding, she underwent a D&C with endometrial ablation by Dr. Helane Rima April 22, [id number], without complication. She is no longer bleeding, and her shortness of breath has improved. Her Coumadin is followed here in our office. A followup CT angiogram revealed complete resolution of her pulmonary emboli but she did have an incidentally noted multinodular goiter. Follow-up lower extremity venous Doppler study showed resolution of her DVT. She does complain of progressive dyspnea which is somewhat limiting. She has been admitted to [facility] in December and again in February with anemia and heart failure. A recent Myoview stress test showed anteroapical scar and a 2-D echo revealed an ejection fraction of 35-40% which represents this is a significant decline compared to her EF 3 years ago which was normal. We appended to the right a left heart cath back in February which was plan because of symptomatic anemia requiring transfusion. She has had a negative upper and lower endoscopy with plans to perform pill endoscopy as well. Her hemoglobin has recently remained stable.   Current Outpatient Prescriptions  Medication Sig Dispense Refill  . acetaminophen (TYLENOL) 325 MG tablet Take 650 mg by mouth every 6 (six) hours as needed for moderate pain.    Marland Kitchen b complex vitamins capsule Take 1 capsule by  mouth daily. 30 capsule 1  . ferrous sulfate 325 (65 FE) MG tablet Take 1 tablet (325 mg total) by mouth 2 (two) times daily with a meal. 60 tablet 0  . furosemide (LASIX) 40 MG tablet Take 40 mg by mouth daily.     Marland Kitchen lisinopril (PRINIVIL,ZESTRIL) 2.5 MG tablet Take 1 tablet (2.5 mg total) by mouth daily. 30 tablet 6  . metoprolol (LOPRESSOR) 50 MG tablet Take 50 mg by mouth 2 (two) times daily.     . Multiple Vitamin (MULTIVITAMIN) tablet Take 1 tablet by mouth daily.    . nitroGLYCERIN (NITROSTAT) 0.4 MG SL tablet Place 1 tablet (0.4 mg total) under the tongue every 5 (five) minutes as needed for chest pain. 25 tablet 3  . nystatin (MYCOSTATIN/NYSTOP) 100000 UNIT/GM POWD Apply to your groin TID 15 g 0  . pantoprazole (PROTONIX) 40 MG tablet Take 1 tablet (40 mg total) by mouth 2 (two) times daily. 60 tablet 0  . potassium chloride SA (K-DUR,KLOR-CON) 20 MEQ tablet Take 2 tablets (40 mEq total) by mouth daily. 30 tablet 0  . warfarin (COUMADIN) 3 MG tablet TAKE 1 TO 1 AND 1/2 TABLETS BY MOUTH DAILY OR AS DIRECTED (Patient taking differently: takes 4.5mg  only on sun, takes 3mg  all other days) 120 tablet 1  . zolpidem (AMBIEN) 5 MG tablet Take 1 tablet (5 mg total) by mouth at bedtime as needed for sleep. 30 tablet 0   No current facility-administered medications for this visit.    Allergies  Allergen Reactions  . Keflex [Cephalexin] Diarrhea  . Sulfa Antibiotics Hives    Social History  Social History  . Marital Status: Married    Spouse Name: N/A  . Number of Children: N/A  . Years of Education: N/A   Occupational History  . Not on file.   Social History Main Topics  . Smoking status: Never Smoker   . Smokeless tobacco: Never Used  . Alcohol Use: No  . Drug Use: No  . Sexual Activity: Not on file   Other Topics Concern  . Not on file   Social History Narrative     Review of Systems: General: negative for chills, fever, night sweats or weight changes.  Cardiovascular:  negative for chest pain, dyspnea on exertion, edema, orthopnea, palpitations, paroxysmal nocturnal dyspnea or shortness of breath Dermatological: negative for rash Respiratory: negative for cough or wheezing Urologic: negative for hematuria Abdominal: negative for nausea, vomiting, diarrhea, bright red blood per rectum, melena, or hematemesis Neurologic: negative for visual changes, syncope, or dizziness All other systems reviewed and are otherwise negative except as noted above.    Blood pressure 102/64, pulse 60, height 5\' 2"  (1.575 m), weight 196 lb (88.905 kg).  General appearance: alert and no distress Neck: no adenopathy, no carotid bruit, no JVD, supple, symmetrical, trachea midline and thyroid not enlarged, symmetric, no tenderness/mass/nodules Lungs: clear to auscultation bilaterally Heart: regular rate and rhythm, S1, S2 normal, no murmur, click, rub or gallop Extremities: 2-3+ pitting edema bilaterally  EKG not performed today  ASSESSMENT AND PLAN:   HTN (hypertension) History of hypertension blood pressure measured at 102/64. She is on metoprolol and lisinopril. Continue current meds at current dosing  Pulmonary embolism, Bilateral History of large bilateral pulmonary emboli with DVT 08/07/12 with ultimate resolution of both by duplex and CT angiogram. She remains on Coumadin anticoagulation.  Acute on chronic combined systolic and diastolic congestive heart failure, NYHA class 3 ([place]) [name]  has a decline in her ejection fraction from 3 years ago from normal in 2014-35-40% by recent 2-D echo. A Myoview showed anteroapical scar. She was recently hospitalized with anemia and symptoms of heart failure required transfusion. Hemoglobin has remained stable. She was scheduled to undergo right and left heart cath on 07/06/15 which was whispering because of anemia requiring transfusion. Based on her stress test result and 2-D echo we will proceed with radial left heart cath to define  her anatomy and rule out ischemic etiology. I have reviewed the risks, indications, and alternatives to cardiac catheterization, possible angioplasty, and stenting with the patient. Risks include but are not limited to bleeding, infection, vascular injury, stroke, myocardial infection, arrhythmia, kidney injury, radiation-related injury in the case of prolonged fluoroscopy use, emergency cardiac surgery, and death. The patient understands the risks of serious complication is 1-2 in 123XX123 with diagnostic cardiac cath and 1-2% or less with angioplasty/stenting.       [name] MD FACP,FACC,FAHA, [facility] 08/19/2015 10:24 AM

## 2015-08-19 NOTE — Assessment & Plan Note (Signed)
Nicole Gibbs  has a decline in her ejection fraction from 3 years ago from normal in 2014-35-40% by recent 2-D echo. A Myoview showed anteroapical scar. She was recently hospitalized with anemia and symptoms of heart failure required transfusion. Hemoglobin has remained stable. She was scheduled to undergo right and left heart cath on 07/06/15 which was whispering because of anemia requiring transfusion. Based on her stress test result and 2-D echo we will proceed with radial left heart cath to define her anatomy and rule out ischemic etiology. I have reviewed the risks, indications, and alternatives to cardiac catheterization, possible angioplasty, and stenting with the patient. Risks include but are not limited to bleeding, infection, vascular injury, stroke, myocardial infection, arrhythmia, kidney injury, radiation-related injury in the case of prolonged fluoroscopy use, emergency cardiac surgery, and death. The patient understands the risks of serious complication is 1-2 in 123XX123 with diagnostic cardiac cath and 1-2% or less with angioplasty/stenting.

## 2015-08-19 NOTE — Patient Instructions (Addendum)
Medication Instructions:  Your physician has recommended you make the following change in your medication:  1- INCREASE FUROSEMIDE 40MG  BY MOUTH TWICE A DAY.   Labwork: Your physician recommends that you return for lab work ON August 27, 2015. The lab can be found on the FIRST FLOOR of out building in [address]   Testing/Procedures: Your physician has requested that you have a LEFT HEART cardiac catheterization. Cardiac catheterization is used to diagnose and/or treat various heart conditions. Doctors may recommend this procedure for a number of different reasons. The most common reason is to evaluate chest pain. Chest pain can be a symptom of coronary artery disease (CAD), and cardiac catheterization can show whether plaque is narrowing or blocking your heart's arteries. This procedure is also used to evaluate the valves, as well as measure the blood flow and oxygen levels in different parts of your heart. For further information please visit [email or web address][email or web address]. SCHEDULE FOR 09/03/15 SECOND CASE  Following your catheterization, you will not be allowed to drive for 3 days.  No lifting, pushing, or pulling greater that 10 pounds is allowed for 1 week.  You will be required to have the following tests prior to the procedure:  1. Blood work-the blood work can be done no more than 7 days prior to the procedure.  It can be done at any [facility] lab.  There is one downstairs on the first floor of this building and one in the [facility] building [phone]).    Puncture site RADIAL   Your physician recommends that you schedule a follow-up appointment in: [facility] [place].   Dr [name] has referred you to Estelle Grumbles, registered dietitian nutritionist.  I have sent her your information and she should be in contact with you.  Her contact information is [phone].    Any Other Special Instructions Will Be Listed Below (If  Applicable).     If you need a refill on your cardiac medications before your next appointment, please call your pharmacy.

## 2015-08-20 DIAGNOSIS — I82403 Acute embolism and thrombosis of unspecified deep veins of lower extremity, bilateral: Secondary | ICD-10-CM | POA: Diagnosis not present

## 2015-08-20 DIAGNOSIS — M170 Bilateral primary osteoarthritis of knee: Secondary | ICD-10-CM | POA: Diagnosis not present

## 2015-08-20 DIAGNOSIS — I5043 Acute on chronic combined systolic (congestive) and diastolic (congestive) heart failure: Secondary | ICD-10-CM | POA: Diagnosis not present

## 2015-08-20 DIAGNOSIS — D649 Anemia, unspecified: Secondary | ICD-10-CM | POA: Diagnosis not present

## 2015-08-20 DIAGNOSIS — I110 Hypertensive heart disease with heart failure: Secondary | ICD-10-CM | POA: Diagnosis not present

## 2015-08-20 DIAGNOSIS — M6281 Muscle weakness (generalized): Secondary | ICD-10-CM | POA: Diagnosis not present

## 2015-08-21 ENCOUNTER — Encounter: Payer: Medicare Other | Admitting: Pharmacist Clinician (PhC)/ Clinical Pharmacy Specialist

## 2015-08-24 ENCOUNTER — Ambulatory Visit (INDEPENDENT_AMBULATORY_CARE_PROVIDER_SITE_OTHER): Payer: Medicare Other | Admitting: Pharmacist Clinician (PhC)/ Clinical Pharmacy Specialist

## 2015-08-24 ENCOUNTER — Telehealth: Payer: Self-pay | Admitting: Pharmacist Clinician (PhC)/ Clinical Pharmacy Specialist

## 2015-08-24 DIAGNOSIS — I82403 Acute embolism and thrombosis of unspecified deep veins of lower extremity, bilateral: Secondary | ICD-10-CM | POA: Diagnosis not present

## 2015-08-24 DIAGNOSIS — I2782 Chronic pulmonary embolism: Secondary | ICD-10-CM

## 2015-08-24 DIAGNOSIS — I2601 Septic pulmonary embolism with acute cor pulmonale: Secondary | ICD-10-CM

## 2015-08-24 DIAGNOSIS — Z7901 Long term (current) use of anticoagulants: Secondary | ICD-10-CM

## 2015-08-24 DIAGNOSIS — I2699 Other pulmonary embolism without acute cor pulmonale: Secondary | ICD-10-CM

## 2015-08-24 LAB — POCT INR: INR: 2.6

## 2015-08-24 MED ORDER — ENOXAPARIN SODIUM 80 MG/0.8ML ~~LOC~~ SOLN: 80.0000 mg | Freq: Two times a day (BID) | SUBCUTANEOUS | Status: DC

## 2015-08-24 NOTE — Patient Instructions (Addendum)
Enoxaprin Dosing Schedule  Enoxparin dose:  80 mg   Date  Warfarin Dose (evenings) Enoxaprin Dose  4-14 6 3  mg (1 tab)   4-15 5 0   4-16 4 0 8 am   8 pm  4-17 3 0 8 am   8 pm  4-18 2 0 8 am   8 pm  4-19 1 0 8 am  4-20 Procedure 3 mg (1 tab)   4-21 1 4.5 mg (1.5 tabs) 8 am   8 pm  4-22 2 4.5 mg (1.5 tabs) 8 am   8 pm  4-23 3 4.5 mg (1.5 tabs) 8 am   8 pm  4-24 4 4.5 mg (1.5 tabs) 8 am   8 pm  4-25 5 Check INR 8 am   8 pm  4-26 6

## 2015-08-24 NOTE — Telephone Encounter (Signed)
Pt called in wanting to speak with Nicole Gibbs about her being on coumadin . She says that she is suppose to have a procedure done soon and that requires her holding her Coumdain. Please f/u with her   Thanks

## 2015-08-24 NOTE — Telephone Encounter (Signed)
Pt to come in today for lovenox information.

## 2015-08-25 ENCOUNTER — Ambulatory Visit
Admission: RE | Admit: 2015-08-25 | Discharge: 2015-08-25 | Disposition: A | Discharge status: Home / Self Care | Payer: Medicare Other | Source: Ambulatory Visit | Attending: Endocrinology | Admitting: Endocrinology

## 2015-08-25 DIAGNOSIS — E049 Nontoxic goiter, unspecified: Secondary | ICD-10-CM

## 2015-08-25 DIAGNOSIS — I110 Hypertensive heart disease with heart failure: Secondary | ICD-10-CM | POA: Diagnosis not present

## 2015-08-25 DIAGNOSIS — D649 Anemia, unspecified: Secondary | ICD-10-CM | POA: Diagnosis not present

## 2015-08-25 DIAGNOSIS — I82403 Acute embolism and thrombosis of unspecified deep veins of lower extremity, bilateral: Secondary | ICD-10-CM | POA: Diagnosis not present

## 2015-08-25 DIAGNOSIS — M6281 Muscle weakness (generalized): Secondary | ICD-10-CM | POA: Diagnosis not present

## 2015-08-25 DIAGNOSIS — M170 Bilateral primary osteoarthritis of knee: Secondary | ICD-10-CM | POA: Diagnosis not present

## 2015-08-25 DIAGNOSIS — E042 Nontoxic multinodular goiter: Secondary | ICD-10-CM | POA: Diagnosis not present

## 2015-08-25 DIAGNOSIS — I5043 Acute on chronic combined systolic (congestive) and diastolic (congestive) heart failure: Secondary | ICD-10-CM | POA: Diagnosis not present

## 2015-08-27 ENCOUNTER — Encounter: Payer: Self-pay | Admitting: *Deleted

## 2015-08-27 ENCOUNTER — Other Ambulatory Visit: Payer: Self-pay | Admitting: *Deleted

## 2015-08-27 DIAGNOSIS — M6281 Muscle weakness (generalized): Secondary | ICD-10-CM | POA: Diagnosis not present

## 2015-08-27 DIAGNOSIS — I5043 Acute on chronic combined systolic (congestive) and diastolic (congestive) heart failure: Secondary | ICD-10-CM | POA: Diagnosis not present

## 2015-08-27 DIAGNOSIS — I110 Hypertensive heart disease with heart failure: Secondary | ICD-10-CM | POA: Diagnosis not present

## 2015-08-27 DIAGNOSIS — I82403 Acute embolism and thrombosis of unspecified deep veins of lower extremity, bilateral: Secondary | ICD-10-CM | POA: Diagnosis not present

## 2015-08-27 DIAGNOSIS — M170 Bilateral primary osteoarthritis of knee: Secondary | ICD-10-CM | POA: Diagnosis not present

## 2015-08-27 DIAGNOSIS — D649 Anemia, unspecified: Secondary | ICD-10-CM | POA: Diagnosis not present

## 2015-08-27 NOTE — Patient Outreach (Signed)
[facility]) Care Management   08/27/2015  Adel Contes January 17, 1946 [id number]  Patrycja Bagshaw is an 70 y.o. female  Subjective: Patient states "feeling pretty well", "feeling so much better". She reports being "out and about", drove herself and daughter to [place] yesterday to visit her mother's house.   Objective: BP 130/78 mmHg  Pulse 64  Resp 16  SpO2 96%   Review of Systems  Constitutional: Negative.   HENT: Negative.   Eyes: Negative.        Wears eyeglasses  Respiratory: Negative.  Negative for cough, sputum production, shortness of breath and wheezing.        Lung sounds clear to auscultation Respirations even and unlabored  Cardiovascular: Positive for leg swelling. Negative for chest pain.       Occasional irregular heart beats Bilateral lower extremity swelling  Gastrointestinal: Negative.  Negative for abdominal pain, diarrhea and constipation.       Obese Abdomen soft, non-tender Bowel sounds present  Genitourinary: Positive for frequency.  Musculoskeletal: Positive for falls.       History of left wrist surgery  Skin: Negative.   Neurological: Negative.   Endo/Heme/Allergies: Negative.   Psychiatric/Behavioral: Negative.     Physical Exam  Encounter Medications:   Outpatient Encounter Prescriptions as of 08/27/2015  Medication Sig Note  . acetaminophen (TYLENOL) 325 MG tablet Take 650 mg by mouth every 6 (six) hours as needed for moderate pain.   . ferrous sulfate 325 (65 FE) MG tablet Take 1 tablet (325 mg total) by mouth 2 (two) times daily with a meal.   . furosemide (LASIX) 40 MG tablet Take 1 tablet (40 mg total) by mouth 2 (two) times daily.   Marland Kitchen lisinopril (PRINIVIL,ZESTRIL) 2.5 MG tablet Take 1 tablet (2.5 mg total) by mouth daily.   . metoprolol (LOPRESSOR) 50 MG tablet Take 50 mg by mouth 2 (two) times daily.    . Multiple Vitamin (MULTIVITAMIN) tablet Take 1 tablet by mouth daily.   . nitroGLYCERIN (NITROSTAT) 0.4 MG SL tablet  Place 1 tablet (0.4 mg total) under the tongue every 5 (five) minutes as needed for chest pain.   . pantoprazole (PROTONIX) 40 MG tablet Take 1 tablet (40 mg total) by mouth 2 (two) times daily.   . potassium chloride SA (K-DUR,KLOR-CON) 20 MEQ tablet Take 2 tablets (40 mEq total) by mouth daily.   Marland Kitchen warfarin (COUMADIN) 3 MG tablet TAKE 1 TO 1 AND 1/2 TABLETS BY MOUTH DAILY OR AS DIRECTED (Patient taking differently: takes 4.5mg  only on sun, takes 3mg  all other days) 07/03/2015: Stopped on Mon or Tues this week  . zolpidem (AMBIEN) 5 MG tablet Take 1 tablet (5 mg total) by mouth at bedtime as needed for sleep. 07/29/2015: Medication ran out  . b complex vitamins capsule Take 1 capsule by mouth daily. (Patient not taking: Reported on 08/27/2015)   . enoxaparin (LOVENOX) 80 MG/0.8ML injection Inject 0.8 mLs (80 mg total) into the skin every 12 (twelve) hours.   Marland Kitchen nystatin (MYCOSTATIN/NYSTOP) 100000 UNIT/GM POWD Apply to your groin TID (Patient not taking: Reported on 08/27/2015)    No facility-administered encounter medications on file as of 08/27/2015.    Functional Status:   In your present state of health, do you have any difficulty performing the following activities: 08/27/2015 08/12/2015  Hearing? - N  Vision? - N  Difficulty concentrating or making decisions? - N  Walking or climbing stairs? Y -  Dressing or bathing? - N  Doing errands,  shopping? - Y  Preparing Food and eating ? N -  Using the Toilet? - N  In the past six months, have you accidently leaked urine? - N  Do you have problems with loss of bowel control? - N  Managing your Medications? - N  Managing your Finances? - Y  Housekeeping or managing your Housekeeping? - Y    Fall/Depression Screening:    PHQ 2/9 Scores 08/12/2015 07/28/2015 07/21/2015 05/06/2015  PHQ - 2 Score 0 0 0 0    Assessment:   70  Year old female being seen for congestive heart failure. Arrived at patient's home. Patient, husband and daughter were in the  house during the visit.  Patient categorized herself under "green zone" and free of signs and symptoms of heart failure. Patient denies any bleeding at the moment as well. Patient's weight ranges from 193 - 197 pounds without  reportable weight gain upon review of her list.  He blood pressure readings range from 115/60 to 130/78. Edema to bilateral lower extremities remains per usual. Cardiologist (Dr. Gery Pray) saw patient on 4/10 with new order to increase Lasix to 40 mg 2 tablets daily.  Patient reports elevation of lower extremities while resting or in bed to help prevent increased edema. Home health nurse as well as physical therapy still continues to see patient twice a week.   Patient has upcoming appointment with Dr. Gery Pray on 4/20 for cardiac catheterization and reinforced with patient regarding instructions on when to hold Coumadin and when to start Lovenox injection in preparation for the cardiac procedure. Patient's daughter will be assisting patient with her injections.  Call was also placed during this visit to Dr. Talmage Nap (endocrinologist) to verify patient's concern regarding blood thinner medications, to make sure it will not interfere with any procedure planned to be done with patient on scheduled appointment on 4/18. Awaiting for return call from Dr. Willeen Cass office for any further instructions.   Patient reports she had been driving and transporting self to her appointments.  Patient reports receiving a meal from "meals on wheels" last Tuesday but will follow-up after Easter weekend since it stopped.  Patient mentions buying more vegetables and fruits as healthier food choices per patient. Care management coordinator assisted patient in calling primary provider's office to get referral information to nutritionist [facility]) so patient can set-up an appointment to get assistance with appropriate food choices.  Patient is unable to identify any additional needs or issues at this time. She  agreed to home visit next month. Encouraged patient to call [facility] management coordinator or 24-hour nurse line as necessary. Contact information with patient.    Plan: Routine home visit on 09/24/15.    THN CM Care Plan Problem One        Most Recent Value   Care Plan Problem One  at risk for readmission   Role Documenting the Problem One  Care Management Coordinator   Care Plan for Problem One  Active   THN Long Term Goal (31-90 days)  patient will not be redmitted in the next 60 days    THN Long Term Goal Start Date  07/29/15   Interventions for Problem One Long Term Goal  reiterate adherence to low salt diet and 1.5 liters per day fluid restriction, encourage patient to monitor signs and symptoms of bleeding (feeling dizzy/ lightheaded,shortness of breath,extreme weakness,bloody/ black, tarry stools,bleeding from any body parts) and report if any,  emphasize medication adherence (especially iron),  encourage attendance to follow-up appointments  with providers,  monitor for signs and symptoms of heart failure flare-up and call the doctor for help,  continue to weigh self daily and record   THN CM Short Term Goal #1 (0-30 days)  Patient will report no signs and symptoms of bleeding in the next 30 days   THN CM Short Term Goal #1 Start Date  07/29/15   Interventions for Short Term Goal #1  emphasize monitoring self for bleeding from any body parts and report if any,  educate patient to watch for extreme weakness, chest pain,shortness of breath,dizziness,bloody or black,tarry stools and seek for medical assistance if develops any,  attend scheduled appointment with hematologist (Dr. Candise Che) and gastroenterologist (Dr. Randa Evens)   [facility] CM Short Term Goal #2 (0-30 days)  Patient will report attending follow-up appointments with providers (primary care provider, gastro, cardio) in the next 30 days    THN CM Short Term Goal #2 Start Date  07/29/15   [facility] CM Short Term Goal #2 Met Date  08/11/15 [Goal  met- seen PCP 3/15,  cardio on 3/1 & GI on 3/7]   THN CM Short Term Goal #3 (0-30 days)  Patient will report active participation with home health services to regain strength in the next 30 days   THN CM Short Term Goal #3 Start Date  07/29/15   Interventions for Short Tern Goal #3  update with patient to make sure home health services ([facility]) still ongoing,  encourage to participate with services rendered and perform treatment/ exercises to regain strength/ endurance,  remind patient to list down schedule of home health staff on her calendar to be aware and be ready to participate when they come and work with her    [facility] CM Care Plan Problem Two        Most Recent Value   Care Plan Problem Two  Knowledge deficit regarding heart failure management   Role Documenting the Problem Two  Care Management Coordinator   Care Plan for Problem Two  Active   Interventions for Problem Two Long Term Goal   review THN heart failure packet with patient,  encourage to continue to read the booklet regarding the 5 simple steps of living life better with heart failure and ask questions if any,  emphasize on low salt diet and follow food list of low salt/ high salt foods provided,  emphasize 1.5 liter fluid restriction as ordered,  encourage to continue monitoring weight daily and record, evaluate self daily using heart failure zone tool, medication adherence,performing exercises with therapist as tolerated   THN Long Term Goal (31-90) days  Patient will verbalize the 5 simple steps of managing heart failure within the next 60 days   THN Long Term Goal Start Date  07/29/15   [facility] CM Short Term Goal #1 (0-30 days)  Patient will identify the heart failure zones in the next 30 days   THN CM Short Term Goal #1 Start Date  07/29/15   [facility] CM Short Term Goal #1 Met Date   08/27/15 [Goal met.]   THN CM Short Term Goal #2 (0-30 days)  Patient will verbalize at least 3 signs and symptoms under each heart failure zone and when to  call the doctor for help in the next 30 days    THN CM Short Term Goal #2 Start Date  07/29/15   Interventions for Short Term Goal #2  ensure placement of THN heart failure magnet on refrigerator door for easy visualization / access,  encourage  patient to continue reading [facility] packet provided,  review the different signs and symptoms under each heart failure zones,  emphasize the signs and symptoms of heart failure flare-up under yellow zone and call the doctor for help if develop any,  encourage patient to monitor increased swelling to legs/ankles,  advise patient to elevate lower extremities at intervals and when in bed as well as use of compression socks to help decrease swelling    THN CM Short Term Goal #3 (0-30 days)  Patient will continue to weigh daily and record in the next 30 days   THN CM Short Term Goal #3 Start Date  07/29/15 [patient keeps record of her daily weights]   Interventions for Short Term Goal #3  emphasize importance of monitoring weight daily and record,  encourage to check weight in the morning after using the bathroom,before getting dressed and before eating breakfast,  explain to monitor 3 pounds weight gain overnight or 5 pounds in a week and report to provider     Karin Golden A. Lizandra Zakrzewski, BSN, RN-BC [facility] Care Management Coordinator Cell: [phone]

## 2015-08-28 DIAGNOSIS — D649 Anemia, unspecified: Secondary | ICD-10-CM | POA: Diagnosis not present

## 2015-08-28 DIAGNOSIS — M6281 Muscle weakness (generalized): Secondary | ICD-10-CM | POA: Diagnosis not present

## 2015-08-28 DIAGNOSIS — I82403 Acute embolism and thrombosis of unspecified deep veins of lower extremity, bilateral: Secondary | ICD-10-CM | POA: Diagnosis not present

## 2015-08-28 DIAGNOSIS — M170 Bilateral primary osteoarthritis of knee: Secondary | ICD-10-CM | POA: Diagnosis not present

## 2015-08-28 DIAGNOSIS — I5043 Acute on chronic combined systolic (congestive) and diastolic (congestive) heart failure: Secondary | ICD-10-CM | POA: Diagnosis not present

## 2015-08-28 DIAGNOSIS — I110 Hypertensive heart disease with heart failure: Secondary | ICD-10-CM | POA: Diagnosis not present

## 2015-08-31 DIAGNOSIS — M6281 Muscle weakness (generalized): Secondary | ICD-10-CM | POA: Diagnosis not present

## 2015-08-31 DIAGNOSIS — I110 Hypertensive heart disease with heart failure: Secondary | ICD-10-CM | POA: Diagnosis not present

## 2015-08-31 DIAGNOSIS — M170 Bilateral primary osteoarthritis of knee: Secondary | ICD-10-CM | POA: Diagnosis not present

## 2015-08-31 DIAGNOSIS — I5043 Acute on chronic combined systolic (congestive) and diastolic (congestive) heart failure: Secondary | ICD-10-CM | POA: Diagnosis not present

## 2015-08-31 DIAGNOSIS — D649 Anemia, unspecified: Secondary | ICD-10-CM | POA: Diagnosis not present

## 2015-08-31 DIAGNOSIS — I82403 Acute embolism and thrombosis of unspecified deep veins of lower extremity, bilateral: Secondary | ICD-10-CM | POA: Diagnosis not present

## 2015-09-01 DIAGNOSIS — I5043 Acute on chronic combined systolic (congestive) and diastolic (congestive) heart failure: Secondary | ICD-10-CM | POA: Diagnosis not present

## 2015-09-01 DIAGNOSIS — I10 Essential (primary) hypertension: Secondary | ICD-10-CM | POA: Diagnosis not present

## 2015-09-01 DIAGNOSIS — I509 Heart failure, unspecified: Secondary | ICD-10-CM | POA: Diagnosis not present

## 2015-09-01 DIAGNOSIS — E049 Nontoxic goiter, unspecified: Secondary | ICD-10-CM | POA: Diagnosis not present

## 2015-09-02 ENCOUNTER — Other Ambulatory Visit: Payer: Self-pay

## 2015-09-02 ENCOUNTER — Other Ambulatory Visit: Payer: Self-pay | Admitting: Endocrinology

## 2015-09-02 ENCOUNTER — Telehealth: Payer: Self-pay | Admitting: Cardiovascular Disease

## 2015-09-02 DIAGNOSIS — I82403 Acute embolism and thrombosis of unspecified deep veins of lower extremity, bilateral: Secondary | ICD-10-CM | POA: Diagnosis not present

## 2015-09-02 DIAGNOSIS — I5043 Acute on chronic combined systolic (congestive) and diastolic (congestive) heart failure: Secondary | ICD-10-CM | POA: Diagnosis not present

## 2015-09-02 DIAGNOSIS — E041 Nontoxic single thyroid nodule: Secondary | ICD-10-CM

## 2015-09-02 DIAGNOSIS — M170 Bilateral primary osteoarthritis of knee: Secondary | ICD-10-CM | POA: Diagnosis not present

## 2015-09-02 DIAGNOSIS — D649 Anemia, unspecified: Secondary | ICD-10-CM | POA: Diagnosis not present

## 2015-09-02 DIAGNOSIS — R9439 Abnormal result of other cardiovascular function study: Secondary | ICD-10-CM

## 2015-09-02 DIAGNOSIS — I110 Hypertensive heart disease with heart failure: Secondary | ICD-10-CM | POA: Diagnosis not present

## 2015-09-02 DIAGNOSIS — M6281 Muscle weakness (generalized): Secondary | ICD-10-CM | POA: Diagnosis not present

## 2015-09-02 LAB — PROTIME-INR
INR: 1.37 (ref <1.50)
PROTHROMBIN TIME: 17 s — AB (ref 11.6–15.2)

## 2015-09-02 LAB — BASIC METABOLIC PANEL
BUN: 18 mg/dL (ref 7–25)
CHLORIDE: 107 mmol/L (ref 98–110)
CO2: 30 mmol/L (ref 20–31)
Calcium: 9.2 mg/dL (ref 8.6–10.4)
Creat: 0.7 mg/dL (ref 0.50–0.99)
Glucose, Bld: 79 mg/dL (ref 65–99)
POTASSIUM: 4.3 mmol/L (ref 3.5–5.3)
Sodium: 143 mmol/L (ref 135–146)

## 2015-09-02 LAB — CBC WITH DIFFERENTIAL/PLATELET
BASOS PCT: 1 %
Basophils Absolute: 57 cells/uL (ref 0–200)
EOS PCT: 3 %
Eosinophils Absolute: 171 cells/uL (ref 15–500)
HCT: 42.6 % (ref 35.0–45.0)
Hemoglobin: 13 g/dL (ref 11.7–15.5)
LYMPHS PCT: 31 %
Lymphs Abs: 1767 cells/uL (ref 850–3900)
MCH: 26.3 pg — ABNORMAL LOW (ref 27.0–33.0)
MCHC: 30.5 g/dL — ABNORMAL LOW (ref 32.0–36.0)
MCV: 86.2 fL (ref 80.0–100.0)
MONOS PCT: 10 %
Monocytes Absolute: 570 cells/uL (ref 200–950)
Neutro Abs: 3135 cells/uL (ref 1500–7800)
Neutrophils Relative %: 55 %
PLATELETS: 314 10*3/uL (ref 140–400)
RBC: 4.94 MIL/uL (ref 3.80–5.10)
RDW: 22.7 % — AB (ref 11.0–15.0)
WBC: 5.7 10*3/uL (ref 3.8–10.8)

## 2015-09-02 LAB — APTT: aPTT: 41 seconds — ABNORMAL HIGH (ref 24–37)

## 2015-09-02 LAB — TSH: TSH: 0.4 mIU/L

## 2015-09-02 NOTE — Telephone Encounter (Signed)
INFORMED Nicole Gibbs AT [place] ROUTED TO [place]

## 2015-09-02 NOTE — Telephone Encounter (Signed)
Olin Hauser is calling because Nicole Gibbs is supposed to have a Thyroid Biopsy and going to have a cath done tomorrow. So she has been taken of of the Warfarin in and put on Lovenox and will be bridged back to the [facility] on Sunday , but they are needing to patient to be off of her blood thinners for the biopsy, ( has to hold the Warfarin for 4 days with a Inr the day of the procedure) . At what point will she need to go off of the Warfarin. Please call . ( If you dont get her , please select the operator )    Thanks

## 2015-09-03 ENCOUNTER — Ambulatory Visit (HOSPITAL_COMMUNITY)
Admission: RE | Admit: 2015-09-03 | Discharge: 2015-09-03 | Disposition: A | Discharge status: Home / Self Care | Payer: Medicare Other | Source: Ambulatory Visit | Attending: Cardiovascular Disease | Admitting: Cardiovascular Disease

## 2015-09-03 ENCOUNTER — Encounter (HOSPITAL_COMMUNITY)
Admission: RE | Disposition: A | Discharge status: Home / Self Care | Payer: Self-pay | Source: Ambulatory Visit | Attending: Cardiovascular Disease

## 2015-09-03 DIAGNOSIS — Z86718 Personal history of other venous thrombosis and embolism: Secondary | ICD-10-CM | POA: Insufficient documentation

## 2015-09-03 DIAGNOSIS — Z7901 Long term (current) use of anticoagulants: Secondary | ICD-10-CM | POA: Diagnosis not present

## 2015-09-03 DIAGNOSIS — Z86711 Personal history of pulmonary embolism: Secondary | ICD-10-CM | POA: Diagnosis not present

## 2015-09-03 DIAGNOSIS — I5043 Acute on chronic combined systolic (congestive) and diastolic (congestive) heart failure: Secondary | ICD-10-CM | POA: Diagnosis present

## 2015-09-03 DIAGNOSIS — Z882 Allergy status to sulfonamides status: Secondary | ICD-10-CM | POA: Insufficient documentation

## 2015-09-03 DIAGNOSIS — I5042 Chronic combined systolic (congestive) and diastolic (congestive) heart failure: Secondary | ICD-10-CM | POA: Diagnosis not present

## 2015-09-03 DIAGNOSIS — I110 Hypertensive heart disease with heart failure: Secondary | ICD-10-CM | POA: Diagnosis not present

## 2015-09-03 DIAGNOSIS — I429 Cardiomyopathy, unspecified: Secondary | ICD-10-CM | POA: Insufficient documentation

## 2015-09-03 DIAGNOSIS — D649 Anemia, unspecified: Secondary | ICD-10-CM | POA: Insufficient documentation

## 2015-09-03 DIAGNOSIS — R9439 Abnormal result of other cardiovascular function study: Secondary | ICD-10-CM | POA: Diagnosis not present

## 2015-09-03 HISTORY — PX: CARDIAC CATHETERIZATION: SHX172

## 2015-09-03 SURGERY — LEFT HEART CATH AND CORONARY ANGIOGRAPHY

## 2015-09-03 MED ORDER — SODIUM CHLORIDE 0.9% FLUSH: 3.0000 mL | INTRAVENOUS | Status: DC | PRN

## 2015-09-03 MED ORDER — VERAPAMIL HCL 2.5 MG/ML IV SOLN
INTRA_ARTERIAL | Status: DC | PRN
  Administered 2015-09-03: 15 mL via INTRA_ARTERIAL

## 2015-09-03 MED ORDER — FENTANYL CITRATE (PF) 100 MCG/2ML IJ SOLN
INTRAMUSCULAR | Status: DC | PRN
  Administered 2015-09-03: 25 ug via INTRAVENOUS

## 2015-09-03 MED ORDER — LIDOCAINE HCL (PF) 1 % IJ SOLN
INTRAMUSCULAR | Status: DC | PRN
  Administered 2015-09-03: 5 mL

## 2015-09-03 MED ORDER — MIDAZOLAM HCL 2 MG/2ML IJ SOLN
INTRAMUSCULAR | Status: AC
  Filled 2015-09-03: qty 2

## 2015-09-03 MED ORDER — VERAPAMIL HCL 2.5 MG/ML IV SOLN
INTRAVENOUS | Status: AC
  Filled 2015-09-03: qty 2

## 2015-09-03 MED ORDER — MIDAZOLAM HCL 2 MG/2ML IJ SOLN
INTRAMUSCULAR | Status: DC | PRN
  Administered 2015-09-03: 1 mg via INTRAVENOUS

## 2015-09-03 MED ORDER — FENTANYL CITRATE (PF) 100 MCG/2ML IJ SOLN
INTRAMUSCULAR | Status: AC
  Filled 2015-09-03: qty 2

## 2015-09-03 MED ORDER — IOPAMIDOL (ISOVUE-370) INJECTION 76%
INTRAVENOUS | Status: DC | PRN
  Administered 2015-09-03: 90 mL

## 2015-09-03 MED ORDER — IOPAMIDOL (ISOVUE-370) INJECTION 76%
INTRAVENOUS | Status: AC
  Filled 2015-09-03: qty 100

## 2015-09-03 MED ORDER — HEPARIN (PORCINE) IN NACL 2-0.9 UNIT/ML-% IJ SOLN
INTRAMUSCULAR | Status: AC
  Filled 2015-09-03: qty 500

## 2015-09-03 MED ORDER — HEPARIN (PORCINE) IN NACL 2-0.9 UNIT/ML-% IJ SOLN
INTRAMUSCULAR | Status: DC | PRN
  Administered 2015-09-03: 1500 mL

## 2015-09-03 MED ORDER — ASPIRIN 81 MG PO CHEW
CHEWABLE_TABLET | ORAL | Status: AC
  Administered 2015-09-03: 81 mg
  Filled 2015-09-03: qty 1

## 2015-09-03 MED ORDER — NITROGLYCERIN 1 MG/10 ML FOR IR/CATH LAB
INTRA_ARTERIAL | Status: AC
  Filled 2015-09-03: qty 10

## 2015-09-03 MED ORDER — HEPARIN SODIUM (PORCINE) 1000 UNIT/ML IJ SOLN
INTRAMUSCULAR | Status: AC
Start: 2015-09-03 — End: 2015-09-03
  Filled 2015-09-03: qty 1

## 2015-09-03 MED ORDER — SODIUM CHLORIDE 0.9 % WEIGHT BASED INFUSION
3.0000 mL/kg/h | INTRAVENOUS | Status: DC
  Administered 2015-09-03: 3 mL/kg/h via INTRAVENOUS

## 2015-09-03 MED ORDER — SODIUM CHLORIDE 0.9 % WEIGHT BASED INFUSION: 1.0000 mL/kg/h | INTRAVENOUS | Status: DC

## 2015-09-03 MED ORDER — HEPARIN SODIUM (PORCINE) 1000 UNIT/ML IJ SOLN
INTRAMUSCULAR | Status: DC | PRN
  Administered 2015-09-03: 4000 [IU] via INTRAVENOUS

## 2015-09-03 SURGICAL SUPPLY — 11 items

## 2015-09-03 NOTE — Interval H&P Note (Signed)
Cath Lab Visit (complete for each Cath Lab visit)  Clinical Evaluation Leading to the Procedure:   ACS: No.  Non-ACS:    Anginal Classification: CCS II  Anti-ischemic medical therapy: Maximal Therapy (2 or more classes of medications)  Non-Invasive Test Results: Intermediate-risk stress test findings: cardiac mortality 1-3%/year  Prior CABG: No previous CABG      History and Physical Interval Note:  09/03/2015 9:04 AM  Nicole Gibbs  has presented today for surgery, with the diagnosis of shortness of breath  The various methods of treatment have been discussed with the patient and family. After consideration of risks, benefits and other options for treatment, the patient has consented to  Procedure(s): Left Heart Cath and Coronary Angiography (N/A) as a surgical intervention .  The patient's history has been reviewed, patient examined, no change in status, stable for surgery.  I have reviewed the patient's chart and labs.  Questions were answered to the patient's satisfaction.     Quay Burow

## 2015-09-03 NOTE — Progress Notes (Signed)
Pt and family given discharge instructions and was able to verbalize understanding.  Pt denies any discomfort at this time. Pt to car via wheelchair and with instructions.

## 2015-09-03 NOTE — H&P (View-Only) (Signed)
08/19/2015 Nicole Gibbs   07-20-1945  [id number]  Primary Physician [name], MD Primary Cardiologist: [name] MD [name]   HPI:  The patient returns today for followup. She is a 70 year old severely overweight, married Caucasian female, mother of one child, who I initially saw because of preop clearance and shortness of breath on August 02, 2012. I last saw her in the office 11/04/14.She ended up having bilateral DVTs and multiple pulmonary emboli and was admitted and placed on Lovenox and Coumadin. She had normal LV systolic function and mild RV dysfunction. Ultimately, because of vaginal bleeding, she underwent a D&C with endometrial ablation by Dr. Helane Rima April 22, [id number], without complication. She is no longer bleeding, and her shortness of breath has improved. Her Coumadin is followed here in our office. A followup CT angiogram revealed complete resolution of her pulmonary emboli but she did have an incidentally noted multinodular goiter. Follow-up lower extremity venous Doppler study showed resolution of her DVT. She does complain of progressive dyspnea which is somewhat limiting. She has been [facility] December and again in February with anemia and heart failure. A recent Myoview stress test showed anteroapical scar and a 2-D echo revealed an ejection fraction of 35-40% which represents this is a significant decline compared to her EF 3 years ago which was normal. We appended to the right a left heart cath back in February which was plan because of symptomatic anemia requiring transfusion. She has had a negative upper and lower endoscopy with plans to perform pill endoscopy as well. Her hemoglobin has recently remained stable.   Current Outpatient Prescriptions  Medication Sig Dispense Refill  . acetaminophen (TYLENOL) 325 MG tablet Take 650 mg by mouth every 6 (six) hours as needed for moderate pain.    Marland Kitchen b complex vitamins capsule Take 1 capsule by  mouth daily. 30 capsule 1  . ferrous sulfate 325 (65 FE) MG tablet Take 1 tablet (325 mg total) by mouth 2 (two) times daily with a meal. 60 tablet 0  . furosemide (LASIX) 40 MG tablet Take 40 mg by mouth daily.     Marland Kitchen lisinopril (PRINIVIL,ZESTRIL) 2.5 MG tablet Take 1 tablet (2.5 mg total) by mouth daily. 30 tablet 6  . metoprolol (LOPRESSOR) 50 MG tablet Take 50 mg by mouth 2 (two) times daily.     . Multiple Vitamin (MULTIVITAMIN) tablet Take 1 tablet by mouth daily.    . nitroGLYCERIN (NITROSTAT) 0.4 MG SL tablet Place 1 tablet (0.4 mg total) under the tongue every 5 (five) minutes as needed for chest pain. 25 tablet 3  . nystatin (MYCOSTATIN/NYSTOP) 100000 UNIT/GM POWD Apply to your groin TID 15 g 0  . pantoprazole (PROTONIX) 40 MG tablet Take 1 tablet (40 mg total) by mouth 2 (two) times daily. 60 tablet 0  . potassium chloride SA (K-DUR,KLOR-CON) 20 MEQ tablet Take 2 tablets (40 mEq total) by mouth daily. 30 tablet 0  . warfarin (COUMADIN) 3 MG tablet TAKE 1 TO 1 AND 1/2 TABLETS BY MOUTH DAILY OR AS DIRECTED (Patient taking differently: takes 4.5mg  only on sun, takes 3mg  all other days) 120 tablet 1  . zolpidem (AMBIEN) 5 MG tablet Take 1 tablet (5 mg total) by mouth at bedtime as needed for sleep. 30 tablet 0   No current facility-administered medications for this visit.    Allergies  Allergen Reactions  . Keflex [Cephalexin] Diarrhea  . Sulfa Antibiotics Hives    Social History  Social History  . Marital Status: Married    Spouse Name: N/A  . Number of Children: N/A  . Years of Education: N/A   Occupational History  . Not on file.   Social History Main Topics  . Smoking status: Never Smoker   . Smokeless tobacco: Never Used  . Alcohol Use: No  . Drug Use: No  . Sexual Activity: Not on file   Other Topics Concern  . Not on file   Social History Narrative     Review of Systems: General: negative for chills, fever, night sweats or weight changes.  Cardiovascular:  negative for chest pain, dyspnea on exertion, edema, orthopnea, palpitations, paroxysmal nocturnal dyspnea or shortness of breath Dermatological: negative for rash Respiratory: negative for cough or wheezing Urologic: negative for hematuria Abdominal: negative for nausea, vomiting, diarrhea, bright red blood per rectum, melena, or hematemesis Neurologic: negative for visual changes, syncope, or dizziness All other systems reviewed and are otherwise negative except as noted above.    Blood pressure 102/64, pulse 60, height 5\' 2"  (1.575 m), weight 196 lb (88.905 kg).  General appearance: alert and no distress Neck: no adenopathy, no carotid bruit, no JVD, supple, symmetrical, trachea midline and thyroid not enlarged, symmetric, no tenderness/mass/nodules Lungs: clear to auscultation bilaterally Heart: regular rate and rhythm, S1, S2 normal, no murmur, click, rub or gallop Extremities: 2-3+ pitting edema bilaterally  EKG not performed today  ASSESSMENT AND PLAN:   HTN (hypertension) History of hypertension blood pressure measured at 102/64. She is on metoprolol and lisinopril. Continue current meds at current dosing  Pulmonary embolism, Bilateral History of large bilateral pulmonary emboli with DVT 08/07/12 with ultimate resolution of both by duplex and CT angiogram. She remains on Coumadin anticoagulation.  Acute on chronic combined systolic and diastolic congestive heart failure, NYHA class 3 ([place]) [place]  has a decline in her ejection fraction from 3 years ago from normal in 2014-35-40% by recent 2-D echo. A Myoview showed anteroapical scar. She was recently hospitalized with anemia and symptoms of heart failure required transfusion. Hemoglobin has remained stable. She was scheduled to undergo right and left heart cath on 07/06/15 which was whispering because of anemia requiring transfusion. Based on her stress test result and 2-D echo we will proceed with radial left heart cath to define  her anatomy and rule out ischemic etiology. I have reviewed the risks, indications, and alternatives to cardiac catheterization, possible angioplasty, and stenting with the patient. Risks include but are not limited to bleeding, infection, vascular injury, stroke, myocardial infection, arrhythmia, kidney injury, radiation-related injury in the case of prolonged fluoroscopy use, emergency cardiac surgery, and death. The patient understands the risks of serious complication is 1-2 in [id number] with diagnostic cardiac cath and 1-2% or less with angioplasty/stenting.       [name] MD FACP,FACC,FAHA, [facility] 08/19/2015 10:24 AM

## 2015-09-03 NOTE — Progress Notes (Signed)
TR BAND REMOVAL  LOCATION:    Right radial  DEFLATED PER PROTOCOL:    Yes.    TIME BAND OFF / DRESSING APPLIED:    1200p   SITE UPON ARRIVAL:    Level 0  SITE AFTER BAND REMOVAL:    Level 0  CIRCULATION SENSATION AND MOVEMENT:    Within Normal Limits   Yes.    COMMENTS:  Right radial level 0 the patient denies any discomfort at site  Care instructions given to pt and family.         They were able to verbalize understanding

## 2015-09-03 NOTE — Discharge Instructions (Signed)
Radial Site Care °Refer to this sheet in the next few weeks. These instructions provide you with information about caring for yourself after your procedure. Your health care provider may also give you more specific instructions. Your treatment has been planned according to current medical practices, but problems sometimes occur. Call your health care provider if you have any problems or questions after your procedure. °WHAT TO EXPECT AFTER THE PROCEDURE °After your procedure, it is typical to have the following: °· Bruising at the radial site that usually fades within 1-2 weeks. °· Blood collecting in the tissue (hematoma) that may be painful to the touch. It should usually decrease in size and tenderness within 1-2 weeks. °HOME CARE INSTRUCTIONS °· Take medicines only as directed by your health care provider. °· You may shower 24-48 hours after the procedure or as directed by your health care provider. Remove the bandage (dressing) and gently wash the site with plain soap and water. Pat the area dry with a clean towel. Do not rub the site, because this may cause bleeding. °· Do not take baths, swim, or use a hot tub until your health care provider approves. °· Check your insertion site every day for redness, swelling, or drainage. °· Do not apply powder or lotion to the site. °· Do not flex or bend the affected arm for 24 hours or as directed by your health care provider. °· Do not push or pull heavy objects with the affected arm for 24 hours or as directed by your health care provider. °· Do not lift over 10 lb (4.5 kg) for 5 days after your procedure or as directed by your health care provider. °· Ask your health care provider when it is okay to: °¨ Return to work or school. °¨ Resume usual physical activities or sports. °¨ Resume sexual activity. °· Do not drive home if you are discharged the same day as the procedure. Have someone else drive you. °· You may drive 24 hours after the procedure unless otherwise  instructed by your health care provider. °· Do not operate machinery or power tools for 24 hours after the procedure. °· If your procedure was done as an outpatient procedure, which means that you went home the same day as your procedure, a responsible adult should be with you for the first 24 hours after you arrive home. °· Keep all follow-up visits as directed by your health care provider. This is important. °SEEK MEDICAL CARE IF: °· You have a fever. °· You have chills. °· You have increased bleeding from the radial site. Hold pressure on the site. °SEEK IMMEDIATE MEDICAL CARE IF: °· You have unusual pain at the radial site. °· You have redness, warmth, or swelling at the radial site. °· You have drainage (other than a small amount of blood on the dressing) from the radial site. °· The radial site is bleeding, and the bleeding does not stop after 30 minutes of holding steady pressure on the site. °· Your arm or hand becomes pale, cool, tingly, or numb. °  °This information is not intended to replace advice given to you by your health care provider. Make sure you discuss any questions you have with your health care provider. °  °Document Released: 06/04/2010 Document Revised: 05/23/2014 Document Reviewed: 11/18/2013 °Elsevier Interactive Patient Education ©2016 [employer] ° °

## 2015-09-03 NOTE — Telephone Encounter (Signed)
Spoke with Nicole Gibbs at [place].  Patient will be scheduled for thyroid biopsy on Tuesday April 25.  Patient will start lovenox injections tomorrow, Friday April 21, post heart cath, then continue with them every 12 hours, last dose Monday April 24 in the morning.  Wednesday morning she can resume lovenox injections and re-start her warfarin that day, as was previously scheduled.  Will call patient and relay this information

## 2015-09-04 ENCOUNTER — Other Ambulatory Visit: Payer: Self-pay | Admitting: *Deleted

## 2015-09-04 ENCOUNTER — Ambulatory Visit: Payer: Medicare Other | Admitting: *Deleted

## 2015-09-04 ENCOUNTER — Encounter (HOSPITAL_COMMUNITY): Payer: Self-pay | Admitting: Cardiovascular Disease

## 2015-09-04 NOTE — Telephone Encounter (Signed)
Spoke with patient, will email her updated bridging information and patient to call if needs more syringes.    Enoxaprin Dosing Schedule  Enoxparin dose: 80 mg  Date  Warfarin Dose (evenings) Enoxaprin Dose   6     5    4-21 4 0 8 am   8 pm  4-22 3 0 8 am   8 pm  4-23 2 0 8 am   8 pm  4-24 1 0 8 am  4-25 Procedure 3 mg (1 tab)   4-26 1 4.5 mg (1.5 tabs) 8 am   8 pm  4-27 2 4.5 mg (1.5 tabs) 8 am   8 pm  4-28 3 4.5 mg (1.5 tabs) 8 am   8 pm  4-29 4 4.5 mg (1.5 tabs) 8 am   8 pm  4-30 5 4.5 mg (1.5 tabs)   5-1 6 Repeat INR 10:50 am

## 2015-09-04 NOTE — Patient Outreach (Signed)
[place] [facility]) Care Management  09/04/2015  Nicole Gibbs [birth year] [id number]   Assessment: Care coordination call Call placed and spoke with patient stating that she is "doing fine", "everything went well" and "everything was clear". Patient mentioned that she was discharged yesterday afternoon after procedure (Left Heart Cath and Coronary Angiography) was done in the morning.  Discharge instructions reinforced with patient such as checking insertion site every day for redness, swelling, or drainage  and notify physician if noted any. Patient reports bruising to right arm (site) and told patient that bruising and hematoma are expected and will usually fade with in 1-2 weeks. She is aware to prevent wetting her dressing. Reminded not to lift over 10 pounds for 5 days after the procedure and she may shower after 48 hours after procedure.   Patient denies of any active bleeding at this time. Encouraged patient to call [facility], care management coordinator or 24-hour nurse line as needed.    Plan: Will proceed with routine home visit as scheduled on 09/24/15.  [place] Haub A. Tahj Njoku, BSN, RN-BC [place] Management Coordinator Cell: [phone]

## 2015-09-08 ENCOUNTER — Other Ambulatory Visit (HOSPITAL_COMMUNITY)
Admission: RE | Admit: 2015-09-08 | Discharge: 2015-09-08 | Disposition: A | Discharge status: Home / Self Care | Payer: Medicare Other | Source: Ambulatory Visit | Attending: Radiology | Admitting: Radiology

## 2015-09-08 ENCOUNTER — Inpatient Hospital Stay: Admission: RE | Admit: 2015-09-08 | Payer: Medicare Other | Source: Ambulatory Visit

## 2015-09-08 ENCOUNTER — Ambulatory Visit
Admission: RE | Admit: 2015-09-08 | Discharge: 2015-09-08 | Disposition: A | Discharge status: Home / Self Care | Payer: Medicare Other | Source: Ambulatory Visit | Attending: Endocrinology | Admitting: Endocrinology

## 2015-09-08 ENCOUNTER — Encounter: Payer: Medicare Other | Admitting: Pharmacist Clinician (PhC)/ Clinical Pharmacy Specialist

## 2015-09-08 DIAGNOSIS — M170 Bilateral primary osteoarthritis of knee: Secondary | ICD-10-CM | POA: Diagnosis not present

## 2015-09-08 DIAGNOSIS — E041 Nontoxic single thyroid nodule: Secondary | ICD-10-CM | POA: Diagnosis not present

## 2015-09-08 DIAGNOSIS — I5043 Acute on chronic combined systolic (congestive) and diastolic (congestive) heart failure: Secondary | ICD-10-CM | POA: Diagnosis not present

## 2015-09-08 DIAGNOSIS — I110 Hypertensive heart disease with heart failure: Secondary | ICD-10-CM | POA: Diagnosis not present

## 2015-09-08 DIAGNOSIS — D649 Anemia, unspecified: Secondary | ICD-10-CM | POA: Diagnosis not present

## 2015-09-08 DIAGNOSIS — I82403 Acute embolism and thrombosis of unspecified deep veins of lower extremity, bilateral: Secondary | ICD-10-CM | POA: Diagnosis not present

## 2015-09-08 DIAGNOSIS — M6281 Muscle weakness (generalized): Secondary | ICD-10-CM | POA: Diagnosis not present

## 2015-09-09 DIAGNOSIS — I5043 Acute on chronic combined systolic (congestive) and diastolic (congestive) heart failure: Secondary | ICD-10-CM | POA: Diagnosis not present

## 2015-09-09 DIAGNOSIS — M6281 Muscle weakness (generalized): Secondary | ICD-10-CM | POA: Diagnosis not present

## 2015-09-09 DIAGNOSIS — M170 Bilateral primary osteoarthritis of knee: Secondary | ICD-10-CM | POA: Diagnosis not present

## 2015-09-09 DIAGNOSIS — D649 Anemia, unspecified: Secondary | ICD-10-CM | POA: Diagnosis not present

## 2015-09-09 DIAGNOSIS — I82403 Acute embolism and thrombosis of unspecified deep veins of lower extremity, bilateral: Secondary | ICD-10-CM | POA: Diagnosis not present

## 2015-09-09 DIAGNOSIS — I110 Hypertensive heart disease with heart failure: Secondary | ICD-10-CM | POA: Diagnosis not present

## 2015-09-11 DIAGNOSIS — I5043 Acute on chronic combined systolic (congestive) and diastolic (congestive) heart failure: Secondary | ICD-10-CM | POA: Diagnosis not present

## 2015-09-11 DIAGNOSIS — M170 Bilateral primary osteoarthritis of knee: Secondary | ICD-10-CM | POA: Diagnosis not present

## 2015-09-11 DIAGNOSIS — D649 Anemia, unspecified: Secondary | ICD-10-CM | POA: Diagnosis not present

## 2015-09-11 DIAGNOSIS — I82403 Acute embolism and thrombosis of unspecified deep veins of lower extremity, bilateral: Secondary | ICD-10-CM | POA: Diagnosis not present

## 2015-09-11 DIAGNOSIS — M6281 Muscle weakness (generalized): Secondary | ICD-10-CM | POA: Diagnosis not present

## 2015-09-11 DIAGNOSIS — I110 Hypertensive heart disease with heart failure: Secondary | ICD-10-CM | POA: Diagnosis not present

## 2015-09-11 DIAGNOSIS — Z7901 Long term (current) use of anticoagulants: Secondary | ICD-10-CM | POA: Diagnosis not present

## 2015-09-11 DIAGNOSIS — E669 Obesity, unspecified: Secondary | ICD-10-CM | POA: Diagnosis not present

## 2015-09-11 DIAGNOSIS — Z86711 Personal history of pulmonary embolism: Secondary | ICD-10-CM | POA: Diagnosis not present

## 2015-09-14 ENCOUNTER — Ambulatory Visit (INDEPENDENT_AMBULATORY_CARE_PROVIDER_SITE_OTHER): Payer: Medicare Other | Admitting: Pharmacist

## 2015-09-14 DIAGNOSIS — Z7901 Long term (current) use of anticoagulants: Secondary | ICD-10-CM

## 2015-09-14 DIAGNOSIS — I2699 Other pulmonary embolism without acute cor pulmonale: Secondary | ICD-10-CM

## 2015-09-14 DIAGNOSIS — I82403 Acute embolism and thrombosis of unspecified deep veins of lower extremity, bilateral: Secondary | ICD-10-CM

## 2015-09-14 DIAGNOSIS — I2601 Septic pulmonary embolism with acute cor pulmonale: Secondary | ICD-10-CM

## 2015-09-14 LAB — POCT INR: INR: 1.7

## 2015-09-15 DIAGNOSIS — M170 Bilateral primary osteoarthritis of knee: Secondary | ICD-10-CM | POA: Diagnosis not present

## 2015-09-15 DIAGNOSIS — I5043 Acute on chronic combined systolic (congestive) and diastolic (congestive) heart failure: Secondary | ICD-10-CM | POA: Diagnosis not present

## 2015-09-15 DIAGNOSIS — I110 Hypertensive heart disease with heart failure: Secondary | ICD-10-CM | POA: Diagnosis not present

## 2015-09-15 DIAGNOSIS — D649 Anemia, unspecified: Secondary | ICD-10-CM | POA: Diagnosis not present

## 2015-09-15 DIAGNOSIS — M6281 Muscle weakness (generalized): Secondary | ICD-10-CM | POA: Diagnosis not present

## 2015-09-15 DIAGNOSIS — I82403 Acute embolism and thrombosis of unspecified deep veins of lower extremity, bilateral: Secondary | ICD-10-CM | POA: Diagnosis not present

## 2015-09-17 DIAGNOSIS — I110 Hypertensive heart disease with heart failure: Secondary | ICD-10-CM | POA: Diagnosis not present

## 2015-09-17 DIAGNOSIS — I5043 Acute on chronic combined systolic (congestive) and diastolic (congestive) heart failure: Secondary | ICD-10-CM | POA: Diagnosis not present

## 2015-09-17 DIAGNOSIS — D649 Anemia, unspecified: Secondary | ICD-10-CM | POA: Diagnosis not present

## 2015-09-17 DIAGNOSIS — I82403 Acute embolism and thrombosis of unspecified deep veins of lower extremity, bilateral: Secondary | ICD-10-CM | POA: Diagnosis not present

## 2015-09-17 DIAGNOSIS — M6281 Muscle weakness (generalized): Secondary | ICD-10-CM | POA: Diagnosis not present

## 2015-09-17 DIAGNOSIS — M170 Bilateral primary osteoarthritis of knee: Secondary | ICD-10-CM | POA: Diagnosis not present

## 2015-09-21 ENCOUNTER — Ambulatory Visit (INDEPENDENT_AMBULATORY_CARE_PROVIDER_SITE_OTHER): Payer: Medicare Other | Admitting: Pharmacist Clinician (PhC)/ Clinical Pharmacy Specialist

## 2015-09-21 DIAGNOSIS — I82403 Acute embolism and thrombosis of unspecified deep veins of lower extremity, bilateral: Secondary | ICD-10-CM | POA: Diagnosis not present

## 2015-09-21 DIAGNOSIS — I2601 Septic pulmonary embolism with acute cor pulmonale: Secondary | ICD-10-CM

## 2015-09-21 DIAGNOSIS — I2699 Other pulmonary embolism without acute cor pulmonale: Secondary | ICD-10-CM | POA: Diagnosis not present

## 2015-09-21 DIAGNOSIS — Z7901 Long term (current) use of anticoagulants: Secondary | ICD-10-CM

## 2015-09-21 DIAGNOSIS — I2782 Chronic pulmonary embolism: Secondary | ICD-10-CM

## 2015-09-21 LAB — POCT INR: INR: 2.3

## 2015-09-22 DIAGNOSIS — D649 Anemia, unspecified: Secondary | ICD-10-CM | POA: Diagnosis not present

## 2015-09-22 DIAGNOSIS — I82403 Acute embolism and thrombosis of unspecified deep veins of lower extremity, bilateral: Secondary | ICD-10-CM | POA: Diagnosis not present

## 2015-09-22 DIAGNOSIS — M6281 Muscle weakness (generalized): Secondary | ICD-10-CM | POA: Diagnosis not present

## 2015-09-22 DIAGNOSIS — I5043 Acute on chronic combined systolic (congestive) and diastolic (congestive) heart failure: Secondary | ICD-10-CM | POA: Diagnosis not present

## 2015-09-22 DIAGNOSIS — I110 Hypertensive heart disease with heart failure: Secondary | ICD-10-CM | POA: Diagnosis not present

## 2015-09-22 DIAGNOSIS — M170 Bilateral primary osteoarthritis of knee: Secondary | ICD-10-CM | POA: Diagnosis not present

## 2015-09-24 ENCOUNTER — Other Ambulatory Visit: Payer: Self-pay | Admitting: *Deleted

## 2015-09-24 ENCOUNTER — Encounter: Payer: Self-pay | Admitting: *Deleted

## 2015-09-24 VITALS — BP 124/80 | HR 72 | Resp 16

## 2015-09-24 DIAGNOSIS — I110 Hypertensive heart disease with heart failure: Secondary | ICD-10-CM | POA: Diagnosis not present

## 2015-09-24 DIAGNOSIS — I5043 Acute on chronic combined systolic (congestive) and diastolic (congestive) heart failure: Secondary | ICD-10-CM | POA: Diagnosis not present

## 2015-09-24 DIAGNOSIS — I5042 Chronic combined systolic (congestive) and diastolic (congestive) heart failure: Secondary | ICD-10-CM

## 2015-09-24 DIAGNOSIS — M6281 Muscle weakness (generalized): Secondary | ICD-10-CM | POA: Diagnosis not present

## 2015-09-24 DIAGNOSIS — D649 Anemia, unspecified: Secondary | ICD-10-CM | POA: Diagnosis not present

## 2015-09-24 DIAGNOSIS — I82403 Acute embolism and thrombosis of unspecified deep veins of lower extremity, bilateral: Secondary | ICD-10-CM | POA: Diagnosis not present

## 2015-09-24 DIAGNOSIS — M170 Bilateral primary osteoarthritis of knee: Secondary | ICD-10-CM | POA: Diagnosis not present

## 2015-09-24 NOTE — Patient Outreach (Addendum)
[facility]) Care Management   09/24/2015  Nicole Gibbs 11/08/1945 [id number]  Nicole Gibbs is an 70 y.o. female  Subjective: Patient states "feeling well" and "no pain". Had a "busy day since home health nurse was here" and "physical therapy walked with me outside and did some exercises". She stated using cane for support due to uneven pavement outside and to keep gait steady per patient  Objective:  BP 124/80 mmHg  Pulse 72  Resp 16  SpO2 96%  Review of Systems  Constitutional: Negative.   HENT: Negative.   Eyes: Negative.   Respiratory: Negative for cough, sputum production, shortness of breath and wheezing.        Respirations even and unlabored Clear to auscultation  Cardiovascular: Positive for leg swelling. Negative for chest pain.       Occasional irregular heart beats Swelling to bilateral lower extremities- not as tight as previous   Gastrointestinal: Negative for nausea, vomiting, abdominal pain, diarrhea and constipation.       Bowel sounds positive Abdomen soft and non-tender Obese  Genitourinary: Positive for frequency.       Frequency related to Lasix use  Musculoskeletal: Positive for falls. Negative for back pain, joint pain and neck pain.       History of fall  Skin: Positive for rash. Negative for itching.       Right breast fold and abdominal fold rashes- uses Nystatin powder or cornstarch  Neurological: Negative.   Endo/Heme/Allergies: Negative.   Psychiatric/Behavioral: Negative.     Physical Exam  Encounter Medications:   Outpatient Encounter Prescriptions as of 09/24/2015  Medication Sig Note  . acetaminophen (TYLENOL) 325 MG tablet Take 650 mg by mouth every 6 (six) hours as needed for moderate pain.   . ferrous sulfate 325 (65 FE) MG tablet Take 1 tablet (325 mg total) by mouth 2 (two) times daily with a meal.   . furosemide (LASIX) 40 MG tablet Take 1 tablet (40 mg total) by mouth 2 (two) times daily.   . lisinopril  (PRINIVIL,ZESTRIL) 2.5 MG tablet Take 1 tablet (2.5 mg total) by mouth daily.   . metoprolol (LOPRESSOR) 50 MG tablet Take 50 mg by mouth 2 (two) times daily.    . Multiple Vitamin (MULTIVITAMIN) tablet Take 1 tablet by mouth daily.   . nitroGLYCERIN (NITROSTAT) 0.4 MG SL tablet Place 1 tablet (0.4 mg total) under the tongue every 5 (five) minutes as needed for chest pain.   . nystatin (MYCOSTATIN/NYSTOP) 100000 UNIT/GM POWD Apply to your groin TID   . pantoprazole (PROTONIX) 40 MG tablet Take 1 tablet (40 mg total) by mouth 2 (two) times daily. (Patient taking differently: Take 40 mg by mouth daily. ) 09/24/2015: Patient taking medication twice a day as ordered  . potassium chloride SA (K-DUR,KLOR-CON) 20 MEQ tablet Take 2 tablets (40 mEq total) by mouth daily. (Patient taking differently: Take 20 mEq by mouth 2 (two) times daily. )   . warfarin (COUMADIN) 3 MG tablet TAKE 1 TO 1 AND 1/2 TABLETS BY MOUTH DAILY OR AS DIRECTED (Patient taking differently: takes 4.5mg only on sun, takes 3mg all other days) 09/24/2015: Patient taking 3 mg daily except Sunday- taking 4.5 mg  . zolpidem (AMBIEN) 5 MG tablet Take 1 tablet (5 mg total) by mouth at bedtime as needed for sleep. 07/29/2015: Medication ran out  . b complex vitamins capsule Take 1 capsule by mouth daily. (Patient not taking: Reported on 08/27/2015)   . enoxaparin (LOVENOX) 80 MG/0.8ML   [facility] Care Management   09/24/2015  Nicole Gibbs 11/08/1945 [id number]  Nicole Gibbs is an 70 y.o. female  Subjective: Patient states "feeling well" and "no pain". Had a "busy day since home health nurse was here" and "physical therapy walked with me outside and did some exercises". She stated using cane for support due to uneven pavement outside and to keep gait steady per patient  Objective:  BP 124/80 mmHg  Pulse 72  Resp 16  SpO2 96%  Review of Systems  Constitutional: Negative.   HENT: Negative.   Eyes: Negative.   Respiratory: Negative for cough, sputum production, shortness of breath and wheezing.        Respirations even and unlabored Clear to auscultation  Cardiovascular: Positive for leg swelling. Negative for chest pain.       Occasional irregular heart beats Swelling to bilateral lower extremities- not as tight as previous   Gastrointestinal: Negative for nausea, vomiting, abdominal pain, diarrhea and constipation.       Bowel sounds positive Abdomen soft and non-tender Obese  Genitourinary: Positive for frequency.       Frequency related to Lasix use  Musculoskeletal: Positive for falls. Negative for back pain, joint pain and neck pain.       History of fall  Skin: Positive for rash. Negative for itching.       Right breast fold and abdominal fold rashes- uses Nystatin powder or cornstarch  Neurological: Negative.   Endo/Heme/Allergies: Negative.   Psychiatric/Behavioral: Negative.     Physical Exam  Encounter Medications:   Outpatient Encounter Prescriptions as of 09/24/2015  Medication Sig Note  . acetaminophen (TYLENOL) 325 MG tablet Take 650 mg by mouth every 6 (six) hours as needed for moderate pain.   . ferrous sulfate 325 (65 FE) MG tablet Take 1 tablet (325 mg total) by mouth 2 (two) times daily with a meal.   . furosemide (LASIX) 40 MG tablet Take 1 tablet (40 mg total) by mouth 2 (two) times daily.   . lisinopril  (PRINIVIL,ZESTRIL) 2.5 MG tablet Take 1 tablet (2.5 mg total) by mouth daily.   . metoprolol (LOPRESSOR) 50 MG tablet Take 50 mg by mouth 2 (two) times daily.    . Multiple Vitamin (MULTIVITAMIN) tablet Take 1 tablet by mouth daily.   . nitroGLYCERIN (NITROSTAT) 0.4 MG SL tablet Place 1 tablet (0.4 mg total) under the tongue every 5 (five) minutes as needed for chest pain.   . nystatin (MYCOSTATIN/NYSTOP) 100000 UNIT/GM POWD Apply to your groin TID   . pantoprazole (PROTONIX) 40 MG tablet Take 1 tablet (40 mg total) by mouth 2 (two) times daily. (Patient taking differently: Take 40 mg by mouth daily. ) 09/24/2015: Patient taking medication twice a day as ordered  . potassium chloride SA (K-DUR,KLOR-CON) 20 MEQ tablet Take 2 tablets (40 mEq total) by mouth daily. (Patient taking differently: Take 20 mEq by mouth 2 (two) times daily. )   . warfarin (COUMADIN) 3 MG tablet TAKE 1 TO 1 AND 1/2 TABLETS BY MOUTH DAILY OR AS DIRECTED (Patient taking differently: takes 4.5mg only on sun, takes 3mg all other days) 09/24/2015: Patient taking 3 mg daily except Sunday- taking 4.5 mg  . zolpidem (AMBIEN) 5 MG tablet Take 1 tablet (5 mg total) by mouth at bedtime as needed for sleep. 07/29/2015: Medication ran out  . b complex vitamins capsule Take 1 capsule by mouth daily. (Patient not taking: Reported on 08/27/2015)   . enoxaparin (LOVENOX) 80 MG/0.8ML   [place] [facility]) Care Management   09/24/2015  Clarita Cahalan [birth year] [id number]  Nicole Gibbs is an 70 y.o. female  Subjective: Patient states "feeling well" and "no pain". Had a "busy day since home health nurse was here" and "physical therapy walked with me outside and did some exercises". She stated using cane for support due to uneven pavement outside and to keep gait steady per patient  Objective:  BP 124/80 mmHg  Pulse 72  Resp 16  SpO2 96%  Review of Systems  Constitutional: Negative.   HENT: Negative.   Eyes: Negative.   Respiratory: Negative for cough, sputum production, shortness of breath and wheezing.        Respirations even and unlabored Clear to auscultation  Cardiovascular: Positive for leg swelling. Negative for chest pain.       Occasional irregular heart beats Swelling to bilateral lower extremities- not as tight as previous   Gastrointestinal: Negative for nausea, vomiting, abdominal pain, diarrhea and constipation.       Bowel sounds positive Abdomen soft and non-tender Obese  Genitourinary: Positive for frequency.       Frequency related to Lasix use  Musculoskeletal: Positive for falls. Negative for back pain, joint pain and neck pain.       History of fall  Skin: Positive for rash. Negative for itching.       Right breast fold and abdominal fold rashes- uses Nystatin powder or cornstarch  Neurological: Negative.   Endo/Heme/Allergies: Negative.   Psychiatric/Behavioral: Negative.     Physical Exam  Encounter Medications:   Outpatient Encounter Prescriptions as of 09/24/2015  Medication Sig Note  . acetaminophen (TYLENOL) 325 MG tablet Take 650 mg by mouth every 6 (six) hours as needed for moderate pain.   . ferrous sulfate 325 (65 FE) MG tablet Take 1 tablet (325 mg total) by mouth 2 (two) times daily with a meal.   . furosemide (LASIX) 40 MG tablet Take 1 tablet (40 mg total) by mouth 2 (two) times daily.   Marland Kitchen lisinopril  (PRINIVIL,ZESTRIL) 2.5 MG tablet Take 1 tablet (2.5 mg total) by mouth daily.   . metoprolol (LOPRESSOR) 50 MG tablet Take 50 mg by mouth 2 (two) times daily.    . Multiple Vitamin (MULTIVITAMIN) tablet Take 1 tablet by mouth daily.   . nitroGLYCERIN (NITROSTAT) 0.4 MG SL tablet Place 1 tablet (0.4 mg total) under the tongue every 5 (five) minutes as needed for chest pain.   Marland Kitchen nystatin (MYCOSTATIN/NYSTOP) 100000 UNIT/GM POWD Apply to your groin TID   . pantoprazole (PROTONIX) 40 MG tablet Take 1 tablet (40 mg total) by mouth 2 (two) times daily. (Patient taking differently: Take 40 mg by mouth daily. ) 09/24/2015: Patient taking medication twice a day as ordered  . potassium chloride SA (K-DUR,KLOR-CON) 20 MEQ tablet Take 2 tablets (40 mEq total) by mouth daily. (Patient taking differently: Take 20 mEq by mouth 2 (two) times daily. )   . warfarin (COUMADIN) 3 MG tablet TAKE 1 TO 1 AND 1/2 TABLETS BY MOUTH DAILY OR AS DIRECTED (Patient taking differently: takes 4.28m only on sun, takes 369mall other days) 09/24/2015: Patient taking 3 mg daily except Sunday- taking 4.5 mg  . zolpidem (AMBIEN) 5 MG tablet Take 1 tablet (5 mg total) by mouth at bedtime as needed for sleep. 07/29/2015: Medication ran out  . b complex vitamins capsule Take 1 capsule by mouth daily. (Patient not taking: Reported on 08/27/2015)   . enoxaparin (LOVENOX) 80 MG/0.8ML  [facility]) Care Management   09/24/2015  Clarita Cahalan [birth year] [id number]  Nicole Gibbs is an 70 y.o. female  Subjective: Patient states "feeling well" and "no pain". Had a "busy day since home health nurse was here" and "physical therapy walked with me outside and did some exercises". She stated using cane for support due to uneven pavement outside and to keep gait steady per patient  Objective:  BP 124/80 mmHg  Pulse 72  Resp 16  SpO2 96%  Review of Systems  Constitutional: Negative.   HENT: Negative.   Eyes: Negative.   Respiratory: Negative for cough, sputum production, shortness of breath and wheezing.        Respirations even and unlabored Clear to auscultation  Cardiovascular: Positive for leg swelling. Negative for chest pain.       Occasional irregular heart beats Swelling to bilateral lower extremities- not as tight as previous   Gastrointestinal: Negative for nausea, vomiting, abdominal pain, diarrhea and constipation.       Bowel sounds positive Abdomen soft and non-tender Obese  Genitourinary: Positive for frequency.       Frequency related to Lasix use  Musculoskeletal: Positive for falls. Negative for back pain, joint pain and neck pain.       History of fall  Skin: Positive for rash. Negative for itching.       Right breast fold and abdominal fold rashes- uses Nystatin powder or cornstarch  Neurological: Negative.   Endo/Heme/Allergies: Negative.   Psychiatric/Behavioral: Negative.     Physical Exam  Encounter Medications:   Outpatient Encounter Prescriptions as of 09/24/2015  Medication Sig Note  . acetaminophen (TYLENOL) 325 MG tablet Take 650 mg by mouth every 6 (six) hours as needed for moderate pain.   . ferrous sulfate 325 (65 FE) MG tablet Take 1 tablet (325 mg total) by mouth 2 (two) times daily with a meal.   . furosemide (LASIX) 40 MG tablet Take 1 tablet (40 mg total) by mouth 2 (two) times daily.   Marland Kitchen lisinopril  (PRINIVIL,ZESTRIL) 2.5 MG tablet Take 1 tablet (2.5 mg total) by mouth daily.   . metoprolol (LOPRESSOR) 50 MG tablet Take 50 mg by mouth 2 (two) times daily.    . Multiple Vitamin (MULTIVITAMIN) tablet Take 1 tablet by mouth daily.   . nitroGLYCERIN (NITROSTAT) 0.4 MG SL tablet Place 1 tablet (0.4 mg total) under the tongue every 5 (five) minutes as needed for chest pain.   Marland Kitchen nystatin (MYCOSTATIN/NYSTOP) 100000 UNIT/GM POWD Apply to your groin TID   . pantoprazole (PROTONIX) 40 MG tablet Take 1 tablet (40 mg total) by mouth 2 (two) times daily. (Patient taking differently: Take 40 mg by mouth daily. ) 09/24/2015: Patient taking medication twice a day as ordered  . potassium chloride SA (K-DUR,KLOR-CON) 20 MEQ tablet Take 2 tablets (40 mEq total) by mouth daily. (Patient taking differently: Take 20 mEq by mouth 2 (two) times daily. )   . warfarin (COUMADIN) 3 MG tablet TAKE 1 TO 1 AND 1/2 TABLETS BY MOUTH DAILY OR AS DIRECTED (Patient taking differently: takes 4.28m only on sun, takes 369mall other days) 09/24/2015: Patient taking 3 mg daily except Sunday- taking 4.5 mg  . zolpidem (AMBIEN) 5 MG tablet Take 1 tablet (5 mg total) by mouth at bedtime as needed for sleep. 07/29/2015: Medication ran out  . b complex vitamins capsule Take 1 capsule by mouth daily. (Patient not taking: Reported on 08/27/2015)   . enoxaparin (LOVENOX) 80 MG/0.8ML

## 2015-09-25 ENCOUNTER — Encounter: Payer: Self-pay | Admitting: *Deleted

## 2015-09-29 DIAGNOSIS — D649 Anemia, unspecified: Secondary | ICD-10-CM | POA: Diagnosis not present

## 2015-09-29 DIAGNOSIS — M170 Bilateral primary osteoarthritis of knee: Secondary | ICD-10-CM | POA: Diagnosis not present

## 2015-09-29 DIAGNOSIS — M6281 Muscle weakness (generalized): Secondary | ICD-10-CM | POA: Diagnosis not present

## 2015-09-29 DIAGNOSIS — I5043 Acute on chronic combined systolic (congestive) and diastolic (congestive) heart failure: Secondary | ICD-10-CM | POA: Diagnosis not present

## 2015-09-29 DIAGNOSIS — I110 Hypertensive heart disease with heart failure: Secondary | ICD-10-CM | POA: Diagnosis not present

## 2015-09-29 DIAGNOSIS — I82403 Acute embolism and thrombosis of unspecified deep veins of lower extremity, bilateral: Secondary | ICD-10-CM | POA: Diagnosis not present

## 2015-10-01 ENCOUNTER — Other Ambulatory Visit: Payer: Self-pay

## 2015-10-01 DIAGNOSIS — I110 Hypertensive heart disease with heart failure: Secondary | ICD-10-CM | POA: Diagnosis not present

## 2015-10-01 DIAGNOSIS — M170 Bilateral primary osteoarthritis of knee: Secondary | ICD-10-CM | POA: Diagnosis not present

## 2015-10-01 DIAGNOSIS — D649 Anemia, unspecified: Secondary | ICD-10-CM | POA: Diagnosis not present

## 2015-10-01 DIAGNOSIS — M6281 Muscle weakness (generalized): Secondary | ICD-10-CM | POA: Diagnosis not present

## 2015-10-01 DIAGNOSIS — I82403 Acute embolism and thrombosis of unspecified deep veins of lower extremity, bilateral: Secondary | ICD-10-CM | POA: Diagnosis not present

## 2015-10-01 DIAGNOSIS — I5043 Acute on chronic combined systolic (congestive) and diastolic (congestive) heart failure: Secondary | ICD-10-CM | POA: Diagnosis not present

## 2015-10-01 NOTE — Patient Outreach (Signed)
[place] [facility]) Care Management  10/01/2015  Nicole Gibbs [birth year] [id number]   Telephone call to patient for introductory call.  Patient reports she is doing good.  Explained health coach role and advised that health coach would call once a month.  She verbalized understanding.   Plan: RN Health Coach will contact patient within one month and patient agrees to next outreach.    Jone Baseman, RN, MSN [place] [phone]

## 2015-10-02 ENCOUNTER — Ambulatory Visit (INDEPENDENT_AMBULATORY_CARE_PROVIDER_SITE_OTHER): Payer: Medicare Other | Admitting: Pharmacist

## 2015-10-02 ENCOUNTER — Encounter: Payer: Self-pay | Admitting: Cardiovascular Disease

## 2015-10-02 ENCOUNTER — Ambulatory Visit (INDEPENDENT_AMBULATORY_CARE_PROVIDER_SITE_OTHER): Payer: Medicare Other | Admitting: Cardiovascular Disease

## 2015-10-02 VITALS — BP 120/78 | HR 64 | Ht 62.0 in | Wt 195.6 lb

## 2015-10-02 DIAGNOSIS — I2699 Other pulmonary embolism without acute cor pulmonale: Secondary | ICD-10-CM

## 2015-10-02 DIAGNOSIS — I82403 Acute embolism and thrombosis of unspecified deep veins of lower extremity, bilateral: Secondary | ICD-10-CM | POA: Diagnosis not present

## 2015-10-02 DIAGNOSIS — R9439 Abnormal result of other cardiovascular function study: Secondary | ICD-10-CM | POA: Diagnosis not present

## 2015-10-02 DIAGNOSIS — I5042 Chronic combined systolic (congestive) and diastolic (congestive) heart failure: Secondary | ICD-10-CM

## 2015-10-02 DIAGNOSIS — I2782 Chronic pulmonary embolism: Secondary | ICD-10-CM

## 2015-10-02 DIAGNOSIS — Z7901 Long term (current) use of anticoagulants: Secondary | ICD-10-CM

## 2015-10-02 DIAGNOSIS — I2601 Septic pulmonary embolism with acute cor pulmonale: Secondary | ICD-10-CM

## 2015-10-02 LAB — POCT INR: INR: 2.3

## 2015-10-02 NOTE — Assessment & Plan Note (Signed)
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

## 2015-10-02 NOTE — Assessment & Plan Note (Signed)
History of chronic combined systolic and diastolic heart failure and recent recent cardiac catheter catheterization performed 09/03/15 because of unexplained decline in her EF revealing normalbcoronary arteries with an ejection fraction of 35-45%. He has a nonischemic cardiomyopathy.

## 2015-10-02 NOTE — Assessment & Plan Note (Addendum)
History of hypertension blood pressure measured at 120/78. She is on metoprolol and lisinopril. Continue current meds at current dosing

## 2015-10-02 NOTE — Assessment & Plan Note (Signed)
false positive

## 2015-10-02 NOTE — Patient Instructions (Signed)
Your physician recommends that you continue on your current medications as directed. Please refer to the Current Medication list given to you today.  Dr [name] recommends that you schedule a follow-up appointment in 1 year. You will receive a reminder letter in the mail two months in advance. If you don't receive a letter, please call our office to schedule the follow-up appointment.  If you need a refill on your cardiac medications before your next appointment, please call your pharmacy.

## 2015-10-02 NOTE — Progress Notes (Signed)
10/02/2015 Nicole Gibbs   02/19/1946  [id number]  Primary Physician [name], MD Primary Cardiologist: [name] MD [name]   HPI:  The patient returns today for followup after her recent outpatient cardiac catheterization performed 09/03/15.. She is a 70 year old severely overweight, married Caucasian female, mother of one child, who I initially saw because of preop clearance and shortness of breath on August 02, 2012..She ended up having bilateral DVTs and multiple pulmonary emboli and was admitted and placed on Lovenox and Coumadin. She had normal LV systolic function and mild RV dysfunction. Ultimately, because of vaginal bleeding, she underwent a D&C with endometrial ablation by Dr. Helane Rima April 22, [id number], without complication. She is no longer bleeding, and her shortness of breath has improved. Her Coumadin is followed here in our office. A followup CT angiogram revealed complete resolution of her pulmonary emboli but she did have an incidentally noted multinodular goiter. Follow-up lower extremity venous Doppler study showed resolution of her DVT. She does complain of progressive dyspnea which is somewhat limiting. She has been admitted to [facility] in December and again in February with anemia and heart failure. A recent Myoview stress test showed anteroapical scar and a 2-D echo revealed an ejection fraction of 35-40% which represents this is a significant decline compared to her EF 3 years ago which was normal.. She has had a negative upper and lower endoscopy with plans to perform pill endoscopy as well. Her hemoglobin has recently remained stable. I performed outpatient cardiac catheterization on her 09/03/15 revealing normal coronary arteries with ejection fraction 35-445% consistent with a nonischemic cardio myopathy.   Current Outpatient Prescriptions  Medication Sig Dispense Refill  . acetaminophen (TYLENOL) 325 MG tablet Take 650 mg by mouth every 6 (six)  hours as needed for moderate pain.    Marland Kitchen b complex vitamins capsule Take 1 capsule by mouth daily. (Patient not taking: Reported on 08/27/2015) 30 capsule 1  . ferrous sulfate 325 (65 FE) MG tablet Take 1 tablet (325 mg total) by mouth 2 (two) times daily with a meal. 60 tablet 0  . furosemide (LASIX) 40 MG tablet Take 1 tablet (40 mg total) by mouth 2 (two) times daily. 180 tablet 3  . lisinopril (PRINIVIL,ZESTRIL) 2.5 MG tablet Take 1 tablet (2.5 mg total) by mouth daily. 30 tablet 6  . metoprolol (LOPRESSOR) 50 MG tablet Take 50 mg by mouth 2 (two) times daily.     . Multiple Vitamin (MULTIVITAMIN) tablet Take 1 tablet by mouth daily.    . nitroGLYCERIN (NITROSTAT) 0.4 MG SL tablet Place 1 tablet (0.4 mg total) under the tongue every 5 (five) minutes as needed for chest pain. 25 tablet 3  . nystatin (MYCOSTATIN/NYSTOP) 100000 UNIT/GM POWD Apply to your groin TID 15 g 0  . pantoprazole (PROTONIX) 40 MG tablet Take 1 tablet (40 mg total) by mouth 2 (two) times daily. (Patient taking differently: Take 40 mg by mouth daily. ) 60 tablet 0  . potassium chloride SA (K-DUR,KLOR-CON) 20 MEQ tablet Take 2 tablets (40 mEq total) by mouth daily. (Patient taking differently: Take 20 mEq by mouth 2 (two) times daily. ) 30 tablet 0  . warfarin (COUMADIN) 3 MG tablet TAKE 1 TO 1 AND 1/2 TABLETS BY MOUTH DAILY OR AS DIRECTED (Patient taking differently: takes 4.5mg  only on sun, takes 3mg  all other days) 120 tablet 1  . zolpidem (AMBIEN) 5 MG tablet Take 1 tablet (5 mg total) by mouth at bedtime as needed  for sleep. 30 tablet 0   No current facility-administered medications for this visit.    Allergies  Allergen Reactions  . Keflex [Cephalexin] Diarrhea  . Sulfa Antibiotics Hives    Social History   Social History  . Marital Status: Married    Spouse Name: N/A  . Number of Children: N/A  . Years of Education: N/A   Occupational History  . Not on file.   Social History Main Topics  . Smoking status:  Never Smoker   . Smokeless tobacco: Never Used  . Alcohol Use: No  . Drug Use: No  . Sexual Activity: Not on file   Other Topics Concern  . Not on file   Social History Narrative     Review of Systems: General: negative for chills, fever, night sweats or weight changes.  Cardiovascular: negative for chest pain, dyspnea on exertion, edema, orthopnea, palpitations, paroxysmal nocturnal dyspnea or shortness of breath Dermatological: negative for rash Respiratory: negative for cough or wheezing Urologic: negative for hematuria Abdominal: negative for nausea, vomiting, diarrhea, bright red blood per rectum, melena, or hematemesis Neurologic: negative for visual changes, syncope, or dizziness All other systems reviewed and are otherwise negative except as noted above.    Blood pressure 120/78, pulse 64, height 5\' 2"  (1.575 m), weight 195 lb 9.6 oz (88.724 kg).  General appearance: alert and no distress Neck: no adenopathy, no carotid bruit, no JVD, supple, symmetrical, trachea midline and thyroid not enlarged, symmetric, no tenderness/mass/nodules Lungs: clear to auscultation bilaterally Heart: regular rate and rhythm, S1, S2 normal, no murmur, click, rub or gallop Extremities: extremities normal, atraumatic, no cyanosis or edema  EKG ot performed today  ASSESSMENT AND PLAN:   HTN (hypertension) History of hypertension blood pressure measured at 120/78. She is on metoprolol and lisinopril. 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 He has a nonischemic  cardiomyopathy.  Abnormal stress test false positive      [name] MD [facility], [facility] 10/02/2015 2:53 PM

## 2015-10-06 ENCOUNTER — Ambulatory Visit (HOSPITAL_BASED_OUTPATIENT_CLINIC_OR_DEPARTMENT_OTHER): Payer: Medicare Other

## 2015-10-06 ENCOUNTER — Ambulatory Visit (HOSPITAL_BASED_OUTPATIENT_CLINIC_OR_DEPARTMENT_OTHER): Payer: Medicare Other | Admitting: Hematology

## 2015-10-06 ENCOUNTER — Telehealth: Payer: Self-pay | Admitting: Hematology

## 2015-10-06 ENCOUNTER — Encounter: Payer: Self-pay | Admitting: Hematology

## 2015-10-06 VITALS — BP 145/93 | HR 80 | Resp 18 | Wt 200.2 lb

## 2015-10-06 DIAGNOSIS — Z7901 Long term (current) use of anticoagulants: Secondary | ICD-10-CM

## 2015-10-06 DIAGNOSIS — Z86718 Personal history of other venous thrombosis and embolism: Secondary | ICD-10-CM | POA: Diagnosis not present

## 2015-10-06 DIAGNOSIS — D473 Essential (hemorrhagic) thrombocythemia: Secondary | ICD-10-CM

## 2015-10-06 DIAGNOSIS — D500 Iron deficiency anemia secondary to blood loss (chronic): Secondary | ICD-10-CM

## 2015-10-06 DIAGNOSIS — D62 Acute posthemorrhagic anemia: Secondary | ICD-10-CM

## 2015-10-06 DIAGNOSIS — D75839 Thrombocytosis, unspecified: Secondary | ICD-10-CM

## 2015-10-06 DIAGNOSIS — D649 Anemia, unspecified: Secondary | ICD-10-CM

## 2015-10-06 LAB — COMPREHENSIVE METABOLIC PANEL
ALT: 27 U/L (ref 0–55)
AST: 31 U/L (ref 5–34)
Albumin: 3.6 g/dL (ref 3.5–5.0)
Alkaline Phosphatase: 113 U/L (ref 40–150)
Anion Gap: 8 mEq/L (ref 3–11)
BILIRUBIN TOTAL: 0.63 mg/dL (ref 0.20–1.20)
BUN: 15.8 mg/dL (ref 7.0–26.0)
CALCIUM: 8.9 mg/dL (ref 8.4–10.4)
CHLORIDE: 109 meq/L (ref 98–109)
CO2: 26 meq/L (ref 22–29)
CREATININE: 0.7 mg/dL (ref 0.6–1.1)
EGFR: 85 mL/min/{1.73_m2} — ABNORMAL LOW (ref >90)
Glucose: 85 mg/dl (ref 70–140)
Potassium: 4.1 mEq/L (ref 3.5–5.1)
Sodium: 143 mEq/L (ref 136–145)
TOTAL PROTEIN: 7 g/dL (ref 6.4–8.3)

## 2015-10-06 LAB — CBC & DIFF AND RETIC
BASO%: 0.5 % (ref 0.0–2.0)
BASOS ABS: 0 10*3/uL (ref 0.0–0.1)
EOS ABS: 0.1 10*3/uL (ref 0.0–0.5)
EOS%: 1.6 % (ref 0.0–7.0)
HEMATOCRIT: 42.2 % (ref 34.8–46.6)
HGB: 13.5 g/dL (ref 11.6–15.9)
IMMATURE RETIC FRACT: 3.2 % (ref 1.60–10.00)
LYMPH#: 1.5 10*3/uL (ref 0.9–3.3)
LYMPH%: 24.3 % (ref 14.0–49.7)
MCH: 28.5 pg (ref 25.1–34.0)
MCHC: 32 g/dL (ref 31.5–36.0)
MCV: 89 fL (ref 79.5–101.0)
MONO#: 0.7 10*3/uL (ref 0.1–0.9)
MONO%: 10.6 % (ref 0.0–14.0)
NEUT#: 3.9 10*3/uL (ref 1.5–6.5)
NEUT%: 63 % (ref 38.4–76.8)
Platelets: 273 10*3/uL (ref 145–400)
RBC: 4.74 10*6/uL (ref 3.70–5.45)
RDW: 14.7 % — AB (ref 11.2–14.5)
RETIC %: 0.86 % (ref 0.70–2.10)
RETIC CT ABS: 40.76 10*3/uL (ref 33.70–90.70)
WBC: 6.1 10*3/uL (ref 3.9–10.3)

## 2015-10-06 LAB — IRON AND TIBC
%SAT: 23 % (ref 21–57)
Iron: 56 ug/dL (ref 41–142)
TIBC: 244 ug/dL (ref 236–444)
UIBC: 188 ug/dL (ref 120–384)

## 2015-10-06 LAB — FERRITIN: FERRITIN: 123 ng/mL (ref 9–269)

## 2015-10-06 NOTE — Telephone Encounter (Signed)
per po fto sch pt appt-gave pt copy of avs-sent back to lab °

## 2015-10-07 DIAGNOSIS — M6281 Muscle weakness (generalized): Secondary | ICD-10-CM | POA: Diagnosis not present

## 2015-10-07 DIAGNOSIS — I5043 Acute on chronic combined systolic (congestive) and diastolic (congestive) heart failure: Secondary | ICD-10-CM | POA: Diagnosis not present

## 2015-10-07 DIAGNOSIS — I110 Hypertensive heart disease with heart failure: Secondary | ICD-10-CM | POA: Diagnosis not present

## 2015-10-07 DIAGNOSIS — D649 Anemia, unspecified: Secondary | ICD-10-CM | POA: Diagnosis not present

## 2015-10-07 DIAGNOSIS — M170 Bilateral primary osteoarthritis of knee: Secondary | ICD-10-CM | POA: Diagnosis not present

## 2015-10-07 DIAGNOSIS — I82403 Acute embolism and thrombosis of unspecified deep veins of lower extremity, bilateral: Secondary | ICD-10-CM | POA: Diagnosis not present

## 2015-10-07 NOTE — Progress Notes (Signed)
Marland Kitchen    HEMATOLOGY/ONCOLOGY CONSULTATION NOTE  Date of Service: 10/06/2015   Patient Care Team: [name], MD as PCP - General (Internal Medicine) Jon Billings, RN as [place] Management  CHIEF COMPLAINTS/PURPOSE OF CONSULTATION:  Follow-up for anemia  HISTORY OF PRESENTING ILLNESS:   Nicole Gibbs is a wonderful 70 y.o. female who has been referred to [place] by Dr .[name], MD for evaluation and management of Anemia.  Patient has a history of hypertension, obesity, coronary disease with evidence of silent MI and myocardial scar on stress testing in 2014 and recently on A999333, chronic systolic and diastolic CHF and previous history of bilateral DVT and pulmonary embolism on chronic Coumadin therapy. Patient notes that she had issues with postmenopausal bleeding 3 years ago which was resolved with endometrial ablation.  Patient was admitted to the [facility] on 05/06/2015 after presenting to urgent care with shortness of breath with subsequent labs showing a hemoglobin of 6. She was noted to have melanotic stools. No abdominal pain or chest pain. INR was noted to be therapeutic. She was admitted to hospital for acute GI bleed and had an EGD and colonoscopy that did not show any ulcer source of bleeding. Her anticoagulation was resumed. She was placed on Protonix 40 mg twice a day. Patient received 4 units of PRBCs for her GI bleed and her hemoglobin was 9.2 on discharge.  Patient had been on Coumadin since 07/2012 for history of pulmonary embolism and b/l DVT. This apparently occurred in the setting of using medroxyprogesterone injections for postmenopausal bleeding however the exact circumstances of whether this event was provoked are unclear to me . As per the patient's report she was told that she would require lifelong anticoagulation as per her cardiologist Dr. [name].  Patient was readmitted to the hospital on 07/03/2015 at the instruction of her cardiologist  for hemoglobin of 7.1. Evaluation showed some symptomatic anemia thrombocytopenia and mild hypotension. She received 1 unit of PRBCs and was started on oral iron supplementation. She was to [facility] as an outpatient to get a capsule endoscopy.  Patient notes that she has tolerated her oral iron well. She notes solid black stools but no overt bleeding and no melena. Her hemoglobin levels had improved to 11.8 on 07/28/2015 with an MCV of 82.5. Hemoglobin today has improved to 12.4 with an MCV of 80.8 and normal ferritin levels. This suggests against additional bleeding. Patient continues to be on Coumadin at this time. Notes no other new focal symptoms. No fevers or chills/night sweats/weight loss.  INTERVAL HISTORY  Nicole Gibbs is here for her scheduled follow-up for interval evaluation of anemia. She continues to be on ferrous sulfate 1 tablet by mouth twice a day. Notes that her cardiac cath was clean and that her stressed this was false-positive and she is glad about this. She continues to follow with Dr. [name] who is her cardiologist. No other acute new symptoms. Continues to be on chronic Coumadin. She reports that she has been scheduled for capsule endoscopy with gastric oncology next month. No evidence of for GI bleeding nor melena no hematochezia no hemoptysis no epistaxis no hemoptysis.   PAST MEDICAL HISTORY:  Past Medical History  Diagnosis Date  . Thyroid nodule     no meds  . Hypertensive heart disease   . Pulmonary embolism, bilateral (HCC)     a. chronic coumadin.  . DVT, bilateral lower limbs ([place])   . Arthritis  knees  . Postmenopausal vaginal bleeding   . Rash     under breasts  . Nocturia   . Obese   . Chronic combined systolic and diastolic CHF (congestive heart failure) ([place])     a. 07/2012 Echo: EF 55-60%, Gr1 DD;  b. 06/2015 Echo: EF 35-40%, triv AI, Mod MR, mod dil LA, mildly reduced RV.  . H/O cardiovascular stress test     a. 07/2012 MV: fixed  mid-dist anterior and apical defect - scar vs attenuation, distal ant HK, no reversibility, EF 51%-->low risk.  . Normal coronary arteries     by cardiac catheterization 09/03/15  Coumadin on 3 yrs for PE/DVT Post menopausal bleeding 3 yrs s/p endometrial ablation.  SURGICAL HISTORY: Past Surgical History  Procedure Laterality Date  . Cesarean section  1992  . Hysteroscopy w/d&c  03-24-2009  . Dilation and curettage of uterus  06/20/2012    Procedure: DILATATION AND CURETTAGE;  Surgeon: [name], MD;  Location: [facility];  Service: Gynecology;  Laterality: N/A;  . Dilation and evacution  1988    missed ab  . Cardiovascular stress test  08-07-2012  DR HILTY/ DR BERRY    LOW RISK NUCLEAR STUDY/ NO ISCHEMIA/ FIXED MID TO DISTAL ANTERIOR AND APICAL DEFECT MAY REPRESENT SCAR VS BREAST ATTENUATION ARTIFACT/  MILD DISTAL ANTERIOR HYPOKINESIS/ EF 51%  . Transthoracic echocardiogram  08-08-2012    LVSF NORMAL/ EF A999333  GRADE I DIASTOLIC DYSFUNCTION/ MILD AV AND MV REGURG./  RVSF MILDLY REDUCED  . Dilitation & currettage/hystroscopy with thermachoice ablation N/A 09/04/2012    Procedure: DILATATION & CURETTAGE WITH THERMACHOICE ABLATION;  Surgeon: [name], MD;  Location: [facility];  Service: Gynecology;  Laterality: N/A;  . Esophagogastroduodenoscopy N/A 05/07/2015    Procedure: ESOPHAGOGASTRODUODENOSCOPY (EGD);  Surgeon: Laurence Spates, MD;  Location: [facility];  Service: Endoscopy;  Laterality: N/A;  . Colonoscopy N/A 05/08/2015    Procedure: COLONOSCOPY;  Surgeon: Laurence Spates, MD;  Location: [facility];  Service: Endoscopy;  Laterality: N/A;  . Cardiac catheterization N/A 09/03/2015    Procedure: Left Heart Cath and Coronary Angiography;  Surgeon: [name], MD;  Location: [facility];  Service: Cardiovascular;  Laterality: N/A;    SOCIAL HISTORY: Social History   Social History  . Marital Status: Married    Spouse Name: N/A  . Number of  Children: N/A  . Years of Education: N/A   Occupational History  . Not on file.   Social History Main Topics  . Smoking status: Never Smoker   . Smokeless tobacco: Never Used  . Alcohol Use: No  . Drug Use: No  . Sexual Activity: Not on file   Other Topics Concern  . Not on file   Social History Narrative  Retired Geographical information systems officer   FAMILY HISTORY: Family History  Problem Relation Age of Onset  . Coronary artery disease Father 56  . Stroke Father   . Sudden death Sister   . Hypertension Mother   . Mental illness Mother     anxiety    ALLERGIES:  is allergic to keflex and sulfa antibiotics.  MEDICATIONS:  Current Outpatient Prescriptions  Medication Sig Dispense Refill  . acetaminophen (TYLENOL) 325 MG tablet Take 650 mg by mouth every 6 (six) hours as needed for moderate pain.    Marland Kitchen b complex vitamins capsule Take 1 capsule by mouth daily. (Patient not taking: Reported on 08/27/2015) 30 capsule 1  . ferrous sulfate 325 (  65 FE) MG tablet Take 1 tablet (325 mg total) by mouth 2 (two) times daily with a meal. 60 tablet 0  . furosemide (LASIX) 40 MG tablet Take 1 tablet (40 mg total) by mouth 2 (two) times daily. 180 tablet 3  . lisinopril (PRINIVIL,ZESTRIL) 2.5 MG tablet Take 1 tablet (2.5 mg total) by mouth daily. 30 tablet 6  . metoprolol (LOPRESSOR) 50 MG tablet Take 50 mg by mouth 2 (two) times daily.     . Multiple Vitamin (MULTIVITAMIN) tablet Take 1 tablet by mouth daily.    . nitroGLYCERIN (NITROSTAT) 0.4 MG SL tablet Place 1 tablet (0.4 mg total) under the tongue every 5 (five) minutes as needed for chest pain. 25 tablet 3  . nystatin (MYCOSTATIN/NYSTOP) 100000 UNIT/GM POWD Apply to your groin TID 15 g 0  . potassium chloride SA (K-DUR,KLOR-CON) 20 MEQ tablet Take 2 tablets (40 mEq total) by mouth daily. (Patient taking differently: Take 20 mEq by mouth 2 (two) times daily. ) 30 tablet 0  . warfarin (COUMADIN) 3 MG tablet TAKE 1 TO 1 AND 1/2 TABLETS  BY MOUTH DAILY OR AS DIRECTED (Patient taking differently: takes 4.5mg  only on sun, takes 3mg  all other days) 120 tablet 1  . zolpidem (AMBIEN) 5 MG tablet Take 1 tablet (5 mg total) by mouth at bedtime as needed for sleep. 30 tablet 0   No current facility-administered medications for this visit.    REVIEW OF SYSTEMS:    10 Point review of Systems was done is negative except as noted above.  PHYSICAL EXAMINATION: ECOG PERFORMANCE STATUS: 2 - Symptomatic, <50% confined to bed  . Filed Vitals:   10/06/15 1036  BP: 145/93  Pulse: 80  Resp: 18   Filed Weights   10/06/15 1036  Weight: 200 lb 3.2 oz (90.81 kg)   .Body mass index is 36.61 kg/(m^2).  GENERAL:alert, in no acute distress and comfortable SKIN: No acute rashes EYES: normal, conjunctiva are pink and non-injected, sclera clear OROPHARYNX:no exudate, no erythema and lips, buccal mucosa, and tongue normal  NECK: supple, no JVD, thyroid normal size, non-tender, without nodularity LYMPH:  no palpable lymphadenopathy in the cervical, axillary or inguinal LUNGS: Minimal basal rales, distant breath sounds. HEART: regular rate & rhythm,  no murmurs and bilateral 1+ edema ABDOMEN: abdomen obese, soft, non-tender, normoactive bowel sounds  Musculoskeletal: no cyanosis of digits and no clubbing  PSYCH: alert & oriented x 3 with fluent speech NEURO: no focal motor/sensory deficits  LABORATORY DATA:  I have reviewed the data as listed  . CBC Latest Ref Rng 10/06/2015 09/01/2015 08/11/2015  WBC 3.9 - 10.3 10e3/uL 6.1 5.7 6.5  Hemoglobin 11.6 - 15.9 g/dL 13.5 13.0 12.4  Hematocrit 34.8 - 46.6 % 42.2 42.6 40.3  Platelets 145 - 400 10e3/uL 273 314 270    . CMP Latest Ref Rng 10/06/2015 09/01/2015 08/11/2015  Glucose 70 - 140 mg/dl 85 79 86  BUN 7.0 - 26.0 mg/dL 15.8 18 15.4  Creatinine 0.6 - 1.1 mg/dL 0.7 0.70 0.8  Sodium 136 - 145 mEq/L 143 143 143  Potassium 3.5 - 5.1 mEq/L 4.1 4.3 4.0  Chloride 98 - 110 mmol/L - 107 -  CO2  22 - 29 mEq/L 26 30 29   Calcium 8.4 - 10.4 mg/dL 8.9 9.2 9.4  Total Protein 6.4 - 8.3 g/dL 7.0 - 7.1  Total Bilirubin 0.20 - 1.20 mg/dL 0.63 - 0.65  Alkaline Phos 40 - 150 U/L 113 - 103  AST 5 - 34  U/L 31 - 25  ALT 0 - 55 U/L 27 - 20   . Lab Results  Component Value Date   IRON 56 10/06/2015   TIBC 244 10/06/2015   IRONPCTSAT 23 10/06/2015   (Iron and TIBC)  Lab Results  Component Value Date   FERRITIN 123 10/06/2015     EGD 04/2015: ENDOSCOPIC IMPRESSION: 1. Anemia. No obvious source seen on upper G.I. Endoscopy  Colonoscopy 04/2015: ENDOSCOPIC IMPRESSION: 1. Anemia and anticoagulant patient no obvious source of bleeding seen RECOMMENDATIONS: 1. anticoagulation can be resumed. 2. Recommend routine colonoscopy screening repeat procedure in 10 years and begin stool for FOB and 4 to 5 years    RADIOGRAPHIC STUDIES: I have personally reviewed the radiological images as listed and agreed with the findings in the report. US Thyroid Biopsy  09/08/2015  INDICATION: Indeterminate right thyroid nodule. EXAM: ULTRASOUND GUIDED FINE NEEDLE ASPIRATION OF INDETERMINATE RIGHT THYROID NODULE COMPARISON:  Prior ultrasound-guided thyroid biopsies Sep 20, 2013 and recent thyroid ultrasound April of 2017. MEDICATIONS: None COMPLICATIONS: None immediate. TECHNIQUE: Informed written consent was obtained from the patient after a discussion of the risks, benefits and alternatives to treatment. Questions regarding the procedure were encouraged and answered. A timeout was performed prior to the initiation of the procedure. Pre-procedural ultrasound scanning demonstrated right mid thyroid predominantly solid nodule seen on diagnostic thyroid ultrasound measuring approximately 3.1 x 2.2 x 4.0 cm. The procedure was planned. The neck was prepped in the usual sterile fashion, and a sterile drape was applied covering the operative field. A timeout was performed prior to the initiation of the procedure. Local  anesthesia was provided with 1% lidocaine. Under direct ultrasound guidance, 4 FNA biopsies were performed of the right thyroid nodule with a 25 gauge needle. The samples were prepared and submitted to pathology. Limited post procedural scanning was negative for hematoma or additional complication. Dressings were placed. The patient tolerated the above procedures procedure well without immediate postprocedural complication. IMPRESSION: Technically successful ultrasound guided fine needle aspiration of right thyroid nodule as described above. Read by: Ascencion Dike PA-C Electronically Signed   By: Markus Daft M.D.   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 Patient has been on Coumadin since March 2014 for PE and DVT. No overt evidence of INR being significantly supratherapeutic. Patient had EGD and colonoscopy in December 2016 that did not reveal an overt GI source of bleeding.  Her anemia has now resolved after having received 5 units of PRBCs with 2 hospitalizations for blood loss anemia. She has no anemia at this time. Her ferritin levels are good and her hemoglobin is completely normal. She has no clinical evidence of ongoing GI bleeding.  #2 history of DVT and PE in March 2014 on chronic anticoagulation. Plan  -Patient is tolerating oral iron. We'll continue to have her on ferrous sulfate 1 tablet orally twice a day with orange juice. She could reduce her ferrous sulfate 1 tablet by mouth daily if her hemoglobin and ferritin levels remain stable in another 1-2 months on follow-up with her primary care physician. -No indication for IV iron or PRBC transfusion at this time. -Avoid NSAIDs  -She has a plan to follow-up with gastric oncology for capsule endoscopy next month. We'll have her continue to follow with her primary care physician to monitor for  recurrent bleeding while on chronic anticoagulation as  per her cardiologist.  Follow-up with primary care physician for ongoing monitoring of CBC and monitor for evidence of further bleeding. RTC with Dr. Irene Limbo on an as-needed basis if any new questions or concerns arise  All of the patients questions were answered to her apparent satisfaction. The patient knows to call the clinic with any problems, questions or concerns.  I spent 20 minutes counseling the patient face to face. The total time spent in the appointment was 20 minutes and more than 50% was on counseling and direct patient cares.    Sullivan Lone MD [place] AAHIVMS [facility] Hematology/Oncology Physician [facility]  (Office):       [phone] (Work cell):  [phone] (Fax):           [phone]

## 2015-10-08 ENCOUNTER — Telehealth: Payer: Self-pay

## 2015-10-08 NOTE — Telephone Encounter (Signed)
Home health is calling to let us know that patient did not want a visit today and will reschedule

## 2015-10-08 NOTE — Telephone Encounter (Signed)
Ok noted  

## 2015-10-09 DIAGNOSIS — I82403 Acute embolism and thrombosis of unspecified deep veins of lower extremity, bilateral: Secondary | ICD-10-CM | POA: Diagnosis not present

## 2015-10-09 DIAGNOSIS — D649 Anemia, unspecified: Secondary | ICD-10-CM | POA: Diagnosis not present

## 2015-10-09 DIAGNOSIS — I110 Hypertensive heart disease with heart failure: Secondary | ICD-10-CM | POA: Diagnosis not present

## 2015-10-09 DIAGNOSIS — I5043 Acute on chronic combined systolic (congestive) and diastolic (congestive) heart failure: Secondary | ICD-10-CM | POA: Diagnosis not present

## 2015-10-09 DIAGNOSIS — M6281 Muscle weakness (generalized): Secondary | ICD-10-CM | POA: Diagnosis not present

## 2015-10-09 DIAGNOSIS — M170 Bilateral primary osteoarthritis of knee: Secondary | ICD-10-CM | POA: Diagnosis not present

## 2015-10-16 ENCOUNTER — Ambulatory Visit: Payer: Self-pay

## 2015-10-20 ENCOUNTER — Other Ambulatory Visit: Payer: Self-pay

## 2015-10-20 NOTE — Patient Outreach (Signed)
[facility]) Care Management  [place]  10/20/2015   Marlinda Miranda 1945-08-13 [id number]  Subjective: Telephone call to patient for health coach initial assessment.  Patient reports she is doing ok. Caring for her spouse and he is having some problems with sleeping at night, which keeps her up at night.  Discussed with patient importance of self care.  She verbalized understanding.  Patient continues to weigh daily. Patient weight today 195 lbs. Discussed with patient heart failure action plan and when to notify physician. She verbalized understanding.  No concerns.   Objective:   Encounter Medications:  Outpatient Encounter Prescriptions as of 10/20/2015  Medication Sig Note  . acetaminophen (TYLENOL) 325 MG tablet Take 650 mg by mouth every 6 (six) hours as needed for moderate pain.   . ferrous sulfate 325 (65 FE) MG tablet Take 1 tablet (325 mg total) by mouth 2 (two) times daily with a meal. 10/20/2015: Patient taking once a day.   . furosemide (LASIX) 40 MG tablet Take 1 tablet (40 mg total) by mouth 2 (two) times daily.   Marland Kitchen lisinopril (PRINIVIL,ZESTRIL) 2.5 MG tablet Take 1 tablet (2.5 mg total) by mouth daily.   . metoprolol (LOPRESSOR) 50 MG tablet Take 50 mg by mouth 2 (two) times daily.    . Multiple Vitamin (MULTIVITAMIN) tablet Take 1 tablet by mouth daily.   . nitroGLYCERIN (NITROSTAT) 0.4 MG SL tablet Place 1 tablet (0.4 mg total) under the tongue every 5 (five) minutes as needed for chest pain.   Marland Kitchen nystatin (MYCOSTATIN/NYSTOP) 100000 UNIT/GM POWD Apply to your groin TID   . potassium chloride SA (K-DUR,KLOR-CON) 20 MEQ tablet Take 2 tablets (40 mEq total) by mouth daily. (Patient taking differently: Take 20 mEq by mouth 2 (two) times daily. )   . warfarin (COUMADIN) 3 MG tablet TAKE 1 TO 1 AND 1/2 TABLETS BY MOUTH DAILY OR AS DIRECTED (Patient taking differently: takes 4.45m only on sun, takes 345mall other days) 09/24/2015: Patient taking 3 mg daily except  Sunday- taking 4.5 mg  . b complex vitamins capsule Take 1 capsule by mouth daily. (Patient not taking: Reported on 08/27/2015)   . zolpidem (AMBIEN) 5 MG tablet Take 1 tablet (5 mg total) by mouth at bedtime as needed for sleep. (Patient not taking: Reported on 10/20/2015) 07/29/2015: Medication ran out   No facility-administered encounter medications on file as of 10/20/2015.    Functional Status:  In your present state of health, do you have any difficulty performing the following activities: 10/20/2015 09/24/2015  Hearing? N N  Vision? N N  Difficulty concentrating or making decisions? N N  Walking or climbing stairs? Y Y  Dressing or bathing? N N  Doing errands, shopping? Y Tempie DonningPreparing Food and eating ? N Y  Using the Toilet? N N  In the past six months, have you accidently leaked urine? N N  Do you have problems with loss of bowel control? N N  Managing your Medications? N N  Managing your Finances? N N  Housekeeping or managing your Housekeeping? Y [name]  Fall/Depression Screening: PHQ 2/9 Scores 10/20/2015 10/20/2015 09/24/2015 08/12/2015 07/28/2015 07/21/2015 05/06/2015  PHQ - 2 Score 0 0 0 0 0 0 0    Assessment: Patient will benefit from health coach outreach for disease management and support.    Plan:  [facility] Problem [facility]        Most Recent Value   Care Plan Problem  One  at risk for readmission   Role Documenting the Problem One  [place] for Problem One  Active   THN Long Term Goal (31-90 days)  patient will not be redmitted in the next 60 days    THN Long Term Goal Met Date  10/20/15   Interventions for Problem One Long Term Goal  Patient has not had any admissions.    [facility] CM Care Plan Problem Two        Most Recent Value   Care Plan Problem Two  Knowledge deficit regarding heart failure management   Role Documenting the Problem Two  [place] for Problem Two  Active   Interventions for Problem Two Long Term Goal   RN Health Coach  discussed with patient the importance of daly weights, watching salt intake, and utilizing heart failure zone chart as a guide.    THN Long Term Goal (31-90) days  Patient will verbalize managing heart failure within the next 60 days   THN Long Term Goal Start Date  10/20/15      RN Health Coach will provide ongoing education for patient on heart failure through phone calls and sending printed information to patient for further discussion.  RN Health Coach sent welcome letter. RN Health Coach will send initial barriers letter, assessment, and care plan to primary care physician.  RN Health Coach will contact patient within one month and patient agrees to next contact.   Jone Baseman, RN, MSN [place] [phone]

## 2015-10-23 ENCOUNTER — Ambulatory Visit (INDEPENDENT_AMBULATORY_CARE_PROVIDER_SITE_OTHER): Payer: Medicare Other | Admitting: Pharmacist

## 2015-10-23 DIAGNOSIS — I2601 Septic pulmonary embolism with acute cor pulmonale: Secondary | ICD-10-CM | POA: Diagnosis not present

## 2015-10-23 DIAGNOSIS — I2699 Other pulmonary embolism without acute cor pulmonale: Secondary | ICD-10-CM

## 2015-10-23 DIAGNOSIS — Z7901 Long term (current) use of anticoagulants: Secondary | ICD-10-CM | POA: Diagnosis not present

## 2015-10-23 DIAGNOSIS — I82403 Acute embolism and thrombosis of unspecified deep veins of lower extremity, bilateral: Secondary | ICD-10-CM | POA: Diagnosis not present

## 2015-10-23 LAB — POCT INR: INR: 2.1

## 2015-10-26 DIAGNOSIS — E0590 Thyrotoxicosis, unspecified without thyrotoxic crisis or storm: Secondary | ICD-10-CM | POA: Diagnosis not present

## 2015-10-26 DIAGNOSIS — D649 Anemia, unspecified: Secondary | ICD-10-CM | POA: Diagnosis not present

## 2015-10-29 DIAGNOSIS — E0590 Thyrotoxicosis, unspecified without thyrotoxic crisis or storm: Secondary | ICD-10-CM | POA: Diagnosis not present

## 2015-10-29 DIAGNOSIS — E049 Nontoxic goiter, unspecified: Secondary | ICD-10-CM | POA: Diagnosis not present

## 2015-10-29 DIAGNOSIS — E669 Obesity, unspecified: Secondary | ICD-10-CM | POA: Diagnosis not present

## 2015-10-29 DIAGNOSIS — D649 Anemia, unspecified: Secondary | ICD-10-CM | POA: Diagnosis not present

## 2015-11-11 ENCOUNTER — Other Ambulatory Visit: Payer: Self-pay

## 2015-11-11 DIAGNOSIS — Z006 Encounter for examination for normal comparison and control in clinical research program: Secondary | ICD-10-CM

## 2015-11-11 NOTE — Patient Outreach (Signed)
[facility]) Care Management  [place]  11/11/2015   Angy Grindel 10-Mar-1946 [id number]  Subjective: Telephone call to patient for monthly call.  Patient reports she is doing better.  Patient reports fall about 10 days ago.  Patient not sure what happened but she was coming out of her room and fell.  Patient does ambulate with a cane outside the home. Discussed with patient fall precautions and that she probably needs to use her cane at all times.  She verbalized understanding.  Patient reports that she continues to weigh and maintain her low salt diet.  She reports her weights are the same.  Discussed signs and symptoms of heart failure and when to notify physician.  She verbalized understanding. No concerns.   Objective:   Encounter Medications:  Outpatient Encounter Prescriptions as of 11/11/2015  Medication Sig Note  . acetaminophen (TYLENOL) 325 MG tablet Take 650 mg by mouth every 6 (six) hours as needed for moderate pain.   . ferrous sulfate 325 (65 FE) MG tablet Take 1 tablet (325 mg total) by mouth 2 (two) times daily with a meal. 10/20/2015: Patient taking once a day.   . furosemide (LASIX) 40 MG tablet Take 1 tablet (40 mg total) by mouth 2 (two) times daily.   Marland Kitchen lisinopril (PRINIVIL,ZESTRIL) 2.5 MG tablet Take 1 tablet (2.5 mg total) by mouth daily.   . metoprolol (LOPRESSOR) 50 MG tablet Take 50 mg by mouth 2 (two) times daily.    . Multiple Vitamin (MULTIVITAMIN) tablet Take 1 tablet by mouth daily.   . nitroGLYCERIN (NITROSTAT) 0.4 MG SL tablet Place 1 tablet (0.4 mg total) under the tongue every 5 (five) minutes as needed for chest pain.   Marland Kitchen nystatin (MYCOSTATIN/NYSTOP) 100000 UNIT/GM POWD Apply to your groin TID   . potassium chloride SA (K-DUR,KLOR-CON) 20 MEQ tablet Take 2 tablets (40 mEq total) by mouth daily. (Patient taking differently: Take 20 mEq by mouth 2 (two) times daily. )   . warfarin (COUMADIN) 3 MG tablet TAKE 1 TO 1 AND 1/2 TABLETS BY  MOUTH DAILY OR AS DIRECTED (Patient taking differently: takes 4.5mg  only on sun, takes 3mg  all other days) 09/24/2015: Patient taking 3 mg daily except Sunday- taking 4.5 mg  . b complex vitamins capsule Take 1 capsule by mouth daily. (Patient not taking: Reported on 08/27/2015)   . zolpidem (AMBIEN) 5 MG tablet Take 1 tablet (5 mg total) by mouth at bedtime as needed for sleep. (Patient not taking: Reported on 11/11/2015) 07/29/2015: Medication ran out   No facility-administered encounter medications on file as of 11/11/2015.    Functional Status:  In your present state of health, do you have any difficulty performing the following activities: 10/20/2015 09/24/2015  Hearing? N N  Vision? N N  Difficulty concentrating or making decisions? N N  Walking or climbing stairs? Y Y  Dressing or bathing? N N  Doing errands, shopping? [name]  Preparing Food and eating ? N Y  Using the Toilet? N N  In the past six months, have you accidently leaked urine? N N  Do you have problems with loss of bowel control? N N  Managing your Medications? N N  Managing your Finances? N N  Housekeeping or managing your Housekeeping? [name]    Fall/Depression Screening: PHQ 2/9 Scores 11/11/2015 10/20/2015 10/20/2015 09/24/2015 08/12/2015 07/28/2015 07/21/2015  PHQ - 2 Score 0 0 0 0 0 0 0    Assessment: Patient continues to benefit from  health coach outreach for disease management and support.    Plan:  [facility] CM Care Plan Problem One        Most Recent Value   Care Plan Problem One  Recent fall   Role Documenting the Problem One  [place] for Problem One  Active   THN CM Short Term Goal #1 (0-30 days)  Patient will not have any falls within the next 30 days.   THN CM Short Term Goal #1 Start Date  11/11/15   Interventions for Short Term Goal #1  RN Health Coach discussed with patient fall precautions and utilizing assistive devices.     [facility] CM Care Plan Problem Two        Most Recent Value   Care Plan Problem  Two  Knowledge deficit regarding heart failure management   Role Documenting the Problem Two  [place] for Problem Two  Active   Interventions for Problem Two Long Term Goal   RN Health Coach reviewed with patient the importance of daly weights, watching salt intake, and utilizing heart failure zone chart as a guide.    THN Long Term Goal (31-90) days  Patient will verbalize managing heart failure within the next 60 days   THN Long Term Goal Start Date  10/20/15 [facility] continued]     [place] will contact patient within one month and patient agrees to next outreach.  Jone Baseman, RN, MSN [name] [phone]

## 2015-11-11 NOTE — Progress Notes (Signed)
Spoke to patient on phone and invited in to screen for [employer]. Patient states she is very interested in heart failure study as she want to feel better. Dr. [name], patient's primary cardiologist aware and agrees for patient to screen. Appt for July 12, 1 pm.

## 2015-11-20 ENCOUNTER — Ambulatory Visit (INDEPENDENT_AMBULATORY_CARE_PROVIDER_SITE_OTHER): Payer: Medicare Other | Admitting: Pharmacist Clinician (PhC)/ Clinical Pharmacy Specialist

## 2015-11-20 DIAGNOSIS — I2601 Septic pulmonary embolism with acute cor pulmonale: Secondary | ICD-10-CM | POA: Diagnosis not present

## 2015-11-20 DIAGNOSIS — I82403 Acute embolism and thrombosis of unspecified deep veins of lower extremity, bilateral: Secondary | ICD-10-CM | POA: Diagnosis not present

## 2015-11-20 DIAGNOSIS — Z7901 Long term (current) use of anticoagulants: Secondary | ICD-10-CM

## 2015-11-20 DIAGNOSIS — I2782 Chronic pulmonary embolism: Secondary | ICD-10-CM

## 2015-11-20 DIAGNOSIS — I2699 Other pulmonary embolism without acute cor pulmonale: Secondary | ICD-10-CM | POA: Diagnosis not present

## 2015-11-20 LAB — POCT INR: INR: 1.4

## 2015-11-25 VITALS — BP 154/87 | HR 73 | Resp 18 | Ht 63.0 in | Wt 199.6 lb

## 2015-11-25 DIAGNOSIS — Z006 Encounter for examination for normal comparison and control in clinical research program: Secondary | ICD-10-CM

## 2015-11-25 NOTE — Progress Notes (Unsigned)
Patient in today for [place] Study screening. Informed of study and questions answered. Patient signed consent, vitals and labs obtained. Patient's daughter Nicole Gibbs present for visit, very pleasant, and engaged in patient's care.

## 2015-12-04 ENCOUNTER — Telehealth: Payer: Self-pay | Admitting: *Deleted

## 2015-12-04 DIAGNOSIS — Z006 Encounter for examination for normal comparison and control in clinical research program: Secondary | ICD-10-CM

## 2015-12-04 DIAGNOSIS — Z79899 Other long term (current) drug therapy: Secondary | ICD-10-CM

## 2015-12-04 NOTE — Telephone Encounter (Signed)
Attempted to call patient to give her information about getting lab work done. She will need the directions.

## 2015-12-04 NOTE — Progress Notes (Signed)
Call to patient regarding Dr. Oleh Genin recommendation to have mg level drawn. Patient has history of prolonged QT and borderline low mg. Pt states she has lab visit with Dr. Kennon Holter office on Monday and she will inquire about getting mg drawn. She said she will speak to Dr. Lacie Scotts who is her PCP as well. Patient very pleasant with no complaints. Next Research Visit Aug 3rd at 1pm.

## 2015-12-04 NOTE — Telephone Encounter (Signed)
-----   Message from [name], MD sent at 12/04/2015  4:21 PM EDT ----- Regarding: RE: [name] Thanks, appreciate the follow-up. I asked Anderson Malta will get a serum magnesium on this patient.  JJB ----- Message -----    From: Marlana Salvage, RN    Sent: 12/04/2015  12:12 PM      To: [name], MD Subject: [name]                                    Hello, Nicole Gibbs joined the [place]. The study is a radomized double blind study to test the efficacy and safety of Oral [place] in subjects with heart failure.   Dr. Aundra Dubin reviewed her EKG and Labs and recommends a mg level as her QT is long. It has been long, and her mg has been borderline low. Just notifying you as the lab would have to be drawn by your office or her PCP. The patient is very pleasant with no complaints. Her current EKG and study labs, BMET and BNP will be scanned in to [facility].  Thank you, Deneise Lever

## 2015-12-07 ENCOUNTER — Ambulatory Visit (INDEPENDENT_AMBULATORY_CARE_PROVIDER_SITE_OTHER): Payer: Medicare Other | Admitting: Pharmacist

## 2015-12-07 DIAGNOSIS — I2699 Other pulmonary embolism without acute cor pulmonale: Secondary | ICD-10-CM

## 2015-12-07 DIAGNOSIS — Z7901 Long term (current) use of anticoagulants: Secondary | ICD-10-CM

## 2015-12-07 DIAGNOSIS — I82403 Acute embolism and thrombosis of unspecified deep veins of lower extremity, bilateral: Secondary | ICD-10-CM | POA: Diagnosis not present

## 2015-12-07 LAB — POCT INR: INR: 1.4

## 2015-12-08 ENCOUNTER — Other Ambulatory Visit: Payer: Self-pay

## 2015-12-08 NOTE — Patient Outreach (Signed)
Nicole Gibbs. [facility]) Care Management  Nicole Gibbs  12/08/2015   Nicole Gibbs [birth year] [id number]  Subjective: Telephone call to patient for monthly call.  Patient reports she is doing ok. Patient mentions being in Research program called with [place] for the heart. Patient reports that she has gone a few times for blood work.  Patient reports no falls since last call.  Discussed with patient EMMI fall information sent.  Patient reports that her INR has been off some and is to go back soon for recheck. Patient denies any use of herbal supplements or other over the counter medications that the doctor is not aware of. Discussed with patient heart failure and when to notify physician. She verbalized understanding. Also discussed trying to get active in order to help with her heart.  She verbalized understanding.   Objective:   Encounter Medications:  Outpatient Encounter Prescriptions as of 12/08/2015  Medication Sig Note  . acetaminophen (TYLENOL) 325 MG tablet Take 650 mg by mouth every 6 (six) hours as needed for moderate pain.   Marland Kitchen b complex vitamins capsule Take 1 capsule by mouth daily. (Patient not taking: Reported on 08/27/2015)   . ferrous sulfate 325 (65 FE) MG tablet Take 1 tablet (325 mg total) by mouth 2 (two) times daily with a meal. 10/20/2015: Patient taking once a day.   . furosemide (LASIX) 40 MG tablet Take 1 tablet (40 mg total) by mouth 2 (two) times daily.   Marland Kitchen lisinopril (PRINIVIL,ZESTRIL) 2.5 MG tablet Take 1 tablet (2.5 mg total) by mouth daily.   . metoprolol (LOPRESSOR) 50 MG tablet Take 50 mg by mouth 2 (two) times daily.    . Multiple Vitamin (MULTIVITAMIN) tablet Take 1 tablet by mouth daily.   . nitroGLYCERIN (NITROSTAT) 0.4 MG SL tablet Place 1 tablet (0.4 mg total) under the tongue every 5 (five) minutes as needed for chest pain.   Marland Kitchen nystatin (MYCOSTATIN/NYSTOP) 100000 UNIT/GM POWD Apply to your groin TID   . potassium chloride SA (K-DUR,KLOR-CON)  20 MEQ tablet Take 2 tablets (40 mEq total) by mouth daily. (Patient taking differently: Take 20 mEq by mouth 2 (two) times daily. )   . warfarin (COUMADIN) 3 MG tablet TAKE 1 TO 1 AND 1/2 TABLETS BY MOUTH DAILY OR AS DIRECTED (Patient taking differently: takes 4.35m only on sun, takes 375mall other days) 09/24/2015: Patient taking 3 mg daily except Sunday- taking 4.5 mg  . zolpidem (AMBIEN) 5 MG tablet Take 1 tablet (5 mg total) by mouth at bedtime as needed for sleep. (Patient not taking: Reported on 11/11/2015) 07/29/2015: Medication ran out   No facility-administered encounter medications on file as of 12/08/2015.     Functional Status:  In your present state of health, do you have any difficulty performing the following activities: 10/20/2015 09/24/2015  Hearing? N N  Vision? N N  Difficulty concentrating or making decisions? N N  Walking or climbing stairs? Y Y  Dressing or bathing? N N  Doing errands, shopping? Y Tempie DonningPreparing Food and eating ? N Y  Using the Toilet? N N  In the past six months, have you accidently leaked urine? N N  Do you have problems with loss of bowel control? N N  Managing your Medications? N N  Managing your Finances? N N  Housekeeping or managing your Housekeeping? Y Tempie DonningSome recent data might be hidden    Fall/Depression Screening: PHQ 2/9 Scores 11/11/2015 10/20/2015 10/20/2015 09/24/2015 08/12/2015 07/28/2015 07/21/2015  PHQ - 2 Score 0 0 0 0 0 0 0    Assessment: Patient continues to benefit from health coach outreach for disease management and support.   Plan:  [facility] CM Care Plan Problem One   Flowsheet Row Most Recent Value  Care Plan Problem One  Recent fall  Role Documenting the Problem One  [place] for Problem One  Active  THN CM Short Term Goal #1 (0-30 days)  Patient will not have any falls within the next 30 days.  THN CM Short Term Goal #1 Start Date  11/11/15  [facility] CM Short Term Goal #1 Met Date  12/08/15  Interventions for Short Term  Goal #1  Patient has had no falls.     [facility] CM Care Plan Problem Two   Flowsheet Row Most Recent Value  Care Plan Problem Two  Knowledge deficit regarding heart failure management  Role Documenting the Problem Two  [place] for Problem Two  Active  Interventions for Problem Two Long Term Goal   RN Health Coach reinforced with patient the importance of daly weights, watching salt intake, and utilizing heart failure zone chart as a guide.   THN Long Term Goal (31-90) days  Patient will verbalize managing heart failure within the next 60 days  THN Long Term Goal Start Date  10/20/15 [facility] continued]     [place] will contact patient in the month of August and patient agrees to next outreach.  Jone Baseman, RN, MSN [place] [phone]

## 2015-12-14 ENCOUNTER — Other Ambulatory Visit (HOSPITAL_COMMUNITY): Payer: Self-pay | Admitting: Cardiology

## 2015-12-17 DIAGNOSIS — Z006 Encounter for examination for normal comparison and control in clinical research program: Secondary | ICD-10-CM

## 2015-12-17 NOTE — Telephone Encounter (Signed)
Called patient to let her know Dr Kennon Holter recommendation to get a magnesium level drawn. She said she was going to [facility] today for lab work. She asked could she have it done there? Order for magnesium is in EPIC, not sure if it can be drawn today with those labs. Will ask Desmond Dike, RN if this is possible.  Patient verbalized understanding.

## 2015-12-17 NOTE — Progress Notes (Signed)
Patient here for Nicole Gibbs Study Visit 3. Patient states "I am doing well with study medication, I have not missed any does." No questions at today's visit. Dosage reviewed and patient verbalized understanding. Patient states "I am happy that my weight is the same, I am really trying to watch my diet, and avoid salt." Patient praised for working on good habits to improve her health. Patient verbalizes she will call Study Coordinator with any study related questions. She is getting her mg drawn at her cardiologist's office next week.

## 2015-12-18 NOTE — Telephone Encounter (Signed)
Returned call to patient. She said she would come and get the lab work prior to her coumadin check appt next week. She verbalized understanding on where lab is in the building.

## 2015-12-24 ENCOUNTER — Ambulatory Visit (INDEPENDENT_AMBULATORY_CARE_PROVIDER_SITE_OTHER): Payer: Medicare Other | Admitting: Pharmacist

## 2015-12-24 DIAGNOSIS — Z7901 Long term (current) use of anticoagulants: Secondary | ICD-10-CM | POA: Diagnosis not present

## 2015-12-24 DIAGNOSIS — I2699 Other pulmonary embolism without acute cor pulmonale: Secondary | ICD-10-CM | POA: Diagnosis not present

## 2015-12-24 DIAGNOSIS — I82403 Acute embolism and thrombosis of unspecified deep veins of lower extremity, bilateral: Secondary | ICD-10-CM | POA: Diagnosis not present

## 2015-12-24 DIAGNOSIS — Z79899 Other long term (current) drug therapy: Secondary | ICD-10-CM | POA: Diagnosis not present

## 2015-12-24 LAB — POCT INR: INR: 1.7

## 2015-12-25 LAB — MAGNESIUM: MAGNESIUM: 1.9 mg/dL (ref 1.5–2.5)

## 2016-01-01 DIAGNOSIS — Z006 Encounter for examination for normal comparison and control in clinical research program: Secondary | ICD-10-CM

## 2016-01-01 NOTE — Progress Notes (Signed)
Patient present for [place] Study visit 4. She states "I am doing well, but noticed my weight is up another pound. Maybe I am not watching my sodium intake as closely as I should." 2 gm sodium diet reviewed and educational handouts given. Patient's blood pressure WNL's for dose increase. Patient states "overall I feel good I have not missed any does of my study medications." . 3 bottles of study medication given and verified by Hillard Danker RN. Patient verbalizes to call with any questions regarding the study. She will call her cardiologist with any s/sx of heart failure exacerbation; weight gain of more than 2 pounds overnight.  Next study visit Nov 9th.

## 2016-01-05 ENCOUNTER — Other Ambulatory Visit: Payer: Self-pay

## 2016-01-05 NOTE — Patient Outreach (Signed)
[facility]) Care Management  [place]  01/05/2016   Nicole Gibbs Sep 08, 1945 [id number]  Subjective: Telephone call to patient for monthly call. Patient reports she is doing ok.  She reports some feelings of weakness.  Discussed with patient at length importance of remaining active in order to maintain strength.  Also mentioned to patient again silver sneaker programs to promote activity. Patient reports she has not had time to check into it but plans to. Patient remains part of research study for heart failure.  Patient reports that her INR is improving but still going every two weeks for monitoring.  Patient reports that she has not gained or lost any weight.  Discussed signs and symptoms of heart failure and when to notify physician.  She verbalized understanding.    Objective:   Encounter Medications:  Outpatient Encounter Prescriptions as of 01/05/2016  Medication Sig Note  . acetaminophen (TYLENOL) 325 MG tablet Take 650 mg by mouth every 6 (six) hours as needed for moderate pain.   . ferrous sulfate 325 (65 FE) MG tablet Take 1 tablet (325 mg total) by mouth 2 (two) times daily with a meal. 10/20/2015: Patient taking once a day.   . furosemide (LASIX) 40 MG tablet Take 1 tablet (40 mg total) by mouth 2 (two) times daily.   Marland Kitchen lisinopril (PRINIVIL,ZESTRIL) 2.5 MG tablet Take 1 tablet (2.5 mg total) by mouth daily.   . metoprolol (LOPRESSOR) 50 MG tablet Take 50 mg by mouth 2 (two) times daily.    . Multiple Vitamin (MULTIVITAMIN) tablet Take 1 tablet by mouth daily.   . nitroGLYCERIN (NITROSTAT) 0.4 MG SL tablet Place 1 tablet (0.4 mg total) under the tongue every 5 (five) minutes as needed for chest pain.   Marland Kitchen nystatin (MYCOSTATIN/NYSTOP) 100000 UNIT/GM POWD Apply to your groin TID   . potassium chloride SA (K-DUR,KLOR-CON) 20 MEQ tablet Take 2 tablets (40 mEq total) by mouth daily. (Patient taking differently: Take 20 mEq by mouth 2 (two) times daily. )   . warfarin  (COUMADIN) 3 MG tablet TAKE 1 TO 1 AND 1/2 TABLETS BY MOUTH DAILY OR AS DIRECTED (Patient taking differently: takes 4.5mg  only on sun, takes 3mg  all other days) 09/24/2015: Patient taking 3 mg daily except Sunday- taking 4.5 mg  . b complex vitamins capsule Take 1 capsule by mouth daily. (Patient not taking: Reported on 08/27/2015)   . zolpidem (AMBIEN) 5 MG tablet Take 1 tablet (5 mg total) by mouth at bedtime as needed for sleep. (Patient not taking: Reported on 12/08/2015) 07/29/2015: Medication ran out   No facility-administered encounter medications on file as of 01/05/2016.     Functional Status:  In your present state of health, do you have any difficulty performing the following activities: 10/20/2015 09/24/2015  Hearing? N N  Vision? N N  Difficulty concentrating or making decisions? N N  Walking or climbing stairs? Y Y  Dressing or bathing? N N  Doing errands, shopping? [name]  Preparing Food and eating ? N Y  Using the Toilet? N N  In the past six months, have you accidently leaked urine? N N  Do you have problems with loss of bowel control? N N  Managing your Medications? N N  Managing your Finances? N N  Housekeeping or managing your Housekeeping? [name]  Some recent data might be hidden    Fall/Depression Screening: PHQ 2/9 Scores 11/11/2015 10/20/2015 10/20/2015 09/24/2015 08/12/2015 07/28/2015 07/21/2015  PHQ - 2 Score 0 0  0 0 0 0 0    Assessment: Patient continues to benefit from health coach outreach for disease management and support.    Plan:  [facility] CM Care Plan Problem One   Flowsheet Row Most Recent Value  Care Plan for Problem One  Not Active    [facility] CM Care Plan Problem Two   Flowsheet Row Most Recent Value  Care Plan Problem Two  Knowledge deficit regarding heart failure management  Role Documenting the Problem Two  [place] for Problem Two  Active  Interventions for Problem Two Long Term Goal   RN Health Coach reviewed with patient the importance of daly  weights, watching salt intake, and utilizing heart failure zone chart as a guide.   THN Long Term Goal (31-90) days  Patient will verbalize managing heart failure within the next 60 days  THN Long Term Goal Start Date  01/05/16 [facility] continued]     [place] will contact patient in the month of September and patient agrees to next outreach.  Jone Baseman, RN, MSN [place] [phone]

## 2016-01-07 ENCOUNTER — Ambulatory Visit (INDEPENDENT_AMBULATORY_CARE_PROVIDER_SITE_OTHER): Payer: Medicare Other | Admitting: Pharmacist Clinician (PhC)/ Clinical Pharmacy Specialist

## 2016-01-07 DIAGNOSIS — Z7901 Long term (current) use of anticoagulants: Secondary | ICD-10-CM

## 2016-01-07 DIAGNOSIS — I2699 Other pulmonary embolism without acute cor pulmonale: Secondary | ICD-10-CM

## 2016-01-07 DIAGNOSIS — I82403 Acute embolism and thrombosis of unspecified deep veins of lower extremity, bilateral: Secondary | ICD-10-CM | POA: Diagnosis not present

## 2016-02-01 ENCOUNTER — Other Ambulatory Visit: Payer: Self-pay

## 2016-02-01 ENCOUNTER — Ambulatory Visit (INDEPENDENT_AMBULATORY_CARE_PROVIDER_SITE_OTHER): Payer: Medicare Other | Admitting: Pharmacist Clinician (PhC)/ Clinical Pharmacy Specialist

## 2016-02-01 DIAGNOSIS — I2699 Other pulmonary embolism without acute cor pulmonale: Secondary | ICD-10-CM

## 2016-02-01 DIAGNOSIS — I82403 Acute embolism and thrombosis of unspecified deep veins of lower extremity, bilateral: Secondary | ICD-10-CM | POA: Diagnosis not present

## 2016-02-01 DIAGNOSIS — Z7901 Long term (current) use of anticoagulants: Secondary | ICD-10-CM | POA: Diagnosis not present

## 2016-02-01 LAB — POCT INR: INR: 2.4

## 2016-02-01 MED ORDER — WARFARIN SODIUM 3 MG PO TABS: ORAL_TABLET | ORAL | 1 refills | Status: DC

## 2016-02-01 NOTE — Patient Outreach (Signed)
[facility]) Care Management  [place]  02/01/2016   Nicole Gibbs 1946-02-15 [id number]  Subjective: Telephone call to patient for monthly call. Patient reports she is doing good. She reports that her INR is better and that she is considering changing from coumadin to help with management.  Patient reports her weight is the same. Discussed with patient continuing to use the heart failure action plan as a guide and when to notify physician.  She verbalized understanding.   Objective:   Encounter Medications:  Outpatient Encounter Prescriptions as of 02/01/2016  Medication Sig Note  . acetaminophen (TYLENOL) 325 MG tablet Take 650 mg by mouth every 6 (six) hours as needed for moderate pain.   . ferrous sulfate 325 (65 FE) MG tablet Take 1 tablet (325 mg total) by mouth 2 (two) times daily with a meal. 10/20/2015: Patient taking once a day.   . furosemide (LASIX) 40 MG tablet Take 1 tablet (40 mg total) by mouth 2 (two) times daily.   Marland Kitchen lisinopril (PRINIVIL,ZESTRIL) 2.5 MG tablet Take 1 tablet (2.5 mg total) by mouth daily.   . metoprolol (LOPRESSOR) 50 MG tablet Take 50 mg by mouth 2 (two) times daily.    . Multiple Vitamin (MULTIVITAMIN) tablet Take 1 tablet by mouth daily.   . nitroGLYCERIN (NITROSTAT) 0.4 MG SL tablet Place 1 tablet (0.4 mg total) under the tongue every 5 (five) minutes as needed for chest pain.   Marland Kitchen nystatin (MYCOSTATIN/NYSTOP) 100000 UNIT/GM POWD Apply to your groin TID   . potassium chloride SA (K-DUR,KLOR-CON) 20 MEQ tablet Take 2 tablets (40 mEq total) by mouth daily. (Patient taking differently: Take 20 mEq by mouth 2 (two) times daily. )   . warfarin (COUMADIN) 3 MG tablet TAKE 1 TO 1 AND 1/2 TABLETS BY MOUTH DAILY OR AS DIRECTED (Patient taking differently: takes 4.5mg  only on sun, takes 3mg  all other days) 09/24/2015: Patient taking 3 mg daily except Sunday- taking 4.5 mg  . zolpidem (AMBIEN) 5 MG tablet Take 1 tablet (5 mg total) by mouth at  bedtime as needed for sleep. 07/29/2015: Medication ran out  . b complex vitamins capsule Take 1 capsule by mouth daily. (Patient not taking: Reported on 02/01/2016)    No facility-administered encounter medications on file as of 02/01/2016.     Functional Status:  In your present state of health, do you have any difficulty performing the following activities: 10/20/2015 09/24/2015  Hearing? N N  Vision? N N  Difficulty concentrating or making decisions? N N  Walking or climbing stairs? Y Y  Dressing or bathing? N N  Doing errands, shopping? [name]  Preparing Food and eating ? N Y  Using the Toilet? N N  In the past six months, have you accidently leaked urine? N N  Do you have problems with loss of bowel control? N N  Managing your Medications? N N  Managing your Finances? N N  Housekeeping or managing your Housekeeping? [name]  Some recent data might be hidden    Fall/Depression Screening: PHQ 2/9 Scores 02/01/2016 11/11/2015 10/20/2015 10/20/2015 09/24/2015 08/12/2015 07/28/2015  PHQ - 2 Score 0 0 0 0 0 0 0    Assessment: Patient continues to benefit from health coach outreach for disease management and support.    Plan:  [facility] CM Care Plan Problem Two   Flowsheet Row Most Recent Value  Care Plan Problem Two  Knowledge deficit regarding heart failure management  Role Documenting the Problem Two  [place] for Problem Two  Active  Interventions for Problem Two Long Term Goal   RN Health Coach reinforced with patient the importance of daly weights, watching salt intake, and utilizing heart failure zone chart as a guide.   THN Long Term Goal (31-90) days  Patient will verbalize managing heart failure within the next 60 days  THN Long Term Goal Start Date  02/01/16 [facility] continued]      [place] will contact patient in the month of October and patient agrees to next outreach.  Jone Baseman, RN, MSN [place] [phone]

## 2016-02-26 ENCOUNTER — Other Ambulatory Visit: Payer: Self-pay

## 2016-02-26 NOTE — Patient Outreach (Signed)
[facility]) Care Management  [place]  02/26/2016   Nicole Gibbs December 29, 1945 [id number]  Subjective: Telephone call to patient for monthly call. Patient reports she is doing good.  She reports weight is about the same.  Patient is continuing to stay away from salt.  Continues with heart failure research study.  Discussed with patient heart failure action plan and when to notify physician. She verbalized understanding. No concerns.   Objective:   Encounter Medications:  Outpatient Encounter Prescriptions as of 02/26/2016  Medication Sig Note  . acetaminophen (TYLENOL) 325 MG tablet Take 650 mg by mouth every 6 (six) hours as needed for moderate pain.   . ferrous sulfate 325 (65 FE) MG tablet Take 1 tablet (325 mg total) by mouth 2 (two) times daily with a meal. 10/20/2015: Patient taking once a day.   . furosemide (LASIX) 40 MG tablet Take 1 tablet (40 mg total) by mouth 2 (two) times daily.   Marland Kitchen lisinopril (PRINIVIL,ZESTRIL) 2.5 MG tablet Take 1 tablet (2.5 mg total) by mouth daily.   . metoprolol (LOPRESSOR) 50 MG tablet Take 50 mg by mouth 2 (two) times daily.    . Multiple Vitamin (MULTIVITAMIN) tablet Take 1 tablet by mouth daily.   . nitroGLYCERIN (NITROSTAT) 0.4 MG SL tablet Place 1 tablet (0.4 mg total) under the tongue every 5 (five) minutes as needed for chest pain.   Marland Kitchen nystatin (MYCOSTATIN/NYSTOP) 100000 UNIT/GM POWD Apply to your groin TID   . potassium chloride SA (K-DUR,KLOR-CON) 20 MEQ tablet Take 2 tablets (40 mEq total) by mouth daily. (Patient taking differently: Take 20 mEq by mouth 2 (two) times daily. )   . warfarin (COUMADIN) 3 MG tablet TAKE 1 TO 1 AND 1/2 TABLETS BY MOUTH DAILY OR AS DIRECTED   . b complex vitamins capsule Take 1 capsule by mouth daily. (Patient not taking: Reported on 02/26/2016)   . zolpidem (AMBIEN) 5 MG tablet Take 1 tablet (5 mg total) by mouth at bedtime as needed for sleep. (Patient not taking: Reported on 02/26/2016)  07/29/2015: Medication ran out   No facility-administered encounter medications on file as of 02/26/2016.     Functional Status:  In your present state of health, do you have any difficulty performing the following activities: 10/20/2015 09/24/2015  Hearing? N N  Vision? N N  Difficulty concentrating or making decisions? N N  Walking or climbing stairs? Y Y  Dressing or bathing? N N  Doing errands, shopping? [name]  Preparing Food and eating ? N Y  Using the Toilet? N N  In the past six months, have you accidently leaked urine? N N  Do you have problems with loss of bowel control? N N  Managing your Medications? N N  Managing your Finances? N N  Housekeeping or managing your Housekeeping? [name]  Some recent data might be hidden    Fall/Depression Screening: PHQ 2/9 Scores 02/26/2016 02/01/2016 11/11/2015 10/20/2015 10/20/2015 09/24/2015 08/12/2015  PHQ - 2 Score 0 0 0 0 0 0 0    Assessment: Patient continues to benefit from health coach outreach for disease management and support.    Plan:  [facility] CM Care Plan Problem Two   Flowsheet Row Most Recent Value  Care Plan Problem Two  Knowledge deficit regarding heart failure management  Role Documenting the Problem Two  [place] for Problem Two  Active  Interventions for Problem Two Long Term Goal   RN Health Coach reviewed with  patient the importance of daly weights, watching salt intake, and utilizing heart failure zone chart as a guide.   THN Long Term Goal (31-90) days  Patient will verbalize managing heart failure within the next 60 days  THN Long Term Goal Start Date  02/26/16 [facility] continued]     [place] will contact patient in the month of November and patient agrees to next outreach.  Jone Baseman, RN, MSN [place] [phone]

## 2016-02-29 ENCOUNTER — Ambulatory Visit (INDEPENDENT_AMBULATORY_CARE_PROVIDER_SITE_OTHER): Payer: Medicare Other | Admitting: Pharmacist

## 2016-02-29 DIAGNOSIS — I2699 Other pulmonary embolism without acute cor pulmonale: Secondary | ICD-10-CM

## 2016-02-29 DIAGNOSIS — I82403 Acute embolism and thrombosis of unspecified deep veins of lower extremity, bilateral: Secondary | ICD-10-CM

## 2016-02-29 DIAGNOSIS — Z7901 Long term (current) use of anticoagulants: Secondary | ICD-10-CM | POA: Diagnosis not present

## 2016-02-29 LAB — POCT INR: INR: 2.2

## 2016-02-29 MED ORDER — WARFARIN SODIUM 3 MG PO TABS: ORAL_TABLET | ORAL | 1 refills | Status: DC

## 2016-03-03 DIAGNOSIS — Z23 Encounter for immunization: Secondary | ICD-10-CM | POA: Diagnosis not present

## 2016-03-03 DIAGNOSIS — E049 Nontoxic goiter, unspecified: Secondary | ICD-10-CM | POA: Diagnosis not present

## 2016-03-03 DIAGNOSIS — E0590 Thyrotoxicosis, unspecified without thyrotoxic crisis or storm: Secondary | ICD-10-CM | POA: Diagnosis not present

## 2016-03-23 ENCOUNTER — Other Ambulatory Visit: Payer: Self-pay

## 2016-03-23 MED ORDER — AMBULATORY NON FORMULARY MEDICATION: 10.0000 mg | Freq: Once | Status: DC

## 2016-03-25 DIAGNOSIS — Z006 Encounter for examination for normal comparison and control in clinical research program: Secondary | ICD-10-CM

## 2016-03-25 NOTE — Progress Notes (Signed)
Patient present for [place] Heart Failure Study Visit 5. States "I feel good, but have been feeling more stress recently related to my mother and husband's health poor health. I feel I am stress eating and I know my weight is gradually going up, about a pound a month". Patient denies SOB, or increased fatigue. Patient's LE's are unchanged. Patient does not exercise but will think about [place] returned demonstrating excellent study medication compliance. Verbalizes compliance with her prescribed medication except she "struggles" to take lasix as it "makes her run to the bathroom." Vital signs WNL's. She is doing well and is not volume overloaded on exam. Labs and ECG performed for study visit. Dr. Aundra Dubin signed off ECG, and sees no acute changes since last ECG. She is ambulating slowly with a cane, again I encouraged her to become more active. IP dispensed. Very appreciation of care.

## 2016-03-28 ENCOUNTER — Ambulatory Visit (INDEPENDENT_AMBULATORY_CARE_PROVIDER_SITE_OTHER): Payer: Medicare Other | Admitting: Pharmacist Clinician (PhC)/ Clinical Pharmacy Specialist

## 2016-03-28 ENCOUNTER — Other Ambulatory Visit: Payer: Self-pay

## 2016-03-28 ENCOUNTER — Telehealth: Payer: Self-pay | Admitting: Pharmacist Clinician (PhC)/ Clinical Pharmacy Specialist

## 2016-03-28 DIAGNOSIS — Z7901 Long term (current) use of anticoagulants: Secondary | ICD-10-CM

## 2016-03-28 DIAGNOSIS — I2699 Other pulmonary embolism without acute cor pulmonale: Secondary | ICD-10-CM | POA: Diagnosis not present

## 2016-03-28 DIAGNOSIS — I82403 Acute embolism and thrombosis of unspecified deep veins of lower extremity, bilateral: Secondary | ICD-10-CM | POA: Diagnosis not present

## 2016-03-28 LAB — POCT INR: INR: 2

## 2016-03-28 NOTE — Telephone Encounter (Signed)
Med list updated

## 2016-03-28 NOTE — Patient Outreach (Signed)
[name] [facility]) Care Management  [place]  03/28/2016   Nicole Gibbs 1946/01/08 [id number]  Subjective: Telephone call to patient for monthly call. She reports she is doing well.  She states that she is learning to split her lasix dose as she has problems with frequent urination and it stops her from going out most of the time.  Reiterated with patient heart failure goals of weight gain and when to notify physician.  She verbalized understanding.  Discussed with patient moving calls to every other month.  Patient in agreement.   Objective:   Encounter Medications:  Outpatient Encounter Prescriptions as of 03/28/2016  Medication Sig Note  . acetaminophen (TYLENOL) 325 MG tablet Take 650 mg by mouth every 6 (six) hours as needed for moderate pain.   . ferrous sulfate 325 (65 FE) MG tablet Take 1 tablet (325 mg total) by mouth 2 (two) times daily with a meal. 10/20/2015: Patient taking once a day.   . furosemide (LASIX) 40 MG tablet Take 1 tablet (40 mg total) by mouth 2 (two) times daily.   Marland Kitchen lisinopril (PRINIVIL,ZESTRIL) 2.5 MG tablet Take 1 tablet (2.5 mg total) by mouth daily.   . metoprolol (LOPRESSOR) 50 MG tablet Take 50 mg by mouth 2 (two) times daily.    . Multiple Vitamin (MULTIVITAMIN) tablet Take 1 tablet by mouth daily.   . nitroGLYCERIN (NITROSTAT) 0.4 MG SL tablet Place 1 tablet (0.4 mg total) under the tongue every 5 (five) minutes as needed for chest pain.   Marland Kitchen nystatin (MYCOSTATIN/NYSTOP) 100000 UNIT/GM POWD Apply to your groin TID   . potassium chloride SA (K-DUR,KLOR-CON) 20 MEQ tablet Take 2 tablets (40 mEq total) by mouth daily. (Patient taking differently: Take 20 mEq by mouth 2 (two) times daily. )   . warfarin (COUMADIN) 3 MG tablet TAKE 1 TO 1 AND 1/2 TABLETS BY MOUTH DAILY OR AS DIRECTED   . AMBULATORY NON FORMULARY MEDICATION Take 10 mg by mouth once. Medication Name: Verciguat 10mg  tablet daily ([place] Heart Failure Study)   . b complex  vitamins capsule Take 1 capsule by mouth daily. (Patient not taking: Reported on 03/28/2016)   . zolpidem (AMBIEN) 5 MG tablet Take 1 tablet (5 mg total) by mouth at bedtime as needed for sleep. (Patient not taking: Reported on 03/28/2016) 07/29/2015: Medication ran out   No facility-administered encounter medications on file as of 03/28/2016.     Functional Status:  In your present state of health, do you have any difficulty performing the following activities: 10/20/2015 09/24/2015  Hearing? N N  Vision? N N  Difficulty concentrating or making decisions? N N  Walking or climbing stairs? Y Y  Dressing or bathing? N N  Doing errands, shopping? [name]  Preparing Food and eating ? N Y  Using the Toilet? N N  In the past six months, have you accidently leaked urine? N N  Do you have problems with loss of bowel control? N N  Managing your Medications? N N  Managing your Finances? N N  Housekeeping or managing your Housekeeping? [name]  Some recent data might be hidden    Fall/Depression Screening: PHQ 2/9 Scores 03/28/2016 02/26/2016 02/01/2016 11/11/2015 10/20/2015 10/20/2015 09/24/2015  PHQ - 2 Score 0 0 0 0 0 0 0    Assessment: Patient continues to benefit from health coach outreach for disease management and support.    Plan:  [facility] CM Care Plan Problem Two   Flowsheet Row Most Recent Value  Care Plan Problem Two  Knowledge deficit regarding heart failure management  Role Documenting the Problem Two  [place] for Problem Two  Active  Interventions for Problem Two Long Term Goal   RN Health Coach reiterated with patient the importance of daly weights, watching salt intake, and utilizing heart failure zone chart as a guide.   THN Long Term Goal (31-90) days  Patient will verbalize managing heart failure within the next 60 days  THN Long Term Goal Start Date  03/28/16 [facility] continued]     [place] will contact patient in the month of January and patient agrees to next  outreach.  Jone Baseman, RN, MSN [place] [phone]

## 2016-04-26 DIAGNOSIS — D649 Anemia, unspecified: Secondary | ICD-10-CM | POA: Diagnosis not present

## 2016-04-26 DIAGNOSIS — I429 Cardiomyopathy, unspecified: Secondary | ICD-10-CM | POA: Diagnosis not present

## 2016-04-26 DIAGNOSIS — E0590 Thyrotoxicosis, unspecified without thyrotoxic crisis or storm: Secondary | ICD-10-CM | POA: Diagnosis not present

## 2016-04-28 ENCOUNTER — Ambulatory Visit (INDEPENDENT_AMBULATORY_CARE_PROVIDER_SITE_OTHER): Payer: Medicare Other | Admitting: Pharmacist Clinician (PhC)/ Clinical Pharmacy Specialist

## 2016-04-28 DIAGNOSIS — Z7901 Long term (current) use of anticoagulants: Secondary | ICD-10-CM | POA: Diagnosis not present

## 2016-04-28 DIAGNOSIS — I2699 Other pulmonary embolism without acute cor pulmonale: Secondary | ICD-10-CM

## 2016-04-28 DIAGNOSIS — I82403 Acute embolism and thrombosis of unspecified deep veins of lower extremity, bilateral: Secondary | ICD-10-CM | POA: Diagnosis not present

## 2016-04-28 LAB — POCT INR: INR: 2.1

## 2016-05-02 DIAGNOSIS — I10 Essential (primary) hypertension: Secondary | ICD-10-CM | POA: Diagnosis not present

## 2016-05-02 DIAGNOSIS — Z0000 Encounter for general adult medical examination without abnormal findings: Secondary | ICD-10-CM | POA: Diagnosis not present

## 2016-05-02 DIAGNOSIS — E0590 Thyrotoxicosis, unspecified without thyrotoxic crisis or storm: Secondary | ICD-10-CM | POA: Diagnosis not present

## 2016-05-02 DIAGNOSIS — I82409 Acute embolism and thrombosis of unspecified deep veins of unspecified lower extremity: Secondary | ICD-10-CM | POA: Diagnosis not present

## 2016-05-02 DIAGNOSIS — I429 Cardiomyopathy, unspecified: Secondary | ICD-10-CM | POA: Diagnosis not present

## 2016-05-23 ENCOUNTER — Other Ambulatory Visit: Payer: Self-pay

## 2016-05-23 NOTE — Patient Outreach (Signed)
[facility]) Care Management  05/23/2016  Cecily Eschete 1946/03/23 [id number]   Telephone call to patient for every other month call.  Female answers stating patient not in. Health coach contact information given.    Plan: RN Health Coach will attempt patient again in the month of January.    Jone Baseman, RN, MSN [place] [phone]

## 2016-05-25 ENCOUNTER — Other Ambulatory Visit: Payer: Self-pay | Admitting: Cardiovascular Disease

## 2016-05-26 ENCOUNTER — Other Ambulatory Visit: Payer: Self-pay | Admitting: *Deleted

## 2016-05-26 MED ORDER — WARFARIN SODIUM 3 MG PO TABS: ORAL_TABLET | ORAL | 3 refills | Status: DC

## 2016-06-06 ENCOUNTER — Other Ambulatory Visit: Payer: Self-pay

## 2016-06-06 NOTE — Patient Outreach (Signed)
[facility] [facility]) Care Management  [place]  06/06/2016   Norie Glennon [birth year] [id number]  Subjective: Telephone call to patient for every other month call. Patient reports she is doing good.  She reports some weight gain as she has not been as active and is eating some of the wrong things.  Discussed with patient difference between fluid weight gain and just weight gain.  Also discussed with patient heart failure and importance to note any symptoms of increased shortness of breath and swelling to feet, legs, and abdomen.  She verbalized understanding.    Objective:   Encounter Medications:  Outpatient Encounter Prescriptions as of 06/06/2016  Medication Sig Note  . acetaminophen (TYLENOL) 325 MG tablet Take 650 mg by mouth every 6 (six) hours as needed for moderate pain.   . ferrous sulfate 325 (65 FE) MG tablet Take 1 tablet (325 mg total) by mouth 2 (two) times daily with a meal. (Patient taking differently: Take 325 mg by mouth daily with breakfast. ) 10/20/2015: Patient taking once a day.   . furosemide (LASIX) 40 MG tablet Take 1 tablet (40 mg total) by mouth 2 (two) times daily. (Patient taking differently: Take 20 mg by mouth 2 (two) times daily. )   . lisinopril (PRINIVIL,ZESTRIL) 2.5 MG tablet Take 1 tablet (2.5 mg total) by mouth daily.   . metoprolol (LOPRESSOR) 50 MG tablet Take 50 mg by mouth 2 (two) times daily.    . Multiple Vitamin (MULTIVITAMIN) tablet Take 1 tablet by mouth daily.   . nitroGLYCERIN (NITROSTAT) 0.4 MG SL tablet Place 1 tablet (0.4 mg total) under the tongue every 5 (five) minutes as needed for chest pain.   Marland Kitchen nystatin (MYCOSTATIN/NYSTOP) 100000 UNIT/GM POWD Apply to your groin TID   . potassium chloride SA (K-DUR,KLOR-CON) 20 MEQ tablet Take 2 tablets (40 mEq total) by mouth daily. (Patient taking differently: Take 20 mEq by mouth 2 (two) times daily. )   . warfarin (COUMADIN) 3 MG tablet TAKE 1 TO 1 AND 1/2 TABLETS BY MOUTH DAILY OR AS  DIRECTED   . AMBULATORY NON FORMULARY MEDICATION Take 10 mg by mouth once. Medication Name: Verciguat 10mg  tablet daily ([place] Heart Failure Study)   . b complex vitamins capsule Take 1 capsule by mouth daily. (Patient not taking: Reported on 03/28/2016)   . zolpidem (AMBIEN) 5 MG tablet Take 1 tablet (5 mg total) by mouth at bedtime as needed for sleep. (Patient not taking: Reported on 03/28/2016) 07/29/2015: Medication ran out   No facility-administered encounter medications on file as of 06/06/2016.     Functional Status:  In your present state of health, do you have any difficulty performing the following activities: 10/20/2015 09/24/2015  Hearing? N N  Vision? N N  Difficulty concentrating or making decisions? N N  Walking or climbing stairs? Y Y  Dressing or bathing? N N  Doing errands, shopping? [name]  Preparing Food and eating ? N Y  Using the Toilet? N N  In the past six months, have you accidently leaked urine? N N  Do you have problems with loss of bowel control? N N  Managing your Medications? N N  Managing your Finances? N N  Housekeeping or managing your Housekeeping? [name]  Some recent data might be hidden    Fall/Depression Screening: PHQ 2/9 Scores 06/06/2016 03/28/2016 02/26/2016 02/01/2016 11/11/2015 10/20/2015 10/20/2015  PHQ - 2 Score 0 0 0 0 0 0 0    Assessment: Patient continues to  benefit from health coach outreach for disease management and support.    Plan:  [facility] CM Care Plan Problem Two   Flowsheet Row Most Recent Value  Care Plan Problem Two  Knowledge deficit regarding heart failure management  Role Documenting the Problem Two  [place] for Problem Two  Active  Interventions for Problem Two Long Term Goal   RN Health Coach reviewed with patient the importance of daly weights, watching salt intake, and utilizing heart failure zone chart as a guide.   THN Long Term Goal (31-90) days  Patient will verbalize managing heart failure within the next 60  days  THN Long Term Goal Start Date  06/06/16 [facility] continued]     [place] will contact patient in the month of March and patient agrees to next outreach.  Jone Baseman, RN, MSN [place] [phone]

## 2016-06-08 ENCOUNTER — Ambulatory Visit (INDEPENDENT_AMBULATORY_CARE_PROVIDER_SITE_OTHER): Payer: Medicare Other | Admitting: Pharmacist

## 2016-06-08 DIAGNOSIS — I82403 Acute embolism and thrombosis of unspecified deep veins of lower extremity, bilateral: Secondary | ICD-10-CM | POA: Diagnosis not present

## 2016-06-08 DIAGNOSIS — I2699 Other pulmonary embolism without acute cor pulmonale: Secondary | ICD-10-CM | POA: Diagnosis not present

## 2016-06-08 DIAGNOSIS — Z7901 Long term (current) use of anticoagulants: Secondary | ICD-10-CM

## 2016-06-08 LAB — POCT INR: INR: 1.9

## 2016-06-08 MED ORDER — WARFARIN SODIUM 3 MG PO TABS: ORAL_TABLET | ORAL | 1 refills | Status: DC

## 2016-06-29 ENCOUNTER — Other Ambulatory Visit: Payer: Self-pay

## 2016-06-29 MED ORDER — METOPROLOL TARTRATE 50 MG PO TABS: 50.0000 mg | ORAL_TABLET | Freq: Two times a day (BID) | ORAL | 1 refills | Status: DC

## 2016-06-30 ENCOUNTER — Other Ambulatory Visit: Payer: Self-pay | Admitting: Pharmacist

## 2016-06-30 MED ORDER — WARFARIN SODIUM 3 MG PO TABS: ORAL_TABLET | ORAL | 1 refills | Status: DC

## 2016-07-06 ENCOUNTER — Ambulatory Visit (INDEPENDENT_AMBULATORY_CARE_PROVIDER_SITE_OTHER): Payer: Medicare Other | Admitting: Pharmacist Clinician (PhC)/ Clinical Pharmacy Specialist

## 2016-07-06 DIAGNOSIS — Z7901 Long term (current) use of anticoagulants: Secondary | ICD-10-CM

## 2016-07-06 DIAGNOSIS — I82403 Acute embolism and thrombosis of unspecified deep veins of lower extremity, bilateral: Secondary | ICD-10-CM

## 2016-07-06 DIAGNOSIS — I2699 Other pulmonary embolism without acute cor pulmonale: Secondary | ICD-10-CM

## 2016-07-14 DIAGNOSIS — Z006 Encounter for examination for normal comparison and control in clinical research program: Secondary | ICD-10-CM

## 2016-07-18 ENCOUNTER — Other Ambulatory Visit: Payer: Self-pay

## 2016-07-18 NOTE — Patient Outreach (Signed)
[place] [facility]) Care Management  07/18/2016  Nicole Gibbs [birth year] [id number]   Telephone call to patient for every other month call.  No answer.  HIPAA compliant voice message left.  Plan: RN Health Coach will attempt patient again in the month of March.  Jone Baseman, RN, MSN [place] [phone]

## 2016-08-01 ENCOUNTER — Other Ambulatory Visit: Payer: Self-pay

## 2016-08-01 NOTE — Patient Outreach (Signed)
[place] [facility]) Care Management  [employer] [facility]  08/01/2016   Kyley Bamberg [birth year] [id number]  Subjective: Telephone call to patient for every other month call.  Patient reports that she is doing good.  She reports that she still participates in heart failure study.  Patient reports she continues to weight daily and that her last weight was 215 lbs. Reinforced with patient importance of using heart failure zone chart as a guide to daily care.  She verbalized understanding.    Objective:   Encounter Medications:  Outpatient Encounter Prescriptions as of 08/01/2016  Medication Sig Note  . acetaminophen (TYLENOL) 325 MG tablet Take 650 mg by mouth every 6 (six) hours as needed for moderate pain.   . ferrous sulfate 325 (65 FE) MG tablet Take 1 tablet (325 mg total) by mouth 2 (two) times daily with a meal. (Patient taking differently: Take 325 mg by mouth daily with breakfast. ) 10/20/2015: Patient taking once a day.   . furosemide (LASIX) 40 MG tablet Take 1 tablet (40 mg total) by mouth 2 (two) times daily. (Patient taking differently: Take 20 mg by mouth 2 (two) times daily. )   . lisinopril (PRINIVIL,ZESTRIL) 2.5 MG tablet Take 1 tablet (2.5 mg total) by mouth daily.   . metoprolol (LOPRESSOR) 50 MG tablet Take 1 tablet (50 mg total) by mouth 2 (two) times daily.   . Multiple Vitamin (MULTIVITAMIN) tablet Take 1 tablet by mouth daily.   . nitroGLYCERIN (NITROSTAT) 0.4 MG SL tablet Place 1 tablet (0.4 mg total) under the tongue every 5 (five) minutes as needed for chest pain.   Marland Kitchen nystatin (MYCOSTATIN/NYSTOP) 100000 UNIT/GM POWD Apply to your groin TID   . potassium chloride SA (K-DUR,KLOR-CON) 20 MEQ tablet Take 2 tablets (40 mEq total) by mouth daily. (Patient taking differently: Take 20 mEq by mouth 2 (two) times daily. )   . warfarin (COUMADIN) 3 MG tablet TAKE 1 TO 1 AND 1/2 TABLETS BY MOUTH DAILY OR AS DIRECTED   . AMBULATORY NON FORMULARY MEDICATION Take 10 mg by mouth  once. Medication Name: Verciguat 10mg  tablet daily ([place] Heart Failure Study)   . b complex vitamins capsule Take 1 capsule by mouth daily. (Patient not taking: Reported on 03/28/2016)   . zolpidem (AMBIEN) 5 MG tablet Take 1 tablet (5 mg total) by mouth at bedtime as needed for sleep. (Patient not taking: Reported on 03/28/2016) 07/29/2015: Medication ran out   No facility-administered encounter medications on file as of 08/01/2016.     Functional Status:  In your present state of health, do you have any difficulty performing the following activities: 10/20/2015 09/24/2015  Hearing? N N  Vision? N N  Difficulty concentrating or making decisions? N N  Walking or climbing stairs? Y Y  Dressing or bathing? N N  Doing errands, shopping? [name]  Preparing Food and eating ? N Y  Using the Toilet? N N  In the past six months, have you accidently leaked urine? N N  Do you have problems with loss of bowel control? N N  Managing your Medications? N N  Managing your Finances? N N  Housekeeping or managing your Housekeeping? [name]  Some recent data might be hidden    Fall/Depression Screening: PHQ 2/9 Scores 08/01/2016 06/06/2016 03/28/2016 02/26/2016 02/01/2016 11/11/2015 10/20/2015  PHQ - 2 Score 0 0 0 0 0 0 0    Assessment: Patient continues to benefit from health coach outreach for disease management and support.  Plan:  [facility] CM Care Plan Problem Two     Most Recent Value  Care Plan Problem Two  Knowledge deficit regarding heart failure management  Role Documenting the Problem Two  [place] for Problem Two  Active  Interventions for Problem Two Long Term Goal   RN Health Coach reinforced with patient the importance of daly weights, watching salt intake, and utilizing heart failure zone chart as a guide.   THN Long Term Goal (31-90) days  Patient will verbalize managing heart failure within the next 60 days  THN Long Term Goal Start Date  08/01/16 [facility] continued]     [place] will contact patient in the month of April and patient agrees to next outreach.  Jone Baseman, RN, MSN [place] [phone]

## 2016-08-02 ENCOUNTER — Ambulatory Visit (INDEPENDENT_AMBULATORY_CARE_PROVIDER_SITE_OTHER): Payer: Medicare Other | Admitting: Pharmacist Clinician (PhC)/ Clinical Pharmacy Specialist

## 2016-08-02 DIAGNOSIS — Z7901 Long term (current) use of anticoagulants: Secondary | ICD-10-CM | POA: Diagnosis not present

## 2016-08-02 DIAGNOSIS — I82403 Acute embolism and thrombosis of unspecified deep veins of lower extremity, bilateral: Secondary | ICD-10-CM | POA: Diagnosis not present

## 2016-08-02 DIAGNOSIS — I2699 Other pulmonary embolism without acute cor pulmonale: Secondary | ICD-10-CM | POA: Diagnosis not present

## 2016-08-02 LAB — POCT INR: INR: 3.6

## 2016-08-15 ENCOUNTER — Ambulatory Visit (INDEPENDENT_AMBULATORY_CARE_PROVIDER_SITE_OTHER): Payer: Medicare Other | Admitting: Pharmacist Clinician (PhC)/ Clinical Pharmacy Specialist

## 2016-08-15 DIAGNOSIS — I2699 Other pulmonary embolism without acute cor pulmonale: Secondary | ICD-10-CM | POA: Diagnosis not present

## 2016-08-15 DIAGNOSIS — I82403 Acute embolism and thrombosis of unspecified deep veins of lower extremity, bilateral: Secondary | ICD-10-CM | POA: Diagnosis not present

## 2016-08-15 DIAGNOSIS — Z7901 Long term (current) use of anticoagulants: Secondary | ICD-10-CM | POA: Diagnosis not present

## 2016-08-15 LAB — POCT INR: INR: 3.1

## 2016-09-05 ENCOUNTER — Ambulatory Visit (INDEPENDENT_AMBULATORY_CARE_PROVIDER_SITE_OTHER): Payer: Medicare Other | Admitting: Pharmacist Clinician (PhC)/ Clinical Pharmacy Specialist

## 2016-09-05 DIAGNOSIS — I82403 Acute embolism and thrombosis of unspecified deep veins of lower extremity, bilateral: Secondary | ICD-10-CM

## 2016-09-05 DIAGNOSIS — Z7901 Long term (current) use of anticoagulants: Secondary | ICD-10-CM | POA: Diagnosis not present

## 2016-09-05 DIAGNOSIS — I2699 Other pulmonary embolism without acute cor pulmonale: Secondary | ICD-10-CM | POA: Diagnosis not present

## 2016-09-05 LAB — POCT INR: INR: 3.1

## 2016-09-26 ENCOUNTER — Ambulatory Visit (INDEPENDENT_AMBULATORY_CARE_PROVIDER_SITE_OTHER): Payer: Medicare Other | Admitting: Pharmacist Clinician (PhC)/ Clinical Pharmacy Specialist

## 2016-09-26 ENCOUNTER — Ambulatory Visit: Payer: Self-pay

## 2016-09-26 DIAGNOSIS — I2699 Other pulmonary embolism without acute cor pulmonale: Secondary | ICD-10-CM | POA: Diagnosis not present

## 2016-09-26 DIAGNOSIS — Z7901 Long term (current) use of anticoagulants: Secondary | ICD-10-CM | POA: Diagnosis not present

## 2016-09-26 DIAGNOSIS — I82403 Acute embolism and thrombosis of unspecified deep veins of lower extremity, bilateral: Secondary | ICD-10-CM | POA: Diagnosis not present

## 2016-09-26 LAB — POCT INR: INR: 3.2

## 2016-09-27 ENCOUNTER — Other Ambulatory Visit: Payer: Self-pay

## 2016-09-27 NOTE — Patient Outreach (Signed)
[place] [facility]) Care Management  [place]  09/27/2016   Nicole Gibbs 27-Dec-1945 [id number]  Subjective: Telephone call to patient for every other month call.  She reports she is doing ok but her INR levels are off.  She states she goes back in two weeks for a check and hopes that it is below 3. Discussed with patient INR and variations.  She verbalized understanding.  Discussed with patient heart failure and when to notify physician. She verbalized understanding.  Discussed with patient possible case closure next month as she continues to meet her goals.  Patient in agreement.    Objective:   Encounter Medications:  Outpatient Encounter Prescriptions as of 09/27/2016  Medication Sig Note  . acetaminophen (TYLENOL) 325 MG tablet Take 650 mg by mouth every 6 (six) hours as needed for moderate pain.   . ferrous sulfate 325 (65 FE) MG tablet Take 1 tablet (325 mg total) by mouth 2 (two) times daily with a meal. (Patient taking differently: Take 325 mg by mouth daily with breakfast. ) 10/20/2015: Patient taking once a day.   . furosemide (LASIX) 40 MG tablet Take 1 tablet (40 mg total) by mouth 2 (two) times daily. (Patient taking differently: Take 20 mg by mouth 2 (two) times daily. )   . lisinopril (PRINIVIL,ZESTRIL) 2.5 MG tablet Take 1 tablet (2.5 mg total) by mouth daily.   . metoprolol (LOPRESSOR) 50 MG tablet Take 1 tablet (50 mg total) by mouth 2 (two) times daily.   . Multiple Vitamin (MULTIVITAMIN) tablet Take 1 tablet by mouth daily.   . nitroGLYCERIN (NITROSTAT) 0.4 MG SL tablet Place 1 tablet (0.4 mg total) under the tongue every 5 (five) minutes as needed for chest pain.   Marland Kitchen nystatin (MYCOSTATIN/NYSTOP) 100000 UNIT/GM POWD Apply to your groin TID   . potassium chloride SA (K-DUR,KLOR-CON) 20 MEQ tablet Take 2 tablets (40 mEq total) by mouth daily. (Patient taking differently: Take 20 mEq by mouth 2 (two) times daily. )   . warfarin (COUMADIN) 3 MG tablet TAKE 1 TO 1  AND 1/2 TABLETS BY MOUTH DAILY OR AS DIRECTED   . AMBULATORY NON FORMULARY MEDICATION Take 10 mg by mouth once. Medication Name: Verciguat 10mg  tablet daily ([place] Heart Failure Study)   . b complex vitamins capsule Take 1 capsule by mouth daily. (Patient not taking: Reported on 03/28/2016)   . zolpidem (AMBIEN) 5 MG tablet Take 1 tablet (5 mg total) by mouth at bedtime as needed for sleep. (Patient not taking: Reported on 03/28/2016) 07/29/2015: Medication ran out   No facility-administered encounter medications on file as of 09/27/2016.     Functional Status:  In your present state of health, do you have any difficulty performing the following activities: 10/20/2015  Hearing? N  Vision? N  Difficulty concentrating or making decisions? N  Walking or climbing stairs? Y  Dressing or bathing? N  Doing errands, shopping? Y  Preparing Food and eating ? N  Using the Toilet? N  In the past six months, have you accidently leaked urine? N  Do you have problems with loss of bowel control? N  Managing your Medications? N  Managing your Finances? N  Housekeeping or managing your Housekeeping? Y  Some recent data might be hidden    Fall/Depression Screening: Fall Risk  09/27/2016 08/01/2016 06/06/2016  Falls in the past year? No Yes Yes  Number falls in past yr: - - 2 or more  Injury with Fall? - - -  Risk Factor Category  - - -  Risk for fall due to : - - -  Follow up - - -   PHQ 2/9 Scores 09/27/2016 08/01/2016 06/06/2016 03/28/2016 02/26/2016 02/01/2016 11/11/2015  PHQ - 2 Score 0 0 0 0 0 0 0    Assessment: Patient meeting goals and will prepare for closure next month.   Plan:  [facility] CM Care Plan Problem Two     Most Recent Value  Care Plan Problem Two  Knowledge deficit regarding heart failure management  Role Documenting the Problem Two  [place] for Problem Two  Active  Interventions for Problem Two Long Term Goal   RN Health Coach reiterated with patient the importance  of daly weights, watching salt intake, and utilizing heart failure zone chart as a guide.   THN Long Term Goal (31-90) days  Patient will verbalize managing heart failure within the next 60 days  THN Long Term Goal Start Date  09/27/16 [facility] continued]     [place] will contact patient in the month of June for possible case closure and patient agrees to next outreach.  Jone Baseman, RN, MSN [place] [phone]

## 2016-10-14 ENCOUNTER — Ambulatory Visit (INDEPENDENT_AMBULATORY_CARE_PROVIDER_SITE_OTHER): Payer: Medicare Other | Admitting: Pharmacist Clinician (PhC)/ Clinical Pharmacy Specialist

## 2016-10-14 DIAGNOSIS — I2699 Other pulmonary embolism without acute cor pulmonale: Secondary | ICD-10-CM | POA: Diagnosis not present

## 2016-10-14 DIAGNOSIS — Z7901 Long term (current) use of anticoagulants: Secondary | ICD-10-CM | POA: Diagnosis not present

## 2016-10-14 DIAGNOSIS — I82403 Acute embolism and thrombosis of unspecified deep veins of lower extremity, bilateral: Secondary | ICD-10-CM

## 2016-10-14 LAB — POCT INR: INR: 2.9

## 2016-10-29 IMAGING — CR DG CHEST 1V PORT
1 series · 1 of 1 positions shown · non-contrast
Comparison: 05/09/2015

CLINICAL DATA: Anemia.  Shortness of breath, fatigue.

EXAM:
PORTABLE CHEST 1 VIEW

[AP]
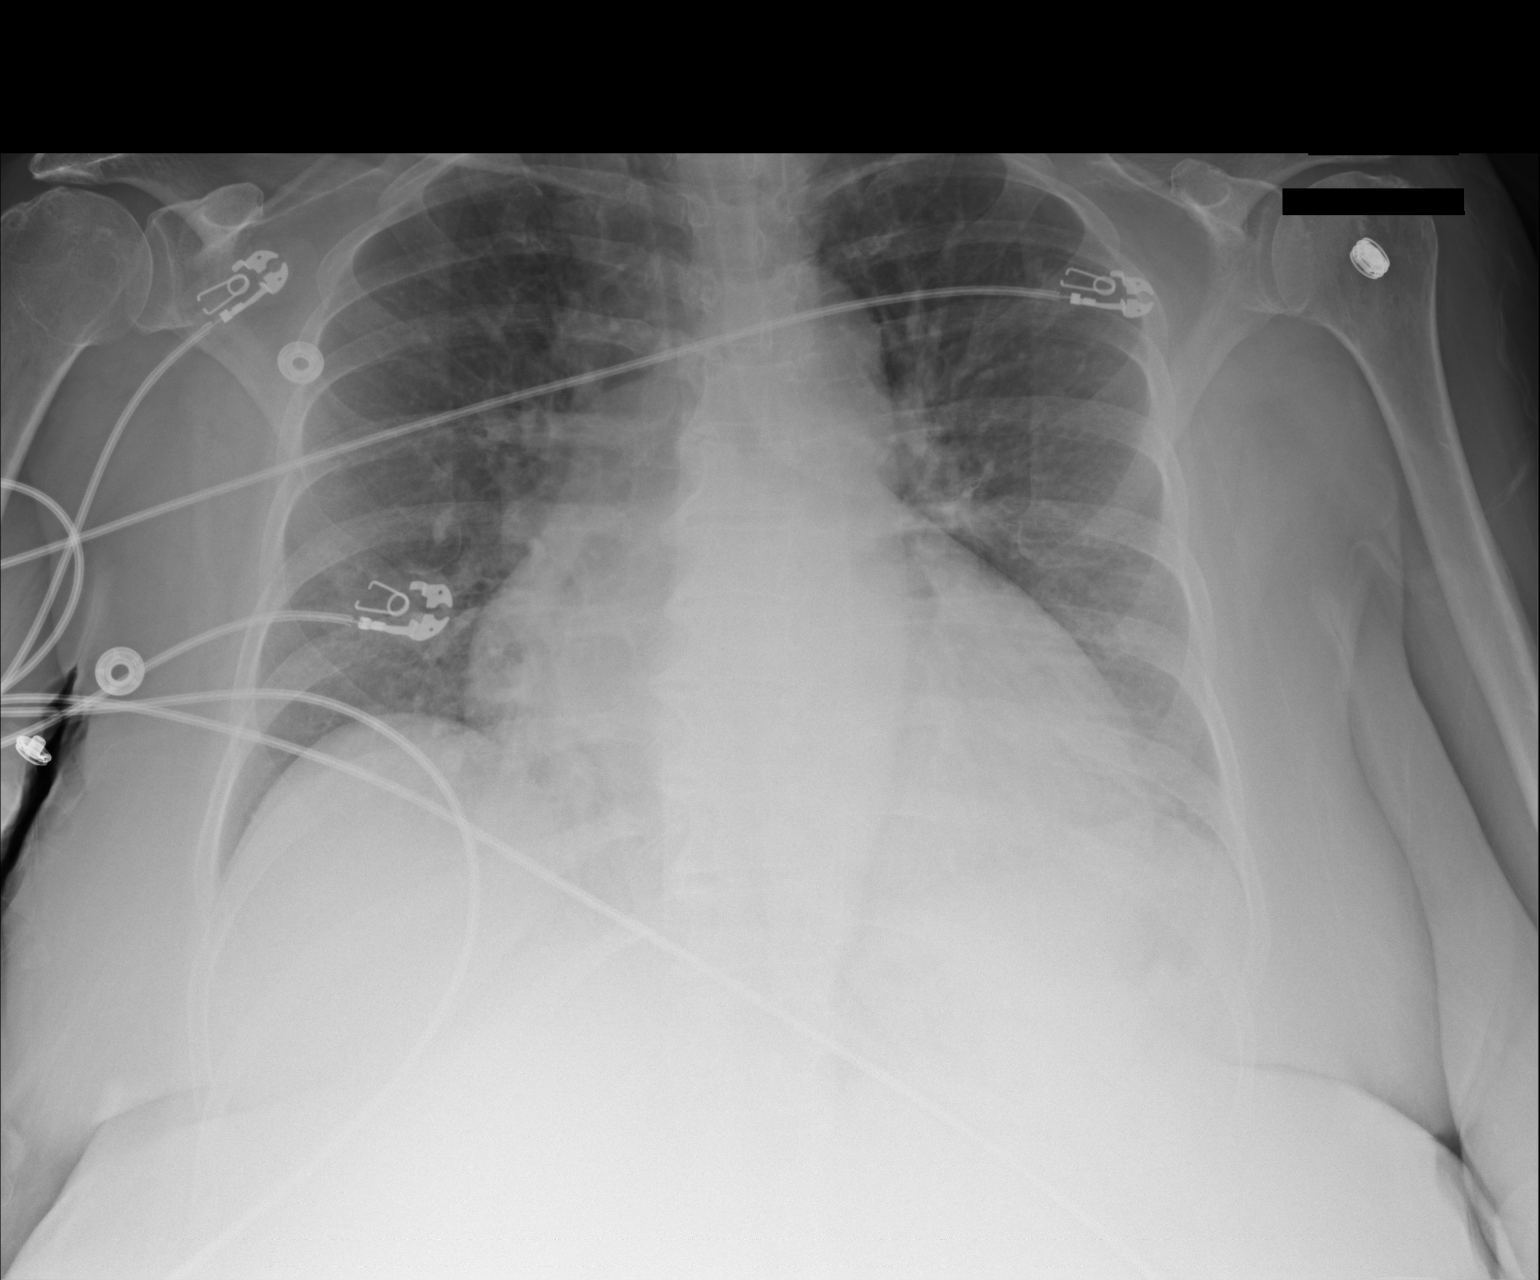

[1 of 1 positions shown; findings below may reference images not displayed]

FINDINGS: There is cardiac enlargement. Pulmonary vascular congestion is
noted. No pleural effusion or edema. No airspace consolidation
identified.
IMPRESSION: 1. Cardiac enlargement and pulmonary vascular congestion.

## 2016-10-31 ENCOUNTER — Other Ambulatory Visit: Payer: Self-pay

## 2016-10-31 DIAGNOSIS — I429 Cardiomyopathy, unspecified: Secondary | ICD-10-CM | POA: Diagnosis not present

## 2016-10-31 DIAGNOSIS — E049 Nontoxic goiter, unspecified: Secondary | ICD-10-CM | POA: Diagnosis not present

## 2016-10-31 DIAGNOSIS — I10 Essential (primary) hypertension: Secondary | ICD-10-CM | POA: Diagnosis not present

## 2016-10-31 IMAGING — CR DG CHEST 2V
2 series · 2 of 2 positions shown · non-contrast
Comparison: 07/03/2015 and 05/09/2015

CLINICAL DATA: Newly diagnosed heart failure on chronic
anticoagulation. Anemia. Exertional dyspnea 1 year.

EXAM:
CHEST  2 VIEW

[chest lat]
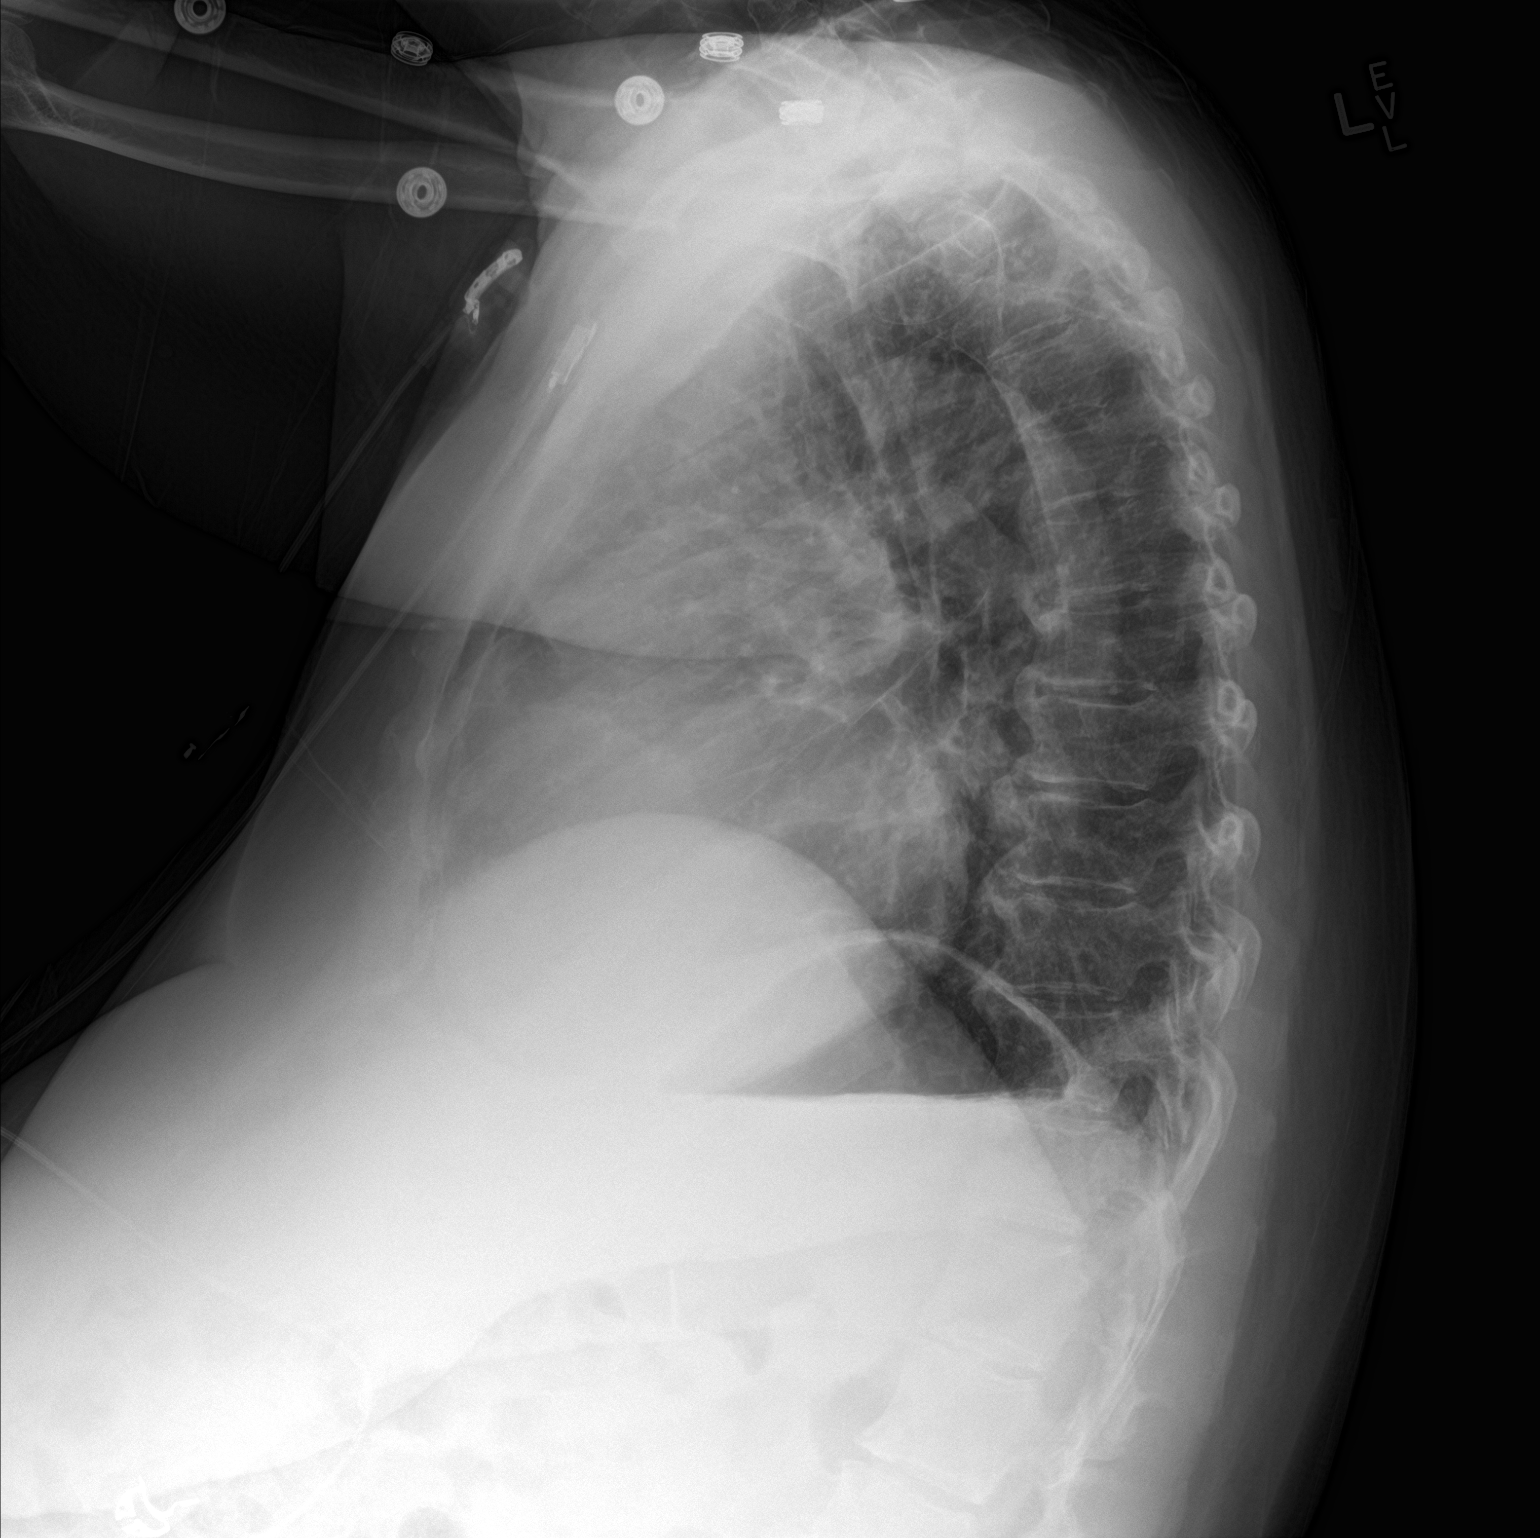

[chest pa]
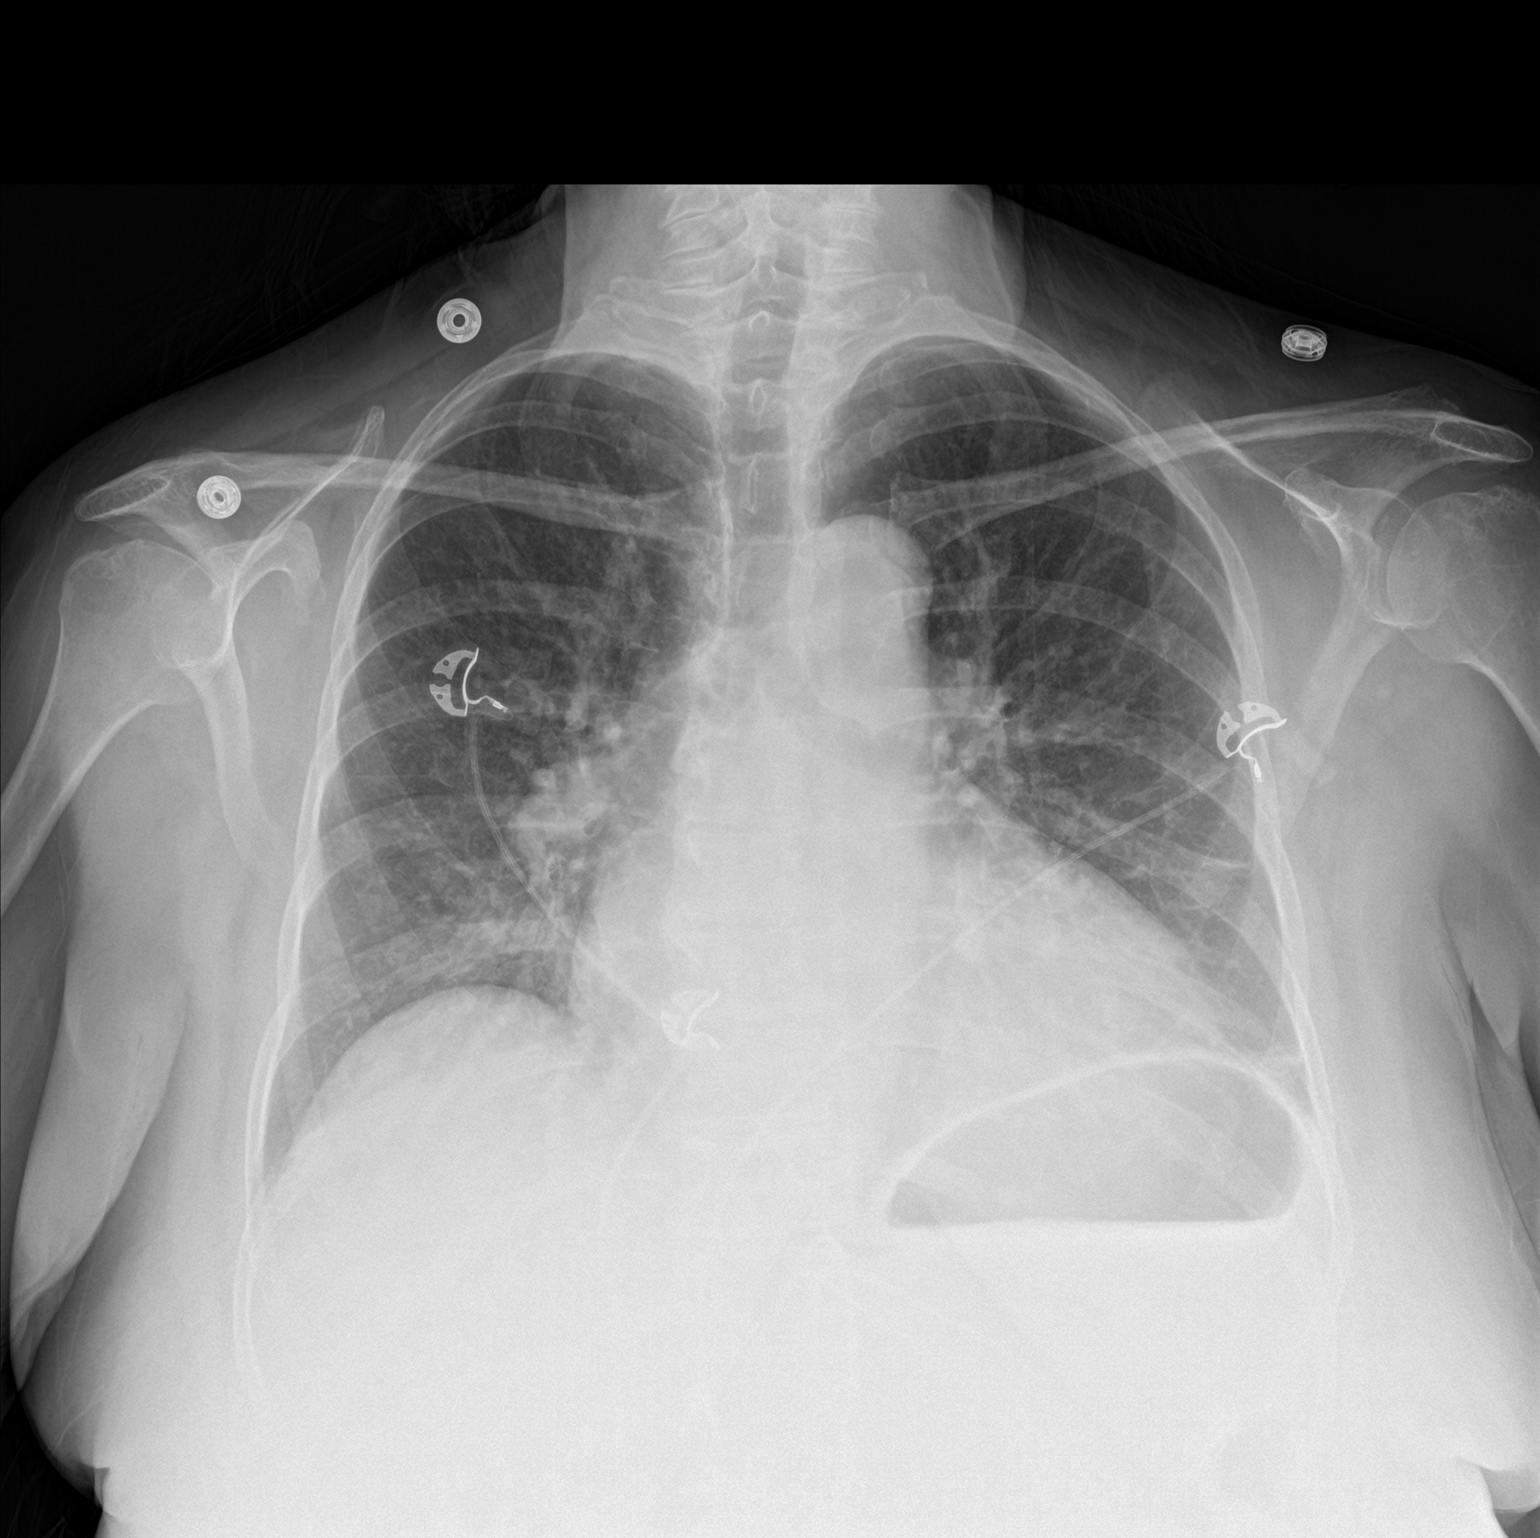

[2 of 2 positions shown; findings below may reference images not displayed]

FINDINGS: Lungs are somewhat hypoinflated without lobar consolidation or
effusion. Minimal linear atelectasis/ scarring over the left mid to
lower lung. Minimal prominence of the perihilar markings suggesting
mild vascular congestion. Mild stable cardiomegaly. Degenerative
change of the spine.
IMPRESSION: Mild stable cardiomegaly with minimal vascular congestion.

## 2016-10-31 NOTE — Patient Outreach (Signed)
[place] [facility]) Care Management  10/31/2016  Nicole Gibbs [birth year] [id number]   Telephone call to patient for monthly call and case closure. Female answered states she is not home.  Health coach contact information given.  Plan: RN Health Coach will attempt patient again in the month of June.   Jone Baseman, RN, MSN [place] [phone]

## 2016-11-07 DIAGNOSIS — E049 Nontoxic goiter, unspecified: Secondary | ICD-10-CM | POA: Diagnosis not present

## 2016-11-07 DIAGNOSIS — D509 Iron deficiency anemia, unspecified: Secondary | ICD-10-CM | POA: Diagnosis not present

## 2016-11-07 DIAGNOSIS — B379 Candidiasis, unspecified: Secondary | ICD-10-CM | POA: Diagnosis not present

## 2016-11-07 DIAGNOSIS — Z7901 Long term (current) use of anticoagulants: Secondary | ICD-10-CM | POA: Diagnosis not present

## 2016-11-07 DIAGNOSIS — I509 Heart failure, unspecified: Secondary | ICD-10-CM | POA: Diagnosis not present

## 2016-11-08 NOTE — Progress Notes (Signed)
Patient present for [place] Heart Failure Study Visit 6. States "the stress continues with my mother and husband's health poor health. I feel I am stress eating and I know my weight is gradually going up, about a pound a month". Patient denies SOB, or increased fatigue. Patient's LE's are unchanged. Patient does not exercise but is still thinking about [place] returned demonstrating excellent study medication compliance. Verbalizes compliance with her prescribed medication except she "struggles" to take lasix as it "makes her run to the bathroom." Vital signs WNL's. She is doing well and is not volume overloaded on exam. Labs performed for study visit. She is ambulating slowly with a cane, again I encouraged her to become more active. IP dispensed. Very appreciation of care.

## 2016-11-09 ENCOUNTER — Other Ambulatory Visit: Payer: Self-pay

## 2016-11-09 NOTE — Patient Outreach (Signed)
[place] [facility]) Care Management  11/09/2016  Nicole Gibbs 1946-02-27 [id number]   Telephone call to patient for case closure. Patient reports seeing primary care on Monday and visit went well. She reports that her weight is up but physician was not concerned.  Discussed with patient weight gain in relation to heart failure. Patient reports weight gain has not been sudden.  However, patient has had some stressors and knows she needs to watch her diet. Patient however reports that she has tried to limit her fluid intake.  Patient shares that she is also stressed as her husband and mother are both on Hospice and she is trying to deal with both.  Patient ok with case closure at this time as she has met her goals. Advised patient she could call if she had any questions or concerns.  She verbalized understanding.   Plan: RN Health Coach will send physician closure letter.  RN Health Coach will notify care management assistant of case closure.  Jone Baseman, RN, MSN [place] [phone]

## 2016-11-11 ENCOUNTER — Ambulatory Visit (INDEPENDENT_AMBULATORY_CARE_PROVIDER_SITE_OTHER): Payer: Medicare Other | Admitting: Pharmacist

## 2016-11-11 DIAGNOSIS — Z7901 Long term (current) use of anticoagulants: Secondary | ICD-10-CM | POA: Diagnosis not present

## 2016-11-11 LAB — POCT INR: INR: 2.5

## 2016-11-14 ENCOUNTER — Encounter: Payer: Self-pay | Admitting: *Deleted

## 2016-11-14 DIAGNOSIS — Z006 Encounter for examination for normal comparison and control in clinical research program: Secondary | ICD-10-CM
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
## 2016-12-09 ENCOUNTER — Ambulatory Visit (INDEPENDENT_AMBULATORY_CARE_PROVIDER_SITE_OTHER): Payer: Medicare Other | Admitting: Pharmacist Clinician (PhC)/ Clinical Pharmacy Specialist

## 2016-12-09 DIAGNOSIS — Z7901 Long term (current) use of anticoagulants: Secondary | ICD-10-CM | POA: Diagnosis not present

## 2016-12-09 DIAGNOSIS — I82403 Acute embolism and thrombosis of unspecified deep veins of lower extremity, bilateral: Secondary | ICD-10-CM | POA: Diagnosis not present

## 2016-12-09 DIAGNOSIS — I2699 Other pulmonary embolism without acute cor pulmonale: Secondary | ICD-10-CM | POA: Diagnosis not present

## 2016-12-09 LAB — POCT INR: INR: 2

## 2016-12-20 ENCOUNTER — Other Ambulatory Visit: Payer: Self-pay | Admitting: Cardiovascular Disease

## 2016-12-21 IMAGING — US US SOFT TISSUE HEAD/NECK
1 series · 13 of 25 positions shown · non-contrast
Comparison: 02/25/2015, 08/02/2013, 08/19/2010 biopsy performed
10/01/2013 of [DATE] thyroid nodules.

CLINICAL DATA: 69-year-old female with a history of thyroid
nodules.

Previous biopsy of right upper lobe right isthmic and left lower
lobe lesions.
EXAM:
THYROID ULTRASOUND
TECHNIQUE: Ultrasound examination of the thyroid gland and adjacent soft
tissues was performed.

[Series 1: us soft tissue head/neck · 0.10mm/px · 13 of 54 slices shown]
[im 1/54]
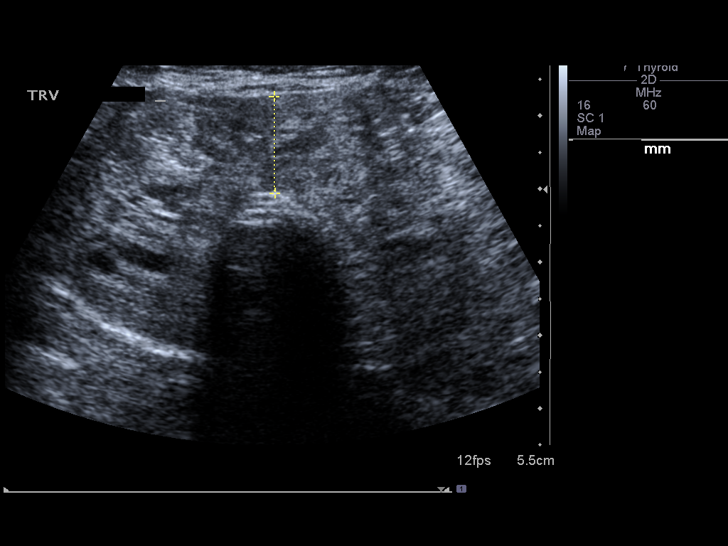
[im 5/54]
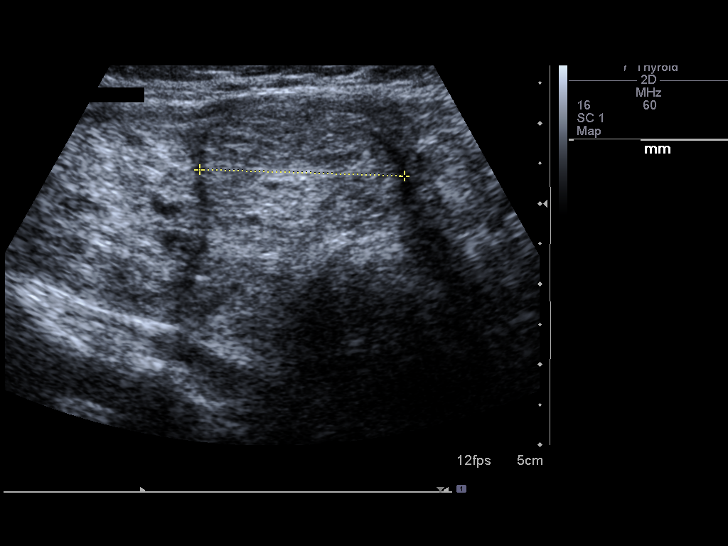
[im 9/54]
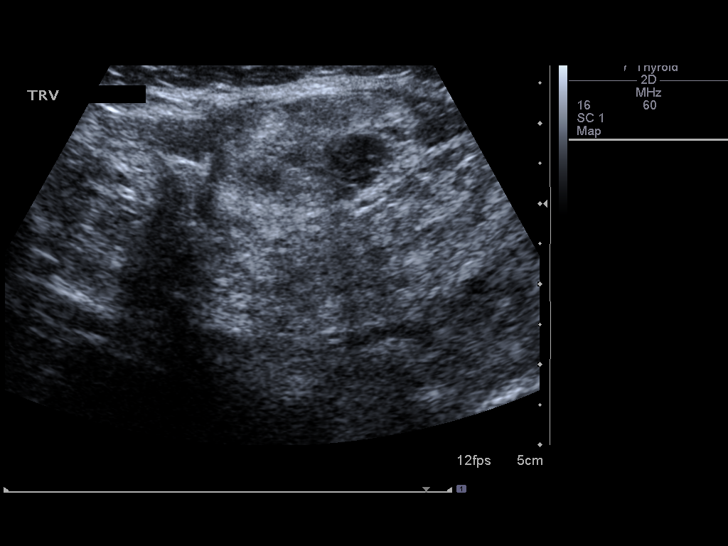
[im 14/54]
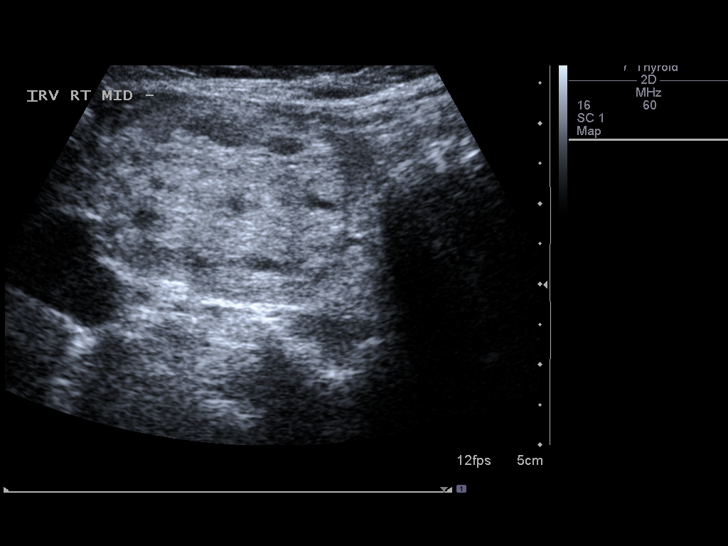
[im 18/54]
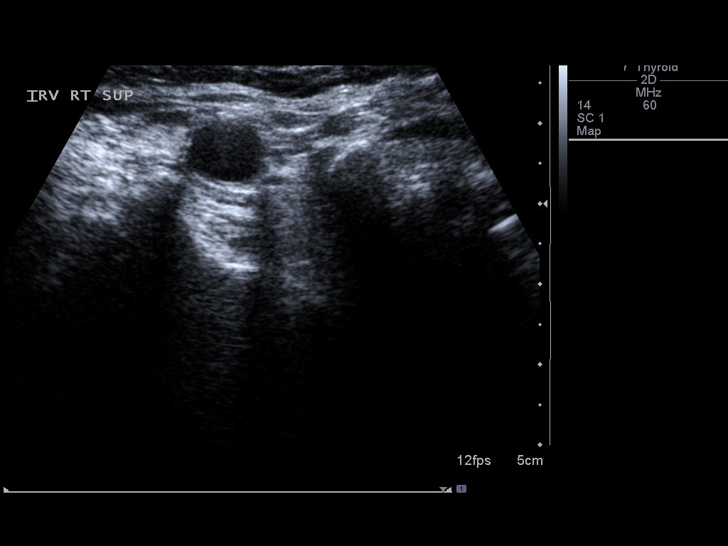
[im 23/54]
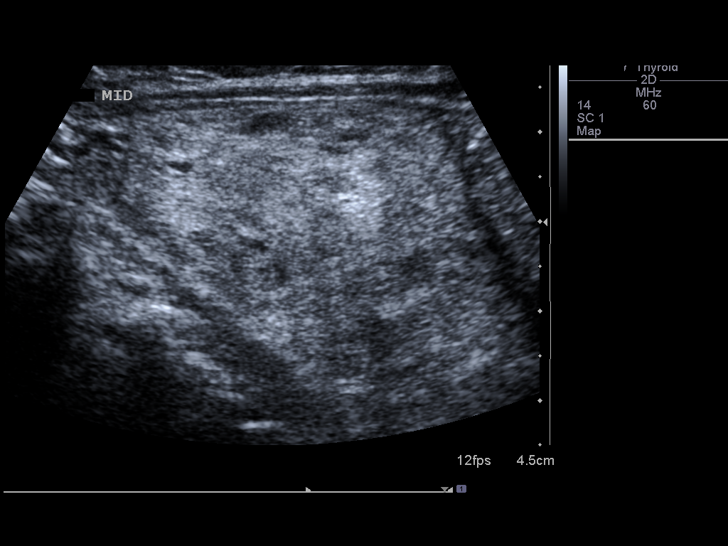
[im 27/54]
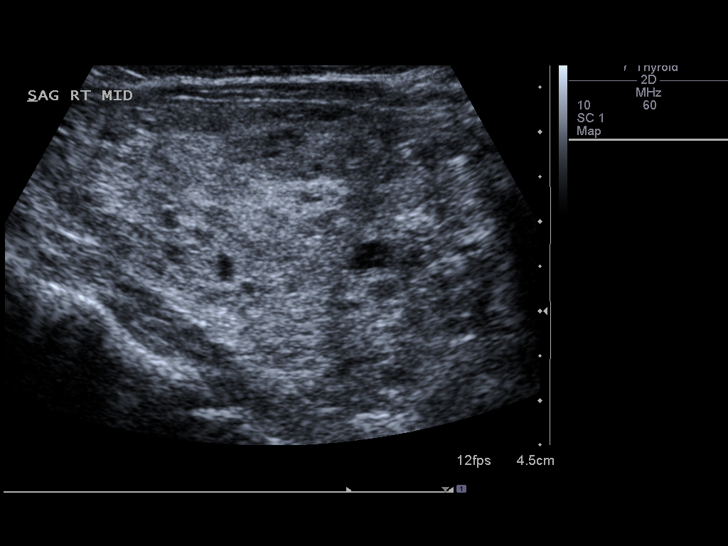
[im 31/54]
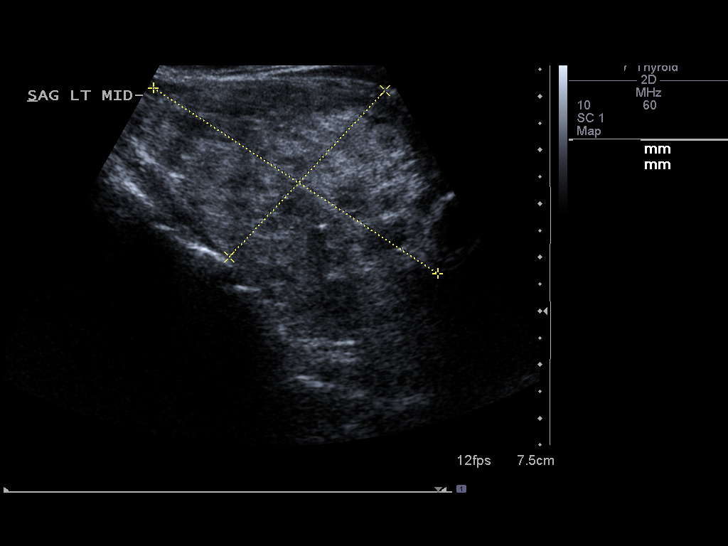
[im 36/54]
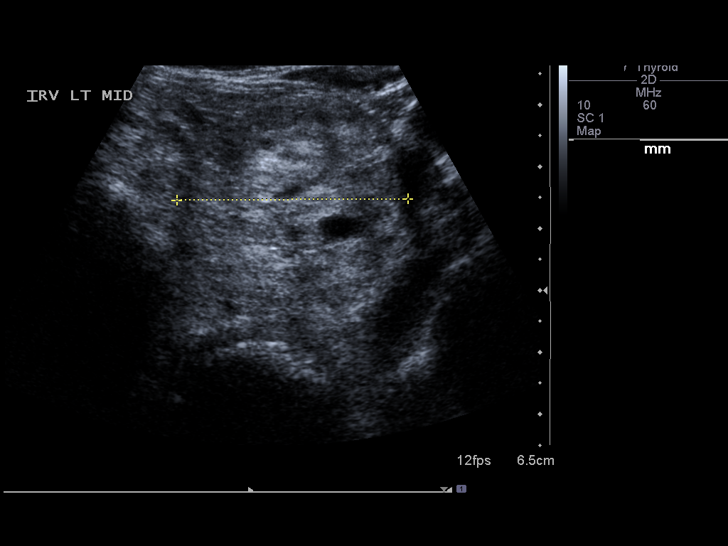
[im 40/54]
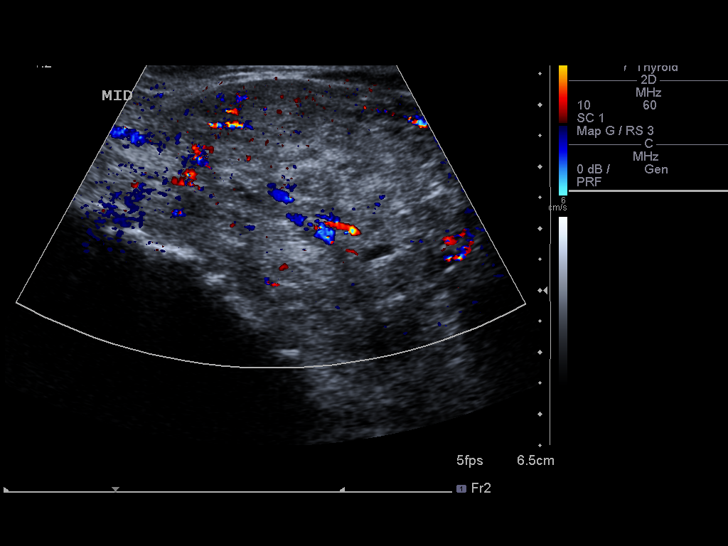
[im 45/54]
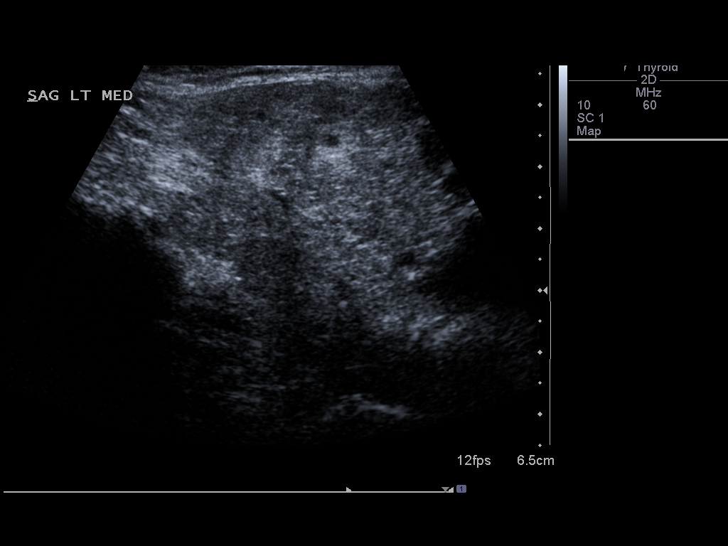
[im 49/54]
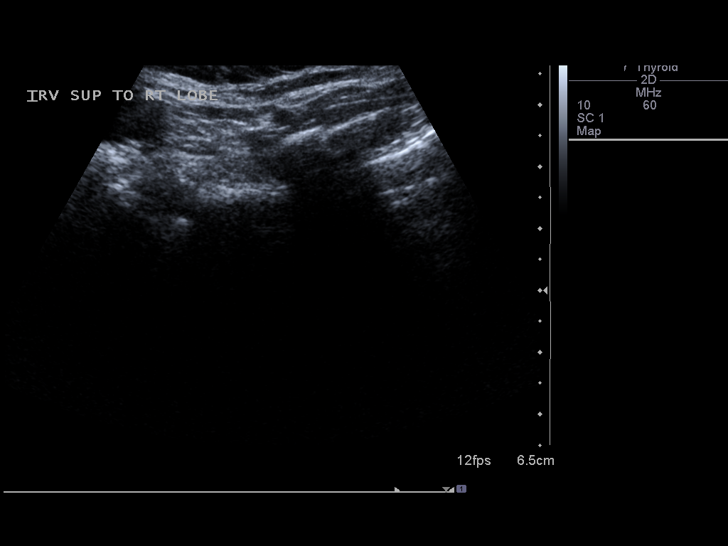
[im 54/54]
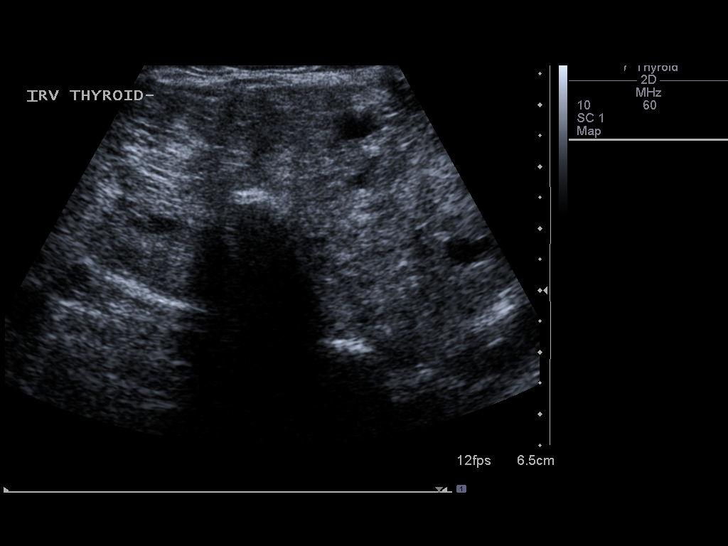

[13 of 25 positions shown; findings below may reference images not displayed]

FINDINGS: Right thyroid lobe

Measurements: 5.9 cm x 3.4 cm x 3.9 cm. Heterogeneous appearance of
right thyroid tissues with pseudo nodular appearance. No significant
internal flow.

Inferior right thyroid nodule measures 3.1 cm x 2.2 cm x 4.0 cm.
Nodule the superior right thyroid not identified on the current
study.

Left thyroid lobe

Measurements: 6.3 cm x 4.3 cm x 4.1 cm. Heterogeneous appearance of
left thyroid tissue without significant internal flow.

Dominant nodule measures 3.7 cm x 4.7 cm x 4.1 cm.

Isthmus

Thickness: 1.3 cm. Heterogeneous appearance of isthmic thyroid
tissue.

Right-sided nodule/pseudo nodule of the isthmus measures 2.3 cm x
1.7 cm x 2.6 cm with solid features.

Left-sided thyroid nodule/pseudo nodule measures 2.7 cm x 3.6 cm x
2.1 cm.

Lymphadenopathy

None visualized.
IMPRESSION: Multinodular thyroid/pseudo nodular appearance. Inferior nodule
again measures slightly larger than the comparison studies over
time, and meets criteria for biopsy.

Previous right superior thyroid nodule is not identified on the
current study.

Prior isthmic nodule and left-sided thyroid nodule have been
previously biopsied. Recommend correlation biopsy results.

## 2017-01-04 IMAGING — US US THYROID BIOPSY
1 series · 13 of 14 positions shown · non-contrast
Comparison: Prior ultrasound-guided thyroid biopsies September 20, 2013
and recent thyroid ultrasound Saturday August, 2015.

MEDICATIONS:
None

COMPLICATIONS:
None immediate.

INDICATION: Indeterminate right thyroid nodule.

EXAM:
ULTRASOUND GUIDED FINE NEEDLE ASPIRATION OF INDETERMINATE RIGHT
THYROID NODULE
TECHNIQUE: Informed written consent was obtained from the patient after a
discussion of the risks, benefits and alternatives to treatment.
Questions regarding the procedure were encouraged and answered. A
timeout was performed prior to the initiation of the procedure.

[Series 1: us thyroid biopsy · 0.08mm/px · 14 acquisitions, 13 frames shown]
[im 1/14]
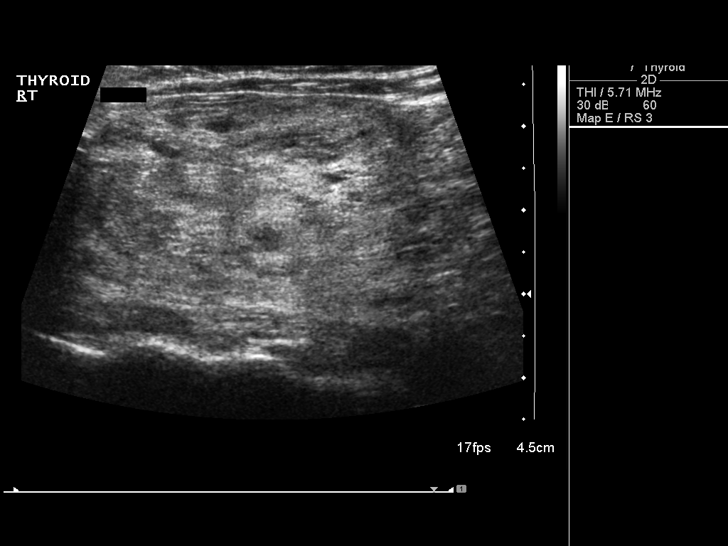
[im 2/14]
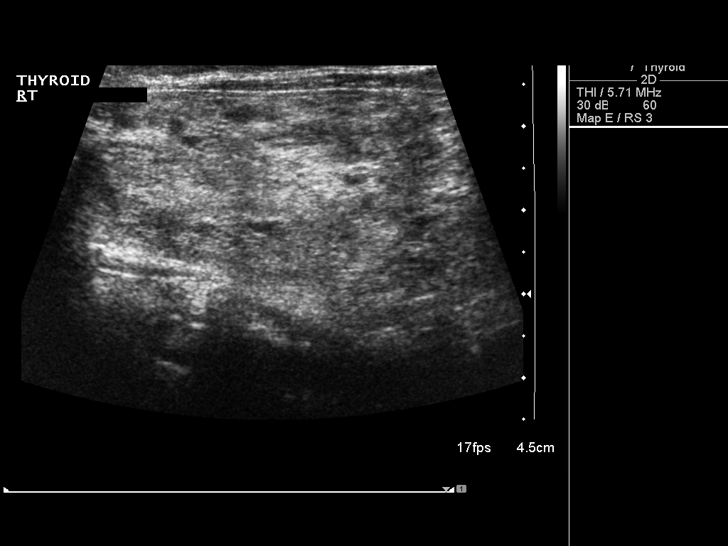
[im 3/14]
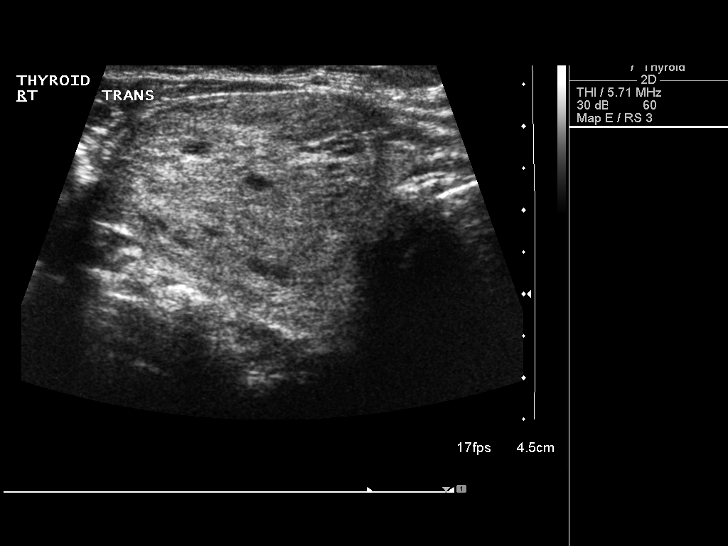
[im 4/14]
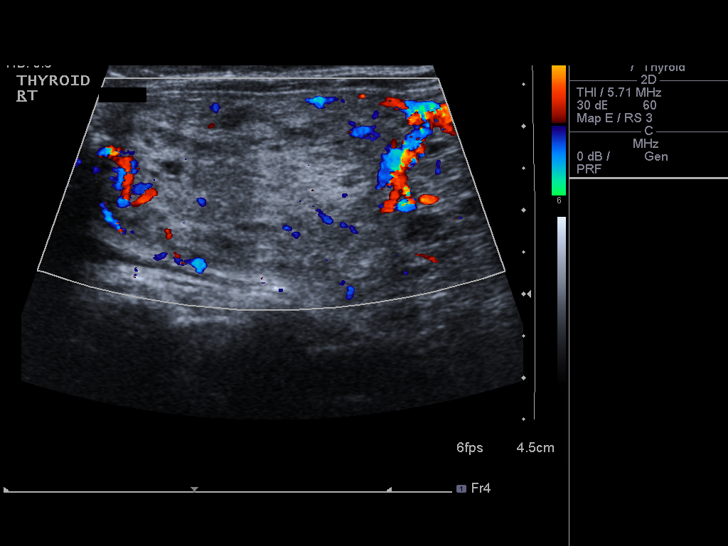
[im 5/14]
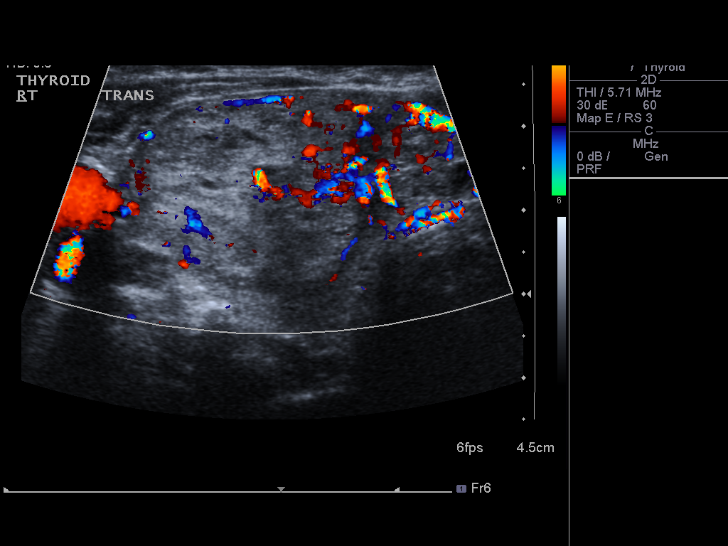
[im 6/14]
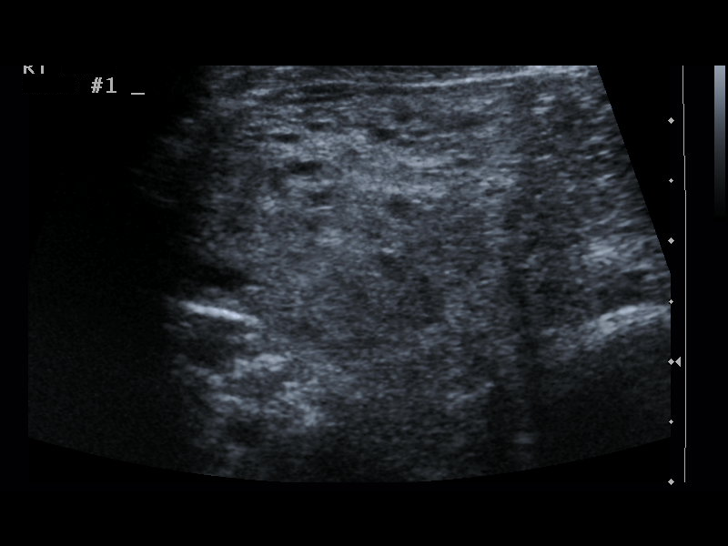
[im 8/14]
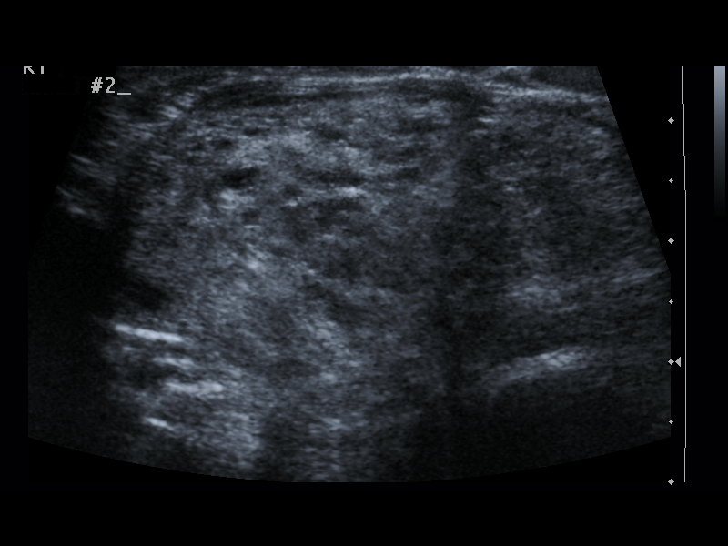
[im 9/14]
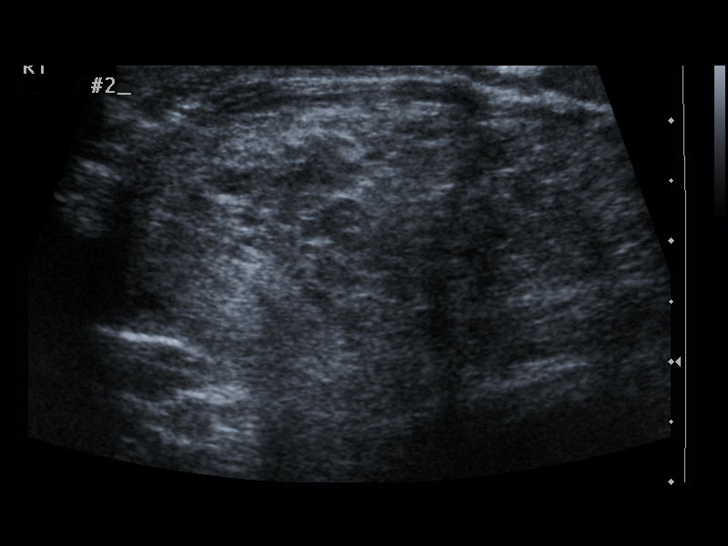
[im 10/14]
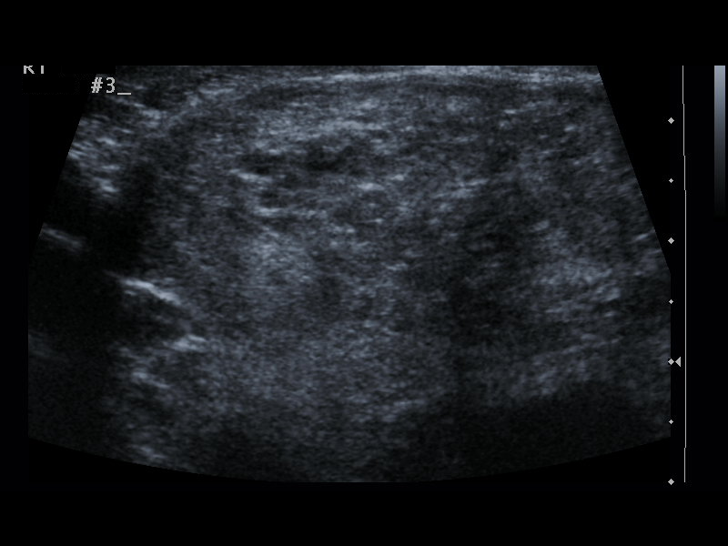
[im 11/14]
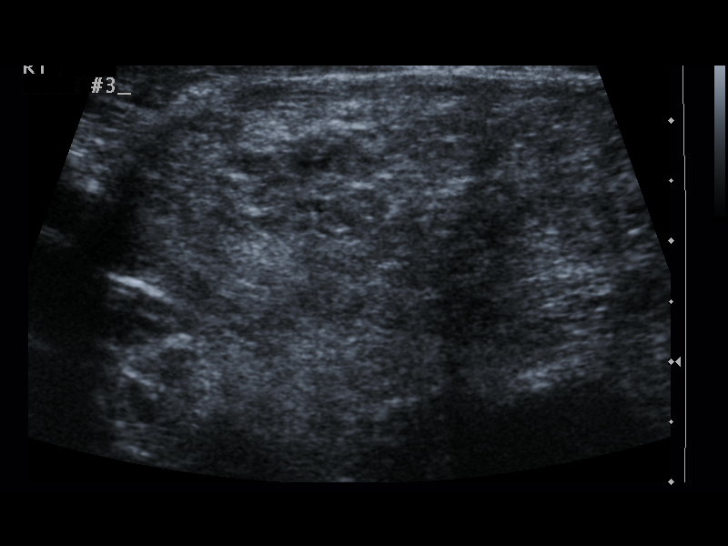
[im 12/14]
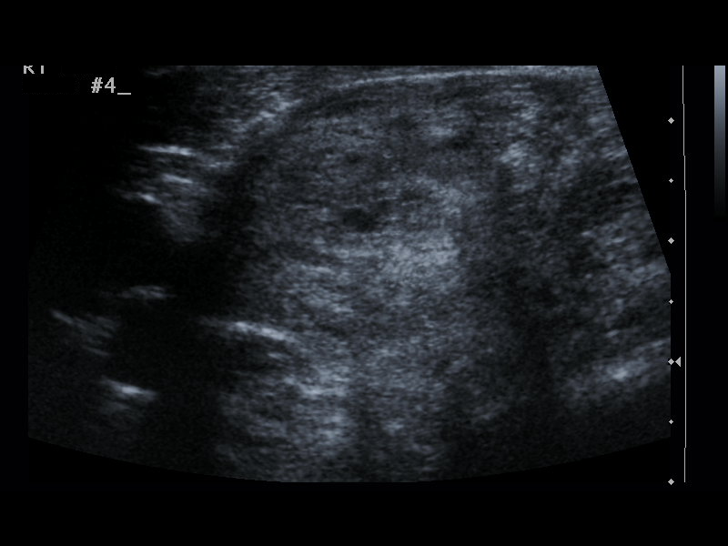
[im 13/14]
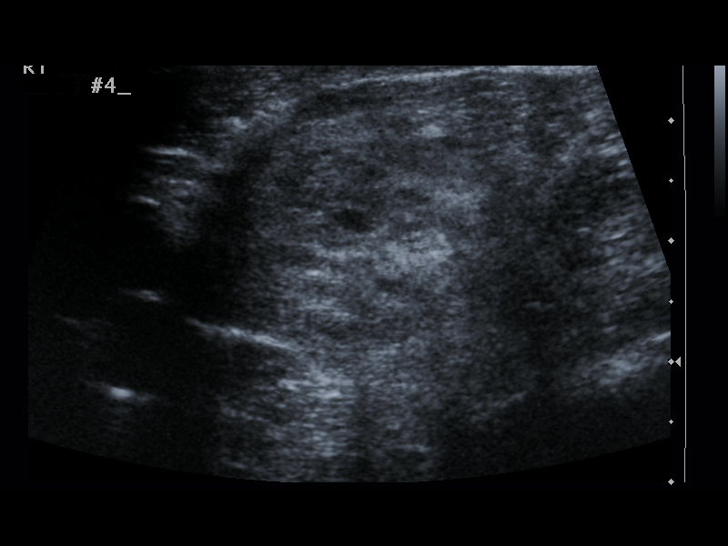
[im 14/14]
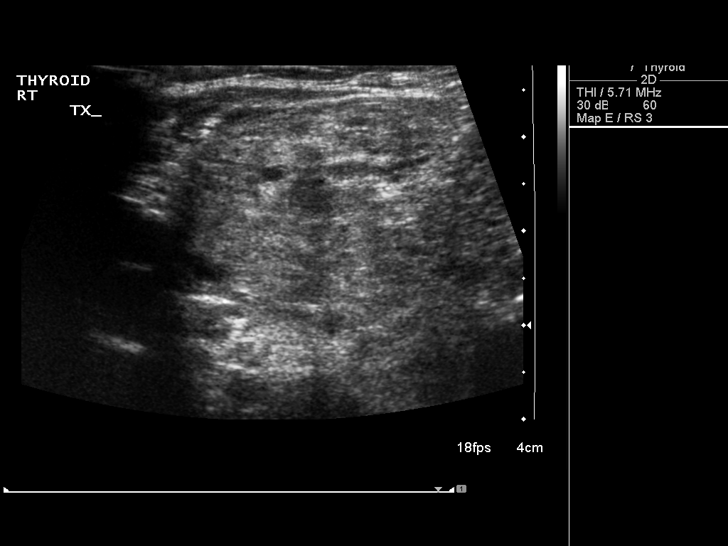

[13 of 14 positions shown; findings below may reference images not displayed]

Pre-procedural ultrasound scanning demonstrated right mid thyroid
predominantly solid nodule seen on diagnostic thyroid ultrasound
measuring approximately 3.1 x 2.2 x 4.0 cm.

The procedure was planned. The neck was prepped in the usual sterile
fashion, and a sterile drape was applied covering the operative
field. A timeout was performed prior to the initiation of the
procedure. Local anesthesia was provided with 1% lidocaine.

Under direct ultrasound guidance, 4 FNA biopsies were performed of
the right thyroid nodule with a 25 gauge needle. The samples were
prepared and submitted to pathology.

Limited post procedural scanning was negative for hematoma or
additional complication. Dressings were placed. The patient
tolerated the above procedures procedure well without immediate
postprocedural complication.
IMPRESSION: Technically successful ultrasound guided fine needle aspiration of
right thyroid nodule as described above.

## 2017-01-20 ENCOUNTER — Ambulatory Visit (INDEPENDENT_AMBULATORY_CARE_PROVIDER_SITE_OTHER): Payer: Medicare Other | Admitting: Pharmacist

## 2017-01-20 DIAGNOSIS — Z7901 Long term (current) use of anticoagulants: Secondary | ICD-10-CM | POA: Diagnosis not present

## 2017-01-20 DIAGNOSIS — I82493 Acute embolism and thrombosis of other specified deep vein of lower extremity, bilateral: Secondary | ICD-10-CM | POA: Diagnosis not present

## 2017-01-20 DIAGNOSIS — I2699 Other pulmonary embolism without acute cor pulmonale: Secondary | ICD-10-CM | POA: Diagnosis not present

## 2017-01-20 DIAGNOSIS — I82403 Acute embolism and thrombosis of unspecified deep veins of lower extremity, bilateral: Secondary | ICD-10-CM

## 2017-01-20 LAB — POCT INR: INR: 2.1

## 2017-02-14 ENCOUNTER — Telehealth: Payer: Self-pay

## 2017-02-14 NOTE — Telephone Encounter (Signed)
Call to patient, scheduled Nicole Gibbs Visit 8 for Oct 16th, 11am. Very pleasant.

## 2017-02-28 ENCOUNTER — Telehealth: Payer: Self-pay

## 2017-02-28 NOTE — Telephone Encounter (Signed)
Call from patient stating she needs to reschedule as her mother passed away on August 16, 2022. Condolences given and rescheduled for 10-30 1pm.

## 2017-03-02 DIAGNOSIS — E049 Nontoxic goiter, unspecified: Secondary | ICD-10-CM | POA: Diagnosis not present

## 2017-03-03 ENCOUNTER — Ambulatory Visit (INDEPENDENT_AMBULATORY_CARE_PROVIDER_SITE_OTHER): Payer: Medicare Other | Admitting: Pharmacist Clinician (PhC)/ Clinical Pharmacy Specialist

## 2017-03-03 DIAGNOSIS — I82403 Acute embolism and thrombosis of unspecified deep veins of lower extremity, bilateral: Secondary | ICD-10-CM

## 2017-03-03 DIAGNOSIS — Z7901 Long term (current) use of anticoagulants: Secondary | ICD-10-CM | POA: Diagnosis not present

## 2017-03-03 DIAGNOSIS — I2699 Other pulmonary embolism without acute cor pulmonale: Secondary | ICD-10-CM | POA: Diagnosis not present

## 2017-03-03 LAB — POCT INR: INR: 2.5

## 2017-03-07 ENCOUNTER — Encounter: Payer: Self-pay | Admitting: *Deleted

## 2017-03-07 DIAGNOSIS — Z006 Encounter for examination for normal comparison and control in clinical research program: Secondary | ICD-10-CM

## 2017-03-07 NOTE — Progress Notes (Signed)
RESEARCH ENCOUNTER  Patient ID: Nicole Gibbs  DOB: 1945/06/18  [name] presented to the [facility] for Visit 8 of the [employer].  No signs/symptoms of ACS since the last visit. Subjects blood pressure WNL's for maintaining product dose.  Subject compliant with IP, IP returned, and additional IP dispensed.  Bottle of IP from previous visit also returned.  Subject's mother passed away and spouse still living with hospice care.  Patient verbalizes to call with any questions regarding the study. She will call her cardiologist with any s/sx of heart failure exacerbation. Next study visit in 16 weeks.

## 2017-03-09 DIAGNOSIS — E049 Nontoxic goiter, unspecified: Secondary | ICD-10-CM | POA: Diagnosis not present

## 2017-03-09 DIAGNOSIS — E0590 Thyrotoxicosis, unspecified without thyrotoxic crisis or storm: Secondary | ICD-10-CM | POA: Diagnosis not present

## 2017-04-03 ENCOUNTER — Other Ambulatory Visit: Payer: Self-pay | Admitting: Cardiovascular Disease

## 2017-04-14 ENCOUNTER — Ambulatory Visit (INDEPENDENT_AMBULATORY_CARE_PROVIDER_SITE_OTHER): Payer: Medicare Other | Admitting: Pharmacist Clinician (PhC)/ Clinical Pharmacy Specialist

## 2017-04-14 DIAGNOSIS — I2699 Other pulmonary embolism without acute cor pulmonale: Secondary | ICD-10-CM | POA: Diagnosis not present

## 2017-04-14 DIAGNOSIS — I824Z3 Acute embolism and thrombosis of unspecified deep veins of distal lower extremity, bilateral: Secondary | ICD-10-CM

## 2017-04-14 DIAGNOSIS — Z7901 Long term (current) use of anticoagulants: Secondary | ICD-10-CM | POA: Diagnosis not present

## 2017-04-14 DIAGNOSIS — I82403 Acute embolism and thrombosis of unspecified deep veins of lower extremity, bilateral: Secondary | ICD-10-CM | POA: Diagnosis not present

## 2017-04-14 LAB — POCT INR: INR: 3.5

## 2017-05-05 ENCOUNTER — Ambulatory Visit (INDEPENDENT_AMBULATORY_CARE_PROVIDER_SITE_OTHER): Payer: Medicare Other | Admitting: Pharmacist Clinician (PhC)/ Clinical Pharmacy Specialist

## 2017-05-05 DIAGNOSIS — I2699 Other pulmonary embolism without acute cor pulmonale: Secondary | ICD-10-CM | POA: Diagnosis not present

## 2017-05-05 DIAGNOSIS — I82401 Acute embolism and thrombosis of unspecified deep veins of right lower extremity: Secondary | ICD-10-CM

## 2017-05-05 DIAGNOSIS — I82403 Acute embolism and thrombosis of unspecified deep veins of lower extremity, bilateral: Secondary | ICD-10-CM

## 2017-05-05 DIAGNOSIS — Z7901 Long term (current) use of anticoagulants: Secondary | ICD-10-CM | POA: Diagnosis not present

## 2017-05-05 LAB — POCT INR: INR: 2.8

## 2017-06-02 ENCOUNTER — Ambulatory Visit (INDEPENDENT_AMBULATORY_CARE_PROVIDER_SITE_OTHER): Payer: Medicare Other | Admitting: Pharmacist Clinician (PhC)/ Clinical Pharmacy Specialist

## 2017-06-02 DIAGNOSIS — I2699 Other pulmonary embolism without acute cor pulmonale: Secondary | ICD-10-CM

## 2017-06-02 DIAGNOSIS — Z7901 Long term (current) use of anticoagulants: Secondary | ICD-10-CM

## 2017-06-02 DIAGNOSIS — I82403 Acute embolism and thrombosis of unspecified deep veins of lower extremity, bilateral: Secondary | ICD-10-CM

## 2017-06-02 LAB — POCT INR: INR: 3.2

## 2017-06-04 DIAGNOSIS — Z23 Encounter for immunization: Secondary | ICD-10-CM | POA: Diagnosis not present

## 2017-06-21 ENCOUNTER — Encounter: Payer: Medicare Other | Admitting: *Deleted

## 2017-06-21 VITALS — BP 139/76 | HR 66 | Wt 227.2 lb

## 2017-06-21 DIAGNOSIS — Z006 Encounter for examination for normal comparison and control in clinical research program: Secondary | ICD-10-CM

## 2017-06-21 NOTE — Progress Notes (Signed)
RESEARCH ENCOUNTER  Patient ID: Nicole Gibbs  DOB: June 22, 1945  Nicole Gibbs presented to the [facility] for Visit 9 of the [place].  No signs/symptoms of ACS, dizziness, or syncope since the last visit. In addition, no nitrate use or cardiac procedures since the last visit.  Subject states she is compliant with IP and states she takes "a pill every day."  Subject states that she did leave a bottle of IP at home [phone]).  Furthermore, she is still dealing with situation anxiety surrounding her spouse being in [place].  Overall, subject in good spirits.  [facility].

## 2017-06-26 ENCOUNTER — Encounter: Payer: Self-pay | Admitting: *Deleted

## 2017-06-26 ENCOUNTER — Other Ambulatory Visit: Payer: Self-pay | Admitting: *Deleted

## 2017-06-26 ENCOUNTER — Ambulatory Visit (INDEPENDENT_AMBULATORY_CARE_PROVIDER_SITE_OTHER): Payer: Medicare Other | Admitting: Pharmacist Clinician (PhC)/ Clinical Pharmacy Specialist

## 2017-06-26 DIAGNOSIS — Z7901 Long term (current) use of anticoagulants: Secondary | ICD-10-CM

## 2017-06-26 DIAGNOSIS — Z006 Encounter for examination for normal comparison and control in clinical research program: Secondary | ICD-10-CM

## 2017-06-26 DIAGNOSIS — I2699 Other pulmonary embolism without acute cor pulmonale: Secondary | ICD-10-CM | POA: Diagnosis not present

## 2017-06-26 DIAGNOSIS — I82403 Acute embolism and thrombosis of unspecified deep veins of lower extremity, bilateral: Secondary | ICD-10-CM

## 2017-06-26 DIAGNOSIS — I82401 Acute embolism and thrombosis of unspecified deep veins of right lower extremity: Secondary | ICD-10-CM

## 2017-06-26 LAB — POCT INR: INR: 2.5

## 2017-06-26 MED ORDER — WARFARIN SODIUM 3 MG PO TABS: ORAL_TABLET | ORAL | 0 refills | Status: DC

## 2017-06-26 MED ORDER — METOPROLOL SUCCINATE ER 100 MG PO TB24: 100.0000 mg | ORAL_TABLET | Freq: Every day | ORAL | 3 refills | Status: DC

## 2017-06-26 NOTE — Progress Notes (Signed)
Opened in error

## 2017-07-24 ENCOUNTER — Ambulatory Visit (INDEPENDENT_AMBULATORY_CARE_PROVIDER_SITE_OTHER): Payer: Medicare Other | Admitting: Pharmacist

## 2017-07-24 DIAGNOSIS — I82403 Acute embolism and thrombosis of unspecified deep veins of lower extremity, bilateral: Secondary | ICD-10-CM | POA: Diagnosis not present

## 2017-07-24 DIAGNOSIS — Z7901 Long term (current) use of anticoagulants: Secondary | ICD-10-CM | POA: Diagnosis not present

## 2017-07-24 DIAGNOSIS — I2699 Other pulmonary embolism without acute cor pulmonale: Secondary | ICD-10-CM | POA: Diagnosis not present

## 2017-07-24 LAB — POCT INR: INR: 2.1

## 2017-08-21 DIAGNOSIS — H538 Other visual disturbances: Secondary | ICD-10-CM | POA: Diagnosis not present

## 2017-08-21 DIAGNOSIS — E669 Obesity, unspecified: Secondary | ICD-10-CM | POA: Diagnosis not present

## 2017-08-21 DIAGNOSIS — G4700 Insomnia, unspecified: Secondary | ICD-10-CM | POA: Diagnosis not present

## 2017-08-21 DIAGNOSIS — I10 Essential (primary) hypertension: Secondary | ICD-10-CM | POA: Diagnosis not present

## 2017-08-21 DIAGNOSIS — Z0000 Encounter for general adult medical examination without abnormal findings: Secondary | ICD-10-CM | POA: Diagnosis not present

## 2017-08-21 DIAGNOSIS — N939 Abnormal uterine and vaginal bleeding, unspecified: Secondary | ICD-10-CM | POA: Diagnosis not present

## 2017-08-21 DIAGNOSIS — Z79899 Other long term (current) drug therapy: Secondary | ICD-10-CM | POA: Diagnosis not present

## 2017-08-21 DIAGNOSIS — B379 Candidiasis, unspecified: Secondary | ICD-10-CM | POA: Diagnosis not present

## 2017-09-04 DIAGNOSIS — H25042 Posterior subcapsular polar age-related cataract, left eye: Secondary | ICD-10-CM | POA: Diagnosis not present

## 2017-09-04 DIAGNOSIS — H25013 Cortical age-related cataract, bilateral: Secondary | ICD-10-CM | POA: Diagnosis not present

## 2017-09-04 DIAGNOSIS — H35033 Hypertensive retinopathy, bilateral: Secondary | ICD-10-CM | POA: Diagnosis not present

## 2017-09-04 DIAGNOSIS — H2513 Age-related nuclear cataract, bilateral: Secondary | ICD-10-CM | POA: Diagnosis not present

## 2017-09-04 DIAGNOSIS — H25032 Anterior subcapsular polar age-related cataract, left eye: Secondary | ICD-10-CM | POA: Diagnosis not present

## 2017-10-05 DIAGNOSIS — N950 Postmenopausal bleeding: Secondary | ICD-10-CM | POA: Diagnosis not present

## 2017-10-05 DIAGNOSIS — R9389 Abnormal findings on diagnostic imaging of other specified body structures: Secondary | ICD-10-CM | POA: Diagnosis not present

## 2017-10-11 ENCOUNTER — Encounter: Payer: Medicare Other | Admitting: *Deleted

## 2017-10-11 ENCOUNTER — Other Ambulatory Visit: Payer: Self-pay | Admitting: Cardiovascular Disease

## 2017-10-16 ENCOUNTER — Ambulatory Visit (INDEPENDENT_AMBULATORY_CARE_PROVIDER_SITE_OTHER): Payer: Medicare Other | Admitting: Pharmacist

## 2017-10-16 DIAGNOSIS — I2699 Other pulmonary embolism without acute cor pulmonale: Secondary | ICD-10-CM

## 2017-10-16 DIAGNOSIS — Z7901 Long term (current) use of anticoagulants: Secondary | ICD-10-CM | POA: Diagnosis not present

## 2017-10-16 DIAGNOSIS — I82403 Acute embolism and thrombosis of unspecified deep veins of lower extremity, bilateral: Secondary | ICD-10-CM | POA: Diagnosis not present

## 2017-10-16 LAB — POCT INR: INR: 2 (ref 2.0–3.0)

## 2017-11-17 ENCOUNTER — Telehealth: Payer: Self-pay

## 2017-11-17 NOTE — Telephone Encounter (Signed)
Contacted patient for final phone visit in the [place] research study.  No complaints or sae's.  AE reported to sponsor.

## 2017-11-21 DIAGNOSIS — N950 Postmenopausal bleeding: Secondary | ICD-10-CM | POA: Diagnosis not present

## 2017-11-21 DIAGNOSIS — D259 Leiomyoma of uterus, unspecified: Secondary | ICD-10-CM | POA: Diagnosis not present

## 2017-11-27 ENCOUNTER — Ambulatory Visit (INDEPENDENT_AMBULATORY_CARE_PROVIDER_SITE_OTHER): Payer: Medicare Other | Admitting: Pharmacist

## 2017-11-27 DIAGNOSIS — I82403 Acute embolism and thrombosis of unspecified deep veins of lower extremity, bilateral: Secondary | ICD-10-CM | POA: Diagnosis not present

## 2017-11-27 DIAGNOSIS — I2699 Other pulmonary embolism without acute cor pulmonale: Secondary | ICD-10-CM

## 2017-11-27 DIAGNOSIS — Z7901 Long term (current) use of anticoagulants: Secondary | ICD-10-CM | POA: Diagnosis not present

## 2017-11-27 LAB — POCT INR: INR: 2.1 (ref 2.0–3.0)

## 2018-01-08 ENCOUNTER — Ambulatory Visit (INDEPENDENT_AMBULATORY_CARE_PROVIDER_SITE_OTHER): Payer: Medicare Other | Admitting: Pharmacist

## 2018-01-08 DIAGNOSIS — I2699 Other pulmonary embolism without acute cor pulmonale: Secondary | ICD-10-CM

## 2018-01-08 DIAGNOSIS — Z7901 Long term (current) use of anticoagulants: Secondary | ICD-10-CM

## 2018-01-08 DIAGNOSIS — I82403 Acute embolism and thrombosis of unspecified deep veins of lower extremity, bilateral: Secondary | ICD-10-CM | POA: Diagnosis not present

## 2018-01-08 LAB — POCT INR: INR: 2.8 (ref 2.0–3.0)

## 2018-01-08 MED ORDER — METOPROLOL SUCCINATE ER 100 MG PO TB24: 100.0000 mg | ORAL_TABLET | Freq: Every day | ORAL | 3 refills | Status: DC

## 2018-01-08 MED ORDER — WARFARIN SODIUM 3 MG PO TABS: ORAL_TABLET | ORAL | 0 refills | Status: DC

## 2018-01-22 DIAGNOSIS — Z23 Encounter for immunization: Secondary | ICD-10-CM | POA: Diagnosis not present

## 2018-02-06 DIAGNOSIS — Z1231 Encounter for screening mammogram for malignant neoplasm of breast: Secondary | ICD-10-CM | POA: Diagnosis not present

## 2018-02-06 DIAGNOSIS — N950 Postmenopausal bleeding: Secondary | ICD-10-CM | POA: Diagnosis not present

## 2018-02-19 ENCOUNTER — Ambulatory Visit (INDEPENDENT_AMBULATORY_CARE_PROVIDER_SITE_OTHER): Payer: Medicare Other | Admitting: Pharmacist

## 2018-02-19 DIAGNOSIS — Z7901 Long term (current) use of anticoagulants: Secondary | ICD-10-CM

## 2018-02-19 DIAGNOSIS — I82403 Acute embolism and thrombosis of unspecified deep veins of lower extremity, bilateral: Secondary | ICD-10-CM

## 2018-02-19 DIAGNOSIS — I2699 Other pulmonary embolism without acute cor pulmonale: Secondary | ICD-10-CM

## 2018-02-19 LAB — POCT INR: INR: 1.5 — AB (ref 2.0–3.0)

## 2018-02-19 MED ORDER — FUROSEMIDE 40 MG PO TABS: 40.0000 mg | ORAL_TABLET | Freq: Two times a day (BID) | ORAL | 1 refills | Status: DC

## 2018-02-21 DIAGNOSIS — N390 Urinary tract infection, site not specified: Secondary | ICD-10-CM | POA: Diagnosis not present

## 2018-02-21 DIAGNOSIS — R82998 Other abnormal findings in urine: Secondary | ICD-10-CM | POA: Diagnosis not present

## 2018-03-06 DIAGNOSIS — N8500 Endometrial hyperplasia, unspecified: Secondary | ICD-10-CM | POA: Diagnosis not present

## 2018-03-19 ENCOUNTER — Ambulatory Visit (INDEPENDENT_AMBULATORY_CARE_PROVIDER_SITE_OTHER): Payer: Medicare Other | Admitting: Pharmacist

## 2018-03-19 DIAGNOSIS — I82403 Acute embolism and thrombosis of unspecified deep veins of lower extremity, bilateral: Secondary | ICD-10-CM

## 2018-03-19 DIAGNOSIS — Z7901 Long term (current) use of anticoagulants: Secondary | ICD-10-CM

## 2018-03-19 DIAGNOSIS — I2699 Other pulmonary embolism without acute cor pulmonale: Secondary | ICD-10-CM | POA: Diagnosis not present

## 2018-03-19 LAB — POCT INR: INR: 1.4 — AB (ref 2.0–3.0)

## 2018-03-20 DIAGNOSIS — Z7901 Long term (current) use of anticoagulants: Secondary | ICD-10-CM | POA: Diagnosis not present

## 2018-03-20 DIAGNOSIS — I2510 Atherosclerotic heart disease of native coronary artery without angina pectoris: Secondary | ICD-10-CM | POA: Diagnosis not present

## 2018-03-20 DIAGNOSIS — G4700 Insomnia, unspecified: Secondary | ICD-10-CM | POA: Diagnosis not present

## 2018-03-20 DIAGNOSIS — I509 Heart failure, unspecified: Secondary | ICD-10-CM | POA: Diagnosis not present

## 2018-03-20 DIAGNOSIS — E669 Obesity, unspecified: Secondary | ICD-10-CM | POA: Diagnosis not present

## 2018-03-20 DIAGNOSIS — D649 Anemia, unspecified: Secondary | ICD-10-CM | POA: Diagnosis not present

## 2018-03-20 DIAGNOSIS — F4321 Adjustment disorder with depressed mood: Secondary | ICD-10-CM | POA: Diagnosis not present

## 2018-03-20 DIAGNOSIS — Z7409 Other reduced mobility: Secondary | ICD-10-CM | POA: Diagnosis not present

## 2018-03-20 DIAGNOSIS — Z86711 Personal history of pulmonary embolism: Secondary | ICD-10-CM | POA: Diagnosis not present

## 2018-03-20 DIAGNOSIS — Z86718 Personal history of other venous thrombosis and embolism: Secondary | ICD-10-CM | POA: Diagnosis not present

## 2018-03-20 DIAGNOSIS — Z0000 Encounter for general adult medical examination without abnormal findings: Secondary | ICD-10-CM | POA: Diagnosis not present

## 2018-04-02 ENCOUNTER — Ambulatory Visit (INDEPENDENT_AMBULATORY_CARE_PROVIDER_SITE_OTHER): Payer: Medicare Other | Admitting: Pharmacist

## 2018-04-02 DIAGNOSIS — Z7901 Long term (current) use of anticoagulants: Secondary | ICD-10-CM

## 2018-04-02 DIAGNOSIS — I82403 Acute embolism and thrombosis of unspecified deep veins of lower extremity, bilateral: Secondary | ICD-10-CM

## 2018-04-02 DIAGNOSIS — I2699 Other pulmonary embolism without acute cor pulmonale: Secondary | ICD-10-CM

## 2018-04-02 LAB — POCT INR: INR: 1.9 — AB (ref 2.0–3.0)

## 2018-04-02 NOTE — Patient Instructions (Signed)
Description   Increase dose to 1 tablet daily except for 1.5 tablets every Monday and Friday. Repeat INR in 3 weeks.

## 2018-04-19 DIAGNOSIS — F4321 Adjustment disorder with depressed mood: Secondary | ICD-10-CM | POA: Diagnosis not present

## 2018-04-19 DIAGNOSIS — I10 Essential (primary) hypertension: Secondary | ICD-10-CM | POA: Diagnosis not present

## 2018-04-19 DIAGNOSIS — F419 Anxiety disorder, unspecified: Secondary | ICD-10-CM | POA: Diagnosis not present

## 2018-04-19 DIAGNOSIS — E049 Nontoxic goiter, unspecified: Secondary | ICD-10-CM | POA: Diagnosis not present

## 2018-04-19 DIAGNOSIS — Z0000 Encounter for general adult medical examination without abnormal findings: Secondary | ICD-10-CM | POA: Diagnosis not present

## 2018-04-19 DIAGNOSIS — R7309 Other abnormal glucose: Secondary | ICD-10-CM | POA: Diagnosis not present

## 2018-04-19 DIAGNOSIS — D649 Anemia, unspecified: Secondary | ICD-10-CM | POA: Diagnosis not present

## 2018-04-23 ENCOUNTER — Ambulatory Visit (INDEPENDENT_AMBULATORY_CARE_PROVIDER_SITE_OTHER): Payer: Medicare Other | Admitting: Pharmacist Clinician (PhC)/ Clinical Pharmacy Specialist

## 2018-04-23 ENCOUNTER — Other Ambulatory Visit: Payer: Self-pay | Admitting: Pharmacist Clinician (PhC)/ Clinical Pharmacy Specialist

## 2018-04-23 DIAGNOSIS — Z7901 Long term (current) use of anticoagulants: Secondary | ICD-10-CM

## 2018-04-23 DIAGNOSIS — I82403 Acute embolism and thrombosis of unspecified deep veins of lower extremity, bilateral: Secondary | ICD-10-CM | POA: Diagnosis not present

## 2018-04-23 DIAGNOSIS — I2699 Other pulmonary embolism without acute cor pulmonale: Secondary | ICD-10-CM | POA: Diagnosis not present

## 2018-04-23 LAB — POCT INR: INR: 2 (ref 2.0–3.0)

## 2018-04-23 MED ORDER — WARFARIN SODIUM 3 MG PO TABS: ORAL_TABLET | ORAL | 1 refills | Status: DC

## 2018-04-23 NOTE — Patient Instructions (Signed)
Description   Increase dose to 1.5 tablets daily except for 1 tablet every Sunday, Tuesday and Thursday. Repeat INR in 3 weeks.

## 2018-05-30 ENCOUNTER — Telehealth: Payer: Self-pay

## 2018-05-30 NOTE — Telephone Encounter (Signed)
Called pt for overdue inr pt compliant to come 1/24

## 2018-06-08 ENCOUNTER — Ambulatory Visit (INDEPENDENT_AMBULATORY_CARE_PROVIDER_SITE_OTHER): Payer: Medicare Other | Admitting: *Deleted

## 2018-06-08 DIAGNOSIS — I2699 Other pulmonary embolism without acute cor pulmonale: Secondary | ICD-10-CM

## 2018-06-08 DIAGNOSIS — Z7901 Long term (current) use of anticoagulants: Secondary | ICD-10-CM

## 2018-06-08 DIAGNOSIS — I82403 Acute embolism and thrombosis of unspecified deep veins of lower extremity, bilateral: Secondary | ICD-10-CM

## 2018-06-08 LAB — POCT INR: INR: 2.9 (ref 2.0–3.0)

## 2018-06-08 NOTE — Patient Instructions (Signed)
Description   Continue taking 1.5 tablets daily except for 1 tablet every Sunday, Tuesday and Thursday. Repeat INR in 3 weeks.

## 2018-06-13 DIAGNOSIS — E049 Nontoxic goiter, unspecified: Secondary | ICD-10-CM | POA: Diagnosis not present

## 2018-06-13 DIAGNOSIS — E0590 Thyrotoxicosis, unspecified without thyrotoxic crisis or storm: Secondary | ICD-10-CM | POA: Diagnosis not present

## 2018-06-14 ENCOUNTER — Other Ambulatory Visit (HOSPITAL_COMMUNITY): Payer: Self-pay | Admitting: Endocrinology

## 2018-06-14 DIAGNOSIS — E049 Nontoxic goiter, unspecified: Secondary | ICD-10-CM

## 2018-06-25 ENCOUNTER — Encounter (HOSPITAL_COMMUNITY)
Admission: RE | Admit: 2018-06-25 | Discharge: 2018-06-25 | Disposition: A | Discharge status: Home / Self Care | Payer: Medicare Other | Source: Ambulatory Visit | Attending: Endocrinology | Admitting: Endocrinology

## 2018-06-25 DIAGNOSIS — E049 Nontoxic goiter, unspecified: Secondary | ICD-10-CM | POA: Diagnosis not present

## 2018-06-25 MED ORDER — SODIUM IODIDE I-123 7.4 MBQ CAPS
421.0000 | ORAL_CAPSULE | Freq: Once | ORAL | Status: AC
  Administered 2018-06-25: 421 via ORAL

## 2018-06-26 ENCOUNTER — Encounter (HOSPITAL_COMMUNITY)
Admission: RE | Admit: 2018-06-26 | Discharge: 2018-06-26 | Disposition: A | Discharge status: Home / Self Care | Payer: Medicare Other | Source: Ambulatory Visit | Attending: Endocrinology | Admitting: Endocrinology

## 2018-06-26 DIAGNOSIS — E049 Nontoxic goiter, unspecified: Secondary | ICD-10-CM | POA: Diagnosis not present

## 2018-07-02 ENCOUNTER — Ambulatory Visit (INDEPENDENT_AMBULATORY_CARE_PROVIDER_SITE_OTHER): Payer: Medicare Other | Admitting: *Deleted

## 2018-07-02 DIAGNOSIS — Z7901 Long term (current) use of anticoagulants: Secondary | ICD-10-CM | POA: Diagnosis not present

## 2018-07-02 DIAGNOSIS — I82403 Acute embolism and thrombosis of unspecified deep veins of lower extremity, bilateral: Secondary | ICD-10-CM

## 2018-07-02 DIAGNOSIS — I2699 Other pulmonary embolism without acute cor pulmonale: Secondary | ICD-10-CM

## 2018-07-02 LAB — POCT INR: INR: 2 (ref 2.0–3.0)

## 2018-07-02 NOTE — Patient Instructions (Signed)
Description   Today take 2 tablets, then Continue taking 1.5 tablets daily except for 1 tablet every Sunday, Tuesday and Thursday. Repeat INR in 3 weeks.

## 2018-07-19 DIAGNOSIS — G4700 Insomnia, unspecified: Secondary | ICD-10-CM | POA: Diagnosis not present

## 2018-07-19 DIAGNOSIS — R0683 Snoring: Secondary | ICD-10-CM | POA: Diagnosis not present

## 2018-07-19 DIAGNOSIS — E669 Obesity, unspecified: Secondary | ICD-10-CM | POA: Diagnosis not present

## 2018-07-19 DIAGNOSIS — F329 Major depressive disorder, single episode, unspecified: Secondary | ICD-10-CM | POA: Diagnosis not present

## 2018-07-25 ENCOUNTER — Other Ambulatory Visit: Payer: Self-pay

## 2018-07-25 ENCOUNTER — Ambulatory Visit (INDEPENDENT_AMBULATORY_CARE_PROVIDER_SITE_OTHER): Payer: Medicare Other | Admitting: Pharmacist

## 2018-07-25 DIAGNOSIS — I2699 Other pulmonary embolism without acute cor pulmonale: Secondary | ICD-10-CM | POA: Diagnosis not present

## 2018-07-25 DIAGNOSIS — Z7901 Long term (current) use of anticoagulants: Secondary | ICD-10-CM

## 2018-07-25 DIAGNOSIS — I82403 Acute embolism and thrombosis of unspecified deep veins of lower extremity, bilateral: Secondary | ICD-10-CM | POA: Diagnosis not present

## 2018-07-25 LAB — POCT INR: INR: 2.4 (ref 2.0–3.0)

## 2018-07-25 MED ORDER — WARFARIN SODIUM 3 MG PO TABS: ORAL_TABLET | ORAL | 1 refills | Status: DC

## 2018-07-25 MED ORDER — METOPROLOL SUCCINATE ER 100 MG PO TB24: 100.0000 mg | ORAL_TABLET | Freq: Every day | ORAL | 1 refills | Status: DC

## 2018-07-31 ENCOUNTER — Other Ambulatory Visit: Payer: Self-pay | Admitting: Cardiovascular Disease

## 2018-08-13 DIAGNOSIS — F329 Major depressive disorder, single episode, unspecified: Secondary | ICD-10-CM | POA: Diagnosis not present

## 2018-09-05 ENCOUNTER — Telehealth: Payer: Self-pay

## 2018-09-05 NOTE — Telephone Encounter (Signed)

## 2018-09-07 ENCOUNTER — Ambulatory Visit (INDEPENDENT_AMBULATORY_CARE_PROVIDER_SITE_OTHER): Payer: Medicare Other | Admitting: Pharmacist

## 2018-09-07 ENCOUNTER — Other Ambulatory Visit: Payer: Self-pay

## 2018-09-07 DIAGNOSIS — I2699 Other pulmonary embolism without acute cor pulmonale: Secondary | ICD-10-CM

## 2018-09-07 DIAGNOSIS — Z7901 Long term (current) use of anticoagulants: Secondary | ICD-10-CM | POA: Diagnosis not present

## 2018-09-07 DIAGNOSIS — I82403 Acute embolism and thrombosis of unspecified deep veins of lower extremity, bilateral: Secondary | ICD-10-CM | POA: Diagnosis not present

## 2018-09-07 LAB — POCT INR: INR: 2.2 (ref 2.0–3.0)

## 2018-09-19 DIAGNOSIS — F418 Other specified anxiety disorders: Secondary | ICD-10-CM | POA: Diagnosis not present

## 2018-09-19 DIAGNOSIS — G4700 Insomnia, unspecified: Secondary | ICD-10-CM | POA: Diagnosis not present

## 2018-09-21 DIAGNOSIS — G4700 Insomnia, unspecified: Secondary | ICD-10-CM | POA: Diagnosis not present

## 2018-09-24 DIAGNOSIS — E0590 Thyrotoxicosis, unspecified without thyrotoxic crisis or storm: Secondary | ICD-10-CM | POA: Diagnosis not present

## 2018-09-25 DIAGNOSIS — E0590 Thyrotoxicosis, unspecified without thyrotoxic crisis or storm: Secondary | ICD-10-CM | POA: Diagnosis not present

## 2018-09-25 DIAGNOSIS — D649 Anemia, unspecified: Secondary | ICD-10-CM | POA: Diagnosis not present

## 2018-10-04 DIAGNOSIS — G4700 Insomnia, unspecified: Secondary | ICD-10-CM | POA: Diagnosis not present

## 2018-10-04 DIAGNOSIS — F418 Other specified anxiety disorders: Secondary | ICD-10-CM | POA: Diagnosis not present

## 2018-10-29 ENCOUNTER — Telehealth: Payer: Self-pay

## 2018-10-29 MED ORDER — WARFARIN SODIUM 3 MG PO TABS: ORAL_TABLET | ORAL | 0 refills | Status: DC

## 2018-10-29 NOTE — Telephone Encounter (Signed)

## 2018-10-29 NOTE — Telephone Encounter (Signed)
lmom for prescreen in office aware 

## 2018-10-29 NOTE — Addendum Note (Signed)
Addended by: Allean Found on: 10/29/2018 12:58 PM   Modules accepted: Orders

## 2018-11-02 ENCOUNTER — Other Ambulatory Visit: Payer: Self-pay

## 2018-11-02 ENCOUNTER — Ambulatory Visit (INDEPENDENT_AMBULATORY_CARE_PROVIDER_SITE_OTHER): Payer: Medicare Other | Admitting: *Deleted

## 2018-11-02 DIAGNOSIS — Z7901 Long term (current) use of anticoagulants: Secondary | ICD-10-CM

## 2018-11-02 DIAGNOSIS — I82403 Acute embolism and thrombosis of unspecified deep veins of lower extremity, bilateral: Secondary | ICD-10-CM | POA: Diagnosis not present

## 2018-11-02 DIAGNOSIS — I2699 Other pulmonary embolism without acute cor pulmonale: Secondary | ICD-10-CM | POA: Diagnosis not present

## 2018-11-02 LAB — POCT INR: INR: 3.2 — AB (ref 2.0–3.0)

## 2018-11-02 NOTE — Patient Instructions (Signed)
Description   Skip today's dose, then Continue taking 1.5 tablets daily except for 1 tablet every Sunday, Tuesday and Thursday. Repeat INR in 4 weeks. Pt will check with insurance on Eliquis pricing (would qualify for Eliquis 2.5mg  BID VTE prevention dosing)

## 2018-11-19 DIAGNOSIS — D649 Anemia, unspecified: Secondary | ICD-10-CM | POA: Diagnosis not present

## 2018-11-19 DIAGNOSIS — E0590 Thyrotoxicosis, unspecified without thyrotoxic crisis or storm: Secondary | ICD-10-CM | POA: Diagnosis not present

## 2018-11-20 DIAGNOSIS — E0590 Thyrotoxicosis, unspecified without thyrotoxic crisis or storm: Secondary | ICD-10-CM | POA: Diagnosis not present

## 2018-11-20 DIAGNOSIS — D649 Anemia, unspecified: Secondary | ICD-10-CM | POA: Diagnosis not present

## 2018-11-27 ENCOUNTER — Telehealth: Payer: Self-pay

## 2018-11-27 NOTE — Telephone Encounter (Signed)

## 2018-11-30 ENCOUNTER — Other Ambulatory Visit: Payer: Self-pay

## 2018-11-30 ENCOUNTER — Encounter (INDEPENDENT_AMBULATORY_CARE_PROVIDER_SITE_OTHER): Payer: Self-pay

## 2018-11-30 ENCOUNTER — Ambulatory Visit (INDEPENDENT_AMBULATORY_CARE_PROVIDER_SITE_OTHER): Payer: Medicare Other | Admitting: *Deleted

## 2018-11-30 DIAGNOSIS — Z7901 Long term (current) use of anticoagulants: Secondary | ICD-10-CM | POA: Diagnosis not present

## 2018-11-30 DIAGNOSIS — I2699 Other pulmonary embolism without acute cor pulmonale: Secondary | ICD-10-CM | POA: Diagnosis not present

## 2018-11-30 DIAGNOSIS — I82403 Acute embolism and thrombosis of unspecified deep veins of lower extremity, bilateral: Secondary | ICD-10-CM | POA: Diagnosis not present

## 2018-11-30 LAB — POCT INR: INR: 2.8 (ref 2.0–3.0)

## 2018-11-30 NOTE — Patient Instructions (Signed)
Description   Continue taking 1.5 tablets daily except for 1 tablet every Sunday, Tuesday and Thursday. Repeat INR in 4 weeks. Pt will check with insurance on Eliquis pricing (would qualify for Eliquis 2.5mg  BID VTE prevention dosing)

## 2018-12-28 ENCOUNTER — Ambulatory Visit (INDEPENDENT_AMBULATORY_CARE_PROVIDER_SITE_OTHER): Payer: Medicare Other | Admitting: *Deleted

## 2018-12-28 ENCOUNTER — Other Ambulatory Visit: Payer: Self-pay

## 2018-12-28 DIAGNOSIS — I2699 Other pulmonary embolism without acute cor pulmonale: Secondary | ICD-10-CM | POA: Diagnosis not present

## 2018-12-28 DIAGNOSIS — Z7901 Long term (current) use of anticoagulants: Secondary | ICD-10-CM

## 2018-12-28 DIAGNOSIS — I82403 Acute embolism and thrombosis of unspecified deep veins of lower extremity, bilateral: Secondary | ICD-10-CM

## 2018-12-28 LAB — POCT INR: INR: 5.1 — AB (ref 2.0–3.0)

## 2018-12-28 NOTE — Patient Instructions (Signed)
Description   Skip today and tomorrow's dose, then Continue taking 1.5 tablets daily except for 1 tablet every Sunday, Tuesday and Thursday. Repeat INR in 10 days. Pt will check with insurance on Eliquis pricing (would qualify for Eliquis 2.5mg  BID VTE prevention dosing)

## 2019-01-07 ENCOUNTER — Ambulatory Visit (INDEPENDENT_AMBULATORY_CARE_PROVIDER_SITE_OTHER): Payer: Medicare Other | Admitting: Pharmacist Clinician (PhC)/ Clinical Pharmacy Specialist

## 2019-01-07 ENCOUNTER — Other Ambulatory Visit: Payer: Self-pay

## 2019-01-07 DIAGNOSIS — I82403 Acute embolism and thrombosis of unspecified deep veins of lower extremity, bilateral: Secondary | ICD-10-CM | POA: Diagnosis not present

## 2019-01-07 DIAGNOSIS — Z7901 Long term (current) use of anticoagulants: Secondary | ICD-10-CM | POA: Diagnosis not present

## 2019-01-07 DIAGNOSIS — I2699 Other pulmonary embolism without acute cor pulmonale: Secondary | ICD-10-CM | POA: Diagnosis not present

## 2019-01-07 LAB — POCT INR: INR: 4.4 — AB (ref 2.0–3.0)

## 2019-01-07 MED ORDER — WARFARIN SODIUM 3 MG PO TABS: ORAL_TABLET | ORAL | 1 refills | Status: DC

## 2019-01-18 ENCOUNTER — Other Ambulatory Visit: Payer: Self-pay

## 2019-01-18 ENCOUNTER — Ambulatory Visit (INDEPENDENT_AMBULATORY_CARE_PROVIDER_SITE_OTHER): Payer: Medicare Other | Admitting: Pharmacist Clinician (PhC)/ Clinical Pharmacy Specialist

## 2019-01-18 DIAGNOSIS — Z7901 Long term (current) use of anticoagulants: Secondary | ICD-10-CM

## 2019-01-18 DIAGNOSIS — I2699 Other pulmonary embolism without acute cor pulmonale: Secondary | ICD-10-CM | POA: Diagnosis not present

## 2019-01-18 DIAGNOSIS — I82403 Acute embolism and thrombosis of unspecified deep veins of lower extremity, bilateral: Secondary | ICD-10-CM | POA: Diagnosis not present

## 2019-01-18 LAB — POCT INR: INR: 3.2 — AB (ref 2.0–3.0)

## 2019-01-22 ENCOUNTER — Other Ambulatory Visit: Payer: Self-pay

## 2019-01-22 ENCOUNTER — Observation Stay (HOSPITAL_COMMUNITY)
Admission: EM | Admit: 2019-01-22 | Discharge: 2019-01-23 | Disposition: A | Discharge status: Home / Self Care | Payer: Medicare Other | Attending: Internal Medicine | Admitting: Internal Medicine

## 2019-01-22 ENCOUNTER — Emergency Department (HOSPITAL_COMMUNITY): Payer: Medicare Other

## 2019-01-22 ENCOUNTER — Encounter (HOSPITAL_COMMUNITY): Payer: Self-pay | Admitting: Emergency Medicine

## 2019-01-22 DIAGNOSIS — R4182 Altered mental status, unspecified: Secondary | ICD-10-CM | POA: Diagnosis not present

## 2019-01-22 DIAGNOSIS — Z03818 Encounter for observation for suspected exposure to other biological agents ruled out: Secondary | ICD-10-CM | POA: Diagnosis not present

## 2019-01-22 DIAGNOSIS — I5042 Chronic combined systolic (congestive) and diastolic (congestive) heart failure: Secondary | ICD-10-CM | POA: Diagnosis present

## 2019-01-22 DIAGNOSIS — Z86718 Personal history of other venous thrombosis and embolism: Secondary | ICD-10-CM | POA: Diagnosis not present

## 2019-01-22 DIAGNOSIS — I110 Hypertensive heart disease with heart failure: Secondary | ICD-10-CM | POA: Insufficient documentation

## 2019-01-22 DIAGNOSIS — Z7901 Long term (current) use of anticoagulants: Secondary | ICD-10-CM

## 2019-01-22 DIAGNOSIS — Z6841 Body Mass Index (BMI) 40.0 and over, adult: Secondary | ICD-10-CM | POA: Insufficient documentation

## 2019-01-22 DIAGNOSIS — Z882 Allergy status to sulfonamides status: Secondary | ICD-10-CM | POA: Diagnosis not present

## 2019-01-22 DIAGNOSIS — I10 Essential (primary) hypertension: Secondary | ICD-10-CM | POA: Diagnosis present

## 2019-01-22 DIAGNOSIS — R410 Disorientation, unspecified: Principal | ICD-10-CM | POA: Diagnosis present

## 2019-01-22 DIAGNOSIS — E669 Obesity, unspecified: Secondary | ICD-10-CM | POA: Diagnosis not present

## 2019-01-22 DIAGNOSIS — R442 Other hallucinations: Secondary | ICD-10-CM | POA: Diagnosis not present

## 2019-01-22 DIAGNOSIS — Z881 Allergy status to other antibiotic agents status: Secondary | ICD-10-CM | POA: Insufficient documentation

## 2019-01-22 DIAGNOSIS — Z86711 Personal history of pulmonary embolism: Secondary | ICD-10-CM | POA: Diagnosis not present

## 2019-01-22 DIAGNOSIS — Z79899 Other long term (current) drug therapy: Secondary | ICD-10-CM | POA: Diagnosis not present

## 2019-01-22 DIAGNOSIS — G479 Sleep disorder, unspecified: Secondary | ICD-10-CM | POA: Diagnosis not present

## 2019-01-22 DIAGNOSIS — G4700 Insomnia, unspecified: Secondary | ICD-10-CM | POA: Diagnosis present

## 2019-01-22 DIAGNOSIS — Z20828 Contact with and (suspected) exposure to other viral communicable diseases: Secondary | ICD-10-CM | POA: Insufficient documentation

## 2019-01-22 DIAGNOSIS — R9431 Abnormal electrocardiogram [ECG] [EKG]: Secondary | ICD-10-CM | POA: Diagnosis not present

## 2019-01-22 DIAGNOSIS — M6281 Muscle weakness (generalized): Secondary | ICD-10-CM | POA: Insufficient documentation

## 2019-01-22 DIAGNOSIS — R0902 Hypoxemia: Secondary | ICD-10-CM | POA: Diagnosis not present

## 2019-01-22 LAB — BASIC METABOLIC PANEL
Anion gap: 9 (ref 5–15)
BUN: 14 mg/dL (ref 8–23)
CO2: 24 mmol/L (ref 22–32)
Calcium: 8.5 mg/dL — ABNORMAL LOW (ref 8.9–10.3)
Chloride: 106 mmol/L (ref 98–111)
Creatinine, Ser: 0.87 mg/dL (ref 0.44–1.00)
GFR calc Af Amer: >60 mL/min (ref >60)
GFR calc non Af Amer: >60 mL/min (ref >60)
Glucose, Bld: 117 mg/dL — ABNORMAL HIGH (ref 70–99)
Potassium: 3.9 mmol/L (ref 3.5–5.1)
Sodium: 139 mmol/L (ref 135–145)

## 2019-01-22 LAB — CBC WITH DIFFERENTIAL/PLATELET
Abs Immature Granulocytes: 0.02 10*3/uL (ref 0.00–0.07)
Basophils Absolute: 0 10*3/uL (ref 0.0–0.1)
Basophils Relative: 0 %
Eosinophils Absolute: 0 10*3/uL (ref 0.0–0.5)
Eosinophils Relative: 1 %
HCT: 47.4 % — ABNORMAL HIGH (ref 36.0–46.0)
Hemoglobin: 15.1 g/dL — ABNORMAL HIGH (ref 12.0–15.0)
Immature Granulocytes: 0 %
Lymphocytes Relative: 16 %
Lymphs Abs: 1.3 10*3/uL (ref 0.7–4.0)
MCH: 29.2 pg (ref 26.0–34.0)
MCHC: 31.9 g/dL (ref 30.0–36.0)
MCV: 91.5 fL (ref 80.0–100.0)
Monocytes Absolute: 0.6 10*3/uL (ref 0.1–1.0)
Monocytes Relative: 7 %
Neutro Abs: 6.4 10*3/uL (ref 1.7–7.7)
Neutrophils Relative %: 76 %
Platelets: 310 10*3/uL (ref 150–400)
RBC: 5.18 MIL/uL — ABNORMAL HIGH (ref 3.87–5.11)
RDW: 13.6 % (ref 11.5–15.5)
WBC: 8.4 10*3/uL (ref 4.0–10.5)
nRBC: 0 % (ref 0.0–0.2)

## 2019-01-22 LAB — TSH: TSH: 3.347 u[IU]/mL (ref 0.350–4.500)

## 2019-01-22 LAB — RAPID URINE DRUG SCREEN, HOSP PERFORMED
Amphetamines: NOT DETECTED
Barbiturates: NOT DETECTED
Benzodiazepines: POSITIVE — AB
Cocaine: NOT DETECTED
Opiates: POSITIVE — AB
Tetrahydrocannabinol: NOT DETECTED

## 2019-01-22 LAB — URINALYSIS, ROUTINE W REFLEX MICROSCOPIC
Bilirubin Urine: NEGATIVE
Glucose, UA: NEGATIVE mg/dL
Hgb urine dipstick: NEGATIVE
Ketones, ur: NEGATIVE mg/dL
Leukocytes,Ua: NEGATIVE
Nitrite: NEGATIVE
Protein, ur: NEGATIVE mg/dL
Specific Gravity, Urine: 1.019 (ref 1.005–1.030)
pH: 6 (ref 5.0–8.0)

## 2019-01-22 LAB — T4, FREE: Free T4: 0.87 ng/dL (ref 0.61–1.12)

## 2019-01-22 LAB — PROTIME-INR
INR: 2.6 — ABNORMAL HIGH (ref 0.8–1.2)
Prothrombin Time: 27.5 seconds — ABNORMAL HIGH (ref 11.4–15.2)

## 2019-01-22 LAB — SARS CORONAVIRUS 2 BY RT PCR (HOSPITAL ORDER, PERFORMED IN ~~LOC~~ HOSPITAL LAB): SARS Coronavirus 2: NEGATIVE

## 2019-01-22 LAB — ETHANOL: Alcohol, Ethyl (B): <10 mg/dL (ref <10)

## 2019-01-22 MED ORDER — PROGESTERONE MICRONIZED 100 MG PO CAPS
200.0000 mg | ORAL_CAPSULE | Freq: Every day | ORAL | Status: DC
  Administered 2019-01-22: 200 mg via ORAL
  Filled 2019-01-22 (×2): qty 2

## 2019-01-22 MED ORDER — ACETAMINOPHEN 650 MG RE SUPP: 650.0000 mg | Freq: Four times a day (QID) | RECTAL | Status: DC | PRN

## 2019-01-22 MED ORDER — ONDANSETRON HCL 4 MG PO TABS: 4.0000 mg | ORAL_TABLET | Freq: Four times a day (QID) | ORAL | Status: DC | PRN

## 2019-01-22 MED ORDER — METHIMAZOLE 5 MG PO TABS
5.0000 mg | ORAL_TABLET | Freq: Every day | ORAL | Status: DC
  Administered 2019-01-22: 20:00:00 5 mg via ORAL
  Filled 2019-01-22 (×4): qty 1

## 2019-01-22 MED ORDER — CITALOPRAM HYDROBROMIDE 10 MG PO TABS
20.0000 mg | ORAL_TABLET | Freq: Every day | ORAL | Status: DC
  Administered 2019-01-22 – 2019-01-23 (×2): 20 mg via ORAL
  Filled 2019-01-22 (×2): qty 2

## 2019-01-22 MED ORDER — HALOPERIDOL LACTATE 5 MG/ML IJ SOLN: 5.0000 mg | Freq: Four times a day (QID) | INTRAMUSCULAR | Status: DC | PRN

## 2019-01-22 MED ORDER — METOPROLOL SUCCINATE ER 100 MG PO TB24
100.0000 mg | ORAL_TABLET | Freq: Every day | ORAL | Status: DC
  Administered 2019-01-22 – 2019-01-23 (×2): 100 mg via ORAL
  Filled 2019-01-22: qty 4
  Filled 2019-01-22: qty 1

## 2019-01-22 MED ORDER — ONDANSETRON HCL 4 MG/2ML IJ SOLN: 4.0000 mg | Freq: Four times a day (QID) | INTRAMUSCULAR | Status: DC | PRN

## 2019-01-22 MED ORDER — BUPROPION HCL ER (XL) 150 MG PO TB24
150.0000 mg | ORAL_TABLET | Freq: Every day | ORAL | Status: DC
  Administered 2019-01-23: 150 mg via ORAL
  Filled 2019-01-22 (×2): qty 1

## 2019-01-22 MED ORDER — DIAZEPAM 5 MG/ML IJ SOLN
5.0000 mg | Freq: Four times a day (QID) | INTRAMUSCULAR | Status: DC | PRN
  Administered 2019-01-22: 5 mg via INTRAVENOUS
  Filled 2019-01-22: qty 2

## 2019-01-22 MED ORDER — DOCUSATE SODIUM 100 MG PO CAPS
100.0000 mg | ORAL_CAPSULE | Freq: Two times a day (BID) | ORAL | Status: DC
  Administered 2019-01-22 – 2019-01-23 (×2): 100 mg via ORAL
  Filled 2019-01-22 (×3): qty 1

## 2019-01-22 MED ORDER — WARFARIN - PHARMACIST DOSING INPATIENT
Freq: Every day | Status: DC
  Administered 2019-01-23: 18:00:00

## 2019-01-22 MED ORDER — ACETAMINOPHEN 325 MG PO TABS: 650.0000 mg | ORAL_TABLET | Freq: Four times a day (QID) | ORAL | Status: DC | PRN

## 2019-01-22 MED ORDER — WARFARIN SODIUM 3 MG PO TABS
3.0000 mg | ORAL_TABLET | Freq: Once | ORAL | Status: AC
  Administered 2019-01-22: 3 mg via ORAL
  Filled 2019-01-22 (×2): qty 1

## 2019-01-22 NOTE — H&P (Signed)
History and Physical    Nicole Gibbs [id number] DOB: [birth year] DOA: 01/22/2019  PCP: [name], MD Consultants:  Chalmers Cater - endocrinology; [name] - cardiology; "[employer]"; Helane Rima - OB/GYN Patient coming from:  Home - lives with daughter; Donald Prose: Daughter, Caryl Asp, [phone]  Chief Complaint: AMS  HPI: Nicole Gibbs is a 73 y.o. female with medical history significant of PE on Coumadin; HTN; morbid obesity (BMI 41); depression; and chronic combined CHF presenting with AMS.  She urinated on the bed and is very agitated about that.  She went to bed late, about 2-3AM.  About 6AM, her daughter heard her walking around and talking to herself.  Her daughter thought she took too many sleeping pills.  She fell and it took about an hour to get her back to bed.  She continued to be agitated and get out of bed.  The daughter called her aunt and uncle and they recommended transport to the ER.  She has been confused all morning, but does seem to be clearing a bit.  She is vaguely aware of being in the hospital - understands a few things and then is also confused.  She is a bit flushed and intermittently nauseated.  Her daughter is worried because evaluation looks negative.   ED Course:  Patient with AMS, psychosis - ?related to medications.  No meningismus, head CT negative.  Took 2 Sonata, 2 Valium, and an OTC sleeping medication last night.  6AM, her daughter found her wandering, hallucinating, psychotic  Daughter does not think it was SI.  No neuro deficits.  Review of Systems: As per HPI; otherwise review of systems reviewed and negative.  Limited by delirium.  Ambulatory Status:  Ambulates without assistance  Past Medical History:  Diagnosis Date   Arthritis    knees   Chronic combined systolic and diastolic CHF (congestive heart failure) ([place])    a. 07/2012 Echo: EF 55-60%, Gr1 DD;  b. 06/2015 Echo: EF 35-40%, triv AI, Mod MR, mod dil LA, mildly reduced RV.   DVT, bilateral lower limbs ([place])    H/O  cardiovascular stress test    a. 07/2012 MV: fixed mid-dist anterior and apical defect - scar vs attenuation, distal ant HK, no reversibility, EF 51%-->low risk.   Hypertensive heart disease    Nocturia    Normal coronary arteries    by cardiac catheterization 09/03/15   Obese    Postmenopausal vaginal bleeding    Pulmonary embolism, bilateral (HCC)    a. chronic coumadin.   Rash    under breasts   Thyroid nodule    no meds    Past Surgical History:  Procedure Laterality Date   CARDIAC CATHETERIZATION N/A 09/03/2015   Procedure: Left Heart Cath and Coronary Angiography;  Surgeon: [name], MD;  Location: [facility];  Service: Cardiovascular;  Laterality: N/A;   CARDIOVASCULAR STRESS TEST  08-07-2012  DR HILTY/ DR BERRY   LOW RISK NUCLEAR STUDY/ NO ISCHEMIA/ FIXED MID TO DISTAL ANTERIOR AND APICAL DEFECT MAY REPRESENT SCAR VS BREAST ATTENUATION ARTIFACT/  MILD DISTAL ANTERIOR HYPOKINESIS/ EF 51%   CESAREAN SECTION  1992   COLONOSCOPY N/A 05/08/2015   Procedure: COLONOSCOPY;  Surgeon: Laurence Spates, MD;  Location: [facility];  Service: Endoscopy;  Laterality: N/A;   DILATION AND CURETTAGE OF UTERUS  06/20/2012   Procedure: DILATATION AND CURETTAGE;  Surgeon: [name], MD;  Location: [facility];  Service: Gynecology;  Laterality: N/A;   dilation and evacution  1988   missed  ab   [place] N/A 09/04/2012   Procedure: DILATATION & CURETTAGE WITH THERMACHOICE ABLATION;  Surgeon: [name], MD;  Location: [facility];  Service: Gynecology;  Laterality: N/A;   ESOPHAGOGASTRODUODENOSCOPY N/A 05/07/2015   Procedure: ESOPHAGOGASTRODUODENOSCOPY (EGD);  Surgeon: Laurence Spates, MD;  Location: [facility];  Service: Endoscopy;  Laterality: N/A;   HYSTEROSCOPY W/D&C  03-24-2009   TRANSTHORACIC ECHOCARDIOGRAM  08-08-2012   LVSF NORMAL/ EF [id number]  GRADE I DIASTOLIC DYSFUNCTION/ MILD AV  AND MV REGURG./  RVSF MILDLY REDUCED    Social History   Socioeconomic History   Marital status: Married    Spouse name: Not on file   Number of children: Not on file   Years of education: Not on file   Highest education level: Not on file  Occupational History   Not on file  Social Needs   Financial resource strain: Not on file   Food insecurity    Worry: Not on file    Inability: Not on file   Transportation needs    Medical: Not on file    Non-medical: Not on file  Tobacco Use   Smoking status: Never Smoker   Smokeless tobacco: Never Used  Substance and Sexual Activity   Alcohol use: No   Drug use: No   Sexual activity: Not on file  Lifestyle   Physical activity    Days per week: Not on file    Minutes per session: Not on file   Stress: Not on file  Relationships   Social connections    Talks on phone: Not on file    Gets together: Not on file    Attends religious service: Not on file    Active member of club or organization: Not on file    Attends meetings of clubs or organizations: Not on file    Relationship status: Not on file   Intimate partner violence    Fear of current or ex partner: Not on file    Emotionally abused: Not on file    Physically abused: Not on file    Forced sexual activity: Not on file  Other Topics Concern   Not on file  Social History Narrative   Not on file    Allergies  Allergen Reactions   Keflex [Cephalexin] Diarrhea   Sulfa Antibiotics Hives    Family History  Problem Relation Age of Onset   Coronary artery disease Father 39   Stroke Father    Hypertension Mother    Mental illness Mother        anxiety   Sudden death Sister     Prior to Admission medications   Medication Sig Start Date End Date Taking? Authorizing Provider  acetaminophen (TYLENOL) 325 MG tablet Take 650 mg by mouth every 6 (six) hours as needed for moderate pain.   Yes [provider]  buPROPion (WELLBUTRIN XL)  150 MG 24 hr tablet Take 150 mg by mouth daily.   Yes [provider]  citalopram (CELEXA) 20 MG tablet Take 20 mg by mouth daily.   Yes Janie Morning, DO  furosemide (LASIX) 40 MG tablet Take 1 tablet (40 mg total) by mouth 2 (two) times daily. Patient taking differently: Take 40 mg by mouth as needed for fluid or edema.  02/19/18  Yes [name], MD  methimazole (TAPAZOLE) 5 MG tablet Take 5 mg by mouth daily.   Yes [provider]  metoprolol succinate (TOPROL-XL) 100 MG  24 hr tablet Take 1 tablet (100 mg total) by mouth daily. Take with or immediately following a meal. 07/25/18 01/22/19 Yes [name], MD  Multiple Vitamin (MULTIVITAMIN) tablet Take 1 tablet by mouth daily.   Yes [provider]  nitroGLYCERIN (NITROSTAT) 0.4 MG SL tablet Place 1 tablet (0.4 mg total) under the tongue every 5 (five) minutes as needed for chest pain. 06/20/14  Yes [name], MD  nystatin (MYCOSTATIN/NYSTOP) 100000 UNIT/GM POWD Apply to your groin TID Patient taking differently: Apply 1 Bottle topically 3 (three) times daily as needed (groin).  07/09/15  Yes Thurnell Lose, MD  potassium chloride SA (K-DUR,KLOR-CON) 20 MEQ tablet Take 2 tablets (40 mEq total) by mouth daily. Patient taking differently: Take 40 mEq by mouth as needed (when taking furosemid).  07/10/15  Yes Thurnell Lose, MD  progesterone (PROMETRIUM) 200 MG capsule Take 200 mg by mouth at bedtime.    Yes [provider]  warfarin (COUMADIN) 3 MG tablet Take 1 to 1.5 tablets by mouth daily as directed Patient taking differently: Take 3-4.5 mg by mouth See admin instructions. Take 1 tab (3mg ) by mouth all days, EXCEPT on Monday take 1 1/2 (4.5mg ) 01/07/19  Yes [name], MD  zaleplon (SONATA) 5 MG capsule Take 5-10 mg by mouth at bedtime.    Yes [provider]  AMBULATORY NON FORMULARY MEDICATION Take 10 mg by mouth once. Medication Name: Verciguat 10mg  tablet daily [facility]  Heart Failure Study) 11/03/15 11/03/15  Larey Dresser, MD  b complex vitamins capsule Take 1 capsule by mouth daily. Patient not taking: Reported on 03/28/2016 08/11/15   Brunetta Genera, MD  ferrous sulfate 325 (65 FE) MG tablet Take 1 tablet (325 mg total) by mouth 2 (two) times daily with a meal. Patient not taking: Reported on 01/22/2019 07/08/15   Thurnell Lose, MD  lisinopril (PRINIVIL,ZESTRIL) 2.5 MG tablet Take 1 tablet (2.5 mg total) by mouth daily. Patient not taking: Reported on 01/22/2019 07/15/15   Burtis Junes, NP    Physical Exam: Vitals:   01/22/19 1145 01/22/19 1200 01/22/19 1230 01/22/19 1318  BP: (!) 144/79 139/64  (!) 159/90  Pulse:  78 69 88  Resp: 15 (!) 21 16 (!) 24  Temp:      TempSrc:      SpO2:  94% 95% 95%  Weight:      Height:          General:  Appears agitated, tangential  Eyes:  PERRL, EOMI, normal lids, iris  ENT:  grossly normal hearing, lips & tongue, mmm  Neck:  no LAD, masses or thyromegaly  Cardiovascular:  RRR, no m/r/g. No LE edema.   Respiratory:   CTA bilaterally with no wheezes/rales/rhonchi.  Normal respiratory effort.  Abdomen:  soft, NT, ND, NABS  Back:   normal alignment, no CVAT  Skin:  no rash or induration seen on limited exam  Musculoskeletal:  grossly normal tone BUE/BLE, good ROM, no bony abnormality  Psychiatric:  Delirious - distractable, tangential, with flight of ideas, but AOx3  Neurologic:  CN 2-12 grossly intact, moves all extremities in coordinated fashion, sensation intact    Radiological Exams on Admission: Ct Head Wo Contrast  Result Date: 01/22/2019 CLINICAL DATA:  Altered mental status/disorientation EXAM: CT HEAD WITHOUT CONTRAST TECHNIQUE: Contiguous axial images were obtained from the base of the skull through the vertex without intravenous contrast. COMPARISON:  None. FINDINGS: Note that there is a degree of  motion artifact. Brain: The ventricles appear normal in size and configuration.  There is, however, a degree of frontal and parietal lobe sulcal atrophy bilaterally. There is no intracranial mass, hemorrhage, extra-axial fluid collection, or midline shift. There is slight small vessel disease in the centra semiovale bilaterally. Elsewhere brain parenchyma appears unremarkable. No acute infarct evident. Vascular: No hyperdense vessel evident. There is vascular calcification in each carotid siphon region. Skull: Bony calvarium appears intact with limitations due to motion artifact. Sinuses/Orbits: There is opacification in several ethmoid air cells. Other visualized paranasal sinuses are clear. Orbits appear grossly symmetric bilaterally. Other: Mastoid air cells are clear. IMPRESSION: Motion artifact makes this study less than optimal. There are areas of frontal and parietal lobe sulcal atrophy with ventricles appearing within normal limits. Mild periventricular small vessel disease evident. No acute infarct. No mass or hemorrhage. There are foci of arterial vascular calcification. There is opacification in several ethmoid air cells. Electronically Signed   By: Lowella Grip III M.D.   On: 01/22/2019 13:14    EKG: Independently reviewed.  NSR with rate 74; PVCs, nonspecific ST changes with no evidence of acute ischemia   Labs on Admission: I have personally reviewed the available labs and imaging studies at the time of the admission.  Pertinent labs:   Glucose 117 WBC 8.4 Hgb 15.1 INR 2.6 UA: WNL UDS + for BZD, opiates   Assessment/Plan Principal Problem:   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 Active Problems:   HTN (hypertension)   Obesity, unspecified   Long term (current) use of anticoagulants   Chronic combined systolic and diastolic CHF (congestive heart failure) (HCC)   Sleep disturbance   Delirium -Patient presenting with clearly AMS - she is tangential and unable to answer questions in repetition, but is able to at least start answering a question correctly before  she gets distracted again -Evaluation thus far unremarkable -There is no other evidence of infection at this time -Suspect medication misadventure, likely associated with sleep medications and likely exacerbated by chronic sleep deprivation -Primary psychiatric illness would be unusual at her age; she does have h/o depression, but this level of AMS is uncharacteristic for her and she also does not have clear psychosis -Based on unremarkable evaluation with current ability to protect her airway, will observe for now on telemetry  -Will give 5 mg IV Valium now and then provide prn Valium and Haldol -Based on her level of confusion and wandering behavior, she will need a 1:1 safety sitter -50% of patients with delirium while hospitalized will be institutionalized at 6 months, and these patients have a 25% mortality at 6 months -The family would benefit from being referred to the [facility] and also provided with the [employer] website  Sleep disturbance -She has chronic sleep issues -She was previously taking Ambien 10 mg, but a recent physician was concerned that this was excessive dosing (it is not recommended to give >5mg  Ambien to patients >65) and so she was changed to [place] -She has not found the Sonata to be efficacious and so escalates the dose -She also takes concurrent Valium, which she did last night -She also took an additional sleep medication, which may have contained Benadryl -Acute on chronic sleep deprivation is likely contributing to current presentation, in addition to medication misadventure  Depression -Patient with long-standing depression without prior h/o psychosis or similarly AMS -Denies SI or overdose attempt -Acknowledges significant life stressors, which may also be contributing to today's presentation -Will continue both Celexa (  resume today) and Wellbutrin (resume tomorrow) for now -Consider inpatient vs. Outpatient psychiatry consultation  H/o  DVT/PE, on [facility] -Remote history -Continue Coumadin, dosing per pharmacy  Hyperthyroidism -Continue Tapazole for now -Will check TSH and free T4  Chronic combined CHF -She is participating in the [place] [facility] was in 2/17 with EF 35-40% and prior h/o grade 1 diastolic dysfunction  HTN -Continue Toprol XL  Obesity -BMI 41 -Suggest bariatric medicine and/or surgery referral as an outpatient      Note: This patient has been tested and is pending for the novel coronavirus COVID-19.  DVT prophylaxis: Coumadin Code Status:  Full  Family Communication: Daughter was present throughout evaluation  Disposition Plan:  Home once clinically improved Consults called: None  Admission status: It is my clinical opinion that referral for OBSERVATION is reasonable and necessary in this patient based on the above information provided. The aforementioned taken together are felt to place the patient at high risk for further clinical deterioration. However it is anticipated that the patient may be medically stable for discharge from the hospital within 24 to 48 hours.    Karmen Bongo MD [facility]   How to contact the [facility] Attending or Consulting provider [place] or covering provider during after hours [place], for this patient?  1. Check the care team in [facility] and look for a) attending/consulting [facility] provider listed and b) the [facility] team listed 2. Log into [email or web address] and use [place]'s universal password to access. If you do not have the password, please contact the hospital operator. 3. Locate the [facility] provider you are looking for under [facility] and page to a number that you can be directly reached. 4. If you still have difficulty reaching the provider, please page the [facility] (Director on Call) for the Hospitalists listed on amion for assistance.   01/22/2019, 4:02 PM

## 2019-01-22 NOTE — ED Provider Notes (Signed)
[facility] EMERGENCY DEPARTMENT Provider Note   CSN: [id number] Arrival date & time: 01/22/19  1054     History   Chief Complaint Chief Complaint  Patient presents with   Altered Mental Status   flight of ideas   bizarre behavior    HPI Nicole Gibbs is a 73 y.o. 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  Patient is oriented to name, year only.  She denies any pain.  Specifically she denies any headache, chest pain, abdominal pain, vomiting, diarrhea.  She denies any recent falls.  She is tangential and difficult historian.  Level 5 caveat due to altered mental status.  History is obtained from triage note per EMS daughter called 911 because she found patient wandering around the house having flight of ideas and not making any sense.  Unknown last seen normal.     HPI  Past Medical History:  Diagnosis Date   Arthritis    knees   Chronic combined systolic and diastolic CHF (congestive heart failure) ([place])    a. 07/2012 Echo: EF 55-60%, Gr1 DD;  b. 06/2015 Echo: EF 35-40%, triv AI, Mod MR, mod dil LA, mildly reduced RV.   DVT, bilateral lower limbs ([place])    H/O cardiovascular stress test    a. 07/2012 MV: fixed mid-dist anterior and apical defect - scar vs attenuation, distal ant HK, no reversibility, EF 51%-->low risk.   Hypertensive heart disease    Nocturia    Normal coronary arteries    by cardiac catheterization 09/03/15   Obese    Postmenopausal vaginal bleeding    Pulmonary embolism, bilateral (HCC)    a. chronic coumadin.   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 01/22/2019   Sleep disturbance 01/22/2019   Abnormal stress test    Dyspnea on exertion    Systolic CHF, chronic ([place]) 07/06/2015   Acute on  chronic combined systolic and diastolic congestive heart failure, NYHA class 3 (HCC)    Symptomatic anemia 07/03/2015   Thrombocytosis ([place]) 07/03/2015   Hypertensive heart disease    Chronic combined systolic and diastolic CHF (congestive heart failure) (HCC)    Cough    GI bleed 05/06/2015   Acute GI bleeding 05/06/2015   Acute blood loss anemia 05/06/2015   History of pulmonary embolism 05/06/2015   Long term (current) use of anticoagulants 08/20/2012   DVT (deep venous thrombosis), bilateral 08/20/2012   Vaginal bleeding- s/p urgent D&C Feb 5th 2014 08/08/2012   Obesity, unspecified 08/08/2012   Dyspnea on exertion 08/08/2012   DVT, bilat - office dopplers August 29, 2012 29-Aug-2012   Pulmonary embolism, Bilateral 08/29/2012   HTN (hypertension) 2012-08-29   Nonspecific abnormal  (EKG), negative nucluear stress test 08/29/12 08-29-12   Family history of coronary artery bypass surgery Aug 29, 2012   Family history of sudden death (sister) Aug 29, 2012    Past Surgical History:  Procedure Laterality Date   CARDIAC CATHETERIZATION N/A 09/03/2015   Procedure: Left Heart Cath and Coronary Angiography;  Surgeon: [name], MD;  Location: [facility];  Service: Cardiovascular;  Laterality: N/A;   CARDIOVASCULAR STRESS TEST  08-29-2012  DR HILTY/ DR BERRY   LOW RISK NUCLEAR STUDY/ NO ISCHEMIA/ FIXED MID TO DISTAL ANTERIOR AND APICAL DEFECT  MAY REPRESENT SCAR VS BREAST ATTENUATION ARTIFACT/  MILD DISTAL ANTERIOR HYPOKINESIS/ EF 51%   CESAREAN SECTION  1992   COLONOSCOPY N/A 05/08/2015   Procedure: COLONOSCOPY;  Surgeon: Laurence Spates, MD;  Location: [facility];  Service: Endoscopy;  Laterality: N/A;   DILATION AND CURETTAGE OF UTERUS  06/20/2012   Procedure: DILATATION AND CURETTAGE;  Surgeon: [name], MD;  Location: [facility];  Service: Gynecology;  Laterality: N/A;   dilation and evacution  1988   missed ab   [place] N/A 09/04/2012   Procedure: DILATATION & CURETTAGE WITH THERMACHOICE ABLATION;  Surgeon: [name], MD;  Location: [facility];  Service: Gynecology;  Laterality: N/A;   ESOPHAGOGASTRODUODENOSCOPY N/A 05/07/2015   Procedure: ESOPHAGOGASTRODUODENOSCOPY (EGD);  Surgeon: Laurence Spates, MD;  Location: [facility];  Service: Endoscopy;  Laterality: N/A;   HYSTEROSCOPY W/D&C  03-24-2009   TRANSTHORACIC ECHOCARDIOGRAM  08-08-2012   LVSF NORMAL/ EF [id number]  GRADE I DIASTOLIC DYSFUNCTION/ MILD AV AND MV REGURG./  RVSF MILDLY REDUCED     OB History   No obstetric history on file.      Home Medications    Prior to Admission medications   Medication Sig Start Date End Date Taking? Authorizing Provider  acetaminophen (TYLENOL) 325 MG tablet Take 650 mg by mouth every 6 (six) hours as needed for moderate pain.   Yes [provider]  buPROPion (WELLBUTRIN XL) 150 MG 24 hr tablet Take 150 mg by mouth daily.   Yes [provider]  citalopram (CELEXA) 20 MG tablet Take 20 mg by mouth daily.   Yes Janie Morning, DO  furosemide (LASIX) 40 MG tablet Take 1 tablet (40 mg total) by mouth 2 (two) times daily. Patient taking differently: Take 40 mg by mouth as needed for fluid or edema.  02/19/18  Yes [name], MD  methimazole (TAPAZOLE) 5 MG tablet Take 5 mg by mouth daily.   Yes [provider]  metoprolol succinate (TOPROL-XL) 100 MG 24 hr tablet Take 1 tablet (100 mg total) by mouth daily. Take with or immediately following a meal. 07/25/18 01/22/19 Yes [name], MD  Multiple Vitamin (MULTIVITAMIN) tablet Take 1 tablet by mouth daily.   Yes [provider]  nitroGLYCERIN (NITROSTAT) 0.4 MG SL tablet Place 1 tablet (0.4 mg total) under the tongue every 5 (five) minutes as needed for chest pain. 06/20/14  Yes [name], MD  nystatin (MYCOSTATIN/NYSTOP) 100000 UNIT/GM POWD Apply to your groin TID Patient  taking differently: Apply 1 Bottle topically 3 (three) times daily as needed (groin).  07/09/15  Yes Thurnell Lose, MD  potassium chloride SA (K-DUR,KLOR-CON) 20 MEQ tablet Take 2 tablets (40 mEq total) by mouth daily. Patient taking differently: Take 40 mEq by mouth as needed (when taking furosemid).  07/10/15  Yes Thurnell Lose, MD  progesterone (PROMETRIUM) 200 MG capsule Take 200 mg by mouth at bedtime.    Yes [provider]  warfarin (COUMADIN) 3 MG tablet Take 1 to 1.5 tablets by mouth daily as directed Patient taking differently: Take 3-4.5 mg by mouth See admin instructions. Take 1 tab (3mg ) by mouth all days, EXCEPT on Monday take 1 1/2 (4.5mg ) 01/07/19  Yes [name], MD  AMBULATORY NON FORMULARY MEDICATION Take 10 mg by mouth once. Medication Name: Verciguat 10mg  tablet daily [facility] Heart Failure Study) 11/03/15 11/03/15  Larey Dresser, MD    Family History Family History  Problem Relation Age of Onset   Coronary artery disease Father 61   Stroke Father    Hypertension Mother    Mental illness Mother        anxiety   Sudden death Sister     Social History Social History   Tobacco Use   Smoking status: Never Smoker   Smokeless tobacco: Never Used  Substance Use Topics   Alcohol use: No   Drug use: No     Allergies   Keflex [cephalexin] and Sulfa antibiotics   Review of Systems Review of Systems  Unable to perform ROS: Mental status change  All other systems reviewed and are negative.    Physical Exam Updated Vital Signs BP (!) 160/90 (BP Location: Left Arm)    Pulse 70    Temp 98.2 F (36.8 C) (Oral)    Resp 20    Ht 5\' 2"  (1.575 m)    Wt 102.1 kg    SpO2 96%    BMI 41.15 kg/m   Physical Exam Vitals signs and nursing note reviewed.  Constitutional:      Appearance: She is well-developed.     Comments: Non toxic, awake   HENT:     Head: Normocephalic and atraumatic.     Comments: No signs of facial or scalp trauma      Nose: Nose normal.     Mouth/Throat:     Comments: Lips and MM very dry. No intraoral or tongue injury  Eyes:     Conjunctiva/sclera: Conjunctivae normal.  Neck:     Musculoskeletal: Normal range of motion.  Cardiovascular:     Rate and Rhythm: Normal rate and regular rhythm.     Comments: 1+ DP and radial pulses bilaterally  Pulmonary:     Effort: Pulmonary effort is normal.     Breath sounds: Normal breath sounds.  Abdominal:     General: Bowel sounds are normal.     Palpations: Abdomen is soft.     Tenderness: There is no abdominal tenderness.     Comments: No G/R/R. No suprapubic or CVA tenderness. Negative Murphy's and McBurney's. Active BS to lower quadrants.   Musculoskeletal: Normal range of motion.  Skin:    General: Skin is warm and dry.     Capillary Refill: Capillary refill takes less than 2 seconds.  Neurological:     Mental Status: She is alert. She is disoriented.     Comments:  Alert and oriented to self, place only.  Speech is fluent without dysarthria or dysphasia. Strength 5/5 with hand grip and ankle F/E.   Sensation to light touch intact in face, hands and feet. Sits up and gets out of bed with minimal assistance. Take 5+ steps on her own.  No pronator drift.  Normal finger-to-nose.  CN I not tested CN II: unable to be tested due to poor patient cooperation CN III, IV, VI PEERL and EOMs intact bilaterally CN V light touch intact in all 3 divisions of trigeminal nerve CN VII facial movements symmetric CN VIII not tested CN IX, X no uvula deviation, symmetric rise of soft palate  CN XI 5/5 SCM and trapezius strength bilaterally  CN XII Midline tongue protrusion, symmetric L/R movements  Psychiatric:        Mood and Affect: Affect is labile.        Speech: Speech is tangential.        Behavior: Behavior normal.        Judgment: Judgment is inappropriate.  Comments: Intermittently can answer closed ended questions appropriately with "yes" "no".  Tangential when asked open ended questions.  "so many things going on" "look at that little boy"       ED Treatments / Results  Labs (all labs ordered are listed, but only abnormal results are displayed) Labs Reviewed  PROTIME-INR - Abnormal; Notable for the following components:      Result Value   Prothrombin Time 27.5 (*)    INR 2.6 (*)    All other components within normal limits  CBC WITH DIFFERENTIAL/PLATELET - Abnormal; Notable for the following components:   RBC 5.18 (*)    Hemoglobin 15.1 (*)    HCT 47.4 (*)    All other components within normal limits  BASIC METABOLIC PANEL - Abnormal; Notable for the following components:   Glucose, Bld 117 (*)    Calcium 8.5 (*)    All other components within normal limits  RAPID URINE DRUG SCREEN, HOSP PERFORMED - Abnormal; Notable for the following components:   Opiates POSITIVE (*)    Benzodiazepines POSITIVE (*)    All other components within normal limits  URINE CULTURE  SARS CORONAVIRUS 2 (HOSPITAL ORDER, [facility])  ETHANOL  URINALYSIS, ROUTINE W REFLEX MICROSCOPIC  TSH  BASIC METABOLIC PANEL  CBC  T4, FREE    EKG None  Radiology Ct Head Wo Contrast  Result Date: 01/22/2019 CLINICAL DATA:  Altered mental status/disorientation EXAM: CT HEAD WITHOUT CONTRAST TECHNIQUE: Contiguous axial images were obtained from the base of the skull through the vertex without intravenous contrast. COMPARISON:  None. FINDINGS: Note that there is a degree of motion artifact. Brain: The ventricles appear normal in size and configuration. There is, however, a degree of frontal and parietal lobe sulcal atrophy bilaterally. There is no intracranial mass, hemorrhage, extra-axial fluid collection, or midline shift. There is slight small vessel disease in the centra semiovale bilaterally. Elsewhere brain parenchyma appears unremarkable. No acute infarct evident. Vascular: No hyperdense vessel evident. There is vascular  calcification in each carotid siphon region. Skull: Bony calvarium appears intact with limitations due to motion artifact. Sinuses/Orbits: There is opacification in several ethmoid air cells. Other visualized paranasal sinuses are clear. Orbits appear grossly symmetric bilaterally. Other: Mastoid air cells are clear. IMPRESSION: Motion artifact makes this study less than optimal. There are areas of frontal and parietal lobe sulcal atrophy with ventricles appearing within normal limits. Mild periventricular small vessel disease evident. No acute infarct. No mass or hemorrhage. There are foci of arterial vascular calcification. There is opacification in several ethmoid air cells. Electronically Signed   By: Lowella Grip III M.D.   On: 01/22/2019 13:14    Procedures Procedures (including critical care time)  Medications Ordered in ED Medications  diazepam (VALIUM) injection 5 mg (5 mg Intravenous Given 01/22/19 1633)  metoprolol succinate (TOPROL-XL) 24 hr tablet 100 mg (has no administration in time range)  buPROPion (WELLBUTRIN XL) 24 hr tablet 150 mg (has no administration in time range)  citalopram (CELEXA) tablet 20 mg (has no administration in time range)  methimazole (TAPAZOLE) tablet 5 mg (has no administration in time range)  progesterone (PROMETRIUM) capsule 200 mg (has no administration in time range)  acetaminophen (TYLENOL) tablet 650 mg (has no administration in time range)    Or  acetaminophen (TYLENOL) suppository 650 mg (has no administration in time range)  docusate sodium (COLACE) capsule 100 mg (has no administration in time range)  ondansetron (ZOFRAN) tablet 4 mg (has  no administration in time range)    Or  ondansetron (ZOFRAN) injection 4 mg (has no administration in time range)  haloperidol lactate (HALDOL) injection 5 mg (has no administration in time range)     Initial Impression / Assessment and Plan / ED Course  I have reviewed the triage vital signs and the  nursing notes.  Pertinent labs & imaging results that were available during my care of the patient were reviewed by me and considered in my medical decision making (see chart for details).  Clinical Course as of Jan 21 1730  Tue Jan 22, 2019  1244 Call CT regarding delay.  States they do not have a scanner yet there is a level 1 trauma and a procedure.  They will put patient in next.   [CG]  [id number] Daughter at bedside.  Patient lives with daughter.  Daughter states that yesterday they both state up until 2 or 3 AM.  Daughter heard patient walking around 6 AM and went to see her, she noticed that she was walking around and talking nonsense.  She was also hallucinating and wanting to show her objects that were not there.  Daughter states that patient had a fall that she did not witness but she does not think she hit her head.  States that her mother has been depressed for about a year since her father's passing but is otherwise stable.  She always says that she wants to live.  Her mother told her this morning that she could not sleep so she took 2 Sonata pills, 2 Valium and one over-the-counter sleeping medicine but patient does not know the name of this.  R states patient has never behaved like this in the past.  She has never had any psychiatric crisis or breaks.  She usually takes Valium and Sonata for sleep at night.  Daughter denies any recent infectious symptoms such as fever, vomiting, diarrhea, cough.  Unsure about urinary symptoms.   [CG]  1400 Opiates(!): POSITIVE [CG]  1400 Benzodiazepines(!): POSITIVE [CG]  1400 Motion artifact makes this study less than optimal. There are areas of frontal and parietal lobe sulcal atrophy with ventricles appearing within normal limits. Mild periventricular small vessel disease evident. No acute infarct. No mass or hemorrhage.  There are foci of arterial vascular calcification. There is opacification in several ethmoid air cells.  CT Head Wo Contrast [CG]    1402 EKG not crossing over onto Epic. Reviewed by me and EDP. NSR, PVCs, lateral lead TW abnormalities seen in EKG 3 years ago    [CG]    Clinical Course User Index [CG] Kinnie Feil, PA-C   73 yo here with acute AMS, bizarre behavior, hallucination, tangential speech.  Oriented to self, year only.  No previous documented history of same.  Per daughter, she was last seen normal around 2-3 am.  H/o depression and insomnia.  On warfarin. Pt reported taking 2 valium, 2 Sonata and 1 OTC sleeping pill around 3-4 pm.  Daughter denies recent increased depression, SI or attempts to harm herself.   On exam she definitely has bizarre behavior, occasionally responds to questions appropriately (can tell me her PCP is Dr [name]) but mostly answers inappropriately.  Her speech is clear.  No neuro deficits.  She ambulated with slow cautious but steady gait. No signs of head trauma.  No rectal fever. No meningismus. Very dry mouth and MM.   Ddx includes traumatic head injury during fall vs occult infection vs polypharmacy.  I considered stroke less likely given benign neuro exam. No fever, meningismus to suggest meningitis.   ER work up reviewed by me essentially normal except mild hemoconcentration possibly from mild dehydration. Creatinine normal. Mild hyperglycemia without DKA. INR is therapeutic.  UA +opiates, benzodiazepines. No signs of infection in urine. CT head without acute traumatic injuries. EKG without new ischemic changes, TW abnormalities seen previously on other EKGs. No segmental prolongation.    Pt re-evaluated several times. Her bizarre behavior is unchanged. She is hallucinating.    Discussed with Dr Lorin Mercy who will admit for delirium / AMS.   Final Clinical Impressions(s) / ED Diagnoses   Final diagnoses:  Delirium    ED Discharge Orders    None       Arlean Hopping 01/22/19 1731    Davonna Belling, MD 01/25/19 2326

## 2019-01-22 NOTE — ED Notes (Signed)
Patient transported to CT 

## 2019-01-22 NOTE — ED Triage Notes (Signed)
To ED via [facility] from home-- daughter called 911 when she found pt wandering around house this am, not acting like herself- pt has flight of ideas-- states "you know [employer] was taken over, what [employer] wanted was solar panels in 2000, and the renters did n't want it but we own it"  Pt is aware that she is in the hospital, but is unaware of why she is here. Denies any pain or short of breath.   "this is important-- 40 yrs ago, we lived in [place], I forgot what I was talking about"  Pt seeing "Tails moving on wall"

## 2019-01-22 NOTE — Progress Notes (Signed)
ANTICOAGULATION CONSULT NOTE - Initial Consult  Pharmacy Consult for Warfarin Indication: pulmonary embolus and DVT  Allergies  Allergen Reactions  . Keflex [Cephalexin] Diarrhea  . Sulfa Antibiotics Hives    Patient Measurements: Height: 5\' 2"  (157.5 cm) Weight: 225 lb (102.1 kg) IBW/kg (Calculated) : 50.1 Heparin Dosing Weight: 74.5 kg  Vital Signs: Temp: 98.2 F (36.8 C) (09/08 1730) Temp Source: Oral (09/08 1730) BP: 160/90 (09/08 1730) Pulse Rate: 70 (09/08 1730)  Labs: Recent Labs    01/22/19 1112  HGB 15.1*  HCT 47.4*  PLT 310  LABPROT 27.5*  INR 2.6*  CREATININE 0.87    Estimated Creatinine Clearance: 66.4 mL/min (by C-G formula based on SCr of 0.87 mg/dL).   Medical History: Past Medical History:  Diagnosis Date  . Arthritis    knees  . Chronic combined systolic and diastolic CHF (congestive heart failure) ([place])    a. 07/2012 Echo: EF 55-60%, Gr1 DD;  b. 06/2015 Echo: EF 35-40%, triv AI, Mod MR, mod dil LA, mildly reduced RV.  . DVT, bilateral lower limbs ([place])   . H/O cardiovascular stress test    a. 07/2012 MV: fixed mid-dist anterior and apical defect - scar vs attenuation, distal ant HK, no reversibility, EF 51%-->low risk.  Marland Kitchen Hypertensive heart disease   . Nocturia   . Normal coronary arteries    by cardiac catheterization 09/03/15  . Obese   . Postmenopausal vaginal bleeding   . Pulmonary embolism, bilateral (HCC)    a. chronic coumadin.  . Rash    under breasts  . Thyroid nodule    no meds    Medications:  Scheduled:  . [START ON 01/23/2019] buPROPion  150 mg Oral Daily  . citalopram  20 mg Oral Daily  . docusate sodium  100 mg Oral BID  . methimazole  5 mg Oral Daily  . metoprolol succinate  100 mg Oral Daily  . progesterone  200 mg Oral QHS  . warfarin  3 mg Oral ONCE-1800  . [START ON 01/23/2019] Warfarin - Pharmacist Dosing Inpatient   Does not apply q1800    Assessment: 73 y.o. female with bizarre behavior and AMS. PMH  significant for bilateral DVTs and PE on warfarin PTA (4.5 mg on Mondays and 3 mg all other days) - last dose 9/7. Pharmacy consulted for warfarin dosing.   Admission INR 2.6 - therapeutic  Hgb 15.1, Plts 310  Goal of Therapy:  INR 2-3 Monitor platelets by anticoagulation protocol: Yes   Plan:  Warfarin 3 mg 1x tonight Daily INR Monitor for s/sx of bleeding  Lorel Monaco, PharmD PGY1 Ambulatory Care Resident [employer] # [phone]

## 2019-01-22 NOTE — ED Notes (Signed)
Pt arrived to Rm 53 via stretcher. Daughter, Caryl Asp, was initially at bedside but has now left. Stated pt took too many meds last night in an attempt to sleep. Denies SI attempt. Pt noted to be calm, pleasantly confused. Monitor applied to pt. NSR. TV turned on for pt. Dinner tray delivered - set up for pt. Pt able to feed herself. Pt voiced understanding to press call bell prior to attempting to get up out of bed. Call bell placed on pt's lap.

## 2019-01-22 NOTE — ED Notes (Signed)
Ptrepositioned in bed and de nies pain

## 2019-01-22 NOTE — ED Notes (Signed)
ED TO INPATIENT HANDOFF REPORT  ED Nurse Name and Phone #: [phone] Delma Officer Name/Age/Gender Nicole Gibbs 73 y.o. female Room/Bed: 053C/053C  Code Status   Code Status: Full Code  Home/SNF/Other Home Patient oriented to: self Is this baseline? Yes   Triage Complete: Triage complete  Chief Complaint ams  Triage Note To ED via [facility] from home-- daughter called 911 when she found pt wandering around house this am, not acting like herself- pt has flight of ideas-- states "you know [employer] was taken over, what [employer] wanted was solar panels in 2000, and the renters did n't want it but we own it"  Pt is aware that she is in the hospital, but is unaware of why she is here. Denies any pain or short of breath.   "this is important-- 40 yrs ago, we lived in [place], I forgot what I was talking about"  Pt seeing "Tails moving on wall"    Allergies Allergies  Allergen Reactions  . Keflex [Cephalexin] Diarrhea  . Sulfa Antibiotics Hives    Level of Care/Admitting Diagnosis ED Disposition    ED Disposition Condition Comment   Admit  Hospital Area: [facility] [[id number]]  Level of Care: Telemetry Medical [104]  I expect the patient will be discharged within 24 hours: Yes  LOW acuity---Tx typically complete <24 hrs---ACUTE conditions typically can be evaluated <24 hours---LABS likely to return to acceptable levels <24 hours---IS near functional baseline---EXPECTED to return to current living arrangement---NOT newly hypoxic: Meets criteria for 5C-Observation unit  Covid Evaluation: Asymptomatic Screening Protocol (No Symptoms)  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 Arthritis    knees  . Chronic combined  systolic and diastolic CHF (congestive heart failure) ([place])    a. 07/2012 Echo: EF 55-60%, Gr1 DD;  b. 06/2015 Echo: EF 35-40%, triv AI, Mod MR, mod dil LA, mildly reduced RV.  . DVT, bilateral lower limbs ([place])   . H/O cardiovascular stress test    a. 07/2012 MV: fixed mid-dist anterior and apical defect - scar vs attenuation, distal ant HK, no reversibility, EF 51%-->low risk.  Marland Kitchen Hypertensive heart disease   . Nocturia   . Normal coronary arteries    by cardiac catheterization 09/03/15  . Obese   . Postmenopausal vaginal bleeding   . Pulmonary embolism, bilateral ([facility])    a. chronic coumadin.  . Rash    under breasts  . Thyroid nodule    no meds   Past Surgical History:  Procedure Laterality Date  . CARDIAC CATHETERIZATION N/A 09/03/2015   Procedure: Left Heart Cath and Coronary Angiography;  Surgeon: [name], MD;  Location: [facility];  Service: Cardiovascular;  Laterality: N/A;  . CARDIOVASCULAR STRESS TEST  08-07-2012  DR HILTY/ DR BERRY   LOW RISK NUCLEAR STUDY/ NO ISCHEMIA/ FIXED MID TO DISTAL ANTERIOR AND APICAL DEFECT MAY REPRESENT SCAR VS BREAST ATTENUATION ARTIFACT/  MILD DISTAL ANTERIOR HYPOKINESIS/ EF 51%  . CESAREAN SECTION  1992  . COLONOSCOPY N/A 05/08/2015   Procedure: COLONOSCOPY;  Surgeon: Laurence Spates, MD;  [facility] END[facility]COPY;  Service: Endoscopy;  Laterality: N/A;  . DILATION AND CURETTAGE OF UTERUS  06/20/2012   Procedure: DILATATION AND CURETTAGE;  Surgeon: [name],  MD;  Location: [facility];  Service: Gynecology;  Laterality: N/A;  . dilation and evacution  1988   missed ab  . DILITATION & CURRETTAGE/HYSTROSCOPY WITH THERMACHOICE ABLATION N/A 09/04/2012   Procedure: DILATATION & CURETTAGE WITH THERMACHOICE ABLATION;  Surgeon: [name], MD;  Location: [facility];  Service: Gynecology;  Laterality: N/A;  . ESOPHAGOGASTRODUODENOSCOPY N/A 05/07/2015   Procedure: ESOPHAGOGASTRODUODENOSCOPY (EGD);  Surgeon: Laurence Spates, MD;  Location: [facility] ENDOSCOPY;  Service: Endoscopy;  Laterality: N/A;  . HYSTEROSCOPY W/D&C  03-24-2009  . TRANSTHORACIC ECHOCARDIOGRAM  08-08-2012   LVSF NORMAL/ EF [id number]  GRADE I DIASTOLIC DYSFUNCTION/ MILD AV AND MV REGURG./  RVSF MILDLY REDUCED     A IV Location/Drains/Wounds Patient Lines/Drains/Airways Status   Active Line/Drains/Airways    Name:   Placement date:   Placement time:   Site:   Days:   Peripheral IV 01/22/19 Left Forearm   01/22/19    1151    Forearm   less than 1          Intake/Output Last 24 hours No intake or output data in the 24 hours ending 01/22/19 1753  Labs/Imaging Results for orders placed or performed during the hospital encounter of 01/22/19 (from the past 48 hour(s))  Urinalysis, Routine w reflex microscopic     Status: None   Collection Time: 01/22/19 11:11 AM  Result Value Ref Range   Color, Urine YELLOW YELLOW   APPearance CLEAR CLEAR   Specific Gravity, Urine 1.019 1.005 - 1.030   pH 6.0 5.0 - 8.0   Glucose, UA NEGATIVE NEGATIVE mg/dL   Hgb urine dipstick NEGATIVE NEGATIVE   Bilirubin Urine NEGATIVE NEGATIVE   Ketones, ur NEGATIVE NEGATIVE mg/dL   Protein, ur NEGATIVE NEGATIVE mg/dL   Nitrite NEGATIVE NEGATIVE   Leukocytes,Ua NEGATIVE NEGATIVE    Comment: Performed at [facility] [address] [place], [place] [address]  Rapid urine drug screen (hospital performed)     Status: Abnormal   Collection Time: 01/22/19 11:11 AM  Result Value Ref Range   Opiates POSITIVE (A) NONE DETECTED   Cocaine NONE DETECTED NONE DETECTED   Benzodiazepines POSITIVE (A) NONE DETECTED   Amphetamines NONE DETECTED NONE DETECTED   Tetrahydrocannabinol NONE DETECTED NONE DETECTED   Barbiturates NONE DETECTED NONE DETECTED    Comment: (NOTE) DRUG SCREEN FOR MEDICAL PURPOSES ONLY.  IF CONFIRMATION IS NEEDED FOR ANY PURPOSE, NOTIFY LAB WITHIN 5 DAYS. LOWEST DETECTABLE LIMITS FOR URINE DRUG SCREEN Drug Class                     Cutoff  (ng/mL) Amphetamine and metabolites    1000 Barbiturate and metabolites    200 Benzodiazepine                 [id number] Tricyclics and metabolites     300 Opiates and metabolites        300 Cocaine and metabolites        300 THC                            50 Performed at [facility], [place] [address] [place], [place] [address]   Protime-INR     Status: Abnormal   Collection Time: 01/22/19 11:12 AM  Result Value Ref Range   Prothrombin Time 27.5 (H) 11.4 - 15.2 seconds   INR 2.6 (H) 0.8 - 1.2    Comment: (NOTE) INR  goal varies based on device and disease states. Performed at [facility], [place] [address] [place], [place] [address]   CBC with Differential     Status: Abnormal   Collection Time: 01/22/19 11:12 AM  Result Value Ref Range   WBC 8.4 4.0 - 10.5 K/uL   RBC 5.18 (H) 3.87 - 5.11 MIL/uL   Hemoglobin 15.1 (H) 12.0 - 15.0 g/dL   HCT 47.4 (H) 36.0 - 46.0 %   MCV 91.5 80.0 - 100.0 fL   MCH 29.2 26.0 - 34.0 pg   MCHC 31.9 30.0 - 36.0 g/dL   RDW 13.6 11.5 - 15.5 %   Platelets 310 150 - 400 K/uL   nRBC 0.0 0.0 - 0.2 %   Neutrophils Relative % 76 %   Neutro Abs 6.4 1.7 - 7.7 K/uL   Lymphocytes Relative 16 %   Lymphs Abs 1.3 0.7 - 4.0 K/uL   Monocytes Relative 7 %   Monocytes Absolute 0.6 0.1 - 1.0 K/uL   Eosinophils Relative 1 %   Eosinophils Absolute 0.0 0.0 - 0.5 K/uL   Basophils Relative 0 %   Basophils Absolute 0.0 0.0 - 0.1 K/uL   Immature Granulocytes 0 %   Abs Immature Granulocytes 0.02 0.00 - 0.07 K/uL    Comment: Performed at [facility] [address], [place], [place] [id number]  Basic metabolic panel     Status: Abnormal   Collection Time: 01/22/19 11:12 AM  Result Value Ref Range   Sodium 139 135 - 145 mmol/L   Potassium 3.9 3.5 - 5.1 mmol/L   Chloride 106 98 - 111 mmol/L   CO2 24 22 - 32 mmol/L   Glucose, Bld 117 (H) 70 - 99 mg/dL   BUN 14 8 - 23 mg/dL   Creatinine, Ser 0.87 0.44 - 1.00 mg/dL   Calcium 8.5 (L) 8.9 - 10.3 mg/dL   GFR  calc non Af Amer >60 >60 mL/min   GFR calc Af Amer >60 >60 mL/min   Anion gap 9 5 - 15    Comment: Performed at [facility] [address], [place], [place] [address]  Ethanol     Status: None   Collection Time: 01/22/19 11:50 AM  Result Value Ref Range   Alcohol, Ethyl (B) <10 <10 mg/dL    Comment: (NOTE) Lowest detectable limit for serum alcohol is 10 mg/dL. For medical purposes only. Performed at [facility], [place] [address], [place], [place] [address]    Ct Head Wo Contrast  Result Date: 01/22/2019 CLINICAL DATA:  Altered mental status/disorientation EXAM: CT HEAD WITHOUT CONTRAST TECHNIQUE: Contiguous axial images were obtained from the base of the skull through the vertex without intravenous contrast. COMPARISON:  None. FINDINGS: Note that there is a degree of motion artifact. Brain: The ventricles appear normal in size and configuration. There is, however, a degree of frontal and parietal lobe sulcal atrophy bilaterally. There is no intracranial mass, hemorrhage, extra-axial fluid collection, or midline shift. There is slight small vessel disease in the centra semiovale bilaterally. Elsewhere brain parenchyma appears unremarkable. No acute infarct evident. Vascular: No hyperdense vessel evident. There is vascular calcification in each carotid siphon region. Skull: Bony calvarium appears intact with limitations due to motion artifact. Sinuses/Orbits: There is opacification in several ethmoid air cells. Other visualized paranasal sinuses are clear. Orbits appear grossly symmetric bilaterally. Other: Mastoid air cells are clear. IMPRESSION: Motion artifact makes this study less than optimal. There are areas  of frontal and parietal lobe sulcal atrophy with ventricles appearing within normal limits. Mild periventricular small vessel disease evident. No acute infarct. No mass or hemorrhage. There are foci of arterial vascular calcification. There is opacification in several ethmoid air  cells. Electronically Signed   By: Lowella Grip III M.D.   On: 01/22/2019 13:14    Pending Labs Unresulted Labs (From admission, onward)    Start     Ordered   01/23/19 [id number]  Basic metabolic panel  Tomorrow morning,   R     01/22/19 1542   01/23/19 0500  CBC  Tomorrow morning,   R     01/22/19 1542   01/22/19 1613  TSH  Once,   STAT     01/22/19 1615   01/22/19 1613  T4, free  Once,   R     01/22/19 1613   01/22/19 1406  SARS Coronavirus 2 [facility] order, Performed in [facility] hospital lab) Nasopharyngeal Nasopharyngeal Swab  (Symptomatic/High Risk of Exposure/Tier 1 Patients Labs with Precautions)  Once,   STAT    Question Answer Comment  Is this test for diagnosis or screening Screening   Symptomatic for COVID-19 as defined by CDC No   Hospitalized for COVID-19 No   Admitted to ICU for COVID-19 No   Previously tested for COVID-19 No   Resident in a congregate (group) care setting No   Employed in healthcare setting No   Pregnant No      01/22/19 1405   01/22/19 1111  Urine culture  ONCE - STAT,   STAT     01/22/19 1110          Vitals/Pain Today's Vitals   01/22/19 1230 01/22/19 1318 01/22/19 1344 01/22/19 1730  BP:  (!) 159/90  (!) 160/90  Pulse: 69 88  70  Resp: 16 (!) 24  20  Temp:    98.2 F (36.8 C)  TempSrc:    Oral  SpO2: 95% 95%  96%  Weight:      Height:      PainSc:  0-No pain 0-No pain     Isolation Precautions No active isolations  Medications Medications  diazepam (VALIUM) injection 5 mg (5 mg Intravenous Given 01/22/19 1633)  metoprolol succinate (TOPROL-XL) 24 hr tablet 100 mg (100 mg Oral Given 01/22/19 1732)  buPROPion (WELLBUTRIN XL) 24 hr tablet 150 mg (has no administration in time range)  citalopram (CELEXA) tablet 20 mg (20 mg Oral Given 01/22/19 1732)  methimazole (TAPAZOLE) tablet 5 mg (has no administration in time range)  progesterone (PROMETRIUM) capsule 200 mg (has no administration in time range)  acetaminophen (TYLENOL)  tablet 650 mg (has no administration in time range)    Or  acetaminophen (TYLENOL) suppository 650 mg (has no administration in time range)  docusate sodium (COLACE) capsule 100 mg (100 mg Oral Given 01/22/19 1731)  ondansetron (ZOFRAN) tablet 4 mg (has no administration in time range)    Or  ondansetron (ZOFRAN) injection 4 mg (has no administration in time range)  haloperidol lactate (HALDOL) injection 5 mg (has no administration in time range)    Mobility walks with person assist High fall risk   Focused Assessments Cardiac Assessment Handoff:  Cardiac Rhythm: Normal sinus rhythm Lab Results  Component Value Date   TROPONINI <0.30 08/07/2012   No results found for: DDIMER Does the Patient currently have chest pain? No     R Recommendations: See Admitting Provider Note  Report given to: Anderson Malta RN  Additional Notes: confused

## 2019-01-22 NOTE — ED Notes (Addendum)
Dr. Lorin Mercy at bedaside Pt continues confused and making bizzare statesments unrelated to conversation.

## 2019-01-23 DIAGNOSIS — R410 Disorientation, unspecified: Secondary | ICD-10-CM | POA: Diagnosis not present

## 2019-01-23 LAB — BASIC METABOLIC PANEL
Anion gap: 11 (ref 5–15)
BUN: 11 mg/dL (ref 8–23)
CO2: 23 mmol/L (ref 22–32)
Calcium: 8.3 mg/dL — ABNORMAL LOW (ref 8.9–10.3)
Chloride: 104 mmol/L (ref 98–111)
Creatinine, Ser: 0.76 mg/dL (ref 0.44–1.00)
GFR calc Af Amer: >60 mL/min (ref >60)
GFR calc non Af Amer: >60 mL/min (ref >60)
Glucose, Bld: 105 mg/dL — ABNORMAL HIGH (ref 70–99)
Potassium: 3.5 mmol/L (ref 3.5–5.1)
Sodium: 138 mmol/L (ref 135–145)

## 2019-01-23 LAB — URINE CULTURE: Culture: NO GROWTH

## 2019-01-23 LAB — CBC
HCT: 46.5 % — ABNORMAL HIGH (ref 36.0–46.0)
Hemoglobin: 14.6 g/dL (ref 12.0–15.0)
MCH: 28.6 pg (ref 26.0–34.0)
MCHC: 31.4 g/dL (ref 30.0–36.0)
MCV: 91 fL (ref 80.0–100.0)
Platelets: 304 10*3/uL (ref 150–400)
RBC: 5.11 MIL/uL (ref 3.87–5.11)
RDW: 13.5 % (ref 11.5–15.5)
WBC: 7.7 10*3/uL (ref 4.0–10.5)
nRBC: 0 % (ref 0.0–0.2)

## 2019-01-23 LAB — PROTIME-INR
INR: 2.3 — ABNORMAL HIGH (ref 0.8–1.2)
Prothrombin Time: 25.3 seconds — ABNORMAL HIGH (ref 11.4–15.2)

## 2019-01-23 MED ORDER — FUROSEMIDE 40 MG PO TABS: 40.0000 mg | ORAL_TABLET | ORAL | Status: DC | PRN

## 2019-01-23 MED ORDER — POTASSIUM CHLORIDE CRYS ER 20 MEQ PO TBCR: 40.0000 meq | EXTENDED_RELEASE_TABLET | ORAL | Status: DC | PRN

## 2019-01-23 MED ORDER — WARFARIN SODIUM 4 MG PO TABS: 4.5000 mg | ORAL_TABLET | ORAL | Status: DC

## 2019-01-23 MED ORDER — WARFARIN SODIUM 3 MG PO TABS
3.0000 mg | ORAL_TABLET | ORAL | Status: DC
  Administered 2019-01-23: 18:00:00 3 mg via ORAL
  Filled 2019-01-23: qty 1

## 2019-01-23 MED ORDER — PNEUMOCOCCAL VAC POLYVALENT 25 MCG/0.5ML IJ INJ: 0.5000 mL | INJECTION | INTRAMUSCULAR | Status: DC

## 2019-01-23 MED ORDER — HYDRALAZINE HCL 20 MG/ML IJ SOLN
5.0000 mg | Freq: Four times a day (QID) | INTRAMUSCULAR | Status: DC | PRN
  Administered 2019-01-23: 5 mg via INTRAVENOUS
  Filled 2019-01-23: qty 1

## 2019-01-23 NOTE — Discharge Instructions (Addendum)

## 2019-01-23 NOTE — Care Management Obs Status (Signed)
[facility] NOTIFICATION   Patient Details  Name: Nicole Gibbs MRN: [id number] Date of Birth: [birth year]   Medicare Observation Status Notification Given:  Yes    Pollie Friar, RN 01/23/2019, 3:12 PM

## 2019-01-23 NOTE — Evaluation (Signed)
Physical Therapy Evaluation Patient Details Name: Nicole Gibbs MRN: [id number] DOB: 11/06/1945 Today's Date: 01/23/2019   History of Present Illness  Pt is a 73 y/o female admitted secondary to delirium and AMS, thought to be related to medication. PMH includes HTN, CHF, PE, and obesity.   Clinical Impression  Pt admitted secondary to problem above with deficits below. Pt alert and oriented, however, very tangential during session. Required cues to stay on task. Pt requiring min to min guard A for mobility using RW. SOB noted, however, oxygen sats at 96-97% on RA. Feel pt would benefit from HHPT to work on balance and strengthening activities. Educated about using RW at home to increase safety with mobility. Will continue to follow acutely to maximize functional mobility independence and safety.     Follow Up Recommendations Home health PT;Supervision/Assistance - 24 hour    Equipment Recommendations  None recommended by PT    Recommendations for Other Services       Precautions / Restrictions Precautions Precautions: Fall Restrictions Weight Bearing Restrictions: No      Mobility  Bed Mobility Overal bed mobility: Needs Assistance Bed Mobility: Supine to Sit;Sit to Supine     Supine to sit: Supervision Sit to supine: Supervision   General bed mobility comments: Supervision for safety.   Transfers Overall transfer level: Needs assistance Equipment used: Rolling walker (2 wheeled) Transfers: Sit to/from Stand Sit to Stand: Min guard         General transfer comment: Min guard for safety.   Ambulation/Gait Ambulation/Gait assistance: Min assist;Min guard Gait Distance (Feet): 150 Feet Assistive device: Rolling walker (2 wheeled) Gait Pattern/deviations: Step-through pattern;Decreased stride length;Trunk flexed Gait velocity: Decreased   General Gait Details: Slow, mildly unsteady gait. Increased shakiness noted in BLE at end of gait. Pt also with increased SOB,  however, oxygen sats at 96-97% on RA. Educated about using RW at home for increased safety and stability.   Stairs            Wheelchair Mobility    Modified Rankin (Stroke Patients Only)       Balance Overall balance assessment: Needs assistance Sitting-balance support: No upper extremity supported;Feet supported Sitting balance-Leahy Scale: Good     Standing balance support: Bilateral upper extremity supported;During functional activity Standing balance-Leahy Scale: Poor Standing balance comment: Reliant on BUE support                              Pertinent Vitals/Pain Pain Assessment: No/denies pain    Home Living Family/patient expects to be discharged to:: Private residence Living Arrangements: Children Available Help at Discharge: Family;Available 24 hours/day Type of Home: House Home Access: Level entry     Home Layout: Two level Home Equipment: Walker - 2 wheels;Cane - single point;Bedside commode;Wheelchair - power;Electric scooter      Prior Function Level of Independence: Independent with assistive device(s)         Comments: Pt reports using cane for ambulation      Hand Dominance        Extremity/Trunk Assessment   Upper Extremity Assessment Upper Extremity Assessment: Generalized weakness    Lower Extremity Assessment Lower Extremity Assessment: Generalized weakness    Cervical / Trunk Assessment Cervical / Trunk Assessment: Normal  Communication   Communication: No difficulties  Cognition Arousal/Alertness: Awake/alert Behavior During Therapy: WFL for tasks assessed/performed Overall Cognitive Status: No family/caregiver present to determine baseline cognitive functioning  General Comments: Alert and oriented X4, however, pt very chatty and very tangential.       General Comments      Exercises     Assessment/Plan    PT Assessment Patient needs continued PT  services  PT Problem List Decreased strength;Decreased balance;Decreased activity tolerance;Decreased mobility;Decreased knowledge of use of DME;Decreased knowledge of precautions;Decreased safety awareness       PT Treatment Interventions DME instruction;Gait training;Functional mobility training;Therapeutic activities;Therapeutic exercise;Balance training;Patient/family education    PT Goals (Current goals can be found in the Care Plan section)  Acute Rehab PT Goals Patient Stated Goal: to go home  PT Goal Formulation: With patient Time For Goal Achievement: 02/06/19 Potential to Achieve Goals: Good    Frequency Min 3X/week   Barriers to discharge        Co-evaluation               AM-PAC PT "6 Clicks" Mobility  Outcome Measure Help needed turning from your back to your side while in a flat bed without using bedrails?: None Help needed moving from lying on your back to sitting on the side of a flat bed without using bedrails?: None Help needed moving to and from a bed to a chair (including a wheelchair)?: A Little Help needed standing up from a chair using your arms (e.g., wheelchair or bedside chair)?: A Little Help needed to walk in hospital room?: A Little Help needed climbing 3-5 steps with a railing? : A Lot 6 Click Score: 19    End of Session Equipment Utilized During Treatment: Gait belt Activity Tolerance: Patient tolerated treatment well Patient left: in bed;with call bell/phone within reach;with bed alarm set Nurse Communication: Mobility status PT Visit Diagnosis: Unsteadiness on feet (R26.81);Muscle weakness (generalized) (M62.81)    Time: [id number] PT Time Calculation (min) (ACUTE ONLY): 20 min   Charges:   PT Evaluation $PT Eval Low Complexity: Bassett, PT, DPT  Acute Rehabilitation Services  Pager: [phone] Office: [phone]   Rudean Hitt 01/23/2019, 2:13 PM

## 2019-01-23 NOTE — Plan of Care (Signed)
Plan of care adequate for discharge.

## 2019-01-23 NOTE — Progress Notes (Signed)
ANTICOAGULATION CONSULT NOTE  Pharmacy Consult:  Coumadin Indication: pulmonary embolus and DVT  Allergies  Allergen Reactions  . Keflex [Cephalexin] Diarrhea  . Sulfa Antibiotics Hives    Patient Measurements: Height: 5\' 2"  (157.5 cm) Weight: 225 lb (102.1 kg) IBW/kg (Calculated) : 50.1  Vital Signs: Temp: 97.5 F (36.4 C) (09/09 0800) Temp Source: Oral (09/09 0800) BP: 146/83 (09/09 0800) Pulse Rate: 74 (09/09 0800)  Labs: Recent Labs    01/22/19 1112 01/23/19 0319  HGB 15.1* 14.6  HCT 47.4* 46.5*  PLT 310 304  LABPROT 27.5* 25.3*  INR 2.6* 2.3*  CREATININE 0.87 0.76    Estimated Creatinine Clearance: 72.2 mL/min (by C-G formula based on SCr of 0.76 mg/dL).   Assessment: 73 y.o. female with bizarre behavior and AMS.  Pharmacy consulted to continue Coumadin from PTA for history of bilateral DVTs and PEs.  INR remains therapeutic; no bleeding reported.  Goal of Therapy:  INR 2-3 Monitor platelets by anticoagulation protocol: Yes   Plan:  Coumadin 3mg  PO daily except 4.5mg  on Mon as at home Daily PT / INR for now Monitor for s/sx of bleeding  Tayon Parekh D. Mina Marble, PharmD, BCPS, [place] 01/23/2019, 8:51 AM

## 2019-01-23 NOTE — Progress Notes (Signed)
Patient being discharged home with home health physical therapy.  Patient being transported by her daughter.  IV removed with the catheter intact.  Discharge instructions and prescription information given with the patient verbalizing understanding.

## 2019-01-23 NOTE — Plan of Care (Signed)
Progressing towards goals

## 2019-01-23 NOTE — Discharge Summary (Signed)
Physician Discharge Summary  Starlight Harrison [id number] DOB: 09-15-1945 DOA: 01/22/2019  PCP: [name], MD  Admit date: 01/22/2019 Discharge date: 01/23/2019  Admitted From: home Discharge disposition: home   Recommendations for Outpatient Follow-Up:   1. Outpatient sleep study- due later this month 2. Sleep hygiene 3. 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  Diet recommendation: Low sodium, heart healthy.  Carbohydrate-modified  Wound care: None.  Code status: Full.   History of Present Illness:  Nicole Gibbs is a 73 y.o. female with medical history significant of PE on Coumadin; HTN; morbid obesity (BMI 41); depression; and chronic combined CHF presenting with AMS.  She urinated on the bed and is very agitated about that.  She went to bed late, about 2-3AM.  About 6AM, her daughter heard her walking around and talking to herself.  Her daughter thought she took too many sleeping pills.  She fell and it took about an hour to get her back to bed.  She continued to be agitated and get out of bed.  The daughter called her aunt and uncle and they recommended transport to the ER.  She has been confused all morning, but does seem to be clearing a bit.  She is vaguely aware of being in the hospital - understands a few things and then is also confused.  She is a bit flushed and intermittently nauseated.  Her daughter is worried because evaluation looks negative   Hospital Course by Problem:   Delirium -resolved -Evaluation thus far unremarkable -There is no other evidence of infection at this time -Suspect medication misadventure, likely associated with sleep medications and likely  exacerbated by chronic sleep deprivation  Sleep disturbance -She has chronic sleep issues -She was previously taking Ambien 10 mg, but a recent physician was concerned that this was excessive dosing (it is not recommended to give >5mg  Ambien to patients >65) and so she was changed to [employer] -She has not found the Sonata to be efficacious and so escalates the dose -She also takes concurrent Valium, which she did last night -She also took an additional pain medication that she has from a prior surgery 3 years ago -Acute on chronic sleep deprivation is likely contributing to current presentation, in addition to medication misadventure  Depression -Patient with long-standing depression without prior h/o psychosis or similarly AMS -Denies SI or overdose attempt -Acknowledges significant life stressors, which may also be contributing to today's presentation   H/o DVT/PE, on AC -Remote history -Continue Coumadin, dosing per pharmacy  Hyperthyroidism -Continue Tapazole for now -TSH and free T4 normal   HTN -Continue Toprol XL  Obesity -BMI 41 -Suggest bariatric medicine and/or surgery referral as an outpatient    Medical Consultants:      Discharge Exam:   Vitals:   01/23/19 0800   BP: (!) 146/83   Pulse: 74   Resp: 18   Temp: (!) 97.5 F (36.4 C)   SpO2:       General exam: Appears calm and comfortable.  Feels like she is back to her normal  The results of significant diagnostics from this hospitalization (including imaging, microbiology, ancillary and laboratory) are listed below for reference.  Procedures and Diagnostic Studies:   Ct Head Wo Contrast  Result Date: 01/22/2019 CLINICAL DATA:  Altered mental status/disorientation EXAM: CT HEAD WITHOUT CONTRAST TECHNIQUE: Contiguous axial images were obtained from the base of the skull through the vertex without intravenous contrast. COMPARISON:  None. FINDINGS: Note that there is a degree of motion  artifact. Brain: The ventricles appear normal in size and configuration. There is, however, a degree of frontal and parietal lobe sulcal atrophy bilaterally. There is no intracranial mass, hemorrhage, extra-axial fluid collection, or midline shift. There is slight small vessel disease in the centra semiovale bilaterally. Elsewhere brain parenchyma appears unremarkable. No acute infarct evident. Vascular: No hyperdense vessel evident. There is vascular calcification in each carotid siphon region. Skull: Bony calvarium appears intact with limitations due to motion artifact. Sinuses/Orbits: There is opacification in several ethmoid air cells. Other visualized paranasal sinuses are clear. Orbits appear grossly symmetric bilaterally. Other: Mastoid air cells are clear. IMPRESSION: Motion artifact makes this study less than optimal. There are areas of frontal and parietal lobe sulcal atrophy with ventricles appearing within normal limits. Mild periventricular small vessel disease evident. No acute infarct. No mass or hemorrhage. There are foci of arterial vascular calcification. There is opacification in several ethmoid air cells. Electronically Signed   By: Lowella Grip III M.D.   On: 01/22/2019 13:14     Labs:   Basic Metabolic Panel: Recent Labs  Lab 01/22/19 1112 01/23/19 0319  NA 139 138  K 3.9 3.5  CL 106 104  CO2 24 23  GLUCOSE 117* 105*  BUN 14 11  CREATININE 0.87 0.76  CALCIUM 8.5* 8.3*   GFR Estimated Creatinine Clearance: 72.2 mL/min (by C-G formula based on SCr of 0.76 mg/dL). Liver Function Tests: No results for input(s): AST, ALT, ALKPHOS, BILITOT, PROT, ALBUMIN in the last 168 hours. No results for input(s): LIPASE, AMYLASE in the last 168 hours. No results for input(s): AMMONIA in the last 168 hours. Coagulation profile Recent Labs  Lab 01/18/19 1531 01/22/19 1112 01/23/19 0319  INR 3.2* 2.6* 2.3*    CBC: Recent Labs  Lab 01/22/19 1112 01/23/19 0319  WBC 8.4 7.7   NEUTROABS 6.4  --   HGB 15.1* 14.6  HCT 47.4* 46.5*  MCV 91.5 91.0  PLT 310 304   Cardiac Enzymes: No results for input(s): CKTOTAL, CKMB, CKMBINDEX, TROPONINI in the last 168 hours. BNP: Invalid input(s): POCBNP CBG: No results for input(s): GLUCAP in the last 168 hours. D-Dimer No results for input(s): DDIMER in the last 72 hours. Hgb A1c No results for input(s): HGBA1C in the last 72 hours. Lipid Profile No results for input(s): CHOL, HDL, LDLCALC, TRIG, CHOLHDL, LDLDIRECT in the last 72 hours. Thyroid function studies Recent Labs    01/22/19 1613  TSH 3.347   Anemia work up No results for input(s): VITAMINB12, FOLATE, FERRITIN, TIBC, IRON, RETICCTPCT in the last 72 hours. Microbiology Recent Results (from the past 240 hour(s))  Urine culture     Status: None   Collection Time: 01/22/19 11:11 AM   Specimen: Urine, Random  Result Value Ref Range Status   Specimen Description URINE, RANDOM  Final   Special Requests NONE  Final   Culture   Final    NO GROWTH Performed at [facility], [address], [place], [place] [address]    Report Status 01/23/2019 FINAL  Final  SARS Coronavirus 2 [facility] order, Performed in [facility] hospital lab) Nasopharyngeal Nasopharyngeal Swab     Status:  None   Collection Time: 01/22/19  4:35 PM   Specimen: Nasopharyngeal Swab  Result Value Ref Range Status   SARS Coronavirus 2 NEGATIVE NEGATIVE Final    Comment: (NOTE) If result is NEGATIVE SARS-CoV-2 target nucleic acids are NOT DETECTED. The SARS-CoV-2 RNA is generally detectable in upper and lower  respiratory specimens during the acute phase of infection. The lowest  concentration of SARS-CoV-2 viral copies this assay can detect is 250  copies / mL. A negative result does not preclude SARS-CoV-2 infection  and should not be used as the sole basis for treatment or other  patient management decisions.  A negative result may occur with  improper specimen collection /  handling, submission of specimen other  than nasopharyngeal swab, presence of viral mutation(s) within the  areas targeted by this assay, and inadequate number of viral copies  (<250 copies / mL). A negative result must be combined with clinical  observations, patient history, and epidemiological information. If result is POSITIVE SARS-CoV-2 target nucleic acids are DETECTED. The SARS-CoV-2 RNA is generally detectable in upper and lower  respiratory specimens dur ing the acute phase of infection.  Positive  results are indicative of active infection with SARS-CoV-2.  Clinical  correlation with patient history and other diagnostic information is  necessary to determine patient infection status.  Positive results do  not rule out bacterial infection or co-infection with other viruses. If result is PRESUMPTIVE POSTIVE SARS-CoV-2 nucleic acids MAY BE PRESENT.   A presumptive positive result was obtained on the submitted specimen  and confirmed on repeat testing.  While 2019 novel coronavirus  (SARS-CoV-2) nucleic acids may be present in the submitted sample  additional confirmatory testing may be necessary for epidemiological  and / or clinical management purposes  to differentiate between  SARS-CoV-2 and other Sarbecovirus currently known to infect humans.  If clinically indicated additional testing with an alternate test  methodology [phone]) is advised. The SARS-CoV-2 RNA is generally  detectable in upper and lower respiratory sp ecimens during the acute  phase of infection. The expected result is Negative. Fact Sheet for Patients:  StrictlyIdeas.no Fact Sheet for Healthcare Providers: BankingDealers.co.za This test is not yet approved or cleared by the [place] FDA and has been authorized for detection and/or diagnosis of SARS-CoV-2 by FDA under an Emergency Use Authorization (EUA).  This EUA will remain in effect (meaning this  test can be used) for the duration of the COVID-19 declaration under Section 564(b)(1) of the Act, 21 U.S.C. section 360bbb-3(b)(1), unless the authorization is terminated or revoked sooner. Performed at [facility], [place] [address], [place], [place] [address]      Discharge Instructions:   Discharge Instructions    Diet - low sodium heart healthy   Complete by: As directed    Discharge instructions   Complete by: As directed    Family supervision Avoid mixing medications (pain medications and sleeping medications) Home health PT   Increase activity slowly   Complete by: As directed      Allergies as of 01/23/2019      Reactions   Keflex [cephalexin] Diarrhea   Sulfa Antibiotics Hives      Medication List    STOP taking these medications   AMBULATORY NON FORMULARY MEDICATION     TAKE these medications   acetaminophen 325 MG tablet Commonly known as: TYLENOL Take 650 mg by mouth every 6 (six) hours as needed for moderate pain.   buPROPion 150 MG 24 hr  tablet Commonly known as: WELLBUTRIN XL Take 150 mg by mouth daily.   citalopram 20 MG tablet Commonly known as: CELEXA Take 20 mg by mouth daily.   furosemide 40 MG tablet Commonly known as: LASIX Take 1 tablet (40 mg total) by mouth as needed for fluid or edema.   methimazole 5 MG tablet Commonly known as: TAPAZOLE Take 5 mg by mouth daily.   metoprolol succinate 100 MG 24 hr tablet Commonly known as: TOPROL-XL Take 1 tablet (100 mg total) by mouth daily. Take with or immediately following a meal.   multivitamin tablet Take 1 tablet by mouth daily.   nitroGLYCERIN 0.4 MG SL tablet Commonly known as: NITROSTAT Place 1 tablet (0.4 mg total) under the tongue every 5 (five) minutes as needed for chest pain.   nystatin powder Generic drug: nystatin Apply to your groin TID What changed:   how much to take  how to take this  when to take this  reasons to take this  additional instructions    potassium chloride SA 20 MEQ tablet Commonly known as: K-DUR Take 2 tablets (40 mEq total) by mouth as needed (when taking furosemid).   progesterone 200 MG capsule Commonly known as: PROMETRIUM Take 200 mg by mouth at bedtime.   warfarin 3 MG tablet Commonly known as: COUMADIN Take as directed. If you are unsure how to take this medication, talk to your nurse or doctor. Original instructions: Take 1 to 1.5 tablets by mouth daily as directed What changed:   how much to take  how to take this  when to take this  additional instructions      Follow-up Information    [name], MD Follow up in 1 week(s).   Specialty: Internal Medicine Why: to discuss sleeping medications Contact information: [place] [address] [phone]            Time coordinating discharge: 25 min  Signed:  [facility] 01/23/2019, 2:42 PM

## 2019-01-26 DIAGNOSIS — I6529 Occlusion and stenosis of unspecified carotid artery: Secondary | ICD-10-CM | POA: Diagnosis not present

## 2019-01-26 DIAGNOSIS — Z86711 Personal history of pulmonary embolism: Secondary | ICD-10-CM | POA: Diagnosis not present

## 2019-01-26 DIAGNOSIS — Z7901 Long term (current) use of anticoagulants: Secondary | ICD-10-CM | POA: Diagnosis not present

## 2019-01-26 DIAGNOSIS — I5043 Acute on chronic combined systolic (congestive) and diastolic (congestive) heart failure: Secondary | ICD-10-CM | POA: Diagnosis not present

## 2019-01-26 DIAGNOSIS — G479 Sleep disorder, unspecified: Secondary | ICD-10-CM | POA: Diagnosis not present

## 2019-01-26 DIAGNOSIS — I110 Hypertensive heart disease with heart failure: Secondary | ICD-10-CM | POA: Diagnosis not present

## 2019-01-26 DIAGNOSIS — D473 Essential (hemorrhagic) thrombocythemia: Secondary | ICD-10-CM | POA: Diagnosis not present

## 2019-01-26 DIAGNOSIS — G319 Degenerative disease of nervous system, unspecified: Secondary | ICD-10-CM | POA: Diagnosis not present

## 2019-01-26 DIAGNOSIS — Z86718 Personal history of other venous thrombosis and embolism: Secondary | ICD-10-CM | POA: Diagnosis not present

## 2019-01-26 DIAGNOSIS — F329 Major depressive disorder, single episode, unspecified: Secondary | ICD-10-CM | POA: Diagnosis not present

## 2019-01-26 DIAGNOSIS — E0590 Thyrotoxicosis, unspecified without thyrotoxic crisis or storm: Secondary | ICD-10-CM | POA: Diagnosis not present

## 2019-01-26 DIAGNOSIS — D62 Acute posthemorrhagic anemia: Secondary | ICD-10-CM | POA: Diagnosis not present

## 2019-01-26 DIAGNOSIS — Z6836 Body mass index (BMI) 36.0-36.9, adult: Secondary | ICD-10-CM | POA: Diagnosis not present

## 2019-01-26 DIAGNOSIS — Z9181 History of falling: Secondary | ICD-10-CM | POA: Diagnosis not present

## 2019-01-28 DIAGNOSIS — G479 Sleep disorder, unspecified: Secondary | ICD-10-CM | POA: Diagnosis not present

## 2019-01-28 DIAGNOSIS — I5043 Acute on chronic combined systolic (congestive) and diastolic (congestive) heart failure: Secondary | ICD-10-CM | POA: Diagnosis not present

## 2019-01-28 DIAGNOSIS — G319 Degenerative disease of nervous system, unspecified: Secondary | ICD-10-CM | POA: Diagnosis not present

## 2019-01-28 DIAGNOSIS — I110 Hypertensive heart disease with heart failure: Secondary | ICD-10-CM | POA: Diagnosis not present

## 2019-01-30 DIAGNOSIS — I110 Hypertensive heart disease with heart failure: Secondary | ICD-10-CM | POA: Diagnosis not present

## 2019-01-30 DIAGNOSIS — D62 Acute posthemorrhagic anemia: Secondary | ICD-10-CM | POA: Diagnosis not present

## 2019-01-30 DIAGNOSIS — G479 Sleep disorder, unspecified: Secondary | ICD-10-CM | POA: Diagnosis not present

## 2019-01-30 DIAGNOSIS — F329 Major depressive disorder, single episode, unspecified: Secondary | ICD-10-CM | POA: Diagnosis not present

## 2019-01-30 DIAGNOSIS — G319 Degenerative disease of nervous system, unspecified: Secondary | ICD-10-CM | POA: Diagnosis not present

## 2019-01-30 DIAGNOSIS — I5043 Acute on chronic combined systolic (congestive) and diastolic (congestive) heart failure: Secondary | ICD-10-CM | POA: Diagnosis not present

## 2019-02-01 ENCOUNTER — Other Ambulatory Visit: Payer: Self-pay

## 2019-02-01 ENCOUNTER — Ambulatory Visit (INDEPENDENT_AMBULATORY_CARE_PROVIDER_SITE_OTHER): Payer: Medicare Other | Admitting: Pharmacist Clinician (PhC)/ Clinical Pharmacy Specialist

## 2019-02-01 DIAGNOSIS — I2699 Other pulmonary embolism without acute cor pulmonale: Secondary | ICD-10-CM | POA: Diagnosis not present

## 2019-02-01 DIAGNOSIS — F329 Major depressive disorder, single episode, unspecified: Secondary | ICD-10-CM | POA: Diagnosis not present

## 2019-02-01 DIAGNOSIS — Z7901 Long term (current) use of anticoagulants: Secondary | ICD-10-CM

## 2019-02-01 DIAGNOSIS — G319 Degenerative disease of nervous system, unspecified: Secondary | ICD-10-CM | POA: Diagnosis not present

## 2019-02-01 DIAGNOSIS — D62 Acute posthemorrhagic anemia: Secondary | ICD-10-CM | POA: Diagnosis not present

## 2019-02-01 DIAGNOSIS — I5043 Acute on chronic combined systolic (congestive) and diastolic (congestive) heart failure: Secondary | ICD-10-CM | POA: Diagnosis not present

## 2019-02-01 DIAGNOSIS — I82403 Acute embolism and thrombosis of unspecified deep veins of lower extremity, bilateral: Secondary | ICD-10-CM | POA: Diagnosis not present

## 2019-02-01 DIAGNOSIS — G479 Sleep disorder, unspecified: Secondary | ICD-10-CM | POA: Diagnosis not present

## 2019-02-01 DIAGNOSIS — I110 Hypertensive heart disease with heart failure: Secondary | ICD-10-CM | POA: Diagnosis not present

## 2019-02-01 LAB — POCT INR: INR: 3.2 — AB (ref 2.0–3.0)

## 2019-02-04 DIAGNOSIS — G319 Degenerative disease of nervous system, unspecified: Secondary | ICD-10-CM | POA: Diagnosis not present

## 2019-02-04 DIAGNOSIS — F329 Major depressive disorder, single episode, unspecified: Secondary | ICD-10-CM | POA: Diagnosis not present

## 2019-02-04 DIAGNOSIS — I110 Hypertensive heart disease with heart failure: Secondary | ICD-10-CM | POA: Diagnosis not present

## 2019-02-04 DIAGNOSIS — G479 Sleep disorder, unspecified: Secondary | ICD-10-CM | POA: Diagnosis not present

## 2019-02-04 DIAGNOSIS — I5043 Acute on chronic combined systolic (congestive) and diastolic (congestive) heart failure: Secondary | ICD-10-CM | POA: Diagnosis not present

## 2019-02-04 DIAGNOSIS — D62 Acute posthemorrhagic anemia: Secondary | ICD-10-CM | POA: Diagnosis not present

## 2019-02-05 ENCOUNTER — Other Ambulatory Visit: Payer: Self-pay

## 2019-02-05 ENCOUNTER — Ambulatory Visit (INDEPENDENT_AMBULATORY_CARE_PROVIDER_SITE_OTHER): Payer: Medicare Other | Admitting: Internal Medicine

## 2019-02-05 ENCOUNTER — Encounter: Payer: Self-pay | Admitting: Internal Medicine

## 2019-02-05 VITALS — BP 138/92 | HR 106 | Temp 96.9°F | Ht 62.0 in | Wt 224.6 lb

## 2019-02-05 DIAGNOSIS — R410 Disorientation, unspecified: Secondary | ICD-10-CM

## 2019-02-05 DIAGNOSIS — I5022 Chronic systolic (congestive) heart failure: Secondary | ICD-10-CM | POA: Diagnosis not present

## 2019-02-05 DIAGNOSIS — G479 Sleep disorder, unspecified: Secondary | ICD-10-CM

## 2019-02-05 DIAGNOSIS — G4733 Obstructive sleep apnea (adult) (pediatric): Secondary | ICD-10-CM

## 2019-02-05 DIAGNOSIS — I2699 Other pulmonary embolism without acute cor pulmonale: Secondary | ICD-10-CM | POA: Diagnosis not present

## 2019-02-05 DIAGNOSIS — G4700 Insomnia, unspecified: Secondary | ICD-10-CM | POA: Diagnosis not present

## 2019-02-05 NOTE — Assessment & Plan Note (Signed)
She is most concerned about difficulty initiating and maintaining sleep, with frequent nocturia. Alos notes loud snoring, but sleeps alone. We need to deterrnine how much OSA has so we avoid suppressing respiration with sleep med.  Plan Split night sleep study, in context of chronic systolic and diastolic CHF.

## 2019-02-05 NOTE — Assessment & Plan Note (Signed)
She states she took a left over valium tab from her husband's supply, on top of her own sleep meds. Has no intention of doing this again- was trying to sleep.

## 2019-02-05 NOTE — Progress Notes (Signed)
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 Sonata 5 mg, Coumadin 3 mg, Celexa, Wellbutrin XL 150 mg Body weight today 224 lbs Epworth score 9 She defers flu vax till gets later with daughter. Has not noted if she has less nocturia on days she has taken lasix. Widowed but aware she snores loudly. No ENT surgery.  Sleep latency at least 1 hour w HS 11-12 MN.  ER visit this summer for acute delirium after taking a left over valium of her husband's on top of other meds, trying to get to sleep.  Ambien 10 mg worked well, but provider wanted her to get off it. Sonata 5 mg sometimes helps.  Admits shortness of breath without cough or wheeze. Most health problems related to cardiovascular disease and obesity as noted above. DOE walking with Physical Therapist.  Prior to Admission medications   Medication Sig Start Date End Date Taking? Authorizing Provider  acetaminophen (TYLENOL) 325 MG tablet Take 650 mg by mouth every 6 (six) hours as needed for moderate pain.   Yes [provider]  buPROPion (WELLBUTRIN XL) 150 MG 24 hr tablet Take 150 mg by mouth daily.   Yes [provider]  citalopram (CELEXA) 20 MG tablet Take 20 mg by mouth daily.   Yes Janie Morning, DO  furosemide (LASIX) 40 MG tablet Take 1 tablet (40 mg total) by mouth as needed for fluid or edema. 01/23/19  Yes Geradine Girt, DO  methimazole (TAPAZOLE) 5 MG tablet Take 5 mg by mouth daily.   Yes [provider]  Multiple Vitamin (MULTIVITAMIN) tablet Take 1 tablet by mouth daily.   Yes [provider]  nitroGLYCERIN (NITROSTAT) 0.4 MG SL tablet Place 1 tablet (0.4 mg total) under the tongue every 5  (five) minutes as needed for chest pain. 06/20/14  Yes [name], MD  nystatin (MYCOSTATIN/NYSTOP) 100000 UNIT/GM POWD Apply to your groin TID Patient taking differently: Apply 1 Bottle topically 3 (three) times daily as needed (groin).  07/09/15  Yes Thurnell Lose, MD  potassium chloride SA (K-DUR) 20 MEQ tablet Take 2 tablets (40 mEq total) by mouth as needed (when taking furosemid). 01/23/19  Yes Vann, Jessica U, DO  progesterone (PROMETRIUM) 200 MG capsule Take 200 mg by mouth at bedtime.    Yes [provider]  warfarin (COUMADIN) 3 MG tablet Take 1 to 1.5 tablets by mouth daily as directed Patient taking differently: Take 3-4.5 mg by mouth See admin instructions. Take 1 tab (3mg ) by mouth all days, EXCEPT on Monday take 1 1/2 (4.5mg ) 01/07/19  Yes [name], MD  zaleplon (SONATA) 5 MG capsule TAKE 1 TO 2 CAPSULES BY MOUTH AT BEDTIME 01/25/19  Yes [provider]  metoprolol succinate (TOPROL-XL) 100 MG 24 hr tablet Take 1 tablet (100 mg total) by mouth daily. Take with or immediately following a meal. 07/25/18 01/22/19  [name], MD   Past Medical History:  Diagnosis Date  . Arthritis    knees  . 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 07/2012 Echo: EF 55-60%, Gr1 DD;  b. 06/2015 Echo: EF 35-40%, triv AI, Mod MR, mod dil  LA, mildly reduced RV.  . DVT, bilateral lower limbs ([place])   . H/O cardiovascular stress test    a. 07/2012 MV: fixed mid-dist anterior and apical defect - scar vs attenuation, distal ant HK, no reversibility, EF 51%-->low risk.  Marland Kitchen Hypertensive heart disease   . Nocturia   . Normal coronary arteries    by cardiac catheterization 09/03/15  . Obese   . Postmenopausal vaginal bleeding   . Pulmonary embolism, bilateral ([facility])    a. chronic coumadin.  . Rash    under breasts  . Thyroid nodule    no meds   Past Surgical History:  Procedure Laterality Date  . CARDIAC CATHETERIZATION N/A  09/03/2015   Procedure: Left Heart Cath and Coronary Angiography;  Surgeon: [name], MD;  Location: [facility];  Service: Cardiovascular;  Laterality: N/A;  . CARDIOVASCULAR STRESS TEST  08-07-2012  DR HILTY/ DR BERRY   LOW RISK NUCLEAR STUDY/ NO ISCHEMIA/ FIXED MID TO DISTAL ANTERIOR AND APICAL DEFECT MAY REPRESENT SCAR VS BREAST ATTENUATION ARTIFACT/  MILD DISTAL ANTERIOR HYPOKINESIS/ EF 51%  . CESAREAN SECTION  1992  . COLONOSCOPY N/A 05/08/2015   Procedure: COLONOSCOPY;  Surgeon: Laurence Spates, MD;  Location: [facility] END[facility];  Service: Endoscopy;  Laterality: N/A;  . DILATION AND CURETTAGE OF UTERUS  06/20/2012   Procedure: DILATATION AND CURETTAGE;  Surgeon: [name], MD;  Location: [facility];  Service: Gynecology;  Laterality: N/A;  . dilation and evacution  1988   missed ab  . DILITATION & CURRETTAGE/HYSTROSCOPY WITH THERMACHOICE ABLATION N/A 09/04/2012   Procedure: DILATATION & CURETTAGE WITH THERMACHOICE ABLATION;  Surgeon: [name], MD;  Location: [facility];  Service: Gynecology;  Laterality: N/A;  . ESOPHAGOGASTRODUODENOSCOPY N/A 05/07/2015   Procedure: ESOPHAGOGASTRODUODENOSCOPY (EGD);  Surgeon: Laurence Spates, MD;  Location: [facility] ENDOSCOPY;  Service: Endoscopy;  Laterality: N/A;  . HYSTEROSCOPY W/D&C  03-24-2009  . TRANSTHORACIC ECHOCARDIOGRAM  08-08-2012   LVSF NORMAL/ EF [id number]  GRADE I DIASTOLIC DYSFUNCTION/ MILD AV AND MV REGURG./  RVSF MILDLY REDUCED   Family History  Problem Relation Age of Onset  . Coronary artery disease Father 51  . Stroke Father   . Hypertension Mother   . Mental illness Mother        anxiety  . Sudden death Sister    Social History   Socioeconomic History  . Marital status: Married    Spouse name: Not on file  . Number of children: Not on file  . Years of education: Not on file  . Highest education level: Not on file  Occupational History  . Not on file  Social Needs  . Financial resource strain:  Not on file  . Food insecurity    Worry: Not on file    Inability: Not on file  . Transportation needs    Medical: Not on file    Non-medical: Not on file  Tobacco Use  . Smoking status: Never Smoker  . Smokeless tobacco: Never Used  Substance and Sexual Activity  . Alcohol use: No  . Drug use: No  . Sexual activity: Not on file  Lifestyle  . Physical activity    Days per week: Not on file    Minutes per session: Not on file  . Stress: Not on file  Relationships  . Social Herbalist on phone: Not on file    Gets together: Not on file    Attends religious service: Not on file  Active member of club or organization: Not on file    Attends meetings of clubs or organizations: Not on file    Relationship status: Not on file  . Intimate partner violence    Fear of current or ex partner: Not on file    Emotionally abused: Not on file    Physically abused: Not on file    Forced sexual activity: Not on file  Other Topics Concern  . Not on file  Social History Narrative  . Not on file   ROS-see HPI   + = positive Constitutional:    weight loss, night sweats, fevers, chills, fatigue, lassitude. HEENT:    headaches, difficulty swallowing, tooth/dental problems, sore throat,       sneezing, itching, ear ache, nasal congestion, post nasal drip, snoring CV:    chest pain, orthopnea, PND, +swelling in lower extremities, anasarca,                                  dizziness, palpitations Resp:   +shortness of breath with exertion or at rest.                productive cough,   non-productive cough, coughing up of blood.              change in color of mucus.  wheezing.   Skin:    rash or lesions. GI:  No-   heartburn, indigestion, abdominal pain, nausea, vomiting, diarrhea,                 change in bowel habits, loss of appetite GU: dysuria, change in color of urine, no urgency or frequency.   flank pain. MS:   joint pain, stiffness, decreased range of motion, back  pain. Neuro-     nothing unusual Psych:  change in mood or affect.  +depression or anxiety.   memory loss.  OBJ- Physical Exam General- Alert, Oriented, Affect-appropriate, Distress- none acute, + morbidly obese Skin- rash-none, lesions- none, excoriation- none Lymphadenopathy- none Head- atraumatic            Eyes- Gross vision intact, PERRLA, conjunctivae and secretions clear            Ears- Hearing, canals-normal            Nose- Clear, no-Septal dev, mucus, polyps, erosion, perforation             Throat- Mallampati III , mucosa clear , drainage- none, tonsils- atrophic, + teeth Neck- flexible , trachea midline, no stridor , thyroid nl, carotid no bruit Chest - symmetrical excursion , unlabored           Heart/CV- RRR , no murmur , no gallop  , no rub, nl s1 s2                           - JVD- none , edema- none, stasis changes- none, varices- none           Lung- clear to P&A, wheeze- none, cough- none , dullness-none, rub- none           Chest wall-  Abd-  Br/ Gen/ Rectal- Not done, not indicated Extrem- cyanosis- none, clubbing, none, atrophy- none, strength- nl Neuro- grossly intact to observation

## 2019-02-05 NOTE — Assessment & Plan Note (Signed)
Chronic coumadin now.

## 2019-02-05 NOTE — Assessment & Plan Note (Signed)
Followed by cardiology. Nocturia is disturbing sleep. Not sure to what extent this is from volume overload vs incontinence.

## 2019-02-05 NOTE — Patient Instructions (Signed)
Order- schedule Split Protocol sleep study     Dx OSA with Insomnia  Please call as needed

## 2019-02-08 DIAGNOSIS — F329 Major depressive disorder, single episode, unspecified: Secondary | ICD-10-CM | POA: Diagnosis not present

## 2019-02-08 DIAGNOSIS — D62 Acute posthemorrhagic anemia: Secondary | ICD-10-CM | POA: Diagnosis not present

## 2019-02-08 DIAGNOSIS — G319 Degenerative disease of nervous system, unspecified: Secondary | ICD-10-CM | POA: Diagnosis not present

## 2019-02-08 DIAGNOSIS — I110 Hypertensive heart disease with heart failure: Secondary | ICD-10-CM | POA: Diagnosis not present

## 2019-02-08 DIAGNOSIS — I5043 Acute on chronic combined systolic (congestive) and diastolic (congestive) heart failure: Secondary | ICD-10-CM | POA: Diagnosis not present

## 2019-02-08 DIAGNOSIS — G479 Sleep disorder, unspecified: Secondary | ICD-10-CM | POA: Diagnosis not present

## 2019-02-09 ENCOUNTER — Other Ambulatory Visit (HOSPITAL_COMMUNITY)
Admission: RE | Admit: 2019-02-09 | Discharge: 2019-02-09 | Disposition: A | Discharge status: Home / Self Care | Payer: Medicare Other | Source: Ambulatory Visit | Attending: Internal Medicine | Admitting: Internal Medicine

## 2019-02-09 DIAGNOSIS — Z20828 Contact with and (suspected) exposure to other viral communicable diseases: Secondary | ICD-10-CM | POA: Diagnosis not present

## 2019-02-09 DIAGNOSIS — Z01812 Encounter for preprocedural laboratory examination: Secondary | ICD-10-CM | POA: Diagnosis not present

## 2019-02-10 LAB — NOVEL CORONAVIRUS, NAA (HOSP ORDER, SEND-OUT TO REF LAB; TAT 18-24 HRS): SARS-CoV-2, NAA: NOT DETECTED

## 2019-02-11 DIAGNOSIS — G319 Degenerative disease of nervous system, unspecified: Secondary | ICD-10-CM | POA: Diagnosis not present

## 2019-02-11 DIAGNOSIS — I110 Hypertensive heart disease with heart failure: Secondary | ICD-10-CM | POA: Diagnosis not present

## 2019-02-11 DIAGNOSIS — F329 Major depressive disorder, single episode, unspecified: Secondary | ICD-10-CM | POA: Diagnosis not present

## 2019-02-11 DIAGNOSIS — I5043 Acute on chronic combined systolic (congestive) and diastolic (congestive) heart failure: Secondary | ICD-10-CM | POA: Diagnosis not present

## 2019-02-11 DIAGNOSIS — D62 Acute posthemorrhagic anemia: Secondary | ICD-10-CM | POA: Diagnosis not present

## 2019-02-11 DIAGNOSIS — G479 Sleep disorder, unspecified: Secondary | ICD-10-CM | POA: Diagnosis not present

## 2019-02-13 ENCOUNTER — Other Ambulatory Visit: Payer: Self-pay

## 2019-02-13 ENCOUNTER — Ambulatory Visit (HOSPITAL_BASED_OUTPATIENT_CLINIC_OR_DEPARTMENT_OTHER): Payer: Medicare Other | Attending: Internal Medicine | Admitting: Internal Medicine

## 2019-02-13 VITALS — Temp 95.9°F | Ht 62.0 in | Wt 221.0 lb

## 2019-02-13 DIAGNOSIS — G4700 Insomnia, unspecified: Secondary | ICD-10-CM | POA: Insufficient documentation

## 2019-02-13 DIAGNOSIS — G4733 Obstructive sleep apnea (adult) (pediatric): Secondary | ICD-10-CM | POA: Diagnosis not present

## 2019-02-15 DIAGNOSIS — D62 Acute posthemorrhagic anemia: Secondary | ICD-10-CM | POA: Diagnosis not present

## 2019-02-15 DIAGNOSIS — I5043 Acute on chronic combined systolic (congestive) and diastolic (congestive) heart failure: Secondary | ICD-10-CM | POA: Diagnosis not present

## 2019-02-15 DIAGNOSIS — I110 Hypertensive heart disease with heart failure: Secondary | ICD-10-CM | POA: Diagnosis not present

## 2019-02-15 DIAGNOSIS — F329 Major depressive disorder, single episode, unspecified: Secondary | ICD-10-CM | POA: Diagnosis not present

## 2019-02-15 DIAGNOSIS — D649 Anemia, unspecified: Secondary | ICD-10-CM | POA: Diagnosis not present

## 2019-02-15 DIAGNOSIS — E0590 Thyrotoxicosis, unspecified without thyrotoxic crisis or storm: Secondary | ICD-10-CM | POA: Diagnosis not present

## 2019-02-15 DIAGNOSIS — G319 Degenerative disease of nervous system, unspecified: Secondary | ICD-10-CM | POA: Diagnosis not present

## 2019-02-15 DIAGNOSIS — G479 Sleep disorder, unspecified: Secondary | ICD-10-CM | POA: Diagnosis not present

## 2019-02-17 DIAGNOSIS — I5043 Acute on chronic combined systolic (congestive) and diastolic (congestive) heart failure: Secondary | ICD-10-CM | POA: Diagnosis not present

## 2019-02-17 DIAGNOSIS — G319 Degenerative disease of nervous system, unspecified: Secondary | ICD-10-CM | POA: Diagnosis not present

## 2019-02-17 DIAGNOSIS — F329 Major depressive disorder, single episode, unspecified: Secondary | ICD-10-CM | POA: Diagnosis not present

## 2019-02-17 DIAGNOSIS — G479 Sleep disorder, unspecified: Secondary | ICD-10-CM | POA: Diagnosis not present

## 2019-02-17 DIAGNOSIS — D62 Acute posthemorrhagic anemia: Secondary | ICD-10-CM | POA: Diagnosis not present

## 2019-02-17 DIAGNOSIS — I110 Hypertensive heart disease with heart failure: Secondary | ICD-10-CM | POA: Diagnosis not present

## 2019-02-19 DIAGNOSIS — I110 Hypertensive heart disease with heart failure: Secondary | ICD-10-CM | POA: Diagnosis not present

## 2019-02-19 DIAGNOSIS — G479 Sleep disorder, unspecified: Secondary | ICD-10-CM | POA: Diagnosis not present

## 2019-02-19 DIAGNOSIS — R7301 Impaired fasting glucose: Secondary | ICD-10-CM | POA: Diagnosis not present

## 2019-02-19 DIAGNOSIS — E0590 Thyrotoxicosis, unspecified without thyrotoxic crisis or storm: Secondary | ICD-10-CM | POA: Diagnosis not present

## 2019-02-19 DIAGNOSIS — D62 Acute posthemorrhagic anemia: Secondary | ICD-10-CM | POA: Diagnosis not present

## 2019-02-19 DIAGNOSIS — I5043 Acute on chronic combined systolic (congestive) and diastolic (congestive) heart failure: Secondary | ICD-10-CM | POA: Diagnosis not present

## 2019-02-19 DIAGNOSIS — G319 Degenerative disease of nervous system, unspecified: Secondary | ICD-10-CM | POA: Diagnosis not present

## 2019-02-19 DIAGNOSIS — F329 Major depressive disorder, single episode, unspecified: Secondary | ICD-10-CM | POA: Diagnosis not present

## 2019-02-19 DIAGNOSIS — I10 Essential (primary) hypertension: Secondary | ICD-10-CM | POA: Diagnosis not present

## 2019-02-19 DIAGNOSIS — E049 Nontoxic goiter, unspecified: Secondary | ICD-10-CM | POA: Diagnosis not present

## 2019-02-19 DIAGNOSIS — D649 Anemia, unspecified: Secondary | ICD-10-CM | POA: Diagnosis not present

## 2019-02-23 DIAGNOSIS — G4733 Obstructive sleep apnea (adult) (pediatric): Secondary | ICD-10-CM

## 2019-02-23 NOTE — Procedures (Signed)
Patient Name: Nicole Gibbs, S[place] Date: 02/13/2019 Gender: Female D.O.B: 06-Sep-1945 Age (years): 40 Referring Provider: Baird Lyons MD, ABSM Height (inches): 62 Interpreting Physician: Baird Lyons MD, ABSM Weight (lbs): 221 RPSGT: Jorge Ny BMI: 40 MRN: [id number] Neck Size: 15.00  CLINICAL INFORMATION Sleep Study Type: NPSG Indication for sleep study: Congestive Heart Failure, Hypertension, OSA, Snoring Epworth Sleepiness Score: 2  SLEEP STUDY TECHNIQUE As per the AASM Manual for the Scoring of Sleep and Associated Events v2.3 (April 2016) with a hypopnea requiring 4% desaturations.  The channels recorded and monitored were frontal, central and occipital EEG, electrooculogram (EOG), submentalis EMG (chin), nasal and oral airflow, thoracic and abdominal wall motion, anterior tibialis EMG, snore microphone, electrocardiogram, and pulse oximetry.  MEDICATIONS Medications self-administered by patient taken the night of the study : PROGESTERONE, Universal The study was initiated at 10:54:29 PM and ended at 5:19:54 AM.  Sleep onset time was 37.4 minutes and the sleep efficiency was 71.0%. The total sleep time was 273.5 minutes.  Stage REM latency was 261.0 minutes.  The patient spent 21.94% of the night in stage N1 sleep, 50.27% in stage N2 sleep, 17.37% in stage N3 and 10.4% in REM.  Alpha intrusion was absent.  Supine sleep was 0.00%.  RESPIRATORY PARAMETERS The overall apnea/hypopnea index (AHI) was 5.3 per hour. There were 3 total apneas, including 3 obstructive, 0 central and 0 mixed apneas. There were 21 hypopneas and 79 RERAs.  The AHI during Stage REM sleep was 10.5 per hour.  AHI while supine was N/A per hour.  The mean oxygen saturation was 90.83%. The minimum SpO2 during sleep was 83.00%.  moderate snoring was noted during this study.  CARDIAC DATA The 2 lead EKG demonstrated sinus rhythm. The mean heart rate was 63.12 beats  per minute. Other EKG findings include: PVCs.  LEG MOVEMENT DATA The total PLMS were 148 with a resulting PLMS index of 32.47. Associated arousal with leg movement index was 4.2 .  IMPRESSIONS - Mild obstructive sleep apnea occurred during this study (AHI = 5.3/h). - No significant central sleep apnea occurred during this study (CAI = 0.0/h). - Oxygen desaturation was noted during this study (Min O2 = 83.00%). Mean sat 91.3%. Time with saturation 88% or less was 21.9 minutes. - The patient snored with moderate snoring volume. - EKG findings include PVCs. 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 19 limb movements wwith arousal ( 4.2/ hr). 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  RECOMMENDATIONS - Treament for mild OSA is directed at symptoms. In this case, with additional diagnosis of Nocturnal Hypoxemia, consider CPAP, based on clinical judgment. - Be careful with alcohol, sedatives and other CNS depressants that may worsen sleep apnea and disrupt normal sleep architecture. - Sleep hygiene should be reviewed to assess factors that may improve sleep quality. - Weight management and regular exercise should be initiated or continued if appropriate.  [Electronically signed] 02/23/2019 11:58 AM  Baird Lyons MD, ABSM Diplomate, American Board of Sleep Medicine   NPI: [id number]                         [place] [place] [facility]  ELECTRONICALLY SIGNED ON:  02/23/2019, 11:52 AM [place] PH: [phone]   FX: [phone] [place]  SLEEP MEDICINE

## 2019-02-25 DIAGNOSIS — Z86711 Personal history of pulmonary embolism: Secondary | ICD-10-CM | POA: Diagnosis not present

## 2019-02-25 DIAGNOSIS — Z86718 Personal history of other venous thrombosis and embolism: Secondary | ICD-10-CM | POA: Diagnosis not present

## 2019-02-25 DIAGNOSIS — I6529 Occlusion and stenosis of unspecified carotid artery: Secondary | ICD-10-CM | POA: Diagnosis not present

## 2019-02-25 DIAGNOSIS — G479 Sleep disorder, unspecified: Secondary | ICD-10-CM | POA: Diagnosis not present

## 2019-02-25 DIAGNOSIS — G319 Degenerative disease of nervous system, unspecified: Secondary | ICD-10-CM | POA: Diagnosis not present

## 2019-02-25 DIAGNOSIS — I5043 Acute on chronic combined systolic (congestive) and diastolic (congestive) heart failure: Secondary | ICD-10-CM | POA: Diagnosis not present

## 2019-02-25 DIAGNOSIS — Z6836 Body mass index (BMI) 36.0-36.9, adult: Secondary | ICD-10-CM | POA: Diagnosis not present

## 2019-02-25 DIAGNOSIS — Z7901 Long term (current) use of anticoagulants: Secondary | ICD-10-CM | POA: Diagnosis not present

## 2019-02-25 DIAGNOSIS — D473 Essential (hemorrhagic) thrombocythemia: Secondary | ICD-10-CM | POA: Diagnosis not present

## 2019-02-25 DIAGNOSIS — Z9181 History of falling: Secondary | ICD-10-CM | POA: Diagnosis not present

## 2019-02-25 DIAGNOSIS — F329 Major depressive disorder, single episode, unspecified: Secondary | ICD-10-CM | POA: Diagnosis not present

## 2019-02-25 DIAGNOSIS — I110 Hypertensive heart disease with heart failure: Secondary | ICD-10-CM | POA: Diagnosis not present

## 2019-02-25 DIAGNOSIS — D62 Acute posthemorrhagic anemia: Secondary | ICD-10-CM | POA: Diagnosis not present

## 2019-02-25 DIAGNOSIS — E0590 Thyrotoxicosis, unspecified without thyrotoxic crisis or storm: Secondary | ICD-10-CM | POA: Diagnosis not present

## 2019-02-27 DIAGNOSIS — I5043 Acute on chronic combined systolic (congestive) and diastolic (congestive) heart failure: Secondary | ICD-10-CM | POA: Diagnosis not present

## 2019-02-27 DIAGNOSIS — D62 Acute posthemorrhagic anemia: Secondary | ICD-10-CM | POA: Diagnosis not present

## 2019-02-27 DIAGNOSIS — I110 Hypertensive heart disease with heart failure: Secondary | ICD-10-CM | POA: Diagnosis not present

## 2019-02-27 DIAGNOSIS — F329 Major depressive disorder, single episode, unspecified: Secondary | ICD-10-CM | POA: Diagnosis not present

## 2019-02-27 DIAGNOSIS — G319 Degenerative disease of nervous system, unspecified: Secondary | ICD-10-CM | POA: Diagnosis not present

## 2019-02-27 DIAGNOSIS — G479 Sleep disorder, unspecified: Secondary | ICD-10-CM | POA: Diagnosis not present

## 2019-02-28 ENCOUNTER — Telehealth: Payer: Self-pay

## 2019-02-28 NOTE — Telephone Encounter (Signed)
lmom for overdue inr 

## 2019-03-04 DIAGNOSIS — Z124 Encounter for screening for malignant neoplasm of cervix: Secondary | ICD-10-CM | POA: Diagnosis not present

## 2019-03-04 DIAGNOSIS — Z191 Hormone sensitive malignancy status: Secondary | ICD-10-CM | POA: Diagnosis not present

## 2019-03-04 DIAGNOSIS — Z6841 Body Mass Index (BMI) 40.0 and over, adult: Secondary | ICD-10-CM | POA: Diagnosis not present

## 2019-03-07 DIAGNOSIS — F329 Major depressive disorder, single episode, unspecified: Secondary | ICD-10-CM | POA: Diagnosis not present

## 2019-03-07 DIAGNOSIS — I5043 Acute on chronic combined systolic (congestive) and diastolic (congestive) heart failure: Secondary | ICD-10-CM | POA: Diagnosis not present

## 2019-03-07 DIAGNOSIS — I110 Hypertensive heart disease with heart failure: Secondary | ICD-10-CM | POA: Diagnosis not present

## 2019-03-07 DIAGNOSIS — D62 Acute posthemorrhagic anemia: Secondary | ICD-10-CM | POA: Diagnosis not present

## 2019-03-07 DIAGNOSIS — G479 Sleep disorder, unspecified: Secondary | ICD-10-CM | POA: Diagnosis not present

## 2019-03-07 DIAGNOSIS — G319 Degenerative disease of nervous system, unspecified: Secondary | ICD-10-CM | POA: Diagnosis not present

## 2019-03-11 DIAGNOSIS — N958 Other specified menopausal and perimenopausal disorders: Secondary | ICD-10-CM | POA: Diagnosis not present

## 2019-03-11 DIAGNOSIS — M8588 Other specified disorders of bone density and structure, other site: Secondary | ICD-10-CM | POA: Diagnosis not present

## 2019-03-11 DIAGNOSIS — Z1231 Encounter for screening mammogram for malignant neoplasm of breast: Secondary | ICD-10-CM | POA: Diagnosis not present

## 2019-03-12 DIAGNOSIS — Z23 Encounter for immunization: Secondary | ICD-10-CM | POA: Diagnosis not present

## 2019-03-15 ENCOUNTER — Other Ambulatory Visit: Payer: Self-pay

## 2019-03-15 ENCOUNTER — Ambulatory Visit (INDEPENDENT_AMBULATORY_CARE_PROVIDER_SITE_OTHER): Payer: Medicare Other | Admitting: Pharmacist Clinician (PhC)/ Clinical Pharmacy Specialist

## 2019-03-15 DIAGNOSIS — I824Y3 Acute embolism and thrombosis of unspecified deep veins of proximal lower extremity, bilateral: Secondary | ICD-10-CM | POA: Diagnosis not present

## 2019-03-15 DIAGNOSIS — I2699 Other pulmonary embolism without acute cor pulmonale: Secondary | ICD-10-CM | POA: Diagnosis not present

## 2019-03-15 DIAGNOSIS — I82403 Acute embolism and thrombosis of unspecified deep veins of lower extremity, bilateral: Secondary | ICD-10-CM | POA: Diagnosis not present

## 2019-03-15 DIAGNOSIS — Z7901 Long term (current) use of anticoagulants: Secondary | ICD-10-CM | POA: Diagnosis not present

## 2019-03-15 LAB — POCT INR: INR: 1.7 — AB (ref 2.0–3.0)

## 2019-03-21 ENCOUNTER — Other Ambulatory Visit: Payer: Self-pay | Admitting: *Deleted

## 2019-03-21 DIAGNOSIS — G4733 Obstructive sleep apnea (adult) (pediatric): Secondary | ICD-10-CM

## 2019-04-01 ENCOUNTER — Inpatient Hospital Stay (HOSPITAL_COMMUNITY): Admission: RE | Admit: 2019-04-01 | Payer: Medicare Other | Source: Ambulatory Visit

## 2019-04-03 ENCOUNTER — Encounter (HOSPITAL_BASED_OUTPATIENT_CLINIC_OR_DEPARTMENT_OTHER): Payer: Medicare Other | Admitting: Internal Medicine

## 2019-04-04 DIAGNOSIS — D2262 Melanocytic nevi of left upper limb, including shoulder: Secondary | ICD-10-CM | POA: Diagnosis not present

## 2019-04-04 DIAGNOSIS — L218 Other seborrheic dermatitis: Secondary | ICD-10-CM | POA: Diagnosis not present

## 2019-04-04 DIAGNOSIS — L738 Other specified follicular disorders: Secondary | ICD-10-CM | POA: Diagnosis not present

## 2019-04-04 DIAGNOSIS — L821 Other seborrheic keratosis: Secondary | ICD-10-CM | POA: Diagnosis not present

## 2019-04-04 DIAGNOSIS — D225 Melanocytic nevi of trunk: Secondary | ICD-10-CM | POA: Diagnosis not present

## 2019-04-04 DIAGNOSIS — L304 Erythema intertrigo: Secondary | ICD-10-CM | POA: Diagnosis not present

## 2019-04-05 ENCOUNTER — Other Ambulatory Visit: Payer: Self-pay

## 2019-04-05 ENCOUNTER — Ambulatory Visit (INDEPENDENT_AMBULATORY_CARE_PROVIDER_SITE_OTHER): Payer: Medicare Other | Admitting: Pharmacist Clinician (PhC)/ Clinical Pharmacy Specialist

## 2019-04-05 DIAGNOSIS — Z7901 Long term (current) use of anticoagulants: Secondary | ICD-10-CM

## 2019-04-05 DIAGNOSIS — I2699 Other pulmonary embolism without acute cor pulmonale: Secondary | ICD-10-CM | POA: Diagnosis not present

## 2019-04-05 DIAGNOSIS — I82403 Acute embolism and thrombosis of unspecified deep veins of lower extremity, bilateral: Secondary | ICD-10-CM | POA: Diagnosis not present

## 2019-04-05 LAB — POCT INR: INR: 2 (ref 2.0–3.0)

## 2019-04-17 ENCOUNTER — Other Ambulatory Visit (HOSPITAL_COMMUNITY): Payer: Medicare Other

## 2019-04-18 ENCOUNTER — Other Ambulatory Visit: Payer: Self-pay

## 2019-04-18 ENCOUNTER — Ambulatory Visit (INDEPENDENT_AMBULATORY_CARE_PROVIDER_SITE_OTHER): Payer: Medicare Other | Admitting: Internal Medicine

## 2019-04-18 ENCOUNTER — Encounter: Payer: Self-pay | Admitting: Internal Medicine

## 2019-04-18 VITALS — BP 130/76 | HR 63 | Temp 97.3°F | Ht 62.0 in | Wt 230.0 lb

## 2019-04-18 DIAGNOSIS — G4733 Obstructive sleep apnea (adult) (pediatric): Secondary | ICD-10-CM | POA: Diagnosis not present

## 2019-04-18 DIAGNOSIS — F5101 Primary insomnia: Secondary | ICD-10-CM

## 2019-04-18 MED ORDER — ZALEPLON 5 MG PO CAPS: ORAL_CAPSULE | ORAL | 5 refills | Status: DC

## 2019-04-18 NOTE — Patient Instructions (Signed)
Order- re-order CPAP titration sleep study   Dx OSA, Nocturnal Hypoxemia  Script sent refilling Sonata  Please call about 2 weeks after your sleep study to see if we have results and recommendations yet. We are looking to see if CPAP will correct your low oxygen levels during sleep. Once this is settled, we can start thinking if a different sleep medicine might work better.

## 2019-04-18 NOTE — Progress Notes (Signed)
HPI  F never smoker followed for OSA, Insomnia, complicated by Chronic S&D CHF, Hx PE/ DVT, HBP,  GI bleed, Obesity, DOE  NPSG 02/13/2019-  AHI 5.3/ hr, desat to 83% w 22 minutes of Sat< /= 88%. 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 Sonata 5 mg, Coumadin 3 mg, Celexa, Wellbutrin XL 150 mg Body weight today 224 lbs Epworth score 9 She defers flu vax till gets later with daughter. Has not noted if she has less nocturia on days she has taken lasix. Widowed but aware she snores loudly. No ENT surgery.  Sleep latency at least 1 hour w HS 11-12 MN.  ER visit this summer for acute delirium after taking a left over valium of her husband's on top of other meds, trying to get to sleep.  Ambien 10 mg worked well, but provider wanted her to get off it. Sonata 5 mg sometimes helps.  Admits shortness of breath without cough or wheeze. Most health problems related to cardiovascular disease and obesity as noted above. DOE walking with Physical Therapist.  04/18/2019- 81 YO F never smoker followed for OSA, Insomnia, complicated by Chronic S&D CHF, Hx PE/ DVT, HBP,  GI bleed, Obesity, DOE  NPSG 02/13/2019-  AHI 5.3/ hr, desat to 83% w 22 minutes of Sat< /= 88%. PLMI 4.2/ hr, body weight  221 lbs Because of her CHF hx and Nocturnal Hypoxemia we had suggested CPAP titration for O2 documentation. She cancelled CPAP titration, citing Covid exposure concerns- discussed.  Wakes middle of night for bathroom and has trouble regaining sleep. Takes Sonata at [facility] and repeats on WASO "big help, but not  enough".  ROS-see HPI   + = positive Constitutional:    weight loss, night sweats, fevers, chills, fatigue, lassitude. HEENT:    headaches, difficulty swallowing, tooth/dental problems, sore throat,       sneezing, itching, ear ache, nasal congestion, post nasal drip, snoring CV:    chest pain, orthopnea, PND, +swelling in lower extremities, anasarca,                                  dizziness, palpitations Resp:   +shortness of breath with exertion or at rest.                productive cough,   non-productive cough, coughing up of blood.              change in color of mucus.  wheezing.   Skin:    rash or lesions. GI:  No-   heartburn, indigestion, abdominal pain, nausea, vomiting, diarrhea,                 change in bowel habits, loss of appetite GU: dysuria, change in color of urine, no urgency or frequency.   flank pain. MS:   joint pain, stiffness, decreased range of motion, back pain. Neuro-     nothing unusual Psych:  change in mood or affect.  +depression or anxiety.   memory loss.  OBJ- Physical Exam General- Alert, Oriented, Affect-appropriate, Distress- none acute, + morbidly  obese Skin- rash-none, lesions- none, excoriation- none Lymphadenopathy- none Head- atraumatic            Eyes- Gross vision intact, PERRLA, conjunctivae and secretions clear            Ears- Hearing, canals-normal            Nose- Clear, no-Septal dev, mucus, polyps, erosion, perforation             Throat- Mallampati III , mucosa clear , drainage- none, tonsils- atrophic, + teeth Neck- flexible , trachea midline, no stridor , thyroid nl, carotid no bruit Chest - symmetrical excursion , unlabored           Heart/CV- RRR , no murmur , no gallop  , no rub, nl s1 s2                           - JVD +1 cm , edema- none, stasis changes- none, varices- none           Lung- clear to P&A, wheeze- none, cough- none , dullness-none, rub- none           Chest wall-  Abd-  Br/ Gen/ Rectal- Not done, not  indicated Extrem- cyanosis- none, clubbing, none, atrophy- none, strength- nl Neuro- grossly intact to observation

## 2019-04-20 ENCOUNTER — Encounter (HOSPITAL_BASED_OUTPATIENT_CLINIC_OR_DEPARTMENT_OTHER): Payer: Medicare Other | Admitting: Internal Medicine

## 2019-05-03 ENCOUNTER — Other Ambulatory Visit: Payer: Self-pay

## 2019-05-03 ENCOUNTER — Ambulatory Visit (INDEPENDENT_AMBULATORY_CARE_PROVIDER_SITE_OTHER): Payer: Medicare Other | Admitting: Pharmacist Clinician (PhC)/ Clinical Pharmacy Specialist

## 2019-05-03 DIAGNOSIS — I82403 Acute embolism and thrombosis of unspecified deep veins of lower extremity, bilateral: Secondary | ICD-10-CM

## 2019-05-03 DIAGNOSIS — I2699 Other pulmonary embolism without acute cor pulmonale: Secondary | ICD-10-CM | POA: Diagnosis not present

## 2019-05-03 DIAGNOSIS — Z7901 Long term (current) use of anticoagulants: Secondary | ICD-10-CM

## 2019-05-03 LAB — POCT INR: INR: 1.8 — AB (ref 2.0–3.0)

## 2019-05-03 MED ORDER — METOPROLOL SUCCINATE ER 100 MG PO TB24: 100.0000 mg | ORAL_TABLET | Freq: Every day | ORAL | 0 refills | Status: DC

## 2019-05-05 NOTE — Assessment & Plan Note (Signed)
Minimal OSA but with nocturnal hypoxemia, I suggested she go ahead with CPAP titration to address OSA and O2 sat. Encourage weight loss. Driving safety.

## 2019-05-05 NOTE — Assessment & Plan Note (Signed)
Waking after sleep onset- middle of night. Sonata helps but she wants more and I declined until we understand her sleep breathing issues better.   Addressed sleep hygiene.

## 2019-05-15 ENCOUNTER — Other Ambulatory Visit: Payer: Self-pay

## 2019-05-15 ENCOUNTER — Ambulatory Visit (INDEPENDENT_AMBULATORY_CARE_PROVIDER_SITE_OTHER): Payer: Medicare Other | Admitting: Cardiovascular Disease

## 2019-05-15 ENCOUNTER — Encounter: Payer: Self-pay | Admitting: Cardiovascular Disease

## 2019-05-15 VITALS — BP 144/86 | HR 71 | Temp 97.0°F | Ht 62.0 in | Wt 236.0 lb

## 2019-05-15 DIAGNOSIS — I2699 Other pulmonary embolism without acute cor pulmonale: Secondary | ICD-10-CM

## 2019-05-15 DIAGNOSIS — I824Y3 Acute embolism and thrombosis of unspecified deep veins of proximal lower extremity, bilateral: Secondary | ICD-10-CM

## 2019-05-15 DIAGNOSIS — I10 Essential (primary) hypertension: Secondary | ICD-10-CM | POA: Diagnosis not present

## 2019-05-15 DIAGNOSIS — I519 Heart disease, unspecified: Secondary | ICD-10-CM | POA: Diagnosis not present

## 2019-05-15 DIAGNOSIS — I5042 Chronic combined systolic (congestive) and diastolic (congestive) heart failure: Secondary | ICD-10-CM

## 2019-05-15 MED ORDER — LISINOPRIL 5 MG PO TABS: 5.0000 mg | ORAL_TABLET | Freq: Every day | ORAL | 3 refills | Status: DC

## 2019-05-15 NOTE — Patient Instructions (Signed)
Medication Instructions:  Start taking 5mg  Lisinopril Daily.  If you need a refill on your cardiac medications before your next appointment, please call your pharmacy.   Lab work: BMET in 2 weeks If you have labs (blood work) drawn today and your tests are completely normal, you will receive your results only by: [place] (if you have MyChart) OR A paper copy in the mail If you have any lab test that is abnormal or we need to change your treatment, we will call you to review the results.  Testing/Procedures: Your physician has requested that you have an echocardiogram. Echocardiography is a painless test that uses sound waves to create images of your heart. It provides your doctor with information about the size and shape of your heart and how well your heart's chambers and valves are working. This procedure takes approximately one hour. There are no restrictions for this procedure. Day 300  Follow-Up: At [facility], you and your health needs are our priority.  As part of our continuing mission to provide you with exceptional heart care, we have created designated Provider Care Teams.  These Care Teams include your primary Cardiologist (physician) and Advanced Practice Providers (APPs -  Physician Assistants and Nurse Practitioners) who all work together to provide you with the care you need, when you need it. You may see Dr [name] or one of the following Advanced Practice Providers on your designated Care Team:    Kerin Ransom, PA-C  [place], [place]  Coletta Memos, [place] Your physician wants you to follow-up in: 6 months with a Physicians Assistant  Your physician wants you to follow-up in: 1 year with Dr [name]

## 2019-05-15 NOTE — Assessment & Plan Note (Addendum)
History of nonischemic cardiomyopathy with an EF in the 35 to 40% range and normal coronary arteries by cardiac catheterization 09/03/2015.  She is on metoprolol and has been on lisinopril in the past which has been discontinued for unclear reasons.  I am going to reinitiate lisinopril.  I am going to recheck LV function by 2D echocardiography.

## 2019-05-15 NOTE — Assessment & Plan Note (Signed)
Being evaluated by Dr. Annamaria Boots

## 2019-05-15 NOTE — Assessment & Plan Note (Signed)
History of dyspnea on exertion probably multifactorial.  She has gained 40 pounds since I saw her 3 years ago.  She is also had multiple pulmonary emboli in the past and has an EF of 35% when last checked.

## 2019-05-15 NOTE — Assessment & Plan Note (Signed)
History of DVT and pulmonary emboli in the past on warfarin anticoagulation.

## 2019-05-15 NOTE — Progress Notes (Signed)
05/15/2019 Nicole Gibbs   Aug 30, 1945  [id number]  Primary Physician [name], MD Primary Cardiologist: [name] MD [name], [place]  HPI:  Nicole Gibbs is a 73 y.o.  severely overweight, married Caucasian female, mother of one child, who I last saw him in the office 10/02/2015.  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 She had normal LV systolic function and mild RV dysfunction. Ultimately, because of vaginal bleeding, she underwent a D&C with endometrial ablation by Dr. Helane Rima April 22, [id number], without complication. She is no longer bleeding, and her shortness of breath has improved. Her Coumadin is followed here in our office. A followup CT angiogram revealed complete resolution of her pulmonary emboli but she did have an incidentally noted multinodular goiter. Follow-up lower extremity venous Doppler study showed resolution of her DVT. She does complain of progressive dyspnea which is somewhat limiting. She has been admitted to [facility] in December and again in February with anemia and heart failure. A recent Myoview stress test showed anteroapical scar and a 2-D echo revealed an ejection fraction of 35-40% which represents this is a significant decline compared to her EF 3 years ago which was normal.. She has had a negative upper and lower endoscopy with plans to perform pill endoscopy as well. Her hemoglobin has recently remained stable. I performed outpatient cardiac catheterization on her 09/03/15 revealing normal coronary arteries with ejection fraction 35-445% consistent with a nonischemic cardiomyopathy.  Since I saw her last she is remained stable.  She has been worked up for sleep apnea by Dr. Annamaria Boots.  She has gained 40 pounds since I saw her.  She walks with a cane does complain of dyspnea on exertion which is  probably multifactorial.  She denies chest pain.   No outpatient medications have been marked as taking for the 05/15/19 encounter (Office Visit) with [name], MD.     Allergies  Allergen Reactions  . Keflex [Cephalexin] Diarrhea  . Sulfa Antibiotics Hives    Social History   Socioeconomic History  . Marital status: Married    Spouse name: Not on file  . Number of children: Not on file  . Years of education: Not on file  . Highest education level: Not on file  Occupational History  . Not on file  Tobacco Use  . Smoking status: Never Smoker  . Smokeless tobacco: Never Used  Substance and Sexual Activity  . Alcohol use: No  . Drug use: No  . Sexual activity: Not on file  Other Topics Concern  . Not on file  Social History Narrative  . Not on file   Social Determinants of Health   Financial Resource Strain:   . Difficulty of Paying Living Expenses: Not on file  Food Insecurity:   . Worried About Charity fundraiser in the Last Year: Not on file  . Ran Out of Food in the Last Year: Not on file  Transportation Needs:   . Lack of Transportation (Medical): Not on file  . Lack of Transportation (Non-Medical): Not on file  Physical Activity:   . Days of Exercise per Week: Not on file  . Minutes of Exercise per Session: Not on file  Stress:   . Feeling of Stress : Not on file  Social Connections:   . Frequency of Communication  with Friends and Family: Not on file  . Frequency of Social Gatherings with Friends and Family: Not on file  . Attends Religious Services: Not on file  . Active Member of Clubs or Organizations: Not on file  . Attends Archivist Meetings: Not on file  . Marital Status: Not on file  Intimate Partner Violence:   . Fear of Current or Ex-Partner: Not on file  . Emotionally Abused: Not on file  . Physically Abused: Not on file  . Sexually Abused: Not on file     Review of Systems: General: negative for chills, fever, night  sweats or weight changes.  Cardiovascular: negative for chest pain, dyspnea on exertion, edema, orthopnea, palpitations, paroxysmal nocturnal dyspnea or shortness of breath Dermatological: negative for rash Respiratory: negative for cough or wheezing Urologic: negative for hematuria Abdominal: negative for nausea, vomiting, diarrhea, bright red blood per rectum, melena, or hematemesis Neurologic: negative for visual changes, syncope, or dizziness All other systems reviewed and are otherwise negative except as noted above.    Blood pressure (!) 144/86, pulse 71, temperature (!) 97 F (36.1 C), height 5\' 2"  (1.575 m), weight 236 lb (107 kg).  General appearance: alert and no distress Neck: no adenopathy, no carotid bruit, no JVD, supple, symmetrical, trachea midline and thyroid not enlarged, symmetric, no tenderness/mass/nodules Lungs: clear to auscultation bilaterally Heart: regular rate and rhythm, S1, S2 normal, no murmur, click, rub or gallop Extremities: 1-2+ pitting edema bilaterally Pulses: 2+ and symmetric Skin: Skin color, texture, turgor normal. No rashes or lesions Neurologic: Alert and oriented X 3, normal strength and tone. Normal symmetric reflexes. Normal coordination and gait  EKG not performed today  ASSESSMENT AND PLAN:   HTN (hypertension) History of essential hypertension blood pressure measured today 144/86.  She is on metoprolol and has been on lisinopril in the past.  Pulmonary embolism, Bilateral History of DVT and pulmonary emboli in the past on warfarin anticoagulation.  Dyspnea on exertion History of dyspnea on exertion probably multifactorial.  She has gained 40 pounds since I saw her 3 years ago.  She is also had multiple pulmonary emboli in the past and has an EF of 35% when last checked.  Chronic combined systolic and diastolic CHF (congestive heart failure) (HCC) History of nonischemic cardiomyopathy with an EF in the 35 to 40% range and normal  coronary arteries by cardiac catheterization 09/03/2015.  She is on metoprolol and has been on lisinopril in the past which has been discontinued for unclear reasons.  I am going to reinitiate lisinopril.  I am going to recheck LV function by 2D echocardiography.  OSA (obstructive sleep apnea) Being evaluated by Dr. Molli Barrows MD [facility], [facility] 05/15/2019 2:47 PM

## 2019-05-15 NOTE — Assessment & Plan Note (Signed)
History of essential hypertension blood pressure measured today 144/86.  She is on metoprolol and has been on lisinopril in the past.

## 2019-05-21 DIAGNOSIS — D649 Anemia, unspecified: Secondary | ICD-10-CM | POA: Diagnosis not present

## 2019-05-21 DIAGNOSIS — R7301 Impaired fasting glucose: Secondary | ICD-10-CM | POA: Diagnosis not present

## 2019-05-21 DIAGNOSIS — E0590 Thyrotoxicosis, unspecified without thyrotoxic crisis or storm: Secondary | ICD-10-CM | POA: Diagnosis not present

## 2019-05-21 DIAGNOSIS — I10 Essential (primary) hypertension: Secondary | ICD-10-CM | POA: Diagnosis not present

## 2019-05-21 DIAGNOSIS — E049 Nontoxic goiter, unspecified: Secondary | ICD-10-CM | POA: Diagnosis not present

## 2019-05-21 LAB — BASIC METABOLIC PANEL
BUN: 15 (ref 4–21)
CO2: 30 — AB (ref 13–22)
Chloride: 104 (ref 99–108)
Creatinine: 0.8 (ref <=1.1)
Glucose: 85
Potassium: 4.4 (ref 3.4–5.3)
Sodium: 141 (ref 137–147)

## 2019-05-21 LAB — CBC AND DIFFERENTIAL
HCT: 50 — AB (ref 36–46)
Hemoglobin: 15.8 (ref 12.0–16.0)
Platelets: 332 (ref 150–399)
WBC: 7.1

## 2019-05-21 LAB — COMPREHENSIVE METABOLIC PANEL: Calcium: 9 (ref 8.7–10.7)

## 2019-05-31 ENCOUNTER — Encounter (INDEPENDENT_AMBULATORY_CARE_PROVIDER_SITE_OTHER): Payer: Self-pay

## 2019-05-31 ENCOUNTER — Other Ambulatory Visit: Payer: Self-pay

## 2019-05-31 ENCOUNTER — Encounter: Payer: Self-pay | Admitting: Pharmacist

## 2019-05-31 ENCOUNTER — Ambulatory Visit (INDEPENDENT_AMBULATORY_CARE_PROVIDER_SITE_OTHER): Payer: Medicare Other | Admitting: Pharmacist

## 2019-05-31 DIAGNOSIS — I82403 Acute embolism and thrombosis of unspecified deep veins of lower extremity, bilateral: Secondary | ICD-10-CM

## 2019-05-31 DIAGNOSIS — Z7901 Long term (current) use of anticoagulants: Secondary | ICD-10-CM | POA: Diagnosis not present

## 2019-05-31 DIAGNOSIS — I2699 Other pulmonary embolism without acute cor pulmonale: Secondary | ICD-10-CM

## 2019-05-31 LAB — POCT INR: INR: 2.1 (ref 2.0–3.0)

## 2019-05-31 NOTE — Patient Instructions (Addendum)
Saline gel (AYR) or saline solution - over the counter OKAY to use cotton soak in Afrin to stop nosebleed if needed.

## 2019-05-31 NOTE — Progress Notes (Signed)
This encounter was created in error - please disregard.

## 2019-06-03 ENCOUNTER — Other Ambulatory Visit (HOSPITAL_COMMUNITY): Payer: Medicare Other

## 2019-06-05 ENCOUNTER — Encounter (HOSPITAL_BASED_OUTPATIENT_CLINIC_OR_DEPARTMENT_OTHER): Payer: Medicare Other | Admitting: Internal Medicine

## 2019-06-10 ENCOUNTER — Other Ambulatory Visit: Payer: Self-pay

## 2019-06-10 ENCOUNTER — Ambulatory Visit (HOSPITAL_COMMUNITY): Payer: Medicare Other | Attending: Cardiology

## 2019-06-10 DIAGNOSIS — I519 Heart disease, unspecified: Secondary | ICD-10-CM | POA: Insufficient documentation

## 2019-06-10 DIAGNOSIS — I10 Essential (primary) hypertension: Secondary | ICD-10-CM | POA: Diagnosis not present

## 2019-06-10 DIAGNOSIS — I5042 Chronic combined systolic (congestive) and diastolic (congestive) heart failure: Secondary | ICD-10-CM | POA: Diagnosis not present

## 2019-07-08 ENCOUNTER — Other Ambulatory Visit: Payer: Self-pay

## 2019-07-08 ENCOUNTER — Ambulatory Visit (INDEPENDENT_AMBULATORY_CARE_PROVIDER_SITE_OTHER): Payer: Medicare Other | Admitting: Pharmacist Clinician (PhC)/ Clinical Pharmacy Specialist

## 2019-07-08 DIAGNOSIS — I82403 Acute embolism and thrombosis of unspecified deep veins of lower extremity, bilateral: Secondary | ICD-10-CM | POA: Diagnosis not present

## 2019-07-08 DIAGNOSIS — I82401 Acute embolism and thrombosis of unspecified deep veins of right lower extremity: Secondary | ICD-10-CM | POA: Diagnosis not present

## 2019-07-08 DIAGNOSIS — I2699 Other pulmonary embolism without acute cor pulmonale: Secondary | ICD-10-CM

## 2019-07-08 DIAGNOSIS — Z7901 Long term (current) use of anticoagulants: Secondary | ICD-10-CM | POA: Diagnosis not present

## 2019-07-08 LAB — POCT INR: INR: 1.8 — AB (ref 2.0–3.0)

## 2019-07-08 NOTE — Patient Instructions (Signed)
Increase dose to 1 tablet daily except 1.5 tablets each Monday, Wednesday and Friday.   Repeat INR in 3 weeks

## 2019-07-22 ENCOUNTER — Telehealth: Payer: Self-pay

## 2019-07-22 NOTE — Telephone Encounter (Signed)
lmom to reschedule appt to another day for coumadin

## 2019-07-29 ENCOUNTER — Ambulatory Visit (INDEPENDENT_AMBULATORY_CARE_PROVIDER_SITE_OTHER): Payer: Medicare Other | Admitting: Pharmacist

## 2019-07-29 ENCOUNTER — Other Ambulatory Visit: Payer: Self-pay

## 2019-07-29 DIAGNOSIS — Z7901 Long term (current) use of anticoagulants: Secondary | ICD-10-CM

## 2019-07-29 DIAGNOSIS — I2699 Other pulmonary embolism without acute cor pulmonale: Secondary | ICD-10-CM

## 2019-07-29 DIAGNOSIS — I82403 Acute embolism and thrombosis of unspecified deep veins of lower extremity, bilateral: Secondary | ICD-10-CM

## 2019-07-29 LAB — POCT INR: INR: 2.1 (ref 2.0–3.0)

## 2019-08-26 ENCOUNTER — Other Ambulatory Visit: Payer: Self-pay | Admitting: Cardiovascular Disease

## 2019-09-02 ENCOUNTER — Ambulatory Visit (INDEPENDENT_AMBULATORY_CARE_PROVIDER_SITE_OTHER): Payer: Medicare Other | Admitting: Pharmacist Clinician (PhC)/ Clinical Pharmacy Specialist

## 2019-09-02 ENCOUNTER — Other Ambulatory Visit: Payer: Self-pay

## 2019-09-02 DIAGNOSIS — Z7901 Long term (current) use of anticoagulants: Secondary | ICD-10-CM | POA: Diagnosis not present

## 2019-09-02 DIAGNOSIS — I2699 Other pulmonary embolism without acute cor pulmonale: Secondary | ICD-10-CM | POA: Diagnosis not present

## 2019-09-02 DIAGNOSIS — I82403 Acute embolism and thrombosis of unspecified deep veins of lower extremity, bilateral: Secondary | ICD-10-CM

## 2019-09-02 LAB — POCT INR: INR: 1.3 — AB (ref 2.0–3.0)

## 2019-09-02 NOTE — Patient Instructions (Signed)
Talk to insurance agent about potential changes to your Medicare supplement.  You should have a part D (drug plan)

## 2019-09-16 ENCOUNTER — Ambulatory Visit (INDEPENDENT_AMBULATORY_CARE_PROVIDER_SITE_OTHER): Payer: Medicare Other | Admitting: Pharmacist

## 2019-09-16 ENCOUNTER — Other Ambulatory Visit: Payer: Self-pay

## 2019-09-16 DIAGNOSIS — Z7901 Long term (current) use of anticoagulants: Secondary | ICD-10-CM | POA: Diagnosis not present

## 2019-09-16 DIAGNOSIS — I82403 Acute embolism and thrombosis of unspecified deep veins of lower extremity, bilateral: Secondary | ICD-10-CM

## 2019-09-16 DIAGNOSIS — I2699 Other pulmonary embolism without acute cor pulmonale: Secondary | ICD-10-CM

## 2019-09-16 LAB — POCT INR: INR: 2.2 (ref 2.0–3.0)

## 2019-10-03 DIAGNOSIS — D2262 Melanocytic nevi of left upper limb, including shoulder: Secondary | ICD-10-CM | POA: Diagnosis not present

## 2019-10-03 DIAGNOSIS — L820 Inflamed seborrheic keratosis: Secondary | ICD-10-CM | POA: Diagnosis not present

## 2019-10-03 DIAGNOSIS — L905 Scar conditions and fibrosis of skin: Secondary | ICD-10-CM | POA: Diagnosis not present

## 2019-10-03 DIAGNOSIS — D485 Neoplasm of uncertain behavior of skin: Secondary | ICD-10-CM | POA: Diagnosis not present

## 2019-10-03 DIAGNOSIS — D225 Melanocytic nevi of trunk: Secondary | ICD-10-CM | POA: Diagnosis not present

## 2019-10-03 DIAGNOSIS — D045 Carcinoma in situ of skin of trunk: Secondary | ICD-10-CM | POA: Diagnosis not present

## 2019-10-16 ENCOUNTER — Ambulatory Visit (INDEPENDENT_AMBULATORY_CARE_PROVIDER_SITE_OTHER): Payer: Medicare Other | Admitting: Pharmacist

## 2019-10-16 ENCOUNTER — Other Ambulatory Visit: Payer: Self-pay

## 2019-10-16 DIAGNOSIS — I82403 Acute embolism and thrombosis of unspecified deep veins of lower extremity, bilateral: Secondary | ICD-10-CM | POA: Diagnosis not present

## 2019-10-16 DIAGNOSIS — I2699 Other pulmonary embolism without acute cor pulmonale: Secondary | ICD-10-CM | POA: Diagnosis not present

## 2019-10-16 DIAGNOSIS — Z7901 Long term (current) use of anticoagulants: Secondary | ICD-10-CM | POA: Diagnosis not present

## 2019-10-16 LAB — POCT INR: INR: 2.5 (ref 2.0–3.0)

## 2019-10-17 ENCOUNTER — Other Ambulatory Visit: Payer: Self-pay | Admitting: Internal Medicine

## 2019-10-18 NOTE — Telephone Encounter (Signed)
zaleplon refill e-sent

## 2019-11-12 ENCOUNTER — Encounter: Payer: Self-pay | Admitting: Cardiovascular Disease

## 2019-11-12 ENCOUNTER — Other Ambulatory Visit: Payer: Self-pay

## 2019-11-12 ENCOUNTER — Ambulatory Visit (INDEPENDENT_AMBULATORY_CARE_PROVIDER_SITE_OTHER): Payer: Medicare Other | Admitting: Pharmacist Clinician (PhC)/ Clinical Pharmacy Specialist

## 2019-11-12 ENCOUNTER — Ambulatory Visit (INDEPENDENT_AMBULATORY_CARE_PROVIDER_SITE_OTHER): Payer: Medicare Other | Admitting: Cardiovascular Disease

## 2019-11-12 VITALS — BP 168/100 | HR 90 | Ht 62.0 in | Wt 241.6 lb

## 2019-11-12 DIAGNOSIS — E781 Pure hyperglyceridemia: Secondary | ICD-10-CM

## 2019-11-12 DIAGNOSIS — I2699 Other pulmonary embolism without acute cor pulmonale: Secondary | ICD-10-CM

## 2019-11-12 DIAGNOSIS — Z7901 Long term (current) use of anticoagulants: Secondary | ICD-10-CM

## 2019-11-12 DIAGNOSIS — R0600 Dyspnea, unspecified: Secondary | ICD-10-CM

## 2019-11-12 DIAGNOSIS — I2694 Multiple subsegmental pulmonary emboli without acute cor pulmonale: Secondary | ICD-10-CM

## 2019-11-12 DIAGNOSIS — I519 Heart disease, unspecified: Secondary | ICD-10-CM | POA: Diagnosis not present

## 2019-11-12 DIAGNOSIS — R0609 Other forms of dyspnea: Secondary | ICD-10-CM

## 2019-11-12 DIAGNOSIS — I82403 Acute embolism and thrombosis of unspecified deep veins of lower extremity, bilateral: Secondary | ICD-10-CM

## 2019-11-12 DIAGNOSIS — G4733 Obstructive sleep apnea (adult) (pediatric): Secondary | ICD-10-CM

## 2019-11-12 DIAGNOSIS — I5042 Chronic combined systolic (congestive) and diastolic (congestive) heart failure: Secondary | ICD-10-CM

## 2019-11-12 LAB — POCT INR: INR: 1.9 — AB (ref 2.0–3.0)

## 2019-11-12 MED ORDER — METOPROLOL SUCCINATE ER 100 MG PO TB24: 100.0000 mg | ORAL_TABLET | Freq: Every day | ORAL | 3 refills | Status: DC

## 2019-11-12 MED ORDER — LISINOPRIL 5 MG PO TABS: 5.0000 mg | ORAL_TABLET | Freq: Every day | ORAL | 3 refills | Status: DC

## 2019-11-12 NOTE — Assessment & Plan Note (Signed)
History of essential hypertension blood pressure measured today at 168/100.  She is on lisinopril and Toprol although she takes them not according to the prescription.

## 2019-11-12 NOTE — Progress Notes (Signed)
11/12/2019 Nicole Gibbs   Feb 22, 1946  [id number]  Primary Physician Nicole Gravel, MD Primary Cardiologist: Nicole Harp MD Nicole Gibbs, [place]  HPI:  Nicole Gibbs is a 74 y.o.  severely overweight, married Caucasian female, mother of one child, who I last saw him in the office 05/15/2019.  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 She had normal LV systolic function and mild RV dysfunction. Ultimately, because of vaginal bleeding, she underwent a D&C with endometrial ablation by Dr. Helane Gibbs September 05, 930, without complication. She is no longer bleeding, and her shortness of breath has improved. Her Coumadin is followed here in our office. A followup CT angiogram revealed complete resolution of her pulmonary emboli but she did have an incidentally noted multinodular goiter. Follow-up lower extremity venous Doppler study showed resolution of her DVT. She does complain of progressive dyspnea which is somewhat limiting. She has been admitted to [facility] in December and again in February with anemia and heart failure. A recent Myoview stress test showed anteroapical scar and a 2-D echo revealed an ejection fraction of 35-40% which represents this is a significant decline compared to her EF 3 years ago which was normal.. She has had a negative upper and lower endoscopy with plans to perform pill endoscopy as well. Her hemoglobin has recently remained stable. I performed outpatient cardiac catheterization on her 09/03/15 revealing normal coronary arteries with ejection fraction 35-445% consistent with a nonischemic cardiomyopathy.  Since I saw her last she is remained stable.  She has been worked up for sleep apnea by Dr. Annamaria Gibbs.    She never had the outpatient sleep study however.  She has gained an additional 10 pounds of last 6 months.   She continues to complain of shortness of breath.  She did have a 2D echo performed 06/10/2019 that showed normalization of her LV function with grade 1 diastolic dysfunction.   Current Meds  Medication Sig  . acetaminophen (TYLENOL) 325 MG tablet Take 650 mg by mouth every 6 (six) hours as needed for moderate pain.  Marland Kitchen buPROPion (WELLBUTRIN XL) 150 MG 24 hr tablet Take 150 mg by mouth daily.  . citalopram (CELEXA) 20 MG tablet Take 20 mg by mouth daily.  . furosemide (LASIX) 40 MG tablet Take 1 tablet (40 mg total) by mouth as needed for fluid or edema.  Marland Kitchen lisinopril (ZESTRIL) 5 MG tablet Take 1 tablet (5 mg total) by mouth daily.  . methimazole (TAPAZOLE) 5 MG tablet Take 5 mg by mouth daily.  . metoprolol succinate (TOPROL-XL) 100 MG 24 hr tablet Take 1 tablet (100 mg total) by mouth daily. Take with or immediately following a meal.  . Multiple Vitamin (MULTIVITAMIN) tablet Take 1 tablet by mouth daily.  . nitroGLYCERIN (NITROSTAT) 0.4 MG SL tablet Place 1 tablet (0.4 mg total) under the tongue every 5 (five) minutes as needed for chest pain.  . progesterone (PROMETRIUM) 200 MG capsule Take 200 mg by mouth at bedtime.   Marland Kitchen warfarin (COUMADIN) 3 MG tablet TAKE 1 TO 1 & 1/2 (ONE TO ONE & ONE-HALF) TABLETS BY MOUTH ONCE DAILY AS DIRECTED  . zaleplon (SONATA) 5 MG capsule TAKE 1 TO 2 CAPSULES BY MOUTH AT BEDTIME  . [DISCONTINUED] lisinopril (ZESTRIL) 5 MG tablet Take 1 tablet (5 mg total) by mouth daily.  . [DISCONTINUED]  metoprolol succinate (TOPROL-XL) 100 MG 24 hr tablet Take 1 tablet (100 mg total) by mouth daily. Take with or immediately following a meal.     Allergies  Allergen Reactions  . Keflex [Cephalexin] Diarrhea  . Sulfa Antibiotics Hives    Social History   Socioeconomic History  . Marital status: Married    Spouse name: Not on file  . Number of children: Not on file  . Years of education: Not on file  . Highest education level: Not on file  Occupational History  . Not  on file  Tobacco Use  . Smoking status: Never Smoker  . Smokeless tobacco: Never Used  Substance and Sexual Activity  . Alcohol use: No  . Drug use: No  . Sexual activity: Not on file  Other Topics Concern  . Not on file  Social History Narrative  . Not on file   Social Determinants of Health   Financial Resource Strain:   . Difficulty of Paying Living Expenses:   Food Insecurity:   . Worried About Charity fundraiser in the Last Year:   . Arboriculturist in the Last Year:   Transportation Needs:   . Film/video editor (Medical):   Marland Kitchen Lack of Transportation (Non-Medical):   Physical Activity:   . Days of Exercise per Week:   . Minutes of Exercise per Session:   Stress:   . Feeling of Stress :   Social Connections:   . Frequency of Communication with Friends and Family:   . Frequency of Social Gatherings with Friends and Family:   . Attends Religious Services:   . Active Member of Clubs or Organizations:   . Attends Archivist Meetings:   Marland Kitchen Marital Status:   Intimate Partner Violence:   . Fear of Current or Ex-Partner:   . Emotionally Abused:   Marland Kitchen Physically Abused:   . Sexually Abused:      Review of Systems: General: negative for chills, fever, night sweats or weight changes.  Cardiovascular: negative for chest pain, dyspnea on exertion, edema, orthopnea, palpitations, paroxysmal nocturnal dyspnea or shortness of breath Dermatological: negative for rash Respiratory: negative for cough or wheezing Urologic: negative for hematuria Abdominal: negative for nausea, vomiting, diarrhea, bright red blood per rectum, melena, or hematemesis Neurologic: negative for visual changes, syncope, or dizziness All other systems reviewed and are otherwise negative except as noted above.    Blood pressure (!) 168/100, pulse 90, height 5\' 2"  (1.575 m), weight 241 lb 9.6 oz (109.6 kg), SpO2 96 %.  General appearance: alert and no distress Neck: no adenopathy, no carotid  bruit, no JVD, supple, symmetrical, trachea midline and thyroid not enlarged, symmetric, no tenderness/mass/nodules Lungs: clear to auscultation bilaterally Heart: regular rate and rhythm, S1, S2 normal, no murmur, click, rub or gallop Extremities: extremities normal, atraumatic, no cyanosis or edema Pulses: 2+ and symmetric Skin: Skin color, texture, turgor normal. No rashes or lesions Neurologic: Alert and oriented X 3, normal strength and tone. Normal symmetric reflexes. Normal coordination and gait  EKG sinus rhythm at 90 with evidence of LVH with nonspecific ST and T wave changes.  I personally reviewed this EKG.  ASSESSMENT AND PLAN:   HTN (hypertension) History of essential hypertension blood pressure measured today at 168/100.  She is on lisinopril and Toprol although she takes them not according to the prescription.  Pulmonary embolism, Bilateral History of bilateral pulmonary embolism in the past on Coumadin anticoagulation.  We will check an INR  today.  Dyspnea on exertion History of dyspnea on exertion probably multifactorial from obesity, deconditioning.  Chronic combined systolic and diastolic CHF (congestive heart failure) (HCC) History of nonischemic cardiomyopathy with an EF in the 35 to 40% range in the past although recent echo performed 06/10/2019 revealed normalization of her LV function with an EF of 55% and grade 1 diastolic dysfunction.  OSA (obstructive sleep apnea) Patient is morbidly obese with nocturnal snoring and daytime somnolence.  She was being worked up by Dr. Annamaria Gibbs but never pursued an outpatient sleep study because of COVID-19.  I am going to order a home sleep study to further evaluate.      Nicole Harp MD FACP,FACC,FAHA, [facility] 11/12/2019 2:47 PM

## 2019-11-12 NOTE — Patient Instructions (Addendum)
Medication Instructions:   TAKE LISINOPRIL AND METOPROLOL BOTH ONCE DAILY  *If you need a refill on your cardiac medications before your next appointment, please call your pharmacy*   Lab Work:  Your physician recommends that you return for lab work PRIOR TO EATING  If you have labs (blood work) drawn today and your tests are completely normal, you will receive your results only by: Marland Kitchen MyChart Message (if you have MyChart) OR . A paper copy in the mail If you have any lab test that is abnormal or we need to change your treatment, we will call you to review the results.   Follow-Up: At [facility], you and your health needs are our priority.  As part of our continuing mission to provide you with exceptional heart care, we have created designated Provider Care Teams.  These Care Teams include your primary Cardiologist (physician) and Advanced Practice Providers (APPs -  Physician Assistants and Nurse Practitioners) who all work together to provide you with the care you need, when you need it.  We recommend signing up for the patient portal called "MyChart".  Sign up information is provided on this After Visit Summary.  MyChart is used to connect with patients for Virtual Visits (Telemedicine).  Patients are able to view lab/test results, encounter notes, upcoming appointments, etc.  Non-urgent messages can be sent to your provider as well.   To learn more about what you can do with MyChart, go to [email or web address].    Your next appointment:    Your physician recommends that you schedule a follow-up appointment in: [place] physician wants you to follow-up in: [place] will receive a reminder letter in the mail two months in advance. If you don't receive a letter, please call our office to schedule the follow-up appointment.    Your physician has recommended that you have a sleep study. This test records several body functions during sleep,  including: brain activity, eye movement, oxygen and carbon dioxide blood levels, heart rate and rhythm, breathing rate and rhythm, the flow of air through your mouth and nose, snoring, body muscle movements, and chest and belly movement.AT HOME STUDY FOR OSA  REFERRAL TO WEIGHT MANAGEMENT

## 2019-11-12 NOTE — Assessment & Plan Note (Signed)
History of bilateral pulmonary embolism in the past on Coumadin anticoagulation.  We will check an INR today.

## 2019-11-12 NOTE — Assessment & Plan Note (Signed)
History of dyspnea on exertion probably multifactorial from obesity, deconditioning.

## 2019-11-12 NOTE — Assessment & Plan Note (Signed)
History of nonischemic cardiomyopathy with an EF in the 35 to 40% range in the past although recent echo performed 06/10/2019 revealed normalization of her LV function with an EF of 55% and grade 1 diastolic dysfunction.

## 2019-11-12 NOTE — Assessment & Plan Note (Signed)
Patient is morbidly obese with nocturnal snoring and daytime somnolence.  She was being worked up by Dr. Annamaria Boots but never pursued an outpatient sleep study because of COVID-19.  I am going to order a home sleep study to further evaluate.

## 2019-11-13 ENCOUNTER — Telehealth: Payer: Self-pay | Admitting: *Deleted

## 2019-11-13 NOTE — Telephone Encounter (Signed)
Left message I called in regards to her sleep study recommendations. I will call again tomorrow.

## 2019-11-27 ENCOUNTER — Telehealth: Payer: Self-pay | Admitting: *Deleted

## 2019-11-27 ENCOUNTER — Other Ambulatory Visit: Payer: Self-pay | Admitting: Cardiovascular Disease

## 2019-11-27 DIAGNOSIS — G4733 Obstructive sleep apnea (adult) (pediatric): Secondary | ICD-10-CM

## 2019-11-27 NOTE — Telephone Encounter (Signed)
Left message to return a call to me to discuss her getting CPAP machine instead of having another sleep study.

## 2019-11-27 NOTE — Telephone Encounter (Signed)
Spoke with patient and she agrees to move forward with getting CPAP machine.

## 2019-11-27 NOTE — Telephone Encounter (Signed)
CPAP orders sent to [facility].

## 2019-12-04 ENCOUNTER — Other Ambulatory Visit: Payer: Self-pay | Admitting: Cardiovascular Disease

## 2019-12-16 DIAGNOSIS — E0590 Thyrotoxicosis, unspecified without thyrotoxic crisis or storm: Secondary | ICD-10-CM | POA: Diagnosis not present

## 2019-12-16 DIAGNOSIS — I10 Essential (primary) hypertension: Secondary | ICD-10-CM | POA: Diagnosis not present

## 2019-12-16 LAB — LIPID PANEL
Cholesterol: 154 (ref 0–200)
HDL: 52 (ref 35–70)
LDL Cholesterol: 84
LDl/HDL Ratio: 3
Triglycerides: 90 (ref 40–160)

## 2019-12-16 LAB — TSH: TSH: 0.75 (ref <=5.90)

## 2019-12-16 LAB — HEMOGLOBIN A1C: Hemoglobin A1C: 6

## 2019-12-19 ENCOUNTER — Other Ambulatory Visit: Payer: Self-pay | Admitting: *Deleted

## 2019-12-19 NOTE — Progress Notes (Signed)
A 

## 2019-12-22 ENCOUNTER — Encounter (HOSPITAL_BASED_OUTPATIENT_CLINIC_OR_DEPARTMENT_OTHER): Payer: Medicare Other | Admitting: Cardiovascular Disease

## 2019-12-23 DIAGNOSIS — E049 Nontoxic goiter, unspecified: Secondary | ICD-10-CM | POA: Diagnosis not present

## 2019-12-23 DIAGNOSIS — I10 Essential (primary) hypertension: Secondary | ICD-10-CM | POA: Diagnosis not present

## 2019-12-23 DIAGNOSIS — D649 Anemia, unspecified: Secondary | ICD-10-CM | POA: Diagnosis not present

## 2019-12-23 DIAGNOSIS — E0590 Thyrotoxicosis, unspecified without thyrotoxic crisis or storm: Secondary | ICD-10-CM | POA: Diagnosis not present

## 2019-12-23 DIAGNOSIS — R7302 Impaired glucose tolerance (oral): Secondary | ICD-10-CM | POA: Diagnosis not present

## 2019-12-24 ENCOUNTER — Other Ambulatory Visit: Payer: Self-pay | Admitting: Endocrinology

## 2019-12-24 ENCOUNTER — Ambulatory Visit (INDEPENDENT_AMBULATORY_CARE_PROVIDER_SITE_OTHER): Payer: Medicare Other | Admitting: Family Medicine

## 2019-12-24 DIAGNOSIS — E049 Nontoxic goiter, unspecified: Secondary | ICD-10-CM

## 2019-12-27 ENCOUNTER — Ambulatory Visit (INDEPENDENT_AMBULATORY_CARE_PROVIDER_SITE_OTHER): Payer: Medicare Other

## 2019-12-27 ENCOUNTER — Other Ambulatory Visit: Payer: Self-pay

## 2019-12-27 DIAGNOSIS — I2699 Other pulmonary embolism without acute cor pulmonale: Secondary | ICD-10-CM | POA: Diagnosis not present

## 2019-12-27 DIAGNOSIS — Z7901 Long term (current) use of anticoagulants: Secondary | ICD-10-CM

## 2019-12-27 DIAGNOSIS — I82403 Acute embolism and thrombosis of unspecified deep veins of lower extremity, bilateral: Secondary | ICD-10-CM | POA: Diagnosis not present

## 2019-12-27 LAB — POCT INR: INR: 1.6 — AB (ref 2.0–3.0)

## 2019-12-27 NOTE — Patient Instructions (Signed)
Take 2 tablets today and then Continue 1 tablet daily except 1.5 tablets each Monday, Wednesday and Friday.  Repeat INR in 6 weeks

## 2020-01-03 LAB — HEPATIC FUNCTION PANEL
ALT: 26 (ref 7–35)
AST: 20 (ref 13–35)
Alkaline Phosphatase: 71 (ref 25–125)

## 2020-01-07 ENCOUNTER — Encounter (INDEPENDENT_AMBULATORY_CARE_PROVIDER_SITE_OTHER): Payer: Self-pay | Admitting: Family Medicine

## 2020-01-07 ENCOUNTER — Other Ambulatory Visit: Payer: Self-pay

## 2020-01-07 ENCOUNTER — Ambulatory Visit (INDEPENDENT_AMBULATORY_CARE_PROVIDER_SITE_OTHER): Payer: Medicare Other | Admitting: Family Medicine

## 2020-01-07 VITALS — BP 150/92 | HR 82 | Temp 98.3°F | Ht 61.0 in | Wt 237.0 lb

## 2020-01-07 DIAGNOSIS — Z9989 Dependence on other enabling machines and devices: Secondary | ICD-10-CM

## 2020-01-07 DIAGNOSIS — F32A Depression, unspecified: Secondary | ICD-10-CM

## 2020-01-07 DIAGNOSIS — Z6841 Body Mass Index (BMI) 40.0 and over, adult: Secondary | ICD-10-CM | POA: Diagnosis not present

## 2020-01-07 DIAGNOSIS — G4733 Obstructive sleep apnea (adult) (pediatric): Secondary | ICD-10-CM | POA: Diagnosis not present

## 2020-01-07 DIAGNOSIS — R5383 Other fatigue: Secondary | ICD-10-CM

## 2020-01-07 DIAGNOSIS — Z0289 Encounter for other administrative examinations: Secondary | ICD-10-CM

## 2020-01-07 DIAGNOSIS — I119 Hypertensive heart disease without heart failure: Secondary | ICD-10-CM | POA: Diagnosis not present

## 2020-01-07 DIAGNOSIS — F39 Unspecified mood [affective] disorder: Secondary | ICD-10-CM

## 2020-01-07 DIAGNOSIS — I5042 Chronic combined systolic (congestive) and diastolic (congestive) heart failure: Secondary | ICD-10-CM

## 2020-01-07 DIAGNOSIS — F419 Anxiety disorder, unspecified: Secondary | ICD-10-CM

## 2020-01-07 DIAGNOSIS — R0602 Shortness of breath: Secondary | ICD-10-CM

## 2020-01-07 DIAGNOSIS — E038 Other specified hypothyroidism: Secondary | ICD-10-CM | POA: Diagnosis not present

## 2020-01-07 DIAGNOSIS — F329 Major depressive disorder, single episode, unspecified: Secondary | ICD-10-CM

## 2020-01-07 DIAGNOSIS — E66813 Obesity, class 3: Secondary | ICD-10-CM

## 2020-01-08 ENCOUNTER — Ambulatory Visit (INDEPENDENT_AMBULATORY_CARE_PROVIDER_SITE_OTHER): Payer: Medicare Other | Admitting: Cardiovascular Disease

## 2020-01-08 VITALS — BP 146/86 | HR 86 | Ht 61.0 in | Wt 239.8 lb

## 2020-01-08 DIAGNOSIS — Z7901 Long term (current) use of anticoagulants: Secondary | ICD-10-CM | POA: Diagnosis not present

## 2020-01-08 DIAGNOSIS — I428 Other cardiomyopathies: Secondary | ICD-10-CM

## 2020-01-08 DIAGNOSIS — I2699 Other pulmonary embolism without acute cor pulmonale: Secondary | ICD-10-CM | POA: Diagnosis not present

## 2020-01-08 DIAGNOSIS — G4733 Obstructive sleep apnea (adult) (pediatric): Secondary | ICD-10-CM

## 2020-01-08 NOTE — Progress Notes (Signed)
Dear [name],   Thank you for referring Alysiana Ethridge to our clinic. The following note includes my evaluation and treatment recommendations.  Chief Complaint:   OBESITY Millenia Waldvogel (MR# [id number]) is a 74 y.o. female who presents for evaluation and treatment of obesity and related comorbidities. Current BMI is Body mass index is 44.78 kg/m. Kindred has been struggling with her weight for many years and has been unsuccessful in either losing weight, maintaining weight loss, or reaching her healthy weight goal.  Kayelynn is currently in the action stage of change and ready to dedicate time achieving and maintaining a healthier weight. Nataya is interested in becoming our patient and working on intensive lifestyle modifications including (but not limited to) diet and exercise for weight loss.  Mercer's habits were reviewed today and are as follows: her desired weight loss is 40 pounds, she started gaining weight over the last 30 years, her heaviest weight ever was 240 pounds, she craves sugar, she is frequently drinking liquids with calories, she frequently makes poor food choices, she frequently eats larger portions than normal and she struggles with emotional eating.  Depression Screen Jayra's Food and Mood (modified PHQ-9) score was 23.  Depression screen [facility] 2/9 01/07/2020  Decreased Interest 3  Down, Depressed, Hopeless 3  PHQ - 2 Score 6  Altered sleeping 3  Tired, decreased energy 3  Change in appetite 3  Feeling bad or failure about yourself  3  Trouble concentrating 3  Moving slowly or fidgety/restless 2  Suicidal thoughts 0  PHQ-9 Score 23  Difficult doing work/chores Somewhat difficult  Some recent data might be hidden   Subjective:   1. Other fatigue Geneveive admits to daytime somnolence and admits to waking up still tired. Patent has a history of symptoms of daytime fatigue, morning fatigue and snoring. Mozetta generally gets 6 hours of sleep per night, and states that she has  poor quality sleep. Snoring is present. Apneic episodes are present per pt. Epworth Sleepiness Score is 3.  2. SOB (shortness of breath) on exertion Yazlyn notes increasing shortness of breath with exercising and seems to be worsening over time with weight gain. She notes getting out of breath sooner with activity than she used to. This has gotten worse recently. Jasimine denies shortness of breath at rest or orthopnea.  3. Hypertensive heart disease, unspecified whether heart failure present Review: taking medications as instructed, no medication side effects noted, no chest pain on exertion, no dyspnea on exertion, no swelling of ankles.  No symptoms.  Treated by Cardiology, Dr. Alvester Chou.  Blood pressure is not at goal today.   BP Readings from Last 3 Encounters:  01/08/20 (!) 146/86  01/07/20 (!) 150/92  11/12/19 (!) 168/100   4. Chronic combined systolic and diastolic CHF (congestive heart failure) ([place]) Denies new symptoms or concerns today.  5. OSA on CPAP [place] has a diagnosis of sleep apnea. She reports that she is using a CPAP regularly.  Sees Dr. Claiborne Billings of Cardiology.  Recently started on CPAP.  She has a follow-up with them in the near future.  6. Other specified hypothyroidism She sees Dr. Chalmers Cater of Endocrinology.  She is on methimazole for this, she says.  Lab Results  Component Value Date   TSH 3.347 01/22/2019   7. Anxiety and depression, with emotional eating With difficulty falling asleep.  She says this started when her husband passed 2 years ago.  She was started on medications per her PCP, but she did  not want to go on medication for this.  She had a counselor in the past, but none recently.  Assessment/Plan:   Aibhlinn's PCP is at a private practice, so I am unable to see her records.  She is a patient at [facility]  Around 2 months ago she had labs at her Endocrinologist's office, Dr. Chalmers Cater.  She says she had cholesterol and "a bunch of labs".  Will not do  labs today per her request and at checkout she was asked to sign a medical release form so we may obtain those results.   1. Other fatigue Leilanee does feel that her weight is causing her energy to be lower than it should be. Fatigue may be related to obesity, depression or many other causes. Labs will be ordered, and in the meanwhile, Anntonette will focus on self care including making healthy food choices, increasing physical activity and focusing on stress reduction. - EKG 12-Lead  2. SOB (shortness of breath) on exertion Fran does feel that she gets out of breath more easily that she used to when she exercises. Rosann's shortness of breath appears to be obesity related and exercise induced. She has agreed to work on weight loss and gradually increase exercise to treat her exercise induced shortness of breath. Will continue to monitor closely.  3. Hypertensive heart disease, unspecified whether heart failure present NOT AT GOAL.  Medication management per specialists.  Blood pressure is not at goal today, but she did not take any of her blood pressure medications yet today.   - Home blood pressure monitoring encouraged and call Cardiology if not at goal as they told you.  - Prudent nutritional plan, weight loss and close monitoring along with f/up with Cards. - Recent labs per Endocrinology.  We will attempt to obtain results!  4. Chronic combined systolic and diastolic CHF (congestive heart failure) ([place]) Management per Cardiology.  She says that she only occasionally takes Lasix, not daily.  Follow-up with them as instructed.  Recent labs per Endocrinology.  We will attempt to obtain results!  Weight loss with prudent nutritional plan, which will help.  5. OSA on CPAP Intensive lifestyle modifications are the first line treatment for this issue. We discussed lifestyle modifications today and she will continue to work on diet, exercise and weight loss efforts. Treatment plan per Sleep Medicine.   Weight loss through prudent nutritional plan.  Compliance with mask and importance of that discussed with her.  Counseling  Sleep apnea is a condition in which breathing pauses or becomes shallow during sleep. This happens over and over during the night. This disrupts your sleep and keeps your body from getting the rest that it needs, which can cause tiredness and lack of energy (fatigue) during the day.  Sleep apnea treatment: If you were given a device to open your airway while you sleep, USE IT!  Sleep hygiene:   Limit or avoid alcohol, caffeinated beverages, and cigarettes, especially close to bedtime.   Do not eat a large meal or eat spicy foods right before bedtime. This can lead to digestive discomfort that can make it hard for you to sleep.  Keep a sleep diary to help you and your health care provider figure out what could be causing your insomnia.  . Make your bedroom a dark, comfortable place where it is easy to fall asleep. ? Put up shades or blackout curtains to block light from outside. ? Use a white noise machine to block noise. ?  Keep the temperature cool. . Limit screen use before bedtime. This includes: ? Watching TV. ? Using your smartphone, tablet, or computer. . Stick to a routine that includes going to bed and waking up at the same times every day and night. This can help you fall asleep faster. Consider making a quiet activity, such as reading, part of your nighttime routine. . Try to avoid taking naps during the day so that you sleep better at night. . Get out of bed if you are still awake after 15 minutes of trying to sleep. Keep the lights down, but try reading or doing a quiet activity. When you feel sleepy, go back to bed.  6. Other specified hypothyroidism Patient with long-standing hypothyroidism, on levothyroxine therapy. She appears euthyroid. Orders and follow up as documented in patient record.  Recent labs per Endocrinology.  We will attempt to obtain  results!   Will continue to monitor.  Weight loss through prudent nutritional plan. Counseling . Good thyroid control is important for overall health. Supratherapeutic thyroid levels are dangerous and will not improve weight loss results. . The correct way to take levothyroxine is fasting, with water, separated by at least 30 minutes from breakfast, and separated by more than 4 hours from calcium, iron, multivitamins, acid reflux medications (PPIs).    7. Anxiety and depression, with emotional eating Advised her to start medications/take medications per PCP's advice of the wellbutrin they recently started pt on.   - Advised Wellbutrin can help with weight loss as well.  Go back to your counselor as this can be very helpful in addition to meds. - Recent labs per Endocrinology.  We will attempt to obtain results!  8. Class 3 severe obesity with serious comorbidity and body mass index (BMI) of 40.0 to 44.9 in adult, unspecified obesity type [facility]) Deaysia is currently in the action stage of change and her goal is to continue with weight loss efforts. I recommend Swannie begin the structured treatment plan as follows:  She has agreed to the Category 2 Plan.  Exercise goals: As is.   Behavioral modification strategies: increasing lean protein intake, decreasing eating out, no skipping meals and planning for success.  She was informed of the importance of frequent follow-up visits to maximize her success with intensive lifestyle modifications for her multiple health conditions. She was informed we would discuss her lab results at her next visit unless there is a critical issue that needs to be addressed sooner. Charlene agreed to keep her next visit at the agreed upon time to discuss these results.  Objective:   Blood pressure 150/92 and 154/78, pulse 82, temperature 98.3 F (36.8 C), height 5\' 1"  (1.549 m), weight 237 lb (107.5 kg), SpO2 95 %. Body mass index is 44.78 kg/m.  EKG: Normal sinus rhythm,  rate 81 bpm.  Indirect Calorimeter completed today shows a VO2 of 299 and a REE of 2079.  Her calculated basal metabolic rate is 3151 thus her basal metabolic rate is better than expected.  General: Cooperative, alert, well developed, in no acute distress. HEENT: Conjunctivae and lids unremarkable. Cardiovascular: Regular rhythm.  Lungs: Normal work of breathing. Neurologic: No focal deficits.   Lab Results  Component Value Date   CREATININE 0.76 01/23/2019   BUN 11 01/23/2019   NA 138 01/23/2019   K 3.5 01/23/2019   CL 104 01/23/2019   CO2 23 01/23/2019   Lab Results  Component Value Date   ALT 27 10/06/2015   AST 31 10/06/2015  ALKPHOS 113 10/06/2015   BILITOT 0.63 10/06/2015   Lab Results  Component Value Date   TSH 3.347 01/22/2019   Lab Results  Component Value Date   WBC 7.7 01/23/2019   HGB 14.6 01/23/2019   HCT 46.5 (H) 01/23/2019   MCV 91.0 01/23/2019   PLT 304 01/23/2019   Lab Results  Component Value Date   IRON 56 10/06/2015   TIBC 244 10/06/2015   FERRITIN 123 10/06/2015   Obesity Behavioral Intervention Visit Documentation for Insurance:   Approximately 15 minutes were spent on the discussion below.  ASK: We discussed the diagnosis of obesity with Prabhjot today and Merdis agreed to give [place] permission to discuss obesity behavioral modification therapy today.  ASSESS: Griselle has the diagnosis of obesity and her BMI today is 44.9. Masiel is in the action stage of change.   ADVISE: Shakemia was educated on the multiple health risks of obesity as well as the benefit of weight loss to improve her health. She was advised of the need for long term treatment and the importance of lifestyle modifications to improve her current health and to decrease her risk of future health problems.  AGREE: Multiple dietary modification options and treatment options were discussed and Mylie agreed to follow the recommendations documented in the above note.  ARRANGE: Hannahmarie  was educated on the importance of frequent visits to treat obesity as outlined per CMS and USPSTF guidelines and agreed to schedule her next follow up appointment today.  Attestation Statements:   Reviewed by clinician on day of visit: allergies, medications, problem list, medical history, surgical history, family history, social history, and previous encounter notes.  I, Water quality scientist, CMA, am acting as Location manager for [employer], DO.  I have reviewed the above documentation for accuracy and completeness, and I agree with the above. Mellody Dance, DO

## 2020-01-08 NOTE — Progress Notes (Signed)
Cardiology Office Note    Date:  01/13/2020   ID:  Vesper, Trant [birth year], MRN [id number]  PCP:  [name], MD  Cardiologist:  Shelva Majestic, MD (sleep); Dr. [name]  New sleep evaluation  History of Present Illness:  Nicole Gibbs is a 74 y.o. female who is followed by Dr. [name] for cardiology care.  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  She has been followed by Dr. [name].  Subsequently, an echo Doppler study 2017 had shown an EF of 35 to 40% in contrast to 55 to 60% in March 2014.  Cardiac catheterization in April 2017 showed normal coronary arteries with EF at 35 to 45% consistent with a nonischemic cardiomyopathy.  The patient has been seen in the past by Dr. Keturah Barre for sleep evaluation at that time presented with complaints of snoring, insomnia, disruptive sleep and needed a sleep aid.  She had undergone a diagnostic polysomnogram on February 13, 2019 which showed an overall AHI of 5.3/h and AHI during REM sleep was increased to 10.5/h.  She had significant oxygen desaturation to 83%.  She was unable to have any supine sleep during that study.  There was moderate snoring volume and moderate periodic limb movements of sleep occurred throughout the study.  Patient apparently never underwent a CPAP titration.  She apparently never had a stent and recently was seen by Dr. [name].  Over the past month, she received a new ResMed air sense 10 CPAP unit with [facility] as the DME company.  Patient was now referred to me to follow her sleep apnea and presents for initial evaluation.  She apparently received a CPAP machine on July 20 and has only used it for 5 days.  She has issues with insomnia, awakening gasping for breath, snoring, and admits to being tired most of the day.  She filled out an Epworth Sleepiness Scale score today however this endorsed at 4.  She did not wish to return to the care of  Dr. Annamaria Boots and presents for new sleep evaluation with me today.  She denies painful restless legs, bruxism, hypnagogic hallucinations or cataplectic events.  She typically goes to bed at midnight and oftentimes wakes up at 4 where she may watch television or do other things.  Ultimately she gets out of bed at 7:30 in the morning.   Past Medical History:  Diagnosis Date  . Anxiety   . Arthritis    knees  . Chronic combined systolic and diastolic CHF (congestive heart failure) ([place])    a. 07/2012 Echo: EF 55-60%, Gr1 DD;  b. 06/2015 Echo: EF 35-40%, triv AI, Mod MR, mod dil LA, mildly reduced RV.  Marland Kitchen Depression   . DVT, bilateral lower limbs ([place])   . H/O cardiovascular stress test    a. 07/2012 MV: fixed mid-dist anterior and apical defect - scar vs attenuation, distal ant HK, no reversibility, EF 51%-->low risk.  Marland Kitchen Hx of blood clots   . Hypertensive heart disease   . Hypothyroidism   . Joint pain   . Nocturia   . Normal coronary arteries    by cardiac catheterization 09/03/15  . Obese   . OSA on CPAP   . Postmenopausal vaginal bleeding   . Pulmonary embolism, bilateral ([facility])    a. chronic coumadin.  . Rash    under breasts  . Skin cancer   . Sleep apnea   . SOB (  shortness of breath)   . Swelling of both lower extremities   . Thyroid nodule    no meds    Past Surgical History:  Procedure Laterality Date  . CARDIAC CATHETERIZATION N/A 09/03/2015   Procedure: Left Heart Cath and Coronary Angiography;  Surgeon: [name], MD;  Location: [facility];  Service: Cardiovascular;  Laterality: N/A;  . CARDIOVASCULAR STRESS TEST  08-07-2012  DR HILTY/ DR BERRY   LOW RISK NUCLEAR STUDY/ NO ISCHEMIA/ FIXED MID TO DISTAL ANTERIOR AND APICAL DEFECT MAY REPRESENT SCAR VS BREAST ATTENUATION ARTIFACT/  MILD DISTAL ANTERIOR HYPOKINESIS/ EF 51%  . CESAREAN SECTION  1992  . COLONOSCOPY N/A 05/08/2015   Procedure: COLONOSCOPY;  Surgeon: Laurence Spates, MD;  Location: [facility] END[facility];   Service: Endoscopy;  Laterality: N/A;  . DILATION AND CURETTAGE OF UTERUS  06/20/2012   Procedure: DILATATION AND CURETTAGE;  Surgeon: [name], MD;  Location: [facility];  Service: Gynecology;  Laterality: N/A;  . dilation and evacution  1988   missed ab  . DILITATION & CURRETTAGE/HYSTROSCOPY WITH THERMACHOICE ABLATION N/A 09/04/2012   Procedure: DILATATION & CURETTAGE WITH THERMACHOICE ABLATION;  Surgeon: [name], MD;  Location: [facility];  Service: Gynecology;  Laterality: N/A;  . ESOPHAGOGASTRODUODENOSCOPY N/A 05/07/2015   Procedure: ESOPHAGOGASTRODUODENOSCOPY (EGD);  Surgeon: Laurence Spates, MD;  Location: [facility];  Service: Endoscopy;  Laterality: N/A;  . HYSTEROSCOPY WITH D & C  03-24-2009  . TRANSTHORACIC ECHOCARDIOGRAM  08-08-2012   LVSF NORMAL/ EF 83-66%/  GRADE I DIASTOLIC DYSFUNCTION/ MILD AV AND MV REGURG./  RVSF MILDLY REDUCED    Current Medications: Outpatient Medications Prior to Visit  Medication Sig Dispense Refill  . acetaminophen (TYLENOL) 325 MG tablet Take 650 mg by mouth every 6 (six) hours as needed for moderate pain.    . furosemide (LASIX) 40 MG tablet Take 1 tablet (40 mg total) by mouth as needed for fluid or edema.    Marland Kitchen lisinopril (ZESTRIL) 5 MG tablet Take 1 tablet (5 mg total) by mouth daily. 90 tablet 3  . methimazole (TAPAZOLE) 5 MG tablet Take 5 mg by mouth daily.    . metoprolol succinate (TOPROL-XL) 100 MG 24 hr tablet Take 1 tablet (100 mg total) by mouth daily. Take with or immediately following a meal. 90 tablet 3  . nitroGLYCERIN (NITROSTAT) 0.4 MG SL tablet Place 1 tablet (0.4 mg total) under the tongue every 5 (five) minutes as needed for chest pain. 25 tablet 3  . progesterone (PROMETRIUM) 200 MG capsule Take 200 mg by mouth at bedtime.     Marland Kitchen warfarin (COUMADIN) 3 MG tablet TAKE 1 TO 1 & 1/2 (ONE TO ONE & ONE-HALF) TABLETS BY MOUTH ONCE DAILY AS DIRECTED 120 tablet 0  . zaleplon (SONATA) 5 MG capsule TAKE 1 TO 2  CAPSULES BY MOUTH AT BEDTIME 60 capsule 5  . buPROPion (WELLBUTRIN XL) 150 MG 24 hr tablet Take 150 mg by mouth daily. (Patient not taking: Reported on 01/08/2020)    . citalopram (CELEXA) 20 MG tablet Take 20 mg by mouth daily. (Patient not taking: Reported on 01/08/2020)    . Multiple Vitamin (MULTIVITAMIN) tablet Take 1 tablet by mouth daily. (Patient not taking: Reported on 01/08/2020)     No facility-administered medications prior to visit.     Allergies:   Keflex [cephalexin] and Sulfa antibiotics   Social History   Socioeconomic History  . Marital status: Widowed    Spouse name: Not on file  .  Number of children: Not on file  . Years of education: Not on file  . Highest education level: Not on file  Occupational History  . Occupation: retired  Tobacco Use  . Smoking status: Never Smoker  . Smokeless tobacco: Never Used  Substance and Sexual Activity  . Alcohol use: No  . Drug use: No  . Sexual activity: Not on file  Other Topics Concern  . Not on file  Social History Narrative  . Not on file   Social Determinants of Health   Financial Resource Strain:   . Difficulty of Paying Living Expenses: Not on file  Food Insecurity:   . Worried About Charity fundraiser in the Last Year: Not on file  . Ran Out of Food in the Last Year: Not on file  Transportation Needs:   . Lack of Transportation (Medical): Not on file  . Lack of Transportation (Non-Medical): Not on file  Physical Activity:   . Days of Exercise per Week: Not on file  . Minutes of Exercise per Session: Not on file  Stress:   . Feeling of Stress : Not on file  Social Connections:   . Frequency of Communication with Friends and Family: Not on file  . Frequency of Social Gatherings with Friends and Family: Not on file  . Attends Religious Services: Not on file  . Active Member of Clubs or Organizations: Not on file  . Attends Archivist Meetings: Not on file  . Marital Status: Not on file      Family History:  The patient's family history includes Coronary artery disease (age of onset: 77) in her father; Diabetes in her father; Heart disease in her father; Hypertension in her mother; Mental illness in her mother; Stroke in her father; Sudden death in her sister; Thyroid disease in her mother.   ROS General: Negative; No fevers, chills, or night sweats; positive for morbid obesity HEENT: Negative; No changes in vision or hearing, sinus congestion, difficulty swallowing Pulmonary: Negative; No cough, wheezing, shortness of breath, hemoptysis Cardiovascular: Negative; No chest pain, presyncope, syncope, palpitations GI: Negative; No nausea, vomiting, diarrhea, or abdominal pain GU: Negative; No dysuria, hematuria, or difficulty voiding Musculoskeletal: Negative; no myalgias, joint pain, or weakness Hematologic/Oncology: History of DVT and PE Endocrine: Negative; no heat/cold intolerance; no diabetes Neuro: Negative; no changes in balance, headaches Skin: Negative; No rashes or skin lesions Psychiatric: Negative; No behavioral problems, depression Sleep:  Other comprehensive 14 point system review is negative.   PHYSICAL EXAM:   VS:  BP (!) 146/86 (BP Location: Left Arm, Patient Position: Sitting, Cuff Size: Large)   Pulse 86   Ht 5' 1"  (1.549 m)   Wt 239 lb 12.8 oz (108.8 kg)   SpO2 98%   BMI 45.31 kg/m     Repeat blood pressure by me was 140/78  Wt Readings from Last 3 Encounters:  01/08/20 239 lb 12.8 oz (108.8 kg)  01/07/20 237 lb (107.5 kg)  11/12/19 241 lb 9.6 oz (109.6 kg)    General: Alert, oriented, no distress.  Skin: normal turgor, no rashes, warm and dry HEENT: Normocephalic, atraumatic. Pupils equal round and reactive to light; sclera anicteric; extraocular muscles intact;  Nose without nasal septal hypertrophy Mouth/Parynx benign; Mallinpatti scale 3 Neck: No JVD, no carotid bruits; normal carotid upstroke Lungs: clear to ausculatation and percussion;  no wheezing or rales Chest wall: without tenderness to palpitation Heart: PMI not displaced, RRR, s1 s2 normal, 1/6 systolic murmur, no diastolic  murmur, no rubs, gallops, thrills, or heaves Abdomen: Central adiposity soft, nontender; no hepatosplenomehaly, BS+; abdominal aorta nontender and not dilated by palpation. Back: no CVA tenderness Pulses 2+ Musculoskeletal: full range of motion, normal strength, no joint deformities Extremities: no clubbing cyanosis or edema, Homan's sign negative  Neurologic: grossly nonfocal; Cranial nerves grossly wnl Psychologic: Normal mood and affect   Studies/Labs Reviewed:   EKG:  EKG is not  ordered today.  I personally reviewed the ECG from November 12, 2019 which showed normal sinus rhythm at 90, PVC, and LVH.  Recent Labs: BMP Latest Ref Rng & Units 01/23/2019 01/22/2019 10/06/2015  Glucose 70 - 99 mg/dL 105(H) 117(H) 85  BUN 8 - 23 mg/dL 11 14 15.8  Creatinine 0.44 - 1.00 mg/dL 0.76 0.87 0.7  Sodium 135 - 145 mmol/L 138 139 143  Potassium 3.5 - 5.1 mmol/L 3.5 3.9 4.1  Chloride 98 - 111 mmol/L 104 106 -  CO2 22 - 32 mmol/L 23 24 26   Calcium 8.9 - 10.3 mg/dL 8.3(L) 8.5(L) 8.9     Hepatic Function Latest Ref Rng & Units 10/06/2015 08/11/2015 07/03/2015  Total Protein 6.4 - 8.3 g/dL 7.0 7.1 6.2(L)  Albumin 3.5 - 5.0 g/dL 3.6 3.6 3.3(L)  AST 5 - 34 U/L 31 25 22   ALT 0 - 55 U/L 27 20 13(L)  Alk Phosphatase 40 - 150 U/L 113 103 63  Total Bilirubin 0.20 - 1.20 mg/dL 0.63 0.65 1.4(H)  Bilirubin, Direct 0.1 - 0.5 mg/dL - - -    CBC Latest Ref Rng & Units 01/23/2019 01/22/2019 10/06/2015  WBC 4.0 - 10.5 K/uL 7.7 8.4 6.1  Hemoglobin 12.0 - 15.0 g/dL 14.6 15.1(H) 13.5  Hematocrit 36 - 46 % 46.5(H) 47.4(H) 42.2  Platelets 150 - 400 K/uL 304 310 273   Lab Results  Component Value Date   MCV 91.0 01/23/2019   MCV 91.5 01/22/2019   MCV 89.0 10/06/2015   Lab Results  Component Value Date   TSH 3.347 01/22/2019   No results found for: HGBA1C   BNP     Component Value Date/Time   BNP 358.5 (H) 07/20/2015 1121    ProBNP No results found for: PROBNP   Lipid Panel  No results found for: CHOL, TRIG, HDL, CHOLHDL, VLDL, LDLCALC, LDLDIRECT, LABVLDL   RADIOLOGY: No results found.   Additional studies/ records that were reviewed today include:  I reviewed the records of Dr. Alvester Chou, Dr. Keturah Barre, and September 2020 hospitalization  ASSESSMENT:    1. OSA (obstructive sleep apnea)   2. Long term current use of anticoagulant therapy   3. Pulmonary embolism, other, unspecified chronicity, unspecified whether acute cor pulmonale present ([place]): 2014   4. Morbid obesity ([place])   5. Nonischemic cardiomyopathy ([place]): 2017 with EF improved to 55%.     PLAN:  Nicole Gibbs is a 74 year old female who has a history of morbid obesity, as well as a history of remote bilateral DVTs with subsequent pulmonary emboli in 2014.  She has a history of hypertension, as well as a nonischemic cardiomyopathy with EF in 2017 at 35 to 40%.  Her most recent echo in January 2021 showed an EF of 55% with grade 1 diastolic dysfunction, mild aortic sclerosis with mild aortic insufficiency.  She has had symptoms suggestive of sleep apnea including moderate snoring, frequent awakenings, nonrestorative sleep, awakening gasping for breath and in September 2020 underwent a diagnostic polysonogram which confirmed at least mild sleep apnea which was more prominent during  REM sleep.  She was noted to have oxygen desaturation to a nadir of 83%.  She never had a CPAP titration study.  And was not started on therapy until December 03, 2019 when she was given an AutoPap machine with initial pressure settings ranging from 6 to 20 cm.  I spent considerable time with her in the office today and reviewed her sleep study.  I also reviewed data regarding untreated sleep apnea with reference to cardiovascular health and its relation with hypertension, potential nocturnal arrhythmias with atrial  fibrillation, effect on sugar, GERD, as well as potential for nocturnal ischemia or cerebrovascular ischemia in the setting of nocturnal hypoxemia.  I reviewed with her the importance of meeting Medicare compliance standards within 90 days following CPAP initiation or run the risk that her machine may be taken away due to poor compliance.  Her most recent download from July 20 only showed 5 of 37 days of use and her average use was only 1 hour and 11 minutes.  She is not compliant.  I discussed with her importance of sleeping for 7 to 8 hours with CPAP therapy but at minimum she is required to have at least 70% of nights with usage greater than 4 hours.  Her current download does not show good data and at present I will continue her on the present AutoPap settings but her initiation pressure may ultimately need to be increased if AHI remains elevated.  I discussed with her the importance of using CPAP through the nights entirety particularly since the preponderance of REM sleep occurs in the second half of the night.  After long discussion, she has agreed to work hard to achieve compliance.  Her blood pressure today was mildly increased and on repeat by me was 140/78 which may be contributed by her lack of CPAP use.  She continues to be on furosemide 40 mg as needed for swelling in addition to lisinopril 5 mg and Toprol-XL 100 mg daily.  She continues to be on long-term anticoagulation with warfarin.  She also is on Wellbutrin and Celexa.  I will see her in 6 to 7 weeks for follow-up evaluation and further recommendations were made at that time.   Medication Adjustments/Labs and Tests Ordered: Current medicines are reviewed at length with the patient today.  Concerns regarding medicines are outlined above.  Medication changes, Labs and Tests ordered today are listed in the Patient Instructions below. Patient Instructions  Medication Instructions:  CONTINUE WITH CURRENT MEDICATIONS. NO CHANGES.  *If you need  a refill on your cardiac medications before your next appointment, please call your pharmacy*  Follow-Up: At [facility], you and your health needs are our priority.  As part of our continuing mission to provide you with exceptional heart care, we have created designated Provider Care Teams.  These Care Teams include your primary Cardiologist (physician) and Advanced Practice Providers (APPs -  Physician Assistants and Nurse Practitioners) who all work together to provide you with the care you need, when you need it.  We recommend signing up for the patient portal called "MyChart".  Sign up information is provided on this After Visit Summary.  MyChart is used to connect with patients for Virtual Visits (Telemedicine).  Patients are able to view lab/test results, encounter notes, upcoming appointments, etc.  Non-urgent messages can be sent to your provider as well.   To learn more about what you can do with MyChart, go to [email or web address].    Your next appointment:  03/03/20 at 4:40pm  The format for your next appointment:   In Person  Provider:   Shelva Majestic, MD        Signed, Shelva Majestic, MD  01/13/2020 6:52 PM    [facility] [address] [place], [place], [place]  [address] Phone: [phone]

## 2020-01-08 NOTE — Patient Instructions (Signed)
Medication Instructions:  CONTINUE WITH CURRENT MEDICATIONS. NO CHANGES.  *If you need a refill on your cardiac medications before your next appointment, please call your pharmacy*  Follow-Up: At [facility], you and your health needs are our priority.  As part of our continuing mission to provide you with exceptional heart care, we have created designated Provider Care Teams.  These Care Teams include your primary Cardiologist (physician) and Advanced Practice Providers (APPs -  Physician Assistants and Nurse Practitioners) who all work together to provide you with the care you need, when you need it.  We recommend signing up for the patient portal called "MyChart".  Sign up information is provided on this After Visit Summary.  MyChart is used to connect with patients for Virtual Visits (Telemedicine).  Patients are able to view lab/test results, encounter notes, upcoming appointments, etc.  Non-urgent messages can be sent to your provider as well.   To learn more about what you can do with MyChart, go to [email or web address].    Your next appointment:   03/03/20 at 4:40pm  The format for your next appointment:   In Person  Provider:   Shelva Majestic, MD

## 2020-01-13 ENCOUNTER — Encounter: Payer: Self-pay | Admitting: Cardiovascular Disease

## 2020-01-14 ENCOUNTER — Ambulatory Visit (INDEPENDENT_AMBULATORY_CARE_PROVIDER_SITE_OTHER): Payer: Medicare Other | Admitting: Family Medicine

## 2020-01-16 DIAGNOSIS — L821 Other seborrheic keratosis: Secondary | ICD-10-CM | POA: Diagnosis not present

## 2020-01-16 DIAGNOSIS — D045 Carcinoma in situ of skin of trunk: Secondary | ICD-10-CM | POA: Diagnosis not present

## 2020-01-21 ENCOUNTER — Ambulatory Visit (INDEPENDENT_AMBULATORY_CARE_PROVIDER_SITE_OTHER): Payer: Medicare Other | Admitting: Family Medicine

## 2020-01-21 ENCOUNTER — Other Ambulatory Visit: Payer: Self-pay

## 2020-01-21 ENCOUNTER — Encounter (INDEPENDENT_AMBULATORY_CARE_PROVIDER_SITE_OTHER): Payer: Self-pay | Admitting: Family Medicine

## 2020-01-21 VITALS — BP 123/79 | HR 76 | Temp 98.3°F | Ht 61.0 in | Wt 236.0 lb

## 2020-01-21 DIAGNOSIS — F419 Anxiety disorder, unspecified: Secondary | ICD-10-CM | POA: Diagnosis not present

## 2020-01-21 DIAGNOSIS — F329 Major depressive disorder, single episode, unspecified: Secondary | ICD-10-CM

## 2020-01-21 DIAGNOSIS — F32A Depression, unspecified: Secondary | ICD-10-CM

## 2020-01-21 DIAGNOSIS — I10 Essential (primary) hypertension: Secondary | ICD-10-CM

## 2020-01-21 DIAGNOSIS — G4733 Obstructive sleep apnea (adult) (pediatric): Secondary | ICD-10-CM

## 2020-01-21 DIAGNOSIS — E039 Hypothyroidism, unspecified: Secondary | ICD-10-CM | POA: Diagnosis not present

## 2020-01-21 DIAGNOSIS — Z6841 Body Mass Index (BMI) 40.0 and over, adult: Secondary | ICD-10-CM | POA: Diagnosis not present

## 2020-01-21 DIAGNOSIS — Z79899 Other long term (current) drug therapy: Secondary | ICD-10-CM | POA: Diagnosis not present

## 2020-01-22 NOTE — Progress Notes (Signed)
Chief Complaint:   OBESITY Nicole Gibbs is here to discuss her progress with her obesity treatment plan along with follow-up of her obesity related diagnoses. Nicole Gibbs is on the Category 2 Plan and states she is following her eating plan approximately 20% of the time. Tyrika states she is exercising for 0 minutes 0 times per week.  Today's visit was #: 2 Starting weight: 237 lbs Starting date: 01/07/2020 Today's weight: 236 lbs Today's date: 01/21/2020 Total lbs lost to date: 1 lb Total lbs lost since last in-office visit: 1 lb  Interim History: Nicole Gibbs is here for her first follow-up office visit today.  We did not get her recent lab results from Dr. Almetta Lovely office for our review today.  She also sees her PCP at [facility], Dr. Maudie Mercury.  She had a lot of unhealthy foods at home that she had to eat, instead of buying the new foods we recommended.  "All she got out of the last office visit was to eat 2 eggs."  She did not weigh meats or measure food items.  She just ate what she wanted for sugar/snacks and she feels 200 calories of sweets / ice cream is just not enough for her.    Subjective:   1. Essential hypertension Nicole Gibbs is followed by Dr. [name] for care/treatment.  She did not bring a copy of her labs with her today and we never received them from Endo/PCP.  BP Readings from Last 3 Encounters:  01/21/20 123/79  01/08/20 (!) 146/86  01/07/20 (!) 150/92   2. High risk medication use In 2014, she had bilateral DVTs and multiple PEs.  She has been anticoagulated since under the care of the [facility] through Cardiology.  She has been on Coumadin daily since.  3. OSA (obstructive sleep apnea), poorly controlled Nicole Gibbs has a diagnosis of sleep apnea. She reports that she is not using a CPAP regularly.  She was evaluated by Dr. Keturah Barre in the past, and more recently, she elected to be seen and treated by Dr. Claiborne Billings in Cardiology for her OSA instead.  She started on a CPAP on  12/03/2019, but only used it 5 out of 37 days when recently evaluated by Dr. Claiborne Billings.  4. Hypothyroidism, unspecified type She is taking Tapazole 5 mg daily.  She is treated by Endocrinology, Dr. Chalmers Cater.    5. Anxiety and depression, with emotional eating Vaishali's PCP restarted her on Wellbutrin a couple of months ago, but she just started the medication around 7 days ago.  She says she is tolerating it well without side effects.  Denies GI side effects.  She clarified that she was on it months ago but stopped because she was "on too many medications".  Assessment/Plan:   1. Essential hypertension Amaria is working on healthy weight loss and exercise to improve blood pressure control. We will watch for signs of hypotension as she continues her lifestyle modifications.  Blood pressure is much better controlled today than prior.  Continue medications and we will closely monitor along with weight loss.  Low salt diet/do not add to foods.  2. High risk medication use prudent nutritional plan is unlikely to affect INR, but she will follow food recommendations/foods to avoid told to her by the [facility] as well.  3. OSA (obstructive sleep apnea), poorly controlled Intensive lifestyle modifications are the first line treatment for this issue. We discussed several lifestyle modifications today and she will continue to work on diet, exercise and  weight loss efforts. We will continue to monitor. Orders and follow up as documented in patient record.   4. Hypothyroidism, unspecified type Advised her that we need her recent labs done at Dr. Almetta Lovely office.  She will continue treatment plan with Dr. Chalmers Cater, as well as medication management, but we will continue to monitor alongside specialist since we will see her much more regularly in the near future.  5. Anxiety and depression, with emotional eating Encouraged her again to continue mood medication daily and be consistent with them.  Also recommend she see  a therapist for CBT.  She told me she will consider it.  6. Class 3 severe obesity with serious comorbidity and body mass index (BMI) of 40.0 to 44.9 in adult, unspecified obesity type [facility]) Nicole Gibbs is currently in the action stage of change. As such, her goal is to continue with weight loss efforts. She has agreed to the Category 2 Plan.   Also today, I gave her another sheet on her Category 2 meal plan and again reviewed it in detail with her.  We discussed the importance of weighing and measuring foods, and she confirmed she received a scale from [place].  Again reiterated the importance of trying to eat all foods on plan and portion out her snacks to only 200 calories if possible.  She plans on buying foods on plan for the next couple of weeks.  Exercise goals: As is.  Behavioral modification strategies: increasing lean protein intake, decreasing simple carbohydrates, meal planning and cooking strategies, better snacking choices (healthy "ice cream options" such as Halo Top or Yasso bars or Nick's ice cream) and planning for success.  Obtain blood work from Endocrinology and PCP for our review and extract to chart.  We may need additional labs in the future if not all done that we would like done.  She signed papers today.  [place] has agreed to follow-up with our clinic in 2 weeks. She was informed of the importance of frequent follow-up visits to maximize her success with intensive lifestyle modifications for her multiple health conditions.   Objective:   Blood pressure 123/79, pulse 76, temperature 98.3 F (36.8 C), height 5\' 1"  (1.549 m), weight 236 lb (107 kg), SpO2 96 %. Body mass index is 44.59 kg/m.  General: Cooperative, alert, well developed, in no acute distress. HEENT: Conjunctivae and lids unremarkable. Cardiovascular: Regular rhythm.  Lungs: Normal work of breathing. Neurologic: No focal deficits.   Lab Results  Component Value Date   CREATININE 0.8 05/21/2019   BUN 15  05/21/2019   NA 141 05/21/2019   K 4.4 05/21/2019   CL 104 05/21/2019   CO2 30 (A) 05/21/2019   Lab Results  Component Value Date   ALT 26 01/03/2020   AST 20 01/03/2020   ALKPHOS 71 01/03/2020   BILITOT 0.63 10/06/2015   Lab Results  Component Value Date   TSH 0.75 12/16/2019   Lab Results  Component Value Date   WBC 7.1 05/21/2019   HGB 15.8 05/21/2019   HCT 50 (A) 05/21/2019   MCV 91.0 01/23/2019   PLT 332 05/21/2019   Lab Results  Component Value Date   IRON 56 10/06/2015   TIBC 244 10/06/2015   FERRITIN 123 10/06/2015   Attestation Statements:   Reviewed by clinician on day of visit: allergies, medications, problem list, medical history, surgical history, family history, social history, and previous encounter notes.  Time spent on visit including pre-visit chart review and post-visit care and charting  was 38 minutes.   I, Water quality scientist, CMA, am acting as Location manager for [employer], DO.  I have reviewed the above documentation for accuracy and completeness, and I agree with the above. Mellody Dance, DO

## 2020-01-23 ENCOUNTER — Encounter (INDEPENDENT_AMBULATORY_CARE_PROVIDER_SITE_OTHER): Payer: Self-pay

## 2020-02-07 ENCOUNTER — Ambulatory Visit (INDEPENDENT_AMBULATORY_CARE_PROVIDER_SITE_OTHER): Payer: Medicare Other

## 2020-02-07 ENCOUNTER — Other Ambulatory Visit: Payer: Self-pay

## 2020-02-07 DIAGNOSIS — I2699 Other pulmonary embolism without acute cor pulmonale: Secondary | ICD-10-CM | POA: Diagnosis not present

## 2020-02-07 DIAGNOSIS — Z7901 Long term (current) use of anticoagulants: Secondary | ICD-10-CM | POA: Diagnosis not present

## 2020-02-07 DIAGNOSIS — I82403 Acute embolism and thrombosis of unspecified deep veins of lower extremity, bilateral: Secondary | ICD-10-CM | POA: Diagnosis not present

## 2020-02-07 LAB — POCT INR: INR: 2.4 (ref 2.0–3.0)

## 2020-02-07 NOTE — Patient Instructions (Signed)
Continue 1 tablet daily except 1.5 tablets each Monday, Wednesday and Friday.  Repeat INR in 6 weeks    

## 2020-02-11 ENCOUNTER — Other Ambulatory Visit: Payer: Self-pay

## 2020-02-11 ENCOUNTER — Ambulatory Visit (INDEPENDENT_AMBULATORY_CARE_PROVIDER_SITE_OTHER): Payer: Medicare Other | Admitting: Family Medicine

## 2020-02-11 ENCOUNTER — Encounter (INDEPENDENT_AMBULATORY_CARE_PROVIDER_SITE_OTHER): Payer: Self-pay | Admitting: Family Medicine

## 2020-02-11 VITALS — BP 144/82 | HR 85 | Temp 98.2°F | Ht 61.0 in | Wt 233.6 lb

## 2020-02-11 DIAGNOSIS — Z6841 Body Mass Index (BMI) 40.0 and over, adult: Secondary | ICD-10-CM | POA: Diagnosis not present

## 2020-02-11 DIAGNOSIS — E038 Other specified hypothyroidism: Secondary | ICD-10-CM

## 2020-02-11 DIAGNOSIS — R7303 Prediabetes: Secondary | ICD-10-CM

## 2020-02-11 DIAGNOSIS — I10 Essential (primary) hypertension: Secondary | ICD-10-CM

## 2020-02-13 NOTE — Progress Notes (Signed)
Chief Complaint:   OBESITY Nicole Gibbs is here to discuss her progress with her obesity treatment plan along with follow-up of her obesity related diagnoses. Nicole Gibbs is on the Category 2 Plan and states she is following her eating plan approximately 0% of the time. Nicole Gibbs states she is exercising for 0 minutes 0 times per week.  Today's visit was #: 3 Starting weight: 237 lbs Starting date: 01/07/2020 Today's weight: 233 lbs Today's date: 02/11/2020 Total lbs lost to date: 4 lbs Total lbs lost since last in-office visit: 3 lbs  Interim History: Nicole Gibbs says that for breakfast, she has Lynnae Sandhoff bread with a little bit of regular preserved fruits and coffee with creamer.  Nothing else.  She is not weighing proteins at all.  She is still eating the unhealthy foods in her home.  Received labs from [facility] from 12/16/2019, which was TSH, A1c, and FLP.  Subjective:   1. Prediabetes Nicole Gibbs has a diagnosis of prediabetes based on her elevated HgA1c and was informed this puts her at greater risk of developing diabetes. She continues to work on diet and exercise to decrease her risk of diabetes. She denies nausea or hypoglycemia.  She is treated by Dr. Chalmers Cater of [facility].  A1c 6.0 recently on 12/16/2019.  Her endocrinologist never told her she has prediabetes or a "sugar problem".  Lab Results  Component Value Date   HGBA1C 6.0 12/16/2019   2. Other specified hypothyroidism She is taking methimazole 5 mg daily.  Management by Endocrinology, Dr. Chalmers Cater.  Recent TSH on 12/16/2019 was within normal limits.  Lab Results  Component Value Date   TSH 0.75 12/16/2019   3. Essential hypertension Review: taking medications as instructed, no medication side effects noted, no chest pain on exertion, no dyspnea on exertion, no swelling of ankles.  She does not check her blood pressure at home.  She is taking lisinopril, Toprol XL and Lasix.  She only took her lisinopril today and not the two others.  BP Readings from Last 3  Encounters:  02/11/20 (!) 144/82  01/21/20 123/79  01/08/20 (!) 146/86   Assessment/Plan:   1. Prediabetes New.  Discussed labs with patient today.  Nicole Gibbs will continue to work on weight loss, exercise, and decreasing simple carbohydrates to help decrease the risk of diabetes.  Repeat A1c and fasting insuline around 03/17/2020.  Needs to follow meal plan and decrease simple carbs.  Extensive education done with her today regarding the "new diagnosis" for her.  Handout given.  2. Other specified hypothyroidism Discussed labs with patient today.  Patient with long-standing hypothyroidism, on levothyroxine therapy. She appears euthyroid. Orders and follow up as documented in patient record.  Medication management per specialist.  Levels at goal currently.  Counseling . Good thyroid control is important for overall health. Supratherapeutic thyroid levels are dangerous and will not improve weight loss results. . The correct way to take levothyroxine is fasting, with water, separated by at least 30 minutes from breakfast, and separated by more than 4 hours from calcium, iron, multivitamins, acid reflux medications (PPIs).   3. Essential hypertension Discussed labs with patient today.  Nicole Gibbs is working on healthy weight loss and exercise to improve blood pressure control. We will watch for signs of hypotension as she continues her lifestyle modifications.  Importance of medication adherence discussed with her.  FLP recently done at Endo office was at goal and reviewed with her today.  Follow meal plan of low salt diet.  I recommended  to her that around 03/17/2020, we should repeat all labs as well.  4. Class 3 severe obesity with serious comorbidity and body mass index (BMI) of 40.0 to 44.9 in adult, unspecified obesity type Nicole Gibbs)  Nicole Gibbs is currently in the action stage of change. As such, her goal is to continue with weight loss efforts. She has agreed to the Category 2 Plan.   Exercise goals: As  is.  Behavioral modification strategies: increasing lean protein intake, decreasing simple carbohydrates, no skipping meals, meal planning and cooking strategies, keeping healthy foods in the home and planning for success.  Repeat full set of labs around 03/17/2020.  Follow meal plan.  Nicole Gibbs has agreed to follow-up with our clinic in 2 weeks. She was informed of the importance of frequent follow-up visits to maximize her success with intensive lifestyle modifications for her multiple health conditions.   Objective:   Blood pressure (!) 144/82, pulse 85, temperature 98.2 F (36.8 C), height 5\' 1"  (1.549 m), weight 233 lb 9.6 oz (106 kg), SpO2 95 %. Body mass index is 44.14 kg/m.  General: Cooperative, alert, well developed, in no acute distress. HEENT: Conjunctivae and lids unremarkable. Cardiovascular: Regular rhythm.  Lungs: Normal work of breathing. Neurologic: No focal deficits.   Lab Results  Component Value Date   CREATININE 0.8 05/21/2019   BUN 15 05/21/2019   NA 141 05/21/2019   K 4.4 05/21/2019   CL 104 05/21/2019   CO2 30 (A) 05/21/2019   Lab Results  Component Value Date   ALT 26 01/03/2020   AST 20 01/03/2020   ALKPHOS 71 01/03/2020   BILITOT 0.63 10/06/2015   Lab Results  Component Value Date   HGBA1C 6.0 12/16/2019   Lab Results  Component Value Date   TSH 0.75 12/16/2019   Lab Results  Component Value Date   CHOL 154 12/16/2019   HDL 52 12/16/2019   LDLCALC 84 12/16/2019   TRIG 90 12/16/2019   Lab Results  Component Value Date   WBC 7.1 05/21/2019   HGB 15.8 05/21/2019   HCT 50 (A) 05/21/2019   MCV 91.0 01/23/2019   PLT 332 05/21/2019   Lab Results  Component Value Date   IRON 56 10/06/2015   TIBC 244 10/06/2015   FERRITIN 123 10/06/2015   Obesity Behavioral Intervention:   Approximately 15 minutes were spent on the discussion below.  ASK: We discussed the diagnosis of obesity with Nicole Gibbs today and Nicole Gibbs agreed to give [place] permission  to discuss obesity behavioral modification therapy today.  ASSESS: Nicole Gibbs has the diagnosis of obesity and her BMI today is 44.1. Nicole Gibbs is in the action stage of change.   ADVISE: Nicole Gibbs was educated on the multiple health risks of obesity as well as the benefit of weight loss to improve her health. She was advised of the need for long term treatment and the importance of lifestyle modifications to improve her current health and to decrease her risk of future health problems.  AGREE: Multiple dietary modification options and treatment options were discussed and Nicole Gibbs agreed to follow the recommendations documented in the above note.  ARRANGE: Nicole Gibbs was educated on the importance of frequent visits to treat obesity as outlined per CMS and USPSTF guidelines and agreed to schedule her next follow up appointment today.  Attestation Statements:   Reviewed by clinician on day of visit: allergies, medications, problem list, medical history, surgical history, family history, social history, and previous encounter notes.  I, Water quality scientist, CMA, am acting as  transcriptionist for [employer], DO.  I have reviewed the above documentation for accuracy and completeness, and I agree with the above. Mellody Dance, DO

## 2020-03-02 ENCOUNTER — Other Ambulatory Visit: Payer: Self-pay

## 2020-03-02 ENCOUNTER — Encounter (INDEPENDENT_AMBULATORY_CARE_PROVIDER_SITE_OTHER): Payer: Self-pay | Admitting: Adult Health

## 2020-03-02 ENCOUNTER — Ambulatory Visit (INDEPENDENT_AMBULATORY_CARE_PROVIDER_SITE_OTHER): Payer: Medicare Other | Admitting: Adult Health

## 2020-03-02 VITALS — BP 156/66 | HR 76 | Temp 98.0°F | Ht 61.0 in | Wt 232.0 lb

## 2020-03-02 DIAGNOSIS — E66813 Obesity, class 3: Secondary | ICD-10-CM

## 2020-03-02 DIAGNOSIS — Z6841 Body Mass Index (BMI) 40.0 and over, adult: Secondary | ICD-10-CM

## 2020-03-02 DIAGNOSIS — I10 Essential (primary) hypertension: Secondary | ICD-10-CM | POA: Diagnosis not present

## 2020-03-02 DIAGNOSIS — I82403 Acute embolism and thrombosis of unspecified deep veins of lower extremity, bilateral: Secondary | ICD-10-CM

## 2020-03-02 DIAGNOSIS — I2699 Other pulmonary embolism without acute cor pulmonale: Secondary | ICD-10-CM

## 2020-03-03 ENCOUNTER — Ambulatory Visit (INDEPENDENT_AMBULATORY_CARE_PROVIDER_SITE_OTHER): Payer: Medicare Other | Admitting: Cardiovascular Disease

## 2020-03-03 VITALS — BP 130/73 | HR 73 | Ht 60.0 in | Wt 236.4 lb

## 2020-03-03 DIAGNOSIS — I428 Other cardiomyopathies: Secondary | ICD-10-CM

## 2020-03-03 DIAGNOSIS — I2699 Other pulmonary embolism without acute cor pulmonale: Secondary | ICD-10-CM | POA: Diagnosis not present

## 2020-03-03 DIAGNOSIS — G4733 Obstructive sleep apnea (adult) (pediatric): Secondary | ICD-10-CM

## 2020-03-03 DIAGNOSIS — G4700 Insomnia, unspecified: Secondary | ICD-10-CM | POA: Diagnosis not present

## 2020-03-03 DIAGNOSIS — Z7901 Long term (current) use of anticoagulants: Secondary | ICD-10-CM | POA: Diagnosis not present

## 2020-03-03 MED ORDER — ESZOPICLONE 2 MG PO TABS: 2.0000 mg | ORAL_TABLET | Freq: Every evening | ORAL | 1 refills | Status: DC | PRN

## 2020-03-03 NOTE — Progress Notes (Signed)
Cardiology Office Note    Date:  03/05/2020   ID:  Marvin Maenza, DOB 1946/03/05, MRN [id number]  PCP:  [name], MD  Cardiologist:  Shelva Majestic, MD (sleep); Dr. [name]  F/U sleep evaluation  History of Present Illness:  Nicole Gibbs is a 74 y.o. female who is followed by Dr. [name] for cardiology care.  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  She has been followed by Dr. [name].  Subsequently, an echo Doppler study 2017 had shown an EF of 35 to 40% in contrast to 55 to 60% in March 2014.  Cardiac catheterization in April 2017 showed normal coronary arteries with EF at 35 to 45% consistent with a nonischemic cardiomyopathy.  The patient has been seen in the past by Dr. Keturah Barre for sleep evaluation at that time presented with complaints of snoring, insomnia, disruptive sleep and needed a sleep aid.  She had undergone a diagnostic polysomnogram on February 13, 2019 which showed an overall AHI of 5.3/h and AHI during REM sleep was increased to 10.5/h.  She had significant oxygen desaturation to 83%.  She was unable to have any supine sleep during that study.  There was moderate snoring volume and moderate periodic limb movements of sleep occurred throughout the study.  Patient apparently never underwent a CPAP titration.  She apparently never had a stent and recently was seen by Dr. [name].  Over the past month, she received a new ResMed air sense 10 CPAP unit with [facility] [employer] as the DME company.  Patient was now referred to me to follow her sleep apnea and presents for initial evaluation.  She apparently received a CPAP machine on December 03, 2019.  I saw her in January 13, 2020 and she had only used CPAPfor 5 days.  She has issues with insomnia, awakening gasping for breath, snoring, and admits to being tired most of the day.  She filled out an Epworth Sleepiness Scale score today however this endorsed at 4.   She did not wish to return to the care of Dr. Annamaria Boots and presented for new sleep evaluation with me today.  She denies painful restless legs, bruxism, hypnagogic hallucinations or cataplectic events.  She typically goes to bed at midnight and oftentimes wakes up at 4 where she may watch television or do other things.  Ultimately she gets out of bed at 7:30 in the morning.  During her initial evaluation, I had an extensive discussion with her and reviewed her prior findings.  Apparently, she never really went back to using CPAP and only used it for 2 days in August and 1 day in September.  Her machine was subsequently taken back.  She continues to have issues with sleep maintenance.  In the past she had remotely been on Ambien and more recently Sonata.  She does not have difficulty falling asleep but her major problem is that of sleep maintenance.  She typically goes to bed at midnight but often wakes up at 3 AM and has difficulty falling back to sleep.  Over the past 6 weeks she has been trying to lose weight and has lost 5 pounds.  She presents for follow-up evaluation.   Past Medical History:  Diagnosis Date  . Anxiety   . Arthritis    knees  . Chronic combined systolic and diastolic CHF (congestive heart failure) ([place])    a. 07/2012 Echo: EF 55-60%, Gr1 DD;  b.  06/2015 Echo: EF 35-40%, triv AI, Mod MR, mod dil LA, mildly reduced RV.  Marland Kitchen Depression   . DVT, bilateral lower limbs ([place])   . H/O cardiovascular stress test    a. 07/2012 MV: fixed mid-dist anterior and apical defect - scar vs attenuation, distal ant HK, no reversibility, EF 51%-->low risk.  Marland Kitchen Hx of blood clots   . Hypertensive heart disease   . Hypothyroidism   . Joint pain   . Nocturia   . Normal coronary arteries    by cardiac catheterization 09/03/15  . Obese   . OSA on CPAP   . Postmenopausal vaginal bleeding   . Pulmonary embolism, bilateral ([facility])    a. chronic coumadin.  . Rash    under breasts  . Skin cancer   . Sleep  apnea   . SOB (shortness of breath)   . Swelling of both lower extremities   . Thyroid nodule    no meds    Past Surgical History:  Procedure Laterality Date  . CARDIAC CATHETERIZATION N/A 09/03/2015   Procedure: Left Heart Cath and Coronary Angiography;  Surgeon: [name], MD;  Location: [facility];  Service: Cardiovascular;  Laterality: N/A;  . CARDIOVASCULAR STRESS TEST  08-07-2012  DR HILTY/ DR BERRY   LOW RISK NUCLEAR STUDY/ NO ISCHEMIA/ FIXED MID TO DISTAL ANTERIOR AND APICAL DEFECT MAY REPRESENT SCAR VS BREAST ATTENUATION ARTIFACT/  MILD DISTAL ANTERIOR HYPOKINESIS/ EF 51%  . CESAREAN SECTION  1992  . COLONOSCOPY N/A 05/08/2015   Procedure: COLONOSCOPY;  Surgeon: Laurence Spates, MD;  Location: [facility];  Service: Endoscopy;  Laterality: N/A;  . DILATION AND CURETTAGE OF UTERUS  06/20/2012   Procedure: DILATATION AND CURETTAGE;  Surgeon: [name], MD;  Location: [facility];  Service: Gynecology;  Laterality: N/A;  . dilation and evacution  1988   missed ab  . DILITATION & CURRETTAGE/HYSTROSCOPY WITH THERMACHOICE ABLATION N/A 09/04/2012   Procedure: DILATATION & CURETTAGE WITH THERMACHOICE ABLATION;  Surgeon: [name], MD;  Location: [facility];  Service: Gynecology;  Laterality: N/A;  . ESOPHAGOGASTRODUODENOSCOPY N/A 05/07/2015   Procedure: ESOPHAGOGASTRODUODENOSCOPY (EGD);  Surgeon: Laurence Spates, MD;  Location: [facility];  Service: Endoscopy;  Laterality: N/A;  . HYSTEROSCOPY WITH D & C  03-24-2009  . TRANSTHORACIC ECHOCARDIOGRAM  08-08-2012   LVSF NORMAL/ EF 27-78%/  GRADE I DIASTOLIC DYSFUNCTION/ MILD AV AND MV REGURG./  RVSF MILDLY REDUCED    Current Medications: Outpatient Medications Prior to Visit  Medication Sig Dispense Refill  . acetaminophen (TYLENOL) 325 MG tablet Take 650 mg by mouth every 6 (six) hours as needed for moderate pain.    Marland Kitchen buPROPion (WELLBUTRIN XL) 150 MG 24 hr tablet Take 150 mg by mouth daily.     .  citalopram (CELEXA) 20 MG tablet Take 20 mg by mouth daily.     . furosemide (LASIX) 40 MG tablet Take 1 tablet (40 mg total) by mouth as needed for fluid or edema.    . methimazole (TAPAZOLE) 5 MG tablet Take 5 mg by mouth daily.    . nitroGLYCERIN (NITROSTAT) 0.4 MG SL tablet Place 1 tablet (0.4 mg total) under the tongue every 5 (five) minutes as needed for chest pain. 25 tablet 3  . progesterone (PROMETRIUM) 200 MG capsule Take 200 mg by mouth at bedtime.     Marland Kitchen warfarin (COUMADIN) 3 MG tablet TAKE 1 TO 1 & 1/2 (ONE TO ONE & ONE-HALF) TABLETS BY MOUTH ONCE DAILY  AS DIRECTED 120 tablet 0  . zaleplon (SONATA) 5 MG capsule TAKE 1 TO 2 CAPSULES BY MOUTH AT BEDTIME 60 capsule 5  . lisinopril (ZESTRIL) 5 MG tablet Take 1 tablet (5 mg total) by mouth daily. 90 tablet 3  . metoprolol succinate (TOPROL-XL) 100 MG 24 hr tablet Take 1 tablet (100 mg total) by mouth daily. Take with or immediately following a meal. 90 tablet 3   No facility-administered medications prior to visit.     Allergies:   Keflex [cephalexin] and Sulfa antibiotics   Social History   Socioeconomic History  . Marital status: Widowed    Spouse name: Not on file  . Number of children: Not on file  . Years of education: Not on file  . Highest education level: Not on file  Occupational History  . Occupation: retired  Tobacco Use  . Smoking status: Never Smoker  . Smokeless tobacco: Never Used  Substance and Sexual Activity  . Alcohol use: No  . Drug use: No  . Sexual activity: Not on file  Other Topics Concern  . Not on file  Social History Narrative  . Not on file   Social Determinants of Health   Financial Resource Strain:   . Difficulty of Paying Living Expenses: Not on file  Food Insecurity:   . Worried About Charity fundraiser in the Last Year: Not on file  . Ran Out of Food in the Last Year: Not on file  Transportation Needs:   . Lack of Transportation (Medical): Not on file  . Lack of Transportation  (Non-Medical): Not on file  Physical Activity:   . Days of Exercise per Week: Not on file  . Minutes of Exercise per Session: Not on file  Stress:   . Feeling of Stress : Not on file  Social Connections:   . Frequency of Communication with Friends and Family: Not on file  . Frequency of Social Gatherings with Friends and Family: Not on file  . Attends Religious Services: Not on file  . Active Member of Clubs or Organizations: Not on file  . Attends Archivist Meetings: Not on file  . Marital Status: Not on file     Family History:  The patient's family history includes Coronary artery disease (age of onset: 61) in her father; Diabetes in her father; Heart disease in her father; Hypertension in her mother; Mental illness in her mother; Stroke in her father; Sudden death in her sister; Thyroid disease in her mother.   ROS General: Negative; No fevers, chills, or night sweats; positive for morbid obesity HEENT: Negative; No changes in vision or hearing, sinus congestion, difficulty swallowing Pulmonary: Negative; No cough, wheezing, shortness of breath, hemoptysis Cardiovascular: Negative; No chest pain, presyncope, syncope, palpitations GI: Negative; No nausea, vomiting, diarrhea, or abdominal pain GU: Negative; No dysuria, hematuria, or difficulty voiding Musculoskeletal: Negative; no myalgias, joint pain, or weakness Hematologic/Oncology: History of DVT and PE Endocrine: Negative; no heat/cold intolerance; no diabetes Neuro: Negative; no changes in balance, headaches Skin: Negative; No rashes or skin lesions Psychiatric: Negative; No behavioral problems, depression Sleep: Poor sleep maintenance.,  Nonrestorative sleep, frequent awakenings. Other comprehensive 14 point system review is negative.   PHYSICAL EXAM:   VS:  BP 130/73   Pulse 73   Ht 5' (1.524 m)   Wt 236 lb 6.4 oz (107.2 kg)   SpO2 98%   BMI 46.17 kg/m     Repeat blood pressure by me was 126/76  Wt  Readings from Last 3 Encounters:  03/03/20 236 lb 6.4 oz (107.2 kg)  03/02/20 232 lb (105.2 kg)  02/11/20 233 lb 9.6 oz (106 kg)    General: Alert, oriented, no distress.  Skin: normal turgor, no rashes, warm and dry HEENT: Normocephalic, atraumatic. Pupils equal round and reactive to light; sclera anicteric; extraocular muscles intact;  Nose without nasal septal hypertrophy Mouth/Parynx benign; Mallinpatti scale 3 Neck: Thick neck; no JVD, no carotid bruits; normal carotid upstroke Lungs;: clear to ausculatation and percussion; no wheezing or rales Chest wall: without tenderness to palpitation Heart: PMI not displaced, RRR, s1 s2 normal, 1/6 systolic murmur, no diastolic murmur, no rubs, gallops, thrills, or heaves Abdomen: soft, nontender; no hepatosplenomehaly, BS+; abdominal aorta nontender and not dilated by palpation. Back: no CVA tenderness Pulses 2+ Musculoskeletal: full range of motion, normal strength, no joint deformities Extremities: no clubbing cyanosis or edema, Homan's sign negative  Neurologic: grossly nonfocal; Cranial nerves grossly wnl Psychologic: Normal mood and affect   ECG (independently read by me): NSR with mild sinus arrythmia at [facility]  I personally reviewed the ECG from November 12, 2019 which showed normal sinus rhythm at 90, PVC, and LVH.  Recent Labs: BMP Latest Ref Rng & Units 05/21/2019 01/23/2019 01/22/2019  Glucose 70 - 99 mg/dL - 105(H) 117(H)  BUN 4 - 21 15 11 14   Creatinine 0.5 - 1.1 0.8 0.76 0.87  Sodium 137 - 147 141 138 139  Potassium 3.4 - 5.3 4.4 3.5 3.9  Chloride 99 - 108 104 104 106  CO2 13 - 22 30(A) 23 24  Calcium 8.7 - 10.7 9.0 8.3(L) 8.5(L)     Hepatic Function Latest Ref Rng & Units 01/03/2020 10/06/2015 08/11/2015  Total Protein 6.4 - 8.3 g/dL - 7.0 7.1  Albumin 3.5 - 5.0 g/dL - 3.6 3.6  AST 13 - 35 20 31 25   ALT 7 - 35 26 27 20   Alk Phosphatase 25 - 125 71 113 103  Total Bilirubin 0.20 - 1.20 mg/dL - 0.63 0.65  Bilirubin, Direct  0.1 - 0.5 mg/dL - - -    CBC Latest Ref Rng & Units 05/21/2019 01/23/2019 01/22/2019  WBC - 7.1 7.7 8.4  Hemoglobin 12.0 - 16.0 15.8 14.6 15.1(H)  Hematocrit 36 - 46 50(A) 46.5(H) 47.4(H)  Platelets 150 - 399 332 304 310   Lab Results  Component Value Date   MCV 91.0 01/23/2019   MCV 91.5 01/22/2019   MCV 89.0 10/06/2015   Lab Results  Component Value Date   TSH 0.75 12/16/2019   Lab Results  Component Value Date   HGBA1C 6.0 12/16/2019     BNP    Component Value Date/Time   BNP 358.5 (H) 07/20/2015 1121    ProBNP No results found for: PROBNP   Lipid Panel     Component Value Date/Time   CHOL 154 12/16/2019 0000   TRIG 90 12/16/2019 0000   HDL 52 12/16/2019 0000   LDLCALC 84 12/16/2019 0000     RADIOLOGY: No results found.   Additional studies/ records that were reviewed today include:  I reviewed the records of Dr. [name], Dr. Keturah Barre, and September 2020 hospitalization  ASSESSMENT:    1. OSA (obstructive sleep apnea)   2. Frequent nocturnal awakening   3. Morbid obesity ([place])   4. Pulmonary embolism, unspecified chronicity, unspecified pulmonary embolism type, unspecified whether acute cor pulmonale present ([place])   5. Nonischemic cardiomyopathy ([place]): 2017 with EF improved to 55%.  6. Long term current use of anticoagulant therapy     PLAN:  Ms. Antionette Luster is a 74 year old female who has a history of morbid obesity, as well as a history of remote bilateral DVTs with subsequent pulmonary emboli in 2014.  She has a history of hypertension, as well as a nonischemic cardiomyopathy with EF in 2017 at 35 to 40%.  Her most recent echo in January 2021 showed an EF of 55% with grade 1 diastolic dysfunction, mild aortic sclerosis with mild aortic insufficiency.  She had undergone a sleep study as a result of symptoms suggestive of sleep apnea including moderate snoring, frequent awakenings, nonrestorative sleep, and awakening gasping for breath.   Her diagnostic  polysomnogram confirmed mild overall sleep apnea which was worse during REM sleep with an AHI of 10.5.  She had significant oxygen desaturation to a nadir of 83%.  Unfortunately she was never able to have a CPAP titration study and AutoPap had been instituted.  She continued to have difficulty utilizing CPAP and states it was just not for her.  As result, due to poor compliance her machine was ultimately taken back.  Presently, she continues to have difficulty with poor sleep.  She has significant difficulty with sleep maintenance and she believes Sonata which was originally prescribed by Dr. Julianne Rice office is not helping.  I discussed with her that Read Drivers has a very short half-life and is usually beneficial for sleep initiation rather than maintenance.  For this reason we will try generic Lunesta (eszopiclone) at 2 mg which will be beneficial for sleep maintenance.  I have recommended she undergo an evaluation for a customized oral appliance and will refer her to Dr. Augustina Mood for her evaluation and treatment.  I discussed the importance of continued weight loss and exercise.  I discussed improved sleep hygiene.  I will be available to see her anytime in the future as long as she is stable we will see her in 1 year.  She will  return to [facility] for her primary care.   Medication Adjustments/Labs and Tests Ordered: Current medicines are reviewed at length with the patient today.  Concerns regarding medicines are outlined above.  Medication changes, Labs and Tests ordered today are listed in the Patient Instructions below. Patient Instructions  Medication Instructions:  START taking eszopiclone 2 mg as needed at bedtime *If you need a refill on your cardiac medications before your next appointment, please call your pharmacy*   Lab Work: None ordered If you have labs (blood work) drawn today and your tests are completely normal, you will receive your results only by: Marland Kitchen MyChart Message (if  you have MyChart) OR . A paper copy in the mail If you have any lab test that is abnormal or we need to change your treatment, we will call you to review the results.   Testing/Procedures: None ordered   Follow-Up: At [facility], you and your health needs are our priority.  As part of our continuing mission to provide you with exceptional heart care, we have created designated Provider Care Teams.  These Care Teams include your primary Cardiologist (physician) and Advanced Practice Providers (APPs -  Physician Assistants and Nurse Practitioners) who all work together to provide you with the care you need, when you need it.  We recommend signing up for the patient portal called "MyChart".  Sign up information is provided on this After Visit Summary.  MyChart is used to connect with patients for Virtual Visits (Telemedicine).  Patients are able to view lab/test results, encounter notes, upcoming appointments, etc.  Non-urgent messages can be sent to your provider as well.   To learn more about what you can do with MyChart, go to [email or web address].    Your next appointment:   Follow up with Dr. Claiborne Billings on an as-needed basis  Quality Sleep Information, Adult Quality sleep is important for your mental and physical health. It also improves your quality of life. Quality sleep means you:  Are asleep for most of the time you are in bed.  Fall asleep within 30 minutes.  Wake up no more than once a night.  Are awake for no longer than 20 minutes if you do wake up during the night. Most adults need 7-8 hours of quality sleep each night. How can poor sleep affect me? If you do not get enough quality sleep, you may have:  Mood swings.  Daytime sleepiness.  Confusion.  Decreased reaction time.  Sleep disorders, such as insomnia and sleep apnea.  Difficulty with: ? Solving problems. ? Coping with stress. ? Paying attention. These issues may affect your performance and  productivity at work, school, and at home. Lack of sleep may also put you at higher risk for accidents, suicide, and risky behaviors. If you do not get quality sleep you may also be at higher risk for several health problems, including:  Infections.  Type 2 diabetes.  Heart disease.  High blood pressure.  Obesity.  Worsening of long-term conditions, like arthritis, kidney disease, depression, Parkinson's disease, and epilepsy. What actions can I take to get more quality sleep?      Stick to a sleep schedule. Go to sleep and wake up at about the same time each day. Do not try to sleep less on weekdays and make up for lost sleep on weekends. This does not work.  Try to get about 30 minutes of exercise on most days. Do not exercise 2-3 hours before going to bed.  Limit naps during the day to 30 minutes or less.  Do not use any products that contain nicotine or tobacco, such as cigarettes or e-cigarettes. If you need help quitting, ask your health care provider.  Do not drink caffeinated beverages for at least 8 hours before going to bed. Coffee, tea, and some sodas contain caffeine.  Do not drink alcohol close to bedtime.  Do not eat large meals close to bedtime.  Do not take naps in the late afternoon.  Try to get at least 30 minutes of sunlight every day. Morning sunlight is best.  Make time to relax before bed. Reading, listening to music, or taking a hot bath promotes quality sleep.  Make your bedroom a place that promotes quality sleep. Keep your bedroom dark, quiet, and at a comfortable room temperature. Make sure your bed is comfortable. Take out sleep distractions like TV, a computer, smartphone, and bright lights.  If you are lying awake in bed for longer than 20 minutes, get up and do a relaxing activity until you feel sleepy.  Work with your health care provider to treat medical conditions that may affect sleeping, such as: ? Nasal obstruction. ? Snoring. ? Sleep  apnea and other sleep disorders.  Talk to your health care provider if you think any of your prescription medicines may cause you to have difficulty falling or staying asleep.  If you have sleep problems, talk with a sleep consultant. If you think you have a sleep disorder, talk with your health  care provider about getting evaluated by a specialist. Where to find more information  [place] website: https://sleepfoundation.org  National Heart, Lung, and [place] ([place]): http://www.saunders.info/.pdf  Centers for Disease Control and Prevention (CDC): LearningDermatology.pl Contact a health care provider if you:  Have trouble getting to sleep or staying asleep.  Often wake up very early in the morning and cannot get back to sleep.  Have daytime sleepiness.  Have daytime sleep attacks of suddenly falling asleep and sudden muscle weakness (narcolepsy).  Have a tingling sensation in your legs with a strong urge to move your legs (restless legs syndrome).  Stop breathing briefly during sleep (sleep apnea).  Think you have a sleep disorder or are taking a medicine that is affecting your quality of sleep. Summary  Most adults need 7-8 hours of quality sleep each night.  Getting enough quality sleep is an important part of health and well-being.  Make your bedroom a place that promotes quality sleep and avoid things that may cause you to have poor sleep, such as alcohol, caffeine, smoking, and large meals.  Talk to your health care provider if you have trouble falling asleep or staying asleep. This information is not intended to replace advice given to you by your health care provider. Make sure you discuss any questions you have with your health care provider. Document Revised: 08/09/2017 Document Reviewed: 08/09/2017 Elsevier Patient Education  2020 [employer].     Signed, Shelva Majestic, MD  03/05/2020 2:43 PM    [facility] [address] [place], [place],   [address] Phone: [phone]

## 2020-03-03 NOTE — Patient Instructions (Signed)
Medication Instructions:  START taking eszopiclone 2 mg as needed at bedtime *If you need a refill on your cardiac medications before your next appointment, please call your pharmacy*   Lab Work: None ordered If you have labs (blood work) drawn today and your tests are completely normal, you will receive your results only by: Marland Kitchen MyChart Message (if you have MyChart) OR . A paper copy in the mail If you have any lab test that is abnormal or we need to change your treatment, we will call you to review the results.   Testing/Procedures: None ordered   Follow-Up: At [facility], you and your health needs are our priority.  As part of our continuing mission to provide you with exceptional heart care, we have created designated Provider Care Teams.  These Care Teams include your primary Cardiologist (physician) and Advanced Practice Providers (APPs -  Physician Assistants and Nurse Practitioners) who all work together to provide you with the care you need, when you need it.  We recommend signing up for the patient portal called "MyChart".  Sign up information is provided on this After Visit Summary.  MyChart is used to connect with patients for Virtual Visits (Telemedicine).  Patients are able to view lab/test results, encounter notes, upcoming appointments, etc.  Non-urgent messages can be sent to your provider as well.   To learn more about what you can do with MyChart, go to [email or web address].    Your next appointment:   Follow up with Dr. Claiborne Billings on an as-needed basis  Quality Sleep Information, Adult Quality sleep is important for your mental and physical health. It also improves your quality of life. Quality sleep means you:  Are asleep for most of the time you are in bed.  Fall asleep within 30 minutes.  Wake up no more than once a night.  Are awake for no longer than 20 minutes if you do wake up during the night. Most adults need 7-8 hours of quality sleep each  night. How can poor sleep affect me? If you do not get enough quality sleep, you may have:  Mood swings.  Daytime sleepiness.  Confusion.  Decreased reaction time.  Sleep disorders, such as insomnia and sleep apnea.  Difficulty with: ? Solving problems. ? Coping with stress. ? Paying attention. These issues may affect your performance and productivity at work, school, and at home. Lack of sleep may also put you at higher risk for accidents, suicide, and risky behaviors. If you do not get quality sleep you may also be at higher risk for several health problems, including:  Infections.  Type 2 diabetes.  Heart disease.  High blood pressure.  Obesity.  Worsening of long-term conditions, like arthritis, kidney disease, depression, Parkinson's disease, and epilepsy. What actions can I take to get more quality sleep?      Stick to a sleep schedule. Go to sleep and wake up at about the same time each day. Do not try to sleep less on weekdays and make up for lost sleep on weekends. This does not work.  Try to get about 30 minutes of exercise on most days. Do not exercise 2-3 hours before going to bed.  Limit naps during the day to 30 minutes or less.  Do not use any products that contain nicotine or tobacco, such as cigarettes or e-cigarettes. If you need help quitting, ask your health care provider.  Do not drink caffeinated beverages for at least 8 hours before going to bed. Coffee, tea,  and some sodas contain caffeine.  Do not drink alcohol close to bedtime.  Do not eat large meals close to bedtime.  Do not take naps in the late afternoon.  Try to get at least 30 minutes of sunlight every day. Morning sunlight is best.  Make time to relax before bed. Reading, listening to music, or taking a hot bath promotes quality sleep.  Make your bedroom a place that promotes quality sleep. Keep your bedroom dark, quiet, and at a comfortable room temperature. Make sure your bed  is comfortable. Take out sleep distractions like TV, a computer, smartphone, and bright lights.  If you are lying awake in bed for longer than 20 minutes, get up and do a relaxing activity until you feel sleepy.  Work with your health care provider to treat medical conditions that may affect sleeping, such as: ? Nasal obstruction. ? Snoring. ? Sleep apnea and other sleep disorders.  Talk to your health care provider if you think any of your prescription medicines may cause you to have difficulty falling or staying asleep.  If you have sleep problems, talk with a sleep consultant. If you think you have a sleep disorder, talk with your health care provider about getting evaluated by a specialist. Where to find more information  [place] website: https://sleepfoundation.org  National Heart, Lung, and [place] ([place]): http://www.saunders.info/.pdf  Centers for Disease Control and Prevention (CDC): LearningDermatology.pl Contact a health care provider if you:  Have trouble getting to sleep or staying asleep.  Often wake up very early in the morning and cannot get back to sleep.  Have daytime sleepiness.  Have daytime sleep attacks of suddenly falling asleep and sudden muscle weakness (narcolepsy).  Have a tingling sensation in your legs with a strong urge to move your legs (restless legs syndrome).  Stop breathing briefly during sleep (sleep apnea).  Think you have a sleep disorder or are taking a medicine that is affecting your quality of sleep. Summary  Most adults need 7-8 hours of quality sleep each night.  Getting enough quality sleep is an important part of health and well-being.  Make your bedroom a place that promotes quality sleep and avoid things that may cause you to have poor sleep, such as alcohol, caffeine, smoking, and large meals.  Talk to your health care provider if you have trouble falling asleep or  staying asleep. This information is not intended to replace advice given to you by your health care provider. Make sure you discuss any questions you have with your health care provider. Document Revised: 08/09/2017 Document Reviewed: 08/09/2017 Elsevier Patient Education  [place].

## 2020-03-03 NOTE — Progress Notes (Signed)
Chief Complaint:   OBESITY Nicole Gibbs is here to discuss her progress with her obesity treatment plan along with follow-up of her obesity related diagnoses. Nicole Gibbs is on the Category 2 Plan and states she is following her eating plan approximately 60% of the time. Nicole Gibbs states she is exercising 0 minutes 0 times per week.  Today's visit was #: 4 Starting weight: 237 lbs Starting date: 01/07/2020 Today's weight: 232 lbs Today's date: 03/02/2020 Total lbs lost to date: 5 Total lbs lost since last in-office visit: 1  Interim History: Nicole Gibbs feels that extra calories are sneaking in with higher calorie jelly, sweet tea, and sugary snacks. She has been trying to use the food scale to measure lunch and dinner protein servings. Per the patient, she is to limit fluid intake to 4 cups a day due to chronic cardiac conditions.  Subjective:   Essential hypertension. Systolic blood pressure is slightly elevated at today's office visit. Nicole Gibbs is on lisinopril 5 mg daily, metoprolol succinate 100 mg daily, and furosemide 40 mg PRN edema, which she estimates to use up to 3 doses per month. She reports some anxiety during weigh in today. She denies acute cardiac symptoms.  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 Nicole Gibbs is treated with warfarin and is followed by Dr. [name]. 02/07/2020 INR was 2.4.  Pulmonary embolism, other, unspecified chronicity, unspecified whether acute cor pulmonale present (Nicole Gibbs). Nicole Gibbs is treated with warfarin and is followed by Dr. [name]. 02/07/2020 INR was 2.4.  Assessment/Plan:   Essential hypertension. Nicole Gibbs is working on healthy weight loss and exercise to improve blood pressure control.  We will watch for signs of hypotension as she continues her lifestyle modifications. Nicole Gibbs will continue ACE, beta blocker, and PRN diuretic as directed.   Deep vein thrombosis (DVT) of both lower extremities, unspecified chronicity, unspecified vein ([place]). Nicole Gibbs will continue warfarin as directed and continue regular follow-up with Cardiology.  Pulmonary embolism, other, unspecified chronicity, unspecified whether acute cor pulmonale present (Nicole Gibbs). Nicole Gibbs will continue warfarin as directed and regular follow-up with Cardiology. .  Class 3 severe obesity with serious comorbidity and body mass index (BMI) of 40.0 to 44.9 in adult, unspecified obesity type ([place]).  Nicole Gibbs is currently in the action stage of change. As such, her goal is to continue with weight loss efforts. She has agreed to the Category 2 Plan. She will try to choose foods/snacks within 10:1 calorie:protein ratio. She will track snack calorie intake and keep to less than 200 calories a day.  Handout was provided on Additional Breakfast Options.  Exercise goals: No exercise has been prescribed at this time.  Behavioral modification strategies: increasing lean protein intake, decreasing simple carbohydrates, decreasing liquid calories, meal planning and cooking strategies, keeping healthy foods in the home, better snacking choices, planning for success and decreasing junk food.  Nicole Gibbs has agreed to follow-up with our clinic in 2 weeks. She was informed of the importance of frequent follow-up visits to maximize her success with intensive lifestyle modifications for her multiple health conditions.   Objective:   Blood pressure (!) 156/66, pulse 76, temperature 98 F (36.7 C), height 5\' 1"  (1.549 m), weight 232 lb (105.2 kg), SpO2 95 %. Body mass  index is 43.84 kg/m.  General: Cooperative, alert, well developed, in no acute distress. HEENT: Conjunctivae and lids unremarkable. Cardiovascular: Regular rhythm.  Lungs: Normal work of  breathing. Neurologic: No focal deficits.   Lab Results  Component Value Date   CREATININE 0.8 05/21/2019   BUN 15 05/21/2019   NA 141 05/21/2019   K 4.4 05/21/2019   CL 104 05/21/2019   CO2 30 (A) 05/21/2019   Lab Results  Component Value Date   ALT 26 01/03/2020   AST 20 01/03/2020   ALKPHOS 71 01/03/2020   BILITOT 0.63 10/06/2015   Lab Results  Component Value Date   HGBA1C 6.0 12/16/2019   No results found for: INSULIN Lab Results  Component Value Date   TSH 0.75 12/16/2019   Lab Results  Component Value Date   CHOL 154 12/16/2019   HDL 52 12/16/2019   LDLCALC 84 12/16/2019   TRIG 90 12/16/2019   Lab Results  Component Value Date   WBC 7.1 05/21/2019   HGB 15.8 05/21/2019   HCT 50 (A) 05/21/2019   MCV 91.0 01/23/2019   PLT 332 05/21/2019   Lab Results  Component Value Date   IRON 56 10/06/2015   TIBC 244 10/06/2015   FERRITIN 123 10/06/2015   Attestation Statements:   Reviewed by clinician on day of visit: allergies, medications, problem list, medical history, surgical history, family history, social history, and previous encounter notes.  Time spent on visit including pre-visit chart review and post-visit charting and care was 37 minutes.   I, Michaelene Song, am acting as Location manager for [employer], NP-C   I have reviewed the above documentation for accuracy and completeness, and I agree with the above. -  [place] Doster d. Ethylene Reznick, NP-C

## 2020-03-05 ENCOUNTER — Encounter: Payer: Self-pay | Admitting: Cardiovascular Disease

## 2020-03-06 NOTE — Addendum Note (Signed)
Addended by: Lubertha Sayres on: 03/06/2020 04:49 PM   Modules accepted: Orders

## 2020-03-10 ENCOUNTER — Other Ambulatory Visit: Payer: Self-pay | Admitting: Cardiovascular Disease

## 2020-03-17 ENCOUNTER — Encounter (INDEPENDENT_AMBULATORY_CARE_PROVIDER_SITE_OTHER): Payer: Self-pay | Admitting: Adult Health

## 2020-03-17 ENCOUNTER — Other Ambulatory Visit: Payer: Self-pay

## 2020-03-17 ENCOUNTER — Ambulatory Visit (INDEPENDENT_AMBULATORY_CARE_PROVIDER_SITE_OTHER): Payer: Medicare Other | Admitting: Adult Health

## 2020-03-17 VITALS — BP 114/69 | HR 92 | Temp 97.5°F | Ht 61.0 in | Wt 230.0 lb

## 2020-03-17 DIAGNOSIS — Z6841 Body Mass Index (BMI) 40.0 and over, adult: Secondary | ICD-10-CM | POA: Diagnosis not present

## 2020-03-17 DIAGNOSIS — R7303 Prediabetes: Secondary | ICD-10-CM | POA: Diagnosis not present

## 2020-03-17 DIAGNOSIS — I10 Essential (primary) hypertension: Secondary | ICD-10-CM

## 2020-03-17 DIAGNOSIS — G4733 Obstructive sleep apnea (adult) (pediatric): Secondary | ICD-10-CM

## 2020-03-17 NOTE — Progress Notes (Signed)
Chief Complaint:   OBESITY Nicole Gibbs is here to discuss her progress with her obesity treatment plan along with follow-up of her obesity related diagnoses. Nicole Gibbs is on the Category 2 Plan and states she is following her eating plan approximately 50% of the time. Nicole Gibbs states she is exercising 0 minutes 0 times per week.  Today's visit was #: 5 Starting weight: 237 lbs Starting date: 01/07/2020 Today's weight: 230 lbs Today's date: 03/17/2020 Total lbs lost to date: 7 Total lbs lost since last in-office visit: 2  Interim History: Nicole Gibbs was seen by Cardiology on 03/03/2020- started on eszopiclone 2 mg QHS and referred to Dr. Chestine Spore for customized oral appliance  . She reports being able to remain asleep longer with the aid of eszopiclone. She has been trying to replace full soda with diet soda and has reduced daily ice cream intake.  Subjective:   Essential hypertension. Blood pressure and heart rate are stable on today's office visit. Nicole Gibbs is on lisinopril 5 mg daily and metoprolol succinate 100 mg daily. She denies chest pain or dyspnea with exertion.  BP Readings from Last 3 Encounters:  03/17/20 114/69  03/03/20 130/73  03/02/20 (!) 156/66   Lab Results  Component Value Date   CREATININE 0.8 05/21/2019   CREATININE 0.76 01/23/2019   CREATININE 0.87 01/22/2019   OSA (obstructive sleep apnea). CPAP wasn't tolerated and machine was removed from her home. Dr. Claiborne Billings of Cardiology started the patient on eszopiclone 2 mg and referred her to Dr. Walker Kehr for customized oral appliance  .  Prediabetes. Nicole Gibbs has a diagnosis of prediabetes based on her elevated HgA1c and was informed this puts her at greater risk of developing diabetes. She continues to work on diet and exercise to decrease her risk of diabetes. She denies nausea or hypoglycemia. 12/16/2019 A1c 6.0. Marcena denies polyphagia. She has been trying to reduce her carbohydrate/sugar intake. She has been trying  to purchase less high sugary foods.  Lab Results  Component Value Date   HGBA1C 6.0 12/16/2019   No results found for: INSULIN  Assessment/Plan:   Essential hypertension. Nicole Gibbs is working on healthy weight loss and exercise to improve blood pressure control. We will watch for signs of hypotension as she continues her lifestyle modifications. She will continue her current antihypertensive therapy as directed. Labs will be checked at her next office visit.  OSA (obstructive sleep apnea). Intensive lifestyle modifications are the first line treatment for this issue. We discussed several lifestyle modifications today and she will continue to work on diet, exercise and weight loss efforts. We will continue to monitor. Orders and follow up as documented in patient record. [place] will continue eszopiclone as directed and follow-up with Dr. Toy Cookey as scheduled.  Prediabetes. Nicole Gibbs will continue to work on weight loss, exercise, and decreasing simple carbohydrates to help decrease the risk of diabetes. Labs will be checked at the time of her next office visit.  Class 3 severe obesity with serious comorbidity and body mass index (BMI) of 40.0 to 44.9 in adult, unspecified obesity type ([place]).  Nicole Gibbs is currently in the action stage of change. As such, her goal is to continue with weight loss efforts. She has agreed to the Category 2 Plan.   She was instructed to look at nutritional information on all foods, especially snack foods. Replace ice cream with [place] or [employer].  Labs will be checked at her next office visit.  Exercise goals: No exercise has been prescribed  at this time.  Behavioral modification strategies: increasing lean protein intake, decreasing simple carbohydrates, decreasing liquid calories, meal planning and cooking strategies, better snacking choices and planning for success.  Nicole Gibbs has agreed to follow-up with our clinic fasting in 2 weeks. She was informed of the importance of  frequent follow-up visits to maximize her success with intensive lifestyle modifications for her multiple health conditions.   Objective:   Blood pressure 114/69, pulse 92, temperature (!) 97.5 F (36.4 C), height 5\' 1"  (1.549 m), weight 230 lb (104.3 kg), SpO2 96 %. Body mass index is 43.46 kg/m.  General: Cooperative, alert, well developed, in no acute distress. HEENT: Conjunctivae and lids unremarkable. Cardiovascular: Regular rhythm.  Lungs: Normal work of breathing. Neurologic: No focal deficits.   Lab Results  Component Value Date   CREATININE 0.8 05/21/2019   BUN 15 05/21/2019   NA 141 05/21/2019   K 4.4 05/21/2019   CL 104 05/21/2019   CO2 30 (A) 05/21/2019   Lab Results  Component Value Date   ALT 26 01/03/2020   AST 20 01/03/2020   ALKPHOS 71 01/03/2020   BILITOT 0.63 10/06/2015   Lab Results  Component Value Date   HGBA1C 6.0 12/16/2019   No results found for: INSULIN Lab Results  Component Value Date   TSH 0.75 12/16/2019   Lab Results  Component Value Date   CHOL 154 12/16/2019   HDL 52 12/16/2019   LDLCALC 84 12/16/2019   TRIG 90 12/16/2019   Lab Results  Component Value Date   WBC 7.1 05/21/2019   HGB 15.8 05/21/2019   HCT 50 (A) 05/21/2019   MCV 91.0 01/23/2019   PLT 332 05/21/2019   Lab Results  Component Value Date   IRON 56 10/06/2015   TIBC 244 10/06/2015   FERRITIN 123 10/06/2015   Attestation Statements:   Reviewed by clinician on day of visit: allergies, medications, problem list, medical history, surgical history, family history, social history, and previous encounter notes.  Time spent on visit including pre-visit chart review and post-visit charting and care was 35 minutes.   I, Michaelene Song, am acting as Location manager for [employer], NP-C   I have reviewed the above documentation for accuracy and completeness, and I agree with the above. -  Mandolin Falwell d. Manpreet Kemmer, NP-C

## 2020-03-19 DIAGNOSIS — Z23 Encounter for immunization: Secondary | ICD-10-CM | POA: Diagnosis not present

## 2020-03-20 ENCOUNTER — Other Ambulatory Visit: Payer: Self-pay

## 2020-03-20 ENCOUNTER — Ambulatory Visit (INDEPENDENT_AMBULATORY_CARE_PROVIDER_SITE_OTHER): Payer: Medicare Other

## 2020-03-20 DIAGNOSIS — I2699 Other pulmonary embolism without acute cor pulmonale: Secondary | ICD-10-CM | POA: Diagnosis not present

## 2020-03-20 DIAGNOSIS — Z7901 Long term (current) use of anticoagulants: Secondary | ICD-10-CM | POA: Diagnosis not present

## 2020-03-20 DIAGNOSIS — I82403 Acute embolism and thrombosis of unspecified deep veins of lower extremity, bilateral: Secondary | ICD-10-CM | POA: Diagnosis not present

## 2020-03-20 LAB — POCT INR: INR: 2.5 (ref 2.0–3.0)

## 2020-03-20 NOTE — Patient Instructions (Signed)
Continue 1 tablet daily except 1.5 tablets each Monday, Wednesday and Friday.  Repeat INR in 6 weeks    

## 2020-03-24 DIAGNOSIS — H66001 Acute suppurative otitis media without spontaneous rupture of ear drum, right ear: Secondary | ICD-10-CM | POA: Diagnosis not present

## 2020-03-25 ENCOUNTER — Telehealth: Payer: Self-pay | Admitting: Cardiovascular Disease

## 2020-03-25 NOTE — Telephone Encounter (Signed)
Thanks for the update.  No interaction expected with warfarin. Okay to continue therapy as prescribed.

## 2020-03-25 NOTE — Telephone Encounter (Signed)
I have informed the patient 

## 2020-03-25 NOTE — Telephone Encounter (Signed)
Pt c/o medication issue:  1. Name of Medication: Amoxicillin Clavulanate   2. How are you currently taking this medication (dosage and times per day)? 2 times daily   3. Are you having a reaction (difficulty breathing--STAT)? No   4. What is your medication issue? Pt called and stated that pcp gave her this med for a ear infection.  Her PCP told her to run it by Dr [name] to make sure there were no interactions   Best number [phone]

## 2020-04-01 ENCOUNTER — Other Ambulatory Visit: Payer: Self-pay

## 2020-04-01 ENCOUNTER — Ambulatory Visit (INDEPENDENT_AMBULATORY_CARE_PROVIDER_SITE_OTHER): Payer: Medicare Other | Admitting: Adult Health

## 2020-04-01 ENCOUNTER — Encounter (INDEPENDENT_AMBULATORY_CARE_PROVIDER_SITE_OTHER): Payer: Self-pay | Admitting: Adult Health

## 2020-04-01 VITALS — BP 159/72 | HR 85 | Temp 98.0°F | Ht 61.0 in | Wt 230.0 lb

## 2020-04-01 DIAGNOSIS — I10 Essential (primary) hypertension: Secondary | ICD-10-CM

## 2020-04-01 DIAGNOSIS — Z6841 Body Mass Index (BMI) 40.0 and over, adult: Secondary | ICD-10-CM | POA: Diagnosis not present

## 2020-04-01 DIAGNOSIS — G4709 Other insomnia: Secondary | ICD-10-CM | POA: Diagnosis not present

## 2020-04-01 DIAGNOSIS — R739 Hyperglycemia, unspecified: Secondary | ICD-10-CM

## 2020-04-02 LAB — COMPREHENSIVE METABOLIC PANEL
ALT: 16 IU/L (ref 0–32)
AST: 19 IU/L (ref 0–40)
Albumin/Globulin Ratio: 1.5 (ref 1.2–2.2)
Albumin: 4.1 g/dL (ref 3.7–4.7)
Alkaline Phosphatase: 73 IU/L (ref 44–121)
BUN/Creatinine Ratio: 19 (ref 12–28)
BUN: 14 mg/dL (ref 8–27)
Bilirubin Total: 0.5 mg/dL (ref 0.0–1.2)
CO2: 25 mmol/L (ref 20–29)
Calcium: 9.1 mg/dL (ref 8.7–10.3)
Chloride: 100 mmol/L (ref 96–106)
Creatinine, Ser: 0.75 mg/dL (ref 0.57–1.00)
GFR calc Af Amer: 92 mL/min/{1.73_m2} (ref >59)
GFR calc non Af Amer: 80 mL/min/{1.73_m2} (ref >59)
Globulin, Total: 2.8 g/dL (ref 1.5–4.5)
Glucose: 95 mg/dL (ref 65–99)
Potassium: 4.2 mmol/L (ref 3.5–5.2)
Sodium: 139 mmol/L (ref 134–144)
Total Protein: 6.9 g/dL (ref 6.0–8.5)

## 2020-04-02 LAB — HEMOGLOBIN A1C
Est. average glucose Bld gHb Est-mCnc: 126 mg/dL
Hgb A1c MFr Bld: 6 % — ABNORMAL HIGH (ref 4.8–5.6)

## 2020-04-02 LAB — INSULIN, RANDOM: INSULIN: 13.8 u[IU]/mL (ref 2.6–24.9)

## 2020-04-02 NOTE — Progress Notes (Signed)
Chief Complaint:   OBESITY Nicole Gibbs is here to discuss her progress with her obesity treatment plan along with follow-up of her obesity related diagnoses. [name] is on the Category 2 Plan and states she is following her eating plan approximately 50% of the time. [name] states she is not exercising regularly at this time.  Today's visit was #: 6 Starting weight: 237 lbs Starting date: 01/07/2020 Today's weight: 230 lbs Today's date: 04/01/2020 Total lbs lost to date: 7 lbs Total lbs lost since last in-office visit: 0  Interim History: Nicole Gibbs reports a reduction in sugar cravings.  She estimates that she follows the program around 50% of the time.  She will supplement with granola for breakfast.  Lunch is often a sandwich, or maybe soup.  Dinner is takeout:  Often steak with french fries.  Subjective:   1. Essential hypertension Review: taking medications as instructed, no medication side effects noted, no chest pain on exertion, no dyspnea on exertion, no swelling of ankles.  Blood pressure is elevated today in the office.  She is on lisinopril 5 mg daily, metoprolol succinate XL 100 mg daily, and furosemide 40 mg as needed for edema.  She has been taking it daily.  BP Readings from Last 3 Encounters:  04/01/20 (!) 159/72  03/17/20 114/69  03/03/20 130/73   2. Hyperglycemia [name] has a history of some elevated blood glucose readings without a diagnosis of diabetes. She denies polyphagia.  Elevated blood glucose readings in Epic.  On 01/22/2019, blood glucose was 117.  She reports a recent decrease in carbohydrate/sugar cravings.  3. Other insomnia She estimates to sleep 6 hours per night and is still experiencing daytime fatigue.  She has been using eszopiclone 2 mg at bedtime.  She wa unable to use CPAP due to frequent nocturia.  Assessment/Plan:   1. Essential hypertension Nicole Gibbs is working on healthy weight loss and exercise to improve blood pressure control. We will watch for signs  of hypotension as she continues her lifestyle modifications.  Check labs today.  - Comprehensive metabolic panel  2. Hyperglycemia Fasting labs will be obtained and results with be discussed with Nicole Gibbs in 2 weeks at her follow up visit. In the meanwhile Nicole Gibbs was started on a lower simple carbohydrate diet and will work on weight loss efforts.  Will check labs today.  - Hemoglobin A1c - Insulin, random  3. Other insomnia The problem of recurrent insomnia was discussed. Orders and follow up as documented in patient record. Counseling: Intensive lifestyle modifications are the first line treatment for this issue. We discussed several lifestyle modifications today and she will continue to work on diet, exercise and weight loss efforts.  Continue eszopiclone.  Follow-up with dentist.  Counseling  Limit or avoid alcohol, caffeinated beverages, and cigarettes, especially close to bedtime.   Do not eat a large meal or eat spicy foods right before bedtime. This can lead to digestive discomfort that can make it hard for you to sleep.  Keep a sleep diary to help you and your health care provider figure out what could be causing your insomnia.  . Make your bedroom a dark, comfortable place where it is easy to fall asleep. ? Put up shades or blackout curtains to block light from outside. ? Use a white noise machine to block noise. ? Keep the temperature cool. . Limit screen use before bedtime. This includes: ? Watching TV. ? Using your smartphone, tablet, or computer. . Stick to a routine that  includes going to bed and waking up at the same times every day and night. This can help you fall asleep faster. Consider making a quiet activity, such as reading, part of your nighttime routine. . Try to avoid taking naps during the day so that you sleep better at night. . Get out of bed if you are still awake after 15 minutes of trying to sleep. Keep the lights down, but try reading or doing a quiet  activity. When you feel sleepy, go back to bed.  4. Class 3 severe obesity with serious comorbidity and body mass index (BMI) of 40.0 to 44.9 in adult, unspecified obesity type [facility])  [name] is currently in the action stage of change. As such, her goal is to continue with weight loss efforts. She has agreed to the Category 2 Plan.   Exercise goals: Seated band exercises as tolerated.  Behavioral modification strategies: increasing lean protein intake, decreasing simple carbohydrates, meal planning and cooking strategies, better snacking choices and planning for success.  Handout:  Thanksgiving/Mini Pumpkin Pies/Band Exercises/Microwave Meals  Nicole Gibbs has agreed to follow-up with our clinic in 2 weeks. She was informed of the importance of frequent follow-up visits to maximize her success with intensive lifestyle modifications for her multiple health conditions.   Nicole Gibbs was informed we would discuss her lab results at her next visit unless there is a critical issue that needs to be addressed sooner. Nicole Gibbs agreed to keep her next visit at the agreed upon time to discuss these results.  Objective:   Blood pressure (!) 159/72, pulse 85, temperature 98 F (36.7 C), height 5\' 1"  (1.549 m), weight 230 lb (104.3 kg), SpO2 96 %. Body mass index is 43.46 kg/m.  General: Cooperative, alert, well developed, in no acute distress. HEENT: Conjunctivae and lids unremarkable. Cardiovascular: Regular rhythm.  Lungs: Normal work of breathing. Neurologic: No focal deficits.   Lab Results  Component Value Date   CREATININE 0.75 04/01/2020   BUN 14 04/01/2020   NA 139 04/01/2020   K 4.2 04/01/2020   CL 100 04/01/2020   CO2 25 04/01/2020   Lab Results  Component Value Date   ALT 16 04/01/2020   AST 19 04/01/2020   ALKPHOS 73 04/01/2020   BILITOT 0.5 04/01/2020   Lab Results  Component Value Date   HGBA1C 6.0 (H) 04/01/2020   HGBA1C 6.0 12/16/2019   Lab Results  Component Value Date    INSULIN 13.8 04/01/2020   Lab Results  Component Value Date   TSH 0.75 12/16/2019   Lab Results  Component Value Date   CHOL 154 12/16/2019   HDL 52 12/16/2019   LDLCALC 84 12/16/2019   TRIG 90 12/16/2019   Lab Results  Component Value Date   WBC 7.1 05/21/2019   HGB 15.8 05/21/2019   HCT 50 (A) 05/21/2019   MCV 91.0 01/23/2019   PLT 332 05/21/2019   Lab Results  Component Value Date   IRON 56 10/06/2015   TIBC 244 10/06/2015   FERRITIN 123 10/06/2015   Obesity Behavioral Intervention:   Approximately 15 minutes were spent on the discussion below.  ASK: We discussed the diagnosis of obesity with Alisan today and Floy agreed to give [place] permission to discuss obesity behavioral modification therapy today.  ASSESS: Rena has the diagnosis of obesity and her BMI today is 43.6. Adela is in the action stage of change.   ADVISE: Ingrid was educated on the multiple health risks of obesity as well as the benefit  of weight loss to improve her health. She was advised of the need for long term treatment and the importance of lifestyle modifications to improve her current health and to decrease her risk of future health problems.  AGREE: Multiple dietary modification options and treatment options were discussed and Karne agreed to follow the recommendations documented in the above note.  ARRANGE: Takeshia was educated on the importance of frequent visits to treat obesity as outlined per CMS and USPSTF guidelines and agreed to schedule her next follow up appointment today.  Attestation Statements:   Reviewed by clinician on day of visit: allergies, medications, problem list, medical history, surgical history, family history, social history, and previous encounter notes.  I, Water quality scientist, CMA, am acting as Location manager for Mina Marble, NP.  I have reviewed the above documentation for accuracy and completeness, and I agree with the above. -  Lekisha Mcghee d. Zoriah Pulice, NP-C

## 2020-04-03 DIAGNOSIS — L820 Inflamed seborrheic keratosis: Secondary | ICD-10-CM | POA: Diagnosis not present

## 2020-04-03 DIAGNOSIS — L821 Other seborrheic keratosis: Secondary | ICD-10-CM | POA: Diagnosis not present

## 2020-04-03 DIAGNOSIS — D045 Carcinoma in situ of skin of trunk: Secondary | ICD-10-CM | POA: Diagnosis not present

## 2020-04-03 DIAGNOSIS — R739 Hyperglycemia, unspecified: Secondary | ICD-10-CM | POA: Insufficient documentation

## 2020-04-20 ENCOUNTER — Ambulatory Visit (INDEPENDENT_AMBULATORY_CARE_PROVIDER_SITE_OTHER): Payer: Medicare Other | Admitting: Adult Health

## 2020-04-20 ENCOUNTER — Other Ambulatory Visit: Payer: Self-pay

## 2020-04-20 ENCOUNTER — Encounter (INDEPENDENT_AMBULATORY_CARE_PROVIDER_SITE_OTHER): Payer: Self-pay | Admitting: Adult Health

## 2020-04-20 VITALS — BP 153/82 | HR 82 | Temp 98.0°F | Ht 61.0 in | Wt 229.0 lb

## 2020-04-20 DIAGNOSIS — Z6841 Body Mass Index (BMI) 40.0 and over, adult: Secondary | ICD-10-CM | POA: Diagnosis not present

## 2020-04-20 DIAGNOSIS — R7303 Prediabetes: Secondary | ICD-10-CM | POA: Diagnosis not present

## 2020-04-20 DIAGNOSIS — I10 Essential (primary) hypertension: Secondary | ICD-10-CM

## 2020-04-21 NOTE — Progress Notes (Signed)
Chief Complaint:   OBESITY Nicole Gibbs is here to discuss her progress with her obesity treatment plan along with follow-up of her obesity related diagnoses. Nicole Gibbs is on the Category 2 Plan and states she is following her eating plan approximately 60% of the time. Nicole Gibbs states she is exercising with bands 5 minutes 2-3 times per week.  Today's visit was #: 7 Starting weight: 237 lbs Starting date: 01/07/2020 Today's weight: 229 lbs Today's date: 04/20/2020 Total lbs lost to date: 8 Total lbs lost since last in-office visit: 1  Interim History: Nicole Gibbs has been performing banded exercises 2-3 times per week. She will be challenged to eat breakfast after waking. She feels pressured to have breakfast food in the early morning.  Subjective:   Essential hypertension. CMP on 04/01/2020 was stable. Systolic blood pressure is slightly elevated at today's office visit. Nicole Gibbs is on lisinopril 5 mg daily, Toprol XL 100 mg daily, and furosemide 40 mg PRN. Labs were discussed with the patient today.   BP Readings from Last 3 Encounters:  04/20/20 (!) 153/82  04/01/20 (!) 159/72  03/17/20 114/69   Lab Results  Component Value Date   CREATININE 0.75 04/01/2020   CREATININE 0.8 05/21/2019   CREATININE 0.76 01/23/2019   Prediabetes. Nicole Gibbs has a diagnosis of prediabetes based on her elevated HgA1c and was informed this puts her at greater risk of developing diabetes. She continues to work on diet and exercise to decrease her risk of diabetes. She denies nausea or hypoglycemia. 04/01/2020 labs showed elevated A1c and insulin levels - 6.0 and 13.8 respectively. Blood glucose within normal limits at 95. Nicole Gibbs reports increased cravings for bread and is often satisfied with Nicole Gibbs 45 calorie bread. Labs were discussed with the patient today.   Lab Results  Component Value Date   HGBA1C 6.0 (H) 04/01/2020   Lab Results  Component Value Date   INSULIN 13.8 04/01/2020   Assessment/Plan:    Essential hypertension. Nicole Gibbs is working on healthy weight loss and exercise to improve blood pressure control. We will watch for signs of hypotension as she continues her lifestyle modifications. She will continue her current antihypertensive therapy as directed.   Prediabetes. Nicole Gibbs will continue to work on weight loss, exercise, increasing protein, and decreasing simple carbohydrates to help decrease the risk of diabetes.   Class 3 severe obesity with serious comorbidity and body mass index (BMI) of 40.0 to 44.9 in adult, unspecified obesity type (Nicole Gibbs).  Nicole Gibbs is currently in the action stage of change. As such, her goal is to continue with weight loss efforts. She has agreed to the Category 2 Plan.   She was instructed to not feel pressured to consume breakfast first thing in the morning. She will enjoy her coffee and then prepare breakfast when ready - Remember timing of food isn't important.  Exercise goals: Nicole Gibbs will continue using bands 5 minutes 2-3 times per week.  Behavioral modification strategies: increasing lean protein intake, meal planning and cooking strategies, better snacking choices and planning for success.  Nicole Gibbs has agreed to follow-up with our clinic in 2 weeks. She was informed of the importance of frequent follow-up visits to maximize her success with intensive lifestyle modifications for her multiple health conditions.   Objective:   Blood pressure (!) 153/82, pulse 82, temperature 98 F (36.7 C), height 5\' 1"  (1.549 m), weight 229 lb (103.9 kg), SpO2 99 %. Body mass index is 43.27 kg/m.  General: Cooperative, alert, well developed, in no  acute distress. HEENT: Conjunctivae and lids unremarkable. Cardiovascular: Regular rhythm.  Lungs: Normal work of breathing. Neurologic: No focal deficits.   Lab Results  Component Value Date   CREATININE 0.75 04/01/2020   BUN 14 04/01/2020   NA 139 04/01/2020   K 4.2 04/01/2020   CL 100 04/01/2020   CO2 25 04/01/2020    Lab Results  Component Value Date   ALT 16 04/01/2020   AST 19 04/01/2020   ALKPHOS 73 04/01/2020   BILITOT 0.5 04/01/2020   Lab Results  Component Value Date   HGBA1C 6.0 (H) 04/01/2020   HGBA1C 6.0 12/16/2019   Lab Results  Component Value Date   INSULIN 13.8 04/01/2020   Lab Results  Component Value Date   TSH 0.75 12/16/2019   Lab Results  Component Value Date   CHOL 154 12/16/2019   HDL 52 12/16/2019   LDLCALC 84 12/16/2019   TRIG 90 12/16/2019   Lab Results  Component Value Date   WBC 7.1 05/21/2019   HGB 15.8 05/21/2019   HCT 50 (A) 05/21/2019   MCV 91.0 01/23/2019   PLT 332 05/21/2019   Lab Results  Component Value Date   IRON 56 10/06/2015   TIBC 244 10/06/2015   FERRITIN 123 10/06/2015   Attestation Statements:   Reviewed by clinician on day of visit: allergies, medications, problem list, medical history, surgical history, family history, social history, and previous encounter notes.  Time spent on visit including pre-visit chart review and post-visit charting and care was 28 minutes.   I, Michaelene Song, am acting as Location manager for [employer], NP-C   I have reviewed the above documentation for accuracy and completeness, and I agree with the above. -  Vernard Gram d. Takumi Din, NP-C

## 2020-05-01 ENCOUNTER — Ambulatory Visit (INDEPENDENT_AMBULATORY_CARE_PROVIDER_SITE_OTHER): Payer: Medicare Other

## 2020-05-01 ENCOUNTER — Other Ambulatory Visit: Payer: Self-pay

## 2020-05-01 DIAGNOSIS — I82403 Acute embolism and thrombosis of unspecified deep veins of lower extremity, bilateral: Secondary | ICD-10-CM | POA: Diagnosis not present

## 2020-05-01 DIAGNOSIS — I2699 Other pulmonary embolism without acute cor pulmonale: Secondary | ICD-10-CM

## 2020-05-01 DIAGNOSIS — Z7901 Long term (current) use of anticoagulants: Secondary | ICD-10-CM

## 2020-05-01 LAB — POCT INR: INR: 2.7 (ref 2.0–3.0)

## 2020-05-01 NOTE — Patient Instructions (Signed)
Continue 1 tablet daily except 1.5 tablets each Monday, Wednesday and Friday.  Repeat INR in 6 weeks    

## 2020-05-05 ENCOUNTER — Ambulatory Visit (INDEPENDENT_AMBULATORY_CARE_PROVIDER_SITE_OTHER): Payer: Medicare Other | Admitting: Adult Health

## 2020-05-05 ENCOUNTER — Other Ambulatory Visit: Payer: Self-pay

## 2020-05-05 ENCOUNTER — Encounter (INDEPENDENT_AMBULATORY_CARE_PROVIDER_SITE_OTHER): Payer: Self-pay | Admitting: Adult Health

## 2020-05-05 VITALS — BP 153/73 | HR 84 | Temp 97.4°F | Ht 61.0 in | Wt 228.0 lb

## 2020-05-05 DIAGNOSIS — F419 Anxiety disorder, unspecified: Secondary | ICD-10-CM | POA: Insufficient documentation

## 2020-05-05 DIAGNOSIS — Z7901 Long term (current) use of anticoagulants: Secondary | ICD-10-CM

## 2020-05-05 DIAGNOSIS — Z6841 Body Mass Index (BMI) 40.0 and over, adult: Secondary | ICD-10-CM

## 2020-05-05 DIAGNOSIS — F32A Depression, unspecified: Secondary | ICD-10-CM | POA: Diagnosis not present

## 2020-05-05 DIAGNOSIS — I10 Essential (primary) hypertension: Secondary | ICD-10-CM | POA: Diagnosis not present

## 2020-05-05 DIAGNOSIS — N939 Abnormal uterine and vaginal bleeding, unspecified: Secondary | ICD-10-CM

## 2020-05-05 NOTE — Progress Notes (Signed)
Chief Complaint:   OBESITY Nicole Gibbs is here to discuss her progress with her obesity treatment plan along with follow-up of her obesity related diagnoses. Nicole Gibbs is on the Category 2 Plan and states she is following her eating plan approximately 60% of the time. Nicole Gibbs states she is exercising 0 minutes 0 times per week.  Today's visit was #: 8 Starting weight: 237 lbs Starting date: 01/07/2020 Today's weight: 228 lbs Today's date: 05/05/2020 Total lbs lost to date: 9 Total lbs lost since last in-office visit: 1  Interim History: Nicole Gibbs's neighbor will make her a holiday cake for Christmas each year and she has been enjoying a very small slice each morning for breakfast - apple pound cake with nuts.  She continues to shop for foods on plan and has been trying to improve with meal planning and prepping.  She is still settling estates of her late husband and late mother.  The legal/financial responsibilities are quite involved and have been daunting for her to address.  Subjective:   Essential hypertension. Systolic blood pressure is slightly elevated at today's office visit, SBP readings have been elevated at last several appt's. Nicole Gibbs is on lisinopril 5 mg daily and metoprolol succinate XL 100 mg daily. She denies acute cardiac symptoms.  BP Readings from Last 3 Encounters:  05/05/20 (!) 153/73  04/20/20 (!) 153/82  04/01/20 (!) 159/72   Lab Results  Component Value Date   CREATININE 0.75 04/01/2020   CREATININE 0.8 05/21/2019   CREATININE 0.76 01/23/2019   Long term (current) use of anticoagulants. Last INR was therapeutic on 05/01/2020 at 2.7. Warfarin therapy is managed by Dr. Allyson Sabal. 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  Anxiety and depression.  Nicole Gibbs has not taken citalopram since 2019. She restarted bupropion XL 150 mg in August 2021 and took it daily until 04/30/2016. She stopped due to emotional bluntness. She  denies suicidal or homicidal ideations. She reports dizziness since abrupt cessation of bupropion.  Vaginal bleeding. Nicole Gibbs has a chronic history of vaginal bleeding for several years. She reports recurrence of vaginal bleeding that began 05/03/2020 - now she is just spotting. She reports missing 2 doses of progesterone 200 mg just prior to frank vaginal bleeding.  Assessment/Plan:   Essential hypertension. Nicole Gibbs is working on healthy weight loss and exercise to improve blood pressure control. We will watch for signs of hypotension as she continues her lifestyle modifications. She will continue her current antihypertensive therapy as directed.   Long term (current) use of anticoagulants. Nicole Gibbs will continue Coumadin as directed by Dr. Allyson Sabal.  Anxiety and depression. We recommend Nicole Gibbs titrating off bupropion XL 150 mg - 1 tab every other day for 2 weeks and then 1 tab every 3rd day until her next office visit (in 3 weeks). Ambulatory referral to Psychology placed.  Vaginal bleeding. Nicole Gibbs will follow-up with established GYN, re: vaginal bleeding. She verbalized understanding/agreement.  Class 3 severe obesity with serious comorbidity and body mass index (BMI) of 40.0 to 44.9 in adult, unspecified obesity type ([facility]).  Nicole Gibbs is currently in the action stage of change. As such, her goal is to continue with weight loss efforts. She has agreed to the Category 2 Plan.    Handouts were provided on Holiday Strategies and Holiday Recipes.  Exercise goals: No exercise has been prescribed at this time.  Behavioral modification strategies: increasing lean protein intake, meal planning and cooking strategies, holiday eating strategies  and planning  for success.  Nicole Gibbs has agreed to follow-up with our clinic in 3 weeks. She was informed of the importance of frequent follow-up visits to maximize her success with intensive lifestyle modifications for her multiple health conditions.    Objective:   Blood  pressure (!) 153/73, pulse 84, temperature (!) 97.4 F (36.3 C), height 5\' 1"  (1.549 m), weight 228 lb (103.4 kg), SpO2 95 %. Body mass index is 43.08 kg/m.  General: Cooperative, alert, well developed, in no acute distress. HEENT: Conjunctivae and lids unremarkable. Cardiovascular: Regular rhythm.  Lungs: Normal work of breathing. Neurologic: No focal deficits.   Lab Results  Component Value Date   CREATININE 0.75 04/01/2020   BUN 14 04/01/2020   NA 139 04/01/2020   K 4.2 04/01/2020   CL 100 04/01/2020   CO2 25 04/01/2020   Lab Results  Component Value Date   ALT 16 04/01/2020   AST 19 04/01/2020   ALKPHOS 73 04/01/2020   BILITOT 0.5 04/01/2020   Lab Results  Component Value Date   HGBA1C 6.0 (H) 04/01/2020   HGBA1C 6.0 12/16/2019   Lab Results  Component Value Date   INSULIN 13.8 04/01/2020   Lab Results  Component Value Date   TSH 0.75 12/16/2019   Lab Results  Component Value Date   CHOL 154 12/16/2019   HDL 52 12/16/2019   LDLCALC 84 12/16/2019   TRIG 90 12/16/2019   Lab Results  Component Value Date   WBC 7.1 05/21/2019   HGB 15.8 05/21/2019   HCT 50 (A) 05/21/2019   MCV 91.0 01/23/2019   PLT 332 05/21/2019   Lab Results  Component Value Date   IRON 56 10/06/2015   TIBC 244 10/06/2015   FERRITIN 123 10/06/2015   Attestation Statements:   Reviewed by clinician on day of visit: allergies, medications, problem list, medical history, surgical history, family history, social history, and previous encounter notes.  Time spent on visit including pre-visit chart review and post-visit charting and care was 39 minutes.   I, Michaelene Song, am acting as Location manager for [employer], NP-C   I have reviewed the above documentation for accuracy and completeness, and I agree with the above. -  Leylani Duley d. Milt Coye, NP-C

## 2020-05-14 ENCOUNTER — Ambulatory Visit: Payer: Medicare Other | Admitting: Adult Health

## 2020-05-14 ENCOUNTER — Telehealth: Payer: Self-pay | Admitting: Cardiovascular Disease

## 2020-05-14 MED ORDER — ESZOPICLONE 2 MG PO TABS: 2.0000 mg | ORAL_TABLET | Freq: Every evening | ORAL | 1 refills | Status: DC | PRN

## 2020-05-14 NOTE — Telephone Encounter (Signed)
*  STAT* If patient is at the pharmacy, call can be transferred to refill team.   1. Which medications need to be refilled? (please list name of each medication and dose if known) eszopiclone (LUNESTA) 2 MG TABS tablet  2. Which pharmacy/location (including street and city if local pharmacy) is medication to be sent to? [facility] - [address] [place], [place], [place] [address]  3. Do they need a 30 day or 90 day supply? 90 day supply  Patient states she has been completely out of medication for 4-5 days.

## 2020-05-24 NOTE — Progress Notes (Deleted)
Cardiology Office Note:    Date:  05/24/2020   ID:  Nicole Gibbs, DOB [birth year], MRN [id number]  PCP:  [name], MD  Cardiologist:  Quay Burow, MD/ Shelva Majestic, MD (sleep apnea) Electrophysiologist:  None   Referring MD: [name], MD   Chief Complaint: follow-up of CHF with chronic dyspnea  History of Present Illness:    Nicole Gibbs is a 75 y.o. 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 [name] and presents today for follow-up.  Patient was referred to Dr. [name] in 07/2012 for pre-op evaluation and shortness of breath. During work-up she was found to have bilateral DVTs and multiple PEs. She was admitted and placed on Lovenox and Coumadin. Echo at that time showed normal LV systolic function and mild RV dysfunction. A follow-up chest CTA showed complete resolution of PE but did have an incidentally noted multinodular goiter. Follow-up lower extremity dopplers showed resolution of DVTs. She continued to have shortness of breath. Echo in 06/2015 showed LVEF of 35-40% with moderate MR. Myoview in 07/2015 showed a large fixed anteroseptal, apical, and inferolateral perfusion defects suggestive of scar with no reversible ischemia. She ended up undergoing cardiac catheterization in 08/2015 which showed normal coronaries. Most recent Echo in 05/2019 showed improvement in LVEF to 55% with grade 1 diastolic dysfunction. Only trivial MR noted after improvement in EF.  Patient was last seen by Dr. [name] in 10/2019 at which time she continued to complain of shortness of breth and also reported 10lb weight gain over the last 6 months. Dyspnea felt to be multifactorial with obesity and deconditioning  contributing. She was started on CPAP for sleep apnea in 12/2019 and is following with Dr. [facility] for this.   Patient presents today for follow-up. ***  Chronic Combined CHF/Non-Ischemic Cardiomyopathy - LVEF 35-40% in 2017 but improved to 55% on most recent Echo in 05/2019. LHC in 2017 showed normal coronaries.  - Continues to have dyspnea on exertion but this has been felt to be multifactorial with obesity and deconditioning contributing. *** - Appears euvolemic on exam. *** - Continue Lasix 40mg  daily as needed for edema. *** - Continue Lisinopril 5mg  daily.  - Continue Toprol-XL 100mg  daily.  - Continue daily weights and sodium/fluid restrictions. ***  Hypertension - *** - Continue Lisinopril and Toprol-XL as above.  History of Bilateral DVT/PEs - Diagnosed in 2014. Repeat imaging showed complete resolution. - Has been maintained on long-term anticoagulation with Coumadin. Continue to tolerate well - no abnormal bleeding. *** Followed in our Coumadin clinic.   Obstructive Sleep Apnea - Continue CPAP. - Followed by Dr. [facility].        Past Medical History:  Diagnosis Date  . Anxiety   . Arthritis    knees  . Chronic combined systolic and diastolic CHF (congestive heart failure) ([facility])    a. 07/2012 Echo: EF 55-60%, Gr1 DD;  b. 06/2015 Echo: EF 35-40%, triv AI, Mod MR, mod dil LA, mildly reduced RV.  Marland Kitchen Depression   . DVT, bilateral lower limbs ([place])   . H/O cardiovascular stress test    a. 07/2012 MV: fixed mid-dist anterior and apical defect - scar vs attenuation, distal ant HK, no reversibility, EF 51%-->low risk.  Marland Kitchen Hx of blood  clots   . Hypertensive heart disease   . Hypothyroidism   . Joint pain   . Nocturia   . Normal coronary arteries    by cardiac catheterization 09/03/15  . Obese   . OSA on CPAP   . Postmenopausal vaginal bleeding   . Pulmonary embolism, bilateral ([facility])    a. chronic coumadin.  . Rash    under breasts  . Skin cancer   . Sleep apnea   . SOB  (shortness of breath)   . Swelling of both lower extremities   . Thyroid nodule    no meds    Past Surgical History:  Procedure Laterality Date  . CARDIAC CATHETERIZATION N/A 09/03/2015   Procedure: Left Heart Cath and Coronary Angiography;  Surgeon: [name], MD;  Location: [facility];  Service: Cardiovascular;  Laterality: N/A;  . CARDIOVASCULAR STRESS TEST  08-07-2012  DR HILTY/ DR BERRY   LOW RISK NUCLEAR STUDY/ NO ISCHEMIA/ FIXED MID TO DISTAL ANTERIOR AND APICAL DEFECT MAY REPRESENT SCAR VS BREAST ATTENUATION ARTIFACT/  MILD DISTAL ANTERIOR HYPOKINESIS/ EF 51%  . CESAREAN SECTION  1992  . COLONOSCOPY N/A 05/08/2015   Procedure: COLONOSCOPY;  Surgeon: Laurence Spates, MD;  Location[facility];  Service: Endoscopy;  Laterality: N/A;  . DILATION AND CURETTAGE OF UTERUS  06/20/2012   Procedure: DILATATION AND CURETTAGE;  Surgeon: [name], MD;  Location: [facility];  Service: Gynecology;  Laterality: N/A;  . dilation and evacution  1988   missed ab  . DILITATION & CURRETTAGE/HYSTROSCOPY WITH THERMACHOICE ABLATION N/A 09/04/2012   Procedure: DILATATION & CURETTAGE WITH THERMACHOICE ABLATION;  Surgeon: [name], MD;  Location: [facility];  Service: Gynecology;  Laterality: N/A;  . ESOPHAGOGASTRODUODENOSCOPY N/A 05/07/2015   Procedure: ESOPHAGOGASTRODUODENOSCOPY (EGD);  Surgeon: Laurence Spates, MD;  Location: [facility]COPY;  Service: Endoscopy;  Laterality: N/A;  . HYSTEROSCOPY WITH D & C  03-24-2009  . TRANSTHORACIC ECHOCARDIOGRAM  08-08-2012   LVSF NORMAL/ EF 23-53%/  GRADE I DIASTOLIC DYSFUNCTION/ MILD AV AND MV REGURG./  RVSF MILDLY REDUCED    Current Medications: No outpatient medications have been marked as taking for the 06/01/20 encounter (Appointment) with [name], PA-C.     Allergies:   Keflex [cephalexin] and Sulfa antibiotics   Social History   Socioeconomic History  . Marital status: Widowed    Spouse name: Not on  file  . Number of children: Not on file  . Years of education: Not on file  . Highest education level: Not on file  Occupational History  . Occupation: retired  Tobacco Use  . Smoking status: Never Smoker  . Smokeless tobacco: Never Used  Substance and Sexual Activity  . Alcohol use: No  . Drug use: No  . Sexual activity: Not on file  Other Topics Concern  . Not on file  Social History Narrative  . Not on file   Social Determinants of Health   Financial Resource Strain: Not on file  Food Insecurity: Not on file  Transportation Needs: Not on file  Physical Activity: Not on file  Stress: Not on file  Social Connections: Not on file     Family History: The patient's ***family history includes Coronary artery disease (age of onset: 54) in her father; Diabetes in her father; Heart disease in her father; Hypertension in her mother; Mental illness in her mother; Stroke in her father; Sudden death in her sister; Thyroid disease in her mother.  ROS:  Please see the history of present illness.    *** All other systems reviewed and are negative.  EKGs/Labs/Other Studies Reviewed:    The following studies were reviewed today:  Cardiac Catheterization 09/03/2015: Impressions: Ms. Masterman has a nonischemic cardiomyopathy with normal coronary arteries and moderate global LV dysfunction.Continue medical therapy will be recommended. The sheath was removed and a TR band was placed on the right wrist which is patent hemostasis. She will need Lovenox bridging back to Coumadin anticoagulation. She will be discharged home today as an outpatient and I will see her back in 2 weeks. _______________  Echocardiogram 06/10/2019: Impressions: 1. Left ventricular ejection fraction, by visual estimation, is 55%. The  left ventricle has normal function. There is mildly increased left  ventricular hypertrophy.  2. Left ventricular diastolic parameters are consistent with Grade I  diastolic dysfunction  (impaired relaxation).  3. The left ventricle has no regional wall motion abnormalities.  4. Global right ventricle has normal systolic function.The right  ventricular size is normal. No increase in right ventricular wall  thickness.  5. Left atrial size was mild-moderately dilated.  6. Right atrial size was normal.  7. Mild mitral annular calcification.  8. The mitral valve is normal in structure. Trivial mitral valve  regurgitation. No evidence of mitral stenosis.  9. The tricuspid valve is normal in structure. Tricuspid valve  regurgitation is not demonstrated.  10. The aortic valve is tricuspid. Aortic valve regurgitation is mild.  Mild aortic valve sclerosis without stenosis.  11. The inferior vena cava is normal in size with greater than 50%  respiratory variability, suggesting right atrial pressure of 3 mmHg.  12. TR signal is inadequate for assessing pulmonary artery systolic  pressure.   EKG:  EKG *** ordered today. EKG personally reviewed and demonstrates ***.  Recent Labs: 12/16/2019: TSH 0.75 04/01/2020: ALT 16; BUN 14; Creatinine, Ser 0.75; Potassium 4.2; Sodium 139  Recent Lipid Panel    Component Value Date/Time   CHOL 154 12/16/2019 0000   TRIG 90 12/16/2019 0000   HDL 52 12/16/2019 0000   LDLCALC 84 12/16/2019 0000    Physical Exam:    Vital Signs: There were no vitals taken for this visit.    Wt Readings from Last 3 Encounters:  05/05/20 228 lb (103.4 kg)  04/20/20 229 lb (103.9 kg)  04/01/20 230 lb (104.3 kg)     General: 75 y.o. female in no acute distress. HEENT: Normocephalic and atraumatic. Sclera clear. EOMs intact. Neck: Supple. No carotid bruits. No JVD. Heart: *** RRR. Distinct S1 and S2. No murmurs, gallops, or rubs. Radial and distal pedal pulses 2+ and equal bilaterally. Lungs: No increased work of breathing. Clear to ausculation bilaterally. No wheezes, rhonchi, or rales.  Abdomen: Soft, non-distended, and non-tender to palpation.  Bowel sounds present in all 4 quadrants.  MSK: Normal strength and tone for age. *** Extremities: No lower extremity edema.    Skin: Warm and dry. Neuro: Alert and oriented x3. No focal deficits. Psych: Normal affect. Responds appropriately.   Assessment:    No diagnosis found.  Plan:     Disposition: Follow up in ***   Medication Adjustments/Labs and Tests Ordered: Current medicines are reviewed at length with the patient today.  Concerns regarding medicines are outlined above.  No orders of the defined types were placed in this encounter.  No orders of the defined types were placed in this encounter.   There are no Patient Instructions on file for this visit.  Signed, [name], PA-C  05/24/2020 1:18 PM    [facility]

## 2020-05-25 ENCOUNTER — Ambulatory Visit (INDEPENDENT_AMBULATORY_CARE_PROVIDER_SITE_OTHER): Payer: Medicare Other | Admitting: Adult Health

## 2020-05-25 ENCOUNTER — Encounter (INDEPENDENT_AMBULATORY_CARE_PROVIDER_SITE_OTHER): Payer: Self-pay | Admitting: Adult Health

## 2020-05-25 ENCOUNTER — Other Ambulatory Visit: Payer: Self-pay

## 2020-05-25 VITALS — BP 146/81 | HR 79 | Temp 98.0°F | Ht 61.0 in | Wt 231.0 lb

## 2020-05-25 DIAGNOSIS — F32A Depression, unspecified: Secondary | ICD-10-CM | POA: Diagnosis not present

## 2020-05-25 DIAGNOSIS — F419 Anxiety disorder, unspecified: Secondary | ICD-10-CM | POA: Diagnosis not present

## 2020-05-25 DIAGNOSIS — R7303 Prediabetes: Secondary | ICD-10-CM | POA: Diagnosis not present

## 2020-05-25 DIAGNOSIS — Z6841 Body Mass Index (BMI) 40.0 and over, adult: Secondary | ICD-10-CM | POA: Diagnosis not present

## 2020-05-26 ENCOUNTER — Other Ambulatory Visit: Payer: Self-pay | Admitting: Cardiovascular Disease

## 2020-05-28 NOTE — Progress Notes (Signed)
Chief Complaint:   OBESITY Nicole Gibbs is here to discuss her progress with her obesity treatment plan along with follow-up of her obesity related diagnoses. Nicole Gibbs is on the Category 2 Plan and states she is following her eating plan approximately 50% of the time. Nicole Gibbs states she is exercising 0 minutes 0 times per week.  Today's visit was #: 9 Starting weight: 237 lbs Starting date: 01/07/2020 Today's weight: 231 lbs Today's date: 05/25/2020 Total lbs lost to date: 6 lbs Total lbs lost since last in-office visit: 0  Interim History: Nicole Gibbs states, "I want to feel better. I want to move better" in 2022. Due to imbalance, she feels unsafe walking long distances. She will occasionally use banded system for exercise and feels safe participating in seated exercise movements. She is disappointed to have gained 3 lbs over the holiday.  Subjective:   1. Pre-diabetes 04/01/2020 Elevated Insulin and A1c of 6.0. Nicole Gibbs is not on Metformin.  Lab Results  Component Value Date   HGBA1C 6.0 (H) 04/01/2020   Lab Results  Component Value Date   INSULIN 13.8 04/01/2020    2. Anxiety and depression Nicole Gibbs is titrated off Wellbutrin XL per taper schedule. Dizziness has resolved. She reports stable mood and denies suicidal and homicidal ideations. Her last dose was approximately 05/18/2020.   Assessment/Plan:   1. Pre-diabetes Nicole Gibbs will continue to work on weight loss, exercise, and decreasing simple carbohydrates to help decrease the risk of diabetes. Increase protein intake.  2. Anxiety and depression Remain off Wellbutrin. Schedule initial appointment with [facility], as referral was placed at last OV.    3. Class 3 severe obesity with serious comorbidity and body mass index (BMI) of 40.0 to 44.9 in adult, unspecified obesity type [facility]) Nicole Gibbs is currently in the action stage of change. As such, her goal is to continue with weight loss efforts. She has agreed to the Category 2 Plan + 100  calories.   Add 100 calories- 300 snack calories per day (RMR 2079).  1. Measure creamer and an account for snack calories for day. 2. Egg sandwich for breakfast  Exercise goals: Banded exercises as tolerated  Behavioral modification strategies: increasing lean protein intake, decreasing simple carbohydrates, meal planning and cooking strategies, better snacking choices and planning for success.  Nicole Gibbs has agreed to follow-up with our clinic in 2 weeks. She was informed of the importance of frequent follow-up visits to maximize her success with intensive lifestyle modifications for her multiple health conditions.   Objective:   Blood pressure (!) 146/81, pulse 79, temperature 98 F (36.7 C), temperature source Oral, height 5\' 1"  (1.549 m), weight 231 lb (104.8 kg), SpO2 97 %. Body mass index is 43.65 kg/m.  General: Cooperative, alert, well developed, in no acute distress. HEENT: Conjunctivae and lids unremarkable. Cardiovascular: Regular rhythm.  Lungs: Normal work of breathing. Neurologic: No focal deficits.   Lab Results  Component Value Date   CREATININE 0.75 04/01/2020   BUN 14 04/01/2020   NA 139 04/01/2020   K 4.2 04/01/2020   CL 100 04/01/2020   CO2 25 04/01/2020   Lab Results  Component Value Date   ALT 16 04/01/2020   AST 19 04/01/2020   ALKPHOS 73 04/01/2020   BILITOT 0.5 04/01/2020   Lab Results  Component Value Date   HGBA1C 6.0 (H) 04/01/2020   HGBA1C 6.0 12/16/2019   Lab Results  Component Value Date   INSULIN 13.8 04/01/2020   Lab Results  Component Value  Date   TSH 0.75 12/16/2019   Lab Results  Component Value Date   CHOL 154 12/16/2019   HDL 52 12/16/2019   LDLCALC 84 12/16/2019   TRIG 90 12/16/2019   Lab Results  Component Value Date   WBC 7.1 05/21/2019   HGB 15.8 05/21/2019   HCT 50 (A) 05/21/2019   MCV 91.0 01/23/2019   PLT 332 05/21/2019   Lab Results  Component Value Date   IRON 56 10/06/2015   TIBC 244 10/06/2015    FERRITIN 123 10/06/2015   Attestation Statements:   Reviewed by clinician on day of visit: allergies, medications, problem list, medical history, surgical history, family history, social history, and previous encounter notes.  Time spent on visit including pre-visit chart review and post-visit care and charting was 35 minutes.   Coral Ceo, am acting as Location manager for Mina Marble, NP.  I have reviewed the above documentation for accuracy and completeness, and I agree with the above. -  Gianni Mihalik d. Nicollette Wilhelmi, NP-C

## 2020-06-01 ENCOUNTER — Ambulatory Visit: Payer: Medicare Other | Admitting: Student

## 2020-06-01 NOTE — Progress Notes (Signed)
Cardiology Office Note:    Date:  06/15/2020   ID:  Nicole Gibbs, DOB Feb 14, 1946, MRN [id number]  PCP:  [name], MD  Cardiologist:  Quay Burow, MD  Electrophysiologist:  None   Referring MD: [name], MD   Chief Complaint: follow-up of CHF with chronic dyspnea  History of Present Illness:    Nicole Gibbs is a 75 y.o. 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 [name] and presents today for follow-up.  Patient was referred to Dr. [name] in 07/2012 for pre-op evaluation and shortness of breath. During work-up she was found to have bilateral DVTs and multiple PEs. She was admitted and placed on Lovenox and Coumadin. Echo at that time showed normal LV systolic function and mild RV dysfunction. A follow-up chest CTA showed complete resolution of PE but did have an incidentally noted multinodular goiter. Follow-up lower extremity dopplers showed resolution of DVTs. She continued to have shortness of breath. Echo in 06/2015 showed LVEF of 35-40% with moderate MR. Myoview in 07/2015 showed a large fixed anteroseptal, apical, and inferolateral perfusion defects suggestive of scar with no reversible ischemia. She ended up undergoing cardiac catheterization in 08/2015 which showed normal coronaries. Most recent Echo in 05/2019 showed improvement in LVEF to 55% with grade 1 diastolic dysfunction. Only trivial MR noted after improvement in EF.  Patient was last seen by Dr. [name] in 10/2019 at which time she continued to complain of shortness of breth and also reported 10lb weight gain over the last 6 months. Dyspnea felt to be multifactorial with obesity and deconditioning contributing. She was started on CPAP for  sleep apnea in 12/2019 and is following with Dr. Claiborne Billings for this.   Patient presents today for follow-up. Here alone. She is doing relatively well from a cardiac standpoint. She has been going to the [facility] [place] since 01/2020 and states she has gradually been losing weight. However, the last time she went after Christmas her weight was up 3 lbs. She does not weigh herself daily at home. She continues to have chronic shortness of breath with activities such as climbing a flight of steps or walking long distances but this is stable. No orthopnea or PND. She does have chronic lower extremity but this is also stable. Currently her left ankle is slightly more swollen than usual. She also reports some episodes of dizziness where it feels like her surrounding are spinning when she goes to lay down at night. This is worse when she lays on one side and resolves after she repositions herself. No syncope. This sounds like vertigo to me. She also reports that she just is "not sleeping." This is not a new problems. She is not currently using CPAP machine. She states she is supposed to go to the Orthodontist sometime to get refitted for a mask.   She is on chronic Coumadin. She reports a few episodes of vaginal bleeding recently. She does have a history of postmenopausal bleeding and has apparently undergone D&C and ablation in the past. No abnormal bleeding in urine or stools.  Past Medical History:  Diagnosis Date  . Anxiety   . Arthritis    knees  . Chronic combined systolic and diastolic CHF (congestive heart failure) ([place])  a. 07/2012 Echo: EF 55-60%, Gr1 DD;  b. 06/2015 Echo: EF 35-40%, triv AI, Mod MR, mod dil LA, mildly reduced RV.  Marland Kitchen Depression   . DVT, bilateral lower limbs ([place])   . H/O cardiovascular stress test    a. 07/2012 MV: fixed mid-dist anterior and apical defect - scar vs attenuation, distal ant HK, no reversibility, EF 51%-->low risk.  Marland Kitchen Hx of blood clots   . Hypertensive heart  disease   . Hypothyroidism   . Joint pain   . Nocturia   . Normal coronary arteries    by cardiac catheterization 09/03/15  . Obese   . OSA on CPAP   . Postmenopausal vaginal bleeding   . Pulmonary embolism, bilateral ([facility])    a. chronic coumadin.  . Rash    under breasts  . Skin cancer   . Sleep apnea   . SOB (shortness of breath)   . Swelling of both lower extremities   . Thyroid nodule    no meds    Past Surgical History:  Procedure Laterality Date  . CARDIAC CATHETERIZATION N/A 09/03/2015   Procedure: Left Heart Cath and Coronary Angiography;  Surgeon: [name], MD;  Location: [facility];  Service: Cardiovascular;  Laterality: N/A;  . CARDIOVASCULAR STRESS TEST  08-07-2012  DR HILTY/ DR BERRY   LOW RISK NUCLEAR STUDY/ NO ISCHEMIA/ FIXED MID TO DISTAL ANTERIOR AND APICAL DEFECT MAY REPRESENT SCAR VS BREAST ATTENUATION ARTIFACT/  MILD DISTAL ANTERIOR HYPOKINESIS/ EF 51%  . CESAREAN SECTION  1992  . COLONOSCOPY N/A 05/08/2015   Procedure: COLONOSCOPY;  Surgeon: Laurence Spates, MD;  Location: [facility];  Service: Endoscopy;  Laterality: N/A;  . DILATION AND CURETTAGE OF UTERUS  06/20/2012   Procedure: DILATATION AND CURETTAGE;  Surgeon: [name], MD;  Location: [facility];  Service: Gynecology;  Laterality: N/A;  . dilation and evacution  1988   missed ab  . DILITATION & CURRETTAGE/HYSTROSCOPY WITH THERMACHOICE ABLATION N/A 09/04/2012   Procedure: DILATATION & CURETTAGE WITH THERMACHOICE ABLATION;  Surgeon: [name], MD;  Location: [facility];  Service: Gynecology;  Laterality: N/A;  . ESOPHAGOGASTRODUODENOSCOPY N/A 05/07/2015   Procedure: ESOPHAGOGASTRODUODENOSCOPY (EGD);  Surgeon: Laurence Spates, MD;  Location: [facility]OSCOPY;  Service: Endoscopy;  Laterality: N/A;  . HYSTEROSCOPY WITH D & C  03-24-2009  . TRANSTHORACIC ECHOCARDIOGRAM  08-08-2012   LVSF NORMAL/ EF 41-74%/  GRADE I DIASTOLIC DYSFUNCTION/ MILD AV AND MV REGURG./  RVSF  MILDLY REDUCED    Current Medications: Current Meds  Medication Sig  . acetaminophen (TYLENOL) 325 MG tablet Take 650 mg by mouth every 6 (six) hours as needed for moderate pain.  Marland Kitchen buPROPion (WELLBUTRIN XL) 150 MG 24 hr tablet Take 150 mg by mouth daily.  . eszopiclone (LUNESTA) 2 MG TABS tablet Take 1 tablet (2 mg total) by mouth at bedtime as needed for sleep. Take immediately before bedtime  . methimazole (TAPAZOLE) 5 MG tablet Take 5 mg by mouth daily.  . metoprolol succinate (TOPROL XL) 25 MG 24 hr tablet Take 1 tablet (25 mg total) by mouth daily.  . nitroGLYCERIN (NITROSTAT) 0.4 MG SL tablet Place 1 tablet (0.4 mg total) under the tongue every 5 (five) minutes as needed for chest pain.  . progesterone (PROMETRIUM) 200 MG capsule Take 200 mg by mouth at bedtime.   . [DISCONTINUED] furosemide (LASIX) 40 MG tablet Take 1 tablet (40 mg total) by mouth as needed for fluid or edema.  . [  DISCONTINUED] warfarin (COUMADIN) 3 MG tablet TAKE 1 TO 1 & 1/2 (ONE & ONE-HALF) TABLETS BY MOUTH ONCE DAILY AS DIRECTED     Allergies:   Keflex [cephalexin] and Sulfa antibiotics   Social History   Socioeconomic History  . Marital status: Widowed    Spouse name: Not on file  . Number of children: Not on file  . Years of education: Not on file  . Highest education level: Not on file  Occupational History  . Occupation: retired  Tobacco Use  . Smoking status: Never Smoker  . Smokeless tobacco: Never Used  Substance and Sexual Activity  . Alcohol use: No  . Drug use: No  . Sexual activity: Not on file  Other Topics Concern  . Not on file  Social History Narrative  . Not on file   Social Determinants of Health   Financial Resource Strain: Not on file  Food Insecurity: Not on file  Transportation Needs: Not on file  Physical Activity: Not on file  Stress: Not on file  Social Connections: Not on file     Family History: The patient's family history includes Coronary artery disease (age  of onset: 24) in her father; Diabetes in her father; Heart disease in her father; Hypertension in her mother; Mental illness in her mother; Stroke in her father; Sudden death in her sister; Thyroid disease in her mother.  ROS:   Please see the history of present illness.     EKGs/Labs/Other Studies Reviewed:    The following studies were reviewed today:  Cardiac Catheterization 09/03/2015: Impressions: Ms. Schaan has a nonischemic cardiomyopathy with normal coronary arteries and moderate global LV dysfunction.Continue medical therapy will be recommended. The sheath was removed and a TR band was placed on the right wrist which is patent hemostasis. She will need Lovenox bridging back to Coumadin anticoagulation. She will be discharged home today as an outpatient and I will see her back in 2 weeks. _______________  Echocardiogram 06/10/2019: Impressions: 1. Left ventricular ejection fraction, by visual estimation, is 55%. The  left ventricle has normal function. There is mildly increased left  ventricular hypertrophy.  2. Left ventricular diastolic parameters are consistent with Grade I  diastolic dysfunction (impaired relaxation).  3. The left ventricle has no regional wall motion abnormalities.  4. Global right ventricle has normal systolic function.The right  ventricular size is normal. No increase in right ventricular wall  thickness.  5. Left atrial size was mild-moderately dilated.  6. Right atrial size was normal.  7. Mild mitral annular calcification.  8. The mitral valve is normal in structure. Trivial mitral valve  regurgitation. No evidence of mitral stenosis.  9. The tricuspid valve is normal in structure. Tricuspid valve  regurgitation is not demonstrated.  10. The aortic valve is tricuspid. Aortic valve regurgitation is mild.  Mild aortic valve sclerosis without stenosis.  11. The inferior vena cava is normal in size with greater than 50%  respiratory variability,  suggesting right atrial pressure of 3 mmHg.  12. TR signal is inadequate for assessing pulmonary artery systolic  pressure.   EKG:  EKG not ordered today.  Recent Labs: 12/16/2019: TSH 0.75 04/01/2020: ALT 16; BUN 14; Creatinine, Ser 0.75; Potassium 4.2; Sodium 139  Recent Lipid Panel    Component Value Date/Time   CHOL 154 12/16/2019 0000   TRIG 90 12/16/2019 0000   HDL 52 12/16/2019 0000   LDLCALC 84 12/16/2019 0000    Physical Exam:    Vital Signs: BP 126/72  Pulse 74   Ht 5\' 1"  (1.549 m)   Wt 231 lb (104.8 kg)   SpO2 96%   BMI 43.65 kg/m     Wt Readings from Last 3 Encounters:  06/15/20 231 lb (104.8 kg)  05/25/20 231 lb (104.8 kg)  05/05/20 228 lb (103.4 kg)     General: 75 y.o. female in no acute distress. HEENT: Normocephalic and atraumatic. Sclera clear.  Neck: Supple. No JVD. Heart: RRR. Distinct S1 and S2. No murmurs, gallops, or rubs. Radial pulses 2+ and equal bilaterally. Lungs: No increased work of breathing. Clear to ausculation bilaterally. No wheezes, rhonchi, or rales.  Abdomen: Soft, non-distended, and non-tender to palpation.   MSK: Ambulates with a cane. Extremities: Mild lower extremity edema bilaterally. Skin: Warm and dry. Neuro: Alert and oriented x3. No focal deficits. Psych: Normal affect. Responds appropriately.   Assessment:    1. Chronic combined systolic and diastolic CHF (congestive heart failure) ([place])   2. Primary hypertension   3. History of DVT (deep vein thrombosis)   4. History of pulmonary embolus (PE)   5. Obstructive sleep apnea   6. Post-menopausal bleeding     Plan:    Chronic Combined CHF/Non-Ischemic Cardiomyopathy - LVEF 35-40% in 2017 but improved to 55% on most recent Echo in 05/2019. LHC in 2017 showed normal coronaries.  - Stable with chronic dyspnea on exertion. Appears euvolemic on exam.  - Continue Lasix 40mg  daily as needed for weight gain (3lbs in 1 day or 5lbs in 1 week) and edema. Will provide refill  at patient's request. - Continue Lisinopril 5mg  daily.  - Toprol-XL 100mg  daily listed under home medications but patient states she has not taken this in about 2 years. Given history of reduced EF, will restart at 20mg  daily.  - Continue daily weights and sodium/fluid restrictions.   Hypertension - BP well controlled.  - Continue Lisinopril and Toprol-XL as above.  History of Bilateral DVT/PEs - Diagnosed in 2014. Repeat imaging showed complete resolution. - Has been maintained on long-term anticoagulation with Coumadin. Followed in our Coumadin clinic. INR 2.6. Will provide refill of Coumadin at patient's request.   Obstructive Sleep Apnea - Not currently on CPAP. Sounds like she is in the process of being fitted for a different mask. - Will help arrange follow-up with Dr. Claiborne Billings in his sleep clinic.   Sleep Trouble - Patient reports not being able to sleep. This is not a new problems. - Discussed trying Melatonin.  - Recommended talking with PCP.   Dizziness - Patient reports dizziness that she describes as room spinning.  - Sounds like vertigo. Recommended talking with PCP.  Post Menopausal Bleeding - Patient reports a few episodes of vaginal bleeding recently. It does sounds like she has a history of post-menopausal bleeding. She mentions that she has had D&C and ablation in the past. - Will check CBC today. - She has an appointment with her gynecologist in March. Advised calling to see if she needs to be seen sooner.  Disposition: Follow up in 4-6 months with Dr. [name].   Medication Adjustments/Labs and Tests Ordered: Current medicines are reviewed at length with the patient today.  Concerns regarding medicines are outlined above.  Orders Placed This Encounter  Procedures  . CBC   Meds ordered this encounter  Medications  . furosemide (LASIX) 40 MG tablet    Sig: Take 1 tablet (40 mg total) by mouth as needed for fluid or edema.    Dispense:  30 tablet  .  warfarin  (COUMADIN) 3 MG tablet    Sig: TAKE 1 TO 1 & 1/2 (ONE & ONE-HALF) TABLETS BY MOUTH ONCE DAILY AS DIRECTED    Dispense:  120 tablet    Refill:  0  . metoprolol succinate (TOPROL XL) 25 MG 24 hr tablet    Sig: Take 1 tablet (25 mg total) by mouth daily.    Dispense:  90 tablet    Refill:  2    Patient Instructions  Medication Instructions:  Start Metoprolol Succinate 25 mg (1 Tablet Daily). Furosemide 40 mg (1 tablet as needed for swelling. For weight gain 3lbs in a Day and 5lbs in a week) *If you need a refill on your cardiac medications before your next appointment, please call your pharmacy*   Lab Work: CBC If you have labs (blood work) drawn today and your tests are completely normal, you will receive your results only by: Marland Kitchen MyChart Message (if you have MyChart) OR . A paper copy in the mail If you have any lab test that is abnormal or we need to change your treatment, we will call you to review the results.   Testing/Procedures: None   Follow-Up: At [facility], you and your health needs are our priority.  As part of our continuing mission to provide you with exceptional heart care, we have created designated Provider Care Teams.  These Care Teams include your primary Cardiologist (physician) and Advanced Practice Providers (APPs -  Physician Assistants and Nurse Practitioners) who all work together to provide you with the care you need, when you need it.  We recommend signing up for the patient portal called "MyChart".  Sign up information is provided on this After Visit Summary.  MyChart is used to connect with patients for Virtual Visits (Telemedicine).  Patients are able to view lab/test results, encounter notes, upcoming appointments, etc.  Non-urgent messages can be sent to your provider as well.   To learn more about what you can do with MyChart, go to [email or web address].    Your next appointment:   4-6  month(s)  The format for your next appointment:   In  Person  Provider:   Quay Burow, MD        Signed, [name], PA-C  06/15/2020 7:31 PM    [place]

## 2020-06-08 ENCOUNTER — Ambulatory Visit (INDEPENDENT_AMBULATORY_CARE_PROVIDER_SITE_OTHER): Payer: Medicare Other | Admitting: Adult Health

## 2020-06-15 ENCOUNTER — Ambulatory Visit (INDEPENDENT_AMBULATORY_CARE_PROVIDER_SITE_OTHER): Payer: Medicare Other

## 2020-06-15 ENCOUNTER — Ambulatory Visit (INDEPENDENT_AMBULATORY_CARE_PROVIDER_SITE_OTHER): Payer: Medicare Other | Admitting: Student

## 2020-06-15 ENCOUNTER — Encounter: Payer: Self-pay | Admitting: Student

## 2020-06-15 ENCOUNTER — Other Ambulatory Visit: Payer: Self-pay

## 2020-06-15 VITALS — BP 126/72 | HR 74 | Ht 61.0 in | Wt 231.0 lb

## 2020-06-15 DIAGNOSIS — N950 Postmenopausal bleeding: Secondary | ICD-10-CM

## 2020-06-15 DIAGNOSIS — I10 Essential (primary) hypertension: Secondary | ICD-10-CM | POA: Diagnosis not present

## 2020-06-15 DIAGNOSIS — G4733 Obstructive sleep apnea (adult) (pediatric): Secondary | ICD-10-CM | POA: Diagnosis not present

## 2020-06-15 DIAGNOSIS — I2699 Other pulmonary embolism without acute cor pulmonale: Secondary | ICD-10-CM | POA: Diagnosis not present

## 2020-06-15 DIAGNOSIS — Z86711 Personal history of pulmonary embolism: Secondary | ICD-10-CM

## 2020-06-15 DIAGNOSIS — I5042 Chronic combined systolic (congestive) and diastolic (congestive) heart failure: Secondary | ICD-10-CM | POA: Diagnosis not present

## 2020-06-15 DIAGNOSIS — D649 Anemia, unspecified: Secondary | ICD-10-CM | POA: Diagnosis not present

## 2020-06-15 DIAGNOSIS — R42 Dizziness and giddiness: Secondary | ICD-10-CM | POA: Diagnosis not present

## 2020-06-15 DIAGNOSIS — Z7901 Long term (current) use of anticoagulants: Secondary | ICD-10-CM

## 2020-06-15 DIAGNOSIS — I82403 Acute embolism and thrombosis of unspecified deep veins of lower extremity, bilateral: Secondary | ICD-10-CM

## 2020-06-15 DIAGNOSIS — Z86718 Personal history of other venous thrombosis and embolism: Secondary | ICD-10-CM

## 2020-06-15 LAB — POCT INR: INR: 2.6 (ref 2.0–3.0)

## 2020-06-15 MED ORDER — METOPROLOL SUCCINATE ER 25 MG PO TB24: 25.0000 mg | ORAL_TABLET | Freq: Every day | ORAL | 2 refills | Status: DC

## 2020-06-15 MED ORDER — FUROSEMIDE 40 MG PO TABS: 40.0000 mg | ORAL_TABLET | ORAL | Status: DC | PRN

## 2020-06-15 MED ORDER — WARFARIN SODIUM 3 MG PO TABS: ORAL_TABLET | ORAL | 0 refills | Status: DC

## 2020-06-15 NOTE — Patient Instructions (Signed)
Continue 1 tablet daily except 1.5 tablets each Monday, Wednesday and Friday.  Repeat INR in 6 weeks    

## 2020-06-15 NOTE — Patient Instructions (Signed)
Medication Instructions:  Start Metoprolol Succinate 25 mg (1 Tablet Daily). Furosemide 40 mg (1 tablet as needed for swelling. For weight gain 3lbs in a Day and 5lbs in a week) *If you need a refill on your cardiac medications before your next appointment, please call your pharmacy*   Lab Work: CBC If you have labs (blood work) drawn today and your tests are completely normal, you will receive your results only by: Marland Kitchen MyChart Message (if you have MyChart) OR . A paper copy in the mail If you have any lab test that is abnormal or we need to change your treatment, we will call you to review the results.   Testing/Procedures: None   Follow-Up: At [facility], you and your health needs are our priority.  As part of our continuing mission to provide you with exceptional heart care, we have created designated Provider Care Teams.  These Care Teams include your primary Cardiologist (physician) and Advanced Practice Providers (APPs -  Physician Assistants and Nurse Practitioners) who all work together to provide you with the care you need, when you need it.  We recommend signing up for the patient portal called "MyChart".  Sign up information is provided on this After Visit Summary.  MyChart is used to connect with patients for Virtual Visits (Telemedicine).  Patients are able to view lab/test results, encounter notes, upcoming appointments, etc.  Non-urgent messages can be sent to your provider as well.   To learn more about what you can do with MyChart, go to [email or web address].    Your next appointment:   4-6  month(s)  The format for your next appointment:   In Person  Provider:   Quay Burow, MD

## 2020-06-16 ENCOUNTER — Other Ambulatory Visit: Payer: Self-pay

## 2020-06-16 ENCOUNTER — Ambulatory Visit (INDEPENDENT_AMBULATORY_CARE_PROVIDER_SITE_OTHER): Payer: Medicare Other | Admitting: Adult Health

## 2020-06-16 ENCOUNTER — Encounter (INDEPENDENT_AMBULATORY_CARE_PROVIDER_SITE_OTHER): Payer: Self-pay | Admitting: Adult Health

## 2020-06-16 VITALS — BP 136/80 | HR 75 | Temp 97.2°F | Ht 61.0 in | Wt 229.0 lb

## 2020-06-16 DIAGNOSIS — Z6841 Body Mass Index (BMI) 40.0 and over, adult: Secondary | ICD-10-CM

## 2020-06-16 DIAGNOSIS — G4733 Obstructive sleep apnea (adult) (pediatric): Secondary | ICD-10-CM | POA: Diagnosis not present

## 2020-06-16 DIAGNOSIS — R600 Localized edema: Secondary | ICD-10-CM | POA: Diagnosis not present

## 2020-06-16 DIAGNOSIS — N950 Postmenopausal bleeding: Secondary | ICD-10-CM

## 2020-06-16 LAB — CBC
Hematocrit: 47 % — ABNORMAL HIGH (ref 34.0–46.6)
Hemoglobin: 15.5 g/dL (ref 11.1–15.9)
MCH: 28.4 pg (ref 26.6–33.0)
MCHC: 33 g/dL (ref 31.5–35.7)
MCV: 86 fL (ref 79–97)
Platelets: 324 x10E3/uL (ref 150–450)
RBC: 5.45 x10E6/uL — ABNORMAL HIGH (ref 3.77–5.28)
RDW: 12.5 % (ref 11.7–15.4)
WBC: 7.2 x10E3/uL (ref 3.4–10.8)

## 2020-06-17 DIAGNOSIS — R600 Localized edema: Secondary | ICD-10-CM | POA: Insufficient documentation

## 2020-06-17 DIAGNOSIS — N950 Postmenopausal bleeding: Secondary | ICD-10-CM | POA: Insufficient documentation

## 2020-06-17 NOTE — Progress Notes (Signed)
Chief Complaint:   OBESITY Nicole Gibbs is here to discuss her progress with her obesity treatment plan along with follow-up of her obesity related diagnoses. Nicole Gibbs is on the Category 2 Plan + 100 calories and states she is following her eating plan approximately 60% of the time. Nicole Gibbs states she is doing  0 minutes 0 times per week.  Today's visit was #: 10 Starting weight: 237 lbs Starting date: 01/07/2020 Today's weight: 229 lbs Today's date: 06/16/2020 Total lbs lost to date: 8 lbs Total lbs lost since last in-office visit: 2 lbs  Interim History: Pt was seen by cardiology on 06/16/2020. Due to history of reduced ejection fraction, she was restarted on Toprol XL 20 mg. Last ECHO 05/2019 EF improved to 55%. Recommended to f/u with Dr. [name] in 6 months. The extra 100 snack calories (300 total/day) is all allocated for breakfast additions, which helps her stay on track the rest of the day. She is much less frustrated with this new pattern. Interval goal: She wants to wear her new XL clothing that she recently ordered. She estimates to need to lose another 10 lbs to fit into XL clothing.  Subjective:   1. Post-menopausal bleeding 06/15/2020 CBC is stable. Pt denies current vaginal bleeding. She experienced vaginal bleeding in December.  2. OSA (obstructive sleep apnea) Pt is unable to tolerate CPAP due to nocturnal urination, 2 times a night. Lunesta 2 mg at bedtime was ineffective. She has been off prescription about 6 weeks. She reports last PO fluid intake around 1400, then a "sip with pills" in the evening.  3. Lower extremity edema Left ankle edema is greater than right ankle edema. Cardiology provided refill of Lasix 40 mg prn edema. Pt estimates to take 3 doses of Lasix a month.   Assessment/Plan:   1. Post-menopausal bleeding Keep follow up with GYN in March 2022.  2. OSA (obstructive sleep apnea) Follow up with Dr. Claiborne Billings ASAP. Practice good sleep hygiene.  3. Lower extremity  edema Limit sodium intake. Elevate lower extremities often. Recommend compression socks. Continue Lasix as directed by cardiology.  4. Class 3 severe obesity with serious comorbidity and body mass index (BMI) of 40.0 to 44.9 in adult, unspecified obesity type [facility]) Nicole Gibbs is currently in the action stage of change. As such, her goal is to continue with weight loss efforts. She has agreed to the Category 2 Plan + 100 calories.   Exercise goals: Increase activity- use cane to do ambulatory exercise  Behavioral modification strategies: increasing lean protein intake, meal planning and cooking strategies and planning for success.  [name] has agreed to follow-up with our clinic in 2 weeks. She was informed of the importance of frequent follow-up visits to maximize her success with intensive lifestyle modifications for her multiple health conditions.   Objective:   Blood pressure 136/80, pulse 75, temperature (!) 97.2 F (36.2 C), height 5\' 1"  (1.549 m), weight 229 lb (103.9 kg), SpO2 97 %. Body mass index is 43.27 kg/m.  General: Cooperative, alert, well developed, in no acute distress. HEENT: Conjunctivae and lids unremarkable. Cardiovascular: Regular rhythm.  Lungs: Normal work of breathing. Neurologic: No focal deficits.   Lab Results  Component Value Date   CREATININE 0.75 04/01/2020   BUN 14 04/01/2020   NA 139 04/01/2020   K 4.2 04/01/2020   CL 100 04/01/2020   CO2 25 04/01/2020   Lab Results  Component Value Date   ALT 16 04/01/2020   AST 19 04/01/2020  ALKPHOS 73 04/01/2020   BILITOT 0.5 04/01/2020   Lab Results  Component Value Date   HGBA1C 6.0 (H) 04/01/2020   HGBA1C 6.0 12/16/2019   Lab Results  Component Value Date   INSULIN 13.8 04/01/2020   Lab Results  Component Value Date   TSH 0.75 12/16/2019   Lab Results  Component Value Date   CHOL 154 12/16/2019   HDL 52 12/16/2019   LDLCALC 84 12/16/2019   TRIG 90 12/16/2019   Lab Results  Component Value  Date   WBC 7.2 06/15/2020   HGB 15.5 06/15/2020   HCT 47.0 (H) 06/15/2020   MCV 86 06/15/2020   PLT 324 06/15/2020   Lab Results  Component Value Date   IRON 56 10/06/2015   TIBC 244 10/06/2015   FERRITIN 123 10/06/2015    Attestation Statements:   Reviewed by clinician on day of visit: allergies, medications, problem list, medical history, surgical history, family history, social history, and previous encounter notes.  Time spent on visit including pre-visit chart review and post-visit care and charting was 36 minutes.   Coral Ceo, am acting as Location manager for Mina Marble, NP.  I have reviewed the above documentation for accuracy and completeness, and I agree with the above. -  Toney Difatta d. Kimberlye Dilger, NP-C

## 2020-06-30 ENCOUNTER — Ambulatory Visit (INDEPENDENT_AMBULATORY_CARE_PROVIDER_SITE_OTHER): Payer: Medicare Other | Admitting: Adult Health

## 2020-06-30 ENCOUNTER — Encounter (INDEPENDENT_AMBULATORY_CARE_PROVIDER_SITE_OTHER): Payer: Self-pay | Admitting: Adult Health

## 2020-06-30 ENCOUNTER — Other Ambulatory Visit: Payer: Self-pay

## 2020-06-30 VITALS — BP 142/72 | HR 80 | Temp 97.7°F | Ht 61.0 in | Wt 230.0 lb

## 2020-06-30 DIAGNOSIS — Z6841 Body Mass Index (BMI) 40.0 and over, adult: Secondary | ICD-10-CM | POA: Diagnosis not present

## 2020-06-30 DIAGNOSIS — R7303 Prediabetes: Secondary | ICD-10-CM

## 2020-06-30 DIAGNOSIS — G4709 Other insomnia: Secondary | ICD-10-CM

## 2020-06-30 DIAGNOSIS — I10 Essential (primary) hypertension: Secondary | ICD-10-CM

## 2020-07-01 LAB — COMPREHENSIVE METABOLIC PANEL
ALT: 18 IU/L (ref 0–32)
AST: 21 IU/L (ref 0–40)
Albumin/Globulin Ratio: 1.5 (ref 1.2–2.2)
Albumin: 4.1 g/dL (ref 3.7–4.7)
Alkaline Phosphatase: 83 IU/L (ref 44–121)
BUN/Creatinine Ratio: 17 (ref 12–28)
BUN: 13 mg/dL (ref 8–27)
Bilirubin Total: 0.5 mg/dL (ref 0.0–1.2)
CO2: 21 mmol/L (ref 20–29)
Calcium: 9.4 mg/dL (ref 8.7–10.3)
Chloride: 102 mmol/L (ref 96–106)
Creatinine, Ser: 0.78 mg/dL (ref 0.57–1.00)
GFR calc Af Amer: 88 mL/min/{1.73_m2} (ref >59)
GFR calc non Af Amer: 76 mL/min/{1.73_m2} (ref >59)
Globulin, Total: 2.8 g/dL (ref 1.5–4.5)
Glucose: 93 mg/dL (ref 65–99)
Potassium: 4.2 mmol/L (ref 3.5–5.2)
Sodium: 139 mmol/L (ref 134–144)
Total Protein: 6.9 g/dL (ref 6.0–8.5)

## 2020-07-01 LAB — INSULIN, RANDOM: INSULIN: 17.1 u[IU]/mL (ref 2.6–24.9)

## 2020-07-01 LAB — HEMOGLOBIN A1C
Est. average glucose Bld gHb Est-mCnc: 123 mg/dL
Hgb A1c MFr Bld: 5.9 % — ABNORMAL HIGH (ref 4.8–5.6)

## 2020-07-01 NOTE — Progress Notes (Signed)
Chief Complaint:   OBESITY Ilanna is here to discuss her progress with her obesity treatment plan along with follow-up of her obesity related diagnoses. Daneesha is on the Category 2 Plan + 100 calories and states she is following her eating plan approximately 60% of the time. Joely states she is doing 0 minutes 0 times per week.  Today's visit was #: 11 Starting weight: 237 lbs Starting date: 01/07/2020 Today's weight: 230 lbs Today's date: 06/30/2020 Total lbs lost to date: 7 lbs Total lbs lost since last in-office visit: 0  Interim History: Treniya had a lab appointment with Dr. Balan/Endocrinology on 06/25/2020 but missed the appointment. She reports her next OV with Dr. Chalmers Cater is August 2022.  She has tried [employer] bacon sausage/egg/cheese biscuit, which is 620 calories, 20 g protein, and 40 g fat. Reviewed cal/protein for goals for meal plan.  Subjective:   1. Essential hypertension Aubreanna is on lisinopril 5 mg daily, and metoprolol 25 mg daily.  BP Readings from Last 3 Encounters:  06/30/20 (!) 142/72  06/16/20 136/80  06/15/20 126/72    2. Pre-diabetes Dr. Chalmers [facility]ater at [facility] manages pt. Last A1c check on 12/16/2019 was 6.0. Kiahna is not on Metformin or a GLP-1. Her A1c on 04/01/2020 was 6.0 with an elevated insulin level of 13.8. She missed her lab appt with her Endocrinologist/Dr. Chalmers Cater last week.   3. Other insomnia Camden reports nocturia. She reports 2-5 trips to the bathroom a night. She was unable to tolerate CPAP due to having to travel to the bathroom so frequently. She has been trialed on numerous prescriptions for insomnia and is currently only on OTC Melatonin. She states that it is not effective.  Assessment/Plan:   1. Essential hypertension Niurka is working on healthy weight loss and exercise to improve blood pressure control. We will watch for signs of hypotension as she continues her lifestyle modifications. Check labs today.  -  Comprehensive metabolic panel  2. Pre-diabetes Bodhi will continue to work on weight loss, exercise, and decreasing simple carbohydrates to help decrease the risk of diabetes. Check labs today.  - Comprehensive metabolic panel - Hemoglobin A1c - Insulin, random  3. Other insomnia The problem of recurrent insomnia was discussed. Orders and follow up as documented in patient record. Counseling: Intensive lifestyle modifications are the first line treatment for this issue. We discussed several lifestyle modifications today and she will continue to work on diet, exercise and weight loss efforts. Practice sleep hygiene and follow up with Dr. Claiborne Billings.  Counseling  Limit or avoid alcohol, caffeinated beverages, and cigarettes, especially close to bedtime.   Do not eat a large meal or eat spicy foods right before bedtime. This can lead to digestive discomfort that can make it hard for you to sleep.  Keep a sleep diary to help you and your health care provider figure out what could be causing your insomnia.  . Make your bedroom a dark, comfortable place where it is easy to fall asleep. ? Put up shades or blackout curtains to block light from outside. ? Use a white noise machine to block noise. ? Keep the temperature cool. . Limit screen use before bedtime. This includes: ? Watching TV. ? Using your smartphone, tablet, or computer. . Stick to a routine that includes going to bed and waking up at the same times every day and night. This can help you fall asleep faster. Consider making a quiet activity, such as reading, part of your  nighttime routine. . Try to avoid taking naps during the day so that you sleep better at night. . Get out of bed if you are still awake after 15 minutes of trying to sleep. Keep the lights down, but try reading or doing a quiet activity. When you feel sleepy, go back to bed.   4. Class 3 severe obesity with serious comorbidity and body mass index (BMI) of 40.0 to 44.9 in  adult, unspecified obesity type [facility]) Evania is currently in the action stage of change. As such, her goal is to continue with weight loss efforts. She has agreed to the Category 2 Plan + 100 calories.   Cleo Bars are for snack, not a meal replacement.  Try to follow 10:1 ratio (cal/protein) when choosing meals/snacks.  Exercise goals: No exercise has been prescribed at this time.  Behavioral modification strategies: increasing lean protein intake, decreasing eating out, no skipping meals, meal planning and cooking strategies, better snacking choices and planning for success.  Tegan has agreed to follow-up with our clinic in 2 weeks. She was informed of the importance of frequent follow-up visits to maximize her success with intensive lifestyle modifications for her multiple health conditions.   Jonae was informed we would discuss her lab results at her next visit unless there is a critical issue that needs to be addressed sooner. Devon agreed to keep her next visit at the agreed upon time to discuss these results.  Objective:   Blood pressure (!) 142/72, pulse 80, temperature 97.7 F (36.5 C), height 5\' 1"  (1.549 m), weight 230 lb (104.3 kg), SpO2 98 %. Body mass index is 43.46 kg/m.  General: Cooperative, alert, well developed, in no acute distress. HEENT: Conjunctivae and lids unremarkable. Cardiovascular: Regular rhythm.  Lungs: Normal work of breathing. Neurologic: No focal deficits.   Lab Results  Component Value Date   CREATININE 0.78 06/30/2020   BUN 13 06/30/2020   NA 139 06/30/2020   K 4.2 06/30/2020   CL 102 06/30/2020   CO2 21 06/30/2020   Lab Results  Component Value Date   ALT 18 06/30/2020   AST 21 06/30/2020   ALKPHOS 83 06/30/2020   BILITOT 0.5 06/30/2020   Lab Results  Component Value Date   HGBA1C 5.9 (H) 06/30/2020   HGBA1C 6.0 (H) 04/01/2020   HGBA1C 6.0 12/16/2019   Lab Results  Component Value Date   INSULIN 17.1 06/30/2020   INSULIN 13.8  04/01/2020   Lab Results  Component Value Date   TSH 0.75 12/16/2019   Lab Results  Component Value Date   CHOL 154 12/16/2019   HDL 52 12/16/2019   LDLCALC 84 12/16/2019   TRIG 90 12/16/2019   Lab Results  Component Value Date   WBC 7.2 06/15/2020   HGB 15.5 06/15/2020   HCT 47.0 (H) 06/15/2020   MCV 86 06/15/2020   PLT 324 06/15/2020   Lab Results  Component Value Date   IRON 56 10/06/2015   TIBC 244 10/06/2015   FERRITIN 123 10/06/2015    Obesity Behavioral Intervention:   Approximately 15 minutes were spent on the discussion below.  ASK: We discussed the diagnosis of obesity with Moya today and Khamil agreed to give [place] permission to discuss obesity behavioral modification therapy today.  ASSESS: Myeesha has the diagnosis of obesity and her BMI today is 43.5. Monna is in the action stage of change.   ADVISE: Tanea was educated on the multiple health risks of obesity as well as the benefit  of weight loss to improve her health. She was advised of the need for long term treatment and the importance of lifestyle modifications to improve her current health and to decrease her risk of future health problems.  AGREE: Multiple dietary modification options and treatment options were discussed and Alizee agreed to follow the recommendations documented in the above note.  ARRANGE: Lenia was educated on the importance of frequent visits to treat obesity as outlined per CMS and USPSTF guidelines and agreed to schedule her next follow up appointment today.  Attestation Statements:   Reviewed by clinician on day of visit: allergies, medications, problem list, medical history, surgical history, family history, social history, and previous encounter notes.  Coral Ceo, am acting as Location manager for Mina Marble, NP.  I have reviewed the above documentation for accuracy and completeness, and I agree with the above. -  Jayin Derousse d. Aadin Gaut, NP-C

## 2020-07-13 ENCOUNTER — Telehealth: Payer: Self-pay | Admitting: Cardiovascular Disease

## 2020-07-13 NOTE — Telephone Encounter (Signed)
    *  STAT* If patient is at the pharmacy, call can be transferred to refill team.   1. Which medications need to be refilled? (please list name of each medication and dose if known) eszopiclone (LUNESTA) 2 MG TABS tablet  2. Which pharmacy/location (including street and city if local pharmacy) is medication to be sent to? [place], [place] [address]  3. Do they need a 30 day or 90 day supply? 90 days

## 2020-07-14 ENCOUNTER — Other Ambulatory Visit: Payer: Self-pay

## 2020-07-14 ENCOUNTER — Ambulatory Visit (INDEPENDENT_AMBULATORY_CARE_PROVIDER_SITE_OTHER): Payer: Medicare Other | Admitting: Adult Health

## 2020-07-14 ENCOUNTER — Encounter (INDEPENDENT_AMBULATORY_CARE_PROVIDER_SITE_OTHER): Payer: Self-pay | Admitting: Adult Health

## 2020-07-14 VITALS — BP 148/74 | HR 76 | Temp 97.3°F | Ht 61.0 in | Wt 231.0 lb

## 2020-07-14 DIAGNOSIS — I10 Essential (primary) hypertension: Secondary | ICD-10-CM

## 2020-07-14 DIAGNOSIS — G4709 Other insomnia: Secondary | ICD-10-CM | POA: Diagnosis not present

## 2020-07-14 DIAGNOSIS — R7303 Prediabetes: Secondary | ICD-10-CM

## 2020-07-14 DIAGNOSIS — Z6841 Body Mass Index (BMI) 40.0 and over, adult: Secondary | ICD-10-CM | POA: Diagnosis not present

## 2020-07-15 DIAGNOSIS — Z01419 Encounter for gynecological examination (general) (routine) without abnormal findings: Secondary | ICD-10-CM | POA: Diagnosis not present

## 2020-07-15 DIAGNOSIS — R309 Painful micturition, unspecified: Secondary | ICD-10-CM | POA: Diagnosis not present

## 2020-07-15 DIAGNOSIS — Z1231 Encounter for screening mammogram for malignant neoplasm of breast: Secondary | ICD-10-CM | POA: Diagnosis not present

## 2020-07-15 DIAGNOSIS — Z7689 Persons encountering health services in other specified circumstances: Secondary | ICD-10-CM | POA: Diagnosis not present

## 2020-07-15 DIAGNOSIS — Z6841 Body Mass Index (BMI) 40.0 and over, adult: Secondary | ICD-10-CM | POA: Diagnosis not present

## 2020-07-15 NOTE — Progress Notes (Signed)
Chief Complaint:   OBESITY Melanni is here to discuss her progress with her obesity treatment plan along with follow-up of her obesity related diagnoses. Kiyona is on the Category 2 Plan + 100 calories and states she is following her eating plan approximately 50% of the time. Noely states she is doing 0 minutes 0 times per week.  Today's visit was #: 12 Starting weight: 237 lbs Starting date: 01/07/2020 Today's weight: 231 lbs Today's date: 07/14/2020 Total lbs lost to date: 6 lbs Total lbs lost since last in-office visit: 0  Interim History: Abbagayle continues to experience bothersome insomnia. She called Dr. Evette Georges office for a refill on Lunesta. She has been out since December. She has been using OTC Melatonin 1 mg, 1-3 tablets every night without a quality sleep response.  Subjective:   1. Pre-diabetes Discussed labs with patient today.  Shelly's 06/30/2020 A1c 5.9, which is improved from 6.0 on 04/01/2020. Her blood glucose is stable. Her insulin level is slightly worsened to 17.1, up from 13.8 on 04/01/2020.  Lab Results  Component Value Date   HGBA1C 5.9 (H) 06/30/2020   Lab Results  Component Value Date   INSULIN 17.1 06/30/2020   INSULIN 13.8 04/01/2020    2. Essential hypertension Discussed labs with patient today.  Shamir's 06/30/2020 CMP is stable. Her BP and heart rate are stable at OV.  BP Readings from Last 3 Encounters:  07/14/20 (!) 148/74  06/30/20 (!) 142/72  06/16/20 136/80    3. Other insomnia Kinya recently contacted Dr. Claiborne Billings about a refill on Lunesta. She has been out of [place] since December 2021. She has been using OTC Melatonin 1 mg 1-3 tab at bedtime.  Assessment/Plan:   1. Pre-diabetes Braeden will continue to work on weight loss, exercise, and decreasing simple carbohydrates to help decrease the risk of diabetes. Increase protein intake. Increase seated stretching daily.  2. Essential hypertension Brendolyn is working on healthy weight loss and  exercise to improve blood pressure control. We will watch for signs of hypotension as she continues her lifestyle modifications. Continue lisinopril 5 mg daily, metoprolol succinate 25 mg, and furosemide 40 mg as needed.  3. Other insomnia The problem of recurrent insomnia was discussed. Orders and follow up as documented in patient record. Counseling: Intensive lifestyle modifications are the first line treatment for this issue. We discussed several lifestyle modifications today and she will continue to work on diet, exercise and weight loss efforts. Follow up with Dr. Claiborne Billings ASAP.  Counseling  Limit or avoid alcohol, caffeinated beverages, and cigarettes, especially close to bedtime.   Do not eat a large meal or eat spicy foods right before bedtime. This can lead to digestive discomfort that can make it hard for you to sleep.  Keep a sleep diary to help you and your health care provider figure out what could be causing your insomnia.  . Make your bedroom a dark, comfortable place where it is easy to fall asleep. ? Put up shades or blackout curtains to block light from outside. ? Use a white noise machine to block noise. ? Keep the temperature cool. . Limit screen use before bedtime. This includes: ? Watching TV. ? Using your smartphone, tablet, or computer. . Stick to a routine that includes going to bed and waking up at the same times every day and night. This can help you fall asleep faster. Consider making a quiet activity, such as reading, part of your nighttime routine. . Try to avoid  taking naps during the day so that you sleep better at night. . Get out of bed if you are still awake after 15 minutes of trying to sleep. Keep the lights down, but try reading or doing a quiet activity. When you feel sleepy, go back to bed.   4. Class 3 severe obesity with serious comorbidity and body mass index (BMI) of 40.0 to 44.9 in adult, unspecified obesity type [facility]) Ayshia is currently in the  action stage of change. As such, her goal is to continue with weight loss efforts. She has agreed to the Category 2 Plan + 100 calories.   Increase daily movement. Continue to avoid excessive caffeine use.  Exercise goals: As is- 5 minutes of seated stretches daily  Behavioral modification strategies: increasing lean protein intake, decreasing simple carbohydrates, no skipping meals, meal planning and cooking strategies and planning for success.  Algie has agreed to follow-up with our clinic in 2 weeks. She was informed of the importance of frequent follow-up visits to maximize her success with intensive lifestyle modifications for her multiple health conditions.   Objective:   Blood pressure (!) 148/74, pulse 76, temperature (!) 97.3 F (36.3 C), height 5\' 1"  (1.549 m), weight 231 lb (104.8 kg), SpO2 96 %. Body mass index is 43.65 kg/m.  General: Cooperative, alert, well developed, in no acute distress. HEENT: Conjunctivae and lids unremarkable. Cardiovascular: Regular rhythm.  Lungs: Normal work of breathing. Neurologic: No focal deficits.   Lab Results  Component Value Date   CREATININE 0.78 06/30/2020   BUN 13 06/30/2020   NA 139 06/30/2020   K 4.2 06/30/2020   CL 102 06/30/2020   CO2 21 06/30/2020   Lab Results  Component Value Date   ALT 18 06/30/2020   AST 21 06/30/2020   ALKPHOS 83 06/30/2020   BILITOT 0.5 06/30/2020   Lab Results  Component Value Date   HGBA1C 5.9 (H) 06/30/2020   HGBA1C 6.0 (H) 04/01/2020   HGBA1C 6.0 12/16/2019   Lab Results  Component Value Date   INSULIN 17.1 06/30/2020   INSULIN 13.8 04/01/2020   Lab Results  Component Value Date   TSH 0.75 12/16/2019   Lab Results  Component Value Date   CHOL 154 12/16/2019   HDL 52 12/16/2019   LDLCALC 84 12/16/2019   TRIG 90 12/16/2019   Lab Results  Component Value Date   WBC 7.2 06/15/2020   HGB 15.5 06/15/2020   HCT 47.0 (H) 06/15/2020   MCV 86 06/15/2020   PLT 324 06/15/2020    Lab Results  Component Value Date   IRON 56 10/06/2015   TIBC 244 10/06/2015   FERRITIN 123 10/06/2015   Attestation Statements:   Reviewed by clinician on day of visit: allergies, medications, problem list, medical history, surgical history, family history, social history, and previous encounter notes.  Time spent on visit including pre-visit chart review and post-visit care and charting was 32 minutes.   Coral Ceo, am acting as Location manager for Mina Marble, NP.  I have reviewed the above documentation for accuracy and completeness, and I agree with the above. -  Brodin Gelpi d. Jeremia Groot, NP-C

## 2020-07-17 ENCOUNTER — Telehealth: Payer: Self-pay | Admitting: Cardiovascular Disease

## 2020-07-17 ENCOUNTER — Other Ambulatory Visit: Payer: Self-pay | Admitting: Cardiovascular Disease

## 2020-07-17 ENCOUNTER — Other Ambulatory Visit: Payer: Self-pay

## 2020-07-17 MED ORDER — ESZOPICLONE 2 MG PO TABS: ORAL_TABLET | ORAL | 0 refills | Status: DC

## 2020-07-17 NOTE — Telephone Encounter (Signed)
Per Dr.Kelly okay to refill- called into pharmacy.

## 2020-07-17 NOTE — Telephone Encounter (Signed)
Okay per Dr.Kelly to refill- called into pharmacy gave refills.

## 2020-07-17 NOTE — Telephone Encounter (Signed)
*  STAT* If patient is at the pharmacy, call can be transferred to refill team.   1. Which medications need to be refilled? (please list name of each medication and dose if known)  eszopiclone (LUNESTA) 2 MG TABS tablet  2. Which pharmacy/location (including street and city if local pharmacy) is medication to be sent to?  [place], [place] [address]  3. Do they need a 30 day or 90 day supply?  90 day

## 2020-07-27 ENCOUNTER — Ambulatory Visit (INDEPENDENT_AMBULATORY_CARE_PROVIDER_SITE_OTHER): Payer: Medicare Other

## 2020-07-27 ENCOUNTER — Other Ambulatory Visit: Payer: Self-pay

## 2020-07-27 DIAGNOSIS — I82403 Acute embolism and thrombosis of unspecified deep veins of lower extremity, bilateral: Secondary | ICD-10-CM

## 2020-07-27 DIAGNOSIS — I2699 Other pulmonary embolism without acute cor pulmonale: Secondary | ICD-10-CM

## 2020-07-27 DIAGNOSIS — Z7901 Long term (current) use of anticoagulants: Secondary | ICD-10-CM

## 2020-07-27 LAB — POCT INR: INR: 2.3 (ref 2.0–3.0)

## 2020-07-27 NOTE — Patient Instructions (Signed)
Continue 1 tablet daily except 1.5 tablets each Monday, Wednesday and Friday.  Repeat INR in 6 weeks

## 2020-07-30 ENCOUNTER — Ambulatory Visit (INDEPENDENT_AMBULATORY_CARE_PROVIDER_SITE_OTHER): Payer: Medicare Other | Admitting: Adult Health

## 2020-07-30 ENCOUNTER — Other Ambulatory Visit: Payer: Self-pay

## 2020-07-30 ENCOUNTER — Encounter (INDEPENDENT_AMBULATORY_CARE_PROVIDER_SITE_OTHER): Payer: Self-pay | Admitting: Adult Health

## 2020-07-30 VITALS — BP 160/89 | HR 77 | Temp 97.4°F | Ht 61.0 in | Wt 230.0 lb

## 2020-07-30 DIAGNOSIS — G4709 Other insomnia: Secondary | ICD-10-CM | POA: Diagnosis not present

## 2020-07-30 DIAGNOSIS — F32A Depression, unspecified: Secondary | ICD-10-CM

## 2020-07-30 DIAGNOSIS — I10 Essential (primary) hypertension: Secondary | ICD-10-CM

## 2020-07-30 DIAGNOSIS — Z6841 Body Mass Index (BMI) 40.0 and over, adult: Secondary | ICD-10-CM

## 2020-07-30 DIAGNOSIS — R7303 Prediabetes: Secondary | ICD-10-CM | POA: Diagnosis not present

## 2020-07-30 DIAGNOSIS — F419 Anxiety disorder, unspecified: Secondary | ICD-10-CM

## 2020-08-03 NOTE — Progress Notes (Signed)
Chief Complaint:   OBESITY Nicole Gibbs is here to discuss her progress with her obesity treatment plan along with follow-up of her obesity related diagnoses. Nicole Gibbs is on the Category 2 Plan +100 calories and states she is following her eating plan approximately 70% of the time. Nicole Gibbs states she is doing 0 minutes 0 times per week.  Today's visit was #: 73 Starting weight: 237 lbs Starting date: 01/07/2020 Today's weight: 230 lbs Today's date: 07/30/2020 Total lbs lost to date: 7 lbs Total lbs lost since last in-office visit: 1 lb  Interim History: Nicole Gibbs has been experiencing carb cravings and increased snacking. She will snack 4 out of 7 days of the week. She is not counting snack calories.   Subjective:   1. Essential hypertension Nicole Gibbs's SBP is elevated at OV. She did not take her Lisinopril 5 mg QD, metoprolol succinate XL 25 mg QD. She denies cardiac symptoms.  BP Readings from Last 3 Encounters:  07/30/20 (!) 160/89  07/14/20 (!) 148/74  06/30/20 (!) 142/72    2. Other insomnia Dr. Claiborne Billings refilled Lunesta 2 mg QHS- 90 tabs. Reviewed in PDMP-no aberrancies noted.   3. Pre-diabetes Nicole Gibbs's 06/30/2020 A1c was 5.9 with normal BG at 93 and elevated insulin level at 17.1. She continues to experience carb cravings. She is not interested in adding medication at this time.  Lab Results  Component Value Date   HGBA1C 5.9 (H) 06/30/2020   Lab Results  Component Value Date   INSULIN 17.1 06/30/2020   INSULIN 13.8 04/01/2020    4. Anxiety and depression Nicole Gibbs recently weaned herself of Wellbutrin. She denies suicidal or homicidal ideations. She was referred to Behavioral Health/psychology in December 2021. She has not established with a provider.  Assessment/Plan:   1. Essential hypertension Lamia is working on healthy weight loss and exercise to improve blood pressure control. We will watch for signs of hypotension as she continues her lifestyle modifications. Take  anti-hypertensives as directed and everyday..  2. Other insomnia The problem of recurrent insomnia was discussed. Orders and follow up as documented in patient record. Counseling: Intensive lifestyle modifications are the first line treatment for this issue. We discussed several lifestyle modifications today and she will continue to work on diet, exercise and weight loss efforts. Practice good sleep hygiene. Continue to use Lunesta PRN.  Counseling  Limit or avoid alcohol, caffeinated beverages, and cigarettes, especially close to bedtime.   Do not eat a large meal or eat spicy foods right before bedtime. This can lead to digestive discomfort that can make it hard for you to sleep.  Keep a sleep diary to help you and your health care provider figure out what could be causing your insomnia.  . Make your bedroom a dark, comfortable place where it is easy to fall asleep. ? Put up shades or blackout curtains to block light from outside. ? Use a white noise machine to block noise. ? Keep the temperature cool. . Limit screen use before bedtime. This includes: ? Watching TV. ? Using your smartphone, tablet, or computer. . Stick to a routine that includes going to bed and waking up at the same times every day and night. This can help you fall asleep faster. Consider making a quiet activity, such as reading, part of your nighttime routine. . Try to avoid taking naps during the day so that you sleep better at night. . Get out of bed if you are still awake after 15 minutes of trying to  sleep. Keep the lights down, but try reading or doing a quiet activity. When you feel sleepy, go back to bed.  3. Pre-diabetes Nicole Gibbs will continue to work on weight loss, exercise, and decreasing simple carbohydrates to help decrease the risk of diabetes. Only purchase 1 container of ice cream per month.  4. Anxiety and depression Behavior modification techniques were discussed today to help Nicole Gibbs deal with her  anxiety.  Orders and follow up as documented in patient record.   - Ambulatory referral to Psychology Of Note- this is second referral placed to Psychology- encouraged pt to answer calls from [facility].  5. Class 3 severe obesity with serious comorbidity and body mass index (BMI) of 40.0 to 44.9 in adult, unspecified obesity type [facility]) Nicole Gibbs is currently in the action stage of change. As such, her goal is to continue with weight loss efforts. She has agreed to the Category 2 Plan + 100 calories.   Use Food Scale to measure protein servings Discussed at length how to follow plan and account for snack calories.  Exercise goals: No exercise has been prescribed at this time.  Behavioral modification strategies: increasing lean protein intake, decreasing simple carbohydrates, meal planning and cooking strategies, better snacking choices, emotional eating strategies and planning for success.  Robin has agreed to follow-up with our clinic in 2 weeks. She was informed of the importance of frequent follow-up visits to maximize her success with intensive lifestyle modifications for her multiple health conditions.   Objective:   Blood pressure (!) 160/89, pulse 77, temperature (!) 97.4 F (36.3 C), height 5\' 1"  (1.549 m), weight 230 lb (104.3 kg), SpO2 96 %. Body mass index is 43.46 kg/m.  General: Cooperative, alert, well developed, in no acute distress. HEENT: Conjunctivae and lids unremarkable. Cardiovascular: Regular rhythm.  Lungs: Normal work of breathing. Neurologic: No focal deficits.   Lab Results  Component Value Date   CREATININE 0.78 06/30/2020   BUN 13 06/30/2020   NA 139 06/30/2020   K 4.2 06/30/2020   CL 102 06/30/2020   CO2 21 06/30/2020   Lab Results  Component Value Date   ALT 18 06/30/2020   AST 21 06/30/2020   ALKPHOS 83 06/30/2020   BILITOT 0.5 06/30/2020   Lab Results  Component Value Date   HGBA1C 5.9 (H) 06/30/2020   HGBA1C 6.0 (H) 04/01/2020   HGBA1C  6.0 12/16/2019   Lab Results  Component Value Date   INSULIN 17.1 06/30/2020   INSULIN 13.8 04/01/2020   Lab Results  Component Value Date   TSH 0.75 12/16/2019   Lab Results  Component Value Date   CHOL 154 12/16/2019   HDL 52 12/16/2019   LDLCALC 84 12/16/2019   TRIG 90 12/16/2019   Lab Results  Component Value Date   WBC 7.2 06/15/2020   HGB 15.5 06/15/2020   HCT 47.0 (H) 06/15/2020   MCV 86 06/15/2020   PLT 324 06/15/2020   Lab Results  Component Value Date   IRON 56 10/06/2015   TIBC 244 10/06/2015   FERRITIN 123 10/06/2015    Obesity Behavioral Intervention:   Approximately 15 minutes were spent on the discussion below.  ASK: We discussed the diagnosis of obesity with Klaire today and Latora agreed to give [place] permission to discuss obesity behavioral modification therapy today.  ASSESS: Emsley has the diagnosis of obesity and her BMI today is 43.6. Aricka is in the action stage of change.   ADVISE: Tirsa was educated on the multiple health  risks of obesity as well as the benefit of weight loss to improve her health. She was advised of the need for long term treatment and the importance of lifestyle modifications to improve her current health and to decrease her risk of future health problems.  AGREE: Multiple dietary modification options and treatment options were discussed and Haillie agreed to follow the recommendations documented in the above note.  ARRANGE: Skyah was educated on the importance of frequent visits to treat obesity as outlined per CMS and USPSTF guidelines and agreed to schedule her next follow up appointment today.  Attestation Statements:   Reviewed by clinician on day of visit: allergies, medications, problem list, medical history, surgical history, family history, social history, and previous encounter notes.  Coral Ceo, am acting as Location manager for Mina Marble, NP.  I have reviewed the above documentation for accuracy and  completeness, and I agree with the above. -  Thamara Leger d. Sidonie Dexheimer, NP-C

## 2020-08-17 ENCOUNTER — Encounter (INDEPENDENT_AMBULATORY_CARE_PROVIDER_SITE_OTHER): Payer: Self-pay | Admitting: Adult Health

## 2020-08-17 ENCOUNTER — Ambulatory Visit (INDEPENDENT_AMBULATORY_CARE_PROVIDER_SITE_OTHER): Payer: Medicare Other | Admitting: Adult Health

## 2020-08-17 ENCOUNTER — Other Ambulatory Visit: Payer: Self-pay

## 2020-08-17 VITALS — BP 150/84 | HR 81 | Temp 98.4°F | Ht 61.0 in | Wt 231.0 lb

## 2020-08-17 DIAGNOSIS — R7303 Prediabetes: Secondary | ICD-10-CM | POA: Diagnosis not present

## 2020-08-17 DIAGNOSIS — I10 Essential (primary) hypertension: Secondary | ICD-10-CM

## 2020-08-17 DIAGNOSIS — Z6841 Body Mass Index (BMI) 40.0 and over, adult: Secondary | ICD-10-CM

## 2020-08-18 NOTE — Progress Notes (Signed)
Chief Complaint:   OBESITY Nicole Gibbs is here to discuss her progress with her obesity treatment plan along with follow-up of her obesity related diagnoses. Nicole Gibbs is on the Category 2 Plan and states she is following her eating plan approximately 60% of the time. Nicole Gibbs states she is walking 20 minutes 2 times per week.  Today's visit was #: 14 Starting weight: 237 lbs Starting date: 01/07/2020 Today's weight: 231 lbs Today's date: 08/17/2020 Total lbs lost to date: 6 lbs Total lbs lost since last in-office visit: 0  Interim History: Nicole Gibbs feels that she is following plan 60% of the time. When eating "off plan" she will have bagels and soups.  We discussed changing to journaling plan to afford more flexibility. She prefers to remain on category 2 meal plan.   Subjective:   1. Essential hypertension Nicole Gibbs's systolic BP is slightly elevated at OV today. She is on lisinopril 5 mg QD and metoprolol XL 25 mg QD. She did not take it today prior to OV. She denies cardiac symptoms.  BP Readings from Last 3 Encounters:  08/17/20 (!) 150/84  07/30/20 (!) 160/89  07/14/20 (!) 148/74    2. Pre-diabetes Nicole Gibbs's 06/30/2020 A1c 5.9 with elevated insulin 17.1 and normal BG 93.  She is not on Metformin.  Lab Results  Component Value Date   HGBA1C 5.9 (H) 06/30/2020   Lab Results  Component Value Date   INSULIN 17.1 06/30/2020   INSULIN 13.8 04/01/2020    Assessment/Plan:   1. Essential hypertension Nicole Gibbs is working on healthy weight loss and exercise to improve blood pressure control. We will watch for signs of hypotension as she continues her lifestyle modifications. Continue current anti-hypertensive regimen.  2. Pre-diabetes Nicole Gibbs will continue to work on weight loss, exercise, and decreasing simple carbohydrates to help decrease the risk of diabetes. Increase protein at each meal.  3. Class 3 severe obesity with serious comorbidity and body mass index (BMI) of 40.0 to 44.9 in adult,  unspecified obesity type Nicole Gibbs) Nicole Gibbs is currently in the action stage of change. As such, her goal is to continue with weight loss efforts. She has agreed to the Category 2 Plan + 100 calories.   Keep accurate count of snack calories- either alone or with meals  Exercise goals: As is  Behavioral modification strategies: increasing lean protein intake, meal planning and cooking strategies and planning for success.  Nicole Gibbs has agreed to follow-up with our clinic in 4 weeks. She was informed of the importance of frequent follow-up visits to maximize her success with intensive lifestyle modifications for her multiple health conditions.   Objective:   Blood pressure (!) 150/84, pulse 81, temperature 98.4 F (36.9 C), height 5\' 1"  (1.549 m), weight 231 lb (104.8 kg), SpO2 96 %. Body mass index is 43.65 kg/m.  General: Cooperative, alert, well developed, in no acute distress. HEENT: Conjunctivae and lids unremarkable. Cardiovascular: Regular rhythm.  Lungs: Normal work of breathing. Neurologic: No focal deficits.   Lab Results  Component Value Date   CREATININE 0.78 06/30/2020   BUN 13 06/30/2020   NA 139 06/30/2020   K 4.2 06/30/2020   CL 102 06/30/2020   CO2 21 06/30/2020   Lab Results  Component Value Date   ALT 18 06/30/2020   AST 21 06/30/2020   ALKPHOS 83 06/30/2020   BILITOT 0.5 06/30/2020   Lab Results  Component Value Date   HGBA1C 5.9 (H) 06/30/2020   HGBA1C 6.0 (H) 04/01/2020   HGBA1C  6.0 12/16/2019   Lab Results  Component Value Date   INSULIN 17.1 06/30/2020   INSULIN 13.8 04/01/2020   Lab Results  Component Value Date   TSH 0.75 12/16/2019   Lab Results  Component Value Date   CHOL 154 12/16/2019   HDL 52 12/16/2019   LDLCALC 84 12/16/2019   TRIG 90 12/16/2019   Lab Results  Component Value Date   WBC 7.2 06/15/2020   HGB 15.5 06/15/2020   HCT 47.0 (H) 06/15/2020   MCV 86 06/15/2020   PLT 324 06/15/2020   Lab Results  Component Value Date    IRON 56 10/06/2015   TIBC 244 10/06/2015   FERRITIN 123 10/06/2015     Attestation Statements:   Reviewed by clinician on day of visit: allergies, medications, problem list, medical history, surgical history, family history, social history, and previous encounter notes.  Time spent on visit including pre-visit chart review and post-visit care and charting was 34 minutes.   Coral Ceo, am acting as Location manager for Mina Marble, NP.  I have reviewed the above documentation for accuracy and completeness, and I agree with the above. -  Merric Yost d. Jeanmarc Viernes, NP-C

## 2020-09-01 DIAGNOSIS — H6123 Impacted cerumen, bilateral: Secondary | ICD-10-CM | POA: Diagnosis not present

## 2020-09-01 DIAGNOSIS — F418 Other specified anxiety disorders: Secondary | ICD-10-CM | POA: Diagnosis not present

## 2020-09-01 DIAGNOSIS — G4733 Obstructive sleep apnea (adult) (pediatric): Secondary | ICD-10-CM | POA: Diagnosis not present

## 2020-09-01 DIAGNOSIS — E669 Obesity, unspecified: Secondary | ICD-10-CM | POA: Diagnosis not present

## 2020-09-07 ENCOUNTER — Other Ambulatory Visit: Payer: Self-pay

## 2020-09-07 ENCOUNTER — Ambulatory Visit (INDEPENDENT_AMBULATORY_CARE_PROVIDER_SITE_OTHER): Payer: Medicare Other

## 2020-09-07 DIAGNOSIS — Z7901 Long term (current) use of anticoagulants: Secondary | ICD-10-CM | POA: Diagnosis not present

## 2020-09-07 DIAGNOSIS — I82403 Acute embolism and thrombosis of unspecified deep veins of lower extremity, bilateral: Secondary | ICD-10-CM

## 2020-09-07 DIAGNOSIS — I2699 Other pulmonary embolism without acute cor pulmonale: Secondary | ICD-10-CM

## 2020-09-07 LAB — POCT INR: INR: 2.8 (ref 2.0–3.0)

## 2020-09-07 NOTE — Patient Instructions (Signed)
Continue 1 tablet daily except 1.5 tablets each Monday, Wednesday and Friday.  Repeat INR in 6 weeks    

## 2020-09-08 ENCOUNTER — Other Ambulatory Visit: Payer: Self-pay | Admitting: Cardiovascular Disease

## 2020-09-14 ENCOUNTER — Other Ambulatory Visit: Payer: Self-pay

## 2020-09-14 ENCOUNTER — Ambulatory Visit (INDEPENDENT_AMBULATORY_CARE_PROVIDER_SITE_OTHER): Payer: Medicare Other | Admitting: Adult Health

## 2020-09-14 ENCOUNTER — Encounter (INDEPENDENT_AMBULATORY_CARE_PROVIDER_SITE_OTHER): Payer: Self-pay | Admitting: Adult Health

## 2020-09-14 VITALS — BP 148/81 | HR 81 | Temp 97.5°F | Ht 61.0 in | Wt 236.0 lb

## 2020-09-14 DIAGNOSIS — Z6841 Body Mass Index (BMI) 40.0 and over, adult: Secondary | ICD-10-CM

## 2020-09-14 DIAGNOSIS — R600 Localized edema: Secondary | ICD-10-CM

## 2020-09-14 DIAGNOSIS — I5042 Chronic combined systolic (congestive) and diastolic (congestive) heart failure: Secondary | ICD-10-CM

## 2020-09-14 DIAGNOSIS — I10 Essential (primary) hypertension: Secondary | ICD-10-CM | POA: Diagnosis not present

## 2020-09-15 ENCOUNTER — Other Ambulatory Visit: Payer: Self-pay | Admitting: Cardiovascular Disease

## 2020-09-15 NOTE — Progress Notes (Signed)
Chief Complaint:   OBESITY Nicole Gibbs is here to discuss her progress with her obesity treatment plan along with follow-up of her obesity related diagnoses. Nicole Gibbs is on the Category 2 Plan and states she is following her eating plan approximately 70-75% of the time. Nicole Gibbs states she is not currently exercising.  Today's visit was #: 15 Starting weight: 237 lbs Starting date: 01/07/2020 Today's weight: 236 lbs Today's date: 09/14/2020 Total lbs lost to date: 1 Total lbs lost since last in-office visit: 0  Interim History: Bioimpedance reveals 11 lbs of water gain since last OV.  She is up a total of 5 lbs since last OV- current weight- 236, last OV weight 231- which was 4 weeks ago. Nicole Gibbs denies increase of SOB.  She denies cough. She denies CP/palpitations/dizziness with position changes.  She denies needing additional pillows to sleep at night. She does not use supplemental oxygen.  She is able to safely ambulate with cane. She is experiencing "only slight" increased lower extremity edema L Ankle > R Ankle- which has been her baseline. She rarely takes furosemide 40 mg- last dose was 10 days ago.  She does not like to take diuretic "because I have to stay home all day when I do". She has been adding salt/butter with morning eggs.  Subjective:   1. Lower extremity edema Nicole Gibbs has increased lower extremity edema- L Ankle > R Ankle. She denies increased SOB. Bioimpedance reflects increase of 11 lbs of water weight since last OV- last in office appt was 4 weeks ago. She is up a total of 5 lbs since last OV- current weight- 236, last OV weight 231- which was 4 weeks ago. She estimates to use furosemide 40 mg once every 10 days.  06/30/2020 CMP- potassium 4.2, sodium 139 BP today 150/84, HR 81  2. Essential hypertension Nicole Gibbs's BP is stable at OV. She denies cardiac symptoms. She is on lisinopril 5 mg QD, metoprolol succinate 25 mg QD, and furosemide 40 mg PRN.  She uses diuretic  approximately once every 10 days.  BP Readings from Last 3 Encounters:  09/14/20 (!) 148/81  08/17/20 (!) 150/84  07/30/20 (!) 160/89   Assessment/Plan:   1. Lower extremity edema Use furosemide 40 mg 1/2 tab-recommend taking daily for at least 3 days.  Check BP daily.  Decrease sodium intake.  Elevate legs when able.   Follow up with cardiology if SOB or edema worsens. Pt verbalized understanding and agreement. Stop diuretic if systolic BP is less than 950- pt verbalized understanding/agreement. Recommend daily weighing to monitor fluid shifts. Per Cardiology- "Continue Lasix 40mg  daily as needed for weight gain (3lbs in 1 day or 5lbs in 1 week) and edema"  2. Essential hypertension Nicole Gibbs is working on healthy weight loss and exercise to improve blood pressure control. We will watch for signs of hypotension as she continues her lifestyle modifications.  Check BP daily when taking 1/2 tab furosemide 40mg   to decrease edema- take for 3 days max.  Stop diuretic if systolic BP is less than 932- pt verbalized understanding/agreement.  3. Chronic Combined CHF/Non-Ischemic Cardiomyopathy Per Cardiology 06/15/20- "- LVEF 35-40% in 2017 but improved to 55% on most recent Echo in 05/2019. LHC in 2017 showed normal coronaries.  - Stable with chronic dyspnea on exertion. Appears euvolemic on exam.  - Continue Lasix 40mg  daily as needed for weight gain (3lbs in 1 day or 5lbs in 1 week) and edema. Will provide refill at patient's request. - Continue  Lisinopril 5mg  daily.  - Toprol-XL 100mg  daily listed under home medications but patient states she has not taken this in about 2 years. Given history of reduced EF, will restart at 20mg  daily.  - Continue daily weights and sodium/fluid restrictions"  F/u with Dr. [name] 4-6 months from Jan 2022.  She has an appt 10/14/20 with Dr. [name].   4. Class 3 severe obesity with serious comorbidity and body mass index (BMI) of 40.0 to 44.9 in adult, unspecified  obesity type [facility])  Nicole Gibbs is currently in the action stage of change. As such, her goal is to continue with weight loss efforts. She has agreed to the Category 2 Plan + 100 calories.   Recommend daily weighing to monitor fluid shifts.  Check fasting labs at next OV.  Exercise goals: As is  Behavioral modification strategies: increasing lean protein intake, decreasing simple carbohydrates, decreasing sodium intake, meal planning and cooking strategies and planning for success.  Nicole Gibbs has agreed to follow-up with our clinic fasting in 2 weeks. She was informed of the importance of frequent follow-up visits to maximize her success with intensive lifestyle modifications for her multiple health conditions.   Objective:   Blood pressure (!) 148/81, pulse 81, temperature (!) 97.5 F (36.4 C), height 5\' 1"  (1.549 m), weight 236 lb (107 kg), SpO2 96 %. Body mass index is 44.59 kg/m.  General: Cooperative, alert, well developed, in no acute distress. HEENT: Conjunctivae and lids unremarkable. Cardiovascular: Regular rhythm.  Lungs: Normal work of breathing. Neurologic: No focal deficits.   Lab Results  Component Value Date   CREATININE 0.78 06/30/2020   BUN 13 06/30/2020   NA 139 06/30/2020   K 4.2 06/30/2020   CL 102 06/30/2020   CO2 21 06/30/2020   Lab Results  Component Value Date   ALT 18 06/30/2020   AST 21 06/30/2020   ALKPHOS 83 06/30/2020   BILITOT 0.5 06/30/2020   Lab Results  Component Value Date   HGBA1C 5.9 (H) 06/30/2020   HGBA1C 6.0 (H) 04/01/2020   HGBA1C 6.0 12/16/2019   Lab Results  Component Value Date   INSULIN 17.1 06/30/2020   INSULIN 13.8 04/01/2020   Lab Results  Component Value Date   TSH 0.75 12/16/2019   Lab Results  Component Value Date   CHOL 154 12/16/2019   HDL 52 12/16/2019   LDLCALC 84 12/16/2019   TRIG 90 12/16/2019   Lab Results  Component Value Date   WBC 7.2 06/15/2020   HGB 15.5 06/15/2020   HCT 47.0 (H) 06/15/2020   MCV  86 06/15/2020   PLT 324 06/15/2020   Lab Results  Component Value Date   IRON 56 10/06/2015   TIBC 244 10/06/2015   FERRITIN 123 10/06/2015    Attestation Statements:   Reviewed by clinician on day of visit: allergies, medications, problem list, medical history, surgical history, family history, social history, and previous encounter notes.  Time spent on visit including pre-visit chart review and post-visit care and charting was 32 minutes.   Coral Ceo, am acting as Location manager for Mina Marble, NP.  I have reviewed the above documentation for accuracy and completeness, and I agree with the above. -  Temiloluwa Recchia d. Ruthvik Barnaby, NP-C

## 2020-09-16 DIAGNOSIS — Z23 Encounter for immunization: Secondary | ICD-10-CM | POA: Diagnosis not present

## 2020-09-17 ENCOUNTER — Telehealth (INDEPENDENT_AMBULATORY_CARE_PROVIDER_SITE_OTHER): Payer: Self-pay

## 2020-09-17 NOTE — Telephone Encounter (Signed)
Attempted x2 to contact pt to check on her due to edema at last OV without success. Pt has a call blocker on her phone that is not accepting calls and I was unable to leave a message. Pt's mychart status is pending as well so I am unable to send a message at this time.   Nicole Walby LPN

## 2020-09-28 ENCOUNTER — Ambulatory Visit (INDEPENDENT_AMBULATORY_CARE_PROVIDER_SITE_OTHER): Payer: Medicare Other | Admitting: Adult Health

## 2020-09-28 ENCOUNTER — Other Ambulatory Visit: Payer: Self-pay

## 2020-09-28 ENCOUNTER — Encounter (INDEPENDENT_AMBULATORY_CARE_PROVIDER_SITE_OTHER): Payer: Self-pay | Admitting: Adult Health

## 2020-09-28 VITALS — BP 148/88 | HR 95 | Temp 98.0°F | Ht 61.0 in | Wt 226.0 lb

## 2020-09-28 DIAGNOSIS — R7303 Prediabetes: Secondary | ICD-10-CM

## 2020-09-28 DIAGNOSIS — Z6841 Body Mass Index (BMI) 40.0 and over, adult: Secondary | ICD-10-CM

## 2020-09-28 DIAGNOSIS — R600 Localized edema: Secondary | ICD-10-CM | POA: Diagnosis not present

## 2020-09-28 DIAGNOSIS — I10 Essential (primary) hypertension: Secondary | ICD-10-CM | POA: Diagnosis not present

## 2020-09-28 DIAGNOSIS — E66813 Obesity, class 3: Secondary | ICD-10-CM

## 2020-09-28 NOTE — Progress Notes (Signed)
Chief Complaint:   OBESITY Nicole Gibbs is here to discuss her progress with her obesity treatment plan along with follow-up of her obesity related diagnoses. Nicole Gibbs is on the Category 2 Plan + 100 calories and states she is following her eating plan approximately 60% of the time. Nicole Gibbs states she is not currently exercising.  Today's visit was #: 49 Starting weight: 237 lbs Starting date: 01/07/2020 Today's weight: 226 lbs Today's date: 09/28/2020 Total lbs lost to date: 11 Total lbs lost since last in-office visit: 10  Interim History: Nicole Gibbs did take furosemide 40 mg 1/2 tab for 3 days. Now taking furosemide 40 mg QD. Reviewed current medication list: -furosemide 40 mg BID- increased from furosemide 40 mg as needed for fluid and edema by Cardiology.  She denies increased dyspnea or cough. Bioimpedance reflects 12 lbs of water decreased since last OV.  Subjective:   1. Lower extremity edema Nicole Gibbs completed 3 days course of furosemide 40 mg 1/2 tab. New furosemide prescription sent in 09/15/2020 by Cardiology- sig now reads 40 mg BID.  2. Essential hypertension BP/HR stable at OV. Nicole Gibbs denies chest pain, increased dyspnea, or palpitations.   BP Readings from Last 3 Encounters:  09/28/20 (!) 148/88  09/14/20 (!) 148/81  08/17/20 (!) 150/84   3. Pre-diabetes Nicole Gibbs is not on any BG lowering agents. 06/30/2020 A1c 5.9 with normal BG 93 and elevated insulin level 17.1.  Lab Results  Component Value Date   HGBA1C 5.9 (H) 06/30/2020   Lab Results  Component Value Date   INSULIN 17.1 06/30/2020   INSULIN 13.8 04/01/2020    Assessment/Plan:   1. Lower extremity edema Call cardiology about change in furosemide instructions. Pt verbalized understanding/agreement.  Check labs today.  - Comprehensive metabolic panel  2. Essential hypertension Nicole Gibbs is working on healthy weight loss and exercise to improve blood pressure control.  We will watch for signs of hypotension as she  continues her lifestyle modifications. - Call cardiology about change in furosemide instructions. Pt verbalized understanding/agreement. - Comprehensive metabolic panel  3. Pre-diabetes Nicole Gibbs will continue to work on weight loss, exercise, and decreasing simple carbohydrates to help decrease the risk of diabetes. Check labs today. - Hemoglobin A1c - Insulin, random  4. Class 3 severe obesity with serious comorbidity and body mass index (BMI) of 40.0 to 44.9 in adult, unspecified obesity type Nicole Gibbs)  Nicole Gibbs is currently in the action stage of change. As such, her goal is to continue with weight loss efforts. She has agreed to the Category 2 Plan + 100 calories.   Call cardiology about change in furosemide instructions. Daily weight  Exercise goals: No exercise has been prescribed at this time.  Behavioral modification strategies: increasing lean protein intake, decreasing simple carbohydrates, decreasing sodium intake, meal planning and cooking strategies, better snacking choices and planning for success.  Nicole Gibbs has agreed to follow-up with our clinic in 3 weeks. She was informed of the importance of frequent follow-up visits to maximize her success with intensive lifestyle modifications for her multiple health conditions.   Nicole Gibbs was informed we would discuss her lab results at her next visit unless there is a critical issue that needs to be addressed sooner. Nicole Gibbs agreed to keep her next visit at the agreed upon time to discuss these results.  Objective:   Blood pressure (!) 148/88, pulse 95, temperature 98 F (36.7 C), height 5\' 1"  (1.549 m), weight 226 lb (102.5 kg), SpO2 94 %. Body mass index is 42.7 kg/m.  General: Cooperative, alert, well developed, in no acute distress. HEENT: Conjunctivae and lids unremarkable. Cardiovascular: Regular rhythm.  Lungs: Normal work of breathing. Neurologic: No focal deficits.   Lab Results  Component Value Date   CREATININE 0.78 06/30/2020    BUN 13 06/30/2020   NA 139 06/30/2020   K 4.2 06/30/2020   CL 102 06/30/2020   CO2 21 06/30/2020   Lab Results  Component Value Date   ALT 18 06/30/2020   AST 21 06/30/2020   ALKPHOS 83 06/30/2020   BILITOT 0.5 06/30/2020   Lab Results  Component Value Date   HGBA1C 5.9 (H) 06/30/2020   HGBA1C 6.0 (H) 04/01/2020   HGBA1C 6.0 12/16/2019   Lab Results  Component Value Date   INSULIN 17.1 06/30/2020   INSULIN 13.8 04/01/2020   Lab Results  Component Value Date   TSH 0.75 12/16/2019   Lab Results  Component Value Date   CHOL 154 12/16/2019   HDL 52 12/16/2019   LDLCALC 84 12/16/2019   TRIG 90 12/16/2019   Lab Results  Component Value Date   WBC 7.2 06/15/2020   HGB 15.5 06/15/2020   HCT 47.0 (H) 06/15/2020   MCV 86 06/15/2020   PLT 324 06/15/2020   Lab Results  Component Value Date   IRON 56 10/06/2015   TIBC 244 10/06/2015   FERRITIN 123 10/06/2015    Obesity Behavioral Intervention:   Approximately 15 minutes were spent on the discussion below.  ASK: We discussed the diagnosis of obesity with Nicole Gibbs today and Nicole Gibbs agreed to give [place] permission to discuss obesity behavioral modification therapy today.  ASSESS: Nicole Gibbs has the diagnosis of obesity and her BMI today is 42.9. Nicole Gibbs is in the action stage of change.   ADVISE: Nicole Gibbs was educated on the multiple health risks of obesity as well as the benefit of weight loss to improve her health. She was advised of the need for long term treatment and the importance of lifestyle modifications to improve her current health and to decrease her risk of future health problems.  AGREE: Multiple dietary modification options and treatment options were discussed and Nicole Gibbs agreed to follow the recommendations documented in the above note.  ARRANGE: Nicole Gibbs was educated on the importance of frequent visits to treat obesity as outlined per CMS and USPSTF guidelines and agreed to schedule her next follow up appointment  today.  Attestation Statements:   Reviewed by clinician on day of visit: allergies, medications, problem list, medical history, surgical history, family history, social history, and previous encounter notes.  Coral Ceo, CMA, am acting as transcriptionist for Mina Marble, NP.  I have reviewed the above documentation for accuracy and completeness, and I agree with the above. -  Orien Mayhall d. Heyli Min, NP-C

## 2020-09-29 LAB — COMPREHENSIVE METABOLIC PANEL
ALT: 14 IU/L (ref 0–32)
AST: 21 IU/L (ref 0–40)
Albumin/Globulin Ratio: 1.4 (ref 1.2–2.2)
Albumin: 4.2 g/dL (ref 3.7–4.7)
Alkaline Phosphatase: 75 IU/L (ref 44–121)
BUN/Creatinine Ratio: 26 (ref 12–28)
BUN: 18 mg/dL (ref 8–27)
Bilirubin Total: 0.7 mg/dL (ref 0.0–1.2)
CO2: 22 mmol/L (ref 20–29)
Calcium: 9.3 mg/dL (ref 8.7–10.3)
Chloride: 103 mmol/L (ref 96–106)
Creatinine, Ser: 0.69 mg/dL (ref 0.57–1.00)
Globulin, Total: 2.9 g/dL (ref 1.5–4.5)
Glucose: 97 mg/dL (ref 65–99)
Potassium: 3.8 mmol/L (ref 3.5–5.2)
Sodium: 142 mmol/L (ref 134–144)
Total Protein: 7.1 g/dL (ref 6.0–8.5)
eGFR: 92 mL/min/{1.73_m2} (ref >59)

## 2020-09-29 LAB — INSULIN, RANDOM: INSULIN: 11.2 u[IU]/mL (ref 2.6–24.9)

## 2020-09-29 LAB — HEMOGLOBIN A1C
Est. average glucose Bld gHb Est-mCnc: 120 mg/dL
Hgb A1c MFr Bld: 5.8 % — ABNORMAL HIGH (ref 4.8–5.6)

## 2020-10-02 ENCOUNTER — Other Ambulatory Visit: Payer: Self-pay | Admitting: *Deleted

## 2020-10-02 NOTE — Patient Outreach (Signed)
[facility]) Care Management  10/02/2020  Chelby Salata December 14, 1945 [id number]   Telephone Assessment-[facility] Assigned  Outreach #1  Spoke with pt today and introduced [facility] services and the purpose for today's call. Pt verified her primary provider who has partnered with [facility] for ongoing care management service. Explained she should be receiving a call for pending services from this agency over the next few weeks.   RN inquired on any needs at this time. [facility] and to her cardiologist (6/1 next appointment). States she is urinating a lot due to fluid medication. RN educated on this subject and inquired on any other needs or issues (none at this time).   RN offered to assist with any of her needs until [facility] becomes involved for ongoing services. Verified pt had her provider contact number for acute issues for primary and cardiologist. No other issues or needs to address at this time.  [facility] will remain available for ongoing needs at this time.  Raina Mina, RN Care Management Coordinator [place] Office [phone]

## 2020-10-13 ENCOUNTER — Other Ambulatory Visit: Payer: Self-pay | Admitting: Cardiovascular Disease

## 2020-10-14 ENCOUNTER — Ambulatory Visit (INDEPENDENT_AMBULATORY_CARE_PROVIDER_SITE_OTHER): Payer: Medicare Other | Admitting: Cardiovascular Disease

## 2020-10-14 ENCOUNTER — Other Ambulatory Visit: Payer: Self-pay

## 2020-10-14 ENCOUNTER — Encounter: Payer: Self-pay | Admitting: Cardiovascular Disease

## 2020-10-14 ENCOUNTER — Other Ambulatory Visit: Payer: Self-pay | Admitting: Cardiovascular Disease

## 2020-10-14 ENCOUNTER — Ambulatory Visit (INDEPENDENT_AMBULATORY_CARE_PROVIDER_SITE_OTHER): Payer: Medicare Other

## 2020-10-14 DIAGNOSIS — Z7901 Long term (current) use of anticoagulants: Secondary | ICD-10-CM | POA: Diagnosis not present

## 2020-10-14 DIAGNOSIS — I10 Essential (primary) hypertension: Secondary | ICD-10-CM

## 2020-10-14 DIAGNOSIS — G4733 Obstructive sleep apnea (adult) (pediatric): Secondary | ICD-10-CM

## 2020-10-14 DIAGNOSIS — I82403 Acute embolism and thrombosis of unspecified deep veins of lower extremity, bilateral: Secondary | ICD-10-CM | POA: Diagnosis not present

## 2020-10-14 DIAGNOSIS — R0600 Dyspnea, unspecified: Secondary | ICD-10-CM | POA: Diagnosis not present

## 2020-10-14 DIAGNOSIS — R0609 Other forms of dyspnea: Secondary | ICD-10-CM

## 2020-10-14 DIAGNOSIS — I2699 Other pulmonary embolism without acute cor pulmonale: Secondary | ICD-10-CM | POA: Diagnosis not present

## 2020-10-14 DIAGNOSIS — I5022 Chronic systolic (congestive) heart failure: Secondary | ICD-10-CM | POA: Diagnosis not present

## 2020-10-14 LAB — POCT INR: INR: 3.5 — AB (ref 2.0–3.0)

## 2020-10-14 NOTE — Assessment & Plan Note (Signed)
History of systolic heart failure in the past with EF in the 35 to 40% range which has increased to normal by 2D echo performed 06/10/2019.  She did have grade 1 diastolic dysfunction.

## 2020-10-14 NOTE — Assessment & Plan Note (Signed)
History of obstructive sleep apnea intolerant to CPAP 

## 2020-10-14 NOTE — Patient Instructions (Signed)
Medication Instructions:  Your physician recommends that you continue on your current medications as directed. Please refer to the Current Medication list given to you today.  *If you need a refill on your cardiac medications before your next appointment, please call your pharmacy*   Follow-Up: At [facility], you and your health needs are our priority.  As part of our continuing mission to provide you with exceptional heart care, we have created designated Provider Care Teams.  These Care Teams include your primary Cardiologist (physician) and Advanced Practice Providers (APPs -  Physician Assistants and Nurse Practitioners) who all work together to provide you with the care you need, when you need it.  We recommend signing up for the patient portal called "MyChart".  Sign up information is provided on this After Visit Summary.  MyChart is used to connect with patients for Virtual Visits (Telemedicine).  Patients are able to view lab/test results, encounter notes, upcoming appointments, etc.  Non-urgent messages can be sent to your provider as well.   To learn more about what you can do with MyChart, go to [email or web address].    Your next appointment:   6 month(s)  The format for your next appointment:   In Person  Provider:   You will see one of the following Advanced Practice Providers on your designated Care Team:    Callie Goodrich, PA-C  Jesse Cleaver, FNP  Then, Jonathan Berry, MD will plan to see you again in 12 month(s). 

## 2020-10-14 NOTE — Assessment & Plan Note (Signed)
History of essential hypertension a blood pressure measured at 126/80.  She is on lisinopril.

## 2020-10-14 NOTE — Assessment & Plan Note (Signed)
History of remote DVT and pulmonary emboli on warfarin oral anticoagulation.

## 2020-10-14 NOTE — Progress Notes (Signed)
10/14/2020 Nicole Gibbs   [birth year]  [id number]  Primary Physician Adria Dill [name], FNP Primary Cardiologist: [name] MD [name], [place]  HPI:  Nicole Gibbs is a 75 y.o.  severely overweight, married Caucasian female, mother of one child, who Ilast saw him in the office  11/12/2019.IInitially saw because of preop clearance and shortness of breath on August 02, 2012..She ended up having bilateral DVTs and multiple pulmonary emboli and was admitted and placed on Lovenox and Coumadin. She had normal LV systolic function and mild RV dysfunction. Ultimately, because of vaginal bleeding, she underwent a D&C with endometrial ablation by Dr. Helane Rima September 04, 4096, without complication. She is no longer bleeding, and her shortness of breath has improved. Her Coumadin is followed here in our office. A followup CT angiogram revealed complete resolution of her pulmonary emboli but she did have an incidentally noted multinodular goiter. Follow-up lower extremity venous Doppler study showed resolution of her DVT. She does complain of progressive dyspnea which is somewhat limiting. She has been admitted to [facility] in December and again in February with anemia and heart failure. A recent Myoview stress test showed anteroapical scar and a 2-D echo revealed an ejection fraction of 35-40% which represents this is a significant decline compared to her EF 3 years ago which was normal.. She has had a negative upper and lower endoscopy with plans to perform pill endoscopy as well. Her hemoglobin has recently remained stable. I performed outpatient cardiac catheterization on her 09/03/15 revealing normal coronary arteries with ejection fraction 35-445% consistent with a nonischemiccardiomyopathy.  Since I saw her a year ago she continues to do well.  Her last echo performed 06/10/2019 revealed normal LV systolic function with grade 1 diastolic dysfunction.  She is [facility] Raliegh Scarlet.  She is unable to wear her CPAP unfortunately.  Current Meds  Medication Sig  . acetaminophen (TYLENOL) 325 MG tablet Take 650 mg by mouth every 6 (six) hours as needed for moderate pain.  . eszopiclone (LUNESTA) 2 MG TABS tablet TAKE 1 TABLET BY MOUTH AT BEDTIME AS NEEDED  . furosemide (LASIX) 40 MG tablet Take 1 tablet by mouth twice daily  . methimazole (TAPAZOLE) 5 MG tablet Take 5 mg by mouth daily.  . metoprolol succinate (TOPROL XL) 25 MG 24 hr tablet Take 1 tablet (25 mg total) by mouth daily.  . nitroGLYCERIN (NITROSTAT) 0.4 MG SL tablet Place 1 tablet (0.4 mg total) under the tongue every 5 (five) minutes as needed for chest pain.  . progesterone (PROMETRIUM) 200 MG capsule Take 200 mg by mouth at bedtime.   Marland Kitchen warfarin (COUMADIN) 3 MG tablet TAKE 1 TO 1 & 1/2 (ONE & ONE-HALF) TABLETS BY MOUTH ONCE DAILY AS DIRECTED     Allergies  Allergen Reactions  . Keflex [Cephalexin] Diarrhea  . Sulfa Antibiotics Hives    Social History   Socioeconomic History  . Marital status: Widowed    Spouse name: Not on file  . Number of children: Not on file  . Years of education: Not on file  . Highest education level: Not on file  Occupational History  . Occupation: retired  Tobacco Use  . Smoking status: Never Smoker  . Smokeless tobacco: Never Used  Substance and Sexual Activity  . Alcohol use: No  . Drug use: No  . Sexual activity: Not on file  Other Topics Concern  . Not  on file  Social History Narrative  . Not on file   Social Determinants of Health   Financial Resource Strain: Not on file  Food Insecurity: Not on file  Transportation Needs: Not on file  Physical Activity: Not on file  Stress: Not on file  Social Connections: Not on file  Intimate Partner Violence: Not on file     Review of Systems: General: negative for chills, fever, night sweats or weight changes.  Cardiovascular: negative for chest pain, dyspnea on  exertion, edema, orthopnea, palpitations, paroxysmal nocturnal dyspnea or shortness of breath Dermatological: negative for rash Respiratory: negative for cough or wheezing Urologic: negative for hematuria Abdominal: negative for nausea, vomiting, diarrhea, bright red blood per rectum, melena, or hematemesis Neurologic: negative for visual changes, syncope, or dizziness All other systems reviewed and are otherwise negative except as noted above.    Blood pressure 126/80, pulse (!) 103, height 5\' 1"  (1.549 m), weight 225 lb (102.1 kg).  General appearance: alert and no distress Neck: no adenopathy, no carotid bruit, no JVD, supple, symmetrical, trachea midline and thyroid not enlarged, symmetric, no tenderness/mass/nodules Lungs: clear to auscultation bilaterally Heart: regular rate and rhythm, S1, S2 normal, no murmur, click, rub or gallop Extremities: extremities normal, atraumatic, no cyanosis or edema Pulses: 2+ and symmetric Skin: Skin color, texture, turgor normal. No rashes or lesions Neurologic: Alert and oriented X 3, normal strength and tone. Normal symmetric reflexes. Normal coordination and gait  EKG sinus tachycardia at 103 with left ventricle hypertrophy and repolarization changes and occasional PACs.  I personally reviewed this EKG.  ASSESSMENT AND PLAN:   Pulmonary embolism, Bilateral History of remote DVT and pulmonary emboli on warfarin oral anticoagulation.  Essential hypertension History of essential hypertension a blood pressure measured at 126/80.  She is on lisinopril.  Dyspnea on exertion History of dyspnea on exertion which is probably multifactorial from diastolic dysfunction, obesity and sedentary lifestyle/deconditioning.  Systolic CHF, chronic ([place]) History of systolic heart failure in the past with EF in the 35 to 40% range which has increased to normal by 2D echo performed 06/10/2019.  She did have grade 1 diastolic dysfunction.  OSA (obstructive sleep  apnea) History of obstructive sleep apnea intolerant to CPAP.      [name] MD FACP,FACC,FAHA, [facility] 10/14/2020 1:29 PM

## 2020-10-14 NOTE — Patient Instructions (Signed)
Continue 1 tablet daily except 1.5 tablets each Monday, Wednesday and Friday.  Repeat INR in 6 weeks.  Eat greens tonight.

## 2020-10-14 NOTE — Assessment & Plan Note (Signed)
History of dyspnea on exertion which is probably multifactorial from diastolic dysfunction, obesity and sedentary lifestyle/deconditioning.

## 2020-10-16 ENCOUNTER — Other Ambulatory Visit: Payer: Self-pay | Admitting: Cardiovascular Disease

## 2020-10-16 ENCOUNTER — Telehealth: Payer: Self-pay | Admitting: Cardiovascular Disease

## 2020-10-16 NOTE — Telephone Encounter (Signed)
*  STAT* If patient is at the pharmacy, call can be transferred to refill team.   1. Which medications need to be refilled? (please list name of each medication and dose if known)  eszopiclone (LUNESTA) 2 MG TABS tablet  2. Which pharmacy/location (including street and city if local pharmacy) is medication to be sent to? [place], [place] [address]  3. Do they need a 30 day or 90 day supply? 90   Patient is out of medication  Patient contacted her pharmacy and they do not have a new rx from our office

## 2020-10-19 ENCOUNTER — Ambulatory Visit (INDEPENDENT_AMBULATORY_CARE_PROVIDER_SITE_OTHER): Payer: Medicare Other | Admitting: Adult Health

## 2020-10-19 ENCOUNTER — Telehealth: Payer: Self-pay | Admitting: Cardiovascular Disease

## 2020-10-19 MED ORDER — ESZOPICLONE 2 MG PO TABS
2.0000 mg | ORAL_TABLET | Freq: Every evening | ORAL | 0 refills | Status: DC | PRN
Start: 2020-10-19 — End: 2020-10-19

## 2020-10-19 MED ORDER — ESZOPICLONE 2 MG PO TABS: 2.0000 mg | ORAL_TABLET | Freq: Every evening | ORAL | 0 refills | Status: DC | PRN

## 2020-10-19 NOTE — Telephone Encounter (Signed)
Called in Rx request  Patient saw Dr. Claiborne Billings for sleep - is a PRN follow up   Will route to Dr. Claiborne Billings regarding subsequent refills

## 2020-10-19 NOTE — Addendum Note (Signed)
Addended by: Fidel Levy on: 10/19/2020 09:37 AM   Modules accepted: Orders

## 2020-10-19 NOTE — Telephone Encounter (Signed)
Did not need this encounter °

## 2020-10-20 ENCOUNTER — Other Ambulatory Visit: Payer: Self-pay | Admitting: *Deleted

## 2020-10-20 MED ORDER — ESZOPICLONE 2 MG PO TABS: 2.0000 mg | ORAL_TABLET | Freq: Every evening | ORAL | 0 refills | Status: DC | PRN

## 2020-10-26 ENCOUNTER — Ambulatory Visit (INDEPENDENT_AMBULATORY_CARE_PROVIDER_SITE_OTHER): Payer: Medicare Other | Admitting: Adult Health

## 2020-10-26 ENCOUNTER — Other Ambulatory Visit: Payer: Self-pay

## 2020-10-26 ENCOUNTER — Encounter (INDEPENDENT_AMBULATORY_CARE_PROVIDER_SITE_OTHER): Payer: Self-pay | Admitting: Adult Health

## 2020-10-26 VITALS — BP 115/80 | HR 87 | Temp 97.8°F | Ht 61.0 in | Wt 226.0 lb

## 2020-10-26 DIAGNOSIS — R0609 Other forms of dyspnea: Secondary | ICD-10-CM

## 2020-10-26 DIAGNOSIS — R600 Localized edema: Secondary | ICD-10-CM

## 2020-10-26 DIAGNOSIS — G4733 Obstructive sleep apnea (adult) (pediatric): Secondary | ICD-10-CM | POA: Diagnosis not present

## 2020-10-26 DIAGNOSIS — I10 Essential (primary) hypertension: Secondary | ICD-10-CM | POA: Diagnosis not present

## 2020-10-26 DIAGNOSIS — R0600 Dyspnea, unspecified: Secondary | ICD-10-CM

## 2020-10-26 DIAGNOSIS — Z6841 Body Mass Index (BMI) 40.0 and over, adult: Secondary | ICD-10-CM

## 2020-10-26 DIAGNOSIS — R7303 Prediabetes: Secondary | ICD-10-CM | POA: Diagnosis not present

## 2020-10-28 NOTE — Progress Notes (Signed)
Chief Complaint:   OBESITY Nicole Gibbs is here to discuss her progress with her obesity treatment plan along with follow-up of her obesity related diagnoses. Nicole Gibbs is on the Category 2 Plan + 100 and states she is following her eating plan approximately 60% of the time. Nicole Gibbs states she is not currently exercising.  Today's visit was #: 15 Starting weight: 237 lbs Starting date: 01/07/2020 Today's weight: 226 lbs Today's date: 10/26/2020 Total lbs lost to date: 11 Total lbs lost since last in-office visit: 0  Interim History: Nicole Gibbs was seen by cardiologist, Dr. [name] on 10/14/2020. No changes to medication and no studies were ordered. She feels that dyspnea on exertion is worsening. She did not mention this to cardiology. Her symptoms started about 3 weeks ago. She denies CP or palpitations. She is glad she maintained her weight. She is "very conscious of what I put in my mouth." She will use all 300 snack calories at breakfast.  She will often snack on high calories nuts in addition to extra breakfast calories.  Subjective:   1. Essential hypertension Discussed labs with patient today. BP/HR at goal at [place]. She does report increased dyspnea on exertion over the last 3 weeks.  BP Readings from Last 3 Encounters:  10/26/20 115/80  10/14/20 126/80  09/28/20 (!) 148/88   2. OSA (obstructive sleep apnea) Nicole Gibbs is unable to tolerate CPAP. She returned CPAP machine.  3. Dyspnea on exertion Nicole Gibbs feels that this is worsening. She did not mention to cardiology at last OV.  4. Lower extremity edema Discussed labs with patient today. Per cardiology, furosemide 40 mg QD. Stable edema.  5. Pre-diabetes Discussed labs with patient today. 09/28/2020 A1c 5.8, slight improvement from 5.9 on 06/30/2020. Nicole Gibbs's insulin level is down to 11.2 from 17.1 on 06/30/2020.  Lab Results  Component Value Date   HGBA1C 5.8 (H) 09/28/2020   Lab Results  Component Value Date   INSULIN 11.2 09/28/2020    INSULIN 17.1 06/30/2020   INSULIN 13.8 04/01/2020    Assessment/Plan:   1. Essential hypertension Nicole Gibbs is working on healthy weight loss and exercise to improve blood pressure control. We will watch for signs of hypotension as she continues her lifestyle modifications. -Continue current anti-hypertensive regimen per cardiology.  2. OSA (obstructive sleep apnea) Intensive lifestyle modifications are the first line treatment for this issue. We discussed several lifestyle modifications today and she will continue to work on diet, exercise and weight loss efforts. We will continue to monitor. Orders and follow up as documented in patient record.  -Follow up with Dr. Claiborne Billings PRN.  3. Dyspnea on exertion Follow up with cardiology for worsening dyspnea  exertion.  4. Lower extremity edema Continue with furosemide as directed by cardiology.  5. Pre-diabetes Nicole Gibbs will continue to work on weight loss, exercise, and decreasing simple carbohydrates to help decrease the risk of diabetes.   6. Class 3 severe obesity with serious comorbidity and body mass index (BMI) of 40.0 to 44.9 in adult, unspecified obesity type Nicole Gibbs)  Nicole Gibbs is currently in the action stage of change. As such, her goal is to continue with weight loss efforts. She has agreed to the Category 2 Plan + 100.   Follow up with cardiology regarding worsening dyspnea on exertion.  Exercise goals:  Increase activity as tolerated.  Behavioral modification strategies: increasing lean protein intake, decreasing simple carbohydrates, meal planning and cooking strategies, keeping healthy foods in the home, and planning for success.  Nicole Gibbs has agreed  to follow-up with our clinic in 4 weeks. She was informed of the importance of frequent follow-up visits to maximize her success with intensive lifestyle modifications for her multiple health conditions.   Objective:   Blood pressure 115/80, pulse 87, temperature 97.8 F (36.6 C), height 5'  1" (1.549 m), weight 226 lb (102.5 kg), SpO2 95 %. Body mass index is 42.7 kg/m.  General: Cooperative, alert, well developed, in no acute distress. HEENT: Conjunctivae and lids unremarkable. Cardiovascular: Regular rhythm.  Lungs: Normal work of breathing. Neurologic: No focal deficits.   Lab Results  Component Value Date   CREATININE 0.69 09/28/2020   BUN 18 09/28/2020   NA 142 09/28/2020   K 3.8 09/28/2020   CL 103 09/28/2020   CO2 22 09/28/2020   Lab Results  Component Value Date   ALT 14 09/28/2020   AST 21 09/28/2020   ALKPHOS 75 09/28/2020   BILITOT 0.7 09/28/2020   Lab Results  Component Value Date   HGBA1C 5.8 (H) 09/28/2020   HGBA1C 5.9 (H) 06/30/2020   HGBA1C 6.0 (H) 04/01/2020   HGBA1C 6.0 12/16/2019   Lab Results  Component Value Date   INSULIN 11.2 09/28/2020   INSULIN 17.1 06/30/2020   INSULIN 13.8 04/01/2020   Lab Results  Component Value Date   Nicole Gibbs 0.75 12/16/2019   Lab Results  Component Value Date   CHOL 154 12/16/2019   HDL 52 12/16/2019   LDLCALC 84 12/16/2019   TRIG 90 12/16/2019   Lab Results  Component Value Date   WBC 7.2 06/15/2020   HGB 15.5 06/15/2020   HCT 47.0 (H) 06/15/2020   MCV 86 06/15/2020   PLT 324 06/15/2020   Lab Results  Component Value Date   IRON 56 10/06/2015   TIBC 244 10/06/2015   FERRITIN 123 10/06/2015    Attestation Statements:   Reviewed by clinician on day of visit: allergies, medications, problem list, medical history, surgical history, family history, social history, and previous encounter notes.  Time spent on visit including pre-visit chart review and post-visit care and charting was 32 minutes.   Coral Ceo, CMA, am acting as transcriptionist for Nicole Marble, NP.  I have reviewed the above documentation for accuracy and completeness, and I agree with the above. -  Yetta Marceaux d. Creedon Danielski, NP-C

## 2020-11-02 ENCOUNTER — Other Ambulatory Visit: Payer: Self-pay | Admitting: *Deleted

## 2020-11-02 NOTE — Patient Outreach (Signed)
[facility]) Care Management  11/02/2020  Nicole Gibbs 1945/07/20 [id number]   Telephone Assessment  Based upon the last conversation last month pt was pending services with [facility]. RN was following up as planned to see if the services has taken place as requested by pt.   RN attempted outreach today however unsuccessful. RN able to leave a HIPAA approved voice message requesting a call back. Will continue to follow up concerning pending services with [facility]. Again as indicated by pt.  Will follow up next month once again if not sooner for update on pt's ongoing care and services.  Raina Mina, RN Care Management Coordinator [place] Office [phone]

## 2020-11-09 NOTE — Telephone Encounter (Signed)
Okay to renew [place]

## 2020-11-24 ENCOUNTER — Ambulatory Visit (INDEPENDENT_AMBULATORY_CARE_PROVIDER_SITE_OTHER): Payer: Medicare Other | Admitting: Adult Health

## 2020-11-24 ENCOUNTER — Inpatient Hospital Stay (HOSPITAL_COMMUNITY)
Admission: EM | Admit: 2020-11-24 | Discharge: 2020-11-30 | DRG: 291 | Disposition: A | Discharge status: Home / Self Care | Payer: Medicare Other | Attending: Internal Medicine | Admitting: Internal Medicine

## 2020-11-24 ENCOUNTER — Encounter (HOSPITAL_COMMUNITY): Payer: Self-pay

## 2020-11-24 ENCOUNTER — Other Ambulatory Visit: Payer: Self-pay

## 2020-11-24 ENCOUNTER — Emergency Department (HOSPITAL_COMMUNITY): Payer: Medicare Other

## 2020-11-24 VITALS — BP 108/73 | HR 105 | Temp 97.5°F | Ht 61.0 in | Wt 229.0 lb

## 2020-11-24 DIAGNOSIS — I5042 Chronic combined systolic (congestive) and diastolic (congestive) heart failure: Secondary | ICD-10-CM

## 2020-11-24 DIAGNOSIS — Z882 Allergy status to sulfonamides status: Secondary | ICD-10-CM | POA: Diagnosis not present

## 2020-11-24 DIAGNOSIS — I5043 Acute on chronic combined systolic (congestive) and diastolic (congestive) heart failure: Secondary | ICD-10-CM | POA: Diagnosis present

## 2020-11-24 DIAGNOSIS — R531 Weakness: Secondary | ICD-10-CM | POA: Diagnosis not present

## 2020-11-24 DIAGNOSIS — I5033 Acute on chronic diastolic (congestive) heart failure: Secondary | ICD-10-CM | POA: Diagnosis not present

## 2020-11-24 DIAGNOSIS — I5023 Acute on chronic systolic (congestive) heart failure: Secondary | ICD-10-CM | POA: Diagnosis not present

## 2020-11-24 DIAGNOSIS — F32A Depression, unspecified: Secondary | ICD-10-CM | POA: Diagnosis present

## 2020-11-24 DIAGNOSIS — I517 Cardiomegaly: Secondary | ICD-10-CM | POA: Diagnosis not present

## 2020-11-24 DIAGNOSIS — E038 Other specified hypothyroidism: Secondary | ICD-10-CM | POA: Diagnosis present

## 2020-11-24 DIAGNOSIS — I471 Supraventricular tachycardia: Secondary | ICD-10-CM | POA: Diagnosis present

## 2020-11-24 DIAGNOSIS — R0609 Other forms of dyspnea: Secondary | ICD-10-CM

## 2020-11-24 DIAGNOSIS — Z833 Family history of diabetes mellitus: Secondary | ICD-10-CM

## 2020-11-24 DIAGNOSIS — Z86711 Personal history of pulmonary embolism: Secondary | ICD-10-CM | POA: Diagnosis not present

## 2020-11-24 DIAGNOSIS — I110 Hypertensive heart disease with heart failure: Secondary | ICD-10-CM | POA: Diagnosis present

## 2020-11-24 DIAGNOSIS — F419 Anxiety disorder, unspecified: Secondary | ICD-10-CM | POA: Diagnosis present

## 2020-11-24 DIAGNOSIS — Z85828 Personal history of other malignant neoplasm of skin: Secondary | ICD-10-CM | POA: Diagnosis not present

## 2020-11-24 DIAGNOSIS — R059 Cough, unspecified: Secondary | ICD-10-CM | POA: Diagnosis not present

## 2020-11-24 DIAGNOSIS — Z8349 Family history of other endocrine, nutritional and metabolic diseases: Secondary | ICD-10-CM

## 2020-11-24 DIAGNOSIS — I10 Essential (primary) hypertension: Secondary | ICD-10-CM | POA: Diagnosis not present

## 2020-11-24 DIAGNOSIS — R0600 Dyspnea, unspecified: Secondary | ICD-10-CM | POA: Diagnosis present

## 2020-11-24 DIAGNOSIS — Z86718 Personal history of other venous thrombosis and embolism: Secondary | ICD-10-CM

## 2020-11-24 DIAGNOSIS — Z823 Family history of stroke: Secondary | ICD-10-CM

## 2020-11-24 DIAGNOSIS — Z881 Allergy status to other antibiotic agents status: Secondary | ICD-10-CM

## 2020-11-24 DIAGNOSIS — G4733 Obstructive sleep apnea (adult) (pediatric): Secondary | ICD-10-CM

## 2020-11-24 DIAGNOSIS — I493 Ventricular premature depolarization: Secondary | ICD-10-CM | POA: Diagnosis present

## 2020-11-24 DIAGNOSIS — Z6841 Body Mass Index (BMI) 40.0 and over, adult: Secondary | ICD-10-CM

## 2020-11-24 DIAGNOSIS — M7989 Other specified soft tissue disorders: Secondary | ICD-10-CM | POA: Diagnosis present

## 2020-11-24 DIAGNOSIS — R000 Tachycardia, unspecified: Secondary | ICD-10-CM | POA: Diagnosis not present

## 2020-11-24 DIAGNOSIS — I420 Dilated cardiomyopathy: Secondary | ICD-10-CM | POA: Diagnosis present

## 2020-11-24 DIAGNOSIS — J9621 Acute and chronic respiratory failure with hypoxia: Secondary | ICD-10-CM | POA: Diagnosis not present

## 2020-11-24 DIAGNOSIS — R0902 Hypoxemia: Secondary | ICD-10-CM | POA: Diagnosis not present

## 2020-11-24 DIAGNOSIS — J9811 Atelectasis: Secondary | ICD-10-CM | POA: Diagnosis not present

## 2020-11-24 DIAGNOSIS — R600 Localized edema: Secondary | ICD-10-CM | POA: Diagnosis present

## 2020-11-24 DIAGNOSIS — R0602 Shortness of breath: Secondary | ICD-10-CM | POA: Diagnosis not present

## 2020-11-24 DIAGNOSIS — R7303 Prediabetes: Secondary | ICD-10-CM | POA: Diagnosis present

## 2020-11-24 DIAGNOSIS — J9601 Acute respiratory failure with hypoxia: Secondary | ICD-10-CM | POA: Diagnosis present

## 2020-11-24 DIAGNOSIS — G4700 Insomnia, unspecified: Secondary | ICD-10-CM | POA: Diagnosis present

## 2020-11-24 DIAGNOSIS — Z7901 Long term (current) use of anticoagulants: Secondary | ICD-10-CM

## 2020-11-24 DIAGNOSIS — I4891 Unspecified atrial fibrillation: Secondary | ICD-10-CM | POA: Diagnosis present

## 2020-11-24 DIAGNOSIS — Z20822 Contact with and (suspected) exposure to covid-19: Secondary | ICD-10-CM | POA: Diagnosis present

## 2020-11-24 DIAGNOSIS — Z8249 Family history of ischemic heart disease and other diseases of the circulatory system: Secondary | ICD-10-CM

## 2020-11-24 DIAGNOSIS — Z79899 Other long term (current) drug therapy: Secondary | ICD-10-CM

## 2020-11-24 DIAGNOSIS — Z818 Family history of other mental and behavioral disorders: Secondary | ICD-10-CM

## 2020-11-24 DIAGNOSIS — I82403 Acute embolism and thrombosis of unspecified deep veins of lower extremity, bilateral: Secondary | ICD-10-CM | POA: Diagnosis present

## 2020-11-24 DIAGNOSIS — E0590 Thyrotoxicosis, unspecified without thyrotoxic crisis or storm: Secondary | ICD-10-CM | POA: Diagnosis present

## 2020-11-24 DIAGNOSIS — R0689 Other abnormalities of breathing: Secondary | ICD-10-CM | POA: Diagnosis not present

## 2020-11-24 DIAGNOSIS — I509 Heart failure, unspecified: Secondary | ICD-10-CM

## 2020-11-24 DIAGNOSIS — J811 Chronic pulmonary edema: Secondary | ICD-10-CM | POA: Diagnosis not present

## 2020-11-24 DIAGNOSIS — B372 Candidiasis of skin and nail: Secondary | ICD-10-CM | POA: Diagnosis present

## 2020-11-24 DIAGNOSIS — R791 Abnormal coagulation profile: Secondary | ICD-10-CM | POA: Diagnosis present

## 2020-11-24 DIAGNOSIS — Z9114 Patient's other noncompliance with medication regimen: Secondary | ICD-10-CM

## 2020-11-24 LAB — BASIC METABOLIC PANEL
Anion gap: 7 (ref 5–15)
BUN: 16 mg/dL (ref 8–23)
CO2: 24 mmol/L (ref 22–32)
Calcium: 9.4 mg/dL (ref 8.9–10.3)
Chloride: 109 mmol/L (ref 98–111)
Creatinine, Ser: 0.95 mg/dL (ref 0.44–1.00)
GFR, Estimated: >60 mL/min (ref >60)
Glucose, Bld: 113 mg/dL — ABNORMAL HIGH (ref 70–99)
Potassium: 4.4 mmol/L (ref 3.5–5.1)
Sodium: 140 mmol/L (ref 135–145)

## 2020-11-24 LAB — CBC WITH DIFFERENTIAL/PLATELET
Abs Immature Granulocytes: 0.03 10*3/uL (ref 0.00–0.07)
Basophils Absolute: 0 10*3/uL (ref 0.0–0.1)
Basophils Relative: 0 %
Eosinophils Absolute: 0.1 10*3/uL (ref 0.0–0.5)
Eosinophils Relative: 1 %
HCT: 49 % — ABNORMAL HIGH (ref 36.0–46.0)
Hemoglobin: 15.3 g/dL — ABNORMAL HIGH (ref 12.0–15.0)
Immature Granulocytes: 0 %
Lymphocytes Relative: 25 %
Lymphs Abs: 2.2 10*3/uL (ref 0.7–4.0)
MCH: 28.9 pg (ref 26.0–34.0)
MCHC: 31.2 g/dL (ref 30.0–36.0)
MCV: 92.5 fL (ref 80.0–100.0)
Monocytes Absolute: 0.8 10*3/uL (ref 0.1–1.0)
Monocytes Relative: 9 %
Neutro Abs: 5.9 10*3/uL (ref 1.7–7.7)
Neutrophils Relative %: 65 %
Platelets: 284 10*3/uL (ref 150–400)
RBC: 5.3 MIL/uL — ABNORMAL HIGH (ref 3.87–5.11)
RDW: 14.4 % (ref 11.5–15.5)
WBC: 9.2 10*3/uL (ref 4.0–10.5)
nRBC: 0 % (ref 0.0–0.2)

## 2020-11-24 LAB — RESP PANEL BY RT-PCR (FLU A&B, COVID) ARPGX2
Influenza A by PCR: NEGATIVE
Influenza B by PCR: NEGATIVE
SARS Coronavirus 2 by RT PCR: NEGATIVE

## 2020-11-24 LAB — PROTIME-INR
INR: 5.5 (ref 0.8–1.2)
Prothrombin Time: 49.9 seconds — ABNORMAL HIGH (ref 11.4–15.2)

## 2020-11-24 LAB — BRAIN NATRIURETIC PEPTIDE: B Natriuretic Peptide: 1930.9 pg/mL — ABNORMAL HIGH (ref 0.0–100.0)

## 2020-11-24 LAB — TROPONIN I (HIGH SENSITIVITY)
Troponin I (High Sensitivity): 49 ng/L — ABNORMAL HIGH (ref <18)
Troponin I (High Sensitivity): 50 ng/L — ABNORMAL HIGH (ref <18)

## 2020-11-24 LAB — MAGNESIUM: Magnesium: 1.9 mg/dL (ref 1.7–2.4)

## 2020-11-24 MED ORDER — WARFARIN - PHARMACIST DOSING INPATIENT: Freq: Every day | Status: DC

## 2020-11-24 MED ORDER — METOPROLOL SUCCINATE ER 25 MG PO TB24
25.0000 mg | ORAL_TABLET | Freq: Every day | ORAL | Status: DC
  Administered 2020-11-25: 25 mg via ORAL
  Filled 2020-11-24: qty 1

## 2020-11-24 MED ORDER — SODIUM CHLORIDE 0.9% FLUSH
3.0000 mL | Freq: Two times a day (BID) | INTRAVENOUS | Status: DC
  Administered 2020-11-25 – 2020-11-30 (×9): 3 mL via INTRAVENOUS

## 2020-11-24 MED ORDER — FUROSEMIDE 10 MG/ML IJ SOLN
40.0000 mg | Freq: Every day | INTRAMUSCULAR | Status: DC
  Administered 2020-11-25: 40 mg via INTRAVENOUS
  Filled 2020-11-24: qty 4

## 2020-11-24 MED ORDER — PROGESTERONE MICRONIZED 200 MG PO CAPS
200.0000 mg | ORAL_CAPSULE | Freq: Every day | ORAL | Status: DC
  Filled 2020-11-24: qty 1

## 2020-11-24 MED ORDER — ZOLPIDEM TARTRATE 5 MG PO TABS
5.0000 mg | ORAL_TABLET | Freq: Every day | ORAL | Status: DC
  Administered 2020-11-24 – 2020-11-29 (×6): 5 mg via ORAL
  Filled 2020-11-24 (×6): qty 1

## 2020-11-24 MED ORDER — PROGESTERONE 200 MG PO CAPS
200.0000 mg | ORAL_CAPSULE | Freq: Every day | ORAL | Status: DC
  Administered 2020-11-25 – 2020-11-29 (×6): 200 mg via ORAL
  Filled 2020-11-24 (×7): qty 1

## 2020-11-24 MED ORDER — METOPROLOL TARTRATE 5 MG/5ML IV SOLN
10.0000 mg | Freq: Once | INTRAVENOUS | Status: AC
  Administered 2020-11-24: 10 mg via INTRAVENOUS
  Filled 2020-11-24: qty 10

## 2020-11-24 MED ORDER — SODIUM CHLORIDE 0.9% FLUSH: 3.0000 mL | INTRAVENOUS | Status: DC | PRN

## 2020-11-24 MED ORDER — METHIMAZOLE 5 MG PO TABS
5.0000 mg | ORAL_TABLET | Freq: Every day | ORAL | Status: DC
  Administered 2020-11-25 – 2020-11-30 (×6): 5 mg via ORAL
  Filled 2020-11-24 (×6): qty 1

## 2020-11-24 MED ORDER — FUROSEMIDE 10 MG/ML IJ SOLN
40.0000 mg | Freq: Once | INTRAMUSCULAR | Status: AC
  Administered 2020-11-24: 40 mg via INTRAVENOUS
  Filled 2020-11-24: qty 4

## 2020-11-24 MED ORDER — ACETAMINOPHEN 650 MG RE SUPP: 650.0000 mg | Freq: Four times a day (QID) | RECTAL | Status: DC | PRN

## 2020-11-24 MED ORDER — NITROGLYCERIN 0.4 MG SL SUBL: 0.4000 mg | SUBLINGUAL_TABLET | SUBLINGUAL | Status: DC | PRN

## 2020-11-24 MED ORDER — LISINOPRIL 5 MG PO TABS
5.0000 mg | ORAL_TABLET | Freq: Every day | ORAL | Status: DC
  Administered 2020-11-25 – 2020-11-26 (×2): 5 mg via ORAL
  Filled 2020-11-24 (×2): qty 1

## 2020-11-24 MED ORDER — SODIUM CHLORIDE 0.9 % IV SOLN: 250.0000 mL | INTRAVENOUS | Status: DC | PRN

## 2020-11-24 MED ORDER — ACETAMINOPHEN 325 MG PO TABS: 650.0000 mg | ORAL_TABLET | Freq: Four times a day (QID) | ORAL | Status: DC | PRN

## 2020-11-24 NOTE — ED Provider Notes (Signed)
[facility] EMERGENCY DEPARTMENT Provider Note   CSN: [id number] Arrival date & time: 11/24/20  1609     History Chief Complaint  Patient presents with   Shortness of Breath    Nicole Gibbs is a 75 y.o. female.  75 yo F with a cc of sob.  Going on for past week.  Cough, leg swelling. No fevers, no chest pain.  EMS was concerned for atrial fibrillation with RVR, no history of the same per the patient.  Denies abdominal pain denies nausea vomiting or diarrhea.  Has not been on her Lasix.  Has had a high Coumadin level done recently.  The history is provided by the patient.  Shortness of Breath Severity:  Moderate Onset quality:  Gradual Duration:  1 week Timing:  Constant Progression:  Worsening Chronicity:  New Context: activity   Relieved by:  Nothing Worsened by:  Nothing Ineffective treatments:  None tried Associated symptoms: cough   Associated symptoms: no chest pain, no fever, no headaches, no sputum production, no vomiting and no wheezing   Risk factors: hx of PE/DVT and obesity       Past Medical History:  Diagnosis Date   Anxiety    Arthritis    knees   Chronic combined systolic and diastolic CHF (congestive heart failure) ([place])    a. 07/2012 Echo: EF 55-60%, Gr1 DD;  b. 06/2015 Echo: EF 35-40%, triv AI, Mod MR, mod dil LA, mildly reduced RV.   Depression    DVT, bilateral lower limbs ([place])    H/O cardiovascular stress test    a. 07/2012 MV: fixed mid-dist anterior and apical defect - scar vs attenuation, distal ant HK, no reversibility, EF 51%-->low risk.   Hx of blood clots    Hypertensive heart disease    Hypothyroidism    Joint pain    Nocturia    Normal coronary arteries    by cardiac catheterization 09/03/15   Obese    OSA on CPAP    Postmenopausal vaginal bleeding    Pulmonary embolism, bilateral (HCC)    a. chronic coumadin.   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 01/07/2020   Class 3 severe obesity with serious comorbidity and body mass index (BMI) of 40.0 to 44.9 in adult [facility]) 01/07/2020   OSA (obstructive sleep apnea) 02/13/2019   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 01/22/2019   Insomnia 01/22/2019   Abnormal stress test    Systolic CHF, chronic ([place]) 07/06/2015   Acute on chronic combined systolic and diastolic congestive heart failure, NYHA class 3 ([place])    Symptomatic anemia 07/03/2015   Thrombocytosis 07/03/2015   Hypertensive heart disease    Chronic combined systolic and diastolic CHF (congestive heart failure) (HCC)    Cough    GI bleed 05/06/2015   Acute GI bleeding 05/06/2015   Acute blood loss anemia 05/06/2015   History of pulmonary embolism  05/06/2015   Long term (current) use of anticoagulants 08/20/2012   Deep vein thrombosis (DVT) of both lower extremities ([place]) 08/20/2012   Vaginal bleeding- s/p urgent D&C Feb 5th 2014 08/08/2012   Obesity, unspecified 08/08/2012   Dyspnea on exertion 08/08/2012   DVT, bilat - office dopplers 08/24/2012 August 24, 2012   Pulmonary embolism, Bilateral 2012/08/24   Essential hypertension 08/24/12   Nonspecific abnormal  (EKG), negative nucluear stress test 08-24-12 08-24-2012   Family history of coronary artery bypass surgery Aug 24, 2012   Family history of sudden death (sister) 24-Aug-2012    Past Surgical History:  Procedure Laterality Date   CARDIAC CATHETERIZATION N/A 09/03/2015   Procedure: Left Heart Cath and Coronary Angiography;  Surgeon:  [name], MD;  Location: [facility];  Service: Cardiovascular;  Laterality: N/A;   CARDIOVASCULAR STRESS TEST  24-Aug-2012  DR HILTY/ DR BERRY   LOW RISK NUCLEAR STUDY/ NO ISCHEMIA/ FIXED MID TO DISTAL ANTERIOR AND APICAL DEFECT MAY REPRESENT SCAR VS BREAST ATTENUATION ARTIFACT/  MILD DISTAL ANTERIOR HYPOKINESIS/ EF 51%   CESAREAN SECTION  1992   COLONOSCOPY N/A 05/08/2015   Procedure: COLONOSCOPY;  Surgeon: Laurence Spates, MD;  Location: [facility];  Service: Endoscopy;  Laterality: N/A;   DILATION AND CURETTAGE OF UTERUS  06/20/2012   Procedure: DILATATION AND CURETTAGE;  Surgeon: [name], MD;  Location: [facility];  Service: Gynecology;  Laterality: N/A;   dilation and evacution  1988   missed ab   [place] N/A 09/04/2012   Procedure: DILATATION & CURETTAGE WITH THERMACHOICE ABLATION;  Surgeon: [name], MD;  Location: [facility];  Service: Gynecology;  Laterality: N/A;   ESOPHAGOGASTRODUODENOSCOPY N/A 05/07/2015   Procedure: ESOPHAGOGASTRODUODENOSCOPY (EGD);  Surgeon: Laurence Spates, MD;  Location: [facility];  Service: Endoscopy;  Laterality: N/A;   HYSTEROSCOPY WITH D & C  03-24-2009   TRANSTHORACIC ECHOCARDIOGRAM  08-08-2012   LVSF NORMAL/ EF 93-23%/  GRADE I DIASTOLIC DYSFUNCTION/ MILD AV AND MV REGURG./  RVSF MILDLY REDUCED     OB History     Gravida  3   Para  1   Term      Preterm      AB      Living         SAB      IAB      Ectopic      Multiple      Live Births              Family History  Problem Relation Age of Onset   Coronary artery disease Father 73   Stroke Father    Diabetes Father    Heart disease Father    Hypertension Mother    Mental illness Mother        anxiety   Thyroid disease Mother    Sudden death Sister     Social History   Tobacco Use   Smoking status: Never   Smokeless tobacco: Never  Substance Use Topics   Alcohol  use: No   Drug use: No    Home Medications Prior to Admission medications   Medication Sig Start Date End Date Taking? Authorizing Provider  acetaminophen (TYLENOL) 325 MG tablet Take 650 mg by mouth every 6 (six) hours as needed for moderate pain.   Yes [provider]  eszopiclone (LUNESTA) 2 MG TABS tablet Take 1 tablet (2 mg total) by mouth at bedtime as needed. Take immediately before bedtime  10/20/20  Yes Troy Sine, MD  furosemide (LASIX) 40 MG tablet Take 1 tablet by mouth twice daily Patient taking differently: Take 40 mg by mouth daily. 09/15/20  Yes [name], MD  methimazole (TAPAZOLE) 5 MG tablet Take 5 mg by mouth daily.   Yes [provider]  metoprolol succinate (TOPROL XL) 25 MG 24 hr tablet Take 1 tablet (25 mg total) by mouth daily. 06/15/20  Yes Sande Rives E, PA-C  nitroGLYCERIN (NITROSTAT) 0.4 MG SL tablet Place 1 tablet (0.4 mg total) under the tongue every 5 (five) minutes as needed for chest pain. 06/20/14  Yes [name], MD  progesterone (PROMETRIUM) 200 MG capsule Take 200 mg by mouth at bedtime.    Yes [provider]  warfarin (COUMADIN) 3 MG tablet TAKE 1 TO 1 & 1/2 (ONE & ONE-HALF) TABLETS BY MOUTH ONCE DAILY AS DIRECTED Patient taking differently: Take 3-4.5 mg by mouth See admin instructions. 4.5mg  Monday, Wednesday, and Friday evenings and 3mg  Tuesday, Thursday, Saturday, and Sunday evenings. 09/09/20  Yes Sande Rives E, PA-C  lisinopril (ZESTRIL) 5 MG tablet Take 1 tablet (5 mg total) by mouth daily. 11/12/19 02/10/20  [name], MD    Allergies    Keflex [cephalexin] and Sulfa antibiotics  Review of Systems   Review of Systems  Constitutional:  Negative for chills and fever.  HENT:  Negative for congestion and rhinorrhea.   Eyes:  Negative for redness and visual disturbance.  Respiratory:  Positive for cough and shortness of breath. Negative for sputum production and wheezing.   Cardiovascular:   Positive for leg swelling. Negative for chest pain and palpitations.  Gastrointestinal:  Negative for nausea and vomiting.  Genitourinary:  Negative for dysuria and urgency.  Musculoskeletal:  Negative for arthralgias and myalgias.  Skin:  Negative for pallor and wound.  Neurological:  Negative for dizziness and headaches.   Physical Exam Updated Vital Signs BP 116/85   Pulse 74   Temp 98.2 F (36.8 C) (Oral)   Resp (!) 24   Ht 5\' 1"  (1.549 m)   Wt 102.5 kg   SpO2 96%   BMI 42.70 kg/m   Physical Exam Vitals and nursing note reviewed.  Constitutional:      General: She is not in acute distress.    Appearance: She is well-developed. She is not diaphoretic.     Comments: BMI 42  HENT:     Head: Normocephalic and atraumatic.  Eyes:     Pupils: Pupils are equal, round, and reactive to light.  Cardiovascular:     Rate and Rhythm: Normal rate and regular rhythm.     Heart sounds: No murmur heard.   No friction rub. No gallop.  Pulmonary:     Effort: Pulmonary effort is normal. Tachypnea present.     Breath sounds: No wheezing or rales.  Abdominal:     General: There is no distension.     Palpations: Abdomen is soft.     Tenderness: There is no abdominal tenderness.  Musculoskeletal:        General: No tenderness.     Cervical back: Normal range of motion and neck supple.     Right lower leg: Edema present.     Left lower leg: Edema present.     Comments: 2+ pitting edema to bilateral lower extremities up to the thighs  Skin:    General: Skin is warm and dry.  Neurological:     Mental Status: She  is alert and oriented to person, place, and time.  Psychiatric:        Behavior: Behavior normal.    ED Results / Procedures / Treatments   Labs (all labs ordered are listed, but only abnormal results are displayed) Labs Reviewed  CBC WITH DIFFERENTIAL/PLATELET - Abnormal; Notable for the following components:      Result Value   RBC 5.30 (*)    Hemoglobin 15.3 (*)     HCT 49.0 (*)    All other components within normal limits  BASIC METABOLIC PANEL - Abnormal; Notable for the following components:   Glucose, Bld 113 (*)    All other components within normal limits  BRAIN NATRIURETIC PEPTIDE - Abnormal; Notable for the following components:   B Natriuretic Peptide 1,930.9 (*)    All other components within normal limits  PROTIME-INR - Abnormal; Notable for the following components:   Prothrombin Time 49.9 (*)    INR 5.5 (*)    All other components within normal limits  TROPONIN I (HIGH SENSITIVITY) - Abnormal; Notable for the following components:   Troponin I (High Sensitivity) 49 (*)    All other components within normal limits  RESP PANEL BY RT-PCR (FLU A&B, COVID) [id number]    EKG EKG Interpretation  Date/Time:  Tuesday November 24 2020 16:25:09 EDT Ventricular Rate:  129 PR Interval:  129 QRS Duration: 112 QT Interval:  351 QTC Calculation: 515 R Axis:   -19 Text Interpretation: Atrial fibrillation Ventricular premature complex Incomplete left bundle branch block Consider anterior infarct qt calculated normal Otherwise no significant change Confirmed by Deno Etienne [phone]) on 11/24/2020 5:00:41 PM  Radiology DG Chest Port 1 View  Result Date: 11/24/2020 CLINICAL DATA:  Short of breath and weakness EXAM: PORTABLE CHEST 1 VIEW COMPARISON:  07/05/2015 FINDINGS: Stable enlarged cardiac silhouette. Central venous pulmonary congestion. Mild basilar atelectasis. No overt pulmonary edema. No pneumothorax. No focal consolidation. No acute osseous abnormality. IMPRESSION: Cardiomegaly and central venous congestion suggest volume overload. Electronically Signed   By: Suzy Bouchard M.D.   On: 11/24/2020 17:26    Procedures Procedures   Medications Ordered in ED Medications  furosemide (LASIX) injection 40 mg (has no administration in time range)  metoprolol tartrate (LOPRESSOR) injection 10 mg (10 mg Intravenous Given 11/24/20 1656)    ED Course  I  have reviewed the triage vital signs and the nursing notes.  Pertinent labs & imaging results that were available during my care of the patient were reviewed by me and considered in my medical decision making (see chart for details).    MDM Rules/Calculators/A&P                          75 yo F with a chief complaints of shortness of breath and leg edema.  This been going on for the past week.  Also having a cough but no fever.  Clear lung sounds on exam.  Plain film of the chest viewed by me with likely pulmonary edema.  Patient is on Lasix has not been taking it recently.  Old records were reviewed the patient has a history of diminished EF though her most recent echo showed preservation.  We will give a bolus dose of Lasix here.  O2 sat in the upper 80s not on oxygen at home placed on 2 L.  COVID test sent.   The patient also appears to be in atrial fibrillation with RVR.  On record review no  obvious prior diagnosis of this.  She is already on Coumadin.  Supratherapeutic here today.  Given a dose of metoprolol with significant provement of rate.  Will discuss with medicine for admission.  CRITICAL CARE Performed by: Cecilio Asper   Total critical care time: 35 minutes  Critical care time was exclusive of separately billable procedures and treating other patients.  Critical care was necessary to treat or prevent imminent or life-threatening deterioration.  Critical care was time spent personally by me on the following activities: development of treatment plan with patient and/or surrogate as well as nursing, discussions with consultants, evaluation of patient's response to treatment, examination of patient, obtaining history from patient or surrogate, ordering and performing treatments and interventions, ordering and review of laboratory studies, ordering and review of radiographic studies, pulse oximetry and re-evaluation of patient's condition.  Final Clinical Impression(s) / ED  Diagnoses Final diagnoses:  Acute on chronic systolic congestive heart failure ([place])  Acute respiratory failure with hypoxia [facility])    Rx / DC Orders ED Discharge Orders     None        Deno Etienne, DO 11/24/20 1813

## 2020-11-24 NOTE — ED Notes (Signed)
Pt placed on 2 lpm via [place]

## 2020-11-24 NOTE — ED Notes (Signed)
PA Lorriane Shire. Notified of critical INR result.

## 2020-11-24 NOTE — ED Notes (Signed)
Pt given meal bag and water. Repositioned in bed.

## 2020-11-24 NOTE — ED Notes (Signed)
Admitting MD at bedside.

## 2020-11-24 NOTE — Progress Notes (Signed)
ANTICOAGULATION CONSULT NOTE - Initial Consult  Pharmacy Consult for Warfarin Indication: pulmonary embolus  Allergies  Allergen Reactions   Keflex [Cephalexin] Diarrhea   Sulfa Antibiotics Hives    Patient Measurements: Height: 5\' 1"  (154.9 cm) Weight: 102.5 kg (226 lb) IBW/kg (Calculated) : 47.8  Vital Signs: Temp: 98.2 F (36.8 C) (07/12 1627) Temp Source: Oral (07/12 1627) BP: 117/91 (07/12 1845) Pulse Rate: 81 (07/12 1845)  Labs: Recent Labs    11/24/20 1631  HGB 15.3*  HCT 49.0*  PLT 284  LABPROT 49.9*  INR 5.5*  CREATININE 0.95  TROPONINIHS 49*    Estimated Creatinine Clearance: 58 mL/min (by C-G formula based on SCr of 0.95 mg/dL).   Medical History: Past Medical History:  Diagnosis Date   Anxiety    Arthritis    knees   Chronic combined systolic and diastolic CHF (congestive heart failure) ([place])    a. 07/2012 Echo: EF 55-60%, Gr1 DD;  b. 06/2015 Echo: EF 35-40%, triv AI, Mod MR, mod dil LA, mildly reduced RV.   Depression    DVT, bilateral lower limbs ([place])    H/O cardiovascular stress test    a. 07/2012 MV: fixed mid-dist anterior and apical defect - scar vs attenuation, distal ant HK, no reversibility, EF 51%-->low risk.   Hx of blood clots    Hypertensive heart disease    Hypothyroidism    Joint pain    Nocturia    Normal coronary arteries    by cardiac catheterization 09/03/15   Obese    OSA on CPAP    Postmenopausal vaginal bleeding    Pulmonary embolism, bilateral (HCC)    a. chronic coumadin.   Rash    under breasts   Skin cancer    Sleep apnea    SOB (shortness of breath)    Swelling of both lower extremities    Thyroid nodule    no meds    Medications:  (Not in a hospital admission)  Scheduled:   [START ON 11/25/2020] furosemide  40 mg Intravenous Daily   sodium chloride flush  3 mL Intravenous Q12H   [START ON 11/25/2020] Warfarin - Pharmacist Dosing Inpatient   Does not apply q1600   Infusions:   sodium chloride       Assessment: Patient taking warfarin 4.5mg  Monday, Wednesday, and Friday evenings and 3mg  Tuesday, Thursday, Saturday, and Sunday evenings. Last dose prior to arrival 11/23/20.  INR today 5.5.  Goal of Therapy:  INR 2-3 Monitor CBC, PT/INR   Plan:  Hold warfarin today - resume when INR < 3  Heloise Purpura 11/24/2020,7:53 PM

## 2020-11-24 NOTE — ED Triage Notes (Signed)
Pt arrives via [facility] from doctor's office for SOB. EMS reports pt in a-fib, no hx of same. HR 80-130. Pt has hx of CHF, non-compliant with lasix.   EMS last VS - 144/86, HR 80-130 per EMS, 98% on 3 lpm O2 via [place].   #20 L forearm

## 2020-11-24 NOTE — H&P (Signed)
History and Physical    Nicole Gibbs JOA:[id number] DOB: 12-13-1945 DOA: 11/24/2020  PCP: Holland Commons, FNP Consultants:  cardiology: Dr. [name] Patient coming from:  healthy weight and wellness via EMS. Lives at home alone.   Chief Complaint: shortness of breath   HPI: Nicole Gibbs is a 75 y.o. female with medical history significant of HTN, hx of PE and DVTs, systolic and diastolic CHF, OSA, hypothyroidism, prediabetes, anxiety and depression who presented for SOB. She was at her appointment for her Healthy weight and wellness and was so short of breath on exertion they sent her to the ER. She has gained 3 pounds in 2 weeks, had bilateral lower leg swelling, and coughing for the past 2 weeks. She denies any orthopnea. She has not taken her lasix in 3+ weeks. Would take as needed. She denies any fever/chills, chest pain, palpitations. No recent viral URI. She denies any bleeding/bruising.   She does not wear oxygen at home.   ED Course: vitals: afebrile, bp: 144/86, HR: 80-130, 8% on 3L oxygen via [place]. Pertinent labs: Troponin 49, Bnp: 1930, INR: 5.5 CXR: cardiomegaly and central venous congestion suggest volume overload.  EKG afib with RVR (new for her) also requiring oxygen. Given 40 IV lasix in ER. Asked to admit.    Review of Systems: As per HPI; otherwise review of systems reviewed and negative.   Ambulatory Status:  Ambulates with cane   COVID Vaccine Status:  pfizer x2 with 2 boosters.   Past Medical History:  Diagnosis Date   Anxiety    Arthritis    knees   Chronic combined systolic and diastolic CHF (congestive heart failure) ([place])    a. 07/2012 Echo: EF 55-60%, Gr1 DD;  b. 06/2015 Echo: EF 35-40%, triv AI, Mod MR, mod dil LA, mildly reduced RV.   Depression    DVT, bilateral lower limbs ([place])    H/O cardiovascular stress test    a. 07/2012 MV: fixed mid-dist anterior and apical defect - scar vs attenuation, distal ant HK, no reversibility, EF 51%-->low risk.   Hx of  blood clots    Hypertensive heart disease    Hypothyroidism    Joint pain    Nocturia    Normal coronary arteries    by cardiac catheterization 09/03/15   Obese    OSA on CPAP    Postmenopausal vaginal bleeding    Pulmonary embolism, bilateral (HCC)    a. chronic coumadin.   Rash    under breasts   Skin cancer    Sleep apnea    SOB (shortness of breath)    Swelling of both lower extremities    Thyroid nodule    no meds    Past Surgical History:  Procedure Laterality Date   CARDIAC CATHETERIZATION N/A 09/03/2015   Procedure: Left Heart Cath and Coronary Angiography;  Surgeon: [name], MD;  Location: [facility];  Service: Cardiovascular;  Laterality: N/A;   CARDIOVASCULAR STRESS TEST  08-07-2012  DR HILTY/ DR BERRY   LOW RISK NUCLEAR STUDY/ NO ISCHEMIA/ FIXED MID TO DISTAL ANTERIOR AND APICAL DEFECT MAY REPRESENT SCAR VS BREAST ATTENUATION ARTIFACT/  MILD DISTAL ANTERIOR HYPOKINESIS/ EF 51%   CESAREAN SECTION  1992   COLONOSCOPY N/A 05/08/2015   Procedure: COLONOSCOPY;  Surgeon: Laurence Spates, MD;  Location: [facility];  Service: Endoscopy;  Laterality: N/A;   DILATION AND CURETTAGE OF UTERUS  06/20/2012   Procedure: DILATATION AND CURETTAGE;  Surgeon: [name], MD;  Location:  [facility];  Service: Gynecology;  Laterality: N/A;   dilation and evacution  1988   missed ab   [place] N/A 09/04/2012   Procedure: DILATATION & CURETTAGE WITH THERMACHOICE ABLATION;  Surgeon: [name], MD;  Location: [facility];  Service: Gynecology;  Laterality: N/A;   ESOPHAGOGASTRODUODENOSCOPY N/A 05/07/2015   Procedure: ESOPHAGOGASTRODUODENOSCOPY (EGD);  Surgeon: Laurence Spates, MD;  Location: [facility];  Service: Endoscopy;  Laterality: N/A;   HYSTEROSCOPY WITH D & C  03-24-2009   TRANSTHORACIC ECHOCARDIOGRAM  08-08-2012   LVSF NORMAL/ EF 02-72%/  GRADE I DIASTOLIC DYSFUNCTION/ MILD AV AND MV  REGURG./  RVSF MILDLY REDUCED    Social History   Socioeconomic History   Marital status: Widowed    Spouse name: Not on file   Number of children: Not on file   Years of education: Not on file   Highest education level: Not on file  Occupational History   Occupation: retired  Tobacco Use   Smoking status: Never   Smokeless tobacco: Never  Substance and Sexual Activity   Alcohol use: No   Drug use: No   Sexual activity: Not on file  Other Topics Concern   Not on file  Social History Narrative   Not on file   Social Determinants of Health   Financial Resource Strain: Not on file  Food Insecurity: Not on file  Transportation Needs: Not on file  Physical Activity: Not on file  Stress: Not on file  Social Connections: Not on file  Intimate Partner Violence: Not on file    Allergies  Allergen Reactions   Keflex [Cephalexin] Diarrhea   Sulfa Antibiotics Hives    Family History  Problem Relation Age of Onset   Coronary artery disease Father 87   Stroke Father    Diabetes Father    Heart disease Father    Hypertension Mother    Mental illness Mother        anxiety   Thyroid disease Mother    Sudden death Sister     Prior to Admission medications   Medication Sig Start Date End Date Taking? Authorizing Provider  acetaminophen (TYLENOL) 325 MG tablet Take 650 mg by mouth every 6 (six) hours as needed for moderate pain.   Yes [provider]  eszopiclone (LUNESTA) 2 MG TABS tablet Take 1 tablet (2 mg total) by mouth at bedtime as needed. Take immediately before bedtime 10/20/20  Yes Troy Sine, MD  furosemide (LASIX) 40 MG tablet Take 1 tablet by mouth twice daily Patient taking differently: Take 40 mg by mouth daily. 09/15/20  Yes [name], MD  methimazole (TAPAZOLE) 5 MG tablet Take 5 mg by mouth daily.   Yes [provider]  metoprolol succinate (TOPROL XL) 25 MG 24 hr tablet Take 1 tablet (25 mg total) by mouth daily. 06/15/20  Yes  Sande Rives E, PA-C  nitroGLYCERIN (NITROSTAT) 0.4 MG SL tablet Place 1 tablet (0.4 mg total) under the tongue every 5 (five) minutes as needed for chest pain. 06/20/14  Yes [name], MD  progesterone (PROMETRIUM) 200 MG capsule Take 200 mg by mouth at bedtime.    Yes [provider]  warfarin (COUMADIN) 3 MG tablet TAKE 1 TO 1 & 1/2 (ONE & ONE-HALF) TABLETS BY MOUTH ONCE DAILY AS DIRECTED Patient taking differently: Take 3-4.5 mg by mouth See admin instructions. 4.5mg  Monday, Wednesday, and Friday evenings and 3mg  Tuesday, Thursday, Saturday, and Sunday  evenings. 09/09/20  Yes Sande Rives E, PA-C  lisinopril (ZESTRIL) 5 MG tablet Take 1 tablet (5 mg total) by mouth daily. 11/12/19 02/10/20  [name], MD    Physical Exam: Vitals:   11/24/20 1745 11/24/20 1800 11/24/20 1830 11/24/20 1845  BP: (!) 112/94 116/85 120/87 (!) 117/91  Pulse:  74 78 81  Resp: (!) 24 (!) 24 (!) 25   Temp:      TempSrc:      SpO2: 98% 96% 95% 95%  Weight:      Height:         General:  Appears calm and comfortable and is in NAD. Mildly dyspneic with talking Eyes:  PERRL, EOMI, normal lids, iris ENT:  grossly normal hearing, lips & tongue, mmm; appropriate dentition Neck:  no LAD, masses or thyromegaly; no carotid bruits Cardiovascular:  irregularly irregular, no m/r/g. Pedal edema  Respiratory:   crackles in LLL.  Mildly dyspneic with talking  Abdomen:  soft, NT, ND, NABS Back:   normal alignment, no CVAT Skin:  no rash or induration seen on limited exam Musculoskeletal:  grossly normal tone BUE/BLE, good ROM, no bony abnormality Lower extremity:  Limited foot exam with no ulcerations.  2+ distal pulses. Psychiatric:  grossly normal mood and affect, speech fluent and appropriate, AOx3 Neurologic:  CN 2-12 grossly intact, moves all extremities in coordinated fashion, sensation intact    Radiological Exams on Admission: Independently reviewed - see discussion in A/P where  applicable  DG Chest Port 1 View  Result Date: 11/24/2020 CLINICAL DATA:  Short of breath and weakness EXAM: PORTABLE CHEST 1 VIEW COMPARISON:  07/05/2015 FINDINGS: Stable enlarged cardiac silhouette. Central venous pulmonary congestion. Mild basilar atelectasis. No overt pulmonary edema. No pneumothorax. No focal consolidation. No acute osseous abnormality. IMPRESSION: Cardiomegaly and central venous congestion suggest volume overload. Electronically Signed   By: Suzy Bouchard M.D.   On: 11/24/2020 17:26    EKG: Independently reviewed. Atrial fibrillation with rate of 129 ; nonspecific ST changes with no evidence of acute ischemia   Labs on Admission: I have personally reviewed the available labs and imaging studies at the time of the admission.  Pertinent labs:  Troponin 49 Bnp: 1930 INR: 5.5 CXR: cardiomegaly and central venous congestion suggest volume overload.   Assessment/Plan Principal Problem:   Acute on chronic combined systolic and diastolic congestive heart failure, NYHA class 3 (HCC) Elevated BNP, with clinical symptoms and findings on xray  -likely secondary to lack of medicine compliance -given 40mg  IV lasix with excellent response: 1L output. Will continue 40mg  daily -continue strict I/O and daily weights -requiring oxygen and will wean as she diureses  -echo pending  -last echo from 06/10/19: EF of 55%. Normal LVF. Grade 1 diastolic dysfunction.  -mag pending.  -continue tele -continue ACE/I/beta blocker  -cards consult if needed in AM  Active Problems: Afib with RVR -new diagnosis for her. Checking TSH -when I saw her seems to be going in and out of NSR -anticoagulated on coumadin -given oral beta blocker by EDP and heart rate better controlled.  -continue home dose of toprol for rate control  -continue tele -echo pending   Elevated troponin -likely from demand ischemia in setting of decompensated heart failure -will trend troponin -no ST elevation and  no chest pain -echo pending     Essential hypertension -well controlled -continue home lisinopril and resume home Toprol if EF preserved.     History of pulmonary embolism -chronically anticoagulated.  -INR  of 5.5, pharmacy consulted to manage coumadin    Prediabetes -a1c 5.8. continue to encourage weight loss and healthy diet.     Insomnia Continue sleep aid     OSA Can not tolerate cpap  Other specified hypothyroidism -TSH pending -continue home medication     Body mass index is 42.7 kg/m.   Level of care: Telemetry Cardiac DVT prophylaxis: coumadin Code Status:  Full - confirmed with patient Family Communication: None present Disposition Plan:  The patient is from: home  Anticipated d/c is to: home without [facility] services once her cardiology issues have been resolved. Consults called: cardiology   Admission status:  inpatient    Orma Flaming MD [facility]   How to contact the [facility] Attending or Consulting provider [place] or covering provider during after hours [place], for this patient?  Check the care team in [facility] and look for a) attending/consulting TRH provider listed and b) the [facility] team listed Log into [email or web address] and use [place]'s universal password to access. If you do not have the password, please contact the hospital operator. Locate the [facility] provider you are looking for under [facility] and page to a number that you can be directly reached. If you still have difficulty reaching the provider, please page the [facility] (Director on Call) for the Hospitalists listed on amion for assistance.   11/24/2020, 7:57 PM

## 2020-11-25 ENCOUNTER — Inpatient Hospital Stay (HOSPITAL_COMMUNITY): Payer: Medicare Other

## 2020-11-25 DIAGNOSIS — I5033 Acute on chronic diastolic (congestive) heart failure: Secondary | ICD-10-CM

## 2020-11-25 DIAGNOSIS — I5023 Acute on chronic systolic (congestive) heart failure: Secondary | ICD-10-CM

## 2020-11-25 DIAGNOSIS — Z86711 Personal history of pulmonary embolism: Secondary | ICD-10-CM

## 2020-11-25 DIAGNOSIS — I10 Essential (primary) hypertension: Secondary | ICD-10-CM

## 2020-11-25 DIAGNOSIS — I471 Supraventricular tachycardia: Secondary | ICD-10-CM

## 2020-11-25 DIAGNOSIS — I5043 Acute on chronic combined systolic (congestive) and diastolic (congestive) heart failure: Secondary | ICD-10-CM | POA: Diagnosis not present

## 2020-11-25 LAB — ECHOCARDIOGRAM COMPLETE
Area-P 1/2: 3.03 cm2
Height: 61 in
P 1/2 time: 419 msec
S' Lateral: 4.9 cm
Weight: 3673.6 oz

## 2020-11-25 LAB — BASIC METABOLIC PANEL
Anion gap: 8 (ref 5–15)
BUN: 15 mg/dL (ref 8–23)
CO2: 25 mmol/L (ref 22–32)
Calcium: 8.8 mg/dL — ABNORMAL LOW (ref 8.9–10.3)
Chloride: 107 mmol/L (ref 98–111)
Creatinine, Ser: 0.95 mg/dL (ref 0.44–1.00)
GFR, Estimated: >60 mL/min (ref >60)
Glucose, Bld: 106 mg/dL — ABNORMAL HIGH (ref 70–99)
Potassium: 3.6 mmol/L (ref 3.5–5.1)
Sodium: 140 mmol/L (ref 135–145)

## 2020-11-25 LAB — PROTIME-INR
INR: 4.7 (ref 0.8–1.2)
Prothrombin Time: 44.4 seconds — ABNORMAL HIGH (ref 11.4–15.2)

## 2020-11-25 LAB — TSH: TSH: 2.416 u[IU]/mL (ref 0.350–4.500)

## 2020-11-25 LAB — TROPONIN I (HIGH SENSITIVITY): Troponin I (High Sensitivity): 65 ng/L — ABNORMAL HIGH (ref <18)

## 2020-11-25 MED ORDER — METOPROLOL SUCCINATE ER 25 MG PO TB24
25.0000 mg | ORAL_TABLET | Freq: Once | ORAL | Status: AC
  Administered 2020-11-25: 25 mg via ORAL
  Filled 2020-11-25: qty 1

## 2020-11-25 MED ORDER — PERFLUTREN LIPID MICROSPHERE
1.0000 mL | INTRAVENOUS | Status: AC | PRN
Start: 2020-11-25 — End: 2020-11-25
  Administered 2020-11-25: 2 mL via INTRAVENOUS
  Filled 2020-11-25: qty 10

## 2020-11-25 MED ORDER — FUROSEMIDE 10 MG/ML IJ SOLN
40.0000 mg | Freq: Two times a day (BID) | INTRAMUSCULAR | Status: DC
  Administered 2020-11-25 – 2020-11-27 (×5): 40 mg via INTRAVENOUS
  Filled 2020-11-25 (×7): qty 4

## 2020-11-25 MED ORDER — METOPROLOL SUCCINATE ER 50 MG PO TB24
50.0000 mg | ORAL_TABLET | Freq: Every day | ORAL | Status: DC
  Administered 2020-11-26 – 2020-11-29 (×4): 50 mg via ORAL
  Filled 2020-11-25 (×4): qty 1

## 2020-11-25 MED ORDER — POTASSIUM CHLORIDE CRYS ER 20 MEQ PO TBCR
40.0000 meq | EXTENDED_RELEASE_TABLET | Freq: Once | ORAL | Status: AC
  Administered 2020-11-25: 40 meq via ORAL
  Filled 2020-11-25: qty 2

## 2020-11-25 NOTE — Progress Notes (Addendum)
PROGRESS NOTE    Nicole Gibbs   XTG:[id number]  DOB: 1946-01-15  PCP: Holland Commons, FNP    DOA: 11/24/2020 LOS: 1   Assessment & Plan   Principal Problem:   Acute on chronic combined systolic and diastolic congestive heart failure, NYHA class 3 (HCC) Active Problems:   Essential hypertension   History of pulmonary embolism   Insomnia   OSA on CPAP   Other specified hypothyroidism   Prediabetes   Acute on chronic combined systolic and diastolic congestive heart failure, NYHA class 3 - presented with elevated BNP, with clinical symptoms and findings on xray c/w CHF. Likely due to lack of medicine compliance, had not been taking Lasix recently. Prior Echo Jan 2021 with preserved EF, grade 1 DD. Excellent diuresis with IV Lasix started on admission. --Cardiology consulted, see their recs --Follow up pending Echo --Strict I/O's & daily weights --IV Lasix --O2 PRN, keeps sat >90% --Telemetry --Continue ACEI, beta blocker   ?Afib with RVR vs sinus tachycardia w/arrhythmia - pt reports very remote hx of possible A-fib many years ago.  TSH normal.  Likely triggered by CHF.   --anticoagulated on coumadin --given PO beta blocker by EDP and HR improved  --continue home dose of toprol for rate control --Telemetry monitoring --Follow up on echo  --Cardiology consulted   Elevated troponin / Demand ischemia - in setting of decompensated CHF.  Troponin trend flat and patient not having chest pain.  No acute ischemic EKG changes. --Echo pending    Essential hypertension - chronic, well controlled. --Continue home lisinopril  --Resume home Toprol if EF preserved.    History of pulmonary embolism & Bilateral DVT's -  -chronically anticoagulated on warfarin  Supratherapeutic INR - POA with INR 5.5. --hold warfarin --daily INR's --pharmacy consulted for dosing when resumed   Prediabetes - A1c 5.8%.  Continue efforts at weight loss and healthy diet.  Will monitor fasting  glucose for now.   Insomnia - Continue sleep aid    OSA - reports she does not tolerate CPAP   Hyperthyroidism - continue home methimazole TSH within normal 2.416.    Morbid obesity: Body mass index is 43.38 kg/m.  Complicates overall care and prognosis.  Recommend lifestyle modifications including physical activity and diet for weight loss and overall long-term health. --Patient attends a wt loss program in community, encouraged to continue this   DVT prophylaxis:    Diet:  Diet Orders (From admission, onward)     Start     Ordered   11/24/20 1954  Diet Heart Room service appropriate? Yes; Fluid consistency: Thin  Diet effective now       Question Answer Comment  Room service appropriate? Yes   Fluid consistency: Thin      11/24/20 1954              Code Status: Full Code   Brief Narrative / Hospital Course to Date:   75 y.o. female with medical history significant of HTN, hx of PE and DVTs, systolic and diastolic CHF, OSA, hypothyroidism, prediabetes, anxiety and depression who presented for SOB, wt gain, increased lower extremity swelling and cough.  Sent to ER from her Healthy weight and wellness appointment when noted to be severely short of breath.  Admitted for acute on chronic combined CHF and A-fib with RVR.  Cardiology consulted.   Subjective 11/25/20    Pt reports high urine output with IV lasix so far.  States she had "let herself go" and was not  taking her Lasix .  Breathing little better but has not been up moving around much.  No other acute complaints. Thinks she may have remotely been told of A-fib at one time.   Disposition Plan & Communication   Status is: Inpatient  Remains inpatient appropriate because: On IV diuresis for decompensated CHF; ongoing diagnostic evaluation and cardiology consult.  Dispo: The patient is from: Home              Anticipated d/c is to: Home              Patient currently is not medically stable to d/c.    Difficult to place patient No   Consults, Procedures, Significant Events   Consultants:  Cardiology  Procedures:  None  Antimicrobials:  Anti-infectives (From admission, onward)    None         Micro    Objective   Vitals:   11/25/20 0010 11/25/20 0332 11/25/20 0940 11/25/20 1212  BP: (!) 152/99 (!) 143/95 (!) 123/109 (!) 117/93  Pulse: 97 (!) 101  (!) 108  Resp:  20 18 18   Temp:  98.2 F (36.8 C) 98.2 F (36.8 C)   TempSrc:  Oral Oral   SpO2:  96% 98% 93%  Weight:  104.1 kg    Height:        Intake/Output Summary (Last 24 hours) at 11/25/2020 1353 Last data filed at 11/25/2020 1313 Gross per 24 hour  Intake 480 ml  Output 2700 ml  Net -2220 ml   Filed Weights   11/24/20 1625 11/25/20 0332  Weight: 102.5 kg 104.1 kg    Physical Exam:  General exam: awake, alert, no acute distress, obese HEENT: moist mucus membranes, hearing grossly normal  Respiratory system: CTAB, no wheezes, rales or rhonchi, normal respiratory effort. Cardiovascular system: normal S1/S2, RRR,extensive b/l LE edema.   Central nervous system: A&O x4. no gross focal neurologic deficits, normal speech Extremities: moves all, b/l LE edema, normal tone Psychiatry: normal mood, congruent affect, judgement and insight appear normal  Labs   Data Reviewed: I have personally reviewed following labs and imaging studies  CBC: Recent Labs  Lab 11/24/20 1631  WBC 9.2  NEUTROABS 5.9  HGB 15.3*  HCT 49.0*  MCV 92.5  PLT 381   Basic Metabolic Panel: Recent Labs  Lab 11/24/20 1631 11/24/20 1956 11/25/20 0457  NA 140  --  140  K 4.4  --  3.6  CL 109  --  107  CO2 24  --  25  GLUCOSE 113*  --  106*  BUN 16  --  15  CREATININE 0.95  --  0.95  CALCIUM 9.4  --  8.8*  MG  --  1.9  --    GFR: Estimated Creatinine Clearance: 58.5 mL/min (by C-G formula based on SCr of 0.95 mg/dL). Liver Function Tests: No results for input(s): AST, ALT, ALKPHOS, BILITOT, PROT, ALBUMIN in the last  168 hours. No results for input(s): LIPASE, AMYLASE in the last 168 hours. No results for input(s): AMMONIA in the last 168 hours. Coagulation Profile: Recent Labs  Lab 11/24/20 1631 11/25/20 0457  INR 5.5* 4.7*   Cardiac Enzymes: No results for input(s): CKTOTAL, CKMB, CKMBINDEX, TROPONINI in the last 168 hours. BNP (last 3 results) No results for input(s): PROBNP in the last 8760 hours. HbA1C: No results for input(s): HGBA1C in the last 72 hours. CBG: No results for input(s): GLUCAP in the last 168 hours. Lipid Profile: No results for  input(s): CHOL, HDL, LDLCALC, TRIG, CHOLHDL, LDLDIRECT in the last 72 hours. Thyroid Function Tests: Recent Labs    11/25/20 0457  TSH 2.416   Anemia Panel: No results for input(s): VITAMINB12, FOLATE, FERRITIN, TIBC, IRON, RETICCTPCT in the last 72 hours. Sepsis Labs: No results for input(s): PROCALCITON, LATICACIDVEN in the last 168 hours.  Recent Results (from the past 240 hour(s))  Resp Panel by RT-PCR (Flu A&B, Covid) Nasopharyngeal Swab     Status: None   Collection Time: 11/24/20  6:09 PM   Specimen: Nasopharyngeal Swab; Nasopharyngeal(NP) swabs in vial transport medium  Result Value Ref Range Status   SARS Coronavirus 2 by RT PCR NEGATIVE NEGATIVE Final    Comment: (NOTE) SARS-CoV-2 target nucleic acids are NOT DETECTED.  The SARS-CoV-2 RNA is generally detectable in upper respiratory specimens during the acute phase of infection. The lowest concentration of SARS-CoV-2 viral copies this assay can detect is 138 copies/mL. A negative result does not preclude SARS-Cov-2 infection and should not be used as the sole basis for treatment or other patient management decisions. A negative result may occur with  improper specimen collection/handling, submission of specimen other than nasopharyngeal swab, presence of viral mutation(s) within the areas targeted by this assay, and inadequate number of viral copies(<138 copies/mL). A  negative result must be combined with clinical observations, patient history, and epidemiological information. The expected result is Negative.  Fact Sheet for Patients:  EntrepreneurPulse.com.au  Fact Sheet for Healthcare Providers:  IncredibleEmployment.be  This test is no t yet approved or cleared by the [place] FDA and  has been authorized for detection and/or diagnosis of SARS-CoV-2 by FDA under an Emergency Use Authorization (EUA). This EUA will remain  in effect (meaning this test can be used) for the duration of the COVID-19 declaration under Section 564(b)(1) of the Act, 21 U.S.C.section 360bbb-3(b)(1), unless the authorization is terminated  or revoked sooner.       Influenza A by PCR NEGATIVE NEGATIVE Final   Influenza B by PCR NEGATIVE NEGATIVE Final    Comment: (NOTE) The Xpert Xpress SARS-CoV-2/FLU/RSV plus assay is intended as an aid in the diagnosis of influenza from Nasopharyngeal swab specimens and should not be used as a sole basis for treatment. Nasal washings and aspirates are unacceptable for Xpert Xpress SARS-CoV-2/FLU/RSV testing.  Fact Sheet for Patients: EntrepreneurPulse.com.au  Fact Sheet for Healthcare Providers: IncredibleEmployment.be  This test is not yet approved or cleared by the [place] FDA and has been authorized for detection and/or diagnosis of SARS-CoV-2 by FDA under an Emergency Use Authorization (EUA). This EUA will remain in effect (meaning this test can be used) for the duration of the COVID-19 declaration under Section 564(b)(1) of the Act, 21 U.S.C. section 360bbb-3(b)(1), unless the authorization is terminated or revoked.  Performed at [facility], [place] [address] [place], [place] [address]       Imaging Studies   DG Chest Port 1 View  Result Date: 11/24/2020 CLINICAL DATA:  Short of breath and weakness EXAM: PORTABLE CHEST 1 VIEW  COMPARISON:  07/05/2015 FINDINGS: Stable enlarged cardiac silhouette. Central venous pulmonary congestion. Mild basilar atelectasis. No overt pulmonary edema. No pneumothorax. No focal consolidation. No acute osseous abnormality. IMPRESSION: Cardiomegaly and central venous congestion suggest volume overload. Electronically Signed   By: Suzy Bouchard M.D.   On: 11/24/2020 17:26     Medications   Scheduled Meds:  furosemide  40 mg Intravenous Daily   lisinopril  5 mg Oral Daily  methimazole  5 mg Oral Daily   metoprolol succinate  25 mg Oral Daily   progesterone  200 mg Oral QHS   sodium chloride flush  3 mL Intravenous Q12H   Warfarin - Pharmacist Dosing Inpatient   Does not apply q1600   zolpidem  5 mg Oral QHS   Continuous Infusions:  sodium chloride         LOS: 1 day    Time spent: 30 minutes    Ezekiel Slocumb, DO [facility]  11/25/2020, 1:53 PM      If 7PM-7AM, please contact night-coverage. How to contact the [facility] Attending or Consulting provider [place] or covering provider during after hours [place], for this patient?    Check the care team in [facility] and look for a) attending/consulting TRH provider listed and b) the [facility] team listed Log into [email or web address] and use [place]'s universal password to access. If you do not have the password, please contact the hospital operator. Locate the [facility] provider you are looking for under [facility] and page to a number that you can be directly reached. If you still have difficulty reaching the provider, please page the [facility] (Director on Call) for the Hospitalists listed on amion for assistance.

## 2020-11-25 NOTE — Progress Notes (Signed)
Pt with 7 beats of V tach. Pt asymptomatic and resting in bed. MD paged.

## 2020-11-25 NOTE — Consult Note (Signed)
Cardiology Consultation:   Patient ID: Nicole Gibbs MRN: [id number]; DOB: 10/21/1945  Admit date: 11/24/2020 Date of Consult: 11/25/2020  PCP:  Fatima Sanger, FNP   [facility] Providers Cardiologist:  Nanetta Batty, MD   {  Patient Profile:   Nicole Gibbs is a 75 y.o. 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 Denton Lank.   History of Present Illness:   Nicole Gibbs is a 75 year old female with the above history who is followed by Dr. Allyson Sabal. Patient was referred to Dr. Allyson Sabal in 07/2012 for pre-op evaluation and shortness of breath. During work-up she was found to have bilateral DVTs and multiple PEs. She was admitted and placed on Lovenox and Coumadin. Echo at that time showed normal LV systolic function and mild RV dysfunction. A follow-up chest CTA showed complete resolution of PE but did have an incidentally noted multinodular goiter. Follow-up lower extremity dopplers showed resolution of DVTs. She continued to have shortness of breath. Echo in 06/2015 showed LVEF of 35-40% with moderate MR. Myoview in 07/2015 showed a large fixed anteroseptal, apical, and inferolateral perfusion defects suggestive of scar with no reversible ischemia. She ended up undergoing cardiac catheterization in 08/2015 which showed normal coronaries. Most recent Echo in 05/2019 showed improvement in LVEF to 55% with grade 1 diastolic dysfunction. Only trivial MR noted after improvement in EF. Patient was last seen by Dr. Allyson Sabal on 10/14/2020 at which time she reported continued shortness of breath  which is a chronic issues but this was stable.  Patient presented to the ED on 11/24/2020 via EMS for further evaluation of shortness of breath. Patient has chronic dyspnea on exertion but reports worsening of this about 4 weeks ago shortly after last visit with Dr. Allyson Sabal. However, this has gotten significant worse the last 2 weeks to the point she can only walk about 3 steps at a time. She denies any shortness of breath though. No orthopnea or PND. She reports worsening lower extremity over the last 3 days. She also reports a 3lb weight gain since last visit with Dr. Allyson Sabal. She denies any chest pain or palpitations. She reports dizziness over the last couple of weeks when she is laying in bed and turns to one size. This sound more like vertigo. She has had a dry cough for the past 2 weeks but denies any recent fevers or illness. No abnormal bleeding in urine or stools.  Upon arrival to the ED, patient tachycardic but vitals stable. EKG automatically read as atrial fibrillation but on my personal review looks like sinus tachycardia with no acute ischemic changes compared to prior tracing.  High-sensitivity troponin mildly elevated and flat at 49 >> 50 >> 65. BNP elevated at 1,930. Chest x-ray showed cardiomegaly and central venous congestion but no overt edema. WBC 9.2, Hgb 15.3, Plts 284. Na 140, K 4.4, Glucose 113, Creatinine 0.95.  Respiratory panel negative for COVID. Patient was started on IV Lasix and admitted for acute on chronic CHF.  At the time of this evaluation, patient resting comfortably on 2L of O2 via nasal cannula (not on any O2  at home). She hasn't noticed much improvement in shortness of breath with Lasix yet.   Of note, patient only takes Lasix as needed at home. She has Lasix 40mg  twice daily listed under PTA medications but unclear where this came from. I saw the patient in the office in 05/2020 and at that time patient was only on Lasix 40mg  as needed for weight gain. She was doing well  at that time so no changes were made. Sometime after this it got increased to twice daily but patient is unclear who made this change. At her office visit with Dr. Allyson Sabal last month she asked Dr. Allyson Sabal how often she should be taking this and she was told to take it once daily. However, she has still only been taking it as needed and she has not taken any in the last month.  Past Medical History:  Diagnosis Date   Anxiety    Arthritis    knees   Chronic combined systolic and diastolic CHF (congestive heart failure) (HCC)    a. 07/2012 Echo: EF 55-60%, Gr1 DD;  b. 06/2015 Echo: EF 35-40%, triv AI, Mod MR, mod dil LA, mildly reduced RV.   Depression    DVT, bilateral lower limbs (HCC)    H/O cardiovascular stress test    a. 07/2012 MV: fixed mid-dist anterior and apical defect - scar vs attenuation, distal ant HK, no reversibility, EF 51%-->low risk.   Hx of blood clots    Hypertensive heart disease    Hypothyroidism    Joint pain    Nocturia    Normal coronary arteries    by cardiac catheterization 09/03/15   Obese    OSA on CPAP    Postmenopausal vaginal bleeding    Pulmonary embolism, bilateral (HCC)    a. chronic coumadin.   Rash    under breasts   Skin cancer    Sleep apnea    SOB (shortness of breath)    Swelling of both lower extremities    Thyroid nodule    no meds    Past Surgical History:  Procedure Laterality Date   CARDIAC CATHETERIZATION N/A 09/03/2015   Procedure: Left Heart Cath and Coronary Angiography;  Surgeon: Runell Gess, MD;  Location: [facility] INVASIVE CV LAB;  Service: Cardiovascular;  Laterality: N/A;   CARDIOVASCULAR STRESS TEST  08-07-2012  DR HILTY/ DR BERRY   LOW RISK NUCLEAR STUDY/ NO ISCHEMIA/ FIXED MID TO DISTAL ANTERIOR AND APICAL DEFECT MAY REPRESENT SCAR VS BREAST ATTENUATION ARTIFACT/  MILD DISTAL ANTERIOR HYPOKINESIS/ EF 51%   CESAREAN SECTION  1992   COLONOSCOPY N/A 05/08/2015   Procedure: COLONOSCOPY;  Surgeon: Carman Ching, MD;  Location: [facility]  ENDOSCOPY;  Service: Endoscopy;  Laterality: N/A;   DILATION AND CURETTAGE OF UTERUS  06/20/2012   Procedure: DILATATION AND CURETTAGE;  Surgeon: Jeani Hawking, MD;  Location: [facility] ORS;  Service: Gynecology;  Laterality: N/A;   dilation and evacution  1988   missed ab   DILITATION & CURRETTAGE/HYSTROSCOPY WITH THERMACHOICE ABLATION N/A 09/04/2012   Procedure: DILATATION & CURETTAGE WITH THERMACHOICE ABLATION;  Surgeon: Jeani Hawking, MD;  Location: [facility] [place];  Service: Gynecology;  Laterality: N/A;   ESOPHAGOGASTRODUODENOSCOPY N/A 05/07/2015   Procedure: ESOPHAGOGASTRODUODENOSCOPY (EGD);  Surgeon: Carman Ching, MD;  Location: [facility] ENDOSCOPY;  Service: Endoscopy;  Laterality: N/A;   HYSTEROSCOPY WITH D & C  03-24-2009   TRANSTHORACIC ECHOCARDIOGRAM  08-08-2012   LVSF NORMAL/ EF 55-60%/  GRADE I DIASTOLIC DYSFUNCTION/ MILD AV AND  MV REGURG./  RVSF MILDLY REDUCED     Home Medications:  Prior to Admission medications   Medication Sig Start Date End Date Taking? Authorizing Provider  acetaminophen (TYLENOL) 325 MG tablet Take 650 mg by mouth every 6 (six) hours as needed for moderate pain.   Yes [provider]  eszopiclone (LUNESTA) 2 MG TABS tablet Take 1 tablet (2 mg total) by mouth at bedtime as needed. Take immediately before bedtime 10/20/20  Yes Lennette Bihari, MD  furosemide (LASIX) 40 MG tablet Take 1 tablet by mouth twice daily Patient taking differently: Take 40 mg by mouth daily. 09/15/20  Yes Runell Gess, MD  methimazole (TAPAZOLE) 5 MG tablet Take 5 mg by mouth daily.   Yes [provider]  metoprolol succinate (TOPROL XL) 25 MG 24 hr tablet Take 1 tablet (25 mg total) by mouth daily. 06/15/20  Yes Marjie Skiff E, PA-C  nitroGLYCERIN (NITROSTAT) 0.4 MG SL tablet Place 1 tablet (0.4 mg total) under the tongue every 5 (five) minutes as needed for chest pain. 06/20/14  Yes Runell Gess, MD  progesterone (PROMETRIUM) 200 MG capsule Take 200  mg by mouth at bedtime.    Yes [provider]  warfarin (COUMADIN) 3 MG tablet TAKE 1 TO 1 & 1/2 (ONE & ONE-HALF) TABLETS BY MOUTH ONCE DAILY AS DIRECTED Patient taking differently: No sig reported 09/09/20  Yes Irene Limbo, Kristene Liberati E, PA-C  lisinopril (ZESTRIL) 5 MG tablet Take 1 tablet (5 mg total) by mouth daily. 11/12/19 02/10/20  Runell Gess, MD    Inpatient Medications: Scheduled Meds:  furosemide  40 mg Intravenous Daily   lisinopril  5 mg Oral Daily   methimazole  5 mg Oral Daily   metoprolol succinate  25 mg Oral Daily   progesterone  200 mg Oral QHS   sodium chloride flush  3 mL Intravenous Q12H   Warfarin - Pharmacist Dosing Inpatient   Does not apply q1600   zolpidem  5 mg Oral QHS   Continuous Infusions:  sodium chloride     PRN Meds: sodium chloride, acetaminophen **OR** acetaminophen, nitroGLYCERIN, sodium chloride flush  Allergies:    Allergies  Allergen Reactions   Keflex [Cephalexin] Diarrhea   Sulfa Antibiotics Hives    Social History:   Social History   Socioeconomic History   Marital status: Widowed    Spouse name: Not on file   Number of children: Not on file   Years of education: Not on file   Highest education level: Not on file  Occupational History   Occupation: retired  Tobacco Use   Smoking status: Never   Smokeless tobacco: Never  Substance and Sexual Activity   Alcohol use: No   Drug use: No   Sexual activity: Not on file  Other Topics Concern   Not on file  Social History Narrative   Not on file   Social Determinants of Health   Financial Resource Strain: Not on file  Food Insecurity: Not on file  Transportation Needs: Not on file  Physical Activity: Not on file  Stress: Not on file  Social Connections: Not on file  Intimate Partner Violence: Not on file    Family History:    Family History  Problem Relation Age of Onset   Coronary artery disease Father 77   Stroke Father    Diabetes Father    Heart disease  Father    Hypertension Mother    Mental illness Mother  anxiety   Thyroid disease Mother    Sudden death Sister      ROS:  Please see the history of present illness.  Review of Systems  Constitutional:  Negative for fever and malaise/fatigue.  HENT:  Positive for congestion.   Respiratory:  Positive for cough and shortness of breath. Negative for hemoptysis and sputum production.   Cardiovascular:  Positive for leg swelling. Negative for chest pain, palpitations, orthopnea and PND.  Gastrointestinal:  Negative for blood in stool, melena, nausea and vomiting.  Genitourinary:  Negative for hematuria.  Musculoskeletal:  Negative for myalgias.  Neurological:  Positive for dizziness.  Endo/Heme/Allergies:  Does not bruise/bleed easily.  Psychiatric/Behavioral:  Negative for substance abuse.    Physical Exam/Data:   Vitals:   11/25/20 0010 11/25/20 0332 11/25/20 0940 11/25/20 1212  BP: (!) 152/99 (!) 143/95 (!) 123/109 (!) 117/93  Pulse: 97 (!) 101  (!) 108  Resp:  20 18 18   Temp:  98.2 F (36.8 C) 98.2 F (36.8 C)   TempSrc:  Oral Oral   SpO2:  96% 98% 93%  Weight:  104.1 kg    Height:        Intake/Output Summary (Last 24 hours) at 11/25/2020 1224 Last data filed at 11/25/2020 0949 Gross per 24 hour  Intake 240 ml  Output 2600 ml  Net -2360 ml   Last 3 Weights 11/25/2020 11/24/2020 11/24/2020  Weight (lbs) 229 lb 9.6 oz 226 lb 229 lb  Weight (kg) 104.146 kg 102.513 kg 103.874 kg     Body mass index is 43.38 kg/m.  General: 75 y.o. morbidly obese Caucasian female resting comfortably in no acute distress. HEENT: Normocephalic and atraumatic.  Neck: Supple. No carotid bruits. No JVD. Heart: Tachycardic with irregular rhythm. Distinct S1 and S2. No murmurs, gallops, or rubs. Radial and distal pedal pulses 2+ and equal bilaterally. Lungs: No increased work of breathing. Clear to ausculation bilaterally. No wheezes, rhonchi, or rales.  Abdomen: Soft, non-distended, and  non-tender to palpation.  Extremities: Trace to 1+ pitting edema bilaterally.    Skin: Lower extremity cool to the touch. Upper extremities warm and dry. Neuro: Alert and oriented x3. No focal deficits. Psych: Normal affect. Responds appropriately.  EKG:  The followings EKGs were personally reviewed: - EKG from 11/24/2020 at 16:25 demonstrates: Sinus tachycardia, rate 129, with irregular rhythm (PVC and likely PAC). T waves inversion in I and aVL (seen on prior tracing in 10/2020). Left axis deviation. Incomplete LBBB. - EKG from 11/24/2020 at 19:43 demonstrates: Normal sinus rhythm, rate 94 bpm, with PVCs. Left axis deviation. LBBB. - EKG from 11/24/2020 at 19:44 demonstrates: Sinus tachycardia, rate 106 bpm, with bigeminy PVC. T wave inversions in I and aVL. LBBB.  Telemetry:  Telemetry was personally reviewed and demonstrates:  Sinus tachycardia vs MAT. Rates in the low 100s to 110s. PVC and short runs of NSVT noted (longest run being 7 beats).  Relevant CV Studies:  Cardiac Catheterization 09/03/2015: Impressions: Nicole Gibbs has a nonischemic cardiomyopathy with normal coronary arteries and moderate global LV dysfunction.Continue medical therapy will be recommended. The sheath was removed and a TR band was placed on the right wrist which is patent hemostasis. She will need Lovenox bridging back to Coumadin anticoagulation. She will be discharged home today as an outpatient and I will see her back in 2 weeks. _______________  Echocardiogram 06/10/2019: Impressions: 1. Left ventricular ejection fraction, by visual estimation, is 55%. The  left ventricle has normal function. There is mildly increased  left  ventricular hypertrophy.   2. Left ventricular diastolic parameters are consistent with Grade I  diastolic dysfunction (impaired relaxation).   3. The left ventricle has no regional wall motion abnormalities.   4. Global right ventricle has normal systolic function.The right  ventricular  size is normal. No increase in right ventricular wall  thickness.   5. Left atrial size was mild-moderately dilated.   6. Right atrial size was normal.   7. Mild mitral annular calcification.   8. The mitral valve is normal in structure. Trivial mitral valve  regurgitation. No evidence of mitral stenosis.   9. The tricuspid valve is normal in structure. Tricuspid valve  regurgitation is not demonstrated.  10. The aortic valve is tricuspid. Aortic valve regurgitation is mild.  Mild aortic valve sclerosis without stenosis.  11. The inferior vena cava is normal in size with greater than 50%  respiratory variability, suggesting right atrial pressure of 3 mmHg.  12. TR signal is inadequate for assessing pulmonary artery systolic  pressure.  Laboratory Data:  High Sensitivity Troponin:   Recent Labs  Lab 11/24/20 1631 11/24/20 1956 11/25/20 0457  TROPONINIHS 49* 50* 65*     Chemistry Recent Labs  Lab 11/24/20 1631 11/25/20 0457  NA 140 140  K 4.4 3.6  CL 109 107  CO2 24 25  GLUCOSE 113* 106*  BUN 16 15  CREATININE 0.95 0.95  CALCIUM 9.4 8.8*  GFRNONAA >60 >60  ANIONGAP 7 8    No results for input(s): PROT, ALBUMIN, AST, ALT, ALKPHOS, BILITOT in the last 168 hours. Hematology Recent Labs  Lab 11/24/20 1631  WBC 9.2  RBC 5.30*  HGB 15.3*  HCT 49.0*  MCV 92.5  MCH 28.9  MCHC 31.2  RDW 14.4  PLT 284   BNP Recent Labs  Lab 11/24/20 1631  BNP 1,930.9*    DDimer No results for input(s): DDIMER in the last 168 hours.   Radiology/Studies:  DG Chest Port 1 View  Result Date: 11/24/2020 CLINICAL DATA:  Short of breath and weakness EXAM: PORTABLE CHEST 1 VIEW COMPARISON:  07/05/2015 FINDINGS: Stable enlarged cardiac silhouette. Central venous pulmonary congestion. Mild basilar atelectasis. No overt pulmonary edema. No pneumothorax. No focal consolidation. No acute osseous abnormality. IMPRESSION: Cardiomegaly and central venous congestion suggest volume overload.  Electronically Signed   By: Genevive Bi M.D.   On: 11/24/2020 17:26     Assessment and Plan:   Acute on Chronic Combined CHF Non-Ischemic Cardiomyopathy - History of non-ischemic cardiomyopathy with EF as low as 35-40% in 2017 but improved to 55% on most recent Echo in 05/2019. LHC in 2017 showed normal coronaries. Patient now presents with significant worsening dyspnea on exertion and lower extremity edema. - BNP elevated in the 1,900s.  - Chest x-ray showed cardiomegaly and vascular congestion but no overt edema.  - Updated Echo pending.  - Started on IV Lasix 40mg  daily with good urinary output. Net negative 2.36 L so far. Renal function stable.  - Continue current dose of IV Lasix. - Continue Lisinopril 5mg  daily. - Continue Toprol-XL 25mg  daily. - Continue to monitor daily weights, strict I/O's, and renal function.  Questionable Atrial Fibrillation - There was concern that patient was in atrial fibrillation with RVR on admission.  - Initial EKG automatically read as atrial fibrillation but on my personal review looks like sinus tachycardia with irregular rhythm with PACs and PVCs.  - Reviewed telemetry and looks like sinus tachycardia vs MAT.  - Potassium 3.6 today. Supplemented  by primary team. - Magnesium 1.9 yesterday.  - TSH normal. - Continue Toprol-XL as above. Will discuss increasing this in setting of decompensated CHF with MD. - Will review EKG tracings and telemetry with MD. If it is indeed atrial fibrillation, patient is already on Coumadin for history of bilateral DVT/PE.   Elevated Troponin - High-sensitivity troponin minimally elevated and flat at 49 >> 50 >> 65. - EKG showed no acute ischemic changes.  - Echo pending.  - Not consistent with ACS. Consistent with demand ischemic from acute on chronic CHF. However, further recommendations pending Echo.   Hypertension - BP mildly elevated at time (diastolic BP more so than systolic).  - Continue Lisinopril and  Toprol-XL as above.  - Hopefully, BP will improve with diuresis. If remains elevated, can adjust medications.   History of Bilateral DVT/PEs - Diagnosed in 2014. Repeat imaging showed complete resolution. - Has been maintained on long-term anticoagulation with Coumadin. Continue. Dosing per Pharmacy.   Obstructive Sleep Apnea - Unable to tolerate CPAP in the past.   Otherwise, per primary team: - Hypothyroidism - Insomnia - Prediabets   Risk Assessment/Risk Scores:   New York Heart Association (NYHA) Functional Class NYHA Class III   For questions or updates, please contact [facility] Please consult [email or web address] for contact info under    Signed, Corrin Parker, PA-C  11/25/2020 12:24 PM

## 2020-11-25 NOTE — Progress Notes (Signed)
ANTICOAGULATION CONSULT NOTE - Follow Up Consult  Pharmacy Consult for Warfarin Indication: pulmonary embolus and DVT  Allergies  Allergen Reactions   Keflex [Cephalexin] Diarrhea   Sulfa Antibiotics Hives    Patient Measurements: Height: 5\' 1"  (154.9 cm) Weight: 104.1 kg (229 lb 9.6 oz) IBW/kg (Calculated) : 47.8  Vital Signs: Temp: 98.2 F (36.8 C) (07/13 0940) Temp Source: Oral (07/13 0940) BP: 123/109 (07/13 0940) Pulse Rate: 101 (07/13 0332)  Labs: Recent Labs    11/24/20 1631 11/24/20 1956 11/25/20 0457  HGB 15.3*  --   --   HCT 49.0*  --   --   PLT 284  --   --   LABPROT 49.9*  --  44.4*  INR 5.5*  --  4.7*  CREATININE 0.95  --  0.95  TROPONINIHS 49* 50* 65*    Estimated Creatinine Clearance: 58.5 mL/min (by C-G formula based on SCr of 0.95 mg/dL).   Assessment: 75 year old female presenting with shortness of breath, bilateral lower extremity swelling, and recent weight gain following non compliance with prescribed home lasix. She was coming in for her wellness appointment when they sent her to the emergency department due to her symptoms. Home dose of warfarin is 4.5 mg Monday, Wednesday, and Friday and 3 mg Tuesday, Thursday, Saturday, and Sunday with no recent changes to her home medication or dose. Her last INR on 10/14/20 was elevated at 3.5 at the anticoagulation clinic and on admission on 7/12 her INR was 5.5. Warfarin was held on 7/12, INR was 4.7 on 7/13 and another dose will be held. No signs or symptoms of bleeding.  Goal of Therapy:  INR 2-3   Plan:  Hold warfarin dose 7/13 and resume when INR <3. Monitor daily PT/INR with intermittent CBC. Continue to monitor for signs and symptoms of bleeding   Varney Daily, PharmD PGY1 Pharmacy Resident  Please check AMION for all [facility] pharmacy phone numbers After 10:00 PM call main pharmacy [phone]

## 2020-11-25 NOTE — Progress Notes (Signed)
  Echocardiogram 2D Echocardiogram has been performed.  Nicole Gibbs 11/25/2020, 2:51 PM

## 2020-11-26 ENCOUNTER — Telehealth: Payer: Self-pay

## 2020-11-26 ENCOUNTER — Other Ambulatory Visit (HOSPITAL_COMMUNITY): Payer: Self-pay

## 2020-11-26 DIAGNOSIS — E0590 Thyrotoxicosis, unspecified without thyrotoxic crisis or storm: Secondary | ICD-10-CM | POA: Diagnosis present

## 2020-11-26 DIAGNOSIS — R791 Abnormal coagulation profile: Secondary | ICD-10-CM | POA: Diagnosis present

## 2020-11-26 DIAGNOSIS — I5043 Acute on chronic combined systolic (congestive) and diastolic (congestive) heart failure: Secondary | ICD-10-CM | POA: Diagnosis not present

## 2020-11-26 LAB — PROTIME-INR
INR: 3.4 — ABNORMAL HIGH (ref 0.8–1.2)
Prothrombin Time: 34 seconds — ABNORMAL HIGH (ref 11.4–15.2)

## 2020-11-26 LAB — BASIC METABOLIC PANEL
Anion gap: 8 (ref 5–15)
BUN: 22 mg/dL (ref 8–23)
CO2: 29 mmol/L (ref 22–32)
Calcium: 8.4 mg/dL — ABNORMAL LOW (ref 8.9–10.3)
Chloride: 104 mmol/L (ref 98–111)
Creatinine, Ser: 1.12 mg/dL — ABNORMAL HIGH (ref 0.44–1.00)
GFR, Estimated: 52 mL/min — ABNORMAL LOW (ref >60)
Glucose, Bld: 118 mg/dL — ABNORMAL HIGH (ref 70–99)
Potassium: 3.6 mmol/L (ref 3.5–5.1)
Sodium: 141 mmol/L (ref 135–145)

## 2020-11-26 LAB — MAGNESIUM: Magnesium: 1.7 mg/dL (ref 1.7–2.4)

## 2020-11-26 MED ORDER — METOPROLOL TARTRATE 12.5 MG HALF TABLET
12.5000 mg | ORAL_TABLET | Freq: Once | ORAL | Status: AC
  Administered 2020-11-26: 12.5 mg via ORAL
  Filled 2020-11-26: qty 1

## 2020-11-26 MED ORDER — POTASSIUM CHLORIDE CRYS ER 20 MEQ PO TBCR
40.0000 meq | EXTENDED_RELEASE_TABLET | Freq: Once | ORAL | Status: AC
  Administered 2020-11-26: 40 meq via ORAL
  Filled 2020-11-26: qty 2

## 2020-11-26 MED ORDER — MAGNESIUM SULFATE 2 GM/50ML IV SOLN
2.0000 g | Freq: Once | INTRAVENOUS | Status: AC
  Administered 2020-11-26: 2 g via INTRAVENOUS
  Filled 2020-11-26: qty 50

## 2020-11-26 MED ORDER — WARFARIN SODIUM 3 MG PO TABS
3.0000 mg | ORAL_TABLET | Freq: Once | ORAL | Status: AC
  Administered 2020-11-26: 3 mg via ORAL
  Filled 2020-11-26: qty 1

## 2020-11-26 MED ORDER — LOSARTAN POTASSIUM 25 MG PO TABS
25.0000 mg | ORAL_TABLET | Freq: Every day | ORAL | Status: DC
  Administered 2020-11-27: 25 mg via ORAL
  Filled 2020-11-26: qty 1

## 2020-11-26 NOTE — Assessment & Plan Note (Addendum)
Resolved.  Presented with INR of 5.5.   On warfarin chronically for hx of PE's and bilateral DVT's. --Warfarin resumed, dosing per pharmacy --Daily INR's

## 2020-11-26 NOTE — Assessment & Plan Note (Addendum)
Chronic, due to hx of bilateral DVT's.  Acutely worse with decompensated CHF. Diuresing as outlined.

## 2020-11-26 NOTE — Assessment & Plan Note (Addendum)
presented with elevated BNP, with clinical symptoms and findings on xray c/w CHF. Likely due to lack of medicine compliance, had not been taking Lasix recently. Prior Echo Jan 2021 with preserved EF, grade 1 DD. Excellent diuresis with IV Lasix started on admission. ECHO 11/25/20 -- EF 25-30%, global LV hypokinesis, grade 2 diastolic dysfunction Net IO Since Admission: -6,670 mL [11/28/20 1146] --Cardiology consulted, see their recs --Initially diuresed with IV Lasix at 40 mg BID  --Transition to oral Lasix 40 mg daily today  --Strict I/O's & daily weights --Toprol-XL increased to 100 mg daily --Added Aldactone 25 mg daily, Jardiance 10 mg daily --Entresto started 7/16. --TOC consulted for Entresto discount program / out of pocket cost. --O2 PRN, keeps sat >90% --Telemetry

## 2020-11-26 NOTE — Telephone Encounter (Signed)
Called and lmomed the pt that they are overdue for inr appt and that we need to r/s asap will await call from pt

## 2020-11-26 NOTE — Progress Notes (Signed)
Cardiology notified, Dorene Ar, NP about patient's HR going into the 160's while ambulating with PT. Patient was very short of breath and weak during this episode. HR is back to SR in the 80's and patient has returned to baseline sitting on edge of bed.

## 2020-11-26 NOTE — Assessment & Plan Note (Signed)
A1c 5.8%.  Continue efforts at weight loss and healthy diet. Will monitor fasting glucose for now.

## 2020-11-26 NOTE — Assessment & Plan Note (Signed)
Complicates overall care and prognosis.  Recommend lifestyle modifications including physical activity and diet for weight loss and overall long-term health. --Patient attends a wt loss program in community, encouraged to continue this

## 2020-11-26 NOTE — Assessment & Plan Note (Addendum)
Continue warfarin.

## 2020-11-26 NOTE — Progress Notes (Signed)
Chief Complaint:   OBESITY Nicole Gibbs is here to discuss her progress with her obesity treatment plan along with follow-up of her obesity related diagnoses. Nicole Gibbs is on the Category 2 Plan + 100 calories and states she is following her eating plan approximately 60% of the time. Nicole Gibbs states she is not currently exercising.  Today's visit was #: 18 Starting weight: 237 lbs Starting date: 01/07/2020 Today's weight: 229 lbs Today's date: 11/24/2020 Total lbs lost to date: 8 Total lbs lost since last in-office visit: 0  Interim History: The last few weeks, Nicole Gibbs has had worsening non-productive cough, dyspnea, and fatigue. She states "I take 3 steps then I need to sit because I'm tired." She reports increase in dyspnea. She denies sleeping on several pillows at night.  O2 on RA- 96%- difficulty breathing when speaking noted. She has NOT taken Lasix in the last 48 hours.  She has not followed up with cardiology since last OV.  Subjective:   1. Dyspnea on exertion The last few weeks, Nicole Gibbs has had worsening non-productive cough, dyspnea, and fatigue. She states "I take 3 steps then I need to sit because I'm tired." She reports increase in dyspnea. She denies sleeping on several pillows at night.  O2 on RA- 96%- difficulty breathing when speaking noted. She has NOT taken Lasix in the last 48 hours.  She has not followed up with cardiology since last OV  2. Chronic combined systolic and diastolic CHF (congestive heart failure) (HCC) Worsening dyspnea on exertion and bilateral lower edema. She has not taken Lasix in over 48 hours. Her last contact with cardiology was OV on  10/14/2020.  Assessment/Plan:   1. Dyspnea on exertion Non-emergent transport called- pt was taken to the nearest ED. Pt stable upon transport.  2. 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 Pt stable upon transport.  3.  Class 3 severe obesity with serious comorbidity and body mass index (BMI) of 40.0 to 44.9 in adult, unspecified obesity type [facility])  Nicole Gibbs is currently in the action stage of change. As such, her goal is to continue with weight loss efforts. She has agreed to the Category 2 Plan + 100 calories.   Pt was transported to the nearest ED via non-emergent EMS transport.  Exercise goals: No exercise has been prescribed at this time.  Behavioral modification strategies: decreasing sodium intake.  Nicole Gibbs has agreed to follow-up with our clinic in 4 weeks. She was informed of the importance of frequent follow-up visits to maximize her success with intensive lifestyle modifications for her multiple health conditions.   Objective:   Blood pressure 108/73, pulse (!) 105, temperature (!) 97.5 F (36.4 C), height 5\' 1"  (1.549 m), weight 229 lb (103.9 kg), SpO2 96 %. Body mass index is 43.27 kg/m.  General: Cooperative, alert, Mildly dyspneic with talking HEENT: Conjunctivae and lids unremarkable. Cardiovascular: irregularly irregular, no m/r/g. Pedal edema  Lungs: crackles in LLL.  Mildly dyspneic with talking  Neurologic: No focal deficits.   Lab Results  Component Value Date   CREATININE 1.12 (H) 11/26/2020   BUN 22 11/26/2020   NA 141 11/26/2020   K 3.6 11/26/2020   CL 104 11/26/2020   CO2 29 11/26/2020   Lab Results  Component Value Date   ALT 14 09/28/2020   AST 21 09/28/2020   ALKPHOS 75 09/28/2020   BILITOT 0.7 09/28/2020   Lab Results  Component Value  Date   HGBA1C 5.8 (H) 09/28/2020   HGBA1C 5.9 (H) 06/30/2020   HGBA1C 6.0 (H) 04/01/2020   HGBA1C 6.0 12/16/2019   Lab Results  Component Value Date   INSULIN 11.2 09/28/2020   INSULIN 17.1 06/30/2020   INSULIN 13.8 04/01/2020   Lab Results  Component Value Date   TSH 2.416 11/25/2020   Lab Results  Component Value Date   CHOL 154 12/16/2019   HDL 52 12/16/2019   LDLCALC 84 12/16/2019   TRIG 90 12/16/2019   No  results found for: VD25OH Lab Results  Component Value Date   WBC 9.2 11/24/2020   HGB 15.3 (H) 11/24/2020   HCT 49.0 (H) 11/24/2020   MCV 92.5 11/24/2020   PLT 284 11/24/2020   Lab Results  Component Value Date   IRON 56 10/06/2015   TIBC 244 10/06/2015   FERRITIN 123 10/06/2015    Obesity Behavioral Intervention:   Approximately 15 minutes were spent on the discussion below.  ASK: We discussed the diagnosis of obesity with Nicole Gibbs today and Nicole Gibbs agreed to give [place] permission to discuss obesity behavioral modification therapy today.  ASSESS: Nicole Gibbs has the diagnosis of obesity and her BMI today is 43.3. Nicole Gibbs is in the action stage of change.   ADVISE: Nicole Gibbs was educated on the multiple health risks of obesity as well as the benefit of weight loss to improve her health. She was advised of the need for long term treatment and the importance of lifestyle modifications to improve her current health and to decrease her risk of future health problems.  AGREE: Multiple dietary modification options and treatment options were discussed and Nicole Gibbs agreed to follow the recommendations documented in the above note.  ARRANGE: Nicole Gibbs was educated on the importance of frequent visits to treat obesity as outlined per CMS and USPSTF guidelines and agreed to schedule her next follow up appointment today.  Attestation Statements:   Reviewed by clinician on day of visit: allergies, medications, problem list, medical history, surgical history, family history, social history, and previous encounter notes.  Coral Ceo, CMA, am acting as transcriptionist for Mina Marble, NP.  I have reviewed the above documentation for accuracy and completeness, and I agree with the above. -  Jerred Zaremba d. Jhene Westmoreland, NP-C

## 2020-11-26 NOTE — Assessment & Plan Note (Signed)
CPAP ordered

## 2020-11-26 NOTE — Assessment & Plan Note (Addendum)
Currently with soft BP's in setting of diuresis and addition of cardiac meds for HFrEF. Was taking lisinopril and Toprol PTA, had been noncompliant with Lasix. Now on Toprol-XL, Aldactone, Entresto and Lasix

## 2020-11-26 NOTE — Progress Notes (Signed)
Heart Failure Stewardship Pharmacist Progress Note   PCP: Holland Commons, FNP PCP-Cardiologist: Quay Burow, MD    HPI:  75 yo F with PMH of HTN, PE/DVT, CHF, OSA, hypothyroidism, prediabetes, anxiety and depression. She presented to the ED on 11/24/20 with shortness of breath, weight gain, LE edema, and cough. CXR showed cardiomegaly and volume overload. An ECHO was done on 11/25/20 and LVEF was 25-30% (reduced from 55% from ECHO in January 2021).   Current HF Medications: Furosemide 40 mg IV BID Metoprolol XL 50 mg daily Losartan 25 mg daily  Prior to admission HF Medications: Furosemide 40 mg daily Metoprolol XL 25 mg daily Lisinopril 5 mg daily  Pertinent Lab Values: Serum creatinine 1.12, BUN 22, Potassium 3.6, Sodium 141, BNP 1930.9, Magnesium 1.7  Vital Signs: Weight: 222 lbs (admission weight: 226 lbs) Blood pressure: 110/80s  Heart rate: 90s   Medication Assistance / Insurance Benefits Check: Does the patient have prescription insurance?  Yes Type of insurance plan: Medicare + BCBS supplement  Outpatient Pharmacy:  Prior to admission outpatient pharmacy: [facility] Is the patient willing to use [facility] at discharge? Yes Is the patient willing to transition their outpatient pharmacy to utilize a [facility] outpatient pharmacy?   Pending    Assessment: 1. Acute on chronic systolic CHF (EF 15-17%), due to NICM. NYHA class III symptoms. - Continue furosemide 40 mg IV BID - Continue metoprolol XL 50 mg daily - Lisinopril transitioned to losartan (starting tomorrow). Consider optimizing to [facility] following 36h washout period. Last dose of lisinopril given 7/14 at 0843. - Consider starting spironolactone 12.5 mg daily prior to discharge - Consider starting Farxiga or Jardiance 10 mg daily prior to discharge   Plan: 1) Medication changes recommended at this time: - Agree with changes above  2) Patient assistance: - $480 deductible remaining on  insurance - After deductible is met - Wilder Glade is $19.22 per month, Entresto is $32.96 per month, Jardiance is $23.19 per month  3)  Education  - To be completed prior to discharge  Kerby Nora, PharmD, BCPS Heart Failure Cytogeneticist Phone [phone]

## 2020-11-26 NOTE — Progress Notes (Signed)
ANTICOAGULATION CONSULT NOTE - Follow Up Consult  Pharmacy Consult for Warfarin PTA Indication: pulmonary embolus and DVT  Allergies  Allergen Reactions   Keflex [Cephalexin] Diarrhea   Sulfa Antibiotics Hives    Patient Measurements: Height: 5\' 1"  (154.9 cm) Weight: 100.9 kg (222 lb 6.4 oz) IBW/kg (Calculated) : 47.8  Vital Signs: Temp: 98.5 F (36.9 C) (07/14 0814) Temp Source: Oral (07/14 0814) BP: 112/82 (07/14 0814) Pulse Rate: 42 (07/14 0814)  Labs: Recent Labs    11/24/20 1631 11/24/20 1956 11/25/20 0457 11/26/20 0408  HGB 15.3*  --   --   --   HCT 49.0*  --   --   --   PLT 284  --   --   --   LABPROT 49.9*  --  44.4* 34.0*  INR 5.5*  --  4.7* 3.4*  CREATININE 0.95  --  0.95 1.12*  TROPONINIHS 49* 50* 65*  --     Estimated Creatinine Clearance: 48.7 mL/min (A) (by C-G formula based on SCr of 1.12 mg/dL (H)).   Assessment: 75 year old female presenting with shortness of breath, bilateral lower extremity swelling, and recent weight gain following non compliance with prescribed home lasix. She was coming in for her wellness appointment when they sent her to the emergency department due to her symptoms. Home dose of warfarin is 4.5 mg Monday, Wednesday, and Friday and 3 mg Tuesday, Thursday, Saturday, and Sunday with no recent changes to her home medication or dose. Her last INR on 10/14/20 was elevated at 3.5 at the anticoagulation clinic and on admission on 7/12 her INR was 5.5. Warfarin was held on 7/12, INR was 4.7 on 7/13 with another dose was held. INR 7/14 is 3.4 and will restart her home dose of 3 mg today. INR is still expected to decrease again tomorrow morning. No signs or symptoms of bleeding.  Goal of Therapy:  INR 2-3 Monitor platelets by anticoagulation protocol: Yes   Plan:  - Restart home regimen with 3 mg given 7/14 - Monitor daily PT/INR with intermittent CBC. - Continue to monitor for signs and symptoms of bleeding   Varney Daily,  PharmD PGY1 Pharmacy Resident  Please check AMION for all [facility] pharmacy phone numbers After 10:00 PM call main pharmacy [phone]

## 2020-11-26 NOTE — Assessment & Plan Note (Signed)
Continue sleep aid

## 2020-11-26 NOTE — Assessment & Plan Note (Signed)
TSH within normal this admission. --Continue methimazole.

## 2020-11-26 NOTE — Evaluation (Signed)
Physical Therapy Evaluation Patient Details Name: Nicole Gibbs MRN: [id number] DOB: 1945/10/29 Today's Date: 11/26/2020   History of Present Illness  Pt is 75 yo female admitted with combined CHF and Afib with RVR.  She has hx of HTN, PE and DVTs, CHF, OSA, hypothyroidism, prediabetes, morbid obesity, anxiety and depression.  Clinical Impression  Pt admitted with above diagnosis. At baseline, pt lives alone and is independent with use of cane.  She reports decline in endurance over the past 3 weeks.  Pt has no support at home (dtr lives in [place]) but is well equipped with DME.  Today, pt was able to ambulate 32' with PT using cane and supervision; however, she reported severe fatigue/distress and DOE and quickly returned to room.  While pt did appear to have some DOE did not physically appear to be in the distress that she felt.  Her O2 sats were 100% on RA and HR on pulse ox was 95 bpm; however, spoke with RN afterwards and pt did have 2 runs of HR 160's during ambulation that could have contributed to her feeling distressed.  Pt moved fairly well at supervision level with cane, just limited by cardiopulmonary status.  Largest concern is lack of support at home and a flight of steps to her bedroom. Do expect pt to progress well and likely not need SNF at d/c, but would benefit from [facility]. Pt currently with functional limitations due to the deficits listed below (see PT Problem List). Pt will benefit from skilled PT to increase their independence and safety with mobility to allow discharge to the venue listed below.       Follow Up Recommendations Home health PT;Supervision - Intermittent    Equipment Recommendations  None recommended by PT (well equipped)    Recommendations for Other Services       Precautions / Restrictions Precautions Precautions: Other (comment) Precaution Comments: watch HR      Mobility  Bed Mobility Overal bed mobility: Independent             General bed mobility  comments: EOB upon arrival    Transfers Overall transfer level: Needs assistance   Transfers: Sit to/from Stand Sit to Stand: Supervision         General transfer comment: Supervision during session but pt has been ambulating to bathroom independently  Ambulation/Gait Ambulation/Gait assistance: Supervision Gait Distance (Feet): 80 Feet Assistive device: Straight cane Gait Pattern/deviations: Step-through pattern Gait velocity: normal   General Gait Details: Pt very easily fatigued.  She ambulated on RA.  While ambulating pt suddenly expressing extreme fatigue and needing to return to room.  O2 sats were 100% on RA and HR was at 94 bpm.  However, spoke with RN after session and telemetry had called - pt had 2 runs of HR up to 160's during ambulation.  Stairs            Wheelchair Mobility    Modified Rankin (Stroke Patients Only)       Balance Overall balance assessment: Needs assistance   Sitting balance-Leahy Scale: Normal       Standing balance-Leahy Scale: Good Standing balance comment: Utilized cane to ambulate but did transfer and take a few steps in room without support                             Pertinent Vitals/Pain Pain Assessment: No/denies pain    Home Living Family/patient expects to be discharged to::  Private residence Living Arrangements: Alone Available Help at Discharge: Other (Comment) (reports no family nearby; has had home health aides in past but not recently (has long term care insurance)) Type of Home: House Home Access: Ramped entrance     Home Layout: Two level;1/2 bath on main level;Bed/bath upstairs Home Equipment: Cane - single point;Walker - 2 wheels;Bedside commode;Wheelchair - power;Wheelchair - Biomedical engineer Comments: Most of equipment was for husband but he has passed    Prior Function Level of Independence: Needs assistance   Gait / Transfers Assistance Needed: Ambulates with cane  outside of home; no AD in home; can ambulate in community  ADL's / [place] Needed: Pt independent with ADLs and IADLs; does shopping; drives        Hand Dominance        Extremity/Trunk Assessment   Upper Extremity Assessment Upper Extremity Assessment: Overall WFL for tasks assessed    Lower Extremity Assessment Lower Extremity Assessment: Overall WFL for tasks assessed    Cervical / Trunk Assessment Cervical / Trunk Assessment: Normal  Communication   Communication: No difficulties  Cognition Arousal/Alertness: Awake/alert Behavior During Therapy: WFL for tasks assessed/performed Overall Cognitive Status: Within Functional Limits for tasks assessed                                        General Comments General comments (skin integrity, edema, etc.): Educated on PT role and POC.  Encouraged hallway ambulation 3 x day with nursing , plus could continue ambulating to bathroom. PT will work to progress    Exercises     Assessment/Plan    PT Assessment Patient needs continued PT services  PT Problem List Decreased mobility;Decreased activity tolerance;Cardiopulmonary status limiting activity;Decreased balance;Decreased knowledge of use of DME;Decreased strength       PT Treatment Interventions DME instruction;Therapeutic activities;Gait training;Therapeutic exercise;Patient/family education;Stair training;Balance training;Functional mobility training    PT Goals (Current goals can be found in the Care Plan section)  Acute Rehab PT Goals Patient Stated Goal: return home; build endurnace PT Goal Formulation: With patient Time For Goal Achievement: 12/10/20 Potential to Achieve Goals: Good    Frequency Min 3X/week   Barriers to discharge Decreased caregiver support;Inaccessible home environment Flight of steps to bedroom    Co-evaluation               AM-PAC PT "6 Clicks" Mobility  Outcome Measure Help needed turning from  your back to your side while in a flat bed without using bedrails?: None Help needed moving from lying on your back to sitting on the side of a flat bed without using bedrails?: None Help needed moving to and from a bed to a chair (including a wheelchair)?: None Help needed standing up from a chair using your arms (e.g., wheelchair or bedside chair)?: A Little Help needed to walk in hospital room?: A Little Help needed climbing 3-5 steps with a railing? : A Little 6 Click Score: 21    End of Session Equipment Utilized During Treatment: Gait belt Activity Tolerance: Patient limited by fatigue Patient left: in bed;with call bell/phone within reach Nurse Communication: Mobility status PT Visit Diagnosis: Other abnormalities of gait and mobility (R26.89)    Time: 5597-4163 PT Time Calculation (min) (ACUTE ONLY): 26 min   Charges:   PT Evaluation $PT Eval Moderate Complexity: 1 Mod PT Treatments $Gait Training: 8-22 mins  Abran Richard, PT Acute Rehab Services Pager [phone] [facility] [phone]   Karlton Lemon 11/26/2020, 5:20 PM

## 2020-11-26 NOTE — Progress Notes (Signed)
PROGRESS NOTE    Genieve Ramaswamy   WPY:[id number]  DOB: Mar 27, 1946  PCP: Holland Commons, FNP    DOA: 11/24/2020 LOS: 2   Assessment & Plan   Principal Problem:   Acute on chronic combined systolic and diastolic congestive heart failure, NYHA class 3 (HCC) Active Problems:   Supratherapeutic INR   Essential hypertension   Dyspnea on exertion   Deep vein thrombosis (DVT) of both lower extremities (HCC)   History of pulmonary embolism   Prediabetes   Lower extremity edema   Insomnia   OSA on CPAP   Class 3 severe obesity with serious comorbidity and body mass index (BMI) of 40.0 to 44.9 in adult [facility])   Hyperthyroidism  Hospital Problem List as of 11/26/2020          Priority Resolved POA     High     * (Principal) Acute on chronic combined systolic and diastolic congestive heart failure, NYHA class 3 ([place]) High  Yes    Overview Signed 08/19/2015 10:24 AM by [name], MD     Systolic and diastolic heart failure        Current Assessment & Plan 11/24/2020 Hospital Encounter Written 11/26/2020  3:38 PM by Ezekiel Slocumb, DO     presented with elevated BNP, with clinical symptoms and findings on xray c/w CHF. Likely due to lack of medicine compliance, had not been taking Lasix recently. Prior Echo Jan 2021 with preserved EF, grade 1 DD. Excellent diuresis with IV Lasix started on admission. ECHO 11/25/20 -- EF 25-30%, global LV hypokinesis, grade 2 diastolic dysfunction --Cardiology consulted, see their recs --Strict I/O's & daily weights --IV Lasix - diuretic adjustments per cardiology --Toprol-XL - cardiology optimizing dose --Lisinopril changed to losartan / consider Entresto in outpatient setting if financially feasible --O2 PRN, keeps sat >90% --Telemetry         Supratherapeutic INR High  Yes    Current Assessment & Plan 11/24/2020 Hospital Encounter Written 11/26/2020  3:27 PM by Ezekiel Slocumb, DO     Presented with INR of 5.5.  On warfarin  chronically for hx of PE's and bilateral DVT's. --Hold warfarin --Daily INR's --Pharmacy consulted         Medium     Essential hypertension Medium  Yes    Overview Signed 04/17/2013 12:44 PM by [name], MD     hypertension        Current Assessment & Plan 11/24/2020 Hospital Encounter Written 11/26/2020  3:26 PM by Ezekiel Slocumb, DO     Was taking lisinopril and Toprol PTA. Toprol continued & cardiology optimizing dose. Lisinopril changed to losartan per cardiology.         Dyspnea on exertion Medium  Yes    Overview Signed 11/04/2014  2:17 PM by [name], MD     Dyspnea on exertion        Current Assessment & Plan 11/24/2020 Hospital Encounter Written 11/26/2020  3:25 PM by Nicole Kindred A, DO     Chronic and multifactorial due to CHF, deconditioning, obesity, OAS.  Acutely worse in setting of volume overload w/ decomensated CHF.          Deep vein thrombosis (DVT) of both lower extremities ([place]) Medium  Yes    Overview Signed 04/17/2013 12:42 PM by [name], MD     Bilateral DVT        Current Assessment & Plan 11/24/2020 Hospital Encounter Written 11/26/2020  3:35  PM by Ezekiel Slocumb, DO     On warfarin - currently held due to supratherapeutic INR         History of pulmonary embolism Medium  Yes    Current Assessment & Plan 11/24/2020 Hospital Encounter Written 11/26/2020  3:36 PM by Ezekiel Slocumb, DO     On warfarin - currently held due to supratherapeutic INR         Prediabetes Medium  Yes    Current Assessment & Plan 11/24/2020 Hospital Encounter Written 11/26/2020  3:39 PM by Nicole Kindred A, DO     A1c 5.8%.  Continue efforts at weight loss and healthy diet.  Will monitor fasting glucose for now.         Lower extremity edema Medium  Yes    Current Assessment & Plan 11/24/2020 Hospital Encounter Written 11/26/2020  3:28 PM by Nicole Kindred A, DO     Chronic, due to hx of bilateral DVT's.  Acutely worse with  decompensated CHF. Diuresing per cardiology.         Low     Insomnia Low  Yes    Current Assessment & Plan 11/24/2020 Hospital Encounter Written 11/26/2020  3:36 PM by Ezekiel Slocumb, DO     Continue sleep aid          OSA on CPAP Low  Not Applicable    Current Assessment & Plan 11/24/2020 Hospital Encounter Written 11/26/2020  3:35 PM by Nicole Kindred A, DO     CPAP ordered         Class 3 severe obesity with serious comorbidity and body mass index (BMI) of 40.0 to 44.9 in adult [facility]) Low  Not Applicable    Current Assessment & Plan 11/24/2020 Hospital Encounter Written 11/26/2020  3:34 PM by Ezekiel Slocumb, DO     Complicates overall care and prognosis.  Recommend lifestyle modifications including physical activity and diet for weight loss and overall long-term health. --Patient attends a wt loss program in community, encouraged to continue this         Unprioritized     Hyperthyroidism   Yes    Current Assessment & Plan 11/24/2020 Hospital Encounter Written 11/26/2020  3:23 PM by Nicole Kindred A, DO     TSH within normal this admission. --Continue methimazole.         RESOLVED: Anxiety and depression  11/24/2020 Yes    Resolved by  Orma Flaming, MD     RESOLVED: Acute exacerbation of CHF (congestive heart failure) ([place])  2/99/2426 Not Applicable    Resolved by  Orma Flaming, MD     RESOLVED: Other specified hypothyroidism  11/26/2020 Yes    Resolved by  Ezekiel Slocumb, DO      DVT prophylaxis:  warfarin (COUMADIN) tablet 3 mg   Diet:  Diet Orders (From admission, onward)     Start     Ordered   11/24/20 1954  Diet Heart Room service appropriate? Yes; Fluid consistency: Thin  Diet effective now       Question Answer Comment  Room service appropriate? Yes   Fluid consistency: Thin      11/24/20 1954              Code Status: Full Code   Brief Narrative / Hospital Course to Date:   75 y.o. female with medical history significant of  HTN, hx of PE and DVTs, systolic and diastolic CHF, OSA, hypothyroidism, prediabetes, anxiety and depression who presented  for SOB, wt gain, increased lower extremity swelling and cough.  Sent to ER from her Healthy weight and wellness appointment when noted to be severely short of breath.   Admitted for acute on chronic combined CHF and A-fib with RVR.  Cardiology consulted.  Subjective 11/26/20    Pt seen today, along with cardiologist.  She reports breathing is about the same today.  Just still really does not feel like herself for past several weeks.  Making good urine output with Lasix.     Disposition Plan & Communication   Status is: Inpatient  Remains inpatient appropriate because:IV treatments appropriate due to intensity of illness or inability to take PO  Dispo: The patient is from: Home              Anticipated d/c is to: Home              Patient currently is not medically stable to d/c.   Difficult to place patient No     Consults, Procedures, Significant Events   Consultants:  Cardiology  Procedures:  Echo 11/25/20 - EF 09-62%, grade 2 diastolic dysfunction  Antimicrobials:  Anti-infectives (From admission, onward)    None         Micro    Objective   Vitals:   11/26/20 0003 11/26/20 0356 11/26/20 0814 11/26/20 1132  BP: 113/65 106/73 112/82 104/81  Pulse: 91 86 (!) 42 80  Resp: 20 18  16   Temp: 97.7 F (36.5 C) 97.6 F (36.4 C) 98.5 F (36.9 C) (!) 97.5 F (36.4 C)  TempSrc: Oral Oral Oral Oral  SpO2: 97% 95% 96% 94%  Weight:  100.9 kg    Height:        Intake/Output Summary (Last 24 hours) at 11/26/2020 1546 Last data filed at 11/26/2020 1200 Gross per 24 hour  Intake 840 ml  Output 1800 ml  Net -960 ml   Filed Weights   11/24/20 1625 11/25/20 0332 11/26/20 0356  Weight: 102.5 kg 104.1 kg 100.9 kg    Physical Exam:  General exam: awake, alert, no acute distress, obese, pleasant and conversational Respiratory system: CTAB with  decreased bases, no wheezes, rales or rhonchi, normal respiratory effort. Cardiovascular system: normal S1/S2, RRR, severe BLE edema. Central nervous system: A&O x4. no gross focal neurologic deficits, normal speech Skin: dry, intact, normal temperature Psychiatry: normal mood, congruent affect, judgement and insight appear normal  Labs   Data Reviewed: I have personally reviewed following labs and imaging studies  CBC: Recent Labs  Lab 11/24/20 1631  WBC 9.2  NEUTROABS 5.9  HGB 15.3*  HCT 49.0*  MCV 92.5  PLT 836   Basic Metabolic Panel: Recent Labs  Lab 11/24/20 1631 11/24/20 1956 11/25/20 0457 11/26/20 0408  NA 140  --  140 141  K 4.4  --  3.6 3.6  CL 109  --  107 104  CO2 24  --  25 29  GLUCOSE 113*  --  106* 118*  BUN 16  --  15 22  CREATININE 0.95  --  0.95 1.12*  CALCIUM 9.4  --  8.8* 8.4*  MG  --  1.9  --  1.7   GFR: Estimated Creatinine Clearance: 48.7 mL/min (A) (by C-G formula based on SCr of 1.12 mg/dL (H)). Liver Function Tests: No results for input(s): AST, ALT, ALKPHOS, BILITOT, PROT, ALBUMIN in the last 168 hours. No results for input(s): LIPASE, AMYLASE in the last 168 hours. No results for input(s): AMMONIA in  the last 168 hours. Coagulation Profile: Recent Labs  Lab 11/24/20 1631 11/25/20 0457 11/26/20 0408  INR 5.5* 4.7* 3.4*   Cardiac Enzymes: No results for input(s): CKTOTAL, CKMB, CKMBINDEX, TROPONINI in the last 168 hours. BNP (last 3 results) No results for input(s): PROBNP in the last 8760 hours. HbA1C: No results for input(s): HGBA1C in the last 72 hours. CBG: No results for input(s): GLUCAP in the last 168 hours. Lipid Profile: No results for input(s): CHOL, HDL, LDLCALC, TRIG, CHOLHDL, LDLDIRECT in the last 72 hours. Thyroid Function Tests: Recent Labs    11/25/20 0457  TSH 2.416   Anemia Panel: No results for input(s): VITAMINB12, FOLATE, FERRITIN, TIBC, IRON, RETICCTPCT in the last 72 hours. Sepsis Labs: No results  for input(s): PROCALCITON, LATICACIDVEN in the last 168 hours.  Recent Results (from the past 240 hour(s))  Resp Panel by RT-PCR (Flu A&B, Covid) Nasopharyngeal Swab     Status: None   Collection Time: 11/24/20  6:09 PM   Specimen: Nasopharyngeal Swab; Nasopharyngeal(NP) swabs in vial transport medium  Result Value Ref Range Status   SARS Coronavirus 2 by RT PCR NEGATIVE NEGATIVE Final    Comment: (NOTE) SARS-CoV-2 target nucleic acids are NOT DETECTED.  The SARS-CoV-2 RNA is generally detectable in upper respiratory specimens during the acute phase of infection. The lowest concentration of SARS-CoV-2 viral copies this assay can detect is 138 copies/mL. A negative result does not preclude SARS-Cov-2 infection and should not be used as the sole basis for treatment or other patient management decisions. A negative result may occur with  improper specimen collection/handling, submission of specimen other than nasopharyngeal swab, presence of viral mutation(s) within the areas targeted by this assay, and inadequate number of viral copies(<138 copies/mL). A negative result must be combined with clinical observations, patient history, and epidemiological information. The expected result is Negative.  Fact Sheet for Patients:  EntrepreneurPulse.com.au  Fact Sheet for Healthcare Providers:  IncredibleEmployment.be  This test is no t yet approved or cleared by the [place] FDA and  has been authorized for detection and/or diagnosis of SARS-CoV-2 by FDA under an Emergency Use Authorization (EUA). This EUA will remain  in effect (meaning this test can be used) for the duration of the COVID-19 declaration under Section 564(b)(1) of the Act, 21 U.S.C.section 360bbb-3(b)(1), unless the authorization is terminated  or revoked sooner.       Influenza A by PCR NEGATIVE NEGATIVE Final   Influenza B by PCR NEGATIVE NEGATIVE Final    Comment: (NOTE) The  Xpert Xpress SARS-CoV-2/FLU/RSV plus assay is intended as an aid in the diagnosis of influenza from Nasopharyngeal swab specimens and should not be used as a sole basis for treatment. Nasal washings and aspirates are unacceptable for Xpert Xpress SARS-CoV-2/FLU/RSV testing.  Fact Sheet for Patients: EntrepreneurPulse.com.au  Fact Sheet for Healthcare Providers: IncredibleEmployment.be  This test is not yet approved or cleared by the [place] FDA and has been authorized for detection and/or diagnosis of SARS-CoV-2 by FDA under an Emergency Use Authorization (EUA). This EUA will remain in effect (meaning this test can be used) for the duration of the COVID-19 declaration under Section 564(b)(1) of the Act, 21 U.S.C. section 360bbb-3(b)(1), unless the authorization is terminated or revoked.  Performed at [facility], [place] [address] [place],  [address]       Imaging Studies   DG Chest Port 1 View  Result Date: 11/24/2020 CLINICAL DATA:  Short of breath and weakness EXAM: PORTABLE CHEST  1 VIEW COMPARISON:  07/05/2015 FINDINGS: Stable enlarged cardiac silhouette. Central venous pulmonary congestion. Mild basilar atelectasis. No overt pulmonary edema. No pneumothorax. No focal consolidation. No acute osseous abnormality. IMPRESSION: Cardiomegaly and central venous congestion suggest volume overload. Electronically Signed   By: Suzy Bouchard M.D.   On: 11/24/2020 17:26   ECHOCARDIOGRAM COMPLETE  Result Date: 11/25/2020    ECHOCARDIOGRAM REPORT   Patient Name:   KORBYN VANES Date of Exam: 11/25/2020 Medical Rec #:  [id number]   Height:       61.0 in Accession #:    [id number]  Weight:       229.6 lb Date of Birth:  03-11-1946    BSA:          2.003 m Patient Age:    75 years    BP:           143/95 mmHg Patient Gender: F           HR:           101 bpm. Exam Location:  Inpatient Procedure: 2D Echo, Cardiac Doppler, Color Doppler and  Intracardiac            Opacification Agent Indications:    I50.23 Acute on chronic systolic (congestive) heart failure  History:        Patient has prior history of Echocardiogram examinations, most                 recent 06/10/2019. Risk Factors:Hypertension and Sleep Apnea.                 Pulmonary Embolism. Hypothyroidism.  Sonographer:    Jonelle Sidle Dance Referring Phys: [id number] [place]  1. Left ventricular ejection fraction, by estimation, is 25 to 30%. The left ventricle has severely decreased function. The left ventricle demonstrates global hypokinesis. There is mild left ventricular hypertrophy. Left ventricular diastolic parameters  are consistent with Grade II diastolic dysfunction (pseudonormalization).  2. Right ventricular systolic function is mildly reduced. The right ventricular size is normal. There is mildly elevated pulmonary artery systolic pressure. The estimated right ventricular systolic pressure is 85.4 mmHg.  3. Left atrial size was mild to moderately dilated.  4. The mitral valve is normal in structure. Mild mitral valve regurgitation. No evidence of mitral stenosis.  5. The aortic valve is tricuspid. Aortic valve regurgitation is mild. Mild aortic valve sclerosis is present, with no evidence of aortic valve stenosis.  6. The inferior vena cava is dilated in size with >50% respiratory variability, suggesting right atrial pressure of 8 mmHg. FINDINGS  Left Ventricle: Left ventricular ejection fraction, by estimation, is 25 to 30%. The left ventricle has severely decreased function. The left ventricle demonstrates global hypokinesis. Definity contrast agent was given IV to delineate the left ventricular endocardial borders. The left ventricular internal cavity size was normal in size. There is mild left ventricular hypertrophy. Left ventricular diastolic parameters are consistent with Grade II diastolic dysfunction (pseudonormalization). Right Ventricle: The right ventricular  size is normal. No increase in right ventricular wall thickness. Right ventricular systolic function is mildly reduced. There is mildly elevated pulmonary artery systolic pressure. The tricuspid regurgitant velocity  is 2.80 m/s, and with an assumed right atrial pressure of 8 mmHg, the estimated right ventricular systolic pressure is 62.7 mmHg. Left Atrium: Left atrial size was mild to moderately dilated. Right Atrium: Right atrial size was normal in size. Pericardium: There is no evidence of pericardial effusion. Mitral Valve: The mitral valve is normal  in structure. There is mild thickening of the mitral valve leaflet(s). Mild mitral annular calcification. Mild mitral valve regurgitation. No evidence of mitral valve stenosis. Tricuspid Valve: The tricuspid valve is normal in structure. Tricuspid valve regurgitation is mild. Aortic Valve: The aortic valve is tricuspid. Aortic valve regurgitation is mild. Aortic regurgitation PHT measures 419 msec. Mild aortic valve sclerosis is present, with no evidence of aortic valve stenosis. Pulmonic Valve: The pulmonic valve was normal in structure. Pulmonic valve regurgitation is not visualized. Aorta: The aortic root is normal in size and structure. Venous: The inferior vena cava is dilated in size with greater than 50% respiratory variability, suggesting right atrial pressure of 8 mmHg. IAS/Shunts: No atrial level shunt detected by color flow Doppler.  LEFT VENTRICLE PLAX 2D LVIDd:         5.40 cm LVIDs:         4.90 cm LV PW:         1.00 cm LV IVS:        1.10 cm LVOT diam:     2.20 cm LV SV:         79 LV SV Index:   39 LVOT Area:     3.80 cm  RIGHT VENTRICLE          IVC RV Basal diam:  2.80 cm  IVC diam: 2.40 cm TAPSE (M-mode): 1.8 cm LEFT ATRIUM             Index       RIGHT ATRIUM           Index LA diam:        4.50 cm 2.25 cm/m  RA Area:     12.00 cm LA Vol (A2C):   92.2 ml 46.03 ml/m RA Volume:   23.40 ml  11.68 ml/m LA Vol (A4C):   65.3 ml 32.60 ml/m LA  Biplane Vol: 78.7 ml 39.29 ml/m  AORTIC VALVE LVOT Vmax:   114.95 cm/s LVOT Vmean:  76.100 cm/s LVOT VTI:    0.207 m AI PHT:      419 msec  AORTA Ao Root diam: 2.90 cm Ao Asc diam:  3.30 cm MITRAL VALVE                TRICUSPID VALVE MV Area (PHT): 3.03 cm     TR Peak grad:   31.4 mmHg MV Decel Time: 250 msec     TR Vmax:        280.00 cm/s MV E velocity: 147.00 cm/s MV A velocity: 104.00 cm/s  SHUNTS MV E/A ratio:  1.41         Systemic VTI:  0.21 m                             Systemic Diam: 2.20 cm Loralie Champagne MD Electronically signed by Loralie Champagne MD Signature Date/Time: 11/25/2020/4:28:42 PM    Final      Medications   Scheduled Meds:  furosemide  40 mg Intravenous BID   [START ON 11/27/2020] losartan  25 mg Oral Daily   methimazole  5 mg Oral Daily   metoprolol succinate  50 mg Oral Daily   progesterone  200 mg Oral QHS   sodium chloride flush  3 mL Intravenous Q12H   warfarin  3 mg Oral ONCE-1600   Warfarin - Pharmacist Dosing Inpatient   Does not apply q1600   zolpidem  5 mg Oral QHS  Continuous Infusions:  sodium chloride         LOS: 2 days    Time spent: 30 minuets with > 50% spent at bedside and in coordination of care.    Ezekiel Slocumb, DO [facility]  11/26/2020, 3:46 PM      If 7PM-7AM, please contact night-coverage. How to contact the [facility] Attending or Consulting provider [place] or covering provider during after hours [place], for this patient?    Check the care team in [facility] and look for a) attending/consulting [facility] provider listed and b) the [facility] team listed Log into [email or web address] and use [place]'s universal password to access. If you do not have the password, please contact the hospital operator. Locate the [facility] provider you are looking for under [facility] and page to a number that you can be directly reached. If you still have difficulty reaching the provider, please page the [facility] (Director on Call) for the Hospitalists listed on  amion for assistance.

## 2020-11-26 NOTE — Assessment & Plan Note (Addendum)
Continue warfarin, dose per pharmacy.

## 2020-11-26 NOTE — Assessment & Plan Note (Signed)
Chronic and multifactorial due to CHF, deconditioning, obesity, OAS.  Acutely worse in setting of volume overload w/ decomensated CHF.

## 2020-11-26 NOTE — Progress Notes (Signed)
Progress Note  Patient Name: Nicole Gibbs Date of Encounter: 11/26/2020  Primary Cardiologist: Quay Burow, MD   Subjective   Patient doing well over night.  Has improved but still doesn't feel back to baseline. Found to have further reduction in EF.  Inpatient Medications    Scheduled Meds:  furosemide  40 mg Intravenous BID   lisinopril  5 mg Oral Daily   methimazole  5 mg Oral Daily   metoprolol succinate  50 mg Oral Daily   progesterone  200 mg Oral QHS   sodium chloride flush  3 mL Intravenous Q12H   warfarin  3 mg Oral ONCE-1600   Warfarin - Pharmacist Dosing Inpatient   Does not apply q1600   zolpidem  5 mg Oral QHS   Continuous Infusions:  sodium chloride     PRN Meds: sodium chloride, acetaminophen **OR** acetaminophen, nitroGLYCERIN, sodium chloride flush   Vital Signs    Vitals:   11/25/20 2033 11/26/20 0003 11/26/20 0356 11/26/20 0814  BP: 107/81 113/65 106/73 112/82  Pulse: (!) 104 91 86 (!) 42  Resp: 18 20 18    Temp: (!) 97.4 F (36.3 C) 97.7 F (36.5 C) 97.6 F (36.4 C) 98.5 F (36.9 C)  TempSrc: Oral Oral Oral Oral  SpO2: 97% 97% 95% 96%  Weight:   100.9 kg   Height:        Intake/Output Summary (Last 24 hours) at 11/26/2020 1101 Last data filed at 11/26/2020 1028 Gross per 24 hour  Intake 840 ml  Output 2600 ml  Net -1760 ml   Filed Weights   11/24/20 1625 11/25/20 0332 11/26/20 0356  Weight: 102.5 kg 104.1 kg 100.9 kg    Telemetry    Sinus rhythm with significant SVT; there are p wave morphologies and AT is suspected - Personally Reviewed  ECG    No new since 11/25/20- Personally Reviewed  Physical Exam   GEN: No acute distress.   Neck: No JVD Cardiac: regular with irregular salvos, no murmurs, rubs, or gallops.  Respiratory: Slight crackles in bases good air movement GI: Soft, nontender, non-distended  MS: non-pitting edema; No deformity. Neuro:  Nonfocal  Psych: Normal affect   Labs    Chemistry Recent Labs  Lab  11/24/20 1631 11/25/20 0457 11/26/20 0408  NA 140 140 141  K 4.4 3.6 3.6  CL 109 107 104  CO2 24 25 29   GLUCOSE 113* 106* 118*  BUN 16 15 22   CREATININE 0.95 0.95 1.12*  CALCIUM 9.4 8.8* 8.4*  GFRNONAA >60 >60 52*  ANIONGAP 7 8 8      Hematology Recent Labs  Lab 11/24/20 1631  WBC 9.2  RBC 5.30*  HGB 15.3*  HCT 49.0*  MCV 92.5  MCH 28.9  MCHC 31.2  RDW 14.4  PLT 284    Cardiac EnzymesNo results for input(s): TROPONINI in the last 168 hours. No results for input(s): TROPIPOC in the last 168 hours.   BNP Recent Labs  Lab 11/24/20 1631  BNP 1,930.9*     DDimer No results for input(s): DDIMER in the last 168 hours.   Radiology    DG Chest Port 1 View  Result Date: 11/24/2020 CLINICAL DATA:  Short of breath and weakness EXAM: PORTABLE CHEST 1 VIEW COMPARISON:  07/05/2015 FINDINGS: Stable enlarged cardiac silhouette. Central venous pulmonary congestion. Mild basilar atelectasis. No overt pulmonary edema. No pneumothorax. No focal consolidation. No acute osseous abnormality. IMPRESSION: Cardiomegaly and central venous congestion suggest volume overload. Electronically Signed  By: Suzy Bouchard M.D.   On: 11/24/2020 17:26   ECHOCARDIOGRAM COMPLETE  Result Date: 11/25/2020    ECHOCARDIOGRAM REPORT   Patient Name:   [name] Date of Exam: 11/25/2020 Medical Rec #:  [id number]   Height:       61.0 in Accession #:    [id number]  Weight:       229.6 lb Date of Birth:  03/27/1946    BSA:          2.003 m Patient Age:    75 years    BP:           143/95 mmHg Patient Gender: F           HR:           101 bpm. Exam Location:  Inpatient Procedure: 2D Echo, Cardiac Doppler, Color Doppler and Intracardiac            Opacification Agent Indications:    I50.23 Acute on chronic systolic (congestive) heart failure  History:        Patient has prior history of Echocardiogram examinations, most                 recent 06/10/2019. Risk Factors:Hypertension and Sleep Apnea.                  Pulmonary Embolism. Hypothyroidism.  Sonographer:    Jonelle Sidle Dance Referring Phys: [id number] [place]  1. Left ventricular ejection fraction, by estimation, is 25 to 30%. The left ventricle has severely decreased function. The left ventricle demonstrates global hypokinesis. There is mild left ventricular hypertrophy. Left ventricular diastolic parameters  are consistent with Grade II diastolic dysfunction (pseudonormalization).  2. Right ventricular systolic function is mildly reduced. The right ventricular size is normal. There is mildly elevated pulmonary artery systolic pressure. The estimated right ventricular systolic pressure is 05.3 mmHg.  3. Left atrial size was mild to moderately dilated.  4. The mitral valve is normal in structure. Mild mitral valve regurgitation. No evidence of mitral stenosis.  5. The aortic valve is tricuspid. Aortic valve regurgitation is mild. Mild aortic valve sclerosis is present, with no evidence of aortic valve stenosis.  6. The inferior vena cava is dilated in size with >50% respiratory variability, suggesting right atrial pressure of 8 mmHg. FINDINGS  Left Ventricle: Left ventricular ejection fraction, by estimation, is 25 to 30%. The left ventricle has severely decreased function. The left ventricle demonstrates global hypokinesis. Definity contrast agent was given IV to delineate the left ventricular endocardial borders. The left ventricular internal cavity size was normal in size. There is mild left ventricular hypertrophy. Left ventricular diastolic parameters are consistent with Grade II diastolic dysfunction (pseudonormalization). Right Ventricle: The right ventricular size is normal. No increase in right ventricular wall thickness. Right ventricular systolic function is mildly reduced. There is mildly elevated pulmonary artery systolic pressure. The tricuspid regurgitant velocity  is 2.80 m/s, and with an assumed right atrial pressure of 8 mmHg, the  estimated right ventricular systolic pressure is 97.6 mmHg. Left Atrium: Left atrial size was mild to moderately dilated. Right Atrium: Right atrial size was normal in size. Pericardium: There is no evidence of pericardial effusion. Mitral Valve: The mitral valve is normal in structure. There is mild thickening of the mitral valve leaflet(s). Mild mitral annular calcification. Mild mitral valve regurgitation. No evidence of mitral valve stenosis. Tricuspid Valve: The tricuspid valve is normal in structure. Tricuspid valve regurgitation is mild. Aortic Valve: The aortic  valve is tricuspid. Aortic valve regurgitation is mild. Aortic regurgitation PHT measures 419 msec. Mild aortic valve sclerosis is present, with no evidence of aortic valve stenosis. Pulmonic Valve: The pulmonic valve was normal in structure. Pulmonic valve regurgitation is not visualized. Aorta: The aortic root is normal in size and structure. Venous: The inferior vena cava is dilated in size with greater than 50% respiratory variability, suggesting right atrial pressure of 8 mmHg. IAS/Shunts: No atrial level shunt detected by color flow Doppler.  LEFT VENTRICLE PLAX 2D LVIDd:         5.40 cm LVIDs:         4.90 cm LV PW:         1.00 cm LV IVS:        1.10 cm LVOT diam:     2.20 cm LV SV:         79 LV SV Index:   39 LVOT Area:     3.80 cm  RIGHT VENTRICLE          IVC RV Basal diam:  2.80 cm  IVC diam: 2.40 cm TAPSE (M-mode): 1.8 cm LEFT ATRIUM             Index       RIGHT ATRIUM           Index LA diam:        4.50 cm 2.25 cm/m  RA Area:     12.00 cm LA Vol (A2C):   92.2 ml 46.03 ml/m RA Volume:   23.40 ml  11.68 ml/m LA Vol (A4C):   65.3 ml 32.60 ml/m LA Biplane Vol: 78.7 ml 39.29 ml/m  AORTIC VALVE LVOT Vmax:   114.95 cm/s LVOT Vmean:  76.100 cm/s LVOT VTI:    0.207 m AI PHT:      419 msec  AORTA Ao Root diam: 2.90 cm Ao Asc diam:  3.30 cm MITRAL VALVE                TRICUSPID VALVE MV Area (PHT): 3.03 cm     TR Peak grad:   31.4 mmHg  MV Decel Time: 250 msec     TR Vmax:        280.00 cm/s MV E velocity: 147.00 cm/s MV A velocity: 104.00 cm/s  SHUNTS MV E/A ratio:  1.41         Systemic VTI:  0.21 m                             Systemic Diam: 2.20 cm Loralie Champagne MD Electronically signed by Loralie Champagne MD Signature Date/Time: 11/25/2020/4:28:42 PM    Final      Patient Profile     75 y.o. female return of worsening HF  Assessment & Plan     Heart Failure Reduced Ejection Fraction SVT Hx of DVT and PE Morbid Obesity  Hyperthyroidism on methimazole and normal TSH - NYHA class III, Stage C, hypervolemic, etiology unclear - Diuretic regimen: Lasix 40 IV given slight increase in kidney function; may need higher dose - Discussed the importance of fluid restriction of < 2 L, salt restriction, and checking daily weights  - Metoprolol succinate 50 mg daily; we will continue to try and increase this dose as tolerated - Transition to losartan 25 mg PO Daily 11/27/20 for possible future Entresto start (ordered) - aldactone if low K can be considered (12.5 mg PO Daily to start) - continue  warfarin Time Spent Directly with Patient:   I have spent a total of 65 minuteswith the patient reviewing notes, imaging, EKGs, labs and examining the patient as well as establishing an assessment and plan that was discussed personally with the patient.  > 50% of time was spent in direct patient care and primary MD.      For questions or updates, please contact [facility] Please consult [email or web address] for contact info under Cardiology/STEMI.      Signed, Werner Lean, MD  11/26/2020, 11:01 AM

## 2020-11-27 DIAGNOSIS — I5043 Acute on chronic combined systolic (congestive) and diastolic (congestive) heart failure: Secondary | ICD-10-CM | POA: Diagnosis not present

## 2020-11-27 LAB — BASIC METABOLIC PANEL
Anion gap: 8 (ref 5–15)
BUN: 24 mg/dL — ABNORMAL HIGH (ref 8–23)
CO2: 30 mmol/L (ref 22–32)
Calcium: 8.5 mg/dL — ABNORMAL LOW (ref 8.9–10.3)
Chloride: 101 mmol/L (ref 98–111)
Creatinine, Ser: 1.07 mg/dL — ABNORMAL HIGH (ref 0.44–1.00)
GFR, Estimated: 55 mL/min — ABNORMAL LOW (ref >60)
Glucose, Bld: 102 mg/dL — ABNORMAL HIGH (ref 70–99)
Potassium: 3.4 mmol/L — ABNORMAL LOW (ref 3.5–5.1)
Sodium: 139 mmol/L (ref 135–145)

## 2020-11-27 LAB — PROTIME-INR
INR: 2.4 — ABNORMAL HIGH (ref 0.8–1.2)
Prothrombin Time: 26.4 seconds — ABNORMAL HIGH (ref 11.4–15.2)

## 2020-11-27 LAB — MAGNESIUM: Magnesium: 2.3 mg/dL (ref 1.7–2.4)

## 2020-11-27 MED ORDER — SPIRONOLACTONE 25 MG PO TABS
25.0000 mg | ORAL_TABLET | Freq: Every day | ORAL | Status: DC
  Administered 2020-11-27 – 2020-11-30 (×4): 25 mg via ORAL
  Filled 2020-11-27 (×4): qty 1

## 2020-11-27 MED ORDER — POTASSIUM CHLORIDE CRYS ER 20 MEQ PO TBCR
40.0000 meq | EXTENDED_RELEASE_TABLET | ORAL | Status: AC
  Administered 2020-11-27 (×2): 40 meq via ORAL
  Filled 2020-11-27 (×2): qty 2

## 2020-11-27 MED ORDER — EMPAGLIFLOZIN 10 MG PO TABS
10.0000 mg | ORAL_TABLET | Freq: Every day | ORAL | Status: DC
  Administered 2020-11-27 – 2020-11-30 (×4): 10 mg via ORAL
  Filled 2020-11-27 (×4): qty 1

## 2020-11-27 MED ORDER — WARFARIN SODIUM 2.5 MG PO TABS
4.5000 mg | ORAL_TABLET | Freq: Once | ORAL | Status: AC
  Administered 2020-11-27: 4.5 mg via ORAL
  Filled 2020-11-27: qty 1

## 2020-11-27 MED ORDER — POTASSIUM CHLORIDE CRYS ER 20 MEQ PO TBCR: 40.0000 meq | EXTENDED_RELEASE_TABLET | Freq: Once | ORAL | Status: DC

## 2020-11-27 NOTE — Progress Notes (Signed)
Heart Failure Stewardship Pharmacist Progress Note   PCP: Holland Commons, FNP PCP-Cardiologist: Quay Burow, MD    HPI:  75 yo F with PMH of HTN, PE/DVT, CHF, OSA, hypothyroidism, prediabetes, anxiety and depression. She presented to the ED on 11/24/20 with shortness of breath, weight gain, LE edema, and cough. CXR showed cardiomegaly and volume overload. An ECHO was done on 11/25/20 and LVEF was 25-30% (reduced from 55% from ECHO in January 2021).   Current HF Medications: Furosemide 40 mg IV BID Metoprolol XL 50 mg daily Losartan 25 mg daily Spironolactone 25 mg daily Jardiance 10 mg daily  Prior to admission HF Medications: Furosemide 40 mg daily Metoprolol XL 25 mg daily Lisinopril 5 mg daily  Pertinent Lab Values: Serum creatinine 1.07, BUN 24, Potassium 3.4, Sodium 139, BNP 1930.9, Magnesium 2.3  Vital Signs: Weight: 219 lbs (admission weight: 226 lbs) Blood pressure: 110/80s  Heart rate: 80-90s   Medication Assistance / Insurance Benefits Check: Does the patient have prescription insurance?  Yes Type of insurance plan: Medicare + BCBS supplement  Outpatient Pharmacy:  Prior to admission outpatient pharmacy: [facility] Is the patient willing to use [facility] at discharge? Yes Is the patient willing to transition their outpatient pharmacy to [facility]?   Pending    Assessment: 1. Acute on chronic systolic CHF (EF 21-22%), due to NICM. NYHA class III symptoms. - Continue furosemide 40 mg IV BID - Continue metoprolol XL 50 mg daily - Lisinopril transitioned to losartan. Consider optimizing to [facility] following 36h washout period. Last dose of lisinopril given 7/14 at 0843. - Agree with starting spironolactone 25 mg daily - Agree with starting Jardiance 10 mg daily   Plan: 1) Medication changes recommended at this time: - Agree with changes above  2) Patient assistance: - $480 deductible remaining on insurance - After  deductible is met - Wilder Glade is $19.22 per month, Entresto is $32.96 per month, Jardiance is $23.19 per month  3)  Education  - To be completed prior to discharge  Kerby Nora, PharmD, BCPS Heart Failure Cytogeneticist Phone [phone]

## 2020-11-27 NOTE — Progress Notes (Signed)
Physical Therapy Treatment Patient Details Name: Nicole Gibbs MRN: [id number] DOB: Nov 02, 1945 Today's Date: 11/27/2020    History of Present Illness Pt is 75 yo female admitted with combined CHF and Afib with RVR.  She has hx of HTN, PE and DVTs, CHF, OSA, hypothyroidism, prediabetes, morbid obesity, anxiety and depression.    PT Comments    Pt with improved activity tolerance this session, ambulating for increased distances with much more stable HR. Pt does continue to report fatigue and remains mildly unsteady, but declines gait training with 4 wheeled walker. PT encourages use of cane for all out of bed mobility upon return home and continues to recommend HHPT services at the time of discharge.   Follow Up Recommendations  Home health PT;Supervision - Intermittent     Equipment Recommendations  None recommended by PT    Recommendations for Other Services       Precautions / Restrictions Precautions Precautions: Fall Precaution Comments: watch HR (stable this session) Restrictions Weight Bearing Restrictions: No    Mobility  Bed Mobility               General bed mobility comments: not assessed, pt received and left in recliner    Transfers Overall transfer level: Independent Equipment used: None                Ambulation/Gait Ambulation/Gait assistance: Supervision Gait Distance (Feet): 150 Feet Assistive device: Straight cane (PRN use of railing/counter) Gait Pattern/deviations: Step-through pattern Gait velocity: functional Gait velocity interpretation: 1.31 - 2.62 ft/sec, indicative of limited community ambulator General Gait Details: pt with slowed step-through gait, mild increase in lateral sway   Stairs             Wheelchair Mobility    Modified Rankin (Stroke Patients Only)       Balance Overall balance assessment: Needs assistance Sitting-balance support: Feet supported;No upper extremity supported Sitting balance-Leahy Scale:  Normal     Standing balance support: No upper extremity supported Standing balance-Leahy Scale: Fair                              Cognition Arousal/Alertness: Awake/alert Behavior During Therapy: WFL for tasks assessed/performed Overall Cognitive Status: Within Functional Limits for tasks assessed                                        Exercises      General Comments General comments (skin integrity, edema, etc.): VSS on RA, pt does continue to report fatigue with 2 standing rest breaks during ambulation      Pertinent Vitals/Pain Pain Assessment: No/denies pain    Home Living                      Prior Function            PT Goals (current goals can now be found in the care plan section) Acute Rehab PT Goals Patient Stated Goal: to go home and have home health Progress towards PT goals: Progressing toward goals    Frequency    Min 3X/week      PT Plan Current plan remains appropriate    Co-evaluation              AM-PAC PT "6 Clicks" Mobility   Outcome Measure  Help needed turning from your back to  your side while in a flat bed without using bedrails?: None Help needed moving from lying on your back to sitting on the side of a flat bed without using bedrails?: None Help needed moving to and from a bed to a chair (including a wheelchair)?: None Help needed standing up from a chair using your arms (e.g., wheelchair or bedside chair)?: None Help needed to walk in hospital room?: A Little Help needed climbing 3-5 steps with a railing? : A Little 6 Click Score: 22    End of Session   Activity Tolerance: Patient limited by fatigue Patient left: in chair;with call bell/phone within reach Nurse Communication: Mobility status PT Visit Diagnosis: Other abnormalities of gait and mobility (R26.89)     Time: 2446-9507 PT Time Calculation (min) (ACUTE ONLY): 11 min  Charges:  $Gait Training: 8-22 mins                      [name], PT, DPT Acute Rehabilitation Pager: [phone]    [name] 11/27/2020, 12:23 PM

## 2020-11-27 NOTE — Progress Notes (Signed)
ANTICOAGULATION CONSULT NOTE - Follow Up Consult  Pharmacy Consult for Warfarin Indication: history of pulmonary embolus and DVT  Allergies  Allergen Reactions   Keflex [Cephalexin] Diarrhea   Sulfa Antibiotics Hives    Patient Measurements: Height: 5\' 1"  (154.9 cm) Weight: 99.4 kg (219 lb 3.2 oz) (scale c) IBW/kg (Calculated) : 47.8  Vital Signs: Temp: 97.4 F (36.3 C) (07/15 0500) Temp Source: Oral (07/15 0500) BP: 114/76 (07/15 0500) Pulse Rate: 62 (07/15 0500)  Labs: Recent Labs    11/24/20 1631 11/24/20 1956 11/25/20 0457 11/26/20 0408 11/27/20 0412  HGB 15.3*  --   --   --   --   HCT 49.0*  --   --   --   --   PLT 284  --   --   --   --   LABPROT 49.9*  --  44.4* 34.0* 26.4*  INR 5.5*  --  4.7* 3.4* 2.4*  CREATININE 0.95  --  0.95 1.12* 1.07*  TROPONINIHS 49* 50* 65*  --   --     Estimated Creatinine Clearance: 50.6 mL/min (A) (by C-G formula based on SCr of 1.07 mg/dL (H)).  Assessment:  75 yr old female continues on Warfarin as PTA for hx pulmonary embolism and DVT.   INR 5.5 on admit and warfarin held 7/12 and 7/13.  INR down to 4.3 on 7/14 and Warfarin resumed with usual 3 mg dose, since anticipated INR to continue to fall.  INR 2.4 today, therapeutic.   PTA warfarin regimen: 4.5 mg MWF, 3 mg TTSS.   - last outpatient INR 3.5 on 10/14/20 on same regimen; prior INRs had been therapeutic on same regimen and dose was not adjusted  Goal of Therapy:  INR 2-3 Monitor platelets by anticoagulation protocol: Yes   Plan:  Warfarin 4.5 mg today (usual Friday dose). Daily PT/INR for now.   Arty Baumgartner, RPh 11/27/2020,10:27 AM

## 2020-11-27 NOTE — Evaluation (Signed)
Occupational Therapy Evaluation Patient Details Name: Nicole Gibbs MRN: [id number] DOB: [birth year] Today's Date: 11/27/2020    History of Present Illness Pt is 75 yo female admitted with combined CHF and Afib with RVR.  She has hx of HTN, PE and DVTs, CHF, OSA, hypothyroidism, prediabetes, morbid obesity, anxiety and depression.   Clinical Impression    Pt. Has decreased I and safety with ADLs, mobility, and IADLs. Pt. Is motivated to regain lost ability. Pt. States she wants to have home health when she dc. Pt. Is agreeable to acute OT while she is an the hospital.   Follow Up Recommendations  Home health OT    Equipment Recommendations  None recommended by OT    Recommendations for Other Services       Precautions / Restrictions Precautions Precautions: Fall Restrictions Weight Bearing Restrictions: No      Mobility Bed Mobility Overal bed mobility: Independent                  Transfers Overall transfer level: Needs assistance Equipment used: Straight cane Transfers: Sit to/from Stand;Stand Pivot Transfers Sit to Stand: Min guard Stand pivot transfers: Min guard       General transfer comment: Pt. states she feels weaker today.    Balance Overall balance assessment: Needs assistance   Sitting balance-Leahy Scale: Normal       Standing balance-Leahy Scale: Good                             ADL either performed or assessed with clinical judgement   ADL Overall ADL's : Needs assistance/impaired Eating/Feeding: Independent   Grooming: Wash/dry hands;Wash/dry face;Set up;Sitting   Upper Body Bathing: Supervision/ safety;Sitting   Lower Body Bathing: Minimal assistance;Sit to/from stand   Upper Body Dressing : Set up;Sitting   Lower Body Dressing: Moderate assistance;Sit to/from stand (need further ed on ae)   Toilet Transfer: Min guard;BSC   Toileting- Architect and Hygiene: Min guard       Functional mobility during  ADLs: Min guard (amb with a cane) General ADL Comments: Pt. needs further ed on use of ae for increasing I and for energy conservation.     Vision Baseline Vision/History: Wears glasses Wears Glasses: At all times Patient Visual Report: No change from baseline       Perception     Praxis      Pertinent Vitals/Pain Pain Assessment: 0-10 Pain Score: 1  Pain Location: uretha Pain Descriptors / Indicators: Burning Pain Intervention(s): Other (comment) (Pt. nurser aware of burning.)     Hand Dominance Right   Extremity/Trunk Assessment Upper Extremity Assessment Upper Extremity Assessment: Overall WFL for tasks assessed   Lower Extremity Assessment Lower Extremity Assessment: Defer to PT evaluation       Communication Communication Communication: No difficulties   Cognition Arousal/Alertness: Awake/alert Behavior During Therapy: WFL for tasks assessed/performed Overall Cognitive Status: Within Functional Limits for tasks assessed                                     General Comments       Exercises     Shoulder Instructions      Home Living Family/patient expects to be discharged to:: Private residence Living Arrangements: Alone Available Help at Discharge: Other (Comment) Type of Home: House Home Access: Ramped entrance     Home Layout: Two  level;1/2 bath on main level;Bed/bath upstairs Alternate Level Stairs-Number of Steps: Has a chair lift for stairs but it is currently broken; has been able to do stairs; 14 steps Alternate Level Stairs-Rails: Right Bathroom Shower/Tub: Tub/shower unit;Walk-in shower   Bathroom Toilet: Handicapped height     Home Equipment: Cane - single point;Walker - 2 wheels;Bedside commode;Wheelchair - power;Wheelchair - Consulting civil engineer Comments: Most of equipment was for husband but he has passed      Prior Functioning/Environment Level of Independence: Needs assistance  Gait / Transfers  Assistance Needed: Ambulates with cane outside of home; no AD in home; can ambulate in community ADL's / Homemaking Assistance Needed: Pt independent with ADLs and IADLs; does shopping; drives            OT Problem List: Decreased activity tolerance;Decreased knowledge of use of DME or AE;Cardiopulmonary status limiting activity      OT Treatment/Interventions: Self-care/ADL training;Energy conservation;DME and/or AE instruction;Therapeutic activities    OT Goals(Current goals can be found in the care plan section) Acute Rehab OT Goals Patient Stated Goal: to go home and have home health OT Goal Formulation: With patient Time For Goal Achievement: 12/11/20 Potential to Achieve Goals: Good ADL Goals Pt Will Perform Grooming: with modified independence Pt Will Perform Upper Body Bathing: with modified independence Pt Will Perform Lower Body Bathing: with modified independence Pt Will Perform Upper Body Dressing: with modified independence Pt Will Perform Lower Body Dressing: with modified independence Pt Will Transfer to Toilet: with modified independence Pt Will Perform Toileting - Clothing Manipulation and hygiene: with modified independence Pt Will Perform Tub/Shower Transfer: with modified independence  OT Frequency: Min 2X/week   Barriers to D/C: Decreased caregiver support (Pt. states she feels like she will be fine at home.)          Co-evaluation              AM-PAC OT "6 Clicks" Daily Activity     Outcome Measure Help from another person eating meals?: None Help from another person taking care of personal grooming?: A Little Help from another person toileting, which includes using toliet, bedpan, or urinal?: A Little Help from another person bathing (including washing, rinsing, drying)?: A Little Help from another person to put on and taking off regular upper body clothing?: A Little Help from another person to put on and taking off regular lower body clothing?:  A Little 6 Click Score: 19   End of Session Equipment Utilized During Treatment: Gait belt (cane) Nurse Communication:  (nurse ok treatment)  Activity Tolerance: Patient tolerated treatment well Patient left: in chair;with call bell/phone within reach (nursing stated chair alarm not needed.)  OT Visit Diagnosis: Other abnormalities of gait and mobility (R26.89)                Time: 7846-9629 OT Time Calculation (min): 23 min Charges:  OT General Charges $OT Visit: 1 Visit OT Evaluation $OT Eval Moderate Complexity: 1 Mod  Erick Murin OT/L   Nicole Gibbs 11/27/2020, 9:26 AM

## 2020-11-27 NOTE — Progress Notes (Signed)
PROGRESS NOTE    Nicole Gibbs   BPZ:[id number]  DOB: 03-21-1946  PCP: Holland Commons, FNP    DOA: 11/24/2020 LOS: 3   Assessment & Plan   Principal Problem:   Acute on chronic combined systolic and diastolic congestive heart failure, NYHA class 3 (HCC) Active Problems:   Supratherapeutic INR   Essential hypertension   Dyspnea on exertion   Deep vein thrombosis (DVT) of both lower extremities (HCC)   History of pulmonary embolism   Prediabetes   Lower extremity edema   Insomnia   OSA on CPAP   Class 3 severe obesity with serious comorbidity and body mass index (BMI) of 40.0 to 44.9 in adult [facility])   Hyperthyroidism  Hyperthyroidism TSH within normal this admission. --Continue methimazole.  Dyspnea on exertion Chronic and multifactorial due to CHF, deconditioning, obesity, OAS.  Acutely worse in setting of volume overload w/ decomensated CHF.   Essential hypertension Was taking lisinopril and Toprol PTA, had been noncompliant with Lasix. Toprol continued & cardiology optimizing dose. Lisinopril changed to losartan. Started on Aldactone as well.  Supratherapeutic INR Resolved.  Presented with INR of 5.5.   On warfarin chronically for hx of PE's and bilateral DVT's. --Warfarin resumed, dosing per pharmacy --Daily INR's  Lower extremity edema Chronic, due to hx of bilateral DVT's.  Acutely worse with decompensated CHF. Diuresing as outlined.  Class 3 severe obesity with serious comorbidity and body mass index (BMI) of 40.0 to 44.9 in adult [facility]) Complicates overall care and prognosis.  Recommend lifestyle modifications including physical activity and diet for weight loss and overall long-term health. --Patient attends a wt loss program in community, encouraged to continue this  OSA on CPAP CPAP ordered  Deep vein thrombosis (DVT) of both lower extremities (HCC) Continue warfarin, dose per pharmacy.  Insomnia Continue sleep aid   History of pulmonary  embolism Continue warfarin   Acute on chronic combined systolic and diastolic congestive heart failure, NYHA class 3 (HCC) presented with elevated BNP, with clinical symptoms and findings on xray c/w CHF. Likely due to lack of medicine compliance, had not been taking Lasix recently. Prior Echo Jan 2021 with preserved EF, grade 1 DD. Excellent diuresis with IV Lasix started on admission. ECHO 11/25/20 -- EF 25-30%, global LV hypokinesis, grade 2 diastolic dysfunction --Cardiology consulted, see their recs --Continue IV Lasix at 40 mg BID today, likely transition to PO tomorrow --Strict I/O's & daily weights --Continue Toprol-XL 50 mg daily --Added Losartan 25 mg, Aldactone 25 mg daily, Jardiance 10 mg daily --Hopefully transition to [employer].  TOC consulted for discount program / out of pocket cost. --O2 PRN, keeps sat >90% --Telemetry  Prediabetes A1c 5.8%.  Continue efforts at weight loss and healthy diet.  Will monitor fasting glucose for now.      DVT prophylaxis:  warfarin (COUMADIN) tablet 4.5 mg   Diet:  Diet Orders (From admission, onward)     Start     Ordered   11/24/20 1954  Diet Heart Room service appropriate? Yes; Fluid consistency: Thin  Diet effective now       Question Answer Comment  Room service appropriate? Yes   Fluid consistency: Thin      11/24/20 1954              Code Status: Full Code   Brief Narrative / Hospital Course to Date:   75 y.o. female with medical history significant of HTN, hx of PE and DVTs, systolic and diastolic CHF, OSA,  hypothyroidism, prediabetes, anxiety and depression who presented for SOB, wt gain, increased lower extremity swelling and cough.  Sent to ER from her Healthy weight and wellness appointment when noted to be severely short of breath.   Admitted for acute on chronic combined CHF and question of a-fib with RVR, but this is felt to be sinus tach with PAC's and PVC's    Subjective 11/27/20    Pt seen  today, up in recliner.  Reports just feeling very tired still, but breathing feels a little bit better.  No chest pain, palpitations or other complaints.  Agreeable to HHPT.   Disposition Plan & Communication   Status is: Inpatient  Remains inpatient appropriate because:IV treatments appropriate due to intensity of illness or inability to take PO  Dispo: The patient is from: Home              Anticipated d/c is to: Home              Patient currently is not medically stable to d/c.   Difficult to place patient No     Consults, Procedures, Significant Events   Consultants:  Cardiology  Procedures:  Echo 11/25/20 - EF 87-56%, grade 2 diastolic dysfunction  Antimicrobials:  Anti-infectives (From admission, onward)    None         Micro    Objective   Vitals:   11/26/20 1132 11/26/20 1944 11/27/20 0500 11/27/20 1046  BP: 104/81 106/90 114/76 (!) 124/110  Pulse: 80 64 62 80  Resp: 16 16 18 17   Temp: (!) 97.5 F (36.4 C) (!) 97.4 F (36.3 C) (!) 97.4 F (36.3 C) 98 F (36.7 C)  TempSrc: Oral Oral Oral   SpO2: 94% 96% 90% 98%  Weight:   99.4 kg   Height:        Intake/Output Summary (Last 24 hours) at 11/27/2020 1451 Last data filed at 11/27/2020 1300 Gross per 24 hour  Intake 820 ml  Output 1600 ml  Net -780 ml   Filed Weights   11/25/20 0332 11/26/20 0356 11/27/20 0500  Weight: 104.1 kg 100.9 kg 99.4 kg    Physical Exam:  General exam: up in recliner, awake, alert, no acute distress, obese Respiratory system: CTAB normal respiratory effort, on room air. Cardiovascular system: normal S1/S2, RRR, severe BLE edema chronic but pitting seems improved. Central nervous system: A&O x4. no gross focal neurologic deficits, normal speech Psychiatry: normal mood, congruent affect, judgement and insight appear normal  Labs   Data Reviewed: I have personally reviewed following labs and imaging studies  CBC: Recent Labs  Lab 11/24/20 1631  WBC 9.2  NEUTROABS  5.9  HGB 15.3*  HCT 49.0*  MCV 92.5  PLT 433   Basic Metabolic Panel: Recent Labs  Lab 11/24/20 1631 11/24/20 1956 11/25/20 0457 11/26/20 0408 11/27/20 0412  NA 140  --  140 141 139  K 4.4  --  3.6 3.6 3.4*  CL 109  --  107 104 101  CO2 24  --  25 29 30   GLUCOSE 113*  --  106* 118* 102*  BUN 16  --  15 22 24*  CREATININE 0.95  --  0.95 1.12* 1.07*  CALCIUM 9.4  --  8.8* 8.4* 8.5*  MG  --  1.9  --  1.7 2.3   GFR: Estimated Creatinine Clearance: 50.6 mL/min (A) (by C-G formula based on SCr of 1.07 mg/dL (H)). Liver Function Tests: No results for input(s): AST, ALT, ALKPHOS, BILITOT,  PROT, ALBUMIN in the last 168 hours. No results for input(s): LIPASE, AMYLASE in the last 168 hours. No results for input(s): AMMONIA in the last 168 hours. Coagulation Profile: Recent Labs  Lab 11/24/20 1631 11/25/20 0457 11/26/20 0408 11/27/20 0412  INR 5.5* 4.7* 3.4* 2.4*   Cardiac Enzymes: No results for input(s): CKTOTAL, CKMB, CKMBINDEX, TROPONINI in the last 168 hours. BNP (last 3 results) No results for input(s): PROBNP in the last 8760 hours. HbA1C: No results for input(s): HGBA1C in the last 72 hours. CBG: No results for input(s): GLUCAP in the last 168 hours. Lipid Profile: No results for input(s): CHOL, HDL, LDLCALC, TRIG, CHOLHDL, LDLDIRECT in the last 72 hours. Thyroid Function Tests: Recent Labs    11/25/20 0457  TSH 2.416   Anemia Panel: No results for input(s): VITAMINB12, FOLATE, FERRITIN, TIBC, IRON, RETICCTPCT in the last 72 hours. Sepsis Labs: No results for input(s): PROCALCITON, LATICACIDVEN in the last 168 hours.  Recent Results (from the past 240 hour(s))  Resp Panel by RT-PCR (Flu A&B, Covid) Nasopharyngeal Swab     Status: None   Collection Time: 11/24/20  6:09 PM   Specimen: Nasopharyngeal Swab; Nasopharyngeal(NP) swabs in vial transport medium  Result Value Ref Range Status   SARS Coronavirus 2 by RT PCR NEGATIVE NEGATIVE Final    Comment:  (NOTE) SARS-CoV-2 target nucleic acids are NOT DETECTED.  The SARS-CoV-2 RNA is generally detectable in upper respiratory specimens during the acute phase of infection. The lowest concentration of SARS-CoV-2 viral copies this assay can detect is 138 copies/mL. A negative result does not preclude SARS-Cov-2 infection and should not be used as the sole basis for treatment or other patient management decisions. A negative result may occur with  improper specimen collection/handling, submission of specimen other than nasopharyngeal swab, presence of viral mutation(s) within the areas targeted by this assay, and inadequate number of viral copies(<138 copies/mL). A negative result must be combined with clinical observations, patient history, and epidemiological information. The expected result is Negative.  Fact Sheet for Patients:  EntrepreneurPulse.com.au  Fact Sheet for Healthcare Providers:  IncredibleEmployment.be  This test is no t yet approved or cleared by the [place] FDA and  has been authorized for detection and/or diagnosis of SARS-CoV-2 by FDA under an Emergency Use Authorization (EUA). This EUA will remain  in effect (meaning this test can be used) for the duration of the COVID-19 declaration under Section 564(b)(1) of the Act, 21 U.S.C.section 360bbb-3(b)(1), unless the authorization is terminated  or revoked sooner.       Influenza A by PCR NEGATIVE NEGATIVE Final   Influenza B by PCR NEGATIVE NEGATIVE Final    Comment: (NOTE) The Xpert Xpress SARS-CoV-2/FLU/RSV plus assay is intended as an aid in the diagnosis of influenza from Nasopharyngeal swab specimens and should not be used as a sole basis for treatment. Nasal washings and aspirates are unacceptable for Xpert Xpress SARS-CoV-2/FLU/RSV testing.  Fact Sheet for Patients: EntrepreneurPulse.com.au  Fact Sheet for Healthcare  Providers: IncredibleEmployment.be  This test is not yet approved or cleared by the [place] FDA and has been authorized for detection and/or diagnosis of SARS-CoV-2 by FDA under an Emergency Use Authorization (EUA). This EUA will remain in effect (meaning this test can be used) for the duration of the COVID-19 declaration under Section 564(b)(1) of the Act, 21 U.S.C. section 360bbb-3(b)(1), unless the authorization is terminated or revoked.  Performed at [facility], [place] [address], [place], [place] [address]  Imaging Studies   No results found.   Medications   Scheduled Meds:  empagliflozin  10 mg Oral Daily   furosemide  40 mg Intravenous BID   losartan  25 mg Oral Daily   methimazole  5 mg Oral Daily   metoprolol succinate  50 mg Oral Daily   progesterone  200 mg Oral QHS   sodium chloride flush  3 mL Intravenous Q12H   spironolactone  25 mg Oral Daily   warfarin  4.5 mg Oral ONCE-1600   Warfarin - Pharmacist Dosing Inpatient   Does not apply q1600   zolpidem  5 mg Oral QHS   Continuous Infusions:  sodium chloride         LOS: 3 days    Time spent: 25 minuets with > 50% spent at bedside and in coordination of care.    Ezekiel Slocumb, DO [facility]  11/27/2020, 2:51 PM      If 7PM-7AM, please contact night-coverage. How to contact the [facility] Attending or Consulting provider [place] or covering provider during after hours [place], for this patient?    Check the care team in [facility] and look for a) attending/consulting TR[facility] provider listed and b) the [facility] team listed Log into [email or web address] and use [place]'s universal password to access. If you do not have the password, please contact the hospital operator. Locate the [facility] provider you are looking for under [facility] and page to a number that you can be directly reached. If you still have difficulty reaching the provider, please page the [facility] (Director on  Call) for the Hospitalists listed on amion for assistance.

## 2020-11-27 NOTE — Plan of Care (Signed)

## 2020-11-27 NOTE — Progress Notes (Addendum)
Cardiology Progress Note  Patient ID: Nicole Gibbs MRN: [id number] DOB: [birth year] Date of Encounter: 11/27/2020  Primary Cardiologist: Quay Burow, MD  Subjective   Chief Complaint: Shortness of breath  HPI: Reports breathing still not back to baseline.  Good diuresis overnight.  ROS:  All other ROS reviewed and negative. Pertinent positives noted in the HPI.     Inpatient Medications  Scheduled Meds:  empagliflozin  10 mg Oral Daily   furosemide  40 mg Intravenous BID   losartan  25 mg Oral Daily   methimazole  5 mg Oral Daily   metoprolol succinate  50 mg Oral Daily   potassium chloride  40 mEq Oral Q4H   progesterone  200 mg Oral QHS   sodium chloride flush  3 mL Intravenous Q12H   spironolactone  25 mg Oral Daily   Warfarin - Pharmacist Dosing Inpatient   Does not apply q1600   zolpidem  5 mg Oral QHS   Continuous Infusions:  sodium chloride     PRN Meds: sodium chloride, acetaminophen **OR** acetaminophen, nitroGLYCERIN, sodium chloride flush   Vital Signs   Vitals:   11/26/20 0814 11/26/20 1132 11/26/20 1944 11/27/20 0500  BP: 112/82 104/81 106/90 114/76  Pulse: (!) 42 80 64 62  Resp:  16 16 18   Temp: 98.5 F (36.9 C) (!) 97.5 F (36.4 C) (!) 97.4 F (36.3 C) (!) 97.4 F (36.3 C)  TempSrc: Oral Oral Oral Oral  SpO2: 96% 94% 96% 90%  Weight:    99.4 kg  Height:        Intake/Output Summary (Last 24 hours) at 11/27/2020 0950 Last data filed at 11/27/2020 0900 Gross per 24 hour  Intake 840 ml  Output 2200 ml  Net -1360 ml   Last 3 Weights 11/27/2020 11/26/2020 11/25/2020  Weight (lbs) 219 lb 3.2 oz 222 lb 6.4 oz 229 lb 9.6 oz  Weight (kg) 99.428 kg 100.88 kg 104.146 kg      Telemetry  Overnight telemetry shows sinus rhythm with PACs, which I personally reviewed.   Physical Exam   Vitals:   11/26/20 0814 11/26/20 1132 11/26/20 1944 11/27/20 0500  BP: 112/82 104/81 106/90 114/76  Pulse: (!) 42 80 64 62  Resp:  16 16 18   Temp: 98.5 F (36.9 C)  (!) 97.5 F (36.4 C) (!) 97.4 F (36.3 C) (!) 97.4 F (36.3 C)  TempSrc: Oral Oral Oral Oral  SpO2: 96% 94% 96% 90%  Weight:    99.4 kg  Height:        Intake/Output Summary (Last 24 hours) at 11/27/2020 0950 Last data filed at 11/27/2020 0900 Gross per 24 hour  Intake 840 ml  Output 2200 ml  Net -1360 ml    Last 3 Weights 11/27/2020 11/26/2020 11/25/2020  Weight (lbs) 219 lb 3.2 oz 222 lb 6.4 oz 229 lb 9.6 oz  Weight (kg) 99.428 kg 100.88 kg 104.146 kg    Body mass index is 41.42 kg/m.   General: Well nourished, well developed, in no acute distress Head: Atraumatic, normal size  Eyes: PEERLA, EOMI  Neck: Supple, no JVD Endocrine: No thryomegaly Cardiac: Normal S1, S2; RRR; no murmurs, rubs, or gallops Lungs: Crackles at the lung bases Abd: Soft, nontender, no hepatomegaly  Ext: Trace edema Musculoskeletal: No deformities, BUE and BLE strength normal and equal Skin: Warm and dry, no rashes   Neuro: Alert and oriented to person, place, time, and situation, CNII-XII grossly intact, no focal deficits  Psych: Normal mood and  affect   Labs  High Sensitivity Troponin:   Recent Labs  Lab 11/24/20 1631 11/24/20 1956 11/25/20 0457  TROPONINIHS 49* 50* 65*     Cardiac EnzymesNo results for input(s): TROPONINI in the last 168 hours. No results for input(s): TROPIPOC in the last 168 hours.  Chemistry Recent Labs  Lab 11/25/20 0457 11/26/20 0408 11/27/20 0412  NA 140 141 139  K 3.6 3.6 3.4*  CL 107 104 101  CO2 25 29 30   GLUCOSE 106* 118* 102*  BUN 15 22 24*  CREATININE 0.95 1.12* 1.07*  CALCIUM 8.8* 8.4* 8.5*  GFRNONAA >60 52* 55*  ANIONGAP 8 8 8     Hematology Recent Labs  Lab 11/24/20 1631  WBC 9.2  RBC 5.30*  HGB 15.3*  HCT 49.0*  MCV 92.5  MCH 28.9  MCHC 31.2  RDW 14.4  PLT 284   BNP Recent Labs  Lab 11/24/20 1631  BNP 1,930.9*    DDimer No results for input(s): DDIMER in the last 168 hours.   Radiology  ECHOCARDIOGRAM COMPLETE  Result Date:  11/25/2020    ECHOCARDIOGRAM REPORT   Patient Name:   Nicole Gibbs Date of Exam: 11/25/2020 Medical Rec #:  [id number]   Height:       61.0 in Accession #:    [id number]  Weight:       229.6 lb Date of Birth:  November 24, 1945    BSA:          2.003 m Patient Age:    75 years    BP:           143/95 mmHg Patient Gender: F           HR:           101 bpm. Exam Location:  Inpatient Procedure: 2D Echo, Cardiac Doppler, Color Doppler and Intracardiac            Opacification Agent Indications:    I50.23 Acute on chronic systolic (congestive) heart failure  History:        Patient has prior history of Echocardiogram examinations, most                 recent 06/10/2019. Risk Factors:Hypertension and Sleep Apnea.                 Pulmonary Embolism. Hypothyroidism.  Sonographer:    Jonelle Sidle Dance Referring Phys: [id number] [place]  1. Left ventricular ejection fraction, by estimation, is 25 to 30%. The left ventricle has severely decreased function. The left ventricle demonstrates global hypokinesis. There is mild left ventricular hypertrophy. Left ventricular diastolic parameters  are consistent with Grade II diastolic dysfunction (pseudonormalization).  2. Right ventricular systolic function is mildly reduced. The right ventricular size is normal. There is mildly elevated pulmonary artery systolic pressure. The estimated right ventricular systolic pressure is 29.4 mmHg.  3. Left atrial size was mild to moderately dilated.  4. The mitral valve is normal in structure. Mild mitral valve regurgitation. No evidence of mitral stenosis.  5. The aortic valve is tricuspid. Aortic valve regurgitation is mild. Mild aortic valve sclerosis is present, with no evidence of aortic valve stenosis.  6. The inferior vena cava is dilated in size with >50% respiratory variability, suggesting right atrial pressure of 8 mmHg. FINDINGS  Left Ventricle: Left ventricular ejection fraction, by estimation, is 25 to 30%. The left ventricle has  severely decreased function. The left ventricle demonstrates global hypokinesis. Definity contrast agent was given IV to delineate  the left ventricular endocardial borders. The left ventricular internal cavity size was normal in size. There is mild left ventricular hypertrophy. Left ventricular diastolic parameters are consistent with Grade II diastolic dysfunction (pseudonormalization). Right Ventricle: The right ventricular size is normal. No increase in right ventricular wall thickness. Right ventricular systolic function is mildly reduced. There is mildly elevated pulmonary artery systolic pressure. The tricuspid regurgitant velocity  is 2.80 m/s, and with an assumed right atrial pressure of 8 mmHg, the estimated right ventricular systolic pressure is 70.0 mmHg. Left Atrium: Left atrial size was mild to moderately dilated. Right Atrium: Right atrial size was normal in size. Pericardium: There is no evidence of pericardial effusion. Mitral Valve: The mitral valve is normal in structure. There is mild thickening of the mitral valve leaflet(s). Mild mitral annular calcification. Mild mitral valve regurgitation. No evidence of mitral valve stenosis. Tricuspid Valve: The tricuspid valve is normal in structure. Tricuspid valve regurgitation is mild. Aortic Valve: The aortic valve is tricuspid. Aortic valve regurgitation is mild. Aortic regurgitation PHT measures 419 msec. Mild aortic valve sclerosis is present, with no evidence of aortic valve stenosis. Pulmonic Valve: The pulmonic valve was normal in structure. Pulmonic valve regurgitation is not visualized. Aorta: The aortic root is normal in size and structure. Venous: The inferior vena cava is dilated in size with greater than 50% respiratory variability, suggesting right atrial pressure of 8 mmHg. IAS/Shunts: No atrial level shunt detected by color flow Doppler.  LEFT VENTRICLE PLAX 2D LVIDd:         5.40 cm LVIDs:         4.90 cm LV PW:         1.00 cm LV IVS:         1.10 cm LVOT diam:     2.20 cm LV SV:         79 LV SV Index:   39 LVOT Area:     3.80 cm  RIGHT VENTRICLE          IVC RV Basal diam:  2.80 cm  IVC diam: 2.40 cm TAPSE (M-mode): 1.8 cm LEFT ATRIUM             Index       RIGHT ATRIUM           Index LA diam:        4.50 cm 2.25 cm/m  RA Area:     12.00 cm LA Vol (A2C):   92.2 ml 46.03 ml/m RA Volume:   23.40 ml  11.68 ml/m LA Vol (A4C):   65.3 ml 32.60 ml/m LA Biplane Vol: 78.7 ml 39.29 ml/m  AORTIC VALVE LVOT Vmax:   114.95 cm/s LVOT Vmean:  76.100 cm/s LVOT VTI:    0.207 m AI PHT:      419 msec  AORTA Ao Root diam: 2.90 cm Ao Asc diam:  3.30 cm MITRAL VALVE                TRICUSPID VALVE MV Area (PHT): 3.03 cm     TR Peak grad:   31.4 mmHg MV Decel Time: 250 msec     TR Vmax:        280.00 cm/s MV E velocity: 147.00 cm/s MV A velocity: 104.00 cm/s  SHUNTS MV E/A ratio:  1.41         Systemic VTI:  0.21 m  Systemic Diam: 2.20 cm Loralie Champagne MD Electronically signed by Loralie Champagne MD Signature Date/Time: 11/25/2020/4:28:42 PM    Final     Cardiac Studies  TTE 11/25/2020  1. Left ventricular ejection fraction, by estimation, is 25 to 30%. The  left ventricle has severely decreased function. The left ventricle  demonstrates global hypokinesis. There is mild left ventricular  hypertrophy. Left ventricular diastolic parameters   are consistent with Grade II diastolic dysfunction (pseudonormalization).   2. Right ventricular systolic function is mildly reduced. The right  ventricular size is normal. There is mildly elevated pulmonary artery  systolic pressure. The estimated right ventricular systolic pressure is  25.8 mmHg.   3. Left atrial size was mild to moderately dilated.   4. The mitral valve is normal in structure. Mild mitral valve  regurgitation. No evidence of mitral stenosis.   5. The aortic valve is tricuspid. Aortic valve regurgitation is mild.  Mild aortic valve sclerosis is present, with no  evidence of aortic valve  stenosis.   6. The inferior vena cava is dilated in size with >50% respiratory  variability, suggesting right atrial pressure of 8 mmHg.   Left heart catheterization 09/03/2015: Normal coronary  Patient Profile  Nicole Gibbs is a 75 y.o. female with morbid obesity, OSA, heart failure with reduced ejection fraction (recovery in the past), DVT/PE on Coumadin, normal coronary arteries who was admitted on 11/24/2020 for acute on chronic systolic heart failure.  Assessment & Plan   Acute on chronic systolic heart failure, EF 25-30% -History of nonischemic cardiomyopathy in the past.  Left heart catheterization in 2017 was normal.  Her EF had dropped to 25% and had recovered.  She has been readmitted with recurrent heart failure symptoms and EF has dropped again. -Unclear for the reason for her drop in EF. -Troponins are minimally elevated and flat.  No chest pain symptoms.  I doubt ischemia.  She had a normal left heart cath in 2017. -EKG shows sinus tachycardia with PACs and PVCs.  Possibly PVC related? -She does have hypothyroidism that is treated well methimazole.  TSH is within normal limits.  I do not think this is contributing to her cardiomyopathy. -Given normal left heart cath in the past would continue with guideline directed medical therapy.  We can plan to titrate up therapy and recheck an echo in 3 to 6 months.  She may consider an outpatient ischemia evaluation but I see no need for this given the normal coronaries that were found in 2017. -Continue metoprolol succinate 50 mg daily, losartan 25 mg daily was added today.  She took lisinopril yesterday.  Would likely transition to [facility] tomorrow.  Aldactone 25 mg daily added.  Jardiance 10 mg daily added as well. -She needs 1 more day of IV diuretic therapy.  Continue 40 mg IV twice daily today.  Transition to oral diuretics tomorrow. -Anticipate discharge tomorrow.  2.  Sinus rhythm with PACs/PVCs -Rhythm is  regular.  No A. fib.  May need monitor for PVC burden as an outpatient.  This could be contributing to her cardiomyopathy.  For questions or updates, please contact [place] Please consult [email or web address] for contact info under   Time Spent with Patient: I have spent a total of 35 minutes with patient reviewing hospital notes, telemetry, EKGs, labs and examining the patient as well as establishing an assessment and plan that was discussed with the patient.  > 50% of time was spent in direct patient care.    Signed, [place]  Doreatha Lew, MD, [place]  11/27/2020 9:50 AM

## 2020-11-27 NOTE — Care Management Important Message (Signed)
Important Message  Patient Details  Name: Shree Espey MRN: [id number] Date of Birth: 06/23/1945   Medicare Important Message Given:  Yes     Shelda Altes 11/27/2020, 8:20 AM

## 2020-11-27 NOTE — Discharge Instructions (Signed)
Information on my medicine - Coumadin   (Warfarin)  Why was Coumadin prescribed for you? Coumadin was prescribed for you because you have a blood clot or a medical condition that can cause an increased risk of forming blood clots. Blood clots can cause serious health problems by blocking the flow of blood to the heart, lung, or brain. Coumadin can prevent harmful blood clots from forming. As a reminder your indication for Coumadin is:  stroke prevention due to atrial fibrillation  What test will check on my response to Coumadin? While on Coumadin (warfarin) you will need to have an INR test regularly to ensure that your dose is keeping you in the desired range. The INR (international normalized ratio) number is calculated from the result of the laboratory test called prothrombin time (PT).  If an INR APPOINTMENT HAS NOT ALREADY BEEN MADE FOR YOU please schedule an appointment to have this lab work done by your health care provider within 7 days. Your INR goal is usually a number between:  2 to 3 or your provider may give you a more narrow range like 2-2.5.  Ask your health care provider during an office visit what your goal INR is.  What  do you need to  know  About  COUMADIN? Take Coumadin (warfarin) exactly as prescribed by your healthcare provider about the same time each day.  DO NOT stop taking without talking to the doctor who prescribed the medication.  Stopping without other blood clot prevention medication to take the place of Coumadin may increase your risk of developing a new clot or stroke.  Get refills before you run out.  What do you do if you miss a dose? If you miss a dose, take it as soon as you remember on the same day then continue your regularly scheduled regimen the next day.  Do not take two doses of Coumadin at the same time.  Important Safety Information A possible side effect of Coumadin (Warfarin) is an increased risk of bleeding. You should call your healthcare provider  right away if you experience any of the following: Bleeding from an injury or your nose that does not stop. Unusual colored urine (red or dark brown) or unusual colored stools (red or black). Unusual bruising for unknown reasons. A serious fall or if you hit your head (even if there is no bleeding).  Some foods or medicines interact with Coumadin (warfarin) and might alter your response to warfarin. To help avoid this: Eat a balanced diet, maintaining a consistent amount of Vitamin K. Notify your provider about major diet changes you plan to make. Avoid alcohol or limit your intake to 1 drink for women and 2 drinks for men per day. (1 drink is 5 oz. wine, 12 oz. beer, or 1.5 oz. liquor.)  Make sure that ANY health care provider who prescribes medication for you knows that you are taking Coumadin (warfarin).  Also make sure the healthcare provider who is monitoring your Coumadin knows when you have started a new medication including herbals and non-prescription products.  Coumadin (Warfarin)  Major Drug Interactions  Increased Warfarin Effect Decreased Warfarin Effect  Alcohol (large quantities) Antibiotics (esp. Septra/Bactrim, Flagyl, Cipro) Amiodarone (Cordarone) Aspirin (ASA) Cimetidine (Tagamet) Megestrol (Megace) NSAIDs (ibuprofen, naproxen, etc.) Piroxicam (Feldene) Propafenone (Rythmol SR) Propranolol (Inderal) Isoniazid (INH) Posaconazole (Noxafil) Barbiturates (Phenobarbital) Carbamazepine (Tegretol) Chlordiazepoxide (Librium) Cholestyramine (Questran) Griseofulvin Oral Contraceptives Rifampin Sucralfate (Carafate) Vitamin K   Coumadin (Warfarin) Major Herbal Interactions  Increased Warfarin Effect Decreased Warfarin Effect    Garlic Ginseng Ginkgo biloba Coenzyme Q10 Green tea St. John's wort    Coumadin (Warfarin) FOOD Interactions  Eat a consistent number of servings per week of foods HIGH in Vitamin K (1 serving =  cup)  Collards (cooked, or boiled &  drained) Kale (cooked, or boiled & drained) Mustard greens (cooked, or boiled & drained) Parsley *serving size only =  cup Spinach (cooked, or boiled & drained) Swiss chard (cooked, or boiled & drained) Turnip greens (cooked, or boiled & drained)  Eat a consistent number of servings per week of foods MEDIUM-HIGH in Vitamin K (1 serving = 1 cup)  Asparagus (cooked, or boiled & drained) Broccoli (cooked, boiled & drained, or raw & chopped) Brussel sprouts (cooked, or boiled & drained) *serving size only =  cup Lettuce, raw (green leaf, endive, romaine) Spinach, raw Turnip greens, raw & chopped   These websites have more information on Coumadin (warfarin):  www.coumadin.com; www.ahrq.gov/consumer/coumadin.htm;   

## 2020-11-28 DIAGNOSIS — R600 Localized edema: Secondary | ICD-10-CM

## 2020-11-28 DIAGNOSIS — I5043 Acute on chronic combined systolic (congestive) and diastolic (congestive) heart failure: Secondary | ICD-10-CM | POA: Diagnosis not present

## 2020-11-28 DIAGNOSIS — Z86711 Personal history of pulmonary embolism: Secondary | ICD-10-CM | POA: Diagnosis not present

## 2020-11-28 DIAGNOSIS — I10 Essential (primary) hypertension: Secondary | ICD-10-CM | POA: Diagnosis not present

## 2020-11-28 LAB — BASIC METABOLIC PANEL
Anion gap: 7 (ref 5–15)
BUN: 26 mg/dL — ABNORMAL HIGH (ref 8–23)
CO2: 28 mmol/L (ref 22–32)
Calcium: 8.6 mg/dL — ABNORMAL LOW (ref 8.9–10.3)
Chloride: 103 mmol/L (ref 98–111)
Creatinine, Ser: 1.07 mg/dL — ABNORMAL HIGH (ref 0.44–1.00)
GFR, Estimated: 55 mL/min — ABNORMAL LOW (ref >60)
Glucose, Bld: 102 mg/dL — ABNORMAL HIGH (ref 70–99)
Potassium: 4 mmol/L (ref 3.5–5.1)
Sodium: 138 mmol/L (ref 135–145)

## 2020-11-28 LAB — PROTIME-INR
INR: 2.5 — ABNORMAL HIGH (ref 0.8–1.2)
Prothrombin Time: 26.6 seconds — ABNORMAL HIGH (ref 11.4–15.2)

## 2020-11-28 MED ORDER — FUROSEMIDE 40 MG PO TABS
40.0000 mg | ORAL_TABLET | Freq: Every day | ORAL | Status: DC
  Administered 2020-11-28 – 2020-11-30 (×3): 40 mg via ORAL
  Filled 2020-11-28 (×3): qty 1

## 2020-11-28 MED ORDER — SACUBITRIL-VALSARTAN 24-26 MG PO TABS
1.0000 | ORAL_TABLET | Freq: Two times a day (BID) | ORAL | Status: DC
  Administered 2020-11-28 – 2020-11-30 (×5): 1 via ORAL
  Filled 2020-11-28 (×5): qty 1

## 2020-11-28 MED ORDER — WARFARIN SODIUM 3 MG PO TABS
3.0000 mg | ORAL_TABLET | Freq: Once | ORAL | Status: AC
  Administered 2020-11-28: 3 mg via ORAL
  Filled 2020-11-28: qty 1

## 2020-11-28 NOTE — Progress Notes (Signed)
ANTICOAGULATION CONSULT NOTE - Follow Up Consult  Pharmacy Consult for Warfarin Indication: history of pulmonary embolus and DVT  Allergies  Allergen Reactions   Keflex [Cephalexin] Diarrhea   Sulfa Antibiotics Hives    Patient Measurements: Height: 5\' 1"  (154.9 cm) Weight: 99.7 kg (219 lb 14.4 oz) IBW/kg (Calculated) : 47.8  Vital Signs: Temp: 97.7 F (36.5 C) (07/16 0528) Temp Source: Oral (07/16 0528) BP: 98/60 (07/16 0800) Pulse Rate: 95 (07/16 0528)  Labs: Recent Labs    11/26/20 0408 11/27/20 0412 11/28/20 0616  LABPROT 34.0* 26.4* 26.6*  INR 3.4* 2.4* 2.5*  CREATININE 1.12* 1.07* 1.07*     Estimated Creatinine Clearance: 50.7 mL/min (A) (by C-G formula based on SCr of 1.07 mg/dL (H)).  Assessment: 75 yr old female continues on Warfarin as PTA for hx pulmonary embolism and DVT.   INR 5.5 on admit and warfarin held 7/12 and 7/13.   PTA warfarin regimen: 4.5 mg MWF, 3 mg TTSS - last outpatient INR 3.5 on 10/14/20 on same regimen; prior INRs had been therapeutic on same regimen and dose was not adjusted.  INR today came back at 2.5. No s/sx of bleeding documented. Oral intake documented at 100%.   Goal of Therapy:  INR 2-3 Monitor platelets by anticoagulation protocol: Yes   Plan:  Warfarin 3 mg today (usual Saturday dose). Daily PT/INR for now.  Antonietta Jewel, PharmD, [place] Clinical Pharmacist  Phone: [phone] 11/28/2020 8:54 AM  Please check AMION for all [facility] phone numbers After 10:00 PM, call [address] [phone]

## 2020-11-28 NOTE — Progress Notes (Signed)
Cardiology Progress Note  Patient ID: Nicole Gibbs MRN: [id number] DOB: 1945/08/17 Date of Encounter: 11/28/2020  Primary Cardiologist: Quay Burow, MD  Subjective   Chief Complaint: Shortness of breath  HPI: Reports she did not sleep well.  Unclear if she go home.  Reports she is very fatigued.  Net -1.2 L.  Net -6.4 L since admission.  ROS:  All other ROS reviewed and negative. Pertinent positives noted in the HPI.     Inpatient Medications  Scheduled Meds:  empagliflozin  10 mg Oral Daily   furosemide  40 mg Oral Daily   methimazole  5 mg Oral Daily   metoprolol succinate  50 mg Oral Daily   progesterone  200 mg Oral QHS   sacubitril-valsartan  1 tablet Oral BID   sodium chloride flush  3 mL Intravenous Q12H   spironolactone  25 mg Oral Daily   Warfarin - Pharmacist Dosing Inpatient   Does not apply q1600   zolpidem  5 mg Oral QHS   Continuous Infusions:  sodium chloride     PRN Meds: sodium chloride, acetaminophen **OR** acetaminophen, nitroGLYCERIN, sodium chloride flush   Vital Signs   Vitals:   11/27/20 1046 11/27/20 1936 11/28/20 0331 11/28/20 0528  BP: (!) 124/110 98/69  (!) 128/99  Pulse: 80 61  95  Resp: 17 20  (!) 22  Temp: 98 F (36.7 C) (!) 97.5 F (36.4 C)  97.7 F (36.5 C)  TempSrc:  Oral  Oral  SpO2: 98% 94%  92%  Weight:   99.7 kg   Height:        Intake/Output Summary (Last 24 hours) at 11/28/2020 0834 Last data filed at 11/28/2020 5284 Gross per 24 hour  Intake 640 ml  Output 1900 ml  Net -1260 ml   Last 3 Weights 11/28/2020 11/27/2020 11/26/2020  Weight (lbs) 219 lb 14.4 oz 219 lb 3.2 oz 222 lb 6.4 oz  Weight (kg) 99.746 kg 99.428 kg 100.88 kg      Telemetry  Overnight telemetry shows sinus rhythm in the 90s, PACs, which I personally reviewed.   Physical Exam   Vitals:   11/27/20 1046 11/27/20 1936 11/28/20 0331 11/28/20 0528  BP: (!) 124/110 98/69  (!) 128/99  Pulse: 80 61  95  Resp: 17 20  (!) 22  Temp: 98 F (36.7 C) (!)  97.5 F (36.4 C)  97.7 F (36.5 C)  TempSrc:  Oral  Oral  SpO2: 98% 94%  92%  Weight:   99.7 kg   Height:        Intake/Output Summary (Last 24 hours) at 11/28/2020 0834 Last data filed at 11/28/2020 1324 Gross per 24 hour  Intake 640 ml  Output 1900 ml  Net -1260 ml    Last 3 Weights 11/28/2020 11/27/2020 11/26/2020  Weight (lbs) 219 lb 14.4 oz 219 lb 3.2 oz 222 lb 6.4 oz  Weight (kg) 99.746 kg 99.428 kg 100.88 kg    Body mass index is 41.55 kg/m.  General: Well nourished, well developed, in no acute distress Head: Atraumatic, normal size  Eyes: PEERLA, EOMI  Neck: Supple, no JVD Endocrine: No thryomegaly Cardiac: Normal S1, S2; RRR; no murmurs, rubs, or gallops Lungs: Diminished breath sounds bilaterally Abd: Soft, nontender, no hepatomegaly  Ext: Trace edema Musculoskeletal: No deformities, BUE and BLE strength normal and equal Skin: Warm and dry, no rashes   Neuro: Alert and oriented to person, place, time, and situation, CNII-XII grossly intact, no focal deficits  Psych: Normal  mood and affect   Labs  High Sensitivity Troponin:   Recent Labs  Lab 11/24/20 1631 11/24/20 1956 11/25/20 0457  TROPONINIHS 49* 50* 65*     Cardiac EnzymesNo results for input(s): TROPONINI in the last 168 hours. No results for input(s): TROPIPOC in the last 168 hours.  Chemistry Recent Labs  Lab 11/26/20 0408 11/27/20 0412 11/28/20 0616  NA 141 139 138  K 3.6 3.4* 4.0  CL 104 101 103  CO2 29 30 28   GLUCOSE 118* 102* 102*  BUN 22 24* 26*  CREATININE 1.12* 1.07* 1.07*  CALCIUM 8.4* 8.5* 8.6*  GFRNONAA 52* 55* 55*  ANIONGAP 8 8 7     Hematology Recent Labs  Lab 11/24/20 1631  WBC 9.2  RBC 5.30*  HGB 15.3*  HCT 49.0*  MCV 92.5  MCH 28.9  MCHC 31.2  RDW 14.4  PLT 284   BNP Recent Labs  Lab 11/24/20 1631  BNP 1,930.9*    DDimer No results for input(s): DDIMER in the last 168 hours.   Radiology  No results found.  Cardiac Studies  TTE 11/25/2020  1. Left  ventricular ejection fraction, by estimation, is 25 to 30%. The  left ventricle has severely decreased function. The left ventricle  demonstrates global hypokinesis. There is mild left ventricular  hypertrophy. Left ventricular diastolic parameters   are consistent with Grade II diastolic dysfunction (pseudonormalization).   2. Right ventricular systolic function is mildly reduced. The right  ventricular size is normal. There is mildly elevated pulmonary artery  systolic pressure. The estimated right ventricular systolic pressure is  58.0 mmHg.   3. Left atrial size was mild to moderately dilated.   4. The mitral valve is normal in structure. Mild mitral valve  regurgitation. No evidence of mitral stenosis.   5. The aortic valve is tricuspid. Aortic valve regurgitation is mild.  Mild aortic valve sclerosis is present, with no evidence of aortic valve  stenosis.   6. The inferior vena cava is dilated in size with >50% respiratory  variability, suggesting right atrial pressure of 8 mmHg.   Patient Profile  Nicole Gibbs is a 75 y.o. female with morbid obesity, OSA, heart failure with reduced ejection fraction (recovery in the past), DVT/PE on Coumadin, normal coronary arteries who was admitted on 11/24/2020 for acute on chronic systolic heart failure.  Assessment & Plan   Acute on chronic systolic heart failure, EF 25-30% -History of nonischemic cardiomyopathy in the past.  Left heart cath in 2017 with normal coronaries.  EF had recovered.  Readmitted with heart failure and drop in EF. -Troponins are minimally elevated and flat.  I do not suspect an ischemic etiology. -EKG with PACs and PVCs, possibly PVC mediated. -She does have hyperthyroidism and is on methimazole.  TSH within limits.  Not contributing. -Given normal left heart cath in the past and no chest pain symptoms with normal troponins would recommend medical therapy. -She did receive lisinopril earlier this admission.  She has been  off for 48 hours.  Transition to Entresto 24-26 mg twice daily.  Continue Aldactone 25 mg daily.  Continue metoprolol succinate 50 mg daily.  Continue Jardiance 10 mg daily. -She appears effectively euvolemic to me.  Net -6.4 L since admission.  Weights are down.  We will transition to 40 mg p.o. Lasix today. -Discharge today or tomorrow.  2.  Normal sinus rhythm with PACs/PVCs -No A. fib. -Would continue with beta-blocker titration to suppress PVCs.  May need monitor as an outpatient  to determine if PVCs are contributing to her cardiomyopathy.  For questions or updates, please contact [facility] Please consult [email or web address] for contact info under   Time Spent with Patient: I have spent a total of 25 minutes with patient reviewing hospital notes, telemetry, EKGs, labs and examining the patient as well as establishing an assessment and plan that was discussed with the patient.  > 50% of time was spent in direct patient care.    Signed, Addison Naegeli. Audie Box, MD, [place]  11/28/2020 8:34 AM

## 2020-11-28 NOTE — Progress Notes (Addendum)
PROGRESS NOTE    Nicole Gibbs   OTL:[id number]  DOB: [birth year]  PCP: Holland Commons, FNP    DOA: 11/24/2020 LOS: 4   Assessment & Plan   Principal Problem:   Acute on chronic combined systolic and diastolic congestive heart failure, NYHA class 3 (HCC) Active Problems:   Supratherapeutic INR   Essential hypertension   Dyspnea on exertion   Deep vein thrombosis (DVT) of both lower extremities (HCC)   History of pulmonary embolism   Prediabetes   Lower extremity edema   Insomnia   OSA on CPAP   Class 3 severe obesity with serious comorbidity and body mass index (BMI) of 40.0 to 44.9 in adult [facility])   Hyperthyroidism  Hyperthyroidism TSH within normal this admission. --Continue methimazole.  Dyspnea on exertion Chronic and multifactorial due to CHF, deconditioning, obesity, OAS.  Acutely worse in setting of volume overload w/ decomensated CHF.   Essential hypertension Currently with soft BP's in setting of diuresis and addition of cardiac meds for HFrEF. Was taking lisinopril and Toprol PTA, had been noncompliant with Lasix. Now on Toprol-XL, Aldactone, Entresto and Lasix  Supratherapeutic INR Resolved.  Presented with INR of 5.5.   On warfarin chronically for hx of PE's and bilateral DVT's. --Warfarin resumed, dosing per pharmacy --Daily INR's  Lower extremity edema Chronic, due to hx of bilateral DVT's.  Acutely worse with decompensated CHF. Diuresing as outlined.  Class 3 severe obesity with serious comorbidity and body mass index (BMI) of 40.0 to 44.9 in adult [facility]) Complicates overall care and prognosis.  Recommend lifestyle modifications including physical activity and diet for weight loss and overall long-term health. --Patient attends a wt loss program in community, encouraged to continue this  OSA on CPAP CPAP ordered  Deep vein thrombosis (DVT) of both lower extremities (HCC) Continue warfarin, dose per pharmacy.  Insomnia Continue sleep aid    History of pulmonary embolism Continue warfarin   Acute on chronic combined systolic and diastolic congestive heart failure, NYHA class 3 (HCC) presented with elevated BNP, with clinical symptoms and findings on xray c/w CHF. Likely due to lack of medicine compliance, had not been taking Lasix recently. Prior Echo Jan 2021 with preserved EF, grade 1 DD. Excellent diuresis with IV Lasix started on admission. ECHO 11/25/20 -- EF 25-30%, global LV hypokinesis, grade 2 diastolic dysfunction Net IO Since Admission: -6,670 mL [11/28/20 1146] --Cardiology consulted, see their recs --Initially diuresed with IV Lasix at 40 mg BID  --Transition to oral Lasix 40 mg daily today  --Strict I/O's & daily weights --Continue Toprol-XL 50 mg daily --Added Aldactone 25 mg daily, Jardiance 10 mg daily --Entresto started today. --TOC consulted for Entresto discount program / out of pocket cost. --O2 PRN, keeps sat >90% --Telemetry  Prediabetes A1c 5.8%.  Continue efforts at weight loss and healthy diet.  Will monitor fasting glucose for now.      DVT prophylaxis:  warfarin (COUMADIN) tablet 3 mg   Diet:  Diet Orders (From admission, onward)     Start     Ordered   11/24/20 1954  Diet Heart Room service appropriate? Yes; Fluid consistency: Thin  Diet effective now       Question Answer Comment  Room service appropriate? Yes   Fluid consistency: Thin      11/24/20 1954              Code Status: Full Code   Brief Narrative / Hospital Course to Date:   75 y.o. female  with medical history significant of HTN, hx of PE and DVTs, systolic and diastolic CHF, OSA, hypothyroidism, prediabetes, anxiety and depression who presented for SOB, wt gain, increased lower extremity swelling and cough.  Sent to ER from her Healthy weight and wellness appointment when noted to be severely short of breath.   Admitted for acute on chronic combined CHF and question of a-fib with RVR, but this is felt to  be sinus tach with PAC's and PVC's    Subjective 11/28/20    Pt was sleeping in the bed but woke easily to voice. Didn't sleep well overnight, thinks maybe due to anxiety.  Feels very fatigued.  This AM having some dizziness and lightheadedness with soft BP.   Disposition Plan & Communication   Status is: Inpatient  Remains inpatient appropriate because: BP's soft and changes in cardiac meds today, warrants close monitoring.  Dispo: The patient is from: Home              Anticipated d/c is to: Home              Patient currently is not medically stable to d/c.   Difficult to place patient No     Consults, Procedures, Significant Events   Consultants:  Cardiology  Procedures:  Echo 11/25/20 - EF 64-68%, grade 2 diastolic dysfunction  Antimicrobials:  Anti-infectives (From admission, onward)    None         Micro    Objective   Vitals:   11/27/20 1936 11/28/20 0331 11/28/20 0528 11/28/20 0800  BP: 98/69  (!) 128/99 98/60  Pulse: 61  95   Resp: 20  (!) 22 20  Temp: (!) 97.5 F (36.4 C)  97.7 F (36.5 C)   TempSrc: Oral  Oral   SpO2: 94%  92% 100%  Weight:  99.7 kg    Height:        Intake/Output Summary (Last 24 hours) at 11/28/2020 1150 Last data filed at 11/28/2020 1106 Gross per 24 hour  Intake 520 ml  Output 2250 ml  Net -1730 ml   Filed Weights   11/26/20 0356 11/27/20 0500 11/28/20 0331  Weight: 100.9 kg 99.4 kg 99.7 kg    Physical Exam:  General exam: sleeping wakes easily to voice, no acute distress, obese Respiratory system: CTAB normal respiratory effort, on room air, no wheezes rales or rhonchi. Cardiovascular system: normal S1/S2, RRR, BLE edema chronic somewhat improved. Central nervous system: A&O x4. no gross focal neurologic deficits, normal speech Psychiatry: normal mood, congruent affect, judgement and insight appear normal  Labs   Data Reviewed: I have personally reviewed following labs and imaging studies  CBC: Recent Labs   Lab 11/24/20 1631  WBC 9.2  NEUTROABS 5.9  HGB 15.3*  HCT 49.0*  MCV 92.5  PLT 032   Basic Metabolic Panel: Recent Labs  Lab 11/24/20 1631 11/24/20 1956 11/25/20 0457 11/26/20 0408 11/27/20 0412 11/28/20 0616  NA 140  --  140 141 139 138  K 4.4  --  3.6 3.6 3.4* 4.0  CL 109  --  107 104 101 103  CO2 24  --  25 29 30 28   GLUCOSE 113*  --  106* 118* 102* 102*  BUN 16  --  15 22 24* 26*  CREATININE 0.95  --  0.95 1.12* 1.07* 1.07*  CALCIUM 9.4  --  8.8* 8.4* 8.5* 8.6*  MG  --  1.9  --  1.7 2.3  --    GFR: Estimated  Creatinine Clearance: 50.7 mL/min (A) (by C-G formula based on SCr of 1.07 mg/dL (H)). Liver Function Tests: No results for input(s): AST, ALT, ALKPHOS, BILITOT, PROT, ALBUMIN in the last 168 hours. No results for input(s): LIPASE, AMYLASE in the last 168 hours. No results for input(s): AMMONIA in the last 168 hours. Coagulation Profile: Recent Labs  Lab 11/24/20 1631 11/25/20 0457 11/26/20 0408 11/27/20 0412 11/28/20 0616  INR 5.5* 4.7* 3.4* 2.4* 2.5*   Cardiac Enzymes: No results for input(s): CKTOTAL, CKMB, CKMBINDEX, TROPONINI in the last 168 hours. BNP (last 3 results) No results for input(s): PROBNP in the last 8760 hours. HbA1C: No results for input(s): HGBA1C in the last 72 hours. CBG: No results for input(s): GLUCAP in the last 168 hours. Lipid Profile: No results for input(s): CHOL, HDL, LDLCALC, TRIG, CHOLHDL, LDLDIRECT in the last 72 hours. Thyroid Function Tests: No results for input(s): TSH, T4TOTAL, FREET4, T3FREE, THYROIDAB in the last 72 hours.  Anemia Panel: No results for input(s): VITAMINB12, FOLATE, FERRITIN, TIBC, IRON, RETICCTPCT in the last 72 hours. Sepsis Labs: No results for input(s): PROCALCITON, LATICACIDVEN in the last 168 hours.  Recent Results (from the past 240 hour(s))  Resp Panel by RT-PCR (Flu A&B, Covid) Nasopharyngeal Swab     Status: None   Collection Time: 11/24/20  6:09 PM   Specimen: Nasopharyngeal  Swab; Nasopharyngeal(NP) swabs in vial transport medium  Result Value Ref Range Status   SARS Coronavirus 2 by RT PCR NEGATIVE NEGATIVE Final    Comment: (NOTE) SARS-CoV-2 target nucleic acids are NOT DETECTED.  The SARS-CoV-2 RNA is generally detectable in upper respiratory specimens during the acute phase of infection. The lowest concentration of SARS-CoV-2 viral copies this assay can detect is 138 copies/mL. A negative result does not preclude SARS-Cov-2 infection and should not be used as the sole basis for treatment or other patient management decisions. A negative result may occur with  improper specimen collection/handling, submission of specimen other than nasopharyngeal swab, presence of viral mutation(s) within the areas targeted by this assay, and inadequate number of viral copies(<138 copies/mL). A negative result must be combined with clinical observations, patient history, and epidemiological information. The expected result is Negative.  Fact Sheet for Patients:  EntrepreneurPulse.com.au  Fact Sheet for Healthcare Providers:  IncredibleEmployment.be  This test is no t yet approved or cleared by the [place] FDA and  has been authorized for detection and/or diagnosis of SARS-CoV-2 by FDA under an Emergency Use Authorization (EUA). This EUA will remain  in effect (meaning this test can be used) for the duration of the COVID-19 declaration under Section 564(b)(1) of the Act, 21 U.S.C.section 360bbb-3(b)(1), unless the authorization is terminated  or revoked sooner.       Influenza A by PCR NEGATIVE NEGATIVE Final   Influenza B by PCR NEGATIVE NEGATIVE Final    Comment: (NOTE) The Xpert Xpress SARS-CoV-2/FLU/RSV plus assay is intended as an aid in the diagnosis of influenza from Nasopharyngeal swab specimens and should not be used as a sole basis for treatment. Nasal washings and aspirates are unacceptable for Xpert Xpress  SARS-CoV-2/FLU/RSV testing.  Fact Sheet for Patients: EntrepreneurPulse.com.au  Fact Sheet for Healthcare Providers: IncredibleEmployment.be  This test is not yet approved or cleared by the [place] FDA and has been authorized for detection and/or diagnosis of SARS-CoV-2 by FDA under an Emergency Use Authorization (EUA). This EUA will remain in effect (meaning this test can be used) for the duration of the COVID-19 declaration under Section  564(b)(1) of the Act, 21 U.S.C. section 360bbb-3(b)(1), unless the authorization is terminated or revoked.  Performed at [facility], [place] [address] [place], [place] [address]       Imaging Studies   No results found.   Medications   Scheduled Meds:  empagliflozin  10 mg Oral Daily   furosemide  40 mg Oral Daily   methimazole  5 mg Oral Daily   metoprolol succinate  50 mg Oral Daily   progesterone  200 mg Oral QHS   sacubitril-valsartan  1 tablet Oral BID   sodium chloride flush  3 mL Intravenous Q12H   spironolactone  25 mg Oral Daily   warfarin  3 mg Oral ONCE-1600   Warfarin - Pharmacist Dosing Inpatient   Does not apply q1600   zolpidem  5 mg Oral QHS   Continuous Infusions:  sodium chloride         LOS: 4 days    Time spent: 25 minuets with > 50% spent at bedside and in coordination of care.    Ezekiel Slocumb, DO [facility]  11/28/2020, 11:50 AM      If 7PM-7AM, please contact night-coverage. How to contact the [facility] Attending or Consulting provider [place] or covering provider during after hours [place], for this patient?    Check the care team in [facility] and look for a) attending/consulting [facility] provider listed and b) the [facility] team listed Log into [email or web address] and use [place]'s universal password to access. If you do not have the password, please contact the hospital operator. Locate the [facility] provider you are looking for under [facility] and page  to a number that you can be directly reached. If you still have difficulty reaching the provider, please page the [facility] (Director on Call) for the Hospitalists listed on amion for assistance.

## 2020-11-29 ENCOUNTER — Inpatient Hospital Stay (HOSPITAL_COMMUNITY): Payer: Medicare Other

## 2020-11-29 DIAGNOSIS — I5043 Acute on chronic combined systolic (congestive) and diastolic (congestive) heart failure: Secondary | ICD-10-CM | POA: Diagnosis not present

## 2020-11-29 LAB — CBC
HCT: 47.7 % — ABNORMAL HIGH (ref 36.0–46.0)
Hemoglobin: 15.2 g/dL — ABNORMAL HIGH (ref 12.0–15.0)
MCH: 29 pg (ref 26.0–34.0)
MCHC: 31.9 g/dL (ref 30.0–36.0)
MCV: 91 fL (ref 80.0–100.0)
Platelets: 303 10*3/uL (ref 150–400)
RBC: 5.24 MIL/uL — ABNORMAL HIGH (ref 3.87–5.11)
RDW: 14.4 % (ref 11.5–15.5)
WBC: 11.6 10*3/uL — ABNORMAL HIGH (ref 4.0–10.5)
nRBC: 0 % (ref 0.0–0.2)

## 2020-11-29 LAB — RESPIRATORY PANEL BY PCR

## 2020-11-29 LAB — BASIC METABOLIC PANEL
Anion gap: 11 (ref 5–15)
BUN: 25 mg/dL — ABNORMAL HIGH (ref 8–23)
CO2: 28 mmol/L (ref 22–32)
Calcium: 8.8 mg/dL — ABNORMAL LOW (ref 8.9–10.3)
Chloride: 100 mmol/L (ref 98–111)
Creatinine, Ser: 1.05 mg/dL — ABNORMAL HIGH (ref 0.44–1.00)
GFR, Estimated: 56 mL/min — ABNORMAL LOW (ref >60)
Glucose, Bld: 99 mg/dL (ref 70–99)
Potassium: 4.3 mmol/L (ref 3.5–5.1)
Sodium: 139 mmol/L (ref 135–145)

## 2020-11-29 LAB — PROTIME-INR
INR: 2.6 — ABNORMAL HIGH (ref 0.8–1.2)
Prothrombin Time: 27.8 seconds — ABNORMAL HIGH (ref 11.4–15.2)

## 2020-11-29 LAB — MAGNESIUM: Magnesium: 2.4 mg/dL (ref 1.7–2.4)

## 2020-11-29 MED ORDER — DM-GUAIFENESIN ER 30-600 MG PO TB12
1.0000 | ORAL_TABLET | Freq: Two times a day (BID) | ORAL | Status: DC
  Administered 2020-11-29 – 2020-11-30 (×3): 1 via ORAL
  Filled 2020-11-29 (×3): qty 1

## 2020-11-29 MED ORDER — WARFARIN SODIUM 3 MG PO TABS
3.0000 mg | ORAL_TABLET | Freq: Once | ORAL | Status: AC
  Administered 2020-11-29: 3 mg via ORAL
  Filled 2020-11-29: qty 1

## 2020-11-29 MED ORDER — METOPROLOL SUCCINATE ER 100 MG PO TB24
100.0000 mg | ORAL_TABLET | Freq: Every day | ORAL | Status: DC
  Administered 2020-11-29 – 2020-11-30 (×2): 100 mg via ORAL
  Filled 2020-11-29 (×2): qty 1

## 2020-11-29 MED ORDER — BENZONATATE 100 MG PO CAPS
200.0000 mg | ORAL_CAPSULE | Freq: Three times a day (TID) | ORAL | Status: DC | PRN
  Administered 2020-11-29 (×2): 200 mg via ORAL
  Filled 2020-11-29 (×2): qty 2

## 2020-11-29 MED ORDER — FAMOTIDINE 20 MG PO TABS
20.0000 mg | ORAL_TABLET | Freq: Every day | ORAL | Status: DC
  Administered 2020-11-29: 20 mg via ORAL
  Filled 2020-11-29: qty 1

## 2020-11-29 NOTE — Progress Notes (Signed)
Cardiology Progress Note  Patient ID: Nicole Gibbs MRN: [id number] DOB: March 25, 1946 Date of Encounter: 11/29/2020  Primary Cardiologist: Quay Burow, MD  Subjective   Chief Complaint: None.  HPI: Tolerating Entresto well.  On Aldactone.  Good response to torsemide.  Stable for discharge.  ROS:  All other ROS reviewed and negative. Pertinent positives noted in the HPI.     Inpatient Medications  Scheduled Meds:  empagliflozin  10 mg Oral Daily   furosemide  40 mg Oral Daily   methimazole  5 mg Oral Daily   metoprolol succinate  50 mg Oral Daily   progesterone  200 mg Oral QHS   sacubitril-valsartan  1 tablet Oral BID   sodium chloride flush  3 mL Intravenous Q12H   spironolactone  25 mg Oral Daily   Warfarin - Pharmacist Dosing Inpatient   Does not apply q1600   zolpidem  5 mg Oral QHS   Continuous Infusions:  sodium chloride     PRN Meds: sodium chloride, acetaminophen **OR** acetaminophen, nitroGLYCERIN, sodium chloride flush   Vital Signs   Vitals:   11/28/20 1642 11/28/20 2015 11/29/20 0408 11/29/20 0755  BP: 96/65 103/76 119/82 (!) 136/125  Pulse: 74 81 81 90  Resp: 20 17 18 16   Temp: 98 F (36.7 C) (!) 97.5 F (36.4 C) (!) 97.5 F (36.4 C) 98.7 F (37.1 C)  TempSrc: Oral Oral Oral Oral  SpO2: 100% 96% 92% 92%  Weight:   99.6 kg   Height:        Intake/Output Summary (Last 24 hours) at 11/29/2020 0812 Last data filed at 11/29/2020 0415 Gross per 24 hour  Intake 840 ml  Output 2050 ml  Net -1210 ml   Last 3 Weights 11/29/2020 11/28/2020 11/27/2020  Weight (lbs) 219 lb 9.6 oz 219 lb 14.4 oz 219 lb 3.2 oz  Weight (kg) 99.61 kg 99.746 kg 99.428 kg      Telemetry  Overnight telemetry shows sinus rhythm with frequent PVCs, which I personally reviewed.   Physical Exam   Vitals:   11/28/20 1642 11/28/20 2015 11/29/20 0408 11/29/20 0755  BP: 96/65 103/76 119/82 (!) 136/125  Pulse: 74 81 81 90  Resp: 20 17 18 16   Temp: 98 F (36.7 C) (!) 97.5 F (36.4  C) (!) 97.5 F (36.4 C) 98.7 F (37.1 C)  TempSrc: Oral Oral Oral Oral  SpO2: 100% 96% 92% 92%  Weight:   99.6 kg   Height:        Intake/Output Summary (Last 24 hours) at 11/29/2020 0812 Last data filed at 11/29/2020 0415 Gross per 24 hour  Intake 840 ml  Output 2050 ml  Net -1210 ml    Last 3 Weights 11/29/2020 11/28/2020 11/27/2020  Weight (lbs) 219 lb 9.6 oz 219 lb 14.4 oz 219 lb 3.2 oz  Weight (kg) 99.61 kg 99.746 kg 99.428 kg    Body mass index is 41.49 kg/m.   General: Well nourished, well developed, in no acute distress Head: Atraumatic, normal size  Eyes: PEERLA, EOMI  Neck: Supple, no JVD Endocrine: No thryomegaly Cardiac: Normal S1, S2; RRR; no murmurs, rubs, or gallops Lungs: Clear to auscultation bilaterally, no wheezing, rhonchi or rales  Abd: Soft, nontender, no hepatomegaly  Ext: No edema, pulses 2+ Musculoskeletal: No deformities, BUE and BLE strength normal and equal Skin: Warm and dry, no rashes   Neuro: Alert and oriented to person, place, time, and situation, CNII-XII grossly intact, no focal deficits  Psych: Normal mood and affect  Labs  High Sensitivity Troponin:   Recent Labs  Lab 11/24/20 1631 11/24/20 1956 11/25/20 0457  TROPONINIHS 49* 50* 65*     Cardiac EnzymesNo results for input(s): TROPONINI in the last 168 hours. No results for input(s): TROPIPOC in the last 168 hours.  Chemistry Recent Labs  Lab 11/27/20 0412 11/28/20 0616 11/29/20 0035  NA 139 138 139  K 3.4* 4.0 4.3  CL 101 103 100  CO2 30 28 28   GLUCOSE 102* 102* 99  BUN 24* 26* 25*  CREATININE 1.07* 1.07* 1.05*  CALCIUM 8.5* 8.6* 8.8*  GFRNONAA 55* 55* 56*  ANIONGAP 8 7 11     Hematology Recent Labs  Lab 11/24/20 1631 11/29/20 0035  WBC 9.2 11.6*  RBC 5.30* 5.24*  HGB 15.3* 15.2*  HCT 49.0* 47.7*  MCV 92.5 91.0  MCH 28.9 29.0  MCHC 31.2 31.9  RDW 14.4 14.4  PLT 284 303   BNP Recent Labs  Lab 11/24/20 1631  BNP 1,930.9*    DDimer No results for  input(s): DDIMER in the last 168 hours.   Radiology  No results found.  Cardiac Studies  TTE 11/25/2020  1. Left ventricular ejection fraction, by estimation, is 25 to 30%. The  left ventricle has severely decreased function. The left ventricle  demonstrates global hypokinesis. There is mild left ventricular  hypertrophy. Left ventricular diastolic parameters   are consistent with Grade II diastolic dysfunction (pseudonormalization).   2. Right ventricular systolic function is mildly reduced. The right  ventricular size is normal. There is mildly elevated pulmonary artery  systolic pressure. The estimated right ventricular systolic pressure is  09.6 mmHg.   3. Left atrial size was mild to moderately dilated.   4. The mitral valve is normal in structure. Mild mitral valve  regurgitation. No evidence of mitral stenosis.   5. The aortic valve is tricuspid. Aortic valve regurgitation is mild.  Mild aortic valve sclerosis is present, with no evidence of aortic valve  stenosis.   6. The inferior vena cava is dilated in size with >50% respiratory  variability, suggesting right atrial pressure of 8 mmHg.   Patient Profile  Nicole Gibbs is a 75 y.o. female with morbid obesity, OSA, DVT/PE on Coumadin, heart failure with reduced ejection fraction, with recovery in the past, normal coronaries who was admitted on 11/24/2020 for acute on chronic systolic heart failure.  Assessment & Plan   Acute on chronic systolic heart failure, EF 25-30% -History of dilated cardiomyopathy with recovered EF in the past.  Left heart cath normal in the past.  EF has recovered. -Readmitted with heart failure symptoms and reduced ejection fraction. -Troponins are flat.  EKG is nonischemic.  I suspect this is nonischemic etiology. -EKG with PACs and PVCs.  Possibly PVC mediated. -History of hyperthyroidism however TSH is well controlled.  I do not think this is contributing. -Increase metoprolol succinate 100 mg  daily for better PVC suppression.  May need to consider antiarrhythmics as an outpatient. -Tolerating Entresto 24-26 mg twice daily well.  On Aldactone 25 mg daily.  She is also on Jardiance 10 mg daily.  Further titration of guideline directed medical therapy as an outpatient. -Euvolemic.  Continue Lasix 40 mg daily. -We will arrange hospital follow-up in 1 to 2 weeks.  2.  Normal sinus rhythm with PACs and PVCs -No A. Fib -Increase beta-blocker as above for PVC suppression.  [facility] will sign off.   Medication Recommendations: Medications as above Other recommendations (labs, testing, etc): None  Follow up as an outpatient: We will arrange hospital follow-up in 1 to 2 weeks  For questions or updates, please contact [place] Please consult [email or web address] for contact info under   Time Spent with Patient: I have spent a total of 25 minutes with patient reviewing hospital notes, telemetry, EKGs, labs and examining the patient as well as establishing an assessment and plan that was discussed with the patient.  > 50% of time was spent in direct patient care.    Signed, Addison Naegeli. Audie Box, MD, [place]  11/29/2020 8:12 AM

## 2020-11-29 NOTE — Plan of Care (Signed)

## 2020-11-29 NOTE — Assessment & Plan Note (Addendum)
Pt developed a dry hacking cough on 7/16, no fever/chills, sore throat, congestion or other symptoms. Took lisinopril for years with no cough, this admission stopped and has been started on Entresto. Discussed pt's med changes with clinical pharmacist, we agree unlikely this caused by meds. Suspect due to silent reflux vs environmental trigger in hospital. 7/17 - on rounds, pt has incessant hacking cough --Chest xray --Respiratory viral panel --Scheduled Mucinex and PRN Tessalon

## 2020-11-29 NOTE — Progress Notes (Signed)
ANTICOAGULATION CONSULT NOTE - Follow Up Consult  Pharmacy Consult for Warfarin Indication: history of pulmonary embolus and DVT  Allergies  Allergen Reactions   Keflex [Cephalexin] Diarrhea   Sulfa Antibiotics Hives    Patient Measurements: Height: 5\' 1"  (154.9 cm) Weight: 99.6 kg (219 lb 9.6 oz) IBW/kg (Calculated) : 47.8  Vital Signs: Temp: 98.7 F (37.1 C) (07/17 0755) Temp Source: Oral (07/17 0755) BP: 136/125 (07/17 0755) Pulse Rate: 90 (07/17 0755)  Labs: Recent Labs    11/27/20 0412 11/28/20 0616 11/29/20 0035  HGB  --   --  15.2*  HCT  --   --  47.7*  PLT  --   --  303  LABPROT 26.4* 26.6* 27.8*  INR 2.4* 2.5* 2.6*  CREATININE 1.07* 1.07* 1.05*     Estimated Creatinine Clearance: 51.6 mL/min (A) (by C-G formula based on SCr of 1.05 mg/dL (H)).  Assessment: 75 yr old female continues on Warfarin as PTA for hx pulmonary embolism and DVT.   INR 5.5 on admit and warfarin held 7/12 and 7/13.   PTA warfarin regimen: 4.5 mg MWF, 3 mg TTSS - last outpatient INR 3.5 on 10/14/20 on same regimen; prior INRs had been therapeutic on same regimen and dose was not adjusted.  INR today = 2.6. No s/sx of bleeding documented. Oral intake documented at 100%.   Goal of Therapy:  INR 2-3 Monitor platelets by anticoagulation protocol: Yes   Plan:  Warfarin 3 mg today (usual Sunday dose). Daily PT/INR for now.  Pauletta Browns, Pharm.D. PGY-1 Ambulatory Care Resident [phone] 11/29/2020 8:49 AM   Please check AMION for all [place] phone numbers After 10:00 PM, call [place] [phone]

## 2020-11-29 NOTE — Progress Notes (Signed)
Occupational Therapy Treatment Patient Details Name: Nicole Gibbs MRN: [id number] DOB: 11/19/1945 Today's Date: 11/29/2020    History of present illness Pt is 75 yo female admitted with combined CHF and Afib with RVR.  She has hx of HTN, PE and DVTs, CHF, OSA, hypothyroidism, prediabetes, morbid obesity, anxiety and depression.   OT comments  Pt making steady progress towards OT goals this session. Pt reports general malaise, but agreeable to session. Attempted grooming tasks at sink however pt reports only feeling like she can walk to door and back d/t fatigue. Pt currently requires min guard assist for household distance functional mobility with SPC and set- up assist for LB ADLs from EOB, VSS on RA. Pt would continue to benefit from skilled occupational therapy while admitted and after d/c to address the below listed limitations in order to improve overall functional mobility and facilitate independence with BADL participation. DC plan remains appropriate, will follow acutely per POC.     Follow Up Recommendations  Home health OT    Equipment Recommendations  None recommended by OT    Recommendations for Other Services      Precautions / Restrictions Precautions Precautions: Fall Precaution Comments: watch HR (stable this session 7/17) Restrictions Weight Bearing Restrictions: No       Mobility Bed Mobility Overal bed mobility: Modified Independent             General bed mobility comments: use of bed features, +rail    Transfers Overall transfer level: Needs assistance Equipment used: Straight cane Transfers: Sit to/from Stand Sit to Stand: Min guard         General transfer comment: min guard to rise from EOB with SPC    Balance Overall balance assessment: Needs assistance Sitting-balance support: Feet supported;No upper extremity supported Sitting balance-Leahy Scale: Normal     Standing balance support: Single extremity supported Standing balance-Leahy  Scale: Fair Standing balance comment: one UE support for ambulation in room                           ADL either performed or assessed with clinical judgement   ADL Overall ADL's : Needs assistance/impaired                     Lower Body Dressing: Set up;Sitting/lateral leans Lower Body Dressing Details (indicate cue type and reason): to don slide on shoes from EOB Toilet Transfer: Min guard;Ambulation Toilet Transfer Details (indicate cue type and reason): simulated via functional mobility with SPC         Functional mobility during ADLs: Min guard;Cane General ADL Comments: pt limited by general malaise this session and unproductive cough     Vision       Perception     Praxis      Cognition Arousal/Alertness: Awake/alert Behavior During Therapy: WFL for tasks assessed/performed Overall Cognitive Status: Within Functional Limits for tasks assessed                                          Exercises     Shoulder Instructions       General Comments VSS on RA, pt reports concern about being up to "walk up to hill to her house" problem solved through ideas    Pertinent Vitals/ Pain       Pain Assessment: No/denies pain (reports "just  not feeling well")  Home Living                                          Prior Functioning/Environment              Frequency  Min 2X/week        Progress Toward Goals  OT Goals(current goals can now be found in the care plan section)  Progress towards OT goals: Progressing toward goals  Acute Rehab OT Goals Patient Stated Goal: to go home and have home health OT Goal Formulation: With patient Time For Goal Achievement: 12/11/20 Potential to Achieve Goals: [place] Discharge plan remains appropriate;Frequency remains appropriate    Co-evaluation                 AM-PAC OT "6 Clicks" Daily Activity     Outcome Measure   Help from another person  eating meals?: None Help from another person taking care of personal grooming?: A Little Help from another person toileting, which includes using toliet, bedpan, or urinal?: A Little Help from another person bathing (including washing, rinsing, drying)?: A Little Help from another person to put on and taking off regular upper body clothing?: A Little Help from another person to put on and taking off regular lower body clothing?: A Little 6 Click Score: 19    End of Session Equipment Utilized During Treatment: Gait belt;Other (comment) (SPC)  OT Visit Diagnosis: Other abnormalities of gait and mobility (R26.89)   Activity Tolerance Other (comment) (limited by general malaise)   Patient Left in bed;with call bell/phone within reach;with bed alarm set   Nurse Communication Mobility status;Other (comment) (tolerance to session, not feeling well, unproductive cough)        Time: 5697-9480 OT Time Calculation (min): 18 min  Charges: OT General Charges $OT Visit: 1 Visit OT Treatments $Self Care/Home Management : 8-22 mins  Nicole Gibbs., COTA/L Acute Rehabilitation Services [phone]    Nicole Gibbs 11/29/2020, 1:04 PM

## 2020-11-29 NOTE — Progress Notes (Signed)
PROGRESS NOTE    Nicole Gibbs   GBT:[id number]  DOB: [birth year]  PCP: Holland Commons, FNP    DOA: 11/24/2020 LOS: 5   Assessment & Plan   Principal Problem:   Acute on chronic combined systolic and diastolic congestive heart failure, NYHA class 3 (HCC) Active Problems:   Cough   Supratherapeutic INR   Essential hypertension   Dyspnea on exertion   Deep vein thrombosis (DVT) of both lower extremities (HCC)   History of pulmonary embolism   Prediabetes   Lower extremity edema   Insomnia   OSA on CPAP   Class 3 severe obesity with serious comorbidity and body mass index (BMI) of 40.0 to 44.9 in adult [facility])   Hyperthyroidism  Hyperthyroidism TSH within normal this admission. --Continue methimazole.  Dyspnea on exertion Chronic and multifactorial due to CHF, deconditioning, obesity, OAS.  Acutely worse in setting of volume overload w/ decomensated CHF.   Essential hypertension Currently with soft BP's in setting of diuresis and addition of cardiac meds for HFrEF. Was taking lisinopril and Toprol PTA, had been noncompliant with Lasix. Now on Toprol-XL, Aldactone, Entresto and Lasix  Supratherapeutic INR Resolved.  Presented with INR of 5.5.   On warfarin chronically for hx of PE's and bilateral DVT's. --Warfarin resumed, dosing per pharmacy --Daily INR's  Lower extremity edema Chronic, due to hx of bilateral DVT's.  Acutely worse with decompensated CHF. Diuresing as outlined.  Class 3 severe obesity with serious comorbidity and body mass index (BMI) of 40.0 to 44.9 in adult [facility]) Complicates overall care and prognosis.  Recommend lifestyle modifications including physical activity and diet for weight loss and overall long-term health. --Patient attends a wt loss program in community, encouraged to continue this  OSA on CPAP CPAP ordered  Deep vein thrombosis (DVT) of both lower extremities (HCC) Continue warfarin, dose per pharmacy.  Insomnia Continue  sleep aid   History of pulmonary embolism Continue warfarin   Acute on chronic combined systolic and diastolic congestive heart failure, NYHA class 3 (HCC) presented with elevated BNP, with clinical symptoms and findings on xray c/w CHF. Likely due to lack of medicine compliance, had not been taking Lasix recently. Prior Echo Jan 2021 with preserved EF, grade 1 DD. Excellent diuresis with IV Lasix started on admission. ECHO 11/25/20 -- EF 25-30%, global LV hypokinesis, grade 2 diastolic dysfunction Net IO Since Admission: -6,670 mL [11/28/20 1146] --Cardiology consulted, see their recs --Initially diuresed with IV Lasix at 40 mg BID  --Transition to oral Lasix 40 mg daily today  --Strict I/O's & daily weights --Toprol-XL increased to 100 mg daily --Added Aldactone 25 mg daily, Jardiance 10 mg daily --Entresto started 7/16. --TOC consulted for Entresto discount program / out of pocket cost. --O2 PRN, keeps sat >90% --Telemetry  Prediabetes A1c 5.8%.  Continue efforts at weight loss and healthy diet.  Will monitor fasting glucose for now.  Cough Pt developed a dry hacking cough on 7/16, no fever/chills, sore throat, congestion or other symptoms. Took lisinopril for years with no cough, this admission stopped and has been started on Entresto. Discussed pt's med changes with clinical pharmacist, we agree unlikely this caused by meds. Suspect due to silent reflux vs environmental trigger in hospital. 7/17 - on rounds, pt has incessant hacking cough --Chest xray --Respiratory viral panel --Scheduled Mucinex and PRN Tessalon      DVT prophylaxis: on warfarin   Diet:  Diet Orders (From admission, onward)     Start  Ordered   11/24/20 1954  Diet Heart Room service appropriate? Yes; Fluid consistency: Thin  Diet effective now       Question Answer Comment  Room service appropriate? Yes   Fluid consistency: Thin      11/24/20 1954              Code Status: Full Code    Brief Narrative / Hospital Course to Date:   75 y.o. female with medical history significant of HTN, hx of PE and DVTs, systolic and diastolic CHF, OSA, hypothyroidism, prediabetes, anxiety and depression who presented for SOB, wt gain, increased lower extremity swelling and cough.  Sent to ER from her Healthy weight and wellness appointment when noted to be severely short of breath.   Admitted for acute on chronic combined CHF and question of a-fib with RVR, but this is felt to be sinus tach with PAC's and PVC's    Subjective 11/29/20    Pt awake laying flat in the bed.  Continues to have this dry hacking cough that started yesterday.  No fever/chils, sore throat, congestion, or other symptoms.  Denies hx of postnasal drip or reflux.     Disposition Plan & Communication   Status is: Inpatient  Remains inpatient appropriate because: BP's soft and changes in cardiac meds today, warrants close monitoring.  Dispo: The patient is from: Home              Anticipated d/c is to: Home              Patient currently is not medically stable to d/c.   Difficult to place patient No     Consults, Procedures, Significant Events   Consultants:  Cardiology  Procedures:  Echo 11/25/20 - EF 58-52%, grade 2 diastolic dysfunction  Antimicrobials:  Anti-infectives (From admission, onward)    None         Micro    Objective   Vitals:   11/28/20 1642 11/28/20 2015 11/29/20 0408 11/29/20 0755  BP: 96/65 103/76 119/82 (!) 136/125  Pulse: 74 81 81 90  Resp: 20 17 18 16   Temp: 98 F (36.7 C) (!) 97.5 F (36.4 C) (!) 97.5 F (36.4 C) 98.7 F (37.1 C)  TempSrc: Oral Oral Oral Oral  SpO2: 100% 96% 92% 92%  Weight:   99.6 kg   Height:        Intake/Output Summary (Last 24 hours) at 11/29/2020 1552 Last data filed at 11/29/2020 1013 Gross per 24 hour  Intake 480 ml  Output 2250 ml  Net -1770 ml   Filed Weights   11/27/20 0500 11/28/20 0331 11/29/20 0408  Weight: 99.4 kg 99.7  kg 99.6 kg    Physical Exam:  General exam: sleeping wakes easily to voice, no acute distress, obese Respiratory system: frequent dry sounding cough with difficulty completing sentences between cough spells, CTAB normal respiratory effort, on room air, no wheezes rales or rhonchi. Cardiovascular system: normal S1/S2, RRR, BLE edema stable. Central nervous system: A&O x4. no gross focal neurologic deficits, normal speech Psychiatry: normal mood, congruent affect, judgement and insight appear normal  Labs   Data Reviewed: I have personally reviewed following labs and imaging studies  CBC: Recent Labs  Lab 11/24/20 1631 11/29/20 0035  WBC 9.2 11.6*  NEUTROABS 5.9  --   HGB 15.3* 15.2*  HCT 49.0* 47.7*  MCV 92.5 91.0  PLT 284 778   Basic Metabolic Panel: Recent Labs  Lab 11/24/20 1956 11/25/20 0457 11/26/20 0408 11/27/20  1245 11/28/20 0616 11/29/20 0035  NA  --  140 141 139 138 139  K  --  3.6 3.6 3.4* 4.0 4.3  CL  --  107 104 101 103 100  CO2  --  25 29 30 28 28   GLUCOSE  --  106* 118* 102* 102* 99  BUN  --  15 22 24* 26* 25*  CREATININE  --  0.95 1.12* 1.07* 1.07* 1.05*  CALCIUM  --  8.8* 8.4* 8.5* 8.6* 8.8*  MG 1.9  --  1.7 2.3  --  2.4   GFR: Estimated Creatinine Clearance: 51.6 mL/min (A) (by C-G formula based on SCr of 1.05 mg/dL (H)). Liver Function Tests: No results for input(s): AST, ALT, ALKPHOS, BILITOT, PROT, ALBUMIN in the last 168 hours. No results for input(s): LIPASE, AMYLASE in the last 168 hours. No results for input(s): AMMONIA in the last 168 hours. Coagulation Profile: Recent Labs  Lab 11/25/20 0457 11/26/20 0408 11/27/20 0412 11/28/20 0616 11/29/20 0035  INR 4.7* 3.4* 2.4* 2.5* 2.6*   Cardiac Enzymes: No results for input(s): CKTOTAL, CKMB, CKMBINDEX, TROPONINI in the last 168 hours. BNP (last 3 results) No results for input(s): PROBNP in the last 8760 hours. HbA1C: No results for input(s): HGBA1C in the last 72 hours. CBG: No  results for input(s): GLUCAP in the last 168 hours. Lipid Profile: No results for input(s): CHOL, HDL, LDLCALC, TRIG, CHOLHDL, LDLDIRECT in the last 72 hours. Thyroid Function Tests: No results for input(s): TSH, T4TOTAL, FREET4, T3FREE, THYROIDAB in the last 72 hours.  Anemia Panel: No results for input(s): VITAMINB12, FOLATE, FERRITIN, TIBC, IRON, RETICCTPCT in the last 72 hours. Sepsis Labs: No results for input(s): PROCALCITON, LATICACIDVEN in the last 168 hours.  Recent Results (from the past 240 hour(s))  Resp Panel by RT-PCR (Flu A&B, Covid) Nasopharyngeal Swab     Status: None   Collection Time: 11/24/20  6:09 PM   Specimen: Nasopharyngeal Swab; Nasopharyngeal(NP) swabs in vial transport medium  Result Value Ref Range Status   SARS Coronavirus 2 by RT PCR NEGATIVE NEGATIVE Final    Comment: (NOTE) SARS-CoV-2 target nucleic acids are NOT DETECTED.  The SARS-CoV-2 RNA is generally detectable in upper respiratory specimens during the acute phase of infection. The lowest concentration of SARS-CoV-2 viral copies this assay can detect is 138 copies/mL. A negative result does not preclude SARS-Cov-2 infection and should not be used as the sole basis for treatment or other patient management decisions. A negative result may occur with  improper specimen collection/handling, submission of specimen other than nasopharyngeal swab, presence of viral mutation(s) within the areas targeted by this assay, and inadequate number of viral copies(<138 copies/mL). A negative result must be combined with clinical observations, patient history, and epidemiological information. The expected result is Negative.  Fact Sheet for Patients:  EntrepreneurPulse.com.au  Fact Sheet for Healthcare Providers:  IncredibleEmployment.be  This test is no t yet approved or cleared by the [place] FDA and  has been authorized for detection and/or diagnosis of SARS-CoV-2  by FDA under an Emergency Use Authorization (EUA). This EUA will remain  in effect (meaning this test can be used) for the duration of the COVID-19 declaration under Section 564(b)(1) of the Act, 21 U.S.C.section 360bbb-3(b)(1), unless the authorization is terminated  or revoked sooner.       Influenza A by PCR NEGATIVE NEGATIVE Final   Influenza B by PCR NEGATIVE NEGATIVE Final    Comment: (NOTE) The Xpert Xpress SARS-CoV-2/FLU/RSV plus assay is  intended as an aid in the diagnosis of influenza from Nasopharyngeal swab specimens and should not be used as a sole basis for treatment. Nasal washings and aspirates are unacceptable for Xpert Xpress SARS-CoV-2/FLU/RSV testing.  Fact Sheet for Patients: EntrepreneurPulse.com.au  Fact Sheet for Healthcare Providers: IncredibleEmployment.be  This test is not yet approved or cleared by the [place] FDA and has been authorized for detection and/or diagnosis of SARS-CoV-2 by FDA under an Emergency Use Authorization (EUA). This EUA will remain in effect (meaning this test can be used) for the duration of the COVID-19 declaration under Section 564(b)(1) of the Act, 21 U.S.C. section 360bbb-3(b)(1), unless the authorization is terminated or revoked.  Performed at [facility], [place] [address] [place], [place] [address]   Respiratory (~20 pathogens) panel by PCR     Status: None   Collection Time: 11/29/20  9:04 AM   Specimen: Nasopharyngeal Swab; Respiratory  Result Value Ref Range Status   Adenovirus NOT DETECTED NOT DETECTED Final   Coronavirus 229E NOT DETECTED NOT DETECTED Final    Comment: (NOTE) The Coronavirus on the Respiratory Panel, DOES NOT test for the novel  Coronavirus (2019 nCoV)    Coronavirus HKU1 NOT DETECTED NOT DETECTED Final   Coronavirus NL63 NOT DETECTED NOT DETECTED Final   Coronavirus OC43 NOT DETECTED NOT DETECTED Final   Metapneumovirus NOT DETECTED NOT DETECTED  Final   Rhinovirus / Enterovirus NOT DETECTED NOT DETECTED Final   Influenza A NOT DETECTED NOT DETECTED Final   Influenza B NOT DETECTED NOT DETECTED Final   Parainfluenza Virus 1 NOT DETECTED NOT DETECTED Final   Parainfluenza Virus 2 NOT DETECTED NOT DETECTED Final   Parainfluenza Virus 3 NOT DETECTED NOT DETECTED Final   Parainfluenza Virus 4 NOT DETECTED NOT DETECTED Final   Respiratory Syncytial Virus NOT DETECTED NOT DETECTED Final   Bordetella pertussis NOT DETECTED NOT DETECTED Final   Bordetella Parapertussis NOT DETECTED NOT DETECTED Final   Chlamydophila pneumoniae NOT DETECTED NOT DETECTED Final   Mycoplasma pneumoniae NOT DETECTED NOT DETECTED Final    Comment: Performed at [facility], [place]. [address] [place], [place] [address]      Imaging Studies   DG CHEST PORT 1 VIEW  Result Date: 11/29/2020 CLINICAL DATA:  Cough EXAM: PORTABLE CHEST 1 VIEW COMPARISON:  11/24/2020 FINDINGS: Stable cardiomegaly. Atherosclerotic calcification of the aortic knob. Mild pulmonary vascular congestion. No focal airspace consolidation, pleural effusion, or pneumothorax. IMPRESSION: Cardiomegaly with mild pulmonary vascular congestion. Electronically Signed   By: Davina Poke D.O.   On: 11/29/2020 12:11     Medications   Scheduled Meds:  dextromethorphan-guaiFENesin  1 tablet Oral BID   empagliflozin  10 mg Oral Daily   famotidine  20 mg Oral QHS   furosemide  40 mg Oral Daily   methimazole  5 mg Oral Daily   metoprolol succinate  100 mg Oral Daily   progesterone  200 mg Oral QHS   sacubitril-valsartan  1 tablet Oral BID   sodium chloride flush  3 mL Intravenous Q12H   spironolactone  25 mg Oral Daily   Warfarin - Pharmacist Dosing Inpatient   Does not apply q1600   zolpidem  5 mg Oral QHS   Continuous Infusions:  sodium chloride         LOS: 5 days    Time spent: 30 minuets with > 50% spent at bedside and in coordination of care.    Ezekiel Slocumb,  DO [facility]  11/29/2020, 3:52 PM      If 7PM-7AM, please contact night-coverage. How to contact the [facility] Attending or Consulting provider [place] or covering provider during after hours [place], for this patient?    Check the care team in [facility] and look for a) attending/consulting [facility] provider listed and b) the [facility] team listed Log into [email or web address] and use [place]'s universal password to access. If you do not have the password, please contact the hospital operator. Locate the [facility] provider you are looking for under [facility] and page to a number that you can be directly reached. If you still have difficulty reaching the provider, please page the [facility] (Director on Call) for the Hospitalists listed on amion for assistance.

## 2020-11-30 ENCOUNTER — Other Ambulatory Visit (HOSPITAL_COMMUNITY): Payer: Self-pay

## 2020-11-30 LAB — CBC
HCT: 45.8 % (ref 36.0–46.0)
Hemoglobin: 14.4 g/dL (ref 12.0–15.0)
MCH: 28.6 pg (ref 26.0–34.0)
MCHC: 31.4 g/dL (ref 30.0–36.0)
MCV: 91.1 fL (ref 80.0–100.0)
Platelets: 284 10*3/uL (ref 150–400)
RBC: 5.03 MIL/uL (ref 3.87–5.11)
RDW: 14.3 % (ref 11.5–15.5)
WBC: 12.1 10*3/uL — ABNORMAL HIGH (ref 4.0–10.5)
nRBC: 0 % (ref 0.0–0.2)

## 2020-11-30 LAB — PROTIME-INR
INR: 3.2 — ABNORMAL HIGH (ref 0.8–1.2)
Prothrombin Time: 32.4 seconds — ABNORMAL HIGH (ref 11.4–15.2)

## 2020-11-30 MED ORDER — FUROSEMIDE 40 MG PO TABS
40.0000 mg | ORAL_TABLET | Freq: Every day | ORAL | 2 refills | Status: DC
  Filled 2020-11-30: qty 30, 30d supply, fill #0

## 2020-11-30 MED ORDER — FLUCONAZOLE 150 MG PO TABS
150.0000 mg | ORAL_TABLET | Freq: Every day | ORAL | Status: DC
  Administered 2020-11-30: 150 mg via ORAL
  Filled 2020-11-30: qty 1

## 2020-11-30 MED ORDER — NYSTATIN 100000 UNIT/GM EX CREA
1.0000 "application " | TOPICAL_CREAM | Freq: Two times a day (BID) | CUTANEOUS | 0 refills | Status: DC
  Filled 2020-11-30: qty 30, 7d supply, fill #0

## 2020-11-30 MED ORDER — BENZONATATE 200 MG PO CAPS
200.0000 mg | ORAL_CAPSULE | Freq: Three times a day (TID) | ORAL | 0 refills | Status: DC | PRN
  Filled 2020-11-30: qty 20, 7d supply, fill #0

## 2020-11-30 MED ORDER — FLUCONAZOLE 150 MG PO TABS
150.0000 mg | ORAL_TABLET | Freq: Every day | ORAL | 0 refills | Status: AC
  Filled 2020-11-30: qty 2, 2d supply, fill #0

## 2020-11-30 MED ORDER — LOSARTAN POTASSIUM 25 MG PO TABS: 25.0000 mg | ORAL_TABLET | Freq: Every day | ORAL | Status: DC

## 2020-11-30 MED ORDER — METOPROLOL SUCCINATE ER 100 MG PO TB24
100.0000 mg | ORAL_TABLET | Freq: Every day | ORAL | 2 refills | Status: DC
  Filled 2020-11-30: qty 30, 30d supply, fill #0

## 2020-11-30 MED ORDER — LOSARTAN POTASSIUM 25 MG PO TABS
25.0000 mg | ORAL_TABLET | Freq: Every day | ORAL | 2 refills | Status: DC
  Filled 2020-11-30: qty 30, 30d supply, fill #0

## 2020-11-30 MED ORDER — SACUBITRIL-VALSARTAN 24-26 MG PO TABS
1.0000 | ORAL_TABLET | Freq: Two times a day (BID) | ORAL | 2 refills | Status: DC
  Filled 2020-11-30: qty 60, 30d supply, fill #0

## 2020-11-30 MED ORDER — WARFARIN SODIUM 3 MG PO TABS
ORAL_TABLET | ORAL | 1 refills | Status: DC
  Filled 2020-11-30: qty 15, 15d supply, fill #0

## 2020-11-30 MED ORDER — NYSTATIN 100000 UNIT/GM EX CREA
1.0000 "application " | TOPICAL_CREAM | Freq: Two times a day (BID) | CUTANEOUS | Status: DC
  Administered 2020-11-30: 1 via TOPICAL
  Filled 2020-11-30: qty 15

## 2020-11-30 MED ORDER — SPIRONOLACTONE 25 MG PO TABS
25.0000 mg | ORAL_TABLET | Freq: Every day | ORAL | 2 refills | Status: DC
  Filled 2020-11-30: qty 30, 30d supply, fill #0

## 2020-11-30 MED ORDER — WARFARIN SODIUM 2.5 MG PO TABS: 2.5000 mg | ORAL_TABLET | Freq: Once | ORAL | Status: DC

## 2020-11-30 MED ORDER — EMPAGLIFLOZIN 10 MG PO TABS
10.0000 mg | ORAL_TABLET | Freq: Every day | ORAL | 2 refills | Status: DC
  Filled 2020-11-30: qty 14, 14d supply, fill #0

## 2020-11-30 MED ORDER — WARFARIN SODIUM 1 MG PO TABS
1.5000 mg | ORAL_TABLET | Freq: Once | ORAL | Status: AC
  Administered 2020-11-30: 1.5 mg via ORAL
  Filled 2020-11-30: qty 1

## 2020-11-30 NOTE — Care Management Important Message (Signed)
Important Message  Patient Details  Name: Nicole Gibbs MRN: [id number] Date of Birth: 06/13/1945   Medicare Important Message Given:  Yes     Nicole Gibbs 11/30/2020, 9:50 AM

## 2020-11-30 NOTE — Progress Notes (Signed)
Physical Therapy Treatment Patient Details Name: Nicole Gibbs MRN: [id number] DOB: 31-May-1945 Today's Date: 11/30/2020    History of Present Illness Pt is 75 yo female admitted with combined CHF and Afib with RVR.  She has hx of HTN, PE and DVTs, CHF, OSA, hypothyroidism, prediabetes, morbid obesity, anxiety and depression.    PT Comments    Continuing work on functional mobility and activity tolerance;  Session focused on activity tolerance, including getting a set of orthostatic BPs, as pt reported feeling "wonky"; BPs are overall notably soft, with drop at initial sit and initial stand, but noted BP rallies after 3 min standing, or a few minutes walking in the hallway; RN and MD notified    11/30/20 1425  Vital Signs  Patient Position (if appropriate) Orthostatic Vitals  Orthostatic Lying   BP- Lying 100/62  Pulse- Lying 72  Orthostatic Sitting  BP- Sitting (!) 82/45  Pulse- Sitting 113  Orthostatic Standing at 0 minutes  BP- Standing at 0 minutes (!) 87/61  Pulse- Standing at 0 minutes 113  Orthostatic Standing at 3 minutes  BP- Standing at 3 minutes 100/78  Pulse- Standing at 3 minutes 74    Follow Up Recommendations  Home health PT;Supervision - Intermittent     Equipment Recommendations  Other (comment) (Rollator RW)    Recommendations for Other Services       Precautions / Restrictions Precautions Precautions: Fall    Mobility  Bed Mobility                    Transfers Overall transfer level: Needs assistance Equipment used: Straight cane;4-wheeled walker Transfers: Sit to/from Stand Sit to Stand: Supervision Stand pivot transfers: Supervision       General transfer comment: Cues to self monitor for activity tolerance  Ambulation/Gait Ambulation/Gait assistance: Supervision Gait Distance (Feet): 150 Feet Assistive device: 4-wheeled walker Gait Pattern/deviations: Step-through pattern     General Gait Details: Cues to self-monitor for  activity tolerance; Walked with rollator, and tood one break to practice sitting and stnding with it   Marine scientist [place] (Stroke Patients Only)       Balance     Sitting balance-Leahy Scale: Normal       Standing balance-Leahy Scale: Fair                              Cognition Arousal/Alertness: Awake/alert Behavior During Therapy: WFL for tasks assessed/performed Overall Cognitive Status: Within Functional Limits for tasks assessed                                        Exercises      General Comments General comments (skin integrity, edema, etc.): Communicated Orthostatics with RN and MD      Pertinent Vitals/Pain Pain Assessment: No/denies pain    Home Living                      Prior Function            PT Goals (current goals can now be found in the care plan section) Acute Rehab PT Goals Patient Stated Goal: to go home and have home health PT Goal Formulation: With patient Time For Goal Achievement: 12/10/20 Potential to Achieve  Goals: Good Progress towards PT goals: Progressing toward goals    Frequency    Min 3X/week      PT Plan Current plan remains appropriate;Equipment recommendations need to be updated    Co-evaluation              AM-PAC PT "6 Clicks" Mobility   Outcome Measure  Help needed turning from your back to your side while in a flat bed without using bedrails?: None Help needed moving from lying on your back to sitting on the side of a flat bed without using bedrails?: None Help needed moving to and from a bed to a chair (including a wheelchair)?: None Help needed standing up from a chair using your arms (e.g., wheelchair or bedside chair)?: None Help needed to walk in hospital room?: A Little Help needed climbing 3-5 steps with a railing? : A Little 6 Click Score: 22    End of Session Equipment Utilized During Treatment: Gait  belt Activity Tolerance: Patient tolerated treatment well;Other (comment) (wit good self-monitor for activity tolerance) Patient left: in bed;with call bell/phone within reach;Other (comment) (sitting EOB) Nurse Communication: Mobility status PT Visit Diagnosis: Other abnormalities of gait and mobility (R26.89)     Time: 2023-3435 PT Time Calculation (min) (ACUTE ONLY): 43 min  Charges:  $Gait Training: 23-37 mins $Therapeutic Activity: 8-22 mins                     Roney Marion, PT  Acute Rehabilitation Services Pager [phone] Office [place] 11/30/2020, 4:50 PM

## 2020-11-30 NOTE — Progress Notes (Addendum)
ANTICOAGULATION CONSULT NOTE - Follow Up Consult  Pharmacy Consult for Warfarin Indication: Hx pulmonary embolus and DVT (PTA med)  Vital Signs: Temp: 97.7 F (36.5 C) (07/18 0503) Temp Source: Oral (07/18 0503) BP: 109/64 (07/18 0503) Pulse Rate: 88 (07/18 0503)  Labs: Recent Labs    11/28/20 0616 11/29/20 0035 11/30/20 0405  HGB  --  15.2* 14.4  HCT  --  47.7* 45.8  PLT  --  303 284  LABPROT 26.6* 27.8* 32.4*  INR 2.5* 2.6* 3.2*  CREATININE 1.07* 1.05*  --     Estimated Creatinine Clearance: 51.5 mL/min (A) (by C-G formula based on SCr of 1.05 mg/dL (H)).   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 INR on admission 5.5, warfarin held x 2 days.   PTA regimen: MWF 4.5 mg, TTSS 3 mg (total weekly 25.5mg ) Last PTA INR 10/14/20 - 3.5, previously therapeutic on same regimen x 6 weeks  INR = 3.2, above goal No documented s/s bleeding. Doses documented as given.    Starting fluconazole for fungal infection, known interaction with warfarin. Will likely cause increase in INR.   Goal of Therapy:  INR 2-3 Monitor platelets by anticoagulation protocol: Yes   Plan:  Decrease Warfarin 1.5mg  today Daily PT/INR. Watch for s/s of bleeding.  PTA recommendation: Warfarin 3mg  daily (total weekly 21mg ). Close f/u outpatient with fluconazole.  Thank you for allowing pharmacy to participate in this patient's care. Levonne Spiller, PharmD PGY1 Acute  Care Resident  November 30, 2020

## 2020-11-30 NOTE — Progress Notes (Signed)
Patient has severe fungal issues under breast. Can patient get prescription for this before she is discharged?

## 2020-11-30 NOTE — TOC Transition Note (Addendum)
Transition of Care [facility]) - CM/SW Discharge Note   Patient Details  Name: Nicole Gibbs MRN: [id number] Date of Birth: 1945/11/20  [facility] CM/SW Contact:  Zenon Mayo, RN Phone Number: 11/30/2020, 1:00 PM   Clinical Narrative:    NCM spoke with patient at bedside, offered choice for [facility] services. She lives alone, she states she has had [facility] in the past and she like them so she would want them again.  NCM made referral to [name] with [facility] for [place], [place].  Awaiting call back.  Patient states she has all the DME she needs, walker, cane, w/chair and BSC.  She states the ambulance brought her here from the [facility] on [facility] and her car is there so she would need to go get her car at dc.  Benefit check for entresto in process. Per Tommi Rumps he can take this referral.  Soc will begin 24 to 48 hrs post dc.  NCM spoke with patient regarding her 512.00 copay for her entresto refills, she states this is too expensive for her.  TOC is filling the first 30 days free with the coupon for patient.  NCM informed MD that patient states this will be to expensive for her.  MD will check with cards to see if they can change her to lorsartan.     Final next level of care: [place] Barriers to Discharge: No Barriers Identified   Patient Goals and CMS Choice Patient states their goals for this hospitalization and ongoing recovery are:: return home with [facility] CMS Medicare.gov Compare Post Acute Care list provided to:: Patient Choice offered to / list presented to : Patient  Discharge Placement                       Discharge Plan and Services   Discharge Planning Services: CM Consult Post Acute Care Choice: Home Health            DME Agency: NA       HH Arranged: PT, OT HH Agency: [place] Date [place]: 11/30/20 Time [place]: [phone] Representative spoke with at [place]: [place]  ([place]) Interventions     Readmission Risk Interventions No flowsheet data found.

## 2020-11-30 NOTE — Consult Note (Signed)
[facility] CM Inpatient Consult   11/30/2020  Mattilynn Whitcomb Mar 22, 1946 [id number]  [facility] [[facility]]  Accountable Care Organization [ACO] Patient: Medicare CMS DCE  Primary Care Provider:  Fatima Sanger, FNP   Patient is currently in a pending status with [facility] [[facility]] Care Management for chronic disease management services.  Patient will be outreach by a [facility] RN Care Coordinator for post hospital follow up.  Plan: Follow up with [facility] RN Care Coordinator regarding post hospital follow up for needs. Noted new medications.Inpatient Transition Of Care [TOC] team member to make aware that [facility] Care Management following. Of note, [facility] Care Management services does not replace or interfere with any services that are needed or arranged by inpatient [facility] care management team.    For additional questions or referrals please contact:  Charlesetta Shanks, RN BSN CCM [facility]  [phone] business mobile phone Toll free office [phone]  Fax number: [phone] [email or web address]

## 2020-11-30 NOTE — Discharge Summary (Signed)
Physician Discharge Summary  Nicole Gibbs ACZ:[id number] DOB: 01-22-1946 DOA: 11/24/2020  PCP: Holland Commons, FNP  Admit date: 11/24/2020 Discharge date: 12/21/2020  Admitted From: home Disposition:  home  Recommendations for Outpatient Follow-up:  Follow up with PCP in 1-2 weeks Please obtain BMP/CBC in one week Please follow up for INR check on Friday or Monday Follow up with Cardiology as scheduled Please follow up on patient's intertrigo which appears to have candidal infection currently  Home Health: PT, OR  Equipment/Devices: none, patient already has   Discharge Condition: Stable  CODE STATUS: Full    Diet recommendation: Heart Healthy    Discharge Diagnoses: Principal Problem:   Acute on chronic combined systolic and diastolic congestive heart failure, NYHA class 3 (HCC) Active Problems:   Cough   Supratherapeutic INR   Essential hypertension   Dyspnea on exertion   Deep vein thrombosis (DVT) of both lower extremities (HCC)   History of pulmonary embolism   Prediabetes   Lower extremity edema   Insomnia   OSA on CPAP   Class 3 severe obesity with serious comorbidity and body mass index (BMI) of 40.0 to 44.9 in adult [facility])   Hyperthyroidism    Summary of HPI and Hospital Course:   75 y.o. female with medical history significant of HTN, hx of PE and DVTs, systolic and diastolic CHF, OSA, hypothyroidism, prediabetes, anxiety and depression who presented for SOB, wt gain, increased lower extremity swelling and cough.  Sent to ER from her Healthy weight and wellness appointment when noted to be severely short of breath.   Admitted for acute on chronic combined CHF and question of a-fib with RVR, but this is felt to be sinus tach with PAC's and PVC's      Acute on chronic combined systolic and diastolic congestive heart failure, NYHA class 3 ([place]) presented with elevated BNP, with clinical symptoms and findings on xray c/w CHF. Likely due to lack of medicine  compliance, had not been taking Lasix recently. Prior Echo Jan 2021 with preserved EF, grade 1 DD. Excellent diuresis with IV Lasix started on admission. ECHO 11/25/20 -- EF 25-30%, global LV hypokinesis, grade 2 diastolic dysfunction Net IO Since Admission: -8,512 mL [12/21/20 1209]  Started on GDMT for HFrEF including Entresto, but due to this being cost-prohibitive for the patient, Delene Loll was d/c'd and patient on Cozaar for now.    --Cardiology consulted, see their recs --Initially diuresed with IV Lasix at 40 mg BID --Transitioned to oral Lasix 40 mg daily  --Daily weights at home --Toprol-XL increased to 100 mg daily --Added Aldactone 25 mg daily, Jardiance 10 mg daily, Cozaar 25 mg daily --Telemetry  Candidal intertrigo under breasts.  Treated with 3 days Diflucan and Nystatin cream. Close PCP follow up.  Generalized weakness due to chronic comorbidities and obesity most likely.   Home health PT and OT arranged as recommended by our therapists inpatient.    Prediabetes A1c 5.8%.  Continue efforts at weight loss and healthy diet.    Cough Improved with supportive care. Pt developed a dry hacking cough on 7/16, no fever/chills, sore throat, congestion or other symptoms. Took lisinopril for years with no cough, this admission stopped and was started on Entresto >> then switched to Cozaar. Discussed pt's med changes with clinical pharmacist, we agree unlikely this caused by meds. Suspect due to silent reflux vs environmental trigger in hospital. 7/17 - on rounds, pt has incessant hacking cough 7/18 - cough improved --Chest xray negative --Respiratory viral  panel negative --Scheduled Mucinex and PRN Tessalon  Hyperthyroidism TSH within normal this admission. --Continue methimazole.   Dyspnea on exertion Improved, near baseline. Chronic and multifactorial due to CHF, deconditioning, obesity, OAS.  Acutely worse in setting of volume overload w/ decomensated CHF.     Essential hypertension Currently with soft BP's in setting of diuresis and addition of cardiac meds for HFrEF. Was taking lisinopril and Toprol PTA, had been noncompliant with Lasix. Now on Toprol-XL, Aldactone, Cozaar and Lasix   Supratherapeutic INR Resolved.  Presented with INR of 5.5.   On warfarin chronically for hx of PE's and bilateral DVT's. --Warfarin resumed, dosing per pharmacy --Monitor INR Warfarin dose 3 mg daily until follow up, per pharmacy. Pharmacist discussed changing to Eliquis with patient but it is cost prohibitive unfortunately. INR check Friday or Monday given warfarin interaction with diflucan and recently elevated INR (on admission).    Lower extremity edema - Improved somewhat. Chronic, due to hx of bilateral DVT's.  Acutely worse with decompensated CHF.  Diuresed as above.   Class 3 severe obesity with serious comorbidity and body mass index (BMI) of 40.0 to 44.9 in adult [facility]) Complicates overall care and prognosis.  Recommend lifestyle modifications including physical activity and diet for weight loss and overall long-term health. --Patient attends a wt loss program in community, encouraged to continue this   OSA on CPAP CPAP ordered   Deep vein thrombosis (DVT) of both lower extremities  Continue warfarin, dosed per pharmacy.   Insomnia Continue sleep aid    History of pulmonary embolism Continue warfarin   Discharge Instructions   Discharge Instructions     (HEART FAILURE PATIENTS) Call MD:  Anytime you have any of the following symptoms: 1) 3 pound weight gain in 24 hours or 5 pounds in 1 week 2) shortness of breath, with or without a dry hacking cough 3) swelling in the hands, feet or stomach 4) if you have to sleep on extra pillows at night in order to breathe.   Complete by: As directed    AMB referral to CHF clinic   Complete by: As directed    Call MD for:  extreme fatigue   Complete by: As directed    Call MD for:  persistant  dizziness or light-headedness   Complete by: As directed    Call MD for:  severe uncontrolled pain   Complete by: As directed    Call MD for:  temperature >100.4   Complete by: As directed    Diet - low sodium heart healthy   Complete by: As directed    Discharge instructions   Complete by: As directed    You are on multiple new medications for your heart. This combination of medications is proven to improve heart function, and make the heart pump stronger over time. Please take as prescribed and follow up with your cardiology in the next couple of weeks. Call your cardiologist's office if you have questions, concerns or find having side effects from medications.  For Skin under Breasts -  I've prescribed a couple of days of anti-fungal pill (fluconazole / Diflucan) and also Nystatin cream. It is really important to keep the skin of any skin folds dry to prevent fungal infection. I took a fungal culture just to be sure you do not have any systemic fungal infection. I very much doubt this is the case, or you would likely feel very very sick, but if the culture comes back positive, then I will call  you to discuss next steps.   Very important to have your INR checked this week (Friday if possible) because fluconazole along with warfarin can increase your INR.   Increase activity slowly   Complete by: As directed       Allergies as of 11/30/2020       Reactions   Keflex [cephalexin] Diarrhea   Sulfa Antibiotics Hives        Medication List     STOP taking these medications    lisinopril 5 MG tablet Commonly known as: ZESTRIL       TAKE these medications    acetaminophen 325 MG tablet Commonly known as: TYLENOL Take 650 mg by mouth every 6 (six) hours as needed for moderate pain.   benzonatate 200 MG capsule Commonly known as: TESSALON Take 1 capsule (200 mg total) by mouth 3 (three) times daily as needed for cough.   eszopiclone 2 MG Tabs tablet Commonly known as:  LUNESTA Take 1 tablet (2 mg total) by mouth at bedtime as needed. Take immediately before bedtime   furosemide 40 MG tablet Commonly known as: LASIX Take 1 tablet (40 mg total) by mouth daily.   Jardiance 10 MG Tabs tablet Generic drug: empagliflozin Take 1 tablet (10 mg total) by mouth daily.   losartan 25 MG tablet Commonly known as: COZAAR Take 1 tablet (25 mg total) by mouth daily.   methimazole 5 MG tablet Commonly known as: TAPAZOLE Take 5 mg by mouth daily.   metoprolol succinate 100 MG 24 hr tablet Commonly known as: TOPROL-XL Take 1 tablet (100 mg total) by mouth daily. Take with or immediately following a meal. What changed:  medication strength how much to take additional instructions   nitroGLYCERIN 0.4 MG SL tablet Commonly known as: NITROSTAT Place 1 tablet (0.4 mg total) under the tongue every 5 (five) minutes as needed for chest pain.   nystatin cream Commonly known as: MYCOSTATIN Apply 1 application topically 2 (two) times daily.   progesterone 200 MG capsule Commonly known as: PROMETRIUM Take 200 mg by mouth at bedtime.   spironolactone 25 MG tablet Commonly known as: ALDACTONE Take 1 tablet (25 mg total) by mouth daily.   warfarin 3 MG tablet Commonly known as: COUMADIN Take as directed. If you are unsure how to take this medication, talk to your nurse or doctor. Original instructions: Take 1 tablet (3mg ) by mouth daily. What changed: See the new instructions.       ASK your doctor about these medications    fluconazole 150 MG tablet Commonly known as: DIFLUCAN Take 1 tablet (150 mg total) by mouth daily for 2 days. Ask about: Should I take this medication?        Follow-up Information     [name], MD. Go on 12/09/2020.   Specialties: Cardiology, Radiology Why: t@8 :15am Contact information: [address] [place] [address] [phone]         Holland Commons, [place]. Go on 12/01/2020.    Specialty: Internal Medicine Why: @11 :30am Contact information: [address] [place] [address] [phone]         Care, [facility] Follow up.   Specialty: Home Health Services Why: HHPT,HHOT Contact information: [place] [address] [phone]                Allergies  Allergen Reactions   Keflex [Cephalexin] Diarrhea   Sulfa Antibiotics Hives     If  you experience worsening of your admission symptoms, develop shortness of breath, life threatening emergency, suicidal or homicidal thoughts you must seek medical attention immediately by calling 911 or calling your MD immediately  if symptoms less severe.    Please note   You were cared for by a hospitalist during your hospital stay. If you have any questions about your discharge medications or the care you received while you were in the hospital after you are discharged, you can call the unit and asked to speak with the hospitalist on call if the hospitalist that took care of you is not available. Once you are discharged, your primary care physician will handle any further medical issues. Please note that NO REFILLS for any discharge medications will be authorized once you are discharged, as it is imperative that you return to your primary care physician (or establish a relationship with a primary care physician if you do not have one) for your aftercare needs so that they can reassess your need for medications and monitor your lab values.   Consultations: Cardiology / Heart Failure Team    Procedures/Studies: DG CHEST PORT 1 VIEW  Result Date: 11/29/2020 CLINICAL DATA:  Cough EXAM: PORTABLE CHEST 1 VIEW COMPARISON:  11/24/2020 FINDINGS: Stable cardiomegaly. Atherosclerotic calcification of the aortic knob. Mild pulmonary vascular congestion. No focal airspace consolidation, pleural effusion, or pneumothorax. IMPRESSION: Cardiomegaly with mild pulmonary  vascular congestion. Electronically Signed   By: Davina Poke D.O.   On: 11/29/2020 12:11   DG Chest Port 1 View  Result Date: 11/24/2020 CLINICAL DATA:  Short of breath and weakness EXAM: PORTABLE CHEST 1 VIEW COMPARISON:  07/05/2015 FINDINGS: Stable enlarged cardiac silhouette. Central venous pulmonary congestion. Mild basilar atelectasis. No overt pulmonary edema. No pneumothorax. No focal consolidation. No acute osseous abnormality. IMPRESSION: Cardiomegaly and central venous congestion suggest volume overload. Electronically Signed   By: Suzy Bouchard M.D.   On: 11/24/2020 17:26   ECHOCARDIOGRAM COMPLETE  Result Date: 11/25/2020    ECHOCARDIOGRAM REPORT   Patient Name:   Nicole Gibbs Date of Exam: 11/25/2020 Medical Rec #:  [id number]   Height:       61.0 in Accession #:    [id number]  Weight:       229.6 lb Date of Birth:  December 05, 1945    BSA:          2.003 m Patient Age:    75 years    BP:           143/95 mmHg Patient Gender: F           HR:           101 bpm. Exam Location:  Inpatient Procedure: 2D Echo, Cardiac Doppler, Color Doppler and Intracardiac            Opacification Agent Indications:    I50.23 Acute on chronic systolic (congestive) heart failure  History:        Patient has prior history of Echocardiogram examinations, most                 recent 06/10/2019. Risk Factors:Hypertension and Sleep Apnea.                 Pulmonary Embolism. Hypothyroidism.  Sonographer:    Jonelle Sidle Dance Referring Phys: [id number]  1. Left ventricular ejection fraction, by estimation, is 25 to 30%. The left ventricle has severely decreased function. The left ventricle demonstrates global hypokinesis. There is mild left ventricular hypertrophy. Left ventricular diastolic  parameters  are consistent with Grade II diastolic dysfunction (pseudonormalization).  2. Right ventricular systolic function is mildly reduced. The right ventricular size is normal. There is mildly elevated pulmonary  artery systolic pressure. The estimated right ventricular systolic pressure is 56.7 mmHg.  3. Left atrial size was mild to moderately dilated.  4. The mitral valve is normal in structure. Mild mitral valve regurgitation. No evidence of mitral stenosis.  5. The aortic valve is tricuspid. Aortic valve regurgitation is mild. Mild aortic valve sclerosis is present, with no evidence of aortic valve stenosis.  6. The inferior vena cava is dilated in size with >50% respiratory variability, suggesting right atrial pressure of 8 mmHg. FINDINGS  Left Ventricle: Left ventricular ejection fraction, by estimation, is 25 to 30%. The left ventricle has severely decreased function. The left ventricle demonstrates global hypokinesis. Definity contrast agent was given IV to delineate the left ventricular endocardial borders. The left ventricular internal cavity size was normal in size. There is mild left ventricular hypertrophy. Left ventricular diastolic parameters are consistent with Grade II diastolic dysfunction (pseudonormalization). Right Ventricle: The right ventricular size is normal. No increase in right ventricular wall thickness. Right ventricular systolic function is mildly reduced. There is mildly elevated pulmonary artery systolic pressure. The tricuspid regurgitant velocity  is 2.80 m/s, and with an assumed right atrial pressure of 8 mmHg, the estimated right ventricular systolic pressure is 01.4 mmHg. Left Atrium: Left atrial size was mild to moderately dilated. Right Atrium: Right atrial size was normal in size. Pericardium: There is no evidence of pericardial effusion. Mitral Valve: The mitral valve is normal in structure. There is mild thickening of the mitral valve leaflet(s). Mild mitral annular calcification. Mild mitral valve regurgitation. No evidence of mitral valve stenosis. Tricuspid Valve: The tricuspid valve is normal in structure. Tricuspid valve regurgitation is mild. Aortic Valve: The aortic valve is  tricuspid. Aortic valve regurgitation is mild. Aortic regurgitation PHT measures 419 msec. Mild aortic valve sclerosis is present, with no evidence of aortic valve stenosis. Pulmonic Valve: The pulmonic valve was normal in structure. Pulmonic valve regurgitation is not visualized. Aorta: The aortic root is normal in size and structure. Venous: The inferior vena cava is dilated in size with greater than 50% respiratory variability, suggesting right atrial pressure of 8 mmHg. IAS/Shunts: No atrial level shunt detected by color flow Doppler.  LEFT VENTRICLE PLAX 2D LVIDd:         5.40 cm LVIDs:         4.90 cm LV PW:         1.00 cm LV IVS:        1.10 cm LVOT diam:     2.20 cm LV SV:         79 LV SV Index:   39 LVOT Area:     3.80 cm  RIGHT VENTRICLE          IVC RV Basal diam:  2.80 cm  IVC diam: 2.40 cm TAPSE (M-mode): 1.8 cm LEFT ATRIUM             Index       RIGHT ATRIUM           Index LA diam:        4.50 cm 2.25 cm/m  RA Area:     12.00 cm LA Vol (A2C):   92.2 ml 46.03 ml/m RA Volume:   23.40 ml  11.68 ml/m LA Vol (A4C):   65.3 ml 32.60 ml/m LA Biplane  Vol: 78.7 ml 39.29 ml/m  AORTIC VALVE LVOT Vmax:   114.95 cm/s LVOT Vmean:  76.100 cm/s LVOT VTI:    0.207 m AI PHT:      419 msec  AORTA Ao Root diam: 2.90 cm Ao Asc diam:  3.30 cm MITRAL VALVE                TRICUSPID VALVE MV Area (PHT): 3.03 cm     TR Peak grad:   31.4 mmHg MV Decel Time: 250 msec     TR Vmax:        280.00 cm/s MV E velocity: 147.00 cm/s MV A velocity: 104.00 cm/s  SHUNTS MV E/A ratio:  1.41         Systemic VTI:  0.21 m                             Systemic Diam: 2.20 cm Loralie Champagne MD Electronically signed by Loralie Champagne MD Signature Date/Time: 11/25/2020/4:28:42 PM    Final     Echo 11/25/20 - EF 25-36%, grade 2 diastolic dysfunction   Subjective: Pt still feeling anxious about returning home.  Cough is better.  No fever, chills, sore throat or congestion.  She is not able to afford Entresto or Eliquis after this was  looked into by pharmacy. Reports her dyspnea on exertion feels like her baseline now, better.  Just feels generally weak.   Discharge Exam: Vitals:   11/30/20 1324 11/30/20 1341  BP: (!) 76/58 90/64  Pulse: 64   Resp:    Temp:    SpO2: 94%    Vitals:   11/30/20 1213 11/30/20 1238 11/30/20 1324 11/30/20 1341  BP: 91/72 94/69 (!) 76/58 90/64  Pulse:   64   Resp:      Temp:      TempSrc:      SpO2:   94%   Weight:      Height:        General: Pt is alert, awake, not in acute distress, obese Cardiovascular: RRR, S1/S2 +, no rubs, no gallops Respiratory: CTA bilaterally, no wheezing, no rhonchi Abdominal: Soft, NT, ND, bowel sounds + Extremities: BLE edema stable, no cyanosis    The results of significant diagnostics from this hospitalization (including imaging, microbiology, ancillary and laboratory) are listed below for reference.     Microbiology: No results found for this or any previous visit (from the past 240 hour(s)).    Labs: BNP (last 3 results) Recent Labs    11/24/20 1631  BNP 6,440.3*   Basic Metabolic Panel: No results for input(s): NA, K, CL, CO2, GLUCOSE, BUN, CREATININE, CALCIUM, MG, PHOS in the last 168 hours.  Liver Function Tests: No results for input(s): AST, ALT, ALKPHOS, BILITOT, PROT, ALBUMIN in the last 168 hours. No results for input(s): LIPASE, AMYLASE in the last 168 hours. No results for input(s): AMMONIA in the last 168 hours. CBC: No results for input(s): WBC, NEUTROABS, HGB, HCT, MCV, PLT in the last 168 hours.  Cardiac Enzymes: No results for input(s): CKTOTAL, CKMB, CKMBINDEX, TROPONINI in the last 168 hours. BNP: Invalid input(s): POCBNP CBG: No results for input(s): GLUCAP in the last 168 hours. D-Dimer No results for input(s): DDIMER in the last 72 hours. Hgb A1c No results for input(s): HGBA1C in the last 72 hours. Lipid Profile No results for input(s): CHOL, HDL, LDLCALC, TRIG, CHOLHDL, LDLDIRECT in the last 72  hours. Thyroid function studies  No results for input(s): TSH, T4TOTAL, T3FREE, THYROIDAB in the last 72 hours.  Invalid input(s): FREET3 Anemia work up No results for input(s): VITAMINB12, FOLATE, FERRITIN, TIBC, IRON, RETICCTPCT in the last 72 hours. Urinalysis    Component Value Date/Time   COLORURINE YELLOW 01/22/2019 1111   APPEARANCEUR CLEAR 01/22/2019 1111   LABSPEC 1.019 01/22/2019 1111   PHURINE 6.0 01/22/2019 1111   GLUCOSEU NEGATIVE 01/22/2019 1111   HGBUR NEGATIVE 01/22/2019 1111   BILIRUBINUR NEGATIVE 01/22/2019 1111   KETONESUR NEGATIVE 01/22/2019 1111   PROTEINUR NEGATIVE 01/22/2019 1111   UROBILINOGEN 1.0 08/14/2012 0833   NITRITE NEGATIVE 01/22/2019 1111   LEUKOCYTESUR NEGATIVE 01/22/2019 1111   Sepsis Labs Invalid input(s): PROCALCITONIN,  WBC,  LACTICIDVEN Microbiology No results found for this or any previous visit (from the past 240 hour(s)).    Time coordinating discharge: Over 30 minutes  SIGNED:   Ezekiel Slocumb, DO [facility] 12/21/2020, 12:07 PM   If 7PM-7AM, please contact night-coverage [email or web address]

## 2020-11-30 NOTE — TOC Progression Note (Addendum)
Transition of Care [facility]) - Progression Note    Patient Details  Name: Nicole Gibbs MRN: [id number] Date of Birth: 22-Aug-1945  Transition of [facility]) CM/SW Contact  Zenon Mayo, RN Phone Number: 11/30/2020, 9:44 AM  Clinical Narrative:    NCM spoke with patient at bedside, offered choice for [facility] services. She lives alone, she states she has had [facility] in the past and she like them so she would want them again.  NCM made referral to [facility] with [facility] for [place], [place].  Awaiting call back.  Patient states she has all the DME she needs, walker, cane, w/chair and BSC.  She states the ambulance brought her here from the [facility] on [facility] and her car is there so she would need to go get her car at dc.  Benefit check for entresto in process. Per Tommi Rumps he can take this referral.  Soc will begin 24 to 48 hrs post dc.        Expected Discharge Plan and Services                                                 Social Determinants of Health (SDOH) Interventions    Readmission Risk Interventions No flowsheet data found.

## 2020-11-30 NOTE — TOC Progression Note (Signed)
Transition of Care [facility]) - Progression Note    Patient Details  Name: Nicole Gibbs MRN: [id number] Date of Birth: September 06, 1945  Transition of Care [facility]) CM/SW Contact  Zenon Mayo, RN Phone Number: 11/30/2020, 9:57 AM  Clinical Narrative:    NCM spoke with patient at bedside, offered choice for [facility] services. She lives alone, she states she has had [facility] in the past and she like them so she would want them again.  NCM made referral to [facility] with [facility] for [place], [place].  Awaiting call back.  Patient states she has all the DME she needs, walker, cane, w/chair and BSC.  She states the ambulance brought her here from the [facility] on [facility] and her car is there so she would need to go get her car at dc.  Benefit check for entresto in process. Per Tommi Rumps he can take this referral.  Soc will begin 24 to 48 hrs post dc.     Expected Discharge Plan: [place] Barriers to Discharge: Continued Medical Work up  Expected Discharge Plan and Services Expected Discharge Plan: [place]   Discharge Planning Services: CM Consult Post Acute Care Choice: [place] arrangements for the past 2 months: Single Family Home                   DME Agency: NA       HH Arranged: PT, OT HH Agency: [place] Date [facility] Agency Contacted: 11/30/20 Time [place]: [phone] Representative spoke with at [place]: [place] ([place]) Interventions    Readmission Risk Interventions No flowsheet data found.

## 2020-11-30 NOTE — TOC Benefit Eligibility Note (Signed)
Transition of Care [facility]) Benefit Eligibility Note    Patient Details  Name: Nicole Gibbs MRN: [id number] Date of Birth: 05-22-1945   Medication/Dose: Delene Loll 24-26 MG BID  Covered?: Yes  Tier: 3 Drug  Prescription Coverage Preferred Pharmacy: [facility] with Person/Company/Phone Number:: MJ  @ [employer] [facility] # [phone]  Co-Pay: $ 512.96  Prior Approval: No  Deductible: Unmet (OUT-OF-POCKET:UNMET)       Memory Argue Phone Number: 11/30/2020, 11:16 AM

## 2020-11-30 NOTE — Progress Notes (Signed)
Went over discharge instructions with the patient. Educated patient on the medication changes and went over followup appointments. Patient verbalize understanding. PIV and tele monitor has been removed. Transport has been arranged. Patient is dressed ready for transportation.

## 2020-11-30 NOTE — Progress Notes (Signed)
Heart Failure Stewardship Pharmacist Progress Note   PCP: Holland Commons, FNP PCP-Cardiologist: Quay Burow, MD    HPI:  75 yo F with PMH of HTN, PE/DVT, CHF, OSA, hypothyroidism, prediabetes, anxiety and depression. She presented to the ED on 11/24/20 with shortness of breath, weight gain, LE edema, and cough. CXR showed cardiomegaly and volume overload. An ECHO was done on 11/25/20 and LVEF was 25-30% (reduced from 55% from ECHO in January 2021).   Discharge HF Medications: Furosemide 40 mg daily Metoprolol XL 100 mg daily Losartan 25 mg daily Spironolactone 25 mg daily Jardiance 10 mg daily  Prior to admission HF Medications: Furosemide 40 mg daily Metoprolol XL 25 mg daily Lisinopril 5 mg daily  Pertinent Lab Values: Serum creatinine 1.05, BUN 25, Potassium 4.3 Sodium 139, BNP 1930.9, Magnesium 2.4  Vital Signs: Weight: 218 lbs (admission weight: 226 lbs) Blood pressure: 90-100/80s  Heart rate: 70-80s   Medication Assistance / Insurance Benefits Check: Does the patient have prescription insurance?  Yes Type of insurance plan: Medicare + BCBS supplement  Outpatient Pharmacy:  Prior to admission outpatient pharmacy: [facility] Is the patient willing to use [place] pharmacy at discharge? Yes Is the patient willing to transition their outpatient pharmacy to utilize a [facility] outpatient pharmacy?   Pending    Assessment/Plan: 1. Acute on chronic systolic CHF (EF 90-09%), due to NICM. NYHA class II symptoms. - Continue furosemide 40 mg  daily - Continue metoprolol XL 100 mg daily - Continue losartan 25 mg daily. Consider optimizing to [facility] as outpatient if BP allows - Continue spironolactone 25 mg daily - Continue Jardiance 10 mg daily   2. Patient assistance: - $480 deductible remaining on insurance - After deductible is met - Wilder Glade is $19.22 per month, Entresto is $32.96 per month, Jardiance is $23.19 per month   Kerby Nora, PharmD, BCPS Heart  Failure Cytogeneticist Phone [phone]

## 2020-12-02 ENCOUNTER — Ambulatory Visit: Payer: Self-pay | Admitting: *Deleted

## 2020-12-03 ENCOUNTER — Other Ambulatory Visit: Payer: Self-pay | Admitting: *Deleted

## 2020-12-03 DIAGNOSIS — R5381 Other malaise: Secondary | ICD-10-CM | POA: Diagnosis not present

## 2020-12-03 DIAGNOSIS — I428 Other cardiomyopathies: Secondary | ICD-10-CM | POA: Diagnosis not present

## 2020-12-03 DIAGNOSIS — B372 Candidiasis of skin and nail: Secondary | ICD-10-CM | POA: Diagnosis not present

## 2020-12-03 DIAGNOSIS — Z09 Encounter for follow-up examination after completed treatment for conditions other than malignant neoplasm: Secondary | ICD-10-CM | POA: Diagnosis not present

## 2020-12-03 DIAGNOSIS — R600 Localized edema: Secondary | ICD-10-CM | POA: Diagnosis not present

## 2020-12-03 DIAGNOSIS — Z7901 Long term (current) use of anticoagulants: Secondary | ICD-10-CM | POA: Diagnosis not present

## 2020-12-03 DIAGNOSIS — I509 Heart failure, unspecified: Secondary | ICD-10-CM | POA: Diagnosis not present

## 2020-12-03 DIAGNOSIS — R0602 Shortness of breath: Secondary | ICD-10-CM | POA: Diagnosis not present

## 2020-12-03 NOTE — Patient Outreach (Signed)
[place] [facility]) Care Management  12/03/2020  Nicole Gibbs [birth year] [id number]   Telephone Assessment-Unsuccessful  RN attempted outreach call today however unsuccessful. Pt noted to have a ED visit on 7/12 for HF. Will address in detail upon the next successful outreach call.  Will sent outreach letter and attempt another outreach call next week.  Raina Mina, RN Care Management Coordinator [place] Office [phone]

## 2020-12-04 ENCOUNTER — Encounter (HOSPITAL_COMMUNITY): Payer: Medicare Other

## 2020-12-04 ENCOUNTER — Ambulatory Visit (INDEPENDENT_AMBULATORY_CARE_PROVIDER_SITE_OTHER): Payer: Medicare Other

## 2020-12-04 ENCOUNTER — Other Ambulatory Visit: Payer: Self-pay

## 2020-12-04 DIAGNOSIS — I2699 Other pulmonary embolism without acute cor pulmonale: Secondary | ICD-10-CM | POA: Diagnosis not present

## 2020-12-04 DIAGNOSIS — I82403 Acute embolism and thrombosis of unspecified deep veins of lower extremity, bilateral: Secondary | ICD-10-CM

## 2020-12-04 DIAGNOSIS — Z7901 Long term (current) use of anticoagulants: Secondary | ICD-10-CM

## 2020-12-04 LAB — POCT INR: INR: 7.5 — AB (ref 2.0–3.0)

## 2020-12-04 NOTE — Patient Instructions (Signed)
Hold next 4 days and then decrease to 1 tablet daily except 1.5 tablets each Wednesday .  Repeat INR in 1 week.  Eat greens for the next 3 nights.

## 2020-12-07 LAB — FUNGUS CULTURE, BLOOD: Culture: NO GROWTH

## 2020-12-08 ENCOUNTER — Inpatient Hospital Stay (HOSPITAL_COMMUNITY): Admit: 2020-12-08 | Discharge: 2020-12-08 | Disposition: A | Discharge status: Home / Self Care | Payer: Medicare Other

## 2020-12-09 ENCOUNTER — Ambulatory Visit: Payer: Medicare Other | Admitting: Cardiovascular Disease

## 2020-12-09 ENCOUNTER — Other Ambulatory Visit: Payer: Self-pay

## 2020-12-09 ENCOUNTER — Ambulatory Visit (INDEPENDENT_AMBULATORY_CARE_PROVIDER_SITE_OTHER): Payer: Medicare Other

## 2020-12-09 DIAGNOSIS — I2699 Other pulmonary embolism without acute cor pulmonale: Secondary | ICD-10-CM

## 2020-12-09 DIAGNOSIS — Z7901 Long term (current) use of anticoagulants: Secondary | ICD-10-CM | POA: Diagnosis not present

## 2020-12-09 DIAGNOSIS — I82403 Acute embolism and thrombosis of unspecified deep veins of lower extremity, bilateral: Secondary | ICD-10-CM

## 2020-12-09 LAB — POCT INR: INR: 3.3 — AB (ref 2.0–3.0)

## 2020-12-09 NOTE — Patient Instructions (Signed)
Hold tonight only and then continue 1 tablet daily except 1.5 tablets each Wednesday .  Repeat INR in 2 weeks.

## 2020-12-10 ENCOUNTER — Other Ambulatory Visit: Payer: Self-pay | Admitting: *Deleted

## 2020-12-10 NOTE — Patient Outreach (Addendum)
[facility]) Care Management  12/10/2020  Kaleb Dimeglio May 07, 1946 [id number]   Telephone Assessment-Unsuccessful #3  RN attempted outreach call today however unable to leave a HIPAA approved voice message and no response to outreach letter sent earlier in this month.  Will follow up once again next month for ongoing services via [facility].  Raina Mina, RN Care Management Coordinator [place] Office [phone]

## 2020-12-15 DIAGNOSIS — R7302 Impaired glucose tolerance (oral): Secondary | ICD-10-CM | POA: Diagnosis not present

## 2020-12-15 DIAGNOSIS — I10 Essential (primary) hypertension: Secondary | ICD-10-CM | POA: Diagnosis not present

## 2020-12-15 DIAGNOSIS — E0590 Thyrotoxicosis, unspecified without thyrotoxic crisis or storm: Secondary | ICD-10-CM | POA: Diagnosis not present

## 2020-12-15 DIAGNOSIS — D649 Anemia, unspecified: Secondary | ICD-10-CM | POA: Diagnosis not present

## 2020-12-21 DIAGNOSIS — F5101 Primary insomnia: Secondary | ICD-10-CM | POA: Diagnosis not present

## 2020-12-21 DIAGNOSIS — I509 Heart failure, unspecified: Secondary | ICD-10-CM | POA: Diagnosis not present

## 2020-12-22 DIAGNOSIS — I10 Essential (primary) hypertension: Secondary | ICD-10-CM | POA: Diagnosis not present

## 2020-12-22 DIAGNOSIS — I509 Heart failure, unspecified: Secondary | ICD-10-CM | POA: Diagnosis not present

## 2020-12-22 DIAGNOSIS — E0590 Thyrotoxicosis, unspecified without thyrotoxic crisis or storm: Secondary | ICD-10-CM | POA: Diagnosis not present

## 2020-12-22 DIAGNOSIS — E049 Nontoxic goiter, unspecified: Secondary | ICD-10-CM | POA: Diagnosis not present

## 2020-12-24 ENCOUNTER — Other Ambulatory Visit: Payer: Self-pay

## 2020-12-24 ENCOUNTER — Ambulatory Visit (INDEPENDENT_AMBULATORY_CARE_PROVIDER_SITE_OTHER): Payer: Medicare Other | Admitting: Physician Assistant

## 2020-12-24 ENCOUNTER — Ambulatory Visit (INDEPENDENT_AMBULATORY_CARE_PROVIDER_SITE_OTHER): Payer: Medicare Other | Admitting: Pharmacist Clinician (PhC)/ Clinical Pharmacy Specialist

## 2020-12-24 VITALS — BP 118/70 | HR 95 | Ht 61.0 in | Wt 219.2 lb

## 2020-12-24 DIAGNOSIS — I428 Other cardiomyopathies: Secondary | ICD-10-CM

## 2020-12-24 DIAGNOSIS — I82403 Acute embolism and thrombosis of unspecified deep veins of lower extremity, bilateral: Secondary | ICD-10-CM

## 2020-12-24 DIAGNOSIS — Z7901 Long term (current) use of anticoagulants: Secondary | ICD-10-CM | POA: Diagnosis not present

## 2020-12-24 DIAGNOSIS — R0609 Other forms of dyspnea: Secondary | ICD-10-CM

## 2020-12-24 DIAGNOSIS — R0600 Dyspnea, unspecified: Secondary | ICD-10-CM | POA: Diagnosis not present

## 2020-12-24 DIAGNOSIS — Z79899 Other long term (current) drug therapy: Secondary | ICD-10-CM

## 2020-12-24 DIAGNOSIS — I10 Essential (primary) hypertension: Secondary | ICD-10-CM

## 2020-12-24 DIAGNOSIS — I2699 Other pulmonary embolism without acute cor pulmonale: Secondary | ICD-10-CM | POA: Diagnosis not present

## 2020-12-24 LAB — POCT INR: INR: 4.9 — AB (ref 2.0–3.0)

## 2020-12-24 MED ORDER — LOSARTAN POTASSIUM 25 MG PO TABS: 25.0000 mg | ORAL_TABLET | Freq: Every day | ORAL | 2 refills | Status: DC

## 2020-12-24 MED ORDER — EMPAGLIFLOZIN 10 MG PO TABS: 10.0000 mg | ORAL_TABLET | Freq: Every day | ORAL | 2 refills | Status: DC

## 2020-12-24 NOTE — Progress Notes (Signed)
Cardiology Office Note:    Date:  12/26/2020   ID:  Nicole Gibbs, DOB [birth year], MRN [id number]  PCP:  Holland Commons, [place] Providers Cardiologist:  Quay Burow, MD     Referring MD: Holland Commons, FNP   No chief complaint on file.   History of Present Illness:    Nicole Gibbs is a 75 y.o. 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  Patient was previously referred to Dr. Alvester Chou in March 2014 for preoperative evaluation and shortness of breath.  Work-up revealed bilateral DVT and multiple PEs.  She was admitted and placed on Lovenox with Coumadin therapy.  Echocardiogram at the time showed normal EF, mild RV is dysfunction.  Follow-up CTA showed complete resolution PE however did have incidental noted multinodular goiter.  Lower extremity Doppler also revealed resolution of DVT.  Repeat echocardiogram shows EF 35 to 40% with moderate MR in February 2017.  Myoview in March 2017 showed large fixed anteroseptal, apical and inferolateral perfusion defect suggestive of scar and no reversible ischemia.  Cardiac catheterization in April 2017 however revealed normal coronary arteries.  EF improved to 55% by echo in January 2021.  Patient was recently admitted to the hospital in July 2022 for shortness of breath with exertion.  BNP was elevated at 1930.  Chest x-ray showed central venous congestion and cardiomegaly but no overt edema.  Hemoglobin was normal.  Respiratory panel negative for COVID.  Patient was treated with IV Lasix and admitted for acute on chronic CHF.  Although there was some concern of possible atrial fibrillation, EKG was reviewed by cardiology service and felt to be sinus tachycardia with frequent PACs and PVCs.  Repeat echocardiogram obtained on  11/25/2020 showed EF 25 to 30%, global hypokinesis, grade 2 DD, RVSP 39.4 mmHg, mild to moderate LAE, mild MR, mild AI.  Metoprolol succinate was increased to 100 mg daily for better PVC suppression.  TSH well-controlled.  She is on Entresto and spironolactone as well.  She was also placed on Jardiance.  She was ultimately discharged on 40 mg daily of Lasix.  Discharge weight 219 pounds.  Patient presents today for post hospital follow-up today.  She denies any chest pain or worsening shortness of breath.  She has not taking any spironolactone, Jardiance and losartan as she was confused about the medication changes.  I discussed the reasoning behind the dose medication and recommended she restart Jardiance and losartan today.  I will reassess her in 2 to 3 weeks, if blood pressure stable, I will restart spironolactone as well.  I likely will repeat echocardiogram in November to make sure her ejection fraction improves.  Otherwise she is doing well on the higher dose of metoprolol.   Past Medical History:  Diagnosis Date   Anxiety    Arthritis    knees   Chronic combined systolic and diastolic CHF (congestive heart failure) ([place])    a. 07/2012 Echo: EF 55-60%, Gr1 DD;  b. 06/2015 Echo: EF 35-40%, triv AI, Mod MR, mod dil LA, mildly reduced RV.   Depression    DVT, bilateral lower limbs ([place])    H/O cardiovascular stress test    a. 07/2012 MV: fixed mid-dist anterior and apical defect - scar vs attenuation, distal ant HK, no reversibility, EF 51%-->low risk.   Hx of blood clots  Hypertensive heart disease    Hypothyroidism    Joint pain    Nocturia    Normal coronary arteries    by cardiac catheterization 09/03/15   Obese    OSA on CPAP    Postmenopausal vaginal bleeding    Pulmonary embolism, bilateral (HCC)    a. chronic coumadin.   Rash    under breasts   Skin cancer    Sleep apnea    SOB (shortness of breath)    Swelling of both lower extremities    Thyroid nodule    no meds     Past Surgical History:  Procedure Laterality Date   CARDIAC CATHETERIZATION N/A 09/03/2015   Procedure: Left Heart Cath and Coronary Angiography;  Surgeon: [name], MD;  Location: [place] [facility];  Service: Cardiovascular;  Laterality: N/A;   CARDIOVASCULAR STRESS TEST  08-07-2012  DR HILTY/ DR BERRY   LOW RISK NUCLEAR STUDY/ NO ISCHEMIA/ FIXED MID TO DISTAL ANTERIOR AND APICAL DEFECT MAY REPRESENT SCAR VS BREAST ATTENUATION ARTIFACT/  MILD DISTAL ANTERIOR HYPOKINESIS/ EF 51%   CESAREAN SECTION  1992   COLONOSCOPY N/A 05/08/2015   Procedure: COLONOSCOPY;  Surgeon: Laurence Spates, MD;  Location: [place];  Service: Endoscopy;  Laterality: N/A;   DILATION AND CURETTAGE OF UTERUS  06/20/2012   Procedure: DILATATION AND CURETTAGE;  Surgeon: [name], MD;  Location: [place] [facility];  Service: Gynecology;  Laterality: N/A;   dilation and evacution  1988   missed ab   [place] N/A 09/04/2012   Procedure: DILATATION & CURETTAGE WITH THERMACHOICE ABLATION;  Surgeon: [name], MD;  Location: [place];  Service: Gynecology;  Laterality: N/A;   ESOPHAGOGASTRODUODENOSCOPY N/A 05/07/2015   Procedure: ESOPHAGOGASTRODUODENOSCOPY (EGD);  Surgeon: Laurence Spates, MD;  Location: [facility];  Service: Endoscopy;  Laterality: N/A;   HYSTEROSCOPY WITH D & C  03-24-2009   TRANSTHORACIC ECHOCARDIOGRAM  08-08-2012   LVSF NORMAL/ EF [id number]  GRADE I DIASTOLIC DYSFUNCTION/ MILD AV AND MV REGURG./  RVSF MILDLY REDUCED    Current Medications: Current Meds  Medication Sig   zolpidem (AMBIEN) 5 MG tablet 1 tablet at bedtime.     Allergies:   Keflex [cephalexin] and Sulfa antibiotics   Social History   Socioeconomic History   Marital status: Widowed    Spouse name: Not on file   Number of children: Not on file   Years of education: Not on file   Highest education level: Not on file  Occupational History    Occupation: retired  Tobacco Use   Smoking status: Never   Smokeless tobacco: Never  Substance and Sexual Activity   Alcohol use: No   Drug use: No   Sexual activity: Not on file  Other Topics Concern   Not on file  Social History Narrative   Not on file   Social Determinants of Health   Financial Resource Strain: Not on file  Food Insecurity: Not on file  Transportation Needs: Not on file  Physical Activity: Not on file  Stress: Not on file  Social Connections: Not on file     Family History: The patient's family history includes Coronary artery disease (age of onset: 21) in her father; Diabetes in her father; Heart disease in her father; Hypertension in her mother; Mental illness in her mother; Stroke in her father; Sudden death in her sister; Thyroid disease in her mother.  ROS:   Please see the history of present illness.  All other systems reviewed and are negative.  EKGs/Labs/Other Studies Reviewed:    The following studies were reviewed today:  Echo 11/25/2020 1. Left ventricular ejection fraction, by estimation, is 25 to 30%. The  left ventricle has severely decreased function. The left ventricle  demonstrates global hypokinesis. There is mild left ventricular  hypertrophy. Left ventricular diastolic parameters   are consistent with Grade II diastolic dysfunction (pseudonormalization).   2. Right ventricular systolic function is mildly reduced. The right  ventricular size is normal. There is mildly elevated pulmonary artery  systolic pressure. The estimated right ventricular systolic pressure is  [id number] mmHg.   3. Left atrial size was mild to moderately dilated.   4. The mitral valve is normal in structure. Mild mitral valve  regurgitation. No evidence of mitral stenosis.   5. The aortic valve is tricuspid. Aortic valve regurgitation is mild.  Mild aortic valve sclerosis is present, with no evidence of aortic valve  stenosis.   6. The inferior vena cava is  dilated in size with >50% respiratory  variability, suggesting right atrial pressure of 8 mmHg.   EKG:  EKG is ordered today.  The ekg ordered today demonstrates sinus rhythm, heart rate 95 bpm  Recent Labs: 09/28/2020: ALT 14 11/24/2020: B Natriuretic Peptide 1,930.9 11/25/2020: TSH 2.416 11/29/2020: Magnesium 2.4 11/30/2020: Hemoglobin 14.4; Platelets 284 12/24/2020: BUN 14; Creatinine, Ser 0.75; Potassium 4.3; Sodium 144  Recent Lipid Panel    Component Value Date/Time   CHOL 154 12/16/2019 0000   TRIG 90 12/16/2019 0000   HDL 52 12/16/2019 0000   LDLCALC 84 12/16/2019 0000     Risk Assessment/Calculations:           Physical Exam:    VS:  BP 118/70   Pulse 95   Ht '5\' 1"'$  (1.549 m)   Wt 219 lb 3.2 oz (99.4 kg)   BMI 41.42 kg/m     Wt Readings from Last 3 Encounters:  12/24/20 219 lb 3.2 oz (99.4 kg)  11/30/20 218 lb 3.2 oz (99 kg)  11/24/20 229 lb (103.9 kg)     GEN:  Well nourished, well developed in no acute distress HEENT: Normal NECK: No JVD; No carotid bruits LYMPHATICS: No lymphadenopathy CARDIAC: RRR, no murmurs, rubs, gallops RESPIRATORY:  Clear to auscultation without rales, wheezing or rhonchi  ABDOMEN: Soft, non-tender, non-distended MUSCULOSKELETAL:  No edema; No deformity  SKIN: Warm and dry NEUROLOGIC:  Alert and oriented x 3 PSYCHIATRIC:  Normal affect   ASSESSMENT:    1. Dyspnea on exertion   2. Medication management   3. NICM (nonischemic cardiomyopathy) ([place])   4. Essential hypertension    PLAN:    In order of problems listed above:  Dyspnea on exertion: Likely related to the drop in the ejection fraction.  Otherwise, she appears to be euvolemic on exam  Nonischemic cardiomyopathy: Cardiac catheterization revealed normal coronary arteries.  She has not started on majority of the heart failure medication due to confusion about their function.  I sat down with the patient and explained the function of each of her heart failure medication.   I am hesitant to start all medication back at once.  I instructed her to start taking the losartan and the Jardiance.  I plan to bring the patient back in a few weeks, if blood pressure still stable, will start on spironolactone.  She has been taking the metoprolol succinate.  Once she is compliant with heart failure medication for 50-month plan to repeat echocardiogram  Hypertension: Blood pressure normal, however she has not been taking the losartan and spironolactone.  We will restart losartan today along with Jardiance.  I plan to restart spironolactone on the next visit if her blood pressure stable.         Medication Adjustments/Labs and Tests Ordered: Current medicines are reviewed at length with the patient today.  Concerns regarding medicines are outlined above.  Orders Placed This Encounter  Procedures   Basic Metabolic Panel (BMET)   EKG 12-Lead   Meds ordered this encounter  Medications   empagliflozin (JARDIANCE) 10 MG TABS tablet    Sig: Take 1 tablet (10 mg total) by mouth daily.    Dispense:  30 tablet    Refill:  2   losartan (COZAAR) 25 MG tablet    Sig: Take 1 tablet (25 mg total) by mouth daily.    Dispense:  90 tablet    Refill:  2    Patient Instructions  Medication Instructions:  RESTART- Jardiance 10 mg by mouth daily RESTART- Losartan 25 mg by mouth daily  *If you need a refill on your cardiac medications before your next appointment, please call your pharmacy*   Lab Work: BMP Today  If you have labs (blood work) drawn today and your tests are completely normal, you will receive your results only by: [place] (if you have MyChart) OR A paper copy in the mail If you have any lab test that is abnormal or we need to change your treatment, we will call you to review the results.   Testing/Procedures: None Ordered   Follow-Up: At [facility], you and your health needs are our priority.  As part of our continuing mission to provide you  with exceptional heart care, we have created designated Provider Care Teams.  These Care Teams include your primary Cardiologist (physician) and Advanced Practice Providers (APPs -  Physician Assistants and Nurse Practitioners) who all work together to provide you with the care you need, when you need it.  We recommend signing up for the patient portal called "MyChart".  Sign up information is provided on this After Visit Summary.  MyChart is used to connect with patients for Virtual Visits (Telemedicine).  Patients are able to view lab/test results, encounter notes, upcoming appointments, etc.  Non-urgent messages can be sent to your provider as well.   To learn more about what you can do with MyChart, go to [email or web address].    Your next appointment:   2-3  week(s)  The format for your next appointment:   In Person  Provider:   You will see one of the following Advanced Practice Providers on your designated Care Team:   Almyra Deforest, PA-C         SignedAlmyra Deforest, [place]  12/26/2020 11:26 PM    [place]

## 2020-12-24 NOTE — Patient Instructions (Signed)
Medication Instructions:  RESTART- Jardiance 10 mg by mouth daily RESTART- Losartan 25 mg by mouth daily  *If you need a refill on your cardiac medications before your next appointment, please call your pharmacy*   Lab Work: BMP Today  If you have labs (blood work) drawn today and your tests are completely normal, you will receive your results only by: [place] (if you have MyChart) OR A paper copy in the mail If you have any lab test that is abnormal or we need to change your treatment, we will call you to review the results.   Testing/Procedures: None Ordered   Follow-Up: At [facility], you and your health needs are our priority.  As part of our continuing mission to provide you with exceptional heart care, we have created designated Provider Care Teams.  These Care Teams include your primary Cardiologist (physician) and Advanced Practice Providers (APPs -  Physician Assistants and Nurse Practitioners) who all work together to provide you with the care you need, when you need it.  We recommend signing up for the patient portal called "MyChart".  Sign up information is provided on this After Visit Summary.  MyChart is used to connect with patients for Virtual Visits (Telemedicine).  Patients are able to view lab/test results, encounter notes, upcoming appointments, etc.  Non-urgent messages can be sent to your provider as well.   To learn more about what you can do with MyChart, go to [email or web address].    Your next appointment:   2-3  week(s)  The format for your next appointment:   In Person  Provider:   You will see one of the following Advanced Practice Providers on your designated Care Team:   Almyra Deforest, [place]

## 2020-12-25 LAB — BASIC METABOLIC PANEL
BUN/Creatinine Ratio: 19 (ref 12–28)
BUN: 14 mg/dL (ref 8–27)
CO2: 25 mmol/L (ref 20–29)
Calcium: 9.2 mg/dL (ref 8.7–10.3)
Chloride: 103 mmol/L (ref 96–106)
Creatinine, Ser: 0.75 mg/dL (ref 0.57–1.00)
Glucose: 80 mg/dL (ref 65–99)
Potassium: 4.3 mmol/L (ref 3.5–5.2)
Sodium: 144 mmol/L (ref 134–144)
eGFR: 84 mL/min/{1.73_m2} (ref >59)

## 2020-12-25 NOTE — Progress Notes (Signed)
Stable renal function and electrolyte

## 2020-12-26 ENCOUNTER — Encounter: Payer: Self-pay | Admitting: Physician Assistant

## 2021-01-04 ENCOUNTER — Ambulatory Visit (INDEPENDENT_AMBULATORY_CARE_PROVIDER_SITE_OTHER): Payer: Medicare Other

## 2021-01-04 ENCOUNTER — Other Ambulatory Visit: Payer: Self-pay

## 2021-01-04 ENCOUNTER — Other Ambulatory Visit: Payer: Self-pay | Admitting: Student

## 2021-01-04 DIAGNOSIS — Z7901 Long term (current) use of anticoagulants: Secondary | ICD-10-CM

## 2021-01-04 DIAGNOSIS — I2699 Other pulmonary embolism without acute cor pulmonale: Secondary | ICD-10-CM

## 2021-01-04 DIAGNOSIS — I82403 Acute embolism and thrombosis of unspecified deep veins of lower extremity, bilateral: Secondary | ICD-10-CM | POA: Diagnosis not present

## 2021-01-04 DIAGNOSIS — Z5181 Encounter for therapeutic drug level monitoring: Secondary | ICD-10-CM

## 2021-01-04 LAB — POCT INR: INR: 3.2 — AB (ref 2.0–3.0)

## 2021-01-04 NOTE — Patient Instructions (Signed)
Description   Take 1/2 tablet today and then continue taking 1 tablet daily.  Repeat INR in 2 weeks

## 2021-01-06 ENCOUNTER — Other Ambulatory Visit: Payer: Self-pay | Admitting: *Deleted

## 2021-01-06 NOTE — Patient Outreach (Signed)
[facility]) Care Management  01/06/2021  Markitta Plager [birth year] [id number]   Case Closure  RN attempted 4th outreach call however unsuccessful and unable to leave a message. RN also attempted outreach call to the provider unsuccessful.   Will send outreach letter to the provided for case closure.   Raina Mina, RN Care Management Coordinator [place] Office [phone]

## 2021-01-08 ENCOUNTER — Other Ambulatory Visit (HOSPITAL_COMMUNITY): Payer: Self-pay

## 2021-01-18 DIAGNOSIS — Z20822 Contact with and (suspected) exposure to covid-19: Secondary | ICD-10-CM | POA: Diagnosis not present

## 2021-01-19 ENCOUNTER — Other Ambulatory Visit (HOSPITAL_COMMUNITY): Payer: Self-pay

## 2021-01-20 ENCOUNTER — Other Ambulatory Visit: Payer: Self-pay

## 2021-01-20 ENCOUNTER — Ambulatory Visit (INDEPENDENT_AMBULATORY_CARE_PROVIDER_SITE_OTHER): Payer: Medicare Other | Admitting: Physician Assistant

## 2021-01-20 ENCOUNTER — Encounter: Payer: Self-pay | Admitting: Physician Assistant

## 2021-01-20 ENCOUNTER — Ambulatory Visit (INDEPENDENT_AMBULATORY_CARE_PROVIDER_SITE_OTHER): Payer: Medicare Other

## 2021-01-20 VITALS — BP 118/82 | HR 76 | Ht 61.0 in | Wt 229.2 lb

## 2021-01-20 DIAGNOSIS — I82403 Acute embolism and thrombosis of unspecified deep veins of lower extremity, bilateral: Secondary | ICD-10-CM

## 2021-01-20 DIAGNOSIS — I428 Other cardiomyopathies: Secondary | ICD-10-CM | POA: Diagnosis not present

## 2021-01-20 DIAGNOSIS — I824Y3 Acute embolism and thrombosis of unspecified deep veins of proximal lower extremity, bilateral: Secondary | ICD-10-CM

## 2021-01-20 DIAGNOSIS — Z79899 Other long term (current) drug therapy: Secondary | ICD-10-CM | POA: Diagnosis not present

## 2021-01-20 DIAGNOSIS — I10 Essential (primary) hypertension: Secondary | ICD-10-CM

## 2021-01-20 DIAGNOSIS — I5042 Chronic combined systolic (congestive) and diastolic (congestive) heart failure: Secondary | ICD-10-CM | POA: Diagnosis not present

## 2021-01-20 DIAGNOSIS — Z7901 Long term (current) use of anticoagulants: Secondary | ICD-10-CM

## 2021-01-20 LAB — POCT INR: INR: 4.1 — AB (ref 2.0–3.0)

## 2021-01-20 NOTE — Patient Instructions (Signed)
Hold tonight only and then decrease to 1 tablet daily, except 0.5 tablet on Monday, Wednesday and Friday.  Repeat INR in 1 week

## 2021-01-20 NOTE — Progress Notes (Signed)
Cardiology Office Note:    Date:  01/22/2021   ID:  [name], DOB 25-Dec-1945, MRN [id number]  PCP:  Holland Commons, [facility] Cardiologist:  Quay Burow, MD     Referring MD: Holland Commons, FNP   Chief Complaint  Patient presents with   Follow-up    Seen for Dr. [name]    History of Present Illness:    Nicole Gibbs is a 75 y.o. 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  Patient was previously referred to Dr. [name] in March 2014 for preoperative evaluation and shortness of breath.  Work-up revealed bilateral DVT and multiple PEs.  She was admitted and placed on Lovenox with Coumadin therapy.  Echocardiogram at the time showed normal EF, mild RV is dysfunction.  Follow-up CTA showed complete resolution of PE however did have incidental noted multinodular goiter.  Lower extremity Doppler also revealed resolution of DVT.  Repeat echocardiogram shows EF 35 to 40% with moderate MR in February 2017.  Myoview in March 2017 showed large fixed anteroseptal, apical and inferolateral perfusion defect suggestive of scar and no reversible ischemia.  Cardiac catheterization in April 2017 however revealed normal coronary arteries.  EF improved to 55% by echo in January 2021.  Patient was recently admitted to the hospital in July 2022 for shortness of breath with exertion.  BNP was elevated at 1930.  Chest x-ray showed central venous congestion and cardiomegaly but no overt edema.  Hemoglobin was normal.  Respiratory panel negative for COVID.  Patient was treated with IV Lasix and admitted for acute on chronic CHF.  Although there was some concern of possible atrial fibrillation, EKG was reviewed by cardiology service and felt to be sinus tachycardia with frequent PACs  and PVCs.  Repeat echocardiogram obtained on 11/25/2020 showed EF 25 to 30%, global hypokinesis, grade 2 DD, RVSP 39.4 mmHg, mild to moderate LAE, mild MR, mild AI.  Metoprolol succinate was increased to 100 mg daily for better PVC suppression.  TSH well-controlled.  She is on Entresto and spironolactone as well.  She was also placed on Jardiance.  She was ultimately discharged on 40 mg daily of Lasix.  Discharge weight 219 pounds.  I last saw the patient on 12/25/2018, she was confused about her medication.  She was not taking the Jardiance, losartan or spironolactone.  I restarted the Jardiance and losartan, I explained to her that those medication is to strengthen the heart over time.  She returns today for follow-up.  I was going to assess her blood pressure and if stable and to consider restart spironolactone.  Unfortunately she became confused again and has not even started on the Jardiance and the losartan since the last visit.  I added full-sized on the importance of initiating Jardiance and the losartan.  I will obtain a basic metabolic panel in 2 weeks.  I plan to bring her back in 3 to 4 weeks to consider restart spironolactone again.  Past Medical History:  Diagnosis Date   Anxiety    Arthritis    knees   Chronic combined systolic and diastolic CHF (congestive heart failure) ([place])    a. 07/2012 Echo: EF 55-60%, Gr1 DD;  b. 06/2015 Echo: EF 35-40%, triv AI, Mod MR, mod dil LA, mildly reduced RV.   Depression    DVT, bilateral lower  limbs ([place])    H/O cardiovascular stress test    a. 07/2012 MV: fixed mid-dist anterior and apical defect - scar vs attenuation, distal ant HK, no reversibility, EF 51%-->low risk.   Hx of blood clots    Hypertensive heart disease    Hypothyroidism    Joint pain    Nocturia    Normal coronary arteries    by cardiac catheterization 09/03/15   Obese    OSA on CPAP    Postmenopausal vaginal bleeding    Pulmonary embolism, bilateral (HCC)    a. chronic coumadin.    Rash    under breasts   Skin cancer    Sleep apnea    SOB (shortness of breath)    Swelling of both lower extremities    Thyroid nodule    no meds    Past Surgical History:  Procedure Laterality Date   CARDIAC CATHETERIZATION N/A 09/03/2015   Procedure: Left Heart Cath and Coronary Angiography;  Surgeon: [name], MD;  Location: [facility];  Service: Cardiovascular;  Laterality: N/A;   CARDIOVASCULAR STRESS TEST  08-07-2012  DR HILTY/ DR BERRY   LOW RISK NUCLEAR STUDY/ NO ISCHEMIA/ FIXED MID TO DISTAL ANTERIOR AND APICAL DEFECT MAY REPRESENT SCAR VS BREAST ATTENUATION ARTIFACT/  MILD DISTAL ANTERIOR HYPOKINESIS/ EF 51%   CESAREAN SECTION  1992   COLONOSCOPY N/A 05/08/2015   Procedure: COLONOSCOPY;  Surgeon: Laurence Spates, MD;  Location: [facility];  Service: Endoscopy;  Laterality: N/A;   DILATION AND CURETTAGE OF UTERUS  06/20/2012   Procedure: DILATATION AND CURETTAGE;  Surgeon: [name], MD;  Location: [facility];  Service: Gynecology;  Laterality: N/A;   dilation and evacution  1988   missed ab   [place] N/A 09/04/2012   Procedure: DILATATION & CURETTAGE WITH THERMACHOICE ABLATION;  Surgeon: [name], MD;  Location: [facility];  Service: Gynecology;  Laterality: N/A;   ESOPHAGOGASTRODUODENOSCOPY N/A 05/07/2015   Procedure: ESOPHAGOGASTRODUODENOSCOPY (EGD);  Surgeon: Laurence Spates, MD;  Location: [facility];  Service: Endoscopy;  Laterality: N/A;   HYSTEROSCOPY WITH D & C  03-24-2009   TRANSTHORACIC ECHOCARDIOGRAM  08-08-2012   LVSF NORMAL/ EF [id number]  GRADE I DIASTOLIC DYSFUNCTION/ MILD AV AND MV REGURG./  RVSF MILDLY REDUCED    Current Medications: Current Meds  Medication Sig   acetaminophen (TYLENOL) 325 MG tablet Take 650 mg by mouth every 6 (six) hours as needed for moderate pain.   benzonatate (TESSALON) 200 MG capsule Take 1 capsule (200 mg total) by mouth 3 (three)  times daily as needed for cough.   furosemide (LASIX) 40 MG tablet Take 1 tablet (40 mg total) by mouth daily. (Patient taking differently: Take 40 mg by mouth daily. Take 1 tablet 3 times a week)   MELATONIN PO Take by mouth at bedtime as needed.   methimazole (TAPAZOLE) 5 MG tablet Take 5 mg by mouth daily.   metoprolol succinate (TOPROL-XL) 100 MG 24 hr tablet Take 1 tablet (100 mg total) by mouth daily. Take with or immediately following a meal.   nitroGLYCERIN (NITROSTAT) 0.4 MG SL tablet Place 1 tablet (0.4 mg total) under the tongue every 5 (five) minutes as needed for chest pain.   nystatin cream (MYCOSTATIN) Apply 1 application topically 2 (two) times daily.   progesterone (PROMETRIUM) 200 MG capsule Take 200 mg by mouth at bedtime.    warfarin (COUMADIN) 3 MG tablet TAKE 1 TO 1 & 1/2 (  ONE & ONE-HALF) TABLETS BY MOUTH ONCE DAILY AS DIRECTED   zolpidem (AMBIEN) 5 MG tablet 1 tablet at bedtime.     Allergies:   Keflex [cephalexin] and Sulfa antibiotics   Social History   Socioeconomic History   Marital status: Widowed    Spouse name: Not on file   Number of children: Not on file   Years of education: Not on file   Highest education level: Not on file  Occupational History   Occupation: retired  Tobacco Use   Smoking status: Never   Smokeless tobacco: Never  Substance and Sexual Activity   Alcohol use: No   Drug use: No   Sexual activity: Not on file  Other Topics Concern   Not on file  Social History Narrative   Not on file   Social Determinants of Health   Financial Resource Strain: Not on file  Food Insecurity: Not on file  Transportation Needs: Not on file  Physical Activity: Not on file  Stress: Not on file  Social Connections: Not on file     Family History: The patient's family history includes Coronary artery disease (age of onset: 61) in her father; Diabetes in her father; Heart disease in her father; Hypertension in her mother; Mental illness in her  mother; Stroke in her father; Sudden death in her sister; Thyroid disease in her mother.  ROS:   Please see the history of present illness.     All other systems reviewed and are negative.  EKGs/Labs/Other Studies Reviewed:    The following studies were reviewed today:  Echo 11/25/2020  1. Left ventricular ejection fraction, by estimation, is 25 to 30%. The  left ventricle has severely decreased function. The left ventricle  demonstrates global hypokinesis. There is mild left ventricular  hypertrophy. Left ventricular diastolic parameters   are consistent with Grade II diastolic dysfunction (pseudonormalization).   2. Right ventricular systolic function is mildly reduced. The right  ventricular size is normal. There is mildly elevated pulmonary artery  systolic pressure. The estimated right ventricular systolic pressure is  A999333 mmHg.   3. Left atrial size was mild to moderately dilated.   4. The mitral valve is normal in structure. Mild mitral valve  regurgitation. No evidence of mitral stenosis.   5. The aortic valve is tricuspid. Aortic valve regurgitation is mild.  Mild aortic valve sclerosis is present, with no evidence of aortic valve  stenosis.   6. The inferior vena cava is dilated in size with >50% respiratory  variability, suggesting right atrial pressure of 8 mmHg.  EKG:  EKG is not ordered today.    Recent Labs: 09/28/2020: ALT 14 11/24/2020: B Natriuretic Peptide 1,930.9 11/25/2020: TSH 2.416 11/29/2020: Magnesium 2.4 11/30/2020: Hemoglobin 14.4; Platelets 284 12/24/2020: BUN 14; Creatinine, Ser 0.75; Potassium 4.3; Sodium 144  Recent Lipid Panel    Component Value Date/Time   CHOL 154 12/16/2019 0000   TRIG 90 12/16/2019 0000   HDL 52 12/16/2019 0000   LDLCALC 84 12/16/2019 0000     Risk Assessment/Calculations:           Physical Exam:    VS:  BP 118/82   Pulse 76   Ht '5\' 1"'$  (1.549 m)   Wt 229 lb 3.2 oz (104 kg)   SpO2 96%   BMI 43.31 kg/m     Wt  Readings from Last 3 Encounters:  01/20/21 229 lb 3.2 oz (104 kg)  12/24/20 219 lb 3.2 oz (99.4 kg)  11/30/20 218 lb  3.2 oz (99 kg)     GEN:  Well nourished, well developed in no acute distress HEENT: Normal NECK: No JVD; No carotid bruits LYMPHATICS: No lymphadenopathy CARDIAC: RRR, no murmurs, rubs, gallops RESPIRATORY:  Clear to auscultation without rales, wheezing or rhonchi  ABDOMEN: Soft, non-tender, non-distended MUSCULOSKELETAL:  No edema; No deformity  SKIN: Warm and dry NEUROLOGIC:  Alert and oriented x 3 PSYCHIATRIC:  Normal affect   ASSESSMENT:    1. Chronic combined systolic and diastolic CHF (congestive heart failure) ([place])   2. Medication management   3. NICM (nonischemic cardiomyopathy) ([place])   4. Essential hypertension   5. Deep vein thrombosis (DVT) of proximal vein of both lower extremities, unspecified chronicity (HCC)    PLAN:    In order of problems listed above:  Chronic combined systolic and diastolic heart failure: She has not been taking her Lasix on a daily basis, I instructed her to continue Lasix at 40 mg daily instead.  She has gained roughly 10 pounds since the last visit due to noncompliance with medication.  Nonischemic cardiomyopathy: She was previously discharged on losartan, spironolactone, metoprolol succinate and Jardiance.  Unfortunately due to confusion with her medication, she never took the losartan, spironolactone or the Jardiance.  She is still taking the metoprolol succinate.  During the last office visit, I asked her to restart losartan and the Jardiance which she has not done so.  I asked her again today to start both medication.  We will hold off on restarting spironolactone until the next visit.  Hypertension: We will restart losartan today.  Continue metoprolol succinate.  If blood pressure stable, I will plan to restart spironolactone during the next office visit  DVT: On chronic Coumadin therapy.        Medication  Adjustments/Labs and Tests Ordered: Current medicines are reviewed at length with the patient today.  Concerns regarding medicines are outlined above.  Orders Placed This Encounter  Procedures   Basic metabolic panel   No orders of the defined types were placed in this encounter.   Patient Instructions  Medication Instructions:  START Jardiance '10mg'$  daily START Losartan (Cozaar) 25 mg daily TAKE Lasix (Furosemide) 40 mg daily  CONTINUE to hold Spironolactone until next appointment with Nicole Deforest, PA-C  *If you need a refill on your cardiac medications before your next appointment, please call your pharmacy*  Lab Work: Your physician recommends that you return for lab work in 2 WEEKS:  BMET   If you have labs (blood work) drawn today and your tests are completely normal, you will receive your results only by: [employer] (if you have MyChart) OR A paper copy in the mail If you have any lab test that is abnormal or we need to change your treatment, we will call you to review the results.  Testing/Procedures: NONE ordered at this time of appointment   Follow-Up: At [facility], you and your health needs are our priority.  As part of our continuing mission to provide you with exceptional heart care, we have created designated Provider Care Teams.  These Care Teams include your primary Cardiologist (physician) and Advanced Practice Providers (APPs -  Physician Assistants and Nurse Practitioners) who all work together to provide you with the care you need, when you need it.  We recommend signing up for the patient portal called "MyChart".  Sign up information is provided on this After Visit Summary.  MyChart is used to connect with patients for Virtual Visits (Telemedicine).  Patients  are able to view lab/test results, encounter notes, upcoming appointments, etc.  Non-urgent messages can be sent to your provider as well.   To learn more about what you can do with MyChart, go to  [email or web address].    Your next appointment:   3-4 week(s)  The format for your next appointment:   In Person  Provider:   Almyra Deforest, PA-C  Other Instructions    Signed, Nicole Gibbs, [place]  01/22/2021 11:43 PM    [place]

## 2021-01-20 NOTE — Patient Instructions (Signed)
Medication Instructions:  START Jardiance '10mg'$  daily START Losartan (Cozaar) 25 mg daily TAKE Lasix (Furosemide) 40 mg daily  CONTINUE to hold Spironolactone until next appointment with Almyra Deforest, PA-C  *If you need a refill on your cardiac medications before your next appointment, please call your pharmacy*  Lab Work: Your physician recommends that you return for lab work in 2 WEEKS:  BMET   If you have labs (blood work) drawn today and your tests are completely normal, you will receive your results only by: [employer] (if you have MyChart) OR A paper copy in the mail If you have any lab test that is abnormal or we need to change your treatment, we will call you to review the results.  Testing/Procedures: NONE ordered at this time of appointment   Follow-Up: At [facility], you and your health needs are our priority.  As part of our continuing mission to provide you with exceptional heart care, we have created designated Provider Care Teams.  These Care Teams include your primary Cardiologist (physician) and Advanced Practice Providers (APPs -  Physician Assistants and Nurse Practitioners) who all work together to provide you with the care you need, when you need it.  We recommend signing up for the patient portal called "MyChart".  Sign up information is provided on this After Visit Summary.  MyChart is used to connect with patients for Virtual Visits (Telemedicine).  Patients are able to view lab/test results, encounter notes, upcoming appointments, etc.  Non-urgent messages can be sent to your provider as well.   To learn more about what you can do with MyChart, go to [email or web address].    Your next appointment:   3-4 week(s)  The format for your next appointment:   In Person  Provider:   Almyra Deforest, PA-C  Other Instructions

## 2021-01-22 ENCOUNTER — Telehealth: Payer: Self-pay

## 2021-01-22 ENCOUNTER — Encounter: Payer: Self-pay | Admitting: Physician Assistant

## 2021-01-22 NOTE — Telephone Encounter (Signed)
LMOM TO R/S COUMADIN TO A DIFFERENT DAY

## 2021-01-27 ENCOUNTER — Other Ambulatory Visit: Payer: Self-pay

## 2021-01-27 ENCOUNTER — Ambulatory Visit (INDEPENDENT_AMBULATORY_CARE_PROVIDER_SITE_OTHER): Payer: Medicare Other

## 2021-01-27 DIAGNOSIS — I82403 Acute embolism and thrombosis of unspecified deep veins of lower extremity, bilateral: Secondary | ICD-10-CM | POA: Diagnosis not present

## 2021-01-27 DIAGNOSIS — Z7901 Long term (current) use of anticoagulants: Secondary | ICD-10-CM | POA: Diagnosis not present

## 2021-01-27 LAB — POCT INR: INR: 2 (ref 2.0–3.0)

## 2021-01-27 NOTE — Patient Instructions (Signed)
1 tablet daily, except 0.5 tablet on Monday, Wednesday and Friday.  Repeat INR in 2 weeks

## 2021-01-29 ENCOUNTER — Telehealth: Payer: Self-pay | Admitting: Cardiovascular Disease

## 2021-01-29 ENCOUNTER — Other Ambulatory Visit (HOSPITAL_COMMUNITY): Payer: Self-pay

## 2021-01-29 NOTE — Telephone Encounter (Signed)
Looks like pt needs PA I tried to do this on Cover my meds but it says this is covered Tier 3 pa not required. Filled out pt assistance form-at the front desk for pt to complete and sign. Pt notified, she will be in Monday

## 2021-01-29 NOTE — Telephone Encounter (Signed)
Pt c/o medication issue:  1. Name of Medication:  empagliflozin (JARDIANCE) 10 MG TABS tablet  2. How are you currently taking this medication (dosage and times per day)?  As prescribed   3. Are you having a reaction (difficulty breathing--STAT)?  No   4. What is your medication issue?   Patient states this medication will cost her $11 with insurance and she is unable to afford it. She would like to know if we can offer assistance or an alternative. She states she is completely out of medication. Please advise.

## 2021-02-04 ENCOUNTER — Other Ambulatory Visit: Payer: Self-pay

## 2021-02-04 DIAGNOSIS — Z79899 Other long term (current) drug therapy: Secondary | ICD-10-CM | POA: Diagnosis not present

## 2021-02-05 DIAGNOSIS — U071 COVID-19: Secondary | ICD-10-CM | POA: Diagnosis not present

## 2021-02-05 LAB — BASIC METABOLIC PANEL WITH GFR
BUN/Creatinine Ratio: 20 (ref 12–28)
BUN: 18 mg/dL (ref 8–27)
CO2: 26 mmol/L (ref 20–29)
Calcium: 9.1 mg/dL (ref 8.7–10.3)
Chloride: 103 mmol/L (ref 96–106)
Creatinine, Ser: 0.9 mg/dL (ref 0.57–1.00)
Glucose: 100 mg/dL — ABNORMAL HIGH (ref 65–99)
Potassium: 4.6 mmol/L (ref 3.5–5.2)
Sodium: 144 mmol/L (ref 134–144)
eGFR: 68 mL/min/1.73 (ref >59)

## 2021-02-07 DIAGNOSIS — Z23 Encounter for immunization: Secondary | ICD-10-CM | POA: Diagnosis not present

## 2021-02-10 ENCOUNTER — Encounter: Payer: Self-pay | Admitting: Physician Assistant

## 2021-02-10 ENCOUNTER — Ambulatory Visit (INDEPENDENT_AMBULATORY_CARE_PROVIDER_SITE_OTHER): Payer: Medicare Other

## 2021-02-10 ENCOUNTER — Ambulatory Visit (INDEPENDENT_AMBULATORY_CARE_PROVIDER_SITE_OTHER): Payer: Medicare Other | Admitting: Physician Assistant

## 2021-02-10 ENCOUNTER — Other Ambulatory Visit: Payer: Self-pay

## 2021-02-10 VITALS — BP 125/60 | HR 64 | Ht 61.0 in | Wt 213.6 lb

## 2021-02-10 DIAGNOSIS — I82403 Acute embolism and thrombosis of unspecified deep veins of lower extremity, bilateral: Secondary | ICD-10-CM

## 2021-02-10 DIAGNOSIS — I428 Other cardiomyopathies: Secondary | ICD-10-CM | POA: Diagnosis not present

## 2021-02-10 DIAGNOSIS — Z7901 Long term (current) use of anticoagulants: Secondary | ICD-10-CM

## 2021-02-10 DIAGNOSIS — I499 Cardiac arrhythmia, unspecified: Secondary | ICD-10-CM | POA: Diagnosis not present

## 2021-02-10 DIAGNOSIS — I5042 Chronic combined systolic (congestive) and diastolic (congestive) heart failure: Secondary | ICD-10-CM | POA: Diagnosis not present

## 2021-02-10 DIAGNOSIS — Z79899 Other long term (current) drug therapy: Secondary | ICD-10-CM | POA: Diagnosis not present

## 2021-02-10 DIAGNOSIS — I10 Essential (primary) hypertension: Secondary | ICD-10-CM

## 2021-02-10 LAB — POCT INR: INR: 1.5 — AB (ref 2.0–3.0)

## 2021-02-10 MED ORDER — DAPAGLIFLOZIN PROPANEDIOL 10 MG PO TABS: 10.0000 mg | ORAL_TABLET | Freq: Every day | ORAL | 1 refills | Status: DC

## 2021-02-10 MED ORDER — SPIRONOLACTONE 25 MG PO TABS: 25.0000 mg | ORAL_TABLET | Freq: Every day | ORAL | 2 refills | Status: DC

## 2021-02-10 NOTE — Patient Instructions (Signed)
Take 1.5 tablets tonight only and then continue 1 tablet daily, except 0.5 tablet on Monday, Wednesday and Friday.  Repeat INR in 2 weeks

## 2021-02-10 NOTE — Progress Notes (Signed)
Cardiology Office Note:    Date:  02/12/2021   ID:  Nicole Gibbs, DOB 1946/04/25, MRN [id number]  PCP:  Holland Commons, [place] [facility] Cardiologist:  Quay Burow, MD     Referring MD: [facility] Commons, FNP   Chief Complaint  Patient presents with   Follow-up    Seen for Dr. [name]    History of Present Illness:    Nicole Gibbs is a 75 y.o. 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  Patient was previously referred to Dr. [name] in March 2014 for preoperative evaluation and shortness of breath.  Work-up revealed bilateral DVT and multiple PEs.  She was admitted and placed on Lovenox with Coumadin therapy.  Echocardiogram at the time showed normal EF, mild RV is dysfunction.  Follow-up CTA showed complete resolution of PE however did have incidental noted multinodular goiter.  Lower extremity Doppler also revealed resolution of DVT.  Repeat echocardiogram shows EF 35 to 40% with moderate MR in February 2017.  Myoview in March 2017 showed large fixed anteroseptal, apical and inferolateral perfusion defect suggestive of scar and no reversible ischemia.  Cardiac catheterization in April 2017 however revealed normal coronary arteries.  EF improved to 55% by echo in January 2021.   Patient was recently admitted to the hospital in July 2022 for shortness of breath with exertion.  BNP was elevated at 1930.  Chest x-ray showed central venous congestion and cardiomegaly but no overt edema.  Hemoglobin was normal.  Respiratory panel negative for COVID.  Patient was treated with IV Lasix and admitted for acute on chronic CHF.  Although there was some concern of possible atrial fibrillation, EKG was reviewed by cardiology service and felt to be sinus tachycardia with frequent PACs and PVCs.  Repeat  echocardiogram obtained on 11/25/2020 showed EF 25 to 30%, global hypokinesis, grade 2 DD, RVSP 39.4 mmHg, mild to moderate LAE, mild MR, mild AI.  Metoprolol succinate was increased to 100 mg daily for better PVC suppression.  TSH well-controlled.  She is on Entresto and spironolactone as well.  She was also placed on Jardiance.  She was ultimately discharged on 40 mg daily of Lasix.  Discharge weight 219 pounds.  I initially met her on 8/11 for post hospital follow-up and heart failure medication titration.  Unfortunately she has stopped taking Jardiance, losartan and spironolactone.  I restarted Jardiance and losartan and there was plan to restart spironolactone at a later time unfortunately by the time I saw her back on 01/20/2021, she still has not restarted on the Jardiance or losartan.  I discussed the reasoning behind the Jardiance and losartan with her again.  She returns today for medication titration.  She is compliant with Lasix, beta-blocker, losartan, Jardiance and Lasix.  She has managed to lose 16 pounds since the last visit.  Renal function is stable on blood work last week.  I decided to restart spironolactone today at 25 mg daily.  She will need a repeat blood work BMET in 2 to 3 weeks.  She will return in 4 weeks for further medication titration.  Once heart failure medications titrated, we plan to repeat echocardiogram in 3 months prior to her follow-up with Dr. [name].  She did request a prescription of either Ambien or Lunesta, I do not feel comfortable prescribing controlled substance.  This is a  medication that was previously prescribed by Dr. Claiborne Billings, I will send a message to Dr. Claiborne Billings to see if he would like to prescribe the medication.  She did have irregular heartbeat on physical exam, EKG obtained today shows sinus rhythm with PVCs.  Past Medical History:  Diagnosis Date   Anxiety    Arthritis    knees   Chronic combined systolic and diastolic CHF (congestive heart failure) ([place])     a. 07/2012 Echo: EF 55-60%, Gr1 DD;  b. 06/2015 Echo: EF 35-40%, triv AI, Mod MR, mod dil LA, mildly reduced RV.   Depression    DVT, bilateral lower limbs ([place])    H/O cardiovascular stress test    a. 07/2012 MV: fixed mid-dist anterior and apical defect - scar vs attenuation, distal ant HK, no reversibility, EF 51%-->low risk.   Hx of blood clots    Hypertensive heart disease    Hypothyroidism    Joint pain    Nocturia    Normal coronary arteries    by cardiac catheterization 09/03/15   Obese    OSA on CPAP    Postmenopausal vaginal bleeding    Pulmonary embolism, bilateral (HCC)    a. chronic coumadin.   Rash    under breasts   Skin cancer    Sleep apnea    SOB (shortness of breath)    Swelling of both lower extremities    Thyroid nodule    no meds    Past Surgical History:  Procedure Laterality Date   CARDIAC CATHETERIZATION N/A 09/03/2015   Procedure: Left Heart Cath and Coronary Angiography;  Surgeon: [name], MD;  Location: [facility];  Service: Cardiovascular;  Laterality: N/A;   CARDIOVASCULAR STRESS TEST  08-07-2012  DR HILTY/ DR BERRY   LOW RISK NUCLEAR STUDY/ NO ISCHEMIA/ FIXED MID TO DISTAL ANTERIOR AND APICAL DEFECT MAY REPRESENT SCAR VS BREAST ATTENUATION ARTIFACT/  MILD DISTAL ANTERIOR HYPOKINESIS/ EF 51%   CESAREAN SECTION  1992   COLONOSCOPY N/A 05/08/2015   Procedure: COLONOSCOPY;  Surgeon: Laurence Spates, MD;  Location: [facility];  Service: Endoscopy;  Laterality: N/A;   DILATION AND CURETTAGE OF UTERUS  06/20/2012   Procedure: DILATATION AND CURETTAGE;  Surgeon: [name], MD;  Location: [facility];  Service: Gynecology;  Laterality: N/A;   dilation and evacution  1988   missed ab   [place] N/A 09/04/2012   Procedure: DILATATION & CURETTAGE WITH THERMACHOICE ABLATION;  Surgeon: [name], MD;  Location: [facility];  Service: Gynecology;  Laterality: N/A;    ESOPHAGOGASTRODUODENOSCOPY N/A 05/07/2015   Procedure: ESOPHAGOGASTRODUODENOSCOPY (EGD);  Surgeon: Laurence Spates, MD;  Location: [facility];  Service: Endoscopy;  Laterality: N/A;   HYSTEROSCOPY WITH D & C  03-24-2009   TRANSTHORACIC ECHOCARDIOGRAM  08-08-2012   LVSF NORMAL/ EF 01-09%/  GRADE I DIASTOLIC DYSFUNCTION/ MILD AV AND MV REGURG./  RVSF MILDLY REDUCED    Current Medications: Current Meds  Medication Sig   acetaminophen (TYLENOL) 325 MG tablet Take 650 mg by mouth every 6 (six) hours as needed for moderate pain.   benzonatate (TESSALON) 200 MG capsule Take 1 capsule (200 mg total) by mouth 3 (three) times daily as needed for cough.   dapagliflozin propanediol (FARXIGA) 10 MG TABS tablet Take 1 tablet (10 mg total) by mouth daily before breakfast.   furosemide (LASIX) 40 MG tablet Take 1 tablet (40 mg total) by mouth daily. (Patient taking differently: Take 40  mg by mouth daily. Take 1 tablet 3 times a week)   losartan (COZAAR) 25 MG tablet Take 1 tablet (25 mg total) by mouth daily.   MELATONIN PO Take by mouth at bedtime as needed.   methimazole (TAPAZOLE) 5 MG tablet Take 5 mg by mouth daily.   metoprolol succinate (TOPROL-XL) 100 MG 24 hr tablet Take 1 tablet (100 mg total) by mouth daily. Take with or immediately following a meal.   nitroGLYCERIN (NITROSTAT) 0.4 MG SL tablet Place 1 tablet (0.4 mg total) under the tongue every 5 (five) minutes as needed for chest pain.   nystatin cream (MYCOSTATIN) Apply 1 application topically 2 (two) times daily.   progesterone (PROMETRIUM) 200 MG capsule Take 200 mg by mouth at bedtime.    warfarin (COUMADIN) 3 MG tablet TAKE 1 TO 1 & 1/2 (ONE & ONE-HALF) TABLETS BY MOUTH ONCE DAILY AS DIRECTED   zolpidem (AMBIEN) 5 MG tablet 1 tablet at bedtime.   [DISCONTINUED] empagliflozin (JARDIANCE) 10 MG TABS tablet Take 1 tablet (10 mg total) by mouth daily.   [DISCONTINUED] spironolactone (ALDACTONE) 25 MG tablet Take 1 tablet (25 mg total) by mouth  daily.     Allergies:   Keflex [cephalexin] and Sulfa antibiotics   Social History   Socioeconomic History   Marital status: Widowed    Spouse name: Not on file   Number of children: Not on file   Years of education: Not on file   Highest education level: Not on file  Occupational History   Occupation: retired  Tobacco Use   Smoking status: Never   Smokeless tobacco: Never  Substance and Sexual Activity   Alcohol use: No   Drug use: No   Sexual activity: Not on file  Other Topics Concern   Not on file  Social History Narrative   Not on file   Social Determinants of Health   Financial Resource Strain: Not on file  Food Insecurity: Not on file  Transportation Needs: Not on file  Physical Activity: Not on file  Stress: Not on file  Social Connections: Not on file     Family History: The patient's family history includes Coronary artery disease (age of onset: 64) in her father; Diabetes in her father; Heart disease in her father; Hypertension in her mother; Mental illness in her mother; Stroke in her father; Sudden death in her sister; Thyroid disease in her mother.  ROS:   Please see the history of present illness.     All other systems reviewed and are negative.  EKGs/Labs/Other Studies Reviewed:    The following studies were reviewed today:  Echo 11/25/2020 1. Left ventricular ejection fraction, by estimation, is 25 to 30%. The  left ventricle has severely decreased function. The left ventricle  demonstrates global hypokinesis. There is mild left ventricular  hypertrophy. Left ventricular diastolic parameters   are consistent with Grade II diastolic dysfunction (pseudonormalization).   2. Right ventricular systolic function is mildly reduced. The right  ventricular size is normal. There is mildly elevated pulmonary artery  systolic pressure. The estimated right ventricular systolic pressure is  01.6 mmHg.   3. Left atrial size was mild to moderately dilated.    4. The mitral valve is normal in structure. Mild mitral valve  regurgitation. No evidence of mitral stenosis.   5. The aortic valve is tricuspid. Aortic valve regurgitation is mild.  Mild aortic valve sclerosis is present, with no evidence of aortic valve  stenosis.   6. The inferior  vena cava is dilated in size with >50% respiratory  variability, suggesting right atrial pressure of 8 mmHg.  EKG:  EKG is ordered today.  The ekg ordered today demonstrates normal sinus rhythm, PVCs  Recent Labs: 09/28/2020: ALT 14 11/24/2020: B Natriuretic Peptide 1,930.9 11/25/2020: TSH 2.416 11/29/2020: Magnesium 2.4 11/30/2020: Hemoglobin 14.4; Platelets 284 02/04/2021: BUN 18; Creatinine, Ser 0.90; Potassium 4.6; Sodium 144  Recent Lipid Panel    Component Value Date/Time   CHOL 154 12/16/2019 0000   TRIG 90 12/16/2019 0000   HDL 52 12/16/2019 0000   LDLCALC 84 12/16/2019 0000     Risk Assessment/Calculations:           Physical Exam:    VS:  BP 125/60   Pulse 64   Ht 5' 1"  (1.549 m)   Wt 213 lb 9.6 oz (96.9 kg)   SpO2 96%   BMI 40.36 kg/m     Wt Readings from Last 3 Encounters:  02/10/21 213 lb 9.6 oz (96.9 kg)  01/20/21 229 lb 3.2 oz (104 kg)  12/24/20 219 lb 3.2 oz (99.4 kg)     GEN:  Well nourished, well developed in no acute distress HEENT: Normal NECK: No JVD; No carotid bruits LYMPHATICS: No lymphadenopathy CARDIAC: RRR, no murmurs, rubs, gallops RESPIRATORY:  Clear to auscultation without rales, wheezing or rhonchi  ABDOMEN: Soft, non-tender, non-distended MUSCULOSKELETAL:  No edema; No deformity  SKIN: Warm and dry NEUROLOGIC:  Alert and oriented x 3 PSYCHIATRIC:  Normal affect   ASSESSMENT:    1. Irregular heart rate   2. Medication management   3. Chronic combined systolic and diastolic CHF (congestive heart failure) ([place])   4. NICM (nonischemic cardiomyopathy) ([place])   5. Essential hypertension   6. Deep vein thrombosis (DVT) of both lower extremities,  unspecified chronicity, unspecified vein (HCC)    PLAN:    In order of problems listed above:  Irregular heart rate: EKG showed sinus rhythm with PVCs  Chronic combined systolic and diastolic heart failure: She previously did not take heart failure medication we prescribed her from the hospital, however has since been restarted on majority of the medication.  I will restart spironolactone today as well.  She will return in 4 weeks for further medication titration and consider 6-monthechocardiogram prior to her follow-up with Dr. BGwenlyn Found Nonischemic cardiomyopathy: Last echocardiogram obtained in July showed EF 25 to 30%.  Hypertension: Blood pressure stable on current therapy  History of DVT/PE: On chronic anticoagulation therapy.  On Coumadin         Medication Adjustments/Labs and Tests Ordered: Current medicines are reviewed at length with the patient today.  Concerns regarding medicines are outlined above.  Orders Placed This Encounter  Procedures   Basic Metabolic Panel (BMET)   EKG 12-Lead   Meds ordered this encounter  Medications   spironolactone (ALDACTONE) 25 MG tablet    Sig: Take 1 tablet (25 mg total) by mouth daily.    Dispense:  30 tablet    Refill:  2   dapagliflozin propanediol (FARXIGA) 10 MG TABS tablet    Sig: Take 1 tablet (10 mg total) by mouth daily before breakfast.    Dispense:  90 tablet    Refill:  1    Patient Instructions  Medication Instructions:  FINISH THE REMAINDER OF JARDIANCE AND STOP MEDICATION.  START FARXIGA 10 MG DAILY RESTART SPIRONOLACTONE 25 MG DAILY  *If you need a refill on your cardiac medications before your next appointment, please call  your pharmacy*   Lab Work: Your physician recommends that you return for lab work in 2-3 WEEKS:  BMET   If you have labs (blood work) drawn today and your tests are completely normal, you will receive your results only by: MyChart Message (if you have [place]) OR A paper copy in the  mail If you have any lab test that is abnormal or we need to change your treatment, we will call you to review the results.   Testing/Procedures: NONE ordered at this time of appointment     Follow-Up: At [facility], you and your health needs are our priority.  As part of our continuing mission to provide you with exceptional heart care, we have created designated Provider Care Teams.  These Care Teams include your primary Cardiologist (physician) and Advanced Practice Providers (APPs -  Physician Assistants and Nurse Practitioners) who all work together to provide you with the care you need, when you need it.  We recommend signing up for the patient portal called "MyChart".  Sign up information is provided on this After Visit Summary.  MyChart is used to connect with patients for Virtual Visits (Telemedicine).  Patients are able to view lab/test results, encounter notes, upcoming appointments, etc.  Non-urgent messages can be sent to your provider as well.   To learn more about what you can do with MyChart, go to [email or web address].    Your next appointment:   4 week(s) HEART FAILURE MEDICATION TITRATION  3-4 MONTHS  The format for your next appointment:   In Person In Person  Provider:   APP Quay Burow, MD   Other Instructions   Signed, Almyra Deforest, [place]  02/12/2021 8:36 PM    [place]

## 2021-02-10 NOTE — Patient Instructions (Addendum)
Medication Instructions:  FINISH THE REMAINDER OF JARDIANCE AND STOP MEDICATION.  START FARXIGA 10 MG DAILY RESTART SPIRONOLACTONE 25 MG DAILY  *If you need a refill on your cardiac medications before your next appointment, please call your pharmacy*   Lab Work: Your physician recommends that you return for lab work in 2-3 WEEKS:  BMET   If you have labs (blood work) drawn today and your tests are completely normal, you will receive your results only by: [employer] (if you have MyChart) OR A paper copy in the mail If you have any lab test that is abnormal or we need to change your treatment, we will call you to review the results.   Testing/Procedures: NONE ordered at this time of appointment     Follow-Up: At [facility], you and your health needs are our priority.  As part of our continuing mission to provide you with exceptional heart care, we have created designated Provider Care Teams.  These Care Teams include your primary Cardiologist (physician) and Advanced Practice Providers (APPs -  Physician Assistants and Nurse Practitioners) who all work together to provide you with the care you need, when you need it.  We recommend signing up for the patient portal called "MyChart".  Sign up information is provided on this After Visit Summary.  MyChart is used to connect with patients for Virtual Visits (Telemedicine).  Patients are able to view lab/test results, encounter notes, upcoming appointments, etc.  Non-urgent messages can be sent to your provider as well.   To learn more about what you can do with MyChart, go to [email or web address].    Your next appointment:   4 week(s) HEART FAILURE MEDICATION TITRATION  3-4 MONTHS  The format for your next appointment:   In Person In Person  Provider:   APP Quay Burow, MD   Other Instructions

## 2021-02-12 ENCOUNTER — Encounter: Payer: Self-pay | Admitting: Physician Assistant

## 2021-02-19 ENCOUNTER — Telehealth: Payer: Self-pay | Admitting: Cardiovascular Disease

## 2021-02-19 NOTE — Telephone Encounter (Signed)
Spoke with pt. She state she is unable to afford [place]. Pt state she went to the pharmacy and the cost was $700.  Nurse informed pt of patient assistance foundation. Pt agreeable to fill out application. Samples placed up front in the meantime   Farxiga 10 mg Qty: 2 boxes Lot# [id number] Exp: 04/15/23

## 2021-02-19 NOTE — Telephone Encounter (Signed)
Pt c/o medication issue:  1. Name of Medication: dapagliflozin propanediol (FARXIGA) 10 MG TABS tablet  2. How are you currently taking this medication (dosage and times per day)?   3. Are you having a reaction (difficulty breathing--STAT)? NO  4. What is your medication issue? PT STATES SHE WENT TO THE PHARMACY TO PICK UP THIS SCRIPT AND IT WAS $700, PT WOULD LIKE SOME PHARMACY ASSISTANCE.

## 2021-02-22 ENCOUNTER — Other Ambulatory Visit: Payer: Self-pay | Admitting: Cardiovascular Disease

## 2021-02-24 ENCOUNTER — Ambulatory Visit (INDEPENDENT_AMBULATORY_CARE_PROVIDER_SITE_OTHER): Payer: Medicare Other

## 2021-02-24 ENCOUNTER — Other Ambulatory Visit: Payer: Self-pay

## 2021-02-24 DIAGNOSIS — I82403 Acute embolism and thrombosis of unspecified deep veins of lower extremity, bilateral: Secondary | ICD-10-CM

## 2021-02-24 DIAGNOSIS — Z7901 Long term (current) use of anticoagulants: Secondary | ICD-10-CM | POA: Diagnosis not present

## 2021-02-24 LAB — POCT INR: INR: 2.5 (ref 2.0–3.0)

## 2021-02-24 NOTE — Patient Instructions (Signed)
continue 1 tablet daily, except 0.5 tablet on Monday, Wednesday and Friday.  Repeat INR in 5 weeks

## 2021-02-25 ENCOUNTER — Other Ambulatory Visit: Payer: Self-pay | Admitting: Cardiovascular Disease

## 2021-02-25 DIAGNOSIS — E669 Obesity, unspecified: Secondary | ICD-10-CM | POA: Diagnosis not present

## 2021-02-25 DIAGNOSIS — F5101 Primary insomnia: Secondary | ICD-10-CM | POA: Diagnosis not present

## 2021-02-25 DIAGNOSIS — Z86711 Personal history of pulmonary embolism: Secondary | ICD-10-CM | POA: Diagnosis not present

## 2021-02-25 DIAGNOSIS — I10 Essential (primary) hypertension: Secondary | ICD-10-CM | POA: Diagnosis not present

## 2021-02-25 DIAGNOSIS — I509 Heart failure, unspecified: Secondary | ICD-10-CM | POA: Diagnosis not present

## 2021-02-25 DIAGNOSIS — Z0000 Encounter for general adult medical examination without abnormal findings: Secondary | ICD-10-CM | POA: Diagnosis not present

## 2021-02-25 DIAGNOSIS — Z8249 Family history of ischemic heart disease and other diseases of the circulatory system: Secondary | ICD-10-CM | POA: Diagnosis not present

## 2021-02-25 DIAGNOSIS — G4733 Obstructive sleep apnea (adult) (pediatric): Secondary | ICD-10-CM | POA: Diagnosis not present

## 2021-02-25 DIAGNOSIS — Z7901 Long term (current) use of anticoagulants: Secondary | ICD-10-CM | POA: Diagnosis not present

## 2021-02-25 DIAGNOSIS — E0590 Thyrotoxicosis, unspecified without thyrotoxic crisis or storm: Secondary | ICD-10-CM | POA: Diagnosis not present

## 2021-03-05 ENCOUNTER — Other Ambulatory Visit: Payer: Self-pay | Admitting: Endocrinology

## 2021-03-05 ENCOUNTER — Other Ambulatory Visit: Payer: Self-pay

## 2021-03-05 DIAGNOSIS — Z79899 Other long term (current) drug therapy: Secondary | ICD-10-CM

## 2021-03-05 DIAGNOSIS — E049 Nontoxic goiter, unspecified: Secondary | ICD-10-CM

## 2021-03-06 LAB — BASIC METABOLIC PANEL
BUN/Creatinine Ratio: 23 (ref 12–28)
BUN: 18 mg/dL (ref 8–27)
CO2: 24 mmol/L (ref 20–29)
Calcium: 9.2 mg/dL (ref 8.7–10.3)
Chloride: 106 mmol/L (ref 96–106)
Creatinine, Ser: 0.79 mg/dL (ref 0.57–1.00)
Glucose: 80 mg/dL (ref 70–99)
Potassium: 4.9 mmol/L (ref 3.5–5.2)
Sodium: 142 mmol/L (ref 134–144)
eGFR: 79 mL/min/{1.73_m2} (ref >59)

## 2021-03-10 ENCOUNTER — Ambulatory Visit: Payer: Medicare Other | Admitting: Medical

## 2021-03-10 NOTE — Progress Notes (Deleted)
Cardiology Office Note   Date:  03/10/2021   ID:  Nicole Gibbs, DOB Feb 15, 1946, MRN [id number]  PCP:  Holland Commons, FNP  Cardiologist:  Quay Burow, MD EP: None  No chief complaint on file.     History of Present Illness: Nicole Gibbs is a 75 y.o. female with a PMH of chronic combined CHF/NICM, HTN, HLD, OSA unable to tolerate CPAP, DVT/PE on coumadin, hypothyroidism, who presents for ***  She was last evaluated by cardiology at an outpatient visit with Nicole Deforest, PA-C 02/10/21 for close follow-up  Past Medical History:  Diagnosis Date   Anxiety    Arthritis    knees   Chronic combined systolic and diastolic CHF (congestive heart failure) ([place])    a. 07/2012 Echo: EF 55-60%, Gr1 DD;  b. 06/2015 Echo: EF 35-40%, triv AI, Mod MR, mod dil LA, mildly reduced RV.   Depression    DVT, bilateral lower limbs ([place])    H/O cardiovascular stress test    a. 07/2012 MV: fixed mid-dist anterior and apical defect - scar vs attenuation, distal ant HK, no reversibility, EF 51%-->low risk.   Hx of blood clots    Hypertensive heart disease    Hypothyroidism    Joint pain    Nocturia    Normal coronary arteries    by cardiac catheterization 09/03/15   Obese    OSA on CPAP    Postmenopausal vaginal bleeding    Pulmonary embolism, bilateral (HCC)    a. chronic coumadin.   Rash    under breasts   Skin cancer    Sleep apnea    SOB (shortness of breath)    Swelling of both lower extremities    Thyroid nodule    no meds    Past Surgical History:  Procedure Laterality Date   CARDIAC CATHETERIZATION N/A 09/03/2015   Procedure: Left Heart Cath and Coronary Angiography;  Surgeon: Nicole Harp, MD;  Location: [facility];  Service: Cardiovascular;  Laterality: N/A;   CARDIOVASCULAR STRESS TEST  08-07-2012  DR HILTY/ DR BERRY   LOW RISK NUCLEAR STUDY/ NO ISCHEMIA/ FIXED MID TO DISTAL ANTERIOR AND APICAL DEFECT MAY REPRESENT SCAR VS BREAST ATTENUATION ARTIFACT/  MILD DISTAL  ANTERIOR HYPOKINESIS/ EF 51%   CESAREAN SECTION  1992   COLONOSCOPY N/A 05/08/2015   Procedure: COLONOSCOPY;  Surgeon: Nicole Spates, MD;  Location: [facility];  Service: Endoscopy;  Laterality: N/A;   DILATION AND CURETTAGE OF UTERUS  06/20/2012   Procedure: DILATATION AND CURETTAGE;  Surgeon: Nicole Mourning, MD;  Location: [facility];  Service: Gynecology;  Laterality: N/A;   dilation and evacution  1988   missed ab   [place] N/A 09/04/2012   Procedure: DILATATION & CURETTAGE WITH THERMACHOICE ABLATION;  Surgeon: Nicole Mourning, MD;  Location: [facility];  Service: Gynecology;  Laterality: N/A;   ESOPHAGOGASTRODUODENOSCOPY N/A 05/07/2015   Procedure: ESOPHAGOGASTRODUODENOSCOPY (EGD);  Surgeon: Nicole Spates, MD;  Location: [facility];  Service: Endoscopy;  Laterality: N/A;   HYSTEROSCOPY WITH D & C  03-24-2009   TRANSTHORACIC ECHOCARDIOGRAM  08-08-2012   LVSF NORMAL/ EF 33-82%/  GRADE I DIASTOLIC DYSFUNCTION/ MILD AV AND MV REGURG./  RVSF MILDLY REDUCED     Current Outpatient Medications  Medication Sig Dispense Refill   acetaminophen (TYLENOL) 325 MG tablet Take 650 mg by mouth every 6 (six) hours as needed for moderate pain.     benzonatate (TESSALON) 200 MG capsule  Take 1 capsule (200 mg total) by mouth 3 (three) times daily as needed for cough. 20 capsule 0   dapagliflozin propanediol (FARXIGA) 10 MG TABS tablet Take 1 tablet (10 mg total) by mouth daily before breakfast. 90 tablet 1   furosemide (LASIX) 40 MG tablet Take 1 tablet (40 mg total) by mouth daily. (Patient taking differently: Take 40 mg by mouth daily. Take 1 tablet 3 times a week) 30 tablet 2   losartan (COZAAR) 25 MG tablet Take 1 tablet (25 mg total) by mouth daily. 90 tablet 2   MELATONIN PO Take by mouth at bedtime as needed.     methimazole (TAPAZOLE) 5 MG tablet Take 5 mg by mouth daily.     metoprolol succinate (TOPROL-XL) 100 MG 24 hr  tablet Take 1 tablet (100 mg total) by mouth daily. Take with or immediately following a meal. 30 tablet 2   nitroGLYCERIN (NITROSTAT) 0.4 MG SL tablet Place 1 tablet (0.4 mg total) under the tongue every 5 (five) minutes as needed for chest pain. 25 tablet 3   nystatin cream (MYCOSTATIN) Apply 1 application topically 2 (two) times daily. 30 g 0   progesterone (PROMETRIUM) 200 MG capsule Take 200 mg by mouth at bedtime.      spironolactone (ALDACTONE) 25 MG tablet Take 1 tablet (25 mg total) by mouth daily. 30 tablet 2   warfarin (COUMADIN) 3 MG tablet TAKE 1 TO 1 & 1/2 (ONE & ONE-HALF) TABLETS BY MOUTH ONCE DAILY AS DIRECTED 60 tablet 0   zolpidem (AMBIEN) 5 MG tablet 1 tablet at bedtime.     No current facility-administered medications for this visit.    Allergies:   Keflex [cephalexin] and Sulfa antibiotics    Social History:  The patient  reports that she has never smoked. She has never used smokeless tobacco. She reports that she does not drink alcohol and does not use drugs.   Family History:  The patient's ***family history includes Coronary artery disease (age of onset: 34) in her father; Diabetes in her father; Heart disease in her father; Hypertension in her mother; Mental illness in her mother; Stroke in her father; Sudden death in her sister; Thyroid disease in her mother.    ROS:  Please see the history of present illness.   Otherwise, review of systems are positive for {NONE DEFAULTED:18576}.   All other systems are reviewed and negative.    PHYSICAL EXAM: VS:  There were no vitals taken for this visit. , BMI There is no height or weight on file to calculate BMI. GEN: Well nourished, well developed, in no acute distress HEENT: normal Neck: no JVD, carotid bruits, or masses Cardiac: ***RRR; no murmurs, rubs, or gallops,no edema  Respiratory:  clear to auscultation bilaterally, normal work of breathing GI: soft, nontender, nondistended, + BS MS: no deformity or  atrophy Skin: warm and dry, no rash Neuro:  Strength and sensation are intact Psych: euthymic mood, full affect   EKG:  EKG {ACTION; IS/IS PFX:90240973} ordered today. The ekg ordered today demonstrates ***   Recent Labs: 09/28/2020: ALT 14 11/24/2020: B Natriuretic Peptide 1,930.9 11/25/2020: TSH 2.416 11/29/2020: Magnesium 2.4 11/30/2020: Hemoglobin 14.4; Platelets 284 03/05/2021: BUN 18; Creatinine, Ser 0.79; Potassium 4.9; Sodium 142    Lipid Panel    Component Value Date/Time   CHOL 154 12/16/2019 0000   TRIG 90 12/16/2019 0000   HDL 52 12/16/2019 0000   LDLCALC 84 12/16/2019 0000      Wt Readings from Last  3 Encounters:  02/10/21 213 lb 9.6 oz (96.9 kg)  01/20/21 229 lb 3.2 oz (104 kg)  12/24/20 219 lb 3.2 oz (99.4 kg)      Other studies Reviewed: Additional studies/ records that were reviewed today include: ***.    ASSESSMENT AND PLAN:  1.  ***   Current medicines are reviewed at length with the patient today.  The patient {ACTIONS; HAS/DOES NOT HAVE:19233} concerns regarding medicines.  The following changes have been made:  {PLAN; NO CHANGE:13088:s}  Labs/ tests ordered today include: *** No orders of the defined types were placed in this encounter.    Disposition:   FU with *** in {gen number 2-52:712929} {Days to years:10300}  Signed, Abigail Butts, PA-C  03/10/2021 10:25 AM

## 2021-03-15 ENCOUNTER — Telehealth: Payer: Self-pay | Admitting: Cardiovascular Disease

## 2021-03-15 NOTE — Telephone Encounter (Signed)
  Pt c/o medication issue:  1. Name of Medication: dapagliflozin propanediol (FARXIGA) 10 MG TABS tablet  2. How are you currently taking this medication (dosage and times per day)? Take 1 tablet (10 mg total) by mouth daily before breakfast.  3. Are you having a reaction (difficulty breathing--STAT)?   4. What is your medication issue? Pt said she needs help with the pts assistance form

## 2021-03-15 NOTE — Telephone Encounter (Signed)
Spoke with pt regarding farxiga patient assistance. Pt's questions about form were answered. Additional samples provided to pt. She will pick them up when she drops off her form in the next couple of days.   Medication samples have been provided to the patient.  Drug name: Wilder Glade 10mg  Qty: 3 LOT: [id number] Exp.Date: 08/2023  Samples left at front desk for patient pick-up. Patient notified.

## 2021-03-30 ENCOUNTER — Ambulatory Visit (INDEPENDENT_AMBULATORY_CARE_PROVIDER_SITE_OTHER): Payer: Medicare Other | Admitting: Cardiovascular Disease

## 2021-03-30 ENCOUNTER — Encounter: Payer: Self-pay | Admitting: Cardiovascular Disease

## 2021-03-30 ENCOUNTER — Other Ambulatory Visit: Payer: Self-pay

## 2021-03-30 ENCOUNTER — Ambulatory Visit (INDEPENDENT_AMBULATORY_CARE_PROVIDER_SITE_OTHER): Payer: Medicare Other | Admitting: *Deleted

## 2021-03-30 DIAGNOSIS — I10 Essential (primary) hypertension: Secondary | ICD-10-CM | POA: Diagnosis not present

## 2021-03-30 DIAGNOSIS — I82403 Acute embolism and thrombosis of unspecified deep veins of lower extremity, bilateral: Secondary | ICD-10-CM

## 2021-03-30 DIAGNOSIS — I82401 Acute embolism and thrombosis of unspecified deep veins of right lower extremity: Secondary | ICD-10-CM | POA: Diagnosis not present

## 2021-03-30 DIAGNOSIS — Z5181 Encounter for therapeutic drug level monitoring: Secondary | ICD-10-CM

## 2021-03-30 DIAGNOSIS — Z7901 Long term (current) use of anticoagulants: Secondary | ICD-10-CM

## 2021-03-30 DIAGNOSIS — I5042 Chronic combined systolic (congestive) and diastolic (congestive) heart failure: Secondary | ICD-10-CM

## 2021-03-30 LAB — POCT INR: INR: 1.6 — AB (ref 2.0–3.0)

## 2021-03-30 MED ORDER — WARFARIN SODIUM 3 MG PO TABS: ORAL_TABLET | ORAL | 0 refills | Status: DC

## 2021-03-30 NOTE — Patient Instructions (Addendum)
Medication Instructions:  °Your physician recommends that you continue on your current medications as directed. Please refer to the Current Medication list given to you today. ° °*If you need a refill on your cardiac medications before your next appointment, please call your pharmacy* ° ° °Follow-Up: °At [facility], you and your health needs are our priority.  As part of our continuing mission to provide you with exceptional heart care, we have created designated Provider Care Teams.  These Care Teams include your primary Cardiologist (physician) and Advanced Practice Providers (APPs -  Physician Assistants and Nurse Practitioners) who all work together to provide you with the care you need, when you need it. ° °We recommend signing up for the patient portal called "MyChart".  Sign up information is provided on this After Visit Summary.  MyChart is used to connect with patients for Virtual Visits (Telemedicine).  Patients are able to view lab/test results, encounter notes, upcoming appointments, etc.  Non-urgent messages can be sent to your provider as well.   °To learn more about what you can do with MyChart, go to [email or web address].   ° °Your next appointment:   °3 month(s) ° °The format for your next appointment:   °In Person ° °Provider:   °Jesse Cleaver, FNP, Angela Duke, PA-C, Callie Goodrich, PA-C, Jennifer, Lambert, PA-C, Kathryn Lawrence, DNP, ANP, or Hao Meng, PA-C     ° ° °Then, Jonathan Berry, MD will plan to see you again in 6 month(s).  °

## 2021-03-30 NOTE — Progress Notes (Signed)
03/30/2021 Nicole Gibbs   [birth year]  [id number]  Primary Physician Adria Dill [name], FNP Primary Cardiologist: [name] MD Nicole Gibbs, [place]  HPI:  Nicole Gibbs is a 75 y.o.  severely overweight, widowed Caucasian female (husband died 3 years ago), mother of one child, who I last saw him in the office 10/14/2020.  She currently lives alone, goes out that dinner most nights.  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 She had normal LV systolic function and mild RV dysfunction. Ultimately, because of vaginal bleeding, she underwent a D&C with endometrial ablation by Dr. Helane Rima September 04, 3380, without complication. She is no longer bleeding, and her shortness of breath has improved. Her Coumadin is followed here in our office. A followup CT angiogram revealed complete resolution of her pulmonary emboli but she did have an incidentally noted multinodular goiter. Follow-up lower extremity venous Doppler study showed resolution of her DVT. She does complain of progressive dyspnea which is somewhat limiting. She has been admitted to [facility] in December and again in February with anemia and heart failure. A recent Myoview stress test showed anteroapical scar and a 2-D echo revealed an ejection fraction of 35-40% which represents this is a significant decline compared to her EF 3 years ago which was normal.. She has had a negative upper and lower endoscopy with plans to perform pill endoscopy as well. Her hemoglobin has recently remained stable. I performed outpatient cardiac catheterization on her 09/03/15 revealing normal coronary arteries with ejection fraction 35-445% consistent with a nonischemic cardiomyopathy.   Since I saw her in the office 6 months ago she was hospitalized for a week in July with volume overload.  She  admits to dietary indiscretion with regards to salt.  She has had issues with medication compliance and affordability.  Her weight has remained stable over the last several months.  She denies shortness of breath.   Current Meds  Medication Sig   dapagliflozin propanediol (FARXIGA) 10 MG TABS tablet Take 1 tablet (10 mg total) by mouth daily before breakfast.   furosemide (LASIX) 40 MG tablet Take 1 tablet (40 mg total) by mouth daily. (Patient taking differently: Take 40 mg by mouth daily. Take 1 tablet 3 times a week)   methimazole (TAPAZOLE) 5 MG tablet Take 5 mg by mouth daily.   progesterone (PROMETRIUM) 200 MG capsule Take 200 mg by mouth at bedtime.    zolpidem (AMBIEN) 5 MG tablet 1 tablet at bedtime.     Allergies  Allergen Reactions   Keflex [Cephalexin] Diarrhea   Sulfa Antibiotics Hives    Social History   Socioeconomic History   Marital status: Widowed    Spouse name: Not on file   Number of children: Not on file   Years of education: Not on file   Highest education level: Not on file  Occupational History   Occupation: retired  Tobacco Use   Smoking status: Never   Smokeless tobacco: Never  Substance and Sexual Activity   Alcohol use: No   Drug use: No   Sexual activity: Not on file  Other Topics Concern   Not on file  Social History Narrative   Not on file   Social Determinants of Health   Financial Resource Strain: Not on file  Food Insecurity: Not on file  Transportation Needs:  Not on file  Physical Activity: Not on file  Stress: Not on file  Social Connections: Not on file  Intimate Partner Violence: Not on file     Review of Systems: General: negative for chills, fever, night sweats or weight changes.  Cardiovascular: negative for chest pain, dyspnea on exertion, edema, orthopnea, palpitations, paroxysmal nocturnal dyspnea or shortness of breath Dermatological: negative for rash Respiratory: negative for cough or wheezing Urologic: negative  for hematuria Abdominal: negative for nausea, vomiting, diarrhea, bright red blood per rectum, melena, or hematemesis Neurologic: negative for visual changes, syncope, or dizziness All other systems reviewed and are otherwise negative except as noted above.    Blood pressure (!) 122/54, pulse 66, height 5\' 1"  (1.549 m), weight 216 lb (98 kg), SpO2 97 %.  General appearance: alert and no distress Neck: no adenopathy, no carotid bruit, no JVD, supple, symmetrical, trachea midline, and thyroid not enlarged, symmetric, no tenderness/mass/nodules Lungs: clear to auscultation bilaterally Heart: regular rate and rhythm, S1, S2 normal, no murmur, click, rub or gallop Extremities: 1-2+ pitting edema bilaterally Pulses: 2+ and symmetric Skin: Skin color, texture, turgor normal. No rashes or lesions Neurologic: Grossly normal  EKG sinus rhythm at 66 with left ventricular hypertrophy, short PR interval and potential preexcitation.  I personally reviewed this EKG.  ASSESSMENT AND PLAN:   DVT, bilat - office dopplers 08/07/12 History of bilateral DVTs and pulmonary emboli in the past currently on Coumadin oral anticoagulation.  Essential hypertension History of essential hypertension a blood pressure measured today 122/54.  It is unclear what medication she is taking currently.  Chronic combined systolic and diastolic CHF (congestive heart failure) ([place]) Ms. Spradley has a history of nonischemic cardiomyopathy.  I performed cardiac catheterization on her 2017 revealing normal coronary arteries.  She was recently hospitalized for a week in July with volume overload and was diuresed.  2D echo revealed EF of 25 to 30%.  She saw Almyra Deforest, PA-C back after that and apparently there were issues with medication compliance and affordability.  It is unclear what medication she is currently on.  She lives alone, goes out to eat most nights and does admit to dietary indiscretion with regards to salt.  I am going to  have her follow-up with our Pharm.D.'s to reconcile her medications, will her cost and titrate.  When she is on a stable dose of medications we can think about rechecking a 2D echocardiogram in several months.     [name] MD FACP,FACC,FAHA, [facility] 03/30/2021 1:52 PM

## 2021-03-30 NOTE — Assessment & Plan Note (Signed)
Nicole Gibbs has a history of nonischemic cardiomyopathy.  I performed cardiac catheterization on her 2017 revealing normal coronary arteries.  She was recently hospitalized for a week in July with volume overload and was diuresed.  2D echo revealed EF of 25 to 30%.  She saw Almyra Deforest, PA-C back after that and apparently there were issues with medication compliance and affordability.  It is unclear what medication she is currently on.  She lives alone, goes out to eat most nights and does admit to dietary indiscretion with regards to salt.  I am going to have her follow-up with our Pharm.D.'s to reconcile her medications, will her cost and titrate.  When she is on a stable dose of medications we can think about rechecking a 2D echocardiogram in several months.

## 2021-03-30 NOTE — Patient Instructions (Signed)
Description   Take 1.5 tablets of warfarin today.  Tomorrow take 1 tablets of warfarin. Then continue to take warfarin 1 tablet daily except for 1/2 a tablet on Monday, Wednesday and Friday. Recheck INR in 2 weeks. Cou[facility]in [facility] [phone].

## 2021-03-30 NOTE — Assessment & Plan Note (Signed)
History of bilateral DVTs and pulmonary emboli in the past currently on Coumadin oral anticoagulation.

## 2021-03-30 NOTE — Assessment & Plan Note (Signed)
History of essential hypertension a blood pressure measured today 122/54.  It is unclear what medication she is taking currently.

## 2021-04-05 ENCOUNTER — Telehealth: Payer: Self-pay | Admitting: Licensed Clinical Social Worker

## 2021-04-05 NOTE — Progress Notes (Signed)
Heart and Vascular Care Navigation  04/05/2021  Nicole Gibbs 10-25-1945 [id number]  Reason for Referral:  Medication affordability; enrollment in Part D.  Engaged with patient by telephone for initial visit for Heart and Vascular Care Coordination.                                                                                                   Assessment:                                     LCSW received a return call from pt. I introduced self, role, reason for call. Pt confimed home address, she lives alone, and her PCP. She has an adult daughter who lives in [place] for grad school. She drives and takes care of herself since her husband passed three years ago. She was not aware that she had not enrolled in a part D plan since he usually managed those things when he was alive and she had not made changes in her plan since. She has generally been making ends meet for her medications since she was on mostly generic medications that were affordable at [facility]. She has been started on Jardiance and Farxiga and it was discussed that she may be able to utilize Eliquis instead of taking coumadin and coming for regular appts. She is concerned about the costs of these medications. During her appt last week she was given the [facility] line and called and left a message with her contact information. She recognizes that it is a busy time for their team and she hasnt received a call back. Her home and car are paid off and her monthly income is sufficient; denies any issues with utilities or food.   I shared that I would reach out to Willey Blade, coordinator w/ [facility] and ensure she is on their list for a call back. I encouraged her to make an appt if possible when they call and specifically discuss the need to enroll in a Part D prescription plan. Pt states understanding. If I hear back from [place] before she does then I will call and provide an update w/ whatever information he gives. Also encouraged her to call me back if any  additional questions/concerns.   HRT/VAS Care Coordination     Patients Home Cardiology Office [place] Team Social Worker   Social Worker Name: Nicole Gibbs, [place] [facility] [phone]   Living arrangements for the past 2 months Single Family Home   Lives with: Self   Patient Current Insurance Coverage Traditional Medicare  AARP supplement   Patient Has Concern With Paying Medical Bills No   Does Patient Have Prescription Coverage? No   Home Assistive Devices/Equipment Eyeglasses; Cane (specify quad or straight)   DME Agency NA   [place]   Current home services DME  w/chair, walker, cane , BSC       Social History:  SDOH Screenings   Alcohol Screen: Not on file  Depression (PHQ2-9): Not on file  Financial Resource Strain: Low Risk    Difficulty of Paying Living Expenses: Not very hard  Food Insecurity: No Food Insecurity   Worried About Running Out of Food in the Last Year: Never true   Ran Out of Food in the Last Year: Never true  Housing: Low Risk    Last Housing Risk Score: 0  Physical Activity: Not on file  Social Connections: Not on file  Stress: Not on file  Tobacco Use: Low Risk    Smoking Tobacco Use: Never   Smokeless Tobacco Use: Never   Passive Exposure: Not on file  Transportation Needs: No Transportation Needs   Lack of Transportation (Medical): No   Lack of Transportation (Non-Medical): No    SDOH Interventions: Financial Resources:  Financial Strain Interventions: Other (Comment) (referral to [facility] counselor for enrollment in Part D)  Food Insecurity:  Food Insecurity Interventions: Intervention Not Indicated  Housing Insecurity:  Housing Interventions: Intervention Not Indicated  Transportation:   Transportation Interventions: Intervention Not Indicated     Other Care Navigation Interventions:     Provided  Pharmacy assistance resources  Referral to [facility] for Part D Enrollment  Patient expressed Mental Health concerns No. Lost husband three years ago but is doing okay overall; has supportive daughter.    Follow-up plan:   LCSW has emailed Willey Blade re: pt's concerns. I provided her with my name/number again and encouraged her to reach out to me. I did receive an email back from [place] that states his counselor will assist her with enrolling in a Part D plan. I'll call tomorrow and confirm pt has been in contact with them.

## 2021-04-05 NOTE — Telephone Encounter (Signed)
Attempted to reach pt this morning at [phone] to discuss referral to [facility] line that had been encouraged last week during her appt. Per notes and pharmacy messages pt has not been able to afford her medications as she never signed up for a part D. LCSW was unable to reach pt, left message requesting call back. Will reattempt again before clinic closes for holiday.   Westley Hummer, MSW, [place]  [phone]

## 2021-04-06 ENCOUNTER — Telehealth: Payer: Self-pay | Admitting: Licensed Clinical Social Worker

## 2021-04-06 ENCOUNTER — Other Ambulatory Visit: Payer: Self-pay

## 2021-04-06 DIAGNOSIS — L814 Other melanin hyperpigmentation: Secondary | ICD-10-CM | POA: Diagnosis not present

## 2021-04-06 DIAGNOSIS — Z85828 Personal history of other malignant neoplasm of skin: Secondary | ICD-10-CM | POA: Diagnosis not present

## 2021-04-06 DIAGNOSIS — L820 Inflamed seborrheic keratosis: Secondary | ICD-10-CM | POA: Diagnosis not present

## 2021-04-06 DIAGNOSIS — L304 Erythema intertrigo: Secondary | ICD-10-CM | POA: Diagnosis not present

## 2021-04-06 DIAGNOSIS — L821 Other seborrheic keratosis: Secondary | ICD-10-CM | POA: Diagnosis not present

## 2021-04-06 NOTE — Addendum Note (Signed)
Addended by: Orma Render on: 04/06/2021 04:55 PM   Modules accepted: Orders

## 2021-04-06 NOTE — Telephone Encounter (Signed)
Spoke with pt this morning, she confirms that she spoke with Willey Blade at [facility] and has been assigned a Medical sales representative to speak with her about her options Monday. She is still requesting samples however I deferred that until after her conversation with [facility] counselor. She has enough at this time to continue her dose. Pt in good spirits after speaking with [facility] team, appreciative of referral and f/u. LCSW remains available as needed and will speak with her next Wednesday to see what was completed/discussed.   Westley Hummer, MSW, [place]  [phone]- work cell phone (preferred) [phone]- desk phone

## 2021-04-06 NOTE — Addendum Note (Signed)
Addended by: Orma Render on: 04/06/2021 04:56 PM   Modules accepted: Orders

## 2021-04-12 ENCOUNTER — Telehealth: Payer: Self-pay | Admitting: Physician Assistant

## 2021-04-12 NOTE — Telephone Encounter (Signed)
Pt c/o medication issue:  1. Name of Medication:  dapagliflozin propanediol (FARXIGA) 10 MG TABS tablet  2. How are you currently taking this medication (dosage and times per day)? Has not taken since yesterday   3. Are you having a reaction (difficulty breathing--STAT)? No  4. What is your medication issue? [place] is calling stating that she is completely out of this medication and it would cost her $700 to refill it. She states she thought she had another box of samples, but doesn't. She is wanting assist with lowering the cost of the medication or to be prescribed and alternative. She is also requesting samples in the meantime.

## 2021-04-12 NOTE — Telephone Encounter (Signed)
Called patient, she states that she can not afford the [place], or the [place]. It is almost $700 a month for medication. I did supply her with samples of Farxiga 10 mg until we can discuss what other medications we can do.  Patient verbalized understanding, thankful for call back- will wait to get Dr.Berry's recommendations.

## 2021-04-13 ENCOUNTER — Telehealth: Payer: Self-pay | Admitting: Licensed Clinical Social Worker

## 2021-04-13 NOTE — Telephone Encounter (Signed)
LCSW attempted to reach pt this morning to discuss her call w/ [facility] counselor. Noted that pt had called in about samples and cost of medications. Pt was to speak with counselor about enrolling in Part D. Until she does so medications are going to be costly, due to her monthly income she will not meet criteria for Medicaid, Extra Help or financial assistance through manufacturer. Unable to reach pt, phone rang as busy, and could not leave message. Will reattempt tomorrow as we had discussed.   Westley Hummer, MSW, [place]  [phone]- work cell phone (preferred) [phone]- desk phone

## 2021-04-14 ENCOUNTER — Telehealth: Payer: Self-pay | Admitting: Licensed Clinical Social Worker

## 2021-04-14 ENCOUNTER — Other Ambulatory Visit: Payer: Self-pay

## 2021-04-14 ENCOUNTER — Ambulatory Visit (INDEPENDENT_AMBULATORY_CARE_PROVIDER_SITE_OTHER): Payer: Medicare Other

## 2021-04-14 DIAGNOSIS — Z7901 Long term (current) use of anticoagulants: Secondary | ICD-10-CM | POA: Diagnosis not present

## 2021-04-14 DIAGNOSIS — I82403 Acute embolism and thrombosis of unspecified deep veins of lower extremity, bilateral: Secondary | ICD-10-CM | POA: Diagnosis not present

## 2021-04-14 DIAGNOSIS — Z5181 Encounter for therapeutic drug level monitoring: Secondary | ICD-10-CM | POA: Diagnosis not present

## 2021-04-14 LAB — POCT INR: INR: 1.4 — AB (ref 2.0–3.0)

## 2021-04-14 NOTE — Telephone Encounter (Signed)
Attempted to reach pt this afternoon- her voicemail now states that calls are being screened and my number is not able to leave a voicemail. I was able to utilize my desk phone and leave a voicemail for pt at [phone]. Requested return call.   Westley Hummer, MSW, [place]  [phone]- work cell phone (preferred) [phone]- desk phone

## 2021-04-14 NOTE — Patient Instructions (Signed)
Description   Take 1 tablets of warfarin today and then START taking warfarin 1 tablet daily except for 1/2 a tablet on Wednesdays. Recheck INR in 1 week. Coumadin Clinic [phone].

## 2021-04-16 ENCOUNTER — Telehealth: Payer: Self-pay | Admitting: Licensed Clinical Social Worker

## 2021-04-16 ENCOUNTER — Other Ambulatory Visit (HOSPITAL_COMMUNITY): Payer: Self-pay

## 2021-04-16 NOTE — Telephone Encounter (Signed)
LCSW able to reach pt this morning, she apologizes that she blocked my cell but is able to get my calls from desk phone. Inquired about [facility] call- she shares that the counselor was able to find that she does have prescription coverage under [employer] and there isnt really another option that would bring down those costs/cover the medications completely. I shared that with her income likely she would not qualify for patient assistance at this time. I shared that I would pass on this information to Dr. Berry/Kryestyn, RN, to see if any alternatives.  I also f/u with Liz, [employer], who was able to run benefits, confirm that she does have prescription coverage. At this time pt has not met her deductible which is why her costs are so high for those medications. Once she meets that deductible it may lower the costs but I cannot tell her what it would be after that time. If pt has not used her 30 day free cards that is an option, as is samples, but neither are long term solutions to the above.   The above information has been routed to Dr. Berry and his nurse.    , MSW, LCSW Clinical Social Worker II [place] [facility]  [phone]- work cell phone (preferred) [phone]- desk phone  

## 2021-04-16 NOTE — Telephone Encounter (Signed)
LCSW spoke with pt this morning, she shares that after speaking w/ Medicare [facility] counselor that it was determined she does have coverage under [employer]. I had our pharmacy tech Kathlee Nations at the [facility] clinic run those benefits. She has not yet met her deductible which is why she has such high costs for these medications. I am not sure if she has used a 30 day free card but that is one option, and samples (but I understand she can't rely on these). Unfortunately, until she meets her deductible it is going to continue to be pricey. Her income is too significant for patient assistance (she receives SSI and  her husband's pension). There aren't really many good answers, she may find if she pays for the medications a few times then she'll meet that deductible and the price will decrease.   Westley Hummer, MSW, [place]  [phone]- work cell phone (preferred) [phone]- desk phone

## 2021-04-21 ENCOUNTER — Ambulatory Visit (INDEPENDENT_AMBULATORY_CARE_PROVIDER_SITE_OTHER): Payer: Medicare Other

## 2021-04-21 ENCOUNTER — Other Ambulatory Visit: Payer: Self-pay

## 2021-04-21 DIAGNOSIS — I82403 Acute embolism and thrombosis of unspecified deep veins of lower extremity, bilateral: Secondary | ICD-10-CM | POA: Diagnosis not present

## 2021-04-21 DIAGNOSIS — Z7901 Long term (current) use of anticoagulants: Secondary | ICD-10-CM

## 2021-04-21 LAB — POCT INR: INR: 2 (ref 2.0–3.0)

## 2021-04-21 NOTE — Patient Instructions (Signed)
Continue  taking warfarin 1 tablet daily except for 1/2 a tablet on Wednesdays. Recheck INR in 4 weeks. Coumadin Clinic [phone].

## 2021-04-23 DIAGNOSIS — Z23 Encounter for immunization: Secondary | ICD-10-CM | POA: Diagnosis not present

## 2021-05-19 ENCOUNTER — Ambulatory Visit (INDEPENDENT_AMBULATORY_CARE_PROVIDER_SITE_OTHER): Payer: Medicare Other

## 2021-05-19 ENCOUNTER — Other Ambulatory Visit: Payer: Self-pay

## 2021-05-19 DIAGNOSIS — Z7901 Long term (current) use of anticoagulants: Secondary | ICD-10-CM | POA: Diagnosis not present

## 2021-05-19 DIAGNOSIS — I82403 Acute embolism and thrombosis of unspecified deep veins of lower extremity, bilateral: Secondary | ICD-10-CM | POA: Diagnosis not present

## 2021-05-19 LAB — POCT INR: INR: 1.7 — AB (ref 2.0–3.0)

## 2021-05-19 NOTE — Patient Instructions (Signed)
TAKE 1 TABLET TONIGHT and then Continue  taking warfarin 1 tablet daily except for 1/2 a tablet on Wednesdays. Recheck INR in 3 weeks. Coumadin Clinic [phone].

## 2021-06-09 ENCOUNTER — Ambulatory Visit (INDEPENDENT_AMBULATORY_CARE_PROVIDER_SITE_OTHER): Payer: Medicare Other

## 2021-06-09 ENCOUNTER — Other Ambulatory Visit: Payer: Self-pay

## 2021-06-09 DIAGNOSIS — I82403 Acute embolism and thrombosis of unspecified deep veins of lower extremity, bilateral: Secondary | ICD-10-CM | POA: Diagnosis not present

## 2021-06-09 DIAGNOSIS — Z7901 Long term (current) use of anticoagulants: Secondary | ICD-10-CM | POA: Diagnosis not present

## 2021-06-09 LAB — POCT INR: INR: 2.3 (ref 2.0–3.0)

## 2021-06-09 MED ORDER — WARFARIN SODIUM 3 MG PO TABS: ORAL_TABLET | ORAL | 0 refills | Status: DC

## 2021-06-09 NOTE — Patient Instructions (Signed)
INCREASE to 1 tablet daily. Recheck INR in 6 weeks. Coumadin Clinic [phone].

## 2021-06-15 ENCOUNTER — Ambulatory Visit: Payer: Medicare Other | Admitting: Cardiovascular Disease

## 2021-06-24 DIAGNOSIS — E0590 Thyrotoxicosis, unspecified without thyrotoxic crisis or storm: Secondary | ICD-10-CM | POA: Diagnosis not present

## 2021-06-24 DIAGNOSIS — I10 Essential (primary) hypertension: Secondary | ICD-10-CM | POA: Diagnosis not present

## 2021-06-30 ENCOUNTER — Other Ambulatory Visit: Payer: Self-pay

## 2021-06-30 ENCOUNTER — Encounter: Payer: Self-pay | Admitting: Physician Assistant

## 2021-06-30 ENCOUNTER — Ambulatory Visit (INDEPENDENT_AMBULATORY_CARE_PROVIDER_SITE_OTHER): Payer: Medicare Other | Admitting: Nurse Practitioner

## 2021-06-30 VITALS — BP 140/72 | HR 47 | Ht 61.0 in | Wt 216.0 lb

## 2021-06-30 DIAGNOSIS — I10 Essential (primary) hypertension: Secondary | ICD-10-CM

## 2021-06-30 DIAGNOSIS — I2699 Other pulmonary embolism without acute cor pulmonale: Secondary | ICD-10-CM

## 2021-06-30 DIAGNOSIS — I824Y3 Acute embolism and thrombosis of unspecified deep veins of proximal lower extremity, bilateral: Secondary | ICD-10-CM

## 2021-06-30 DIAGNOSIS — G4733 Obstructive sleep apnea (adult) (pediatric): Secondary | ICD-10-CM | POA: Diagnosis not present

## 2021-06-30 DIAGNOSIS — I5042 Chronic combined systolic (congestive) and diastolic (congestive) heart failure: Secondary | ICD-10-CM | POA: Diagnosis not present

## 2021-06-30 MED ORDER — FUROSEMIDE 40 MG PO TABS: 40.0000 mg | ORAL_TABLET | Freq: Every day | ORAL | 3 refills | Status: DC

## 2021-06-30 MED ORDER — METOPROLOL SUCCINATE ER 100 MG PO TB24: 100.0000 mg | ORAL_TABLET | Freq: Every day | ORAL | 3 refills | Status: DC

## 2021-06-30 MED ORDER — DAPAGLIFLOZIN PROPANEDIOL 10 MG PO TABS: 10.0000 mg | ORAL_TABLET | Freq: Every day | ORAL | 3 refills | Status: DC

## 2021-06-30 MED ORDER — SPIRONOLACTONE 25 MG PO TABS: 25.0000 mg | ORAL_TABLET | Freq: Every day | ORAL | 3 refills | Status: DC

## 2021-06-30 MED ORDER — LOSARTAN POTASSIUM 25 MG PO TABS: 25.0000 mg | ORAL_TABLET | Freq: Every day | ORAL | 3 refills | Status: DC

## 2021-06-30 NOTE — Patient Instructions (Addendum)
Medication Instructions:  Your physician recommends that you continue on your current medications as directed. Please refer to the Current Medication list given to you today.  *If you need a refill on your cardiac medications before your next appointment, please call your pharmacy*   Lab Work: Your physician recommends that you return for lab work today BMET  If you have labs (blood work) drawn today and your tests are completely normal, you will receive your results only by: MyChart Message (if you have MyChart) OR A paper copy in the mail If you have any lab test that is abnormal or we need to change your treatment, we will call you to review the results.   Testing/Procedures: NONE ordered at this time of appointment     Follow-Up: At [facility], you and your health needs are our priority.  As part of our continuing mission to provide you with exceptional heart care, we have created designated Provider Care Teams.  These Care Teams include your primary Cardiologist (physician) and Advanced Practice Providers (APPs -  Physician Assistants and Nurse Practitioners) who all work together to provide you with the care you need, when you need it.  We recommend signing up for the patient portal called "MyChart".  Sign up information is provided on this After Visit Summary.  MyChart is used to connect with patients for Virtual Visits (Telemedicine).  Patients are able to view lab/test results, encounter notes, upcoming appointments, etc.  Non-urgent messages can be sent to your provider as well.   To learn more about what you can do with MyChart, go to [email or web address].    Your next appointment:   3 month(s)  The format for your next appointment:   In Person  Provider:   Almyra Deforest, PA-C        Other Instructions Follow up with pharm D in 1-2 weeks

## 2021-06-30 NOTE — Progress Notes (Addendum)
Office Visit    Patient Name: Nicole Gibbs Date of Encounter: 06/30/2021  Primary Care Provider:  [facility], [place] Primary Cardiologist:  Quay Burow, MD  Chief Complaint    76 year old female with a history of chronic combined systolic and diastolic heart failure, nonischemic cardiomyopathy, hypertension, a not on CPAP, DVT/PE chronic DOAC therapy, anxiety, depression, arthritis, hypothyroidism, and obesity who presents for follow-up related to heart failure.  Past Medical History    Past Medical History:  Diagnosis Date   Anxiety    Arthritis    knees   Chronic combined systolic and diastolic CHF (congestive heart failure) ([place])    a. 07/2012 Echo: EF 55-60%, Gr1 DD;  b. 06/2015 Echo: EF 35-40%, triv AI, Mod MR, mod dil LA, mildly reduced RV.   Depression    DVT, bilateral lower limbs ([place])    H/O cardiovascular stress test    a. 07/2012 MV: fixed mid-dist anterior and apical defect - scar vs attenuation, distal ant HK, no reversibility, EF 51%-->low risk.   Hx of blood clots    Hypertensive heart disease    Hypothyroidism    Joint pain    Nocturia    Normal coronary arteries    by cardiac catheterization 09/03/15   Obese    OSA on CPAP    Postmenopausal vaginal bleeding    Pulmonary embolism, bilateral (HCC)    a. chronic coumadin.   Rash    under breasts   Skin cancer    Sleep apnea    SOB (shortness of breath)    Swelling of both lower extremities    Thyroid nodule    no meds   Past Surgical History:  Procedure Laterality Date   CARDIAC CATHETERIZATION N/A 09/03/2015   Procedure: Left Heart Cath and Coronary Angiography;  Surgeon: [name], MD;  Location: [facility];  Service: Cardiovascular;  Laterality: N/A;   CARDIOVASCULAR STRESS TEST  08-07-2012  DR HILTY/ DR BERRY   LOW RISK NUCLEAR STUDY/ NO ISCHEMIA/ FIXED MID TO DISTAL ANTERIOR AND APICAL DEFECT MAY REPRESENT SCAR VS BREAST ATTENUATION ARTIFACT/  MILD DISTAL ANTERIOR  HYPOKINESIS/ EF 51%   CESAREAN SECTION  1992   COLONOSCOPY N/A 05/08/2015   Procedure: COLONOSCOPY;  Surgeon: Laurence Spates, MD;  Location: [facility];  Service: Endoscopy;  Laterality: N/A;   DILATION AND CURETTAGE OF UTERUS  06/20/2012   Procedure: DILATATION AND CURETTAGE;  Surgeon: [name], MD;  Location: [facility];  Service: Gynecology;  Laterality: N/A;   dilation and evacution  1988   missed ab   [place] N/A 09/04/2012   Procedure: DILATATION & CURETTAGE WITH THERMACHOICE ABLATION;  Surgeon: [name], MD;  Location: [facility];  Service: Gynecology;  Laterality: N/A;   ESOPHAGOGASTRODUODENOSCOPY N/A 05/07/2015   Procedure: ESOPHAGOGASTRODUODENOSCOPY (EGD);  Surgeon: Laurence Spates, MD;  [facility];  Service: Endoscopy;  Laterality: N/A;   HYSTEROSCOPY WITH D & C  03-24-2009   TRANSTHORACIC ECHOCARDIOGRAM  08-08-2012   LVSF NORMAL/ EF 16-10%/  GRADE I DIASTOLIC DYSFUNCTION/ MILD AV AND MV REGURG./  RVSF MILDLY REDUCED    Allergies  Allergies  Allergen Reactions   Keflex [Cephalexin] Diarrhea   Sulfa Antibiotics Hives    History of Present Illness    76 year old female with the above past medical history including chronic combined systolic and diastolic heart failure, nonischemic cardiomyopathy, hypertension, OSA not on CPAP, DVT/PE chronic Coumadin therapy, anxiety, depression, arthritis, hypothyroidism,  and obesity.  She was initially evaluated by Dr. Alvester Chou in 2014 in the setting of shortness of breath was found to have bilateral DVTs and multiple pulmonary emboli, on chronic Coumadin therapy.  Echocardiogram at the time showed 84%, grade 1 diastolic dysfunction, mildly reduced RV systolic function.  She continued to have shortness of breath.  Echo in February 2017 showed EF 35 to 40%, moderate MR.  Myoview in March 2017 showed large fixed anteroseptal, apical, and inferolateral  perfusion defects suggestive of scar with no reversible ischemia. She ended up undergoing cardiac catheterization in 08/2015 which showed normal coronaries.  Echocardiogram in January 2021 showed improved EF, 55%, grade 1 DD, trivial MR.  She was admitted to the hospital in July 2022 with dyspnea on exertion in the setting of acute on chronic combined systolic and diastolic heart failure.  There was initially concern for possible atrial fibrillation, however, under further review, EKG was felt to show sinus tachycardia, PACs and PVCs.  Echocardiogram at the time showed EF 25 to 30%, global hypokinesis, grade 2 DD, RVSP 39.4 mmHg, mild to moderate LAE, mild MR, mild AI. She was last seen in the office on 01/20/2021 and was stable overall from a cardiac standpoint she did report nonadherence to medication regimen. Per phone conversations with LCSW in December 2022 she reported ongoing difficulties with medication adherence in the setting of cost restraint. According to the notes she does not qualify for patient assistance for [place] must meet a deductible after which her co-pay would be $40 a month.  She presents today for follow-up. Since her last visit she has been stable overall from a cardiac standpoint.  Stable chronic dyspnea on exertion, stable mild bilateral lower extremity edema.  This recently started taking her Lasix daily as prescribed.  She has not been taking her losartan she states she was not aware that she was supposed to take this medication.  She denies any symptoms concerning for angina.  She does report occasional mild dizziness when laying on her right side when lying down in bed at night.  She is concerned about the cost of [place] going forward.  Otherwise, she denies any specific concerns or complaints today.  Home Medications    Current Outpatient Medications  Medication Sig Dispense Refill   acetaminophen (TYLENOL) 325 MG tablet Take 650 mg by mouth every 6 (six) hours as needed for  moderate pain.     benzonatate (TESSALON) 200 MG capsule Take 1 capsule (200 mg total) by mouth 3 (three) times daily as needed for cough. 20 capsule 0   MELATONIN PO Take by mouth at bedtime as needed.     methimazole (TAPAZOLE) 5 MG tablet Take 5 mg by mouth daily.     nitroGLYCERIN (NITROSTAT) 0.4 MG SL tablet Place 1 tablet (0.4 mg total) under the tongue every 5 (five) minutes as needed for chest pain. 25 tablet 3   nystatin cream (MYCOSTATIN) Apply 1 application topically 2 (two) times daily. 30 g 0   progesterone (PROMETRIUM) 200 MG capsule Take 200 mg by mouth at bedtime.      warfarin (COUMADIN) 3 MG tablet Take 1/2 a tablet to 1 tablet by mouth daily as directed by the coumadin clinic 75 tablet 0   zolpidem (AMBIEN) 5 MG tablet 1 tablet at bedtime.     dapagliflozin propanediol (FARXIGA) 10 MG TABS tablet Take 1 tablet (10 mg total) by mouth daily before breakfast. 90 tablet 3   furosemide (LASIX) 40 MG tablet Take  1 tablet (40 mg total) by mouth daily. 90 tablet 3   losartan (COZAAR) 25 MG tablet Take 1 tablet (25 mg total) by mouth daily. 90 tablet 3   metoprolol succinate (TOPROL-XL) 100 MG 24 hr tablet Take 1 tablet (100 mg total) by mouth daily. Take with or immediately following a meal. 90 tablet 3   spironolactone (ALDACTONE) 25 MG tablet Take 1 tablet (25 mg total) by mouth daily. 90 tablet 3   No current facility-administered medications for this visit.     Review of Systems    She denies chest pain, palpitations, pnd, orthopnea, n, v, syncope,  weight gain, or early satiety. All other systems reviewed and are otherwise negative except as noted above.   Physical Exam    VS:  BP 140/72    Pulse (!) 47    Ht 5\' 1"  (1.549 m)    Wt 216 lb (98 kg)    SpO2 96%    BMI 40.81 kg/m   GEN: Well nourished, well developed, in no acute distress. HEENT: normal. Neck: Supple, no JVD, carotid bruits, or masses. Cardiac: RRR, no murmurs, rubs, or gallops. No clubbing, cyanosis, edema.   Radials/DP/PT 2+ and equal bilaterally.  Respiratory:  Respirations regular and unlabored, clear to auscultation bilaterally. GI: Soft, nontender, nondistended, BS + x 4. MS: no deformity or atrophy. Skin: warm and dry, no rash. Neuro:  Strength and sensation are intact. Psych: Normal affect.  Accessory Clinical Findings    ECG personally reviewed by me today - Sinus rhythm with frequent PVCs, LVH, - no acute changes.  Lab Results  Component Value Date   WBC 12.1 (H) 11/30/2020   HGB 14.4 11/30/2020   HCT 45.8 11/30/2020   MCV 91.1 11/30/2020   PLT 284 11/30/2020   Lab Results  Component Value Date   CREATININE 0.79 03/05/2021   BUN 18 03/05/2021   NA 142 03/05/2021   K 4.9 03/05/2021   CL 106 03/05/2021   CO2 24 03/05/2021   Lab Results  Component Value Date   ALT 14 09/28/2020   AST 21 09/28/2020   ALKPHOS 75 09/28/2020   BILITOT 0.7 09/28/2020   Lab Results  Component Value Date   CHOL 154 12/16/2019   HDL 52 12/16/2019   LDLCALC 84 12/16/2019   TRIG 90 12/16/2019    Lab Results  Component Value Date   HGBA1C 5.8 (H) 09/28/2020    Assessment & Plan    1. Chronic combined systolic and diastolic heart failure/NICM: Most recent echo in 11/2020 showed EF 25 to 30%, global hypokinesis, grade 2 DD, RVSP 39.4 mmHg, mild to moderate LAE, mild MR, mild AI.  Reports chronic stable dyspnea on exertion, mild nonpitting bilateral lower extremity edema. Overall, euvolemic and well compensated on exam.She continues to have problems with medication adherence. She has not been taking her losartan and just recently started taking her Lasix daily.  She is concerned about cost of [place] going forward. She states she has 1 more month of samples. Discussed with LCSW today, she does not qualify for assistance. She must meet her deductible prior to reaching monthly co-pay of $40 a month, patient is aware.  Recommend 2 week follow-up with Pharm.D for ongoing recommendations and support  with medication adherence. Continue losartan, metoprolol, Farxiga, Lasix, and spironolactone.  She thinks she had labs done recently at her PCP, however, she is not sure what was drawn, we do not have this on file.  Will check BMET today. Consider  repeat echocardiogram in 3 months following uninterrupted GDMT.  2. Hypertension: BP well controlled overall.  Recommended home BP monitoring.  For now, continue current antihypertensive regimen.   3. H/o DVT/PE:  Denies chest pain, dyspnea, lower extremity redness, pain, new swelling.  Stable. On chronic Coumadin, continue same.   4. Disposition: Follow-up in 3 months.  Lenna Sciara, NP 06/30/2021, 4:27 PM

## 2021-06-30 NOTE — Progress Notes (Signed)
Received f/u from Raquel Sarna, NP, during pt visit today.  Pt has continued challenges w/ costs of [place]. Unfortunately, per my conversations w/ pt at the end of 2022, pt income is over limits for manufacturer's PAP between her SSI and her late husband's pension. She does not qualify for [place] either and after speaking with a Medical sales representative at [employer] she was informed that there is not another Part D plan that would provide more coverage for those medications. Pt does have Part D prescription insurance and needs to meet her OOP for her plan and then the costs should decrease for these medications. B/c she has utilized samples often she has not been meeting her OOP. I have sent conversation to [name], PharmD, who is familiar with pt also to confirm.    Westley Hummer, MSW, [place]  [phone]- work cell phone (preferred) [phone]- desk phone

## 2021-07-01 ENCOUNTER — Encounter: Payer: Self-pay | Admitting: *Deleted

## 2021-07-01 LAB — BASIC METABOLIC PANEL
BUN/Creatinine Ratio: 17 (ref 12–28)
BUN: 14 mg/dL (ref 8–27)
CO2: 21 mmol/L (ref 20–29)
Calcium: 9.1 mg/dL (ref 8.7–10.3)
Chloride: 103 mmol/L (ref 96–106)
Creatinine, Ser: 0.83 mg/dL (ref 0.57–1.00)
Glucose: 104 mg/dL — ABNORMAL HIGH (ref 70–99)
Potassium: 3.9 mmol/L (ref 3.5–5.2)
Sodium: 141 mmol/L (ref 134–144)
eGFR: 74 mL/min/{1.73_m2} (ref >59)

## 2021-07-21 ENCOUNTER — Ambulatory Visit (INDEPENDENT_AMBULATORY_CARE_PROVIDER_SITE_OTHER): Payer: Medicare Other

## 2021-07-21 ENCOUNTER — Other Ambulatory Visit: Payer: Self-pay

## 2021-07-21 DIAGNOSIS — Z7901 Long term (current) use of anticoagulants: Secondary | ICD-10-CM

## 2021-07-21 DIAGNOSIS — I82403 Acute embolism and thrombosis of unspecified deep veins of lower extremity, bilateral: Secondary | ICD-10-CM | POA: Diagnosis not present

## 2021-07-21 LAB — POCT INR: INR: 1.9 — AB (ref 2.0–3.0)

## 2021-07-21 MED ORDER — WARFARIN SODIUM 3 MG PO TABS: ORAL_TABLET | ORAL | 1 refills | Status: DC

## 2021-07-21 NOTE — Patient Instructions (Signed)
TAKE 2 TABLETS TONIGHT and then continue 1 tablet daily. Recheck INR in 2 weeks. Coumadin Clinic [phone].  ?

## 2021-07-28 DIAGNOSIS — Z20828 Contact with and (suspected) exposure to other viral communicable diseases: Secondary | ICD-10-CM | POA: Diagnosis not present

## 2021-07-30 ENCOUNTER — Other Ambulatory Visit: Payer: Self-pay

## 2021-07-30 ENCOUNTER — Ambulatory Visit (INDEPENDENT_AMBULATORY_CARE_PROVIDER_SITE_OTHER): Payer: Medicare Other | Admitting: Pharmacist

## 2021-07-30 ENCOUNTER — Ambulatory Visit (INDEPENDENT_AMBULATORY_CARE_PROVIDER_SITE_OTHER): Payer: Medicare Other

## 2021-07-30 VITALS — BP 134/80 | HR 76 | Resp 15 | Ht 61.0 in | Wt 219.0 lb

## 2021-07-30 DIAGNOSIS — I5042 Chronic combined systolic (congestive) and diastolic (congestive) heart failure: Secondary | ICD-10-CM | POA: Diagnosis not present

## 2021-07-30 DIAGNOSIS — Z7901 Long term (current) use of anticoagulants: Secondary | ICD-10-CM

## 2021-07-30 DIAGNOSIS — I82403 Acute embolism and thrombosis of unspecified deep veins of lower extremity, bilateral: Secondary | ICD-10-CM | POA: Diagnosis not present

## 2021-07-30 LAB — POCT INR: INR: 1.9 — AB (ref 2.0–3.0)

## 2021-07-30 NOTE — Patient Instructions (Addendum)
It was good seeing you today ? ?I would like you to take : ?Spironolactone '25mg'$  once a day ?Jardiance '10mg'$  once a day ?Metoprolol '100mg'$  once a day ?Entresto 24-'26mg'$  twice a day ? ?Continue your Furosemide '40mg'$  daily ?If you gain more than 3 pounds overnight, I would like you to take 1 and 1/2 tablets ('60mg'$ ) ? ?We will call your pharmacy to see if your plan is active ? ?Please call with any questions! ? ?Karren Cobble, PharmD, BCACP, [place], CPP ?[address], [address] ?[place], [place], [address] ?Phone: [phone], Fax: [phone]  ?

## 2021-07-30 NOTE — Progress Notes (Signed)
Patient ID: Nicole Gibbs                 DOB: 05-06-1946                      MRN: [id number] ? ? ? ? ?HPI: ?Nicole Gibbs is a 76 y.o. female referred by Diona Browner to pharmacy clinic for HF medication management. PMH is significant for DVT, PE, HTN, CHF, pre DM, and OSA.. Most recent LVEF 25-30% on 11/25/20. Has been struggling with costs of medications. ? ?Today she returns to pharmacy clinic for further medication titration and reconciliation. At last visit with Diona Browner patient reported confusion with medication regimen and issues with costs of Farxiga.  Is unclear what her current medicare coverage is and was denied patient assistance for [place]. Symptomatically, she is feeling well. Denies dizziness, lightheadedness, and fatigue. Denies chest pain or palpitations. ? ?She has significant lower extremity edema on both legs.  Feels SOB during exertion. . Able to complete all ADLs. Activity level very low. She does not check her weight at home. Does not own a scale. Does not check BP at home. ? ?Current CHF meds:  ?Spironolactone '25mg'$  daily ?Toprol '100mg'$  daily ?Losartan '25mg'$  daily ?Farxiga '10mg'$  daily ?Furosemide '40mg'$  daily ? ?BP goal: <130/80 ? ? ?Wt Readings from Last 3 Encounters:  ?06/30/21 216 lb (98 kg)  ?03/30/21 216 lb (98 kg)  ?02/10/21 213 lb 9.6 oz (96.9 kg)  ? ?BP Readings from Last 3 Encounters:  ?06/30/21 140/72  ?03/30/21 (!) 122/54  ?02/10/21 125/60  ? ?Pulse Readings from Last 3 Encounters:  ?06/30/21 (!) 47  ?03/30/21 66  ?02/10/21 64  ? ? ?Renal function: ?CrCl cannot be calculated (Patient's most recent lab result is older than the maximum 21 days allowed.). ? ?Past Medical History:  ?Diagnosis Date  ? Anxiety   ? Arthritis   ? knees  ? Chronic combined systolic and diastolic CHF (congestive heart failure) ([place])   ? a. 07/2012 Echo: EF 55-60%, Gr1 DD;  b. 06/2015 Echo: EF 35-40%, triv AI, Mod MR, mod dil LA, mildly reduced RV.  ? Depression   ? DVT, bilateral lower limbs ([place])   ? H/O  cardiovascular stress test   ? a. 07/2012 MV: fixed mid-dist anterior and apical defect - scar vs attenuation, distal ant HK, no reversibility, EF 51%-->low risk.  ? Hx of blood clots   ? Hypertensive heart disease   ? Hypothyroidism   ? Joint pain   ? Nocturia   ? Normal coronary arteries   ? by cardiac catheterization 09/03/15  ? Obese   ? OSA on CPAP   ? Postmenopausal vaginal bleeding   ? Pulmonary embolism, bilateral ([place])   ? a. chronic coumadin.  ? Rash   ? under breasts  ? Skin cancer   ? Sleep apnea   ? SOB (shortness of breath)   ? Swelling of both lower extremities   ? Thyroid nodule   ? no meds  ? ? ?Current Outpatient Medications on File Prior to Visit  ?Medication Sig Dispense Refill  ? acetaminophen (TYLENOL) 325 MG tablet Take 650 mg by mouth every 6 (six) hours as needed for moderate pain.    ? benzonatate (TESSALON) 200 MG capsule Take 1 capsule (200 mg total) by mouth 3 (three) times daily as needed for cough. 20 capsule 0  ? dapagliflozin propanediol (FARXIGA) 10 MG TABS tablet Take 1 tablet (10 mg total) by mouth  daily before breakfast. 90 tablet 3  ? furosemide (LASIX) 40 MG tablet Take 1 tablet (40 mg total) by mouth daily. 90 tablet 3  ? losartan (COZAAR) 25 MG tablet Take 1 tablet (25 mg total) by mouth daily. 90 tablet 3  ? MELATONIN PO Take by mouth at bedtime as needed.    ? methimazole (TAPAZOLE) 5 MG tablet Take 5 mg by mouth daily.    ? metoprolol succinate (TOPROL-XL) 100 MG 24 hr tablet Take 1 tablet (100 mg total) by mouth daily. Take with or immediately following a meal. 90 tablet 3  ? nitroGLYCERIN (NITROSTAT) 0.4 MG SL tablet Place 1 tablet (0.4 mg total) under the tongue every 5 (five) minutes as needed for chest pain. 25 tablet 3  ? nystatin cream (MYCOSTATIN) Apply 1 application topically 2 (two) times daily. 30 g 0  ? progesterone (PROMETRIUM) 200 MG capsule Take 200 mg by mouth at bedtime.     ? spironolactone (ALDACTONE) 25 MG tablet Take 1 tablet (25 mg total) by mouth  daily. 90 tablet 3  ? warfarin (COUMADIN) 3 MG tablet Take 1-2 tablets by mouth daily or as directed by the coumadin clinic 90 tablet 1  ? zolpidem (AMBIEN) 5 MG tablet 1 tablet at bedtime.    ? ?No current facility-administered medications on file prior to visit.  ? ? ?Allergies  ?Allergen Reactions  ? Keflex [Cephalexin] Diarrhea  ? Sulfa Antibiotics Hives  ? ? ? ?Assessment/Plan: ? ?1. CHF -  Patient BP in room 134/80 which is slightly above goal of <130/80. Patient has significant LEE and SOB upon exertion. Struggling with med costs and confusion regarding what she should be taking. ? ?Patient has bottle of Entresto from outpatient pharmacy that she has not been taking. Recently restarted losartan after las visit with Diona Browner since she did not realize she was supposed to be taking this. Advised she should not be taking losartanand Entresto together and recommend she start taking Entresto BID.  Wrote directions on label. Denied patient assistance of [place], has not attempted to apply yet for Jaardiance.  Reports she thinks she felt better on Jardiance but could not afford the copay. Will give samples and printed out patient assistance application. ? ?A medicare part D plan is on patient's file however she does not know if it is active or not. Attempted to call pharmacy to verify benefits but no answer. Will attempt again next week. ? ?Recommended patient purchase scale and begin weighing herself at home. Advised if she gains more than 3# overnight, to take 1.5 tabets of Lasix for '60mg'$ . Patient voiced understanding. ? ?Continue: ?Spironolactone '25mg'$  once a day ?Jardiance '10mg'$  once a day ?Metoprolol '100mg'$  once a day ?Entresto 24-'26mg'$  twice a day ?Furosemide '40mg'$  daily ?Will call pharmacy to verify benefits ? ?Karren Cobble, PharmD, BCACP, [place], CPP ?[address], [address] ?[place], [place], [address] ?Phone: [phone], Fax: [phone]  ?

## 2021-07-30 NOTE — Patient Instructions (Signed)
TAKE 2 TABLETS TONIGHT and then increase to 1 tablet daily, except Wednesday 1.5 tablets. Recheck INR in 5 weeks. Coumadin Clinic [phone].  ?

## 2021-08-01 ENCOUNTER — Encounter: Payer: Self-pay | Admitting: Pharmacist

## 2021-08-01 MED ORDER — EMPAGLIFLOZIN 10 MG PO TABS: 10.0000 mg | ORAL_TABLET | Freq: Every day | ORAL | 0 refills | Status: DC

## 2021-08-02 ENCOUNTER — Telehealth: Payer: Self-pay

## 2021-08-02 NOTE — Telephone Encounter (Signed)
Routing to dr. Evelene Croon as he requested this information. ?Called the pharmacy to obtain rx insurance information is as follows: ?Id: [id number] ?[place]: [id number] ?Pcn: [id number] ?Grp: [id number] ?

## 2021-08-02 NOTE — Telephone Encounter (Signed)
-----   Message from Rollen Sox, [facility] sent at 07/30/2021  5:01 PM EDT ----- ?Please call pharmacy again to verify insurance ? ?

## 2021-08-05 ENCOUNTER — Other Ambulatory Visit: Payer: Self-pay | Admitting: Cardiovascular Disease

## 2021-08-10 DIAGNOSIS — Z20822 Contact with and (suspected) exposure to covid-19: Secondary | ICD-10-CM | POA: Diagnosis not present

## 2021-08-17 NOTE — Patient Outreach (Signed)
[facility]) Care Management ? ?08/17/2021 ? ?Nicole Gibbs ?07/10/1945 ?[id number] ? ? ?Received referral for Care Management from Insurance plan. Assigned patient to Joellyn Quails, RN Care Coordinator for follow up. ? ? ?Nicole Gibbs ?[place] Management Assistant ?[phone] ? ?

## 2021-08-24 ENCOUNTER — Other Ambulatory Visit: Payer: Self-pay | Admitting: *Deleted

## 2021-08-24 NOTE — Patient Outreach (Signed)
[facility]) Care Management ? ?08/24/2021 ? ?Nicole Gibbs ?[birth year] ?[id number] ? ? ?[place] coordination with case closure  ? ?Nicole Gibbs was referred to [facility] for screening for [facility] RN CM services but has an external CM vendor ? ?Plan case closure- external CM vendor  ? ? ?Nicole Gibbs L. Lavina Hamman, RN, BSN, CCM ?[place] Management Care Coordinator ?Office number [phone] ?

## 2021-08-25 ENCOUNTER — Other Ambulatory Visit: Payer: Self-pay | Admitting: Student

## 2021-08-26 ENCOUNTER — Other Ambulatory Visit: Payer: Self-pay | Admitting: Student

## 2021-08-27 ENCOUNTER — Other Ambulatory Visit: Payer: Self-pay | Admitting: Cardiovascular Disease

## 2021-08-30 ENCOUNTER — Ambulatory Visit (INDEPENDENT_AMBULATORY_CARE_PROVIDER_SITE_OTHER): Payer: Medicare Other

## 2021-08-30 ENCOUNTER — Other Ambulatory Visit: Payer: Self-pay

## 2021-08-30 ENCOUNTER — Telehealth: Payer: Self-pay | Admitting: Cardiovascular Disease

## 2021-08-30 DIAGNOSIS — Z7901 Long term (current) use of anticoagulants: Secondary | ICD-10-CM | POA: Diagnosis not present

## 2021-08-30 DIAGNOSIS — I82403 Acute embolism and thrombosis of unspecified deep veins of lower extremity, bilateral: Secondary | ICD-10-CM | POA: Diagnosis not present

## 2021-08-30 LAB — POCT INR: INR: 2.4 (ref 2.0–3.0)

## 2021-08-30 MED ORDER — WARFARIN SODIUM 3 MG PO TABS: ORAL_TABLET | ORAL | 1 refills | Status: DC

## 2021-08-30 NOTE — Patient Instructions (Signed)
Continue 1 tablet daily, except Wednesday 1.5 tablets. Recheck INR in 6 weeks. Coumadin Clinic [phone].  ?

## 2021-08-30 NOTE — Telephone Encounter (Signed)
? ?  Patient calling the office for samples of medication: ? ? ?1.  What medication and dosage are you requesting samples for?  ? empagliflozin (JARDIANCE) 10 MG TABS tablet  ? ? ?2.  Are you currently out of this medication? 1 pill left ? ? ?Pt said, she went to [facility] and tried to pick up her Vania Rea and it will cost her $600.88 for 90 days supply. She will go find a different pharmacy to see where she can get it cheaper, for the mean time she request if there's sample she can get since she only have 1 pill left and she is going to the office today for her coumadin, she can pick it up while she is there.   ?

## 2021-08-30 NOTE — Telephone Encounter (Signed)
Patient wants Jardiance 10 mg samples. She is searching pharmacies to get the best price. Three sample bottles set aside for patient. ?

## 2021-09-02 DIAGNOSIS — Z20822 Contact with and (suspected) exposure to covid-19: Secondary | ICD-10-CM | POA: Diagnosis not present

## 2021-09-13 ENCOUNTER — Encounter: Payer: Self-pay | Admitting: Physician Assistant

## 2021-09-13 ENCOUNTER — Ambulatory Visit (INDEPENDENT_AMBULATORY_CARE_PROVIDER_SITE_OTHER): Payer: Medicare Other | Admitting: Physician Assistant

## 2021-09-13 VITALS — BP 142/76 | HR 77 | Ht 61.0 in | Wt 218.2 lb

## 2021-09-13 DIAGNOSIS — I428 Other cardiomyopathies: Secondary | ICD-10-CM

## 2021-09-13 DIAGNOSIS — E785 Hyperlipidemia, unspecified: Secondary | ICD-10-CM | POA: Diagnosis not present

## 2021-09-13 DIAGNOSIS — I5022 Chronic systolic (congestive) heart failure: Secondary | ICD-10-CM | POA: Diagnosis not present

## 2021-09-13 DIAGNOSIS — I10 Essential (primary) hypertension: Secondary | ICD-10-CM | POA: Diagnosis not present

## 2021-09-13 DIAGNOSIS — I82403 Acute embolism and thrombosis of unspecified deep veins of lower extremity, bilateral: Secondary | ICD-10-CM

## 2021-09-13 MED ORDER — LOSARTAN POTASSIUM 50 MG PO TABS: 50.0000 mg | ORAL_TABLET | Freq: Every day | ORAL | 1 refills | Status: DC

## 2021-09-13 NOTE — Progress Notes (Signed)
?Cardiology Office Note:   ? ?Date:  09/15/2021  ? ?ID:  Nicole Gibbs, DOB 28-Jun-1945, MRN [id number] ? ?PCP:  Nicole Commons, FNP ?  ?[facility] Providers ?Cardiologist:  Nicole Burow, MD    ? ?Referring MD: Nicole Commons, FNP  ? ?Chief Complaint  ?Patient presents with  ? Follow-up  ?  Seen for Dr. Gwenlyn Gibbs  ? ? ?History of Present Illness:   ? ?Nicole Gibbs is a 76 y.o. 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  Patient was previously referred to Dr. Gwenlyn Gibbs in March 2014 for preoperative evaluation and shortness of breath.  Work-up revealed bilateral DVT and multiple PEs.  She was admitted and placed on Lovenox with Coumadin therapy.  Echocardiogram at the time showed normal EF, mild RV is dysfunction.  Follow-up CTA showed complete resolution of PE however did have incidental noted multinodular goiter.  Lower extremity Doppler also revealed resolution of DVT.  Repeat echocardiogram shows EF 35 to 40% with moderate MR in February 2017.  Myoview in March 2017 showed large fixed anteroseptal, apical and inferolateral perfusion defect suggestive of scar and no reversible ischemia.  Cardiac catheterization in April 2017 however revealed normal coronary arteries.  EF improved to 55% by echo in January 2021. ?  ?Patient was admitted to the hospital in July 2022 for shortness of breath with exertion.  BNP was elevated at 1930.  Chest x-ray showed central venous congestion and cardiomegaly but no overt edema.  Hemoglobin was normal.  Respiratory panel negative for COVID.  Patient was treated with IV Lasix and admitted for acute on chronic CHF.  Although there was some concern of possible atrial fibrillation, EKG was reviewed by cardiology service and felt to be sinus tachycardia with frequent PACs and PVCs.  Repeat echocardiogram obtained on  11/25/2020 showed EF 25 to 30%, global hypokinesis, grade 2 DD, RVSP 39.4 mmHg, mild to moderate LAE, mild MR, mild AI.  Metoprolol succinate was increased to 100 mg daily for better PVC suppression.  TSH well-controlled.  She is on Entresto and spironolactone as well.  She was also placed on Jardiance.  She was ultimately discharged on 40 mg daily of Lasix.  Discharge weight 219 pounds. ? ?I last saw the patient on 02/02/2021 at which time she has stopped taking all of her cardiac medication, I ended up restarting most of her medication.  She was last seen by Nicole Browner NP on 06/30/2021 at which time she was stable from the cardiac perspective.  She has been having financial issues and unable to afford Jardiance.  She was told the initial prescription will cost about $500, afterward it will be roughly around $40 per month.  Despite so, she says she has trouble coming up with a $500 for the initial amount.  For the time being, I will hold off on trying to start her on the Jardiance.  Blood pressure is elevated today, I will increase losartan to 50 mg daily.  In 15-month she is due for CBC, CMP and fasting lipid panel.  We also plan for repeat echocardiogram in August, if ejection fraction is still low, that will give uKoreaa reason to be more aggressive with her medication and [facility] either with Farxiga/Jardiance again ? ?Past Medical History:  ?Diagnosis Date  ? Anxiety   ? Arthritis   ?  knees  ? Chronic combined systolic and diastolic CHF (congestive heart failure) ([place])   ? a. 07/2012 Echo: EF 55-60%, Gr1 DD;  b. 06/2015 Echo: EF 35-40%, triv AI, Mod MR, mod dil LA, mildly reduced RV.  ? Depression   ? DVT, bilateral lower limbs (Nicole Gibbs)   ? H/O cardiovascular stress test   ? a. 07/2012 MV: fixed mid-dist anterior and apical defect - scar vs attenuation, distal ant HK, no reversibility, EF 51%-->low risk.  ? Hx of blood clots   ? Hypertensive heart disease   ?  Hypothyroidism   ? Joint pain   ? Nocturia   ? Normal coronary arteries   ? by cardiac catheterization 09/03/15  ? Obese   ? OSA on CPAP   ? Postmenopausal vaginal bleeding   ? Pulmonary embolism, bilateral (Nicole Gibbs)   ? a. chronic coumadin.  ? Rash   ? under breasts  ? Skin cancer   ? Sleep apnea   ? SOB (shortness of breath)   ? Swelling of both lower extremities   ? Thyroid nodule   ? no meds  ? ? ?Past Surgical History:  ?Procedure Laterality Date  ? CARDIAC CATHETERIZATION N/A 09/03/2015  ? Procedure: Left Heart Cath and Coronary Angiography;  Surgeon: [name], MD;  Location: [facility];  Service: Cardiovascular;  Laterality: N/A;  ? CARDIOVASCULAR STRESS TEST  08-07-2012  DR HILTY/ DR BERRY  ? LOW RISK NUCLEAR STUDY/ NO ISCHEMIA/ FIXED MID TO DISTAL ANTERIOR AND APICAL DEFECT MAY REPRESENT SCAR VS BREAST ATTENUATION ARTIFACT/  MILD DISTAL ANTERIOR HYPOKINESIS/ EF 51%  ? Nicole Gibbs  ? COLONOSCOPY N/A 05/08/2015  ? Procedure: COLONOSCOPY;  Surgeon: Laurence Spates, MD;  Location: [facility] ENDOSCOPY;  Service: Endoscopy;  Laterality: N/A;  ? DILATION AND CURETTAGE OF UTERUS  06/20/2012  ? Procedure: DILATATION AND CURETTAGE;  Surgeon: [name], MD;  Location: [facility];  Service: Gynecology;  Laterality: N/A;  ? dilation and evacution  1988  ? missed ab  ? DILITATION & CURRETTAGE/HYSTROSCOPY WITH THERMACHOICE ABLATION N/A 09/04/2012  ? Procedure: DILATATION & CURETTAGE WITH THERMACHOICE ABLATION;  Surgeon: [name], MD;  Location: [facility];  Service: Gynecology;  Laterality: N/A;  ? ESOPHAGOGASTRODUODENOSCOPY N/A 05/07/2015  ? Procedure: ESOPHAGOGASTRODUODENOSCOPY (EGD);  Surgeon: Laurence Spates, MD;  Location: [facility] ENDOSCOPY;  Service: Endoscopy;  Laterality: N/A;  ? HYSTEROSCOPY WITH D & C  03-24-2009  ? TRANSTHORACIC ECHOCARDIOGRAM  08-08-2012  ? LVSF NORMAL/ EF 16-10%/  GRADE I DIASTOLIC DYSFUNCTION/ MILD AV AND MV REGURG./  RVSF MILDLY REDUCED  ? ? ?Current  Medications: ?Current Meds  ?Medication Sig  ? acetaminophen (TYLENOL) 325 MG tablet Take 650 mg by mouth every 6 (six) hours as needed for moderate pain.  ? empagliflozin (JARDIANCE) 10 MG TABS tablet Take 10 mg by mouth daily.  ? furosemide (LASIX) 40 MG tablet Take 1 tablet (40 mg total) by mouth daily.  ? MELATONIN PO Take by mouth at bedtime as needed.  ? methimazole (TAPAZOLE) 5 MG tablet Take 5 mg by mouth daily.  ? metoprolol succinate (TOPROL-XL) 100 MG 24 hr tablet Take 1 tablet (100 mg total) by mouth daily. Take with or immediately following a meal.  ? nystatin cream (MYCOSTATIN) Apply 1 application topically 2 (two) times daily.  ? progesterone (PROMETRIUM) 200 MG capsule Take 200 mg by mouth at bedtime.   ? spironolactone (ALDACTONE) 25 MG tablet Take 1 tablet (25 mg total) by mouth daily.  ?  warfarin (COUMADIN) 3 MG tablet TAKE 1-2 TABLETS BY MOUTH ONCE DAILY OR AS DIRECTED BY COUMADIN CLINIC  ? zolpidem (AMBIEN) 5 MG tablet 1 tablet at bedtime.  ? [DISCONTINUED] losartan (COZAAR) 25 MG tablet Take 1 tablet (25 mg total) by mouth daily.  ?  ? ?Allergies:   Keflex [cephalexin] and Sulfa antibiotics  ? ?Social History  ? ?Socioeconomic History  ? Marital status: Widowed  ?  Spouse name: Not on file  ? Number of children: Not on file  ? Years of education: Not on file  ? Highest education level: Not on file  ?Occupational History  ? Occupation: retired  ?Tobacco Use  ? Smoking status: Never  ? Smokeless tobacco: Never  ?Substance and Sexual Activity  ? Alcohol use: No  ? Drug use: No  ? Sexual activity: Not on file  ?Other Topics Concern  ? Not on file  ?Social History Narrative  ? Not on file  ? ?Social Determinants of Health  ? ?Financial Resource Strain: Low Risk   ? Difficulty of Paying Living Expenses: Not very hard  ?Food Insecurity: No Food Insecurity  ? Worried About Charity fundraiser in the Last Year: Never true  ? Ran Out of Food in the Last Year: Never true  ?Transportation Needs: No  Transportation Needs  ? Lack of Transportation (Medical): No  ? Lack of Transportation (Non-Medical): No  ?Physical Activity: Not on file  ?Stress: Not on file  ?Social Connections: Not on file  ?  ? ?Family History: ?The patient

## 2021-09-13 NOTE — Patient Instructions (Signed)
Medication Instructions:  ?INCREASE Losartan to 50 mg daily ? ?*If you need a refill on your cardiac medications before your next appointment, please call your pharmacy* ? ?Lab Work: ?Your physician recommends that you return for lab work in August 2023:  ?CBC ?CMP ?Fasting Lipid Panel-DO NOT EAT OR DRINK PAST MIDNIGHT. OKAY TO HAVE WATER. ? ?If you have labs (blood work) drawn today and your tests are completely normal, you will receive your results only by: ?MyChart Message (if you have MyChart) OR ?A paper copy in the mail ?If you have any lab test that is abnormal or we need to change your treatment, we will call you to review the results. ? ?Testing/Procedures: ?NONE ordered at this time of appointment  ? ?Follow-Up: ?At [facility], you and your health needs are our priority.  As part of our continuing mission to provide you with exceptional heart care, we have created designated Provider Care Teams.  These Care Teams include your primary Cardiologist (physician) and Advanced Practice Providers (APPs -  Physician Assistants and Nurse Practitioners) who all work together to provide you with the care you need, when you need it. ? ?We recommend signing up for the patient portal called "MyChart".  Sign up information is provided on this After Visit Summary.  MyChart is used to connect with patients for Virtual Visits (Telemedicine).  Patients are able to view lab/test results, encounter notes, upcoming appointments, etc.  Non-urgent messages can be sent to your provider as well.   ?To learn more about what you can do with MyChart, go to [email or web address].   ? ?Your next appointment:   ?4 month(s) ? ?The format for your next appointment:   ?In Person ? ?Provider:   ?Quay Burow, MD   ? ? ?Other Instructions ? ? ?Important Information About [place] ? ? ? ? ? ? ?

## 2021-09-15 ENCOUNTER — Encounter: Payer: Self-pay | Admitting: Physician Assistant

## 2021-09-20 DIAGNOSIS — Z20822 Contact with and (suspected) exposure to covid-19: Secondary | ICD-10-CM | POA: Diagnosis not present

## 2021-10-12 ENCOUNTER — Telehealth: Payer: Self-pay | Admitting: Cardiovascular Disease

## 2021-10-12 NOTE — Telephone Encounter (Signed)
Spoke to patient . She states  she spoke a disability agent  they recommend she ask her doctor to write a letter for her address to [facility].  Patient states her homeowner association has move her mailbox  to area that she cannot  get to without  going over to  sets of curbs.  Patient states  she is very leery  and unsteady  on her feet to obtain her mail.  She states she has not gotten mail for 3 weeks.     She states this were she had fallen  several years ago . She  states she is short of breath  and fallen the past.  Patient would like a letter from Dr [name] or Almyra Deforest PA for letter. Patient aware will defer to  Dr [name] or [place] PA

## 2021-10-12 NOTE — Telephone Encounter (Signed)
Patient called and mentioned that her mailbox was moved. Patient has two curbs to get over and have falls all the times

## 2021-10-15 ENCOUNTER — Ambulatory Visit (INDEPENDENT_AMBULATORY_CARE_PROVIDER_SITE_OTHER): Payer: Medicare Other

## 2021-10-15 DIAGNOSIS — Z7901 Long term (current) use of anticoagulants: Secondary | ICD-10-CM

## 2021-10-15 DIAGNOSIS — I82403 Acute embolism and thrombosis of unspecified deep veins of lower extremity, bilateral: Secondary | ICD-10-CM

## 2021-10-15 LAB — POCT INR: INR: 2.1 (ref 2.0–3.0)

## 2021-10-15 NOTE — Patient Instructions (Signed)
Continue 1 tablet daily, except Wednesday 1.5 tablets. Recheck INR in 6 weeks. Coumadin Clinic [phone].

## 2021-10-27 NOTE — Telephone Encounter (Signed)
Spoke with pt regarding letter stating that she does not feel safe getting to her mailbox. Pt states that she spoke with someone from the disability advocacy group and they told her she would need a letter from a provider. Pt would like for letter to be mailed to her address and she will take care of it from there. Pt verbalizes understanding.

## 2021-11-26 ENCOUNTER — Ambulatory Visit (INDEPENDENT_AMBULATORY_CARE_PROVIDER_SITE_OTHER): Payer: Medicare Other | Admitting: Pharmacist

## 2021-11-26 ENCOUNTER — Telehealth: Payer: Self-pay | Admitting: Cardiovascular Disease

## 2021-11-26 DIAGNOSIS — Z7901 Long term (current) use of anticoagulants: Secondary | ICD-10-CM | POA: Diagnosis not present

## 2021-11-26 DIAGNOSIS — I2699 Other pulmonary embolism without acute cor pulmonale: Secondary | ICD-10-CM

## 2021-11-26 DIAGNOSIS — I82403 Acute embolism and thrombosis of unspecified deep veins of lower extremity, bilateral: Secondary | ICD-10-CM

## 2021-11-26 LAB — POCT INR: INR: 2.5 (ref 2.0–3.0)

## 2021-11-26 NOTE — Patient Instructions (Signed)
Description   Continue 1 tablet daily, except Wednesday 1.5 tablets. Recheck INR in 6 weeks. Coumadin Clinic [phone].

## 2021-11-26 NOTE — Telephone Encounter (Signed)
Called patient, advised we had received a call from PCP office-  she is coming to office today for coumadin clinic, I advised with her to pick up the patient assistance forms- they are in triage room. Patient verbalized understanding.

## 2021-11-26 NOTE — Telephone Encounter (Signed)
Pt c/o medication issue:  1. Name of Medication:  empagliflozin (JARDIANCE) 10 MG TABS tablet [[id number]]  2. How are you currently taking this medication (dosage and times per day)? N/A  3. Are you having a reaction (difficulty breathing--STAT)? No   4. What is your medication issue? Lauren from [facility] is calling stating this pt is needing assistance for this medication. She reports the patient is currently using samples, but is almost out. Please advise.

## 2021-12-02 DIAGNOSIS — M8589 Other specified disorders of bone density and structure, multiple sites: Secondary | ICD-10-CM | POA: Diagnosis not present

## 2021-12-03 ENCOUNTER — Telehealth: Payer: Self-pay | Admitting: Cardiovascular Disease

## 2021-12-03 NOTE — Telephone Encounter (Signed)
Valencia informed that we use [facility] as well so they should be able to see our orders, verbalized understanding.

## 2021-12-03 NOTE — Telephone Encounter (Signed)
Nicole Gibbs with [facility] states Dr. Chalmers Cater is drawing labs on 8/03 and sees we have lab orders placed as well. Nicole Gibbs would like to know if orders for lipid, CMP, and CBC can be sent to [facility] so that Dr. Chalmers Cater can draw at one time. Please advise.

## 2021-12-13 DIAGNOSIS — E669 Obesity, unspecified: Secondary | ICD-10-CM | POA: Diagnosis not present

## 2021-12-13 DIAGNOSIS — E0590 Thyrotoxicosis, unspecified without thyrotoxic crisis or storm: Secondary | ICD-10-CM | POA: Diagnosis not present

## 2021-12-13 DIAGNOSIS — I509 Heart failure, unspecified: Secondary | ICD-10-CM | POA: Diagnosis not present

## 2021-12-13 DIAGNOSIS — I10 Essential (primary) hypertension: Secondary | ICD-10-CM | POA: Diagnosis not present

## 2021-12-19 DIAGNOSIS — Z23 Encounter for immunization: Secondary | ICD-10-CM | POA: Diagnosis not present

## 2021-12-20 DIAGNOSIS — Z124 Encounter for screening for malignant neoplasm of cervix: Secondary | ICD-10-CM | POA: Diagnosis not present

## 2021-12-20 DIAGNOSIS — Z1231 Encounter for screening mammogram for malignant neoplasm of breast: Secondary | ICD-10-CM | POA: Diagnosis not present

## 2021-12-20 DIAGNOSIS — Z6841 Body Mass Index (BMI) 40.0 and over, adult: Secondary | ICD-10-CM | POA: Diagnosis not present

## 2021-12-21 DIAGNOSIS — M205X1 Other deformities of toe(s) (acquired), right foot: Secondary | ICD-10-CM | POA: Diagnosis not present

## 2021-12-21 DIAGNOSIS — F5101 Primary insomnia: Secondary | ICD-10-CM | POA: Diagnosis not present

## 2021-12-21 DIAGNOSIS — G4733 Obstructive sleep apnea (adult) (pediatric): Secondary | ICD-10-CM | POA: Diagnosis not present

## 2021-12-30 DIAGNOSIS — E785 Hyperlipidemia, unspecified: Secondary | ICD-10-CM | POA: Diagnosis not present

## 2021-12-30 DIAGNOSIS — I5022 Chronic systolic (congestive) heart failure: Secondary | ICD-10-CM | POA: Diagnosis not present

## 2021-12-31 ENCOUNTER — Ambulatory Visit (INDEPENDENT_AMBULATORY_CARE_PROVIDER_SITE_OTHER): Payer: Medicare Other

## 2021-12-31 ENCOUNTER — Ambulatory Visit (INDEPENDENT_AMBULATORY_CARE_PROVIDER_SITE_OTHER): Payer: Medicare Other | Admitting: Podiatry

## 2021-12-31 DIAGNOSIS — M79671 Pain in right foot: Secondary | ICD-10-CM

## 2021-12-31 DIAGNOSIS — L84 Corns and callosities: Secondary | ICD-10-CM

## 2021-12-31 DIAGNOSIS — M205X1 Other deformities of toe(s) (acquired), right foot: Secondary | ICD-10-CM

## 2021-12-31 LAB — COMPREHENSIVE METABOLIC PANEL
ALT: 9 IU/L (ref 0–32)
AST: 16 IU/L (ref 0–40)
Albumin/Globulin Ratio: 1.8 (ref 1.2–2.2)
Albumin: 4.2 g/dL (ref 3.8–4.8)
Alkaline Phosphatase: 78 IU/L (ref 44–121)
BUN/Creatinine Ratio: 19 (ref 12–28)
BUN: 15 mg/dL (ref 8–27)
Bilirubin Total: 0.6 mg/dL (ref 0.0–1.2)
CO2: 26 mmol/L (ref 20–29)
Calcium: 9 mg/dL (ref 8.7–10.3)
Chloride: 104 mmol/L (ref 96–106)
Creatinine, Ser: 0.79 mg/dL (ref 0.57–1.00)
Globulin, Total: 2.4 g/dL (ref 1.5–4.5)
Glucose: 106 mg/dL — ABNORMAL HIGH (ref 70–99)
Potassium: 4.1 mmol/L (ref 3.5–5.2)
Sodium: 142 mmol/L (ref 134–144)
Total Protein: 6.6 g/dL (ref 6.0–8.5)
eGFR: 78 mL/min/{1.73_m2} (ref >59)

## 2021-12-31 LAB — CBC
Hematocrit: 44.1 % (ref 34.0–46.6)
Hemoglobin: 14.6 g/dL (ref 11.1–15.9)
MCH: 29.9 pg (ref 26.6–33.0)
MCHC: 33.1 g/dL (ref 31.5–35.7)
MCV: 90 fL (ref 79–97)
Platelets: 326 10*3/uL (ref 150–450)
RBC: 4.88 x10E6/uL (ref 3.77–5.28)
RDW: 12.3 % (ref 11.7–15.4)
WBC: 6.1 10*3/uL (ref 3.4–10.8)

## 2021-12-31 LAB — LIPID PANEL
Chol/HDL Ratio: 3.3 ratio (ref 0.0–4.4)
Cholesterol, Total: 163 mg/dL (ref 100–199)
HDL: 50 mg/dL (ref >39)
LDL Chol Calc (NIH): 94 mg/dL (ref 0–99)
Triglycerides: 106 mg/dL (ref 0–149)
VLDL Cholesterol Cal: 19 mg/dL (ref 5–40)

## 2021-12-31 NOTE — Progress Notes (Unsigned)
Subjective:   Patient ID: Nicole Gibbs, female   DOB: 76 y.o.   MRN: [id number]   HPI  Chief Complaint  Patient presents with   Hammer Toe    Right foot hammer toe and hallux ulcer, started 2 weeks ago, pt denies any pain, pt has some bleeding and drainage,     76 year old female presents with the above complaints.  She states that last couple weeks she has noticed the second toe going over the big toe and caused the wound which is bleeding on the big toe.  No recent injuries that she reports.  No swelling or redness.  No fevers or chills.  No other concerns.   Review of Systems  All other systems reviewed and are negative.  Past Medical History:  Diagnosis Date   Anxiety    Arthritis    knees   Chronic combined systolic and diastolic CHF (congestive heart failure) ([place])    a. 07/2012 Echo: EF 55-60%, Gr1 DD;  b. 06/2015 Echo: EF 35-40%, triv AI, Mod MR, mod dil LA, mildly reduced RV.   Depression    DVT, bilateral lower limbs ([place])    H/O cardiovascular stress test    a. 07/2012 MV: fixed mid-dist anterior and apical defect - scar vs attenuation, distal ant HK, no reversibility, EF 51%-->low risk.   Hx of blood clots    Hypertensive heart disease    Hypothyroidism    Joint pain    Nocturia    Normal coronary arteries    by cardiac catheterization 09/03/15   Obese    OSA on CPAP    Postmenopausal vaginal bleeding    Pulmonary embolism, bilateral (HCC)    a. chronic coumadin.   Rash    under breasts   Skin cancer    Sleep apnea    SOB (shortness of breath)    Swelling of both lower extremities    Thyroid nodule    no meds    Past Surgical History:  Procedure Laterality Date   CARDIAC CATHETERIZATION N/A 09/03/2015   Procedure: Left Heart Cath and Coronary Angiography;  Surgeon: [name], MD;  Location: [facility];  Service: Cardiovascular;  Laterality: N/A;   CARDIOVASCULAR STRESS TEST  08-07-2012  DR HILTY/ DR BERRY   LOW RISK NUCLEAR STUDY/ NO  ISCHEMIA/ FIXED MID TO DISTAL ANTERIOR AND APICAL DEFECT MAY REPRESENT SCAR VS BREAST ATTENUATION ARTIFACT/  MILD DISTAL ANTERIOR HYPOKINESIS/ EF 51%   CESAREAN SECTION  1992   COLONOSCOPY N/A 05/08/2015   Procedure: COLONOSCOPY;  Surgeon: Laurence Spates, MD;  Location: [facility];  Service: Endoscopy;  Laterality: N/A;   DILATION AND CURETTAGE OF UTERUS  06/20/2012   Procedure: DILATATION AND CURETTAGE;  Surgeon: [name], MD;  Location: [facility];  Service: Gynecology;  Laterality: N/A;   dilation and evacution  1988   missed ab   [facility] N/A 09/04/2012   Procedure: DILATATION & CURETTAGE WITH THERMACHOICE ABLATION;  Surgeon: [name], MD;  Location: [facility];  Service: Gynecology;  Laterality: N/A;   ESOPHAGOGASTRODUODENOSCOPY N/A 05/07/2015   Procedure: ESOPHAGOGASTRODUODENOSCOPY (EGD);  Surgeon: Laurence Spates, MD;  Location: [facility];  Service: Endoscopy;  Laterality: N/A;   HYSTEROSCOPY WITH D & C  03-24-2009   TRANSTHORACIC ECHOCARDIOGRAM  08-08-2012   LVSF NORMAL/ EF 54-62%/  GRADE I DIASTOLIC DYSFUNCTION/ MILD AV AND MV REGURG./  RVSF MILDLY REDUCED     Current Outpatient Medications:  acetaminophen (TYLENOL) 325 MG tablet, Take 650 mg by mouth every 6 (six) hours as needed for moderate pain., Disp: , Rfl:    empagliflozin (JARDIANCE) 10 MG TABS tablet, Take 10 mg by mouth daily., Disp: , Rfl:    furosemide (LASIX) 40 MG tablet, Take 1 tablet (40 mg total) by mouth daily., Disp: 90 tablet, Rfl: 3   losartan (COZAAR) 50 MG tablet, Take 1 tablet (50 mg total) by mouth daily., Disp: 90 tablet, Rfl: 1   MELATONIN PO, Take by mouth at bedtime as needed., Disp: , Rfl:    methimazole (TAPAZOLE) 5 MG tablet, Take 5 mg by mouth daily., Disp: , Rfl:    metoprolol succinate (TOPROL-XL) 100 MG 24 hr tablet, Take 1 tablet (100 mg total) by mouth daily. Take with or immediately following a meal., Disp: 90  tablet, Rfl: 3   nitroGLYCERIN (NITROSTAT) 0.4 MG SL tablet, Place 1 tablet (0.4 mg total) under the tongue every 5 (five) minutes as needed for chest pain. (Patient not taking: Reported on 09/13/2021), Disp: 25 tablet, Rfl: 3   nystatin cream (MYCOSTATIN), Apply 1 application topically 2 (two) times daily., Disp: 30 g, Rfl: 0   progesterone (PROMETRIUM) 200 MG capsule, Take 200 mg by mouth at bedtime. , Disp: , Rfl:    spironolactone (ALDACTONE) 25 MG tablet, Take 1 tablet (25 mg total) by mouth daily., Disp: 90 tablet, Rfl: 3   warfarin (COUMADIN) 3 MG tablet, TAKE 1-2 TABLETS BY MOUTH ONCE DAILY OR AS DIRECTED BY COUMADIN CLINIC, Disp: 75 tablet, Rfl: 1   zolpidem (AMBIEN) 5 MG tablet, 1 tablet at bedtime., Disp: , Rfl:   Allergies  Allergen Reactions   Keflex [Cephalexin] Diarrhea   Sulfa Antibiotics Hives          Objective:  Physical Exam  General: AAO x3, NAD  Dermatological: On the medial aspect of the hallux IPJ is a hyperkeratotic lesion with dried blood.  Upon debridement there is no underlying ulceration drainage or signs of infection.  On the lateral aspect the hallux there was an area of superficial callus with a second toes rubbing.  There is no drainage or pus or signs of infection.  Vascular: Dorsalis Pedis artery and Posterior Tibial artery pedal pulses are 2/4 bilateral with immedate capillary fill time.  There is no pain with calf compression, swelling, warmth, erythema.   Neruologic: Grossly intact via light touch bilateral.   Musculoskeletal: The second digit is overlapping the hallux.  This is flexible.  Left side certainly the same thing as well but not as symptomatic as the right side.  Muscular strength 5/5 in all groups tested bilateral.  Gait: Unassisted, Nonantalgic.       Assessment:   Right foot crossover toe deformity, preulcerative calluses     Plan:  -Treatment options discussed including all alternatives, risks, and complications. -Etiology of  symptoms were discussed -X-rays were obtained and reviewed with the patient.  3 views bilateral feet were obtained. Crossover toe deformity is noted.  No evidence of acute fracture, osteomyelitis. -Sharply debrided the hyperkeratotic lesion medial hallux with any complications or bleeding. -I dispensed a toe splint to help hold the second toe rectus and avoid pressure from the hallux. -Daily foot inspection  Trula Slade DPM

## 2022-01-04 ENCOUNTER — Ambulatory Visit (INDEPENDENT_AMBULATORY_CARE_PROVIDER_SITE_OTHER): Payer: Medicare Other | Admitting: *Deleted

## 2022-01-04 DIAGNOSIS — I2699 Other pulmonary embolism without acute cor pulmonale: Secondary | ICD-10-CM

## 2022-01-04 DIAGNOSIS — Z7901 Long term (current) use of anticoagulants: Secondary | ICD-10-CM

## 2022-01-04 DIAGNOSIS — I82403 Acute embolism and thrombosis of unspecified deep veins of lower extremity, bilateral: Secondary | ICD-10-CM | POA: Diagnosis not present

## 2022-01-04 LAB — POCT INR: INR: 2.6 (ref 2.0–3.0)

## 2022-01-04 MED ORDER — WARFARIN SODIUM 3 MG PO TABS: ORAL_TABLET | ORAL | 1 refills | Status: DC

## 2022-01-04 NOTE — Patient Instructions (Addendum)
Description   Continue taking 1 tablet daily except 1.5 tablets on Wednesdays. Recheck INR in 6 weeks. [facility]in Clinic [phone].

## 2022-01-07 ENCOUNTER — Ambulatory Visit: Payer: Medicare Other | Admitting: Behavioral Health

## 2022-01-12 ENCOUNTER — Other Ambulatory Visit: Payer: Self-pay | Admitting: Podiatry

## 2022-01-12 DIAGNOSIS — M205X1 Other deformities of toe(s) (acquired), right foot: Secondary | ICD-10-CM

## 2022-01-13 DIAGNOSIS — E669 Obesity, unspecified: Secondary | ICD-10-CM | POA: Diagnosis not present

## 2022-01-13 DIAGNOSIS — E0590 Thyrotoxicosis, unspecified without thyrotoxic crisis or storm: Secondary | ICD-10-CM | POA: Diagnosis not present

## 2022-01-13 DIAGNOSIS — I509 Heart failure, unspecified: Secondary | ICD-10-CM | POA: Diagnosis not present

## 2022-01-13 DIAGNOSIS — I10 Essential (primary) hypertension: Secondary | ICD-10-CM | POA: Diagnosis not present

## 2022-01-14 DIAGNOSIS — Z888 Allergy status to other drugs, medicaments and biological substances status: Secondary | ICD-10-CM | POA: Diagnosis not present

## 2022-01-14 DIAGNOSIS — Z8349 Family history of other endocrine, nutritional and metabolic diseases: Secondary | ICD-10-CM | POA: Diagnosis not present

## 2022-01-14 DIAGNOSIS — E063 Autoimmune thyroiditis: Secondary | ICD-10-CM | POA: Diagnosis not present

## 2022-01-18 ENCOUNTER — Encounter: Payer: Self-pay | Admitting: Cardiovascular Disease

## 2022-01-18 ENCOUNTER — Ambulatory Visit: Payer: Medicare Other | Attending: Cardiovascular Disease | Admitting: Cardiovascular Disease

## 2022-01-18 DIAGNOSIS — I5042 Chronic combined systolic (congestive) and diastolic (congestive) heart failure: Secondary | ICD-10-CM

## 2022-01-18 DIAGNOSIS — I2699 Other pulmonary embolism without acute cor pulmonale: Secondary | ICD-10-CM | POA: Diagnosis not present

## 2022-01-18 DIAGNOSIS — I10 Essential (primary) hypertension: Secondary | ICD-10-CM | POA: Diagnosis not present

## 2022-01-18 NOTE — Progress Notes (Unsigned)
01/18/2022 Nicole Gibbs   Sep 18, 1945  [id number]  Primary Physician Nicole Gibbs Nicole Gibbs, Nicole Gibbs Primary Cardiologist: Nicole Harp MD Nicole Gibbs, Nicole Gibbs  HPI:  Nicole Gibbs is a 76 y.o.  severely overweight, widowed Caucasian female (husband died 3 years ago), mother of one child (daughter Nicole Gibbs), who I last saw him in the office 03/30/2021.  She currently lives alone, goes out that dinner most nights.  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 She had normal LV systolic function and mild RV dysfunction. Ultimately, because of vaginal bleeding, she underwent a D&C with endometrial ablation by Dr. Helane Rima September 05, 7094, without complication. She is no longer bleeding, and her shortness of breath has improved. Her Coumadin is followed here in our office. A followup CT angiogram revealed complete resolution of her pulmonary emboli but she did have an incidentally noted multinodular goiter. Follow-up lower extremity venous Doppler study showed resolution of her DVT. She does complain of progressive dyspnea which is somewhat limiting. She has been admitted to [facility] in December and again in February with anemia and heart failure. A recent Myoview stress test showed anteroapical scar and a 2-D echo revealed an ejection fraction of 35-40% which represents this is a significant decline compared to her EF 3 years ago which was normal.. She has had a negative upper and lower endoscopy with plans to perform pill endoscopy as well. Her hemoglobin has recently remained stable. I performed outpatient cardiac catheterization on her 09/03/15 revealing normal coronary arteries with ejection fraction 35-445% consistent with a nonischemic cardiomyopathy.   Since I saw her in the office approximately a year ago she is remained stable.  She tries to avoid  salt.  She is on guideline directed optimal medical therapy although we have had difficulty with SGLT2 inhibitor such as [place] and Vania Rea because of affordability.  She walks with a cane or a walker but is minimally ambulatory and does complain of dyspnea.  Her last 2D echo performed 11/25/2020 revealed an EF of 25 to 30% with severe global hypokinesia and grade 2 diastolic dysfunction with mild pulmonary hypertension.     Current Meds  Medication Sig   furosemide (LASIX) 40 MG tablet Take 1 tablet (40 mg total) by mouth daily.   lisinopril (ZESTRIL) 5 MG tablet Take 5 mg by mouth daily in the afternoon.   losartan (COZAAR) 50 MG tablet Take 1 tablet (50 mg total) by mouth daily.   medroxyPROGESTERone (PROVERA) 5 MG tablet Take 5 mg by mouth daily in the afternoon.   methimazole (TAPAZOLE) 5 MG tablet Take 5 mg by mouth daily.   nystatin cream (MYCOSTATIN) Apply 1 application topically 2 (two) times daily.   spironolactone (ALDACTONE) 25 MG tablet Take 1 tablet (25 mg total) by mouth daily.   warfarin (COUMADIN) 3 MG tablet TAKE 1-1.5 TABLETS BY MOUTH ONCE DAILY OR AS DIRECTED BY COUMADIN CLINIC   zolpidem (AMBIEN) 5 MG tablet 1 tablet at bedtime.     Allergies  Allergen Reactions   Keflex [Cephalexin] Diarrhea   Sulfa Antibiotics Hives    Social History   Socioeconomic History   Marital status: Widowed    Spouse name: Not on file   Number of children: Not on file   Years of education: Not on file   Highest education level: Not on file  Occupational History   Occupation: retired  Tobacco Use   Smoking status: Never   Smokeless tobacco: Never  Substance and Sexual Activity   Alcohol use: No   Drug use: No   Sexual activity: Not on file  Other Topics Concern   Not on file  Social History Narrative   Not on file   Social Determinants of Health   Financial Resource Strain: Low Risk  (04/05/2021)   Overall Financial Resource Strain (CARDIA)    Difficulty of Paying  Living Expenses: Not very hard  Food Insecurity: No Food Insecurity (04/05/2021)   Hunger Vital Sign    Worried About Running Out of Food in the Last Year: Never true    Ran Out of Food in the Last Year: Never true  Transportation Needs: No Transportation Needs (04/05/2021)   PRAPARE - Hydrologist (Medical): No    Lack of Transportation (Non-Medical): No  Physical Activity: Not on file  Stress: Not on file  Social Connections: Not on file  Intimate Partner Violence: Not on file     Review of Systems: General: negative for chills, fever, night sweats or weight changes.  Cardiovascular: negative for chest pain, dyspnea on exertion, edema, orthopnea, palpitations, paroxysmal nocturnal dyspnea or shortness of breath Dermatological: negative for rash Respiratory: negative for cough or wheezing Urologic: negative for hematuria Abdominal: negative for nausea, vomiting, diarrhea, bright red blood per rectum, melena, or hematemesis Neurologic: negative for visual changes, syncope, or dizziness All other systems reviewed and are otherwise negative except as noted above.    Blood pressure 136/88, pulse 72, height '5\' 1"'$  (1.549 m), weight 223 lb 9.6 oz (101.4 kg), SpO2 94 %.  General appearance: alert and no distress Neck: no adenopathy, no carotid bruit, no JVD, supple, symmetrical, trachea midline, and thyroid not enlarged, symmetric, no tenderness/mass/nodules Lungs: clear to auscultation bilaterally Heart: regular rate and rhythm, S1, S2 normal, no murmur, click, rub or gallop Extremities: extremities normal, atraumatic, no cyanosis or edema Pulses: 2+ and symmetric Skin: Skin color, texture, turgor normal. No rashes or lesions Neurologic: Grossly normal  EKG sinus rhythm at 72 with nonspecific IVCD and poor R wave progression along with left-ventricular perjury.  I personally reviewed this EKG.  ASSESSMENT AND PLAN:   Pulmonary embolism,  Bilateral History of bilateral DVT and pulmonary emboli on Coumadin anticoagulation.  Essential hypertension History of essential hypertension with blood pressure measured today at 136/88.  She is on an ARB and metoprolol.  Chronic combined systolic and diastolic CHF (congestive heart failure) (HCC) History of combined systolic and diastolic heart failure.  I did perform cardiac catheterization on her in 2017 revealing normal coronary arteries.  We have tried to get her on guideline directed automatically therapy but this has been limited by affordability and medication compliance.  She does complain of dyspnea but is minimally ambulatory.  Her most recent 2D echo performed 11/25/2020 revealed an EF of 25 to 30% with severe global hypokinesia and grade 2 diastolic dysfunction.     Nicole Harp MD FACP,FACC,FAHA, [facility] 01/18/2022 4:17 PM

## 2022-01-18 NOTE — Patient Instructions (Signed)
Medication Instructions:  Your physician recommends that you continue on your current medications as directed. Please refer to the Current Medication list given to you today.  *If you need a refill on your cardiac medications before your next appointment, please call your pharmacy*   Testing/Procedures: Your physician has requested that you have an echocardiogram. Echocardiography is a painless test that uses sound waves to create images of your heart. It provides your doctor with information about the size and shape of your heart and how well your heart's chambers and valves are working. This procedure takes approximately one hour. There are no restrictions for this procedure. This procedure will be done at [address]   Follow-Up: At [facility], you and your health needs are our priority.  As part of our continuing mission to provide you with exceptional heart care, we have created designated Provider Care Teams.  These Care Teams include your primary Cardiologist (physician) and Advanced Practice Providers (APPs -  Physician Assistants and Nurse Practitioners) who all work together to provide you with the care you need, when you need it.  We recommend signing up for the patient portal called "MyChart".  Sign up information is provided on this After Visit Summary.  MyChart is used to connect with patients for Virtual Visits (Telemedicine).  Patients are able to view lab/test results, encounter notes, upcoming appointments, etc.  Non-urgent messages can be sent to your provider as well.   To learn more about what you can do with MyChart, go to [email or web address].    Your next appointment:   6 month(s)  The format for your next appointment:   In Person  Provider:   Almyra Deforest, PA-C        Then, Quay Burow, MD will plan to see you again in 12 month(s).

## 2022-01-18 NOTE — Assessment & Plan Note (Signed)
History of combined systolic and diastolic heart failure.  I did perform cardiac catheterization on her in 2017 revealing normal coronary arteries.  We have tried to get her on guideline directed automatically therapy but this has been limited by affordability and medication compliance.  She does complain of dyspnea but is minimally ambulatory.  Her most recent 2D echo performed 11/25/2020 revealed an EF of 25 to 30% with severe global hypokinesia and grade 2 diastolic dysfunction.

## 2022-01-18 NOTE — Assessment & Plan Note (Signed)
History of bilateral DVT and pulmonary emboli on Coumadin anticoagulation.

## 2022-01-18 NOTE — Assessment & Plan Note (Signed)
History of essential hypertension with blood pressure measured today at 136/88.  She is on an ARB and metoprolol.

## 2022-01-19 DIAGNOSIS — G4700 Insomnia, unspecified: Secondary | ICD-10-CM | POA: Diagnosis not present

## 2022-01-19 DIAGNOSIS — Z6841 Body Mass Index (BMI) 40.0 and over, adult: Secondary | ICD-10-CM | POA: Diagnosis not present

## 2022-01-19 DIAGNOSIS — M810 Age-related osteoporosis without current pathological fracture: Secondary | ICD-10-CM | POA: Diagnosis not present

## 2022-01-19 DIAGNOSIS — I509 Heart failure, unspecified: Secondary | ICD-10-CM | POA: Diagnosis not present

## 2022-01-19 DIAGNOSIS — G4733 Obstructive sleep apnea (adult) (pediatric): Secondary | ICD-10-CM | POA: Diagnosis not present

## 2022-01-27 DIAGNOSIS — Z23 Encounter for immunization: Secondary | ICD-10-CM | POA: Diagnosis not present

## 2022-01-31 ENCOUNTER — Ambulatory Visit (HOSPITAL_COMMUNITY): Payer: Medicare Other | Attending: Cardiovascular Disease

## 2022-01-31 DIAGNOSIS — I10 Essential (primary) hypertension: Secondary | ICD-10-CM | POA: Insufficient documentation

## 2022-01-31 DIAGNOSIS — I5042 Chronic combined systolic (congestive) and diastolic (congestive) heart failure: Secondary | ICD-10-CM | POA: Diagnosis not present

## 2022-01-31 DIAGNOSIS — I2699 Other pulmonary embolism without acute cor pulmonale: Secondary | ICD-10-CM | POA: Diagnosis not present

## 2022-01-31 LAB — ECHOCARDIOGRAM COMPLETE
Area-P 1/2: 4.89 cm2
P 1/2 time: 517 msec
S' Lateral: 3 cm

## 2022-02-02 ENCOUNTER — Telehealth: Payer: Self-pay | Admitting: Cardiovascular Disease

## 2022-02-02 NOTE — Telephone Encounter (Signed)
Beatrix Fetters, RN  02/01/2022  3:20 PM EDT     Left message for pt to call back.   [name], MD  01/31/2022  4:09 PM EDT     Significant improvement in ejection fraction from 20 to 25% up to 40 to 45%.  Continue current medications.   Patient called w/results  She is having trouble getting Jardiance d/t cost. She said [facility] filed for assistance but she has not heard back. Routed to him for follow up

## 2022-02-02 NOTE — Telephone Encounter (Signed)
Pt returning a call for echo results  

## 2022-02-03 DIAGNOSIS — E0590 Thyrotoxicosis, unspecified without thyrotoxic crisis or storm: Secondary | ICD-10-CM | POA: Diagnosis not present

## 2022-02-03 DIAGNOSIS — E049 Nontoxic goiter, unspecified: Secondary | ICD-10-CM | POA: Diagnosis not present

## 2022-02-03 NOTE — Telephone Encounter (Signed)
Patient assistance forms have been faxed. Waiting on response. Checked closet and we are out of samples. Will message the rep.

## 2022-02-03 NOTE — Telephone Encounter (Signed)
Patient called and she will pick up samples of Jardiance '10mg'$  at [facility]

## 2022-02-12 DIAGNOSIS — E0590 Thyrotoxicosis, unspecified without thyrotoxic crisis or storm: Secondary | ICD-10-CM | POA: Diagnosis not present

## 2022-02-12 DIAGNOSIS — I10 Essential (primary) hypertension: Secondary | ICD-10-CM | POA: Diagnosis not present

## 2022-02-12 DIAGNOSIS — I509 Heart failure, unspecified: Secondary | ICD-10-CM | POA: Diagnosis not present

## 2022-02-12 DIAGNOSIS — E669 Obesity, unspecified: Secondary | ICD-10-CM | POA: Diagnosis not present

## 2022-02-15 ENCOUNTER — Ambulatory Visit: Payer: Medicare Other | Attending: Cardiovascular Disease

## 2022-02-15 DIAGNOSIS — Z7901 Long term (current) use of anticoagulants: Secondary | ICD-10-CM

## 2022-02-15 DIAGNOSIS — I82403 Acute embolism and thrombosis of unspecified deep veins of lower extremity, bilateral: Secondary | ICD-10-CM | POA: Diagnosis not present

## 2022-02-15 LAB — POCT INR: INR: 2.9 (ref 2.0–3.0)

## 2022-02-15 NOTE — Patient Instructions (Signed)
Continue taking 1 tablet daily except 1.5 tablets on Wednesdays. Recheck INR in 6 weeks. Coumadin Clinic [phone].

## 2022-03-04 ENCOUNTER — Other Ambulatory Visit (HOSPITAL_COMMUNITY): Payer: Self-pay | Admitting: Endocrinology

## 2022-03-04 DIAGNOSIS — E0590 Thyrotoxicosis, unspecified without thyrotoxic crisis or storm: Secondary | ICD-10-CM

## 2022-03-15 DIAGNOSIS — E0590 Thyrotoxicosis, unspecified without thyrotoxic crisis or storm: Secondary | ICD-10-CM | POA: Diagnosis not present

## 2022-03-15 DIAGNOSIS — I509 Heart failure, unspecified: Secondary | ICD-10-CM | POA: Diagnosis not present

## 2022-03-15 DIAGNOSIS — E669 Obesity, unspecified: Secondary | ICD-10-CM | POA: Diagnosis not present

## 2022-03-15 DIAGNOSIS — I10 Essential (primary) hypertension: Secondary | ICD-10-CM | POA: Diagnosis not present

## 2022-03-16 ENCOUNTER — Encounter (HOSPITAL_COMMUNITY)
Admission: RE | Admit: 2022-03-16 | Discharge: 2022-03-16 | Disposition: A | Discharge status: Home / Self Care | Payer: Medicare Other | Source: Ambulatory Visit | Attending: Endocrinology | Admitting: Endocrinology

## 2022-03-16 DIAGNOSIS — E0590 Thyrotoxicosis, unspecified without thyrotoxic crisis or storm: Secondary | ICD-10-CM | POA: Insufficient documentation

## 2022-03-16 MED ORDER — SODIUM IODIDE I-123 7.4 MBQ CAPS
406.0000 | ORAL_CAPSULE | Freq: Once | ORAL | Status: AC
  Administered 2022-03-16: 406 via ORAL

## 2022-03-17 ENCOUNTER — Encounter (HOSPITAL_COMMUNITY)
Admission: RE | Admit: 2022-03-17 | Discharge: 2022-03-17 | Disposition: A | Discharge status: Home / Self Care | Payer: Medicare Other | Source: Ambulatory Visit | Attending: Endocrinology | Admitting: Endocrinology

## 2022-03-17 DIAGNOSIS — E041 Nontoxic single thyroid nodule: Secondary | ICD-10-CM | POA: Diagnosis not present

## 2022-03-17 DIAGNOSIS — E0590 Thyrotoxicosis, unspecified without thyrotoxic crisis or storm: Secondary | ICD-10-CM | POA: Diagnosis not present

## 2022-03-29 ENCOUNTER — Ambulatory Visit (INDEPENDENT_AMBULATORY_CARE_PROVIDER_SITE_OTHER): Payer: Medicare Other | Admitting: Psychiatry

## 2022-03-29 DIAGNOSIS — Z604 Social exclusion and rejection: Secondary | ICD-10-CM

## 2022-03-29 DIAGNOSIS — F423 Hoarding disorder: Secondary | ICD-10-CM

## 2022-03-29 DIAGNOSIS — F411 Generalized anxiety disorder: Secondary | ICD-10-CM | POA: Diagnosis not present
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
## 2022-03-30 ENCOUNTER — Ambulatory Visit: Payer: Medicare Other | Attending: Internal Medicine | Admitting: *Deleted

## 2022-03-30 DIAGNOSIS — Z7901 Long term (current) use of anticoagulants: Secondary | ICD-10-CM

## 2022-03-30 DIAGNOSIS — I82403 Acute embolism and thrombosis of unspecified deep veins of lower extremity, bilateral: Secondary | ICD-10-CM

## 2022-03-30 DIAGNOSIS — I2699 Other pulmonary embolism without acute cor pulmonale: Secondary | ICD-10-CM

## 2022-03-30 LAB — POCT INR: INR: 3.4 — AB (ref 2.0–3.0)

## 2022-03-30 NOTE — Patient Instructions (Signed)
Description   Do not take any warfarin today then continue taking warfarin 1 tablet daily except 1.5 tablets on Wednesdays. Recheck INR in 4 weeks. Coumadin Clinic [phone].

## 2022-04-06 ENCOUNTER — Other Ambulatory Visit (HOSPITAL_COMMUNITY): Payer: Self-pay | Admitting: Endocrinology

## 2022-04-06 DIAGNOSIS — E0590 Thyrotoxicosis, unspecified without thyrotoxic crisis or storm: Secondary | ICD-10-CM

## 2022-04-06 NOTE — Written Directive (Addendum)
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 Thyroid uptake was calculated with a thyroid probe at 4-6 hours and 24 hours. RADIOPHARMACEUTICALS:  406 uCi I-123 sodium iodide p.o. COMPARISON:  06/26/2018 FINDINGS: Inhomogeneous tracer uptake in both thyroid lobes. Dominant warm nodule at mid to inferior LEFT thyroid lobe. 4 hour I-123 uptake = 10.3% (normal 5-20%) 24 hour I-123 uptake = 24.7% (normal 10-30%) IMPRESSION: Warm nodule at mid inferior LEFT thyroid lobe. Normal 4 hour and 24 hour radio iodine uptakes. Electronically Signed   By: Lavonia Dana M.D.   On: 03/17/2022 17:01      ADDITIONAL PHYSICIAN  Toxic multinodular goiter  AUTHORIZED USER SIGNATURE & TIME STAMP: Rennis Golden, MD   04/11/22    7:58 PM

## 2022-04-12 DIAGNOSIS — E7800 Pure hypercholesterolemia, unspecified: Secondary | ICD-10-CM | POA: Diagnosis not present

## 2022-04-12 DIAGNOSIS — R7301 Impaired fasting glucose: Secondary | ICD-10-CM | POA: Diagnosis not present

## 2022-04-12 DIAGNOSIS — Z0000 Encounter for general adult medical examination without abnormal findings: Secondary | ICD-10-CM | POA: Diagnosis not present

## 2022-04-12 DIAGNOSIS — E049 Nontoxic goiter, unspecified: Secondary | ICD-10-CM | POA: Diagnosis not present

## 2022-04-14 DIAGNOSIS — E0590 Thyrotoxicosis, unspecified without thyrotoxic crisis or storm: Secondary | ICD-10-CM | POA: Diagnosis not present

## 2022-04-14 DIAGNOSIS — I509 Heart failure, unspecified: Secondary | ICD-10-CM | POA: Diagnosis not present

## 2022-04-14 DIAGNOSIS — I10 Essential (primary) hypertension: Secondary | ICD-10-CM | POA: Diagnosis not present

## 2022-04-14 DIAGNOSIS — E669 Obesity, unspecified: Secondary | ICD-10-CM | POA: Diagnosis not present

## 2022-04-19 DIAGNOSIS — Z0000 Encounter for general adult medical examination without abnormal findings: Secondary | ICD-10-CM | POA: Diagnosis not present

## 2022-04-19 DIAGNOSIS — M810 Age-related osteoporosis without current pathological fracture: Secondary | ICD-10-CM | POA: Diagnosis not present

## 2022-04-19 DIAGNOSIS — G4700 Insomnia, unspecified: Secondary | ICD-10-CM | POA: Diagnosis not present

## 2022-04-19 DIAGNOSIS — I10 Essential (primary) hypertension: Secondary | ICD-10-CM | POA: Diagnosis not present

## 2022-04-19 DIAGNOSIS — N3001 Acute cystitis with hematuria: Secondary | ICD-10-CM | POA: Diagnosis not present

## 2022-04-25 ENCOUNTER — Telehealth: Payer: Self-pay | Admitting: Cardiovascular Disease

## 2022-04-25 NOTE — Telephone Encounter (Signed)
Calling to say that patient assistant program need to be renew by our office for   empagliflozin (JARDIANCE) 10 MG TABS tablet  . Please advise

## 2022-04-25 NOTE — Telephone Encounter (Signed)
Left message for Lauren at [facility] and with the patient, as well.

## 2022-04-27 ENCOUNTER — Ambulatory Visit: Payer: Medicare Other | Attending: Cardiovascular Disease

## 2022-04-27 DIAGNOSIS — Z7901 Long term (current) use of anticoagulants: Secondary | ICD-10-CM | POA: Diagnosis not present

## 2022-04-27 DIAGNOSIS — I82403 Acute embolism and thrombosis of unspecified deep veins of lower extremity, bilateral: Secondary | ICD-10-CM | POA: Diagnosis not present

## 2022-04-27 DIAGNOSIS — I2699 Other pulmonary embolism without acute cor pulmonale: Secondary | ICD-10-CM

## 2022-04-27 LAB — POCT INR: INR: 2.7 (ref 2.0–3.0)

## 2022-04-27 MED ORDER — WARFARIN SODIUM 3 MG PO TABS: ORAL_TABLET | ORAL | 0 refills | Status: DC

## 2022-04-27 NOTE — Telephone Encounter (Signed)
Left message for patient to call if she needs another application to fill out. She is to bring the application by the office if she has already redone the paperwork.

## 2022-04-27 NOTE — Patient Instructions (Signed)
Description   Continue taking warfarin 1 tablet daily except 1.5 tablets on Wednesdays. Recheck INR in 5 weeks.  Coumadin Clinic [phone].

## 2022-04-28 ENCOUNTER — Ambulatory Visit (INDEPENDENT_AMBULATORY_CARE_PROVIDER_SITE_OTHER): Payer: Medicare Other | Admitting: Psychiatry

## 2022-04-28 DIAGNOSIS — Z604 Social exclusion and rejection: Secondary | ICD-10-CM

## 2022-04-28 DIAGNOSIS — Z98811 Dental restoration status: Secondary | ICD-10-CM

## 2022-04-28 DIAGNOSIS — Z654 Victim of crime and terrorism: Secondary | ICD-10-CM

## 2022-04-28 DIAGNOSIS — F411 Generalized anxiety disorder: Secondary | ICD-10-CM | POA: Diagnosis not present

## 2022-04-28 DIAGNOSIS — F423 Hoarding disorder: Secondary | ICD-10-CM
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
## 2022-05-02 DIAGNOSIS — I788 Other diseases of capillaries: Secondary | ICD-10-CM | POA: Diagnosis not present

## 2022-05-02 DIAGNOSIS — L03031 Cellulitis of right toe: Secondary | ICD-10-CM | POA: Diagnosis not present

## 2022-05-02 DIAGNOSIS — L821 Other seborrheic keratosis: Secondary | ICD-10-CM | POA: Diagnosis not present

## 2022-05-02 DIAGNOSIS — L03011 Cellulitis of right finger: Secondary | ICD-10-CM | POA: Diagnosis not present

## 2022-05-02 DIAGNOSIS — D1801 Hemangioma of skin and subcutaneous tissue: Secondary | ICD-10-CM | POA: Diagnosis not present

## 2022-05-02 DIAGNOSIS — L304 Erythema intertrigo: Secondary | ICD-10-CM | POA: Diagnosis not present

## 2022-05-02 DIAGNOSIS — Z85828 Personal history of other malignant neoplasm of skin: Secondary | ICD-10-CM | POA: Diagnosis not present

## 2022-05-02 DIAGNOSIS — L814 Other melanin hyperpigmentation: Secondary | ICD-10-CM | POA: Diagnosis not present

## 2022-05-02 DIAGNOSIS — D485 Neoplasm of uncertain behavior of skin: Secondary | ICD-10-CM | POA: Diagnosis not present

## 2022-05-05 ENCOUNTER — Other Ambulatory Visit: Payer: Self-pay | Admitting: Physician Assistant

## 2022-05-19 ENCOUNTER — Ambulatory Visit (HOSPITAL_COMMUNITY)
Admission: RE | Admit: 2022-05-19 | Discharge: 2022-05-19 | Disposition: A | Discharge status: Home / Self Care | Payer: Medicare Other | Source: Ambulatory Visit | Attending: Endocrinology | Admitting: Endocrinology

## 2022-05-24 ENCOUNTER — Ambulatory Visit (INDEPENDENT_AMBULATORY_CARE_PROVIDER_SITE_OTHER): Payer: BLUE CROSS/BLUE SHIELD | Admitting: Psychiatry

## 2022-05-24 DIAGNOSIS — R69 Illness, unspecified: Secondary | ICD-10-CM | POA: Diagnosis not present

## 2022-05-24 DIAGNOSIS — F423 Hoarding disorder: Secondary | ICD-10-CM | POA: Diagnosis not present

## 2022-05-24 DIAGNOSIS — F411 Generalized anxiety disorder: Secondary | ICD-10-CM | POA: Diagnosis not present

## 2022-05-24 DIAGNOSIS — Z654 Victim of crime and terrorism: Secondary | ICD-10-CM

## 2022-05-24 DIAGNOSIS — Z604 Social exclusion and rejection: Secondary | ICD-10-CM
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
## 2022-05-25 ENCOUNTER — Encounter (HOSPITAL_COMMUNITY)
Admission: RE | Admit: 2022-05-25 | Discharge: 2022-05-25 | Disposition: A | Discharge status: Home / Self Care | Payer: Medicare Other | Source: Ambulatory Visit | Attending: Endocrinology | Admitting: Endocrinology

## 2022-05-25 DIAGNOSIS — E0520 Thyrotoxicosis with toxic multinodular goiter without thyrotoxic crisis or storm: Secondary | ICD-10-CM | POA: Diagnosis not present

## 2022-05-25 DIAGNOSIS — E0590 Thyrotoxicosis, unspecified without thyrotoxic crisis or storm: Secondary | ICD-10-CM | POA: Insufficient documentation

## 2022-05-25 MED ORDER — SODIUM IODIDE I 131 CAPSULE: 28.7000 | Freq: Once | INTRAVENOUS | Status: DC | PRN

## 2022-06-01 ENCOUNTER — Ambulatory Visit (INDEPENDENT_AMBULATORY_CARE_PROVIDER_SITE_OTHER): Payer: Medicare Other | Admitting: Psychiatry

## 2022-06-01 DIAGNOSIS — R69 Illness, unspecified: Secondary | ICD-10-CM

## 2022-06-01 DIAGNOSIS — F411 Generalized anxiety disorder: Secondary | ICD-10-CM

## 2022-06-01 DIAGNOSIS — F423 Hoarding disorder: Secondary | ICD-10-CM

## 2022-06-01 DIAGNOSIS — Z604 Social exclusion and rejection: Secondary | ICD-10-CM

## 2022-06-01 NOTE — Progress Notes (Signed)
Psychotherapy Progress Note [facility] [name], PhD LP  Patient ID: Nicole Gibbs (Nicole Gibbs)    MRN: [id number] Therapy format: Individual psychotherapy Date: 06/01/2022      Start: 2:12p     Stop: 3:02p     Time Spent: 50 min Location: In-person   Session narrative (presenting needs, interim history, self-report of stressors and symptoms, applications of prior therapy, status changes, and interventions made in session) Took up offer to bring a bag of procrastinated mail, opened and assessed together.  Identified thousands of dollars in uncashed benefit checks dating to her deceased husband's health care claims 2016-2020, about $87 owed [place] Dept [employer] Revenue for missed late fee penalty on 2022 taxes, a pile of inconsequential EOBs for her own [employer] plan, and a stack of monthly bills in varying amounts for what looks like service fees for an [employer] benefit plan that appear to have been paid automatically somehow and are also inconsequential.  Additionally, she reports never having approached Social Security to report Nicole Gibbs's death and activate her widow's benefit, and having continued to pay a [employer] on [place] even after his death.  Sticky notes and instructions made on what to do with each scenario.  Able to claim progress despite a number of self-deprecating and distracting comments about what else is wrong.  Agreed to resume next session seeing through tasks she has not gotten to on her own by then, e.g., collaborative phone call to the benefits plan to reissue check.  Therapeutic modalities: Cognitive Behavioral Therapy, Solution-Oriented/Positive Psychology, and Ego-Supportive  Mental Status/Observations:  Appearance:   Casual     Behavior:  Less frazzled  Motor:  Less restless  -- noted cane is held as necessary but never touches the ground while walking  Speech/Language:   Clear and Coherent  Affect:  Appropriate  Mood:  anxious  Thought process:   normal, some pressure to sidetrack  Thought content:    WNL  Sensory/Perceptual disturbances:    WNL  Orientation:  Fully oriented  Attention:  Good    Concentration:  Fair  Memory:  WNL  Insight:    Fair  Judgment:   Good  Impulse Control:  Fair   Risk Assessment: Danger to Self: No Self-injurious Behavior: No Danger to Others: No Physical Aggression / Violence: No Duty to Warn: No Access to Firearms a concern: No  Assessment of progress:  progressing  Diagnosis:   ICD-10-CM   1. 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 Generalized anxiety disorder  F41.1     3. Social isolation  Z60.4     4. r/o MCI  R69      Plan: Clutter/life admin Try to clear any reasonable amount of paper clutter at home -- glean checks, toss trash, collect things she needs to make more complicated decisions about and reserve for when she has the initiative Continuing offer to bring a bag/box to session if it proves too difficult at home and collaborate to assess and direct what to do Make calls as directed to find out whether expired benefit checks can be reprocessed and promptly pay accounts payable Assess priorities for further decluttering and processing Consider hiring a home care worker to help assess and move through [place] -- Contact a computer engineer/helper to check and reestablish internet security, ASAP.  Unplug com[uter from Internet immediately to stop remote control of it Health care -- Go with dentist and 1st crown as  a test project, then decide whether to stay or switch Other recommendations/advice as may be noted above Continue to utilize previously learned skills ad lib Maintain medication as prescribed and work faithfully with relevant prescriber(s) if any changes are desired or seem indicated Call the clinic on-call service, 988/hotline, 911, or present to [facility] or ER if any life-threatening psychiatric crisis Return for as already scheduled. Already  scheduled visit in this office 06/10/2022.  Blanchie Serve, PhD [name], PhD LP Clinical Psychologist, [facility] [address], [place], [place] [address] [phone]

## 2022-06-03 ENCOUNTER — Telehealth: Payer: Self-pay

## 2022-06-03 ENCOUNTER — Ambulatory Visit: Payer: Medicare Other | Attending: Cardiology | Admitting: *Deleted

## 2022-06-03 DIAGNOSIS — I82403 Acute embolism and thrombosis of unspecified deep veins of lower extremity, bilateral: Secondary | ICD-10-CM | POA: Diagnosis not present

## 2022-06-03 DIAGNOSIS — Z7901 Long term (current) use of anticoagulants: Secondary | ICD-10-CM | POA: Diagnosis not present

## 2022-06-03 DIAGNOSIS — I2699 Other pulmonary embolism without acute cor pulmonale: Secondary | ICD-10-CM | POA: Diagnosis not present

## 2022-06-03 LAB — POCT INR: INR: 4.5 — AB (ref 2.0–3.0)

## 2022-06-03 MED ORDER — WARFARIN SODIUM 3 MG PO TABS: ORAL_TABLET | ORAL | 3 refills | Status: DC

## 2022-06-03 NOTE — Patient Instructions (Addendum)
Description   Do not take any warfarin today and take 1/2 tablet of warfarin tomorrow then continue taking warfarin 1 tablet daily except 1.5 tablets on Wednesdays. Recheck INR in 2 weeks.  Coumadin Clinic [phone].

## 2022-06-03 NOTE — Telephone Encounter (Signed)
Medication samples have been provided to the patient.  Drug name: Vanessa Ralphs: 3 boxes  LOT: [id number]  Exp.Date: 05/2024  Pt was here for coumadin visit and asked front desk staff if she could get samples. Samples provided.

## 2022-06-10 ENCOUNTER — Ambulatory Visit (INDEPENDENT_AMBULATORY_CARE_PROVIDER_SITE_OTHER): Payer: Medicare Other | Admitting: Psychiatry

## 2022-06-10 DIAGNOSIS — F411 Generalized anxiety disorder: Secondary | ICD-10-CM

## 2022-06-10 DIAGNOSIS — Z654 Victim of crime and terrorism: Secondary | ICD-10-CM

## 2022-06-10 DIAGNOSIS — Z604 Social exclusion and rejection: Secondary | ICD-10-CM

## 2022-06-10 DIAGNOSIS — R69 Illness, unspecified: Secondary | ICD-10-CM

## 2022-06-10 DIAGNOSIS — F423 Hoarding disorder: Secondary | ICD-10-CM | POA: Diagnosis not present
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
## 2022-06-13 ENCOUNTER — Telehealth: Payer: Self-pay | Admitting: Cardiovascular Disease

## 2022-06-13 NOTE — Telephone Encounter (Signed)
*  STAT* If patient is at the pharmacy, call can be transferred to refill team.   1. Which medications need to be refilled? (please list name of each medication and dose if known)  spironolactone (ALDACTONE) 25 MG tablet   2. Which pharmacy/location (including street and city if local pharmacy) is medication to be sent to? [place], [place] - [address] AT [place]   3. Do they need a 30 day or 90 day supply? 90 day supply

## 2022-06-15 DIAGNOSIS — E669 Obesity, unspecified: Secondary | ICD-10-CM | POA: Diagnosis not present

## 2022-06-15 DIAGNOSIS — E0590 Thyrotoxicosis, unspecified without thyrotoxic crisis or storm: Secondary | ICD-10-CM | POA: Diagnosis not present

## 2022-06-15 DIAGNOSIS — I509 Heart failure, unspecified: Secondary | ICD-10-CM | POA: Diagnosis not present

## 2022-06-15 DIAGNOSIS — I10 Essential (primary) hypertension: Secondary | ICD-10-CM | POA: Diagnosis not present

## 2022-06-15 MED ORDER — SPIRONOLACTONE 25 MG PO TABS: 25.0000 mg | ORAL_TABLET | Freq: Every day | ORAL | 0 refills | Status: DC

## 2022-06-15 NOTE — Telephone Encounter (Signed)
[facility] is calling to see if a call can be made to the patient to start the assistance for this medication again.

## 2022-06-15 NOTE — Telephone Encounter (Signed)
Tried to return call to [place]. She has left for the day.  Called the pt to discuss pt assistance forms with her for Jardiance. Pt states that she needs to fill out forms for this year. She will be at the office for a coumadin check on Friday 2/2. At the visit we will have her fill out the pt portion of the application. She will also bring proof of income with her. Pt verbalizes understanding.

## 2022-06-16 ENCOUNTER — Telehealth: Payer: Self-pay | Admitting: Cardiovascular Disease

## 2022-06-16 NOTE — Telephone Encounter (Signed)
Spoke with Sherian Rein at [facility] who stated patient wants help with PAP this year for Jardiance. Spoke to patient who wanted PAP form and samples. Stated she will be at coumadin clinic tomorrow and will complete the form then. Jardiance '10mg'$  samples (2 boxes) Lot: # [id number]; Exp 1/26.

## 2022-06-16 NOTE — Telephone Encounter (Signed)
Nicole Gibbs with [employer] returning a call.  Phone: [phone]

## 2022-06-17 ENCOUNTER — Ambulatory Visit: Payer: Medicare Other | Attending: Cardiovascular Disease

## 2022-06-17 DIAGNOSIS — I82403 Acute embolism and thrombosis of unspecified deep veins of lower extremity, bilateral: Secondary | ICD-10-CM | POA: Insufficient documentation

## 2022-06-17 DIAGNOSIS — Z5181 Encounter for therapeutic drug level monitoring: Secondary | ICD-10-CM

## 2022-06-17 DIAGNOSIS — Z7901 Long term (current) use of anticoagulants: Secondary | ICD-10-CM

## 2022-06-17 LAB — POCT INR: INR: 3.1 — AB (ref 2.0–3.0)

## 2022-06-17 NOTE — Patient Instructions (Signed)
continue taking warfarin 1 tablet daily except 1.5 tablets on Wednesdays. Recheck INR in 4 weeks.  Coumadin Clinic [phone].

## 2022-06-22 ENCOUNTER — Ambulatory Visit: Payer: Medicare Other | Admitting: Psychiatry

## 2022-06-22 ENCOUNTER — Ambulatory Visit (INDEPENDENT_AMBULATORY_CARE_PROVIDER_SITE_OTHER): Payer: Medicare Other | Admitting: Psychiatry

## 2022-06-22 DIAGNOSIS — F411 Generalized anxiety disorder: Secondary | ICD-10-CM | POA: Diagnosis not present

## 2022-06-22 DIAGNOSIS — R69 Illness, unspecified: Secondary | ICD-10-CM

## 2022-06-22 DIAGNOSIS — Z604 Social exclusion and rejection: Secondary | ICD-10-CM | POA: Diagnosis not present

## 2022-06-22 DIAGNOSIS — Z654 Victim of crime and terrorism: Secondary | ICD-10-CM
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
## 2022-06-23 NOTE — Telephone Encounter (Signed)
Faxed completed patient assistance forms to [employer] for [employer]

## 2022-06-30 ENCOUNTER — Telehealth: Payer: Self-pay | Admitting: Cardiovascular Disease

## 2022-06-30 NOTE — Telephone Encounter (Signed)
Nicole Gibbs is calling stating the patient's application for jardiance is missing the date on the first page and proof of income. Please advise.

## 2022-07-05 ENCOUNTER — Ambulatory Visit (INDEPENDENT_AMBULATORY_CARE_PROVIDER_SITE_OTHER): Payer: Self-pay | Admitting: Psychiatry

## 2022-07-05 DIAGNOSIS — Z91199 Patient's noncompliance with other medical treatment and regimen due to unspecified reason: Secondary | ICD-10-CM

## 2022-07-06 NOTE — Progress Notes (Signed)
Admin note for non-service contact  Patient ID: Nicole Gibbs  MRN: [id number] DATE: 07/05/2022  No show for 10am session.  Unable to reach but scheduled ahead.  Charge normally.  Blanchie Serve, PhD [name], PhD LP Clinical Psychologist, [facility] [address] [place], [place] [address] [phone]

## 2022-07-13 NOTE — Telephone Encounter (Signed)
Tobie Poet calling to follow up on the jardiance application.

## 2022-07-14 DIAGNOSIS — E0590 Thyrotoxicosis, unspecified without thyrotoxic crisis or storm: Secondary | ICD-10-CM | POA: Diagnosis not present

## 2022-07-14 DIAGNOSIS — I10 Essential (primary) hypertension: Secondary | ICD-10-CM | POA: Diagnosis not present

## 2022-07-14 DIAGNOSIS — I509 Heart failure, unspecified: Secondary | ICD-10-CM | POA: Diagnosis not present

## 2022-07-14 DIAGNOSIS — E669 Obesity, unspecified: Secondary | ICD-10-CM | POA: Diagnosis not present

## 2022-07-14 NOTE — Telephone Encounter (Signed)
Speaking with patient tomorrow at INR appt

## 2022-07-15 ENCOUNTER — Ambulatory Visit: Payer: Medicare Other | Attending: Cardiology | Admitting: *Deleted

## 2022-07-15 ENCOUNTER — Telehealth: Payer: Self-pay | Admitting: Pharmacist

## 2022-07-15 DIAGNOSIS — Z7901 Long term (current) use of anticoagulants: Secondary | ICD-10-CM | POA: Diagnosis not present

## 2022-07-15 DIAGNOSIS — I5043 Acute on chronic combined systolic (congestive) and diastolic (congestive) heart failure: Secondary | ICD-10-CM

## 2022-07-15 DIAGNOSIS — I2699 Other pulmonary embolism without acute cor pulmonale: Secondary | ICD-10-CM | POA: Diagnosis not present

## 2022-07-15 DIAGNOSIS — I5042 Chronic combined systolic (congestive) and diastolic (congestive) heart failure: Secondary | ICD-10-CM

## 2022-07-15 DIAGNOSIS — I82403 Acute embolism and thrombosis of unspecified deep veins of lower extremity, bilateral: Secondary | ICD-10-CM | POA: Insufficient documentation

## 2022-07-15 LAB — POCT INR: INR: 4.8 — AB (ref 2.0–3.0)

## 2022-07-15 MED ORDER — EMPAGLIFLOZIN 10 MG PO TABS: 10.0000 mg | ORAL_TABLET | Freq: Every day | ORAL | 11 refills | Status: DC

## 2022-07-15 NOTE — Patient Instructions (Addendum)
Description   Do not take any warfarin today and no warfarin tomorrow then START taking warfarin 1 tablet daily. Recheck INR in 2 weeks. [facility]madin Clinic [phone].

## 2022-07-15 NOTE — Telephone Encounter (Signed)
Jardiance refiill sent to pharmacy

## 2022-07-19 DIAGNOSIS — E049 Nontoxic goiter, unspecified: Secondary | ICD-10-CM | POA: Diagnosis not present

## 2022-07-20 ENCOUNTER — Ambulatory Visit (INDEPENDENT_AMBULATORY_CARE_PROVIDER_SITE_OTHER): Payer: Medicare Other | Admitting: Psychiatry

## 2022-07-20 DIAGNOSIS — F423 Hoarding disorder: Secondary | ICD-10-CM | POA: Diagnosis not present

## 2022-07-20 DIAGNOSIS — R69 Illness, unspecified: Secondary | ICD-10-CM | POA: Diagnosis not present

## 2022-07-20 DIAGNOSIS — F411 Generalized anxiety disorder: Secondary | ICD-10-CM | POA: Diagnosis not present

## 2022-07-20 DIAGNOSIS — Z604 Social exclusion and rejection: Secondary | ICD-10-CM | POA: Diagnosis not present

## 2022-07-20 DIAGNOSIS — F40298 Other specified phobia: Secondary | ICD-10-CM

## 2022-07-20 DIAGNOSIS — Z609 Problem related to social environment, unspecified: Secondary | ICD-10-CM | POA: Diagnosis not present
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
## 2022-07-25 DIAGNOSIS — E0590 Thyrotoxicosis, unspecified without thyrotoxic crisis or storm: Secondary | ICD-10-CM | POA: Diagnosis not present

## 2022-07-25 DIAGNOSIS — E049 Nontoxic goiter, unspecified: Secondary | ICD-10-CM | POA: Diagnosis not present

## 2022-07-25 DIAGNOSIS — E0520 Thyrotoxicosis with toxic multinodular goiter without thyrotoxic crisis or storm: Secondary | ICD-10-CM | POA: Diagnosis not present

## 2022-07-29 ENCOUNTER — Ambulatory Visit (INDEPENDENT_AMBULATORY_CARE_PROVIDER_SITE_OTHER): Payer: Medicare Other | Admitting: *Deleted

## 2022-07-29 ENCOUNTER — Encounter: Payer: Self-pay | Admitting: Cardiovascular Disease

## 2022-07-29 ENCOUNTER — Ambulatory Visit: Payer: Medicare Other | Attending: Cardiovascular Disease | Admitting: Cardiovascular Disease

## 2022-07-29 VITALS — BP 130/72 | HR 77 | Ht 60.0 in | Wt 219.0 lb

## 2022-07-29 DIAGNOSIS — I5043 Acute on chronic combined systolic (congestive) and diastolic (congestive) heart failure: Secondary | ICD-10-CM | POA: Diagnosis not present

## 2022-07-29 DIAGNOSIS — Z7901 Long term (current) use of anticoagulants: Secondary | ICD-10-CM | POA: Diagnosis not present

## 2022-07-29 DIAGNOSIS — I10 Essential (primary) hypertension: Secondary | ICD-10-CM | POA: Insufficient documentation

## 2022-07-29 DIAGNOSIS — I2699 Other pulmonary embolism without acute cor pulmonale: Secondary | ICD-10-CM | POA: Insufficient documentation

## 2022-07-29 DIAGNOSIS — I82403 Acute embolism and thrombosis of unspecified deep veins of lower extremity, bilateral: Secondary | ICD-10-CM

## 2022-07-29 DIAGNOSIS — I5042 Chronic combined systolic (congestive) and diastolic (congestive) heart failure: Secondary | ICD-10-CM | POA: Diagnosis not present

## 2022-07-29 LAB — POCT INR: INR: 2.7 (ref 2.0–3.0)

## 2022-07-29 NOTE — Patient Instructions (Addendum)
Medication Instructions:  Your physician recommends that you continue on your current medications as directed. Please refer to the Current Medication list given to you today.  *If you need a refill on your cardiac medications before your next appointment, please call your pharmacy*   Follow-Up: At [facility], you and your health needs are our priority.  As part of our continuing mission to provide you with exceptional heart care, we have created designated Provider Care Teams.  These Care Teams include your primary Cardiologist (physician) and Advanced Practice Providers (APPs -  Physician Assistants and Nurse Practitioners) who all work together to provide you with the care you need, when you need it.  We recommend signing up for the patient portal called "MyChart".  Sign up information is provided on this After Visit Summary.  MyChart is used to connect with patients for Virtual Visits (Telemedicine).  Patients are able to view lab/test results, encounter notes, upcoming appointments, etc.  Non-urgent messages can be sent to your provider as well.   To learn more about what you can do with MyChart, go to [email or web address].    Your next appointment:   12 month(s)  Provider:   Jonathan Berry, MD    

## 2022-07-29 NOTE — Assessment & Plan Note (Signed)
History of acute on chronic systolic and diastolic heart failure with normal coronary arteries in the past.  Her last 2D echo performed 01/31/2022 revealed EF of 40 to 45% with grade 1 diastolic dysfunction.  Echo performed 11/25/2020 revealed EF of 25 to 30% with global hypokinesia.  She is on losartan spironolactone and metoprolol.

## 2022-07-29 NOTE — Progress Notes (Signed)
07/29/2022 Nicole Gibbs   04/08/1946  [id number]  Primary Physician Nicole Dill [name], FNP Primary Cardiologist: Nicole Harp MD Nicole Gibbs, [place]  HPI:  Nicole Gibbs is a 77 y.o.    severely overweight, widowed Caucasian female (husband died 3 years ago), mother of one child (daughter Nicole Gibbs), who I last saw him in the office 01/18/2022.  She currently lives alone, goes out that dinner most nights.  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 She had normal LV systolic function and mild RV dysfunction. Ultimately, because of vaginal bleeding, she underwent a D&C with endometrial ablation by Dr. Helane Gibbs April 22, [id number], without complication. She is no longer bleeding, and her shortness of breath has improved. Her Coumadin is followed here in our office. A followup CT angiogram revealed complete resolution of her pulmonary emboli but she did have an incidentally noted multinodular goiter. Follow-up lower extremity venous Doppler study showed resolution of her DVT. She does complain of progressive dyspnea which is somewhat limiting. She has been admitted to [facility] in December and again in February with anemia and heart failure. A recent Myoview stress test showed anteroapical scar and a 2-D echo revealed an ejection fraction of 35-40% which represents this is a significant decline compared to her EF 3 years ago which was normal.. She has had a negative upper and lower endoscopy with plans to perform pill endoscopy as well. Her hemoglobin has recently remained stable. I performed outpatient cardiac catheterization on her 09/03/15 revealing normal coronary arteries with ejection fraction 35-445% consistent with a nonischemic cardiomyopathy.    She is on guideline directed optimal medical therapy although we have had difficulty with SGLT2  inhibitor such as [place] and Vania Rea because of affordability.  She walks with a cane or a walker but is minimally ambulatory and does complain of dyspnea.  Her last 2D echo performed 11/25/2020 revealed an EF of 25 to 30% with severe global hypokinesia and grade 2 diastolic dysfunction with mild pulmonary hypertension.  Since I saw her 6 months ago she is remained stable.  She denies chest pain or shortness of breath.  2D echo performed 01/31/2022 revealed slight increase in her ejection fraction up to 40 to 45% with grade 1 diastolic dysfunction.   Current Meds  Medication Sig   acetaminophen (TYLENOL) 325 MG tablet Take 650 mg by mouth every 6 (six) hours as needed for moderate pain.   empagliflozin (JARDIANCE) 10 MG TABS tablet Take 1 tablet (10 mg total) by mouth daily.   furosemide (LASIX) 40 MG tablet Take 1 tablet (40 mg total) by mouth daily.   losartan (COZAAR) 50 MG tablet Take 1 tablet by mouth once daily   medroxyPROGESTERone (PROVERA) 5 MG tablet Take 5 mg by mouth daily in the afternoon.   methimazole (TAPAZOLE) 5 MG tablet Take 5 mg by mouth daily.   metoprolol succinate (TOPROL-XL) 100 MG 24 hr tablet Take 1 tablet (100 mg total) by mouth daily. Take with or immediately following a meal.   nystatin cream (MYCOSTATIN) Apply 1 application topically 2 (two) times daily.   spironolactone (ALDACTONE) 25 MG tablet Take 1 tablet (25 mg total) by mouth daily.   warfarin (COUMADIN) 3 MG tablet TAKE 1 TABLET TO 1.5 TABLETS BY MOUTH ONCE DAILY AS DIRECTED BY COUMADIN CLINIC   zolpidem (AMBIEN) 5  MG tablet 1 tablet at bedtime.     Allergies  Allergen Reactions   Keflex [Cephalexin] Diarrhea   Sulfa Antibiotics Hives    Social History   Socioeconomic History   Marital status: Widowed    Spouse name: Not on file   Number of children: Not on file   Years of education: Not on file   Highest education level: Not on file  Occupational History   Occupation: retired  Tobacco Use    Smoking status: Never   Smokeless tobacco: Never  Substance and Sexual Activity   Alcohol use: No   Drug use: No   Sexual activity: Not on file  Other Topics Concern   Not on file  Social History Narrative   Not on file   Social Determinants of Health   Financial Resource Strain: Low Risk  (04/05/2021)   Overall Financial Resource Strain (CARDIA)    Difficulty of Paying Living Expenses: Not very hard  Food Insecurity: No Food Insecurity (04/05/2021)   Hunger Vital Sign    Worried About Running Out of Food in the Last Year: Never true    Ran Out of Food in the Last Year: Never true  Transportation Needs: No Transportation Needs (04/05/2021)   PRAPARE - Hydrologist (Medical): No    Lack of Transportation (Non-Medical): No  Physical Activity: Not on file  Stress: Not on file  Social Connections: Not on file  Intimate Partner Violence: Not on file     Review of Systems: General: negative for chills, fever, night sweats or weight changes.  Cardiovascular: negative for chest pain, dyspnea on exertion, edema, orthopnea, palpitations, paroxysmal nocturnal dyspnea or shortness of breath Dermatological: negative for rash Respiratory: negative for cough or wheezing Urologic: negative for hematuria Abdominal: negative for nausea, vomiting, diarrhea, bright red blood per rectum, melena, or hematemesis Neurologic: negative for visual changes, syncope, or dizziness All other systems reviewed and are otherwise negative except as noted above.    Blood pressure 130/72, pulse 77, height 5' (1.524 m), weight 219 lb (99.3 kg), SpO2 96 %.  General appearance: alert and no distress Neck: no adenopathy, no carotid bruit, no JVD, supple, symmetrical, trachea midline, and thyroid not enlarged, symmetric, no tenderness/mass/nodules Lungs: clear to auscultation bilaterally Heart: regular rate and rhythm, S1, S2 normal, no murmur, click, rub or gallop Extremities:  extremities normal, atraumatic, no cyanosis or edema Pulses: 2+ and symmetric Skin: Skin color, texture, turgor normal. No rashes or lesions Neurologic: Grossly normal  EKG sinus rhythm at 77 with LVH voltage with repolarization changes Nicole Gibbs reviewed this EKG.  ASSESSMENT AND PLAN:   Essential hypertension History of essential hypertension a blood pressure measured today at 130/72.  She is on losartan and metoprolol.  Acute on chronic combined systolic and diastolic congestive heart failure, NYHA class 3 (HCC) History of acute on chronic systolic and diastolic heart failure with normal coronary arteries in the past.  Her last 2D echo performed 01/31/2022 revealed EF of 40 to 45% with grade 1 diastolic dysfunction.  Echo performed 11/25/2020 revealed EF of 25 to 30% with global hypokinesia.  She is on losartan spironolactone and metoprolol.     Nicole Harp MD FACP,FACC,FAHA, [facility] 07/29/2022 3:30 PM

## 2022-07-29 NOTE — Assessment & Plan Note (Signed)
History of essential hypertension a blood pressure measured today at 130/72.  She is on losartan and metoprolol.

## 2022-07-29 NOTE — Patient Instructions (Signed)
Description   Continue taking warfarin 1 tablet daily.  Recheck INR in 3 weeks. Coumadin Clinic #[phone]     

## 2022-08-04 ENCOUNTER — Ambulatory Visit: Payer: Medicare Other | Admitting: Psychiatry

## 2022-08-19 ENCOUNTER — Ambulatory Visit (INDEPENDENT_AMBULATORY_CARE_PROVIDER_SITE_OTHER): Payer: BLUE CROSS/BLUE SHIELD | Admitting: Psychiatry

## 2022-08-19 ENCOUNTER — Ambulatory Visit: Payer: Medicare Other

## 2022-08-19 DIAGNOSIS — F423 Hoarding disorder: Secondary | ICD-10-CM

## 2022-08-19 DIAGNOSIS — Z604 Social exclusion and rejection: Secondary | ICD-10-CM

## 2022-08-19 DIAGNOSIS — R69 Illness, unspecified: Secondary | ICD-10-CM

## 2022-08-19 DIAGNOSIS — F411 Generalized anxiety disorder: Secondary | ICD-10-CM

## 2022-08-19 DIAGNOSIS — F40298 Other specified phobia: Secondary | ICD-10-CM | POA: Diagnosis not present

## 2022-08-19 DIAGNOSIS — Z609 Problem related to social environment, unspecified: Secondary | ICD-10-CM

## 2022-08-19 NOTE — Progress Notes (Signed)
Psychotherapy Progress Note [facility] [name], PhD LP  Patient ID: Nicole Gibbs (Nicole Gibbs)    MRN: [id number] Therapy format: Individual psychotherapy Date: 08/19/2022      Start: 3:05p     Stop: 3:55p     Time Spent: 50 min Location: In-person   Session narrative (presenting needs, interim history, self-report of stressors and symptoms, applications of prior therapy, status changes, and interventions made in session) [facility] phone number still blocked on her home phone.  Printed online guidance how to unblock.  Still hasn't fetched her mail in a month or more, still practicing fear of falling at the location mailboxes got moved to, and still carrying a big bag of unopened mail in her [name], not taken inside yet.  Encouraged to go ahead and move it along -- objecting to the placement of the mailbox, out of earshot of management, will not resolve her personal problem of backlogged mail, including missed bills and checks.  Will try again.  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  And has completed getting her three crowns done.  Affirmed and encouraged.  Still tempted to pick a lawyer, almost at random, to represent her in miscellaneous complaints against her [name].  Allowed that she may, but advised instead to focus, start by contacting them directly to see if certain things she thinks are issues are still issues, some several years after she last heard.  And before legal representation, see if the appropriate agency [facility] [employer]?) can help.  Otherwise, dealing with guilt over [name] death and last days.  Assured it's unrealistic, and Jesusita Oka himself would not hold against her the things she does.  Best way to honor his memory is to realistically take care of business for his widow.  Therapeutic modalities: Cognitive Behavioral Therapy, Solution-Oriented/Positive Psychology, and Ego-Supportive  Mental Status/Observations:  Appearance:    Casual     Behavior:  Appropriate enough, with sidetracking and persistent distraction to allegations of being denied rights by her [name]   Motor:  Normal and carrying cane without using  Speech/Language:   Clear and Coherent and mild pressure  Affect:  Appropriate  Mood:  anxious  Thought process:  normal  Thought content:    Obsessions  Sensory/Perceptual disturbances:    WNL  Orientation:  Fully oriented  Attention:  Good    Concentration:  Fair  Memory:  grossly intact, likely distortions  Insight:    Fair  Judgment:   Fair  Impulse Control:  Good   Risk Assessment: Danger to Self: No Self-injurious Behavior: No Danger to Others: No Physical Aggression / Violence: No Duty to Warn: No Access to Firearms a concern: No  Assessment of progress:  stabilized  Diagnosis:   ICD-10-CM   1. Generalized anxiety disorder  F41.1     2. 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 Social isolation  Z60.4     4. 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 r/o MCI  R69     6. Difficulty with community participation  Z60.9      Plan:  Clutter/life admin Try to clear any reasonable amount of paper clutter at home -- glean checks, toss trash, collect things she needs to make more complicated decisions about and reserve for when she has the initiative Continuing offer to bring a bag/box to session if it proves too difficult at home and collaborate to assess and direct what to  do Make calls as directed to find out whether expired benefit checks can be reprocessed and promptly pay accounts payable Fetch mail promptly, use vehicle if necessary Get clear from estate attorney what the work and costs are, take a second if necessary to comprehend and recall or make the call in therapy Try to let go the phantom "promise" of $10K tax refund unless willing to ask D directly to recant her filing status and ask tax preparer to work out amended return at senseless cost. Assess priorities for  further decluttering and processing Consider hiring a home care worker to help assess and move through cleanup Look into senior support services who might provide some technical help, material assistance, and/or companion working through tasks [employer] Advise fact-check issues before deciding they need fighting Recommend use [name] or a senior advocate before reporting to attorney Open offer to call [name] from therapy to find out better Security -- Contact a Designer, television/film set to check and reestablish internet security, ASAP.  Unplug computer from Internet to stop remote control of it until remedied. Health care Tend to crown needs and settle on a dentist Maintain other health issues as appropriate Cognitive Continue to assess for MCI, signs of dementia Strongly advise dropping concern for this hypothetical man, especially since he's never been sighted or encountered, and by her own admission [name] recanted the story May need permission to consult with daughter [name] for better understanding of her issues, limitations, and needs) and [name] (fact checking issues about restrictions to put up a mailbox at her door Other recommendations/advice as may be noted above Continue to utilize previously learned skills ad lib Maintain medication as prescribed and work faithfully with relevant prescriber(s) if any changes are desired or seem indicated Call the clinic on-call service, 988/hotline, 911, or present to [facility] or ER if any life-threatening psychiatric crisis Return for time as already scheduled. Already scheduled visit in this office 08/31/2022.  Robley Fries, PhD [name], PhD LP Clinical Psychologist, [facility] [address], [address] [place], [place] [address] [phone]

## 2022-08-23 ENCOUNTER — Ambulatory Visit: Payer: Medicare Other | Attending: Cardiology

## 2022-08-23 DIAGNOSIS — I82403 Acute embolism and thrombosis of unspecified deep veins of lower extremity, bilateral: Secondary | ICD-10-CM | POA: Diagnosis not present

## 2022-08-23 DIAGNOSIS — I2699 Other pulmonary embolism without acute cor pulmonale: Secondary | ICD-10-CM | POA: Diagnosis not present

## 2022-08-23 DIAGNOSIS — Z7901 Long term (current) use of anticoagulants: Secondary | ICD-10-CM | POA: Diagnosis not present

## 2022-08-23 LAB — POCT INR: INR: 2.5 (ref 2.0–3.0)

## 2022-08-23 NOTE — Patient Instructions (Signed)
Description   Continue taking warfarin 1 tablet daily.  Recheck INR in 4 weeks. Coumadin Clinic #[phone]     

## 2022-08-31 ENCOUNTER — Ambulatory Visit (INDEPENDENT_AMBULATORY_CARE_PROVIDER_SITE_OTHER): Payer: BLUE CROSS/BLUE SHIELD | Admitting: Psychiatry

## 2022-08-31 DIAGNOSIS — F40298 Other specified phobia: Secondary | ICD-10-CM | POA: Diagnosis not present

## 2022-08-31 DIAGNOSIS — R69 Illness, unspecified: Secondary | ICD-10-CM | POA: Diagnosis not present

## 2022-08-31 DIAGNOSIS — F423 Hoarding disorder: Secondary | ICD-10-CM | POA: Diagnosis not present

## 2022-08-31 DIAGNOSIS — F411 Generalized anxiety disorder: Secondary | ICD-10-CM | POA: Diagnosis not present

## 2022-08-31 DIAGNOSIS — Z609 Problem related to social environment, unspecified: Secondary | ICD-10-CM

## 2022-08-31 DIAGNOSIS — Z604 Social exclusion and rejection: Secondary | ICD-10-CM | POA: Diagnosis not present

## 2022-08-31 NOTE — Progress Notes (Signed)
Psychotherapy Progress Note [facility] [name], PhD LP  Patient ID: Nicole Maben (Lucylle)    MRN: [id number] Therapy format: Individual psychotherapy Date: 08/31/2022      Start: 3:01p     Stop: 3:50p     Time Spent: 49 min Location: In-person   Session narrative (presenting needs, interim history, self-report of stressors and symptoms, applications of prior therapy, status changes, and interventions made in session) Didn't make it to the mailbox yet, but did go to the bank Wednesday to close nephew's trust fund and try to cash an old [place] check for over $10K.  Had to make an appointment, came back Thursday,; accomplished the first and got the 2nd started (needs higher approval).  Affirmed and encouraged.  Almost through the estate business but still has to go back to the bank to get a check reissued.  Encouraged to go ahead and give herself delivery from the anxious torment of it by following through her part.    Still feels she has 4 cases to press with the [name] (the right to put up a wall mailbox, the right to access [name] meetings, right of access to the clubhouse via ramp, and what she believes was wrongful assessment sometime back for repairs she does not believe were actually her property and involve this mystery man who has been asserted and recanted).  Repeatedly urged her to un-mix the issues, and refrain from doom-prophesying what responses she'll get.  Helped to frame a rational manner to approach her [name].  Noted how Jesusita Oka had a good saying for her way of over-worrying -- "Yitzel, this is a 6-inch cross."  Encouraged she use it with herself to help prioritize and unblock herself dealing with priorities.  Therapeutic modalities: Cognitive Behavioral Therapy, Solution-Oriented/Positive Psychology, and Ego-Supportive  Mental Status/Observations:  Appearance:   Casual     Behavior:  Appropriate and generally  Motor:  Normal and with carried cane  Speech/Language:    Clear and Coherent  Affect:  Appropriate  Mood:  anxious and maybe less  Thought process:  normal  Thought content:    Obsessions  Sensory/Perceptual disturbances:    WNL  Orientation:  Fully oriented  Attention:  Good    Concentration:  Fair  Memory:  grossly intact, possible distortions  Insight:    Fair  Judgment:   Good  Impulse Control:  Good   Risk Assessment: Danger to Self: No Self-injurious Behavior: No Danger to Others: No Physical Aggression / Violence: No Duty to Warn: No Access to Firearms a concern: No  Assessment of progress:  progressing  Diagnosis:   ICD-10-CM   1. 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 Generalized anxiety disorder  F41.1     3. Social isolation  Z60.4     4. 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 r/o MCI  R69     6. Difficulty with community participation  Z60.9      Plan:  Clutter/life admin Try to clear any reasonable amount of paper clutter at home -- glean checks, toss trash, collect things she needs to make more complicated decisions about and reserve for when she has the initiative Continuing offer to bring a bag/box to session if it proves too difficult at home and collaborate to assess and direct what to do Make calls as directed to find out whether expired benefit checks can be reprocessed and promptly pay accounts payable Fetch mail promptly,  use vehicle if necessary Get clear from estate attorney what the work and costs are, take a second if necessary to comprehend and recall or make the call in therapy Try to let go the phantom "promise" of $10K tax refund unless willing to ask D directly to recant her filing status and ask tax preparer to work out amended return at senseless cost. Assess priorities for further decluttering and processing Consider hiring a home care worker to help assess and move through cleanup Look into senior support services who might provide some technical help, material assistance,  and/or companion working through tasks [employer] out for [name] issues to the point where neither she nor the [name] can respond effectively -- divide and conquer Advise fact-check issues before deciding they need fighting Recommend use [name] or a senior advocate before trying to use an attorney Open offer to call [name] from therapy to move the process of comprehending and triaging  Seek to recognize "6-inch crosses" in order to conserve energy for true challenges Security -- Contact a computer engineer/helper to check and reestablish internet security, ASAP.  Unplug computer from Internet to stop remote control of it until remedied. Cognitive Continue to assess for MCI, signs of dementia Strongly advise dropping concern for this hypothetical man, especially since he's never been sighted or encountered, and by her own admission [name] recanted the story May need permission to consult with daughter [name] for better understanding of her issues, limitations, and needs) and [name] (fact checking issues about restrictions to put up a mailbox at her door Other recommendations/advice as may be noted above Continue to utilize previously learned skills ad lib Maintain medication as prescribed and work faithfully with relevant prescriber(s) if any changes are desired or seem indicated Call the clinic on-call service, 988/hotline, 911, or present to [facility] or ER if any life-threatening psychiatric crisis Return for time as available, recommend sched ahead. Already scheduled visit in this office 10/14/2022.  Robley Fries, PhD [name], PhD LP Clinical Psychologist, [facility] [address] [place], [place] [address] [phone]

## 2022-09-05 ENCOUNTER — Other Ambulatory Visit: Payer: Self-pay | Admitting: Nurse Practitioner

## 2022-09-20 ENCOUNTER — Ambulatory Visit: Payer: Medicare Other | Attending: Cardiovascular Disease | Admitting: *Deleted

## 2022-09-20 DIAGNOSIS — I82403 Acute embolism and thrombosis of unspecified deep veins of lower extremity, bilateral: Secondary | ICD-10-CM | POA: Diagnosis not present

## 2022-09-20 DIAGNOSIS — Z7901 Long term (current) use of anticoagulants: Secondary | ICD-10-CM | POA: Diagnosis not present

## 2022-09-20 DIAGNOSIS — I2699 Other pulmonary embolism without acute cor pulmonale: Secondary | ICD-10-CM | POA: Diagnosis not present

## 2022-09-20 LAB — POCT INR: INR: 1.9 — AB (ref 2.0–3.0)

## 2022-09-20 NOTE — Patient Instructions (Signed)
Description   Take take 1.5 tablets of warfarin then continue taking warfarin 1 tablet daily. Recheck INR in 4 weeks. [facility] [phone].

## 2022-09-23 ENCOUNTER — Ambulatory Visit (INDEPENDENT_AMBULATORY_CARE_PROVIDER_SITE_OTHER): Payer: Medicare Other | Admitting: Psychiatry

## 2022-09-23 DIAGNOSIS — F40298 Other specified phobia: Secondary | ICD-10-CM | POA: Diagnosis not present

## 2022-09-23 DIAGNOSIS — F423 Hoarding disorder: Secondary | ICD-10-CM | POA: Diagnosis not present

## 2022-09-23 DIAGNOSIS — R69 Illness, unspecified: Secondary | ICD-10-CM | POA: Diagnosis not present

## 2022-09-23 DIAGNOSIS — Z609 Problem related to social environment, unspecified: Secondary | ICD-10-CM | POA: Diagnosis not present

## 2022-09-23 DIAGNOSIS — F411 Generalized anxiety disorder: Secondary | ICD-10-CM | POA: Diagnosis not present

## 2022-09-23 DIAGNOSIS — Z604 Social exclusion and rejection: Secondary | ICD-10-CM | POA: Diagnosis not present

## 2022-09-23 NOTE — Progress Notes (Signed)
Psychotherapy Progress Note [facility] [name], PhD LP  Patient ID: Nicole Bonnet ([place])    MRN: [id number] Therapy format: Individual psychotherapy Date: 09/23/2022      Start: 3:04p     Stop: 3:51p     Time Spent: 47 min Location: In-person   Session narrative (presenting needs, interim history, self-report of stressors and symptoms, applications of prior therapy, status changes, and interventions made in session) "I think I have a moral victory."  Thought she had an appt today, called here Monday, asked to be emailed (no), got herself on YouTube to see how to unblock phone numbers and succeeded!  Unfortunately, unblocked all numbers, not just ours, but open again to receive reminder calls, and knows she can re-block on a per-number basis, just don't jump the gun with [facility] calls.    Had to work through another delay Banker estate, citing a couple more destroyed checks at [employer] office.  Also unearthed a $1000 life insurance policy from 1950.  Discussed whether to add new tasks to probate process or just see if she can locate the company and put in a claim.  Awaiting 2 nephews cashing their estate checks, and then really hoping to be done with the atty Sharyne Richters) soon.  Affirmed and encouraged.  Relates family hx how parents went on vaca when she was 65 wks old, and her Aunt Hope took care of her, possibly affecting attachment and anxiety in an enduring way.  In adult life, Jesusita Oka was dx'd with MS when [name] was just 75yo and he was 75yo.  Fond memories of Jesusita Oka being thoroughly involved in [name]'s upbringing.  Relates how [name] has been torn up at times by seeing how her father died.  Discussed how Mahreen responded to her.  Says she's been in touch with [name] lately, and she knows Kady has been having trouble getting to the mailbox, now further delayed and surely mail piling up.  [name] wants to send something for M's Day but unsure if she's getting it in the  mail.  York Spaniel t be sending 8x10 of a photo [name] made visiting the [employer] home).  Tifanny confesses she has not tried going to the mailbox the pat 3 weeks, nor written her letter seeking USPS approval to deliver to her door.  Clarifies that her fear of falling at the mailbox is earned by experience, having fallen and severely broken a bone at the centralized mailbox location stumbling over a curb.  Encouraged to at least use the work-around she has developed, driving the van up to it.  Can also contract with a neighbor to fetch for her if it seems too risky, but best go ahead one way or the other instead of anxiously reworking what "ought" to be done without asking or taking steps.  Therapeutic modalities: Cognitive Behavioral Therapy and Solution-Oriented/Positive Psychology  Mental Status/Observations:  Appearance:   Casual     Behavior:  Appropriate  Motor:  Normal and exc cane, still carried, not put to the floor   Speech/Language:   Clear and Coherent  Affect:  Appropriate  Mood:  anxious  Thought process:  circumstantial  Thought content:    Worry and grievances, less  Sensory/Perceptual disturbances:    WNL  Orientation:  Fully oriented  Attention:  Good    Concentration:  Fair  Memory:  WNL  Insight:    Fair  Judgment:   Fair  Impulse Control:  Fair   Risk Assessment:  Danger to Self: No Self-injurious Behavior: No Danger to Others: No Physical Aggression / Violence: No Duty to Warn: No Access to Firearms a concern: No  Assessment of progress:  progressing  Diagnosis:   ICD-10-CM   1. 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 Generalized anxiety disorder  F41.1     3. Social isolation  Z60.4     4. 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 r/o MCI  R69     6. Difficulty with community participation  Z60.9      Plan:  Mail issue today, fetch the mail, work out one way or the other Clutter/life admin Try to clear any reasonable amount of paper  clutter at home -- glean checks, toss trash, collect things she needs to make more complicated decisions about and reserve for when she has the initiative Continuing offer to bring a bag/box to session if it proves too difficult at home and collaborate to assess and direct what to do Make calls as directed to find out whether expired benefit checks can be reprocessed and promptly pay accounts payable Fetch mail promptly, use vehicle if necessary Get clear from estate attorney what the work and costs are, take a second if necessary to comprehend and recall or make the call in therapy Try to let go the phantom "promise" of $10K tax refund unless willing to ask D directly to recant her filing status and ask tax preparer to work out amended return at senseless cost. Assess priorities for further decluttering and processing Consider hiring a home care worker to help assess and move through cleanup Look into senior support services who might provide some technical help, material assistance, and/or companion working through tasks [employer] out for [name] issues to the point where neither she nor the [name] can respond effectively -- divide and conquer Advise fact-check issues before deciding they need fighting Recommend use [name] or a senior advocate before trying to use an attorney Open offer to call [name] from therapy to move the process of comprehending and triaging  Seek to recognize "6-inch crosses" in order to conserve energy for true challenges Cognitive Continue to assess for MCI, signs of dementia Strongly advise dropping concern for this hypothetical man, especially since he's never been sighted or encountered, and by her own admission [name] recanted the story May need permission to consult with daughter [name] for better understanding of her issues, limitations, and needs) and [name] (fact checking issues about restrictions to put up a mailbox at her door Other recommendations/advice as  may be noted above Continue to utilize previously learned skills ad lib Maintain medication as prescribed and work faithfully with relevant prescriber(s) if any changes are desired or seem indicated Call the clinic on-call service, 988/hotline, 911, or present to [facility] or ER if any life-threatening psychiatric crisis Return for time as already scheduled. Already scheduled visit in this office 10/14/2022.  Robley Fries, PhD [name], PhD LP Clinical Psychologist, [facility] [address] [place], [place] [address] [phone]

## 2022-10-04 DIAGNOSIS — D649 Anemia, unspecified: Secondary | ICD-10-CM | POA: Diagnosis not present

## 2022-10-04 DIAGNOSIS — R7302 Impaired glucose tolerance (oral): Secondary | ICD-10-CM | POA: Diagnosis not present

## 2022-10-04 DIAGNOSIS — I10 Essential (primary) hypertension: Secondary | ICD-10-CM | POA: Diagnosis not present

## 2022-10-04 DIAGNOSIS — R399 Unspecified symptoms and signs involving the genitourinary system: Secondary | ICD-10-CM | POA: Diagnosis not present

## 2022-10-04 DIAGNOSIS — E049 Nontoxic goiter, unspecified: Secondary | ICD-10-CM | POA: Diagnosis not present

## 2022-10-04 DIAGNOSIS — E0590 Thyrotoxicosis, unspecified without thyrotoxic crisis or storm: Secondary | ICD-10-CM | POA: Diagnosis not present

## 2022-10-05 ENCOUNTER — Other Ambulatory Visit: Payer: Self-pay | Admitting: Cardiovascular Disease

## 2022-10-05 DIAGNOSIS — I82403 Acute embolism and thrombosis of unspecified deep veins of lower extremity, bilateral: Secondary | ICD-10-CM

## 2022-10-05 DIAGNOSIS — I2699 Other pulmonary embolism without acute cor pulmonale: Secondary | ICD-10-CM

## 2022-10-05 DIAGNOSIS — Z7901 Long term (current) use of anticoagulants: Secondary | ICD-10-CM

## 2022-10-14 ENCOUNTER — Ambulatory Visit (INDEPENDENT_AMBULATORY_CARE_PROVIDER_SITE_OTHER): Payer: Medicare Other | Admitting: Psychiatry

## 2022-10-14 DIAGNOSIS — F40298 Other specified phobia: Secondary | ICD-10-CM

## 2022-10-14 DIAGNOSIS — Z604 Social exclusion and rejection: Secondary | ICD-10-CM | POA: Diagnosis not present

## 2022-10-14 DIAGNOSIS — R69 Illness, unspecified: Secondary | ICD-10-CM | POA: Diagnosis not present

## 2022-10-14 DIAGNOSIS — I10 Essential (primary) hypertension: Secondary | ICD-10-CM | POA: Diagnosis not present

## 2022-10-14 DIAGNOSIS — F411 Generalized anxiety disorder: Secondary | ICD-10-CM

## 2022-10-14 DIAGNOSIS — F423 Hoarding disorder: Secondary | ICD-10-CM | POA: Diagnosis not present

## 2022-10-14 DIAGNOSIS — M810 Age-related osteoporosis without current pathological fracture: Secondary | ICD-10-CM | POA: Diagnosis not present

## 2022-10-14 NOTE — Progress Notes (Signed)
Psychotherapy Progress Note [facility] [name], PhD LP  Patient ID: Nicole Prentiss (Zarielle)    MRN: [id number] Therapy format: Individual psychotherapy Date: 10/14/2022      Start: 2:04p     Stop: 2:52p     Time Spent: 48 min Location: In-person   Session narrative (presenting needs, interim history, self-report of stressors and symptoms, applications of prior therapy, status changes, and interventions made in session) Received daughter's Mother's Day photo, sent through [facility], didn't have to go to the mailbox for it.  May 23, stepped out her door and found that her whole pile of backlogged mail (est 2+ months' worth) had been delivered to her door, apparently an extra-mile service by the postal worker to clear the receiving area.  Has been actively opening some items, not all yet, one of them being a notice from March that her [employer] health cost reimbursement program is folding today and will be carried on by her insuror.  Has been opening and sequestering financial messages.  2nd time in a year that postal service has brought her mail pile to her.  Has a number of un-cashed reimbursement checks dating back several years and actually has been getting notices that she hasn't cashed, they'll need to be reissued.  Again encouraged to call and ask.  Recounts that [name] nursing home surprise billed them about $5000 when she thought secondary insurance was covering.  Discussed her impulse to go to the nursing home and see what she can do to recoup, advised most likely will be her going to the insurance company to ensure they covered what they were supposed to, find out if they got erroneous billing.  As with other matters, consider whether it's worth using up today's energy on past financial neglect or focus the energy on current obligations and opportunities, like the massive amount of unclaimed reimbursement checks.  Therapeutic modalities: Cognitive Behavioral Therapy,  Solution-Oriented/Positive Psychology, and Ego-Supportive  Mental Status/Observations:  Appearance:   Casual     Behavior:  Appropriate  Motor:  Normal and exc carrying cane  Speech/Language:   Clear and Coherent  Affect:  Appropriate  Mood:  anxious  Thought process:  normal  Thought content:    WNL  Sensory/Perceptual disturbances:    WNL  Orientation:  Fully oriented  Attention:  Good    Concentration:  Fair  Memory:  WNL  Insight:    Good  Judgment:   Good  Impulse Control:  Fair   Risk Assessment: Danger to Self: No Self-injurious Behavior: No Danger to Others: No Physical Aggression / Violence: No Duty to Warn: No Access to Firearms a concern: No  Assessment of progress:  stable  Diagnosis:   ICD-10-CM   1. 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 Generalized anxiety disorder  F41.1     3. Fear of falling  F40.298     4. Social isolation  Z60.4     5. r/o MCI  R69      Plan:  Clutter/life admin Still urge to fetch new mail from mailbox -- driving to it was supposed to be good enough -- or contract with a neighbor Try to clear any reasonable amount of paper clutter at home -- glean checks, toss trash, collect things she needs to make more complicated decisions about and reserve for when she has the initiative Continuing offer to bring a bag/box to session if it proves too difficult at home and collaborate  to assess and direct what to do Make calls as directed to find out whether expired benefit checks can be reprocessed and promptly pay accounts payable Get clear from estate attorney what the work and costs are, take a second if necessary to comprehend and recall or make the call in therapy Let go the phantom "promise" of $10K tax refund unless willing to ask D directly to recant her filing status and ask tax preparer to work out amended return at senseless cost. Assess priorities for further decluttering and processing Consider hiring a home  care worker to help assess and move through cleanup Look into senior support services who might provide some technical help, material assistance, and/or companion working through tasks [name] Advise fact-check issues before deciding they need fighting Watch out for [name] issues to the point where neither she nor the [name] can respond effectively -- divide and conquer Seek to recognize "6-inch crosses" in order to conserve energy for true challenges Recommend use [name] or a senior advocate before trying to use an attorney Open offer to call [name] from therapy to move the process of comprehending and triaging  Cognitive Continue to assess for MCI, signs of dementia Strongly advise dropping concern for this hypothetical man, especially since he's never been sighted or encountered, and by her own admission [name] recanted the story May need permission to consult with daughter [name] for better understanding of her issues, limitations, and needs) and [name] (fact checking issues about restrictions to put up a mailbox at her door Other recommendations/advice -- As may be noted above.  Continue to utilize previously learned skills ad lib. Medication compliance -- Maintain medication as prescribed and work faithfully with relevant prescriber(s) if any changes are desired or seem indicated. Crisis service -- Aware of call list and work-in appts.  Call the clinic on-call service, 988/hotline, 911, or present to [facility] or ER if any life-threatening psychiatric crisis. Followup -- Return for time as already scheduled.  Next scheduled visit with me 10/26/2022.  Next scheduled in this office 10/26/2022.  Robley Fries, PhD [name], PhD LP Clinical Psychologist, [facility] [address], [address] [place], [place] [address] [phone]

## 2022-10-24 DIAGNOSIS — I5042 Chronic combined systolic (congestive) and diastolic (congestive) heart failure: Secondary | ICD-10-CM | POA: Diagnosis not present

## 2022-10-24 DIAGNOSIS — B379 Candidiasis, unspecified: Secondary | ICD-10-CM | POA: Diagnosis not present

## 2022-10-24 DIAGNOSIS — Z86711 Personal history of pulmonary embolism: Secondary | ICD-10-CM | POA: Diagnosis not present

## 2022-10-24 DIAGNOSIS — M810 Age-related osteoporosis without current pathological fracture: Secondary | ICD-10-CM | POA: Diagnosis not present

## 2022-10-24 DIAGNOSIS — N390 Urinary tract infection, site not specified: Secondary | ICD-10-CM | POA: Diagnosis not present

## 2022-10-24 DIAGNOSIS — I10 Essential (primary) hypertension: Secondary | ICD-10-CM | POA: Diagnosis not present

## 2022-10-24 DIAGNOSIS — E559 Vitamin D deficiency, unspecified: Secondary | ICD-10-CM | POA: Diagnosis not present

## 2022-10-25 ENCOUNTER — Ambulatory Visit: Payer: Medicare Other | Attending: Cardiovascular Disease | Admitting: *Deleted

## 2022-10-25 DIAGNOSIS — I2699 Other pulmonary embolism without acute cor pulmonale: Secondary | ICD-10-CM | POA: Diagnosis not present

## 2022-10-25 DIAGNOSIS — I82403 Acute embolism and thrombosis of unspecified deep veins of lower extremity, bilateral: Secondary | ICD-10-CM | POA: Insufficient documentation

## 2022-10-25 DIAGNOSIS — Z7901 Long term (current) use of anticoagulants: Secondary | ICD-10-CM | POA: Diagnosis not present

## 2022-10-25 LAB — POCT INR: INR: 1.9 — AB (ref 2.0–3.0)

## 2022-10-25 NOTE — Patient Instructions (Signed)
Description   Since you are starting a new antibiotic and you are going to call back with the new med please continue taking warfarin 1 tablet daily. Recheck INR in 4 weeks. Coumadin Clinic [phone].

## 2022-10-26 ENCOUNTER — Encounter: Payer: Self-pay | Admitting: Psychiatry

## 2022-10-26 ENCOUNTER — Ambulatory Visit (INDEPENDENT_AMBULATORY_CARE_PROVIDER_SITE_OTHER): Payer: Medicare Other | Admitting: Psychiatry

## 2022-10-26 DIAGNOSIS — Z604 Social exclusion and rejection: Secondary | ICD-10-CM | POA: Diagnosis not present

## 2022-10-26 DIAGNOSIS — F40298 Other specified phobia: Secondary | ICD-10-CM

## 2022-10-26 DIAGNOSIS — Z609 Problem related to social environment, unspecified: Secondary | ICD-10-CM | POA: Diagnosis not present

## 2022-10-26 DIAGNOSIS — F423 Hoarding disorder: Secondary | ICD-10-CM

## 2022-10-26 DIAGNOSIS — Z7189 Other specified counseling: Secondary | ICD-10-CM

## 2022-10-26 DIAGNOSIS — F411 Generalized anxiety disorder: Secondary | ICD-10-CM | POA: Diagnosis not present

## 2022-10-26 DIAGNOSIS — R69 Illness, unspecified: Secondary | ICD-10-CM

## 2022-10-26 NOTE — Progress Notes (Signed)
Psychotherapy Progress Note [facility] Nicole Czar, PhD LP  Patient ID: Nicole Gibbs (Nicole Gibbs)    MRN: [id number] Therapy format: Individual psychotherapy Date: 10/26/2022      Start: 3:16p     Stop: 4:03p     Time Spent: 47 min Location: In-person   Session narrative (presenting needs, interim history, self-report of stressors and symptoms, applications of prior therapy, status changes, and interventions made in session) Has not gone to the mailbox, just accepted the pile of mail she got at her door as a pleasant surprise 2 wks ago.  Hasn't opened any more.  Knows she got a refund from [facility], threw it in the drawer, undoubtedly other unopened "good news".  Encouraged to go ahead and process it, she'll feel better afterward, and until she carries through the appeal to get mail delivered regularly to a door-mounted mailbox, at least use the method she has to overcome fear of falling -- drive to the curb, step onto the [name] in the safety of her Nicole Gibbs, and collect normally without having to step up or down.  Failing that, contract with a neighbor or friend.  Thought she had a UTI for weeks, finally saw her primary care NP, went through 2 rounds antibiotic, then re-dx'd with yeast.  Assessed as SE of Jardiance and taken off.  Subsequently saw cardiology for a regular anticoag check, says she was told she had to be on Jardiance for her heart, and got all but scolded by the nurse.  Understandably aggravated by the dispute, and by effectively feeling more like the battleground than the patient with right of choice in her own health care.  Encouraged she fully inform cardiology of PCP concerns (available EHR notes suggest she did); then follow through reporting to cardiology as requested what "antibiotic" she's on so cardio nurse can clear it for dangerous interaction with her anticoagulant.  Knowing she could easily table the task, arranged to call her pharmacy in session to recover the name  and then to message cardiology nurse (via [facility] staff message), with consent.  (Fluconazole 150mg  5-day supply.)  Says primary care has "threatened" to d/c Ambien, presumably b/c it's become habit-forming, but actually she's been splitting a low dose, approximately 2.5mg  QHS + 2.5 on waking middle of the night.  EHR notation made, and informed her that based on ample secondhand experience with psychiatry decisions, managing Ambien sounds like a much lower priority than supporting adequate sleep and working up better habits managing her lifestyle while awake.  Therapeutic modalities: Cognitive Behavioral Therapy, Solution-Oriented/Positive Psychology, Ego-Supportive, and practical  Mental Status/Observations:  Appearance:   Casual     Behavior:  Appropriate  Motor:  Cane, mostly carried  Speech/Language:   Clear and Coherent  Affect:  Appropriate  Mood:  anxious and less  Thought process:  normal  Thought content:    worrisome  Sensory/Perceptual disturbances:    WNL  Orientation:  Fully oriented  Attention:  Good    Concentration:  Fair  Memory:  grossly intact  Insight:    Fair  Judgment:   Good  Impulse Control:  Fair   Risk Assessment: Danger to Self: No Self-injurious Behavior: No Danger to Others: No Physical Aggression / Violence: No Duty to Warn: No Access to Firearms a concern: No  Assessment of progress:  stabilized  Diagnosis:   ICD-10-CM   1. Generalized anxiety disorder  F41.1     2. 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 Social isolation  Z60.4     4. 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 r/o MCI  R69     6. Difficulty with community participation  Z60.9     7. Counseling for conflicting medical advice  Z71.89      Plan:  Nicole Gibbs for primary care to confer about care Clutter/life admin Still urge to fetch new mail from mailbox -- driving to it was supposed to be good enough -- or contract with a neighbor Try to clear any reasonable  amount of paper clutter at home -- glean checks, toss trash, collect things she needs to make more complicated decisions about and reserve for when she has the initiative Continuing offer to bring a bag/box to session if it proves too difficult at home and collaborate to assess and direct what to do Make calls as directed to find out whether expired benefit checks can be reprocessed and promptly pay accounts payable Get clear from estate attorney what the work and costs are, take a second if necessary to comprehend and recall or make the call in therapy Let go the phantom "promise" of $10K tax refund unless willing to ask D directly to recant her filing status and ask tax preparer to work out amended return at senseless cost. Assess priorities for further decluttering and processing Consider hiring a home care worker to help assess and move through cleanup Look into senior support services who might provide some technical help, material assistance, and/or companion working through tasks [name] Advise fact-check issues before deciding they need fighting Watch out for [name] issues to the point where neither she nor the [name] can respond effectively -- divide and conquer Seek to recognize "6-inch crosses" in order to conserve energy for true challenges Recommend use [name] or a senior advocate before trying to use an attorney Open offer to call [name] from therapy to move the process of comprehending and triaging  Cognitive Continue to assess for MCI, signs of dementia Strongly advise dropping concern for this hypothetical man, especially since he's never been sighted or encountered, and by her own admission [name] recanted the story May need permission to consult with daughter Nicole Gibbs for better understanding of her issues, limitations, and needs) and [name] (fact checking issues about restrictions to put up a mailbox at her door Other recommendations/advice -- As may be noted above.  Continue to  utilize previously learned skills ad lib. Medication compliance -- Maintain medication as prescribed and work faithfully with relevant prescriber(s) if any changes are desired or seem indicated. Crisis service -- Aware of call list and work-in appts.  Call the clinic on-call service, 988/hotline, 911, or present to [facility] or ER if any life-threatening psychiatric crisis. Followup -- Return for time as already scheduled.  Next scheduled visit with me 11/09/2022.  Next scheduled in this office 11/09/2022.  Robley Fries, PhD Nicole Czar, PhD LP Clinical Psychologist, [facility] [address] [place], [place] [address] [phone]

## 2022-10-28 ENCOUNTER — Ambulatory Visit: Payer: Medicare Other | Attending: Cardiovascular Disease

## 2022-10-28 DIAGNOSIS — Z7901 Long term (current) use of anticoagulants: Secondary | ICD-10-CM | POA: Diagnosis not present

## 2022-10-28 DIAGNOSIS — I82403 Acute embolism and thrombosis of unspecified deep veins of lower extremity, bilateral: Secondary | ICD-10-CM | POA: Insufficient documentation

## 2022-10-28 LAB — POCT INR: INR: 2.4 (ref 2.0–3.0)

## 2022-10-28 NOTE — Patient Instructions (Addendum)
Description   Since you are on Fluconazole, only take 1/2 tablet today and then continue taking warfarin 1 tablet daily.  Recheck INR on Thursday  [facility] [phone].

## 2022-11-01 DIAGNOSIS — B3731 Acute candidiasis of vulva and vagina: Secondary | ICD-10-CM | POA: Diagnosis not present

## 2022-11-01 DIAGNOSIS — N390 Urinary tract infection, site not specified: Secondary | ICD-10-CM | POA: Diagnosis not present

## 2022-11-01 DIAGNOSIS — I5042 Chronic combined systolic (congestive) and diastolic (congestive) heart failure: Secondary | ICD-10-CM | POA: Diagnosis not present

## 2022-11-03 ENCOUNTER — Ambulatory Visit: Payer: Medicare Other | Attending: Cardiovascular Disease | Admitting: *Deleted

## 2022-11-03 DIAGNOSIS — I82403 Acute embolism and thrombosis of unspecified deep veins of lower extremity, bilateral: Secondary | ICD-10-CM | POA: Diagnosis not present

## 2022-11-03 DIAGNOSIS — I2699 Other pulmonary embolism without acute cor pulmonale: Secondary | ICD-10-CM | POA: Diagnosis not present

## 2022-11-03 DIAGNOSIS — Z7901 Long term (current) use of anticoagulants: Secondary | ICD-10-CM | POA: Diagnosis not present

## 2022-11-03 LAB — POCT INR: INR: 3.2 — AB (ref 2.0–3.0)

## 2022-11-03 NOTE — Patient Instructions (Signed)
Description   Today take 1/2 tablet of warfarin then continue taking warfarin 1 tablet daily.  Recheck INR in 1 week. [facility]madin Clinic [phone].

## 2022-11-09 ENCOUNTER — Ambulatory Visit (INDEPENDENT_AMBULATORY_CARE_PROVIDER_SITE_OTHER): Payer: Medicare Other | Admitting: Psychiatry

## 2022-11-09 DIAGNOSIS — F40298 Other specified phobia: Secondary | ICD-10-CM

## 2022-11-09 DIAGNOSIS — Z604 Social exclusion and rejection: Secondary | ICD-10-CM

## 2022-11-09 DIAGNOSIS — F411 Generalized anxiety disorder: Secondary | ICD-10-CM

## 2022-11-09 DIAGNOSIS — E678 Other specified hyperalimentation: Secondary | ICD-10-CM

## 2022-11-09 DIAGNOSIS — F423 Hoarding disorder: Secondary | ICD-10-CM | POA: Diagnosis not present

## 2022-11-09 NOTE — Progress Notes (Signed)
Psychotherapy Progress Note [facility] [name], PhD LP  Patient ID: Jagger Beahm (Kristianna)    MRN: [id number] Therapy format: Individual psychotherapy Date: 11/09/2022      Start: 2:10p     Stop: 2:55p     Time Spent: 45 min Location: In-person   Session narrative (presenting needs, interim history, self-report of stressors and symptoms, applications of prior therapy, status changes, and interventions made in session) Approached her PCP and cardiology about reconciling Jardiance therapy.  PCP was humble about her rank vs. cardiologist and deeply apologetic about the prospect of Dianne having to manage fungal infection risk indefinitely.  Did confirm with cardio that fluconazole would adversely affect anticoagulant therapy, and in fact her last INR results are slightly high but not bad.  Otherwise, trying to follow recommendations to keep dry, despite diuretic.  Old advice to hold down her fluid intake (?) to 4 cups max, understood to be for benefit of congestive heart issues, but that was maybe 2016, pre-diuretic.  Encouraged clarify advice, as it may have changed, and pretty much everyone needs sufficient fluid for circulation and kidney function.    F/u to Ambien, did talk with her PCP about it, and about feeling intimidated or "threatened" to have it taken away.  Assured "I've never said no to you, Royanne."  So apparently clinically OK to maintain low dose sleep aid.   Otherwise, admittedly has a sugar problem, largely cookies and ice cream, episodic shakes.  Encouraged to pare down sugar intake, so that she can better control yeast risk, be kinder to her heart and blood vessels, help herself with weight, and bing down anxiety intensity all at once.  Still wants her birthday reward shake at [employer] today, of course, but understands.    Admits she gets intimidated with all doctor appts but acknowledges it's calmed down here.  Affirmed good work representing herself as a  patient to reconcile conflicting and misunderstood advice with her medical providers.  Re. mailbox, still no attempt to fetch her mail, despite knowing she has birthday cards from 3 wks ago and good chance of checks.  Did bring 2 pieces of procrastinated mail with her from the May 23 delivery.  Fostered and opening them here -- 1 an unsolicited home-buy offer (junk), the other a tax document form her mother's bank.  Has July 10 meeting with financial advisor Saundra Shelling), will ask then what to do with the IRA.  Meanwhile, instructed to ask the estate attorney, however unwanted, if she needs the document for final estate income tax.  Worked through hasty reading and conclusion-jumping, coaching her to read what's in front of her, not go with what her fears tell her it means (e.g., a tax bill, threat of owing something or delinquent filing).    Therapeutic modalities: Cognitive Behavioral Therapy and Solution-Oriented/Positive Psychology  Mental Status/Observations:  Appearance:   Casual     Behavior:  Appropriate  Motor:  cane  Speech/Language:   Clear and Coherent  Affect:  Appropriate  Mood:  anxious and less  Thought process:  normal  Thought content:    WNL  Sensory/Perceptual disturbances:    WNL  Orientation:  Fully oriented  Attention:  Good    Concentration:  Good  Memory:  WNL  Insight:    Good  Judgment:   Good  Impulse Control:  Fair   Risk Assessment: Danger to Self: No Self-injurious Behavior: No Danger to Others: No Physical Aggression / Violence: No Duty to  Warn: No Access to Firearms a concern: No  Assessment of progress:  progressing  Diagnosis:   ICD-10-CM   1. Generalized anxiety disorder  F41.1     2. 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 Social isolation  Z60.4     4. 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 Excessive carbohydrate intake  E67.8      Plan:  Clutter/life admin Still urge to fetch new mail from mailbox -- driving to it was  supposed to be good enough -- or contract with a neighbor Try to clear any reasonable amount of paper clutter at home -- glean checks, toss trash, collect things she needs to make more complicated decisions about and reserve for when she has the initiative Continuing offer to bring a bag/box to session if it proves too difficult at home and collaborate to assess and direct what to do Make calls as directed to find out whether expired benefit checks can be reprocessed and promptly pay accounts payable Get clear from estate attorney what the work and costs are, take a second if necessary to comprehend and recall or make the call in therapy Let go the phantom "promise" of $10K tax refund unless willing to ask D directly to recant her filing status and ask tax preparer to work out amended return at senseless cost. Assess priorities for further decluttering and processing Consider hiring a home care worker to help assess and move through cleanup Look into senior support services who might provide some technical help, material assistance, and/or companion working through tasks [name] Advise fact-check issues before deciding they need fighting Watch out for [name] issues to the point where neither she nor the [name] can respond effectively -- divide and conquer Seek to recognize any "6-inch crosses" she might take up, conserve energy for true challenges Recommend use [name] or a senior advocate before trying to use an attorney Open offer to call [name] from therapy to move the process of comprehending and triaging  Cognitive Continue to assess for MCI, signs of dementia Strongly advise dropping concern for this hypothetical man, especially since he's never been sighted or encountered, and by her own admission [name] recanted the story May need permission to consult with daughter [name] for better understanding of her issues, limitations, and needs) and [name] (fact checking issues about restrictions to put  up a mailbox at her door Health Continue to reconcile medical advice actively when needed Encourage attention to reducing carbs for general health and help with emotional regulation Other recommendations/advice -- As may be noted above.  Continue to utilize previously learned skills ad lib. Medication compliance -- Maintain medication as prescribed and work faithfully with relevant prescriber(s) if any changes are desired or seem indicated. Crisis service -- Aware of call list and work-in appts.  Call the clinic on-call service, 988/hotline, 911, or present to [facility] or ER if any life-threatening psychiatric crisis. Followup -- Return for time as already scheduled.  Next scheduled visit with me 11/28/2022.  Next scheduled in this office 11/28/2022.  Robley Fries, PhD [name], PhD LP Clinical Psychologist, [facility] [address], [address] [place], [place] [address] [phone]

## 2022-11-16 ENCOUNTER — Ambulatory Visit: Payer: Medicare Other

## 2022-11-22 ENCOUNTER — Other Ambulatory Visit: Payer: Self-pay

## 2022-11-22 ENCOUNTER — Ambulatory Visit: Payer: Medicare Other | Attending: Cardiovascular Disease

## 2022-11-22 DIAGNOSIS — Z7901 Long term (current) use of anticoagulants: Secondary | ICD-10-CM | POA: Diagnosis not present

## 2022-11-22 DIAGNOSIS — I82403 Acute embolism and thrombosis of unspecified deep veins of lower extremity, bilateral: Secondary | ICD-10-CM | POA: Insufficient documentation

## 2022-11-22 DIAGNOSIS — I2699 Other pulmonary embolism without acute cor pulmonale: Secondary | ICD-10-CM | POA: Diagnosis not present

## 2022-11-22 LAB — POCT INR: INR: 3 (ref 2.0–3.0)

## 2022-11-22 MED ORDER — SPIRONOLACTONE 25 MG PO TABS: 25.0000 mg | ORAL_TABLET | Freq: Every day | ORAL | 1 refills | Status: DC

## 2022-11-22 NOTE — Patient Instructions (Signed)
Description   Continue on same dosage of Warfarin 1 tablet daily.  Recheck INR in 3 weeks. Coumadin Clinic [phone].

## 2022-11-22 NOTE — Telephone Encounter (Signed)
Pt seen in Coumadin Clinic today, requesting refill of spironolactone sent to [facility] at [address] and [address].

## 2022-11-28 ENCOUNTER — Ambulatory Visit (INDEPENDENT_AMBULATORY_CARE_PROVIDER_SITE_OTHER): Payer: Medicare Other | Admitting: Psychiatry

## 2022-11-28 DIAGNOSIS — Z604 Social exclusion and rejection: Secondary | ICD-10-CM

## 2022-11-28 DIAGNOSIS — Z9189 Other specified personal risk factors, not elsewhere classified: Secondary | ICD-10-CM

## 2022-11-28 DIAGNOSIS — F423 Hoarding disorder: Secondary | ICD-10-CM

## 2022-11-28 DIAGNOSIS — R69 Illness, unspecified: Secondary | ICD-10-CM | POA: Diagnosis not present

## 2022-11-28 DIAGNOSIS — F411 Generalized anxiety disorder: Secondary | ICD-10-CM | POA: Diagnosis not present
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
## 2022-11-28 NOTE — Progress Notes (Incomplete)
Psychotherapy Progress Note [facility] [name], PhD LP  Patient ID: Juleidy Singelton ([place])    MRN: [id number] Therapy format: Individual psychotherapy Date: 11/09/2022      Start: 2:10p     Stop: 2:55p     Time Spent: 45 min Location: In-person   Session narrative (presenting needs, interim history, self-report of stressors and symptoms, applications of prior therapy, status changes, and interventions made in session) Approached her PCP and cardiology about reconciling Jardiance therapy.  PCP was humble about her rank vs. cardiologist and deeply apologetic about the prospect of Dianne having to manage fungal infection risk indefinitely.  Did confirm with cardio that fluconazole would adversely affect anticoagulant therapy, and in fact her last INR results are slightly high but not bad.  Otherwise, trying to follow recommendations to keep dry, despite diuretic.  Old advice to hold down her fluid intake (?) to 4 cups max, understood to be for benefit of congestive heart issues, but that was maybe 2016, pre-diuretic.  Encouraged clarify advice, as it may have changed, and pretty much everyone needs sufficient fluid for circulation and kidney function.    F/u to Ambien, did talk with her PCP about it, and about feeling intimidated or "threatened" to have it taken away.  Assured "I've never said no to you, Lucilla."  So apparently clinically OK to maintain low dose sleep aid.   Otherwise, admittedly has a sugar problem, largely cookies and ice cream, episodic shakes.  Encouraged to pare down sugar intake, so that she can better control yeast risk, be kinder to her heart and blood vessels, help herself with weight, and bing down anxiety intensity all at once.  Still wants her birthday reward shake at [employer] today, of course, but understands.    Admits she gets intimidated with all doctor appts but acknoweldges it's calmed down here.  Affirmed good work representing herself as a  patient to reconcile conflicting and misunderstood advice.   Re. mailbox, still no attempt to fetch her mail, despite knowing she has birthday cards from 3 wks ago and good chance of chcecks.  Did bring 2 pieces of procrastinated mail with her from the May 23 delivery.  Fostered and opening them here -- 1 an unsolicited home-buy offer (junk), the other a tax document form her mother's bank.  Has July 10 meeting with financial advisor Saundra Shelling), will ask then what to do with the IRA.  Meanwhile, instructed to ask the estate attorney, however unwanted, if she needs the document for final estate income tax.  Worked through hasty reading and conclusion-jumping, coaching her to read what's in front of her, not go with what her fears tell her it means (e.g., a tax bill, threat of owing something or delinquent filing).    Therapeutic modalities: {AM:23362::"Cognitive Behavioral Therapy","Solution-Oriented/Positive Psychology"}  Mental Status/Observations:  Appearance:   {PSY:22683}     Behavior:  {PSY:21022743}  Motor:  {PSY:22302}  Speech/Language:   {PSY:22685}  Affect:  {PSY:22687}  Mood:  {PSY:31886}  Thought process:  {PSY:31888}  Thought content:    {[phone]}  Sensory/Perceptual disturbances:    {PSY:[phone]}  Orientation:  {Psych Orientation:23301::"Fully oriented"}  Attention:  {Good-Fair-Poor ratings:23770::"Good"}    Concentration:  {Good-Fair-Poor ratings:23770::"Good"}  Memory:  {PSY:[phone]}  Insight:    {Good-Fair-Poor ratings:23770::"Good"}  Judgment:   {Good-Fair-Poor ratings:23770::"Good"}  Impulse Control:  {Good-Fair-Poor ratings:23770::"Good"}   Risk Assessment: Danger to Self: {Risk:22599::"No"} Self-injurious Behavior: {Risk:22599::"No"} Danger to Others: {Risk:22599::"No"} Physical Aggression / Violence: {Risk:22599::"No"} Duty to Warn: {AMYesNo:22526::"No"} Access  to Firearms a concern: {AMYesNo:22526::"No"}  Assessment of progress:   {Progress:22147::"progressing"}  Diagnosis: No diagnosis found. Plan:  [name] for primary care to confer about care Clutter/life admin Still urge to fetch new mail from mailbox -- driving to it was supposed to be good enough -- or contract with a neighbor Try to clear any reasonable amount of paper clutter at home -- glean checks, toss trash, collect things she needs to make more complicated decisions about and reserve for when she has the initiative Continuing offer to bring a bag/box to session if it proves too difficult at home and collaborate to assess and direct what to do Make calls as directed to find out whether expired benefit checks can be reprocessed and promptly pay accounts payable Get clear from estate attorney what the work and costs are, take a second if necessary to comprehend and recall or make the call in therapy Let go the phantom "promise" of $10K tax refund unless willing to ask D directly to recant her filing status and ask tax preparer to work out amended return at senseless cost. Assess priorities for further decluttering and processing Consider hiring a home care worker to help assess and move through cleanup Look into senior support services who might provide some technical help, material assistance, and/or companion working through tasks [name] Advise fact-check issues before deciding they need fighting Watch out for [name] issues to the point where neither she nor the [name] can respond effectively -- divide and conquer Seek to recognize any "6-inch crosses" she might take up, conserve energy for true challenges Recommend use [name] or a senior advocate before trying to use an attorney Open offer to call [name] from therapy to move the process of comprehending and triaging  Cognitive Continue to assess for MCI, signs of dementia Strongly advise dropping concern for this hypothetical man, especially since he's never been sighted or encountered, and by  her own admission [name] recanted the story May need permission to consult with daughter [name] for better understanding of her issues, limitations, and needs) and [name] (fact checking issues about restrictions to put up a mailbox at her door Other recommendations/advice -- As may be noted above.  Continue to utilize previously learned skills ad lib. Medication compliance -- Maintain medication as prescribed and work faithfully with relevant prescriber(s) if any changes are desired or seem indicated. Crisis service -- Aware of call list and work-in appts.  Call the clinic on-call service, 988/hotline, 911, or present to [facility] or ER if any life-threatening psychiatric crisis. Followup -- Return for time as already scheduled.  Next scheduled visit with me 11/28/2022.  Next scheduled in this office 11/28/2022.  Robley Fries, PhD [name], PhD LP Clinical Psychologist, [facility] [address] [place], [place] [address] [phone]

## 2022-12-05 DIAGNOSIS — I10 Essential (primary) hypertension: Secondary | ICD-10-CM | POA: Diagnosis not present

## 2022-12-05 DIAGNOSIS — R7302 Impaired glucose tolerance (oral): Secondary | ICD-10-CM | POA: Diagnosis not present

## 2022-12-05 DIAGNOSIS — D649 Anemia, unspecified: Secondary | ICD-10-CM | POA: Diagnosis not present

## 2022-12-05 DIAGNOSIS — E049 Nontoxic goiter, unspecified: Secondary | ICD-10-CM | POA: Diagnosis not present

## 2022-12-05 DIAGNOSIS — E0590 Thyrotoxicosis, unspecified without thyrotoxic crisis or storm: Secondary | ICD-10-CM | POA: Diagnosis not present

## 2022-12-13 ENCOUNTER — Ambulatory Visit: Payer: Medicare Other | Attending: Cardiovascular Disease

## 2022-12-13 DIAGNOSIS — Z7901 Long term (current) use of anticoagulants: Secondary | ICD-10-CM | POA: Insufficient documentation

## 2022-12-13 DIAGNOSIS — I82403 Acute embolism and thrombosis of unspecified deep veins of lower extremity, bilateral: Secondary | ICD-10-CM | POA: Insufficient documentation

## 2022-12-13 LAB — POCT INR: INR: 2.6 (ref 2.0–3.0)

## 2022-12-13 NOTE — Patient Instructions (Signed)
Continue on same dosage of Warfarin 1 tablet daily.  Recheck INR in 5 weeks. Coumadin Clinic [phone].

## 2022-12-15 DIAGNOSIS — E0590 Thyrotoxicosis, unspecified without thyrotoxic crisis or storm: Secondary | ICD-10-CM | POA: Diagnosis not present

## 2022-12-15 DIAGNOSIS — E049 Nontoxic goiter, unspecified: Secondary | ICD-10-CM | POA: Diagnosis not present

## 2022-12-16 ENCOUNTER — Ambulatory Visit (INDEPENDENT_AMBULATORY_CARE_PROVIDER_SITE_OTHER): Payer: Medicare Other | Admitting: Psychiatry

## 2022-12-16 DIAGNOSIS — Z609 Problem related to social environment, unspecified: Secondary | ICD-10-CM | POA: Diagnosis not present

## 2022-12-16 DIAGNOSIS — Z604 Social exclusion and rejection: Secondary | ICD-10-CM | POA: Diagnosis not present

## 2022-12-16 DIAGNOSIS — F411 Generalized anxiety disorder: Secondary | ICD-10-CM | POA: Diagnosis not present

## 2022-12-16 DIAGNOSIS — Z9189 Other specified personal risk factors, not elsewhere classified: Secondary | ICD-10-CM | POA: Diagnosis not present

## 2022-12-16 DIAGNOSIS — R69 Illness, unspecified: Secondary | ICD-10-CM | POA: Diagnosis not present

## 2022-12-16 DIAGNOSIS — F40298 Other specified phobia: Secondary | ICD-10-CM

## 2022-12-16 DIAGNOSIS — F423 Hoarding disorder: Secondary | ICD-10-CM | POA: Diagnosis not present
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
## 2022-12-29 ENCOUNTER — Ambulatory Visit: Payer: Medicare Other | Admitting: Psychiatry

## 2023-01-11 ENCOUNTER — Ambulatory Visit (INDEPENDENT_AMBULATORY_CARE_PROVIDER_SITE_OTHER): Payer: Medicare Other | Admitting: Psychiatry

## 2023-01-11 DIAGNOSIS — F423 Hoarding disorder: Secondary | ICD-10-CM

## 2023-01-11 DIAGNOSIS — F40298 Other specified phobia: Secondary | ICD-10-CM

## 2023-01-11 DIAGNOSIS — Z9189 Other specified personal risk factors, not elsewhere classified: Secondary | ICD-10-CM

## 2023-01-11 DIAGNOSIS — Z609 Problem related to social environment, unspecified: Secondary | ICD-10-CM | POA: Diagnosis not present

## 2023-01-11 DIAGNOSIS — F411 Generalized anxiety disorder: Secondary | ICD-10-CM

## 2023-01-11 DIAGNOSIS — Z604 Social exclusion and rejection: Secondary | ICD-10-CM
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
## 2023-01-17 ENCOUNTER — Ambulatory Visit: Payer: Medicare Other | Attending: Cardiology | Admitting: *Deleted

## 2023-01-17 DIAGNOSIS — Z7901 Long term (current) use of anticoagulants: Secondary | ICD-10-CM | POA: Insufficient documentation

## 2023-01-17 DIAGNOSIS — I82403 Acute embolism and thrombosis of unspecified deep veins of lower extremity, bilateral: Secondary | ICD-10-CM | POA: Insufficient documentation

## 2023-01-17 LAB — POCT INR: INR: 2.6 (ref 2.0–3.0)

## 2023-01-17 NOTE — Patient Instructions (Signed)
Description   Continue taking Warfarin 1 tablet daily.  Recheck INR in 6 weeks.  Coumadin Clinic [phone]      

## 2023-01-25 ENCOUNTER — Ambulatory Visit (INDEPENDENT_AMBULATORY_CARE_PROVIDER_SITE_OTHER): Payer: Medicare Other | Admitting: Psychiatry

## 2023-01-25 DIAGNOSIS — Z604 Social exclusion and rejection: Secondary | ICD-10-CM | POA: Diagnosis not present

## 2023-01-25 DIAGNOSIS — Z9189 Other specified personal risk factors, not elsewhere classified: Secondary | ICD-10-CM | POA: Diagnosis not present

## 2023-01-25 DIAGNOSIS — F411 Generalized anxiety disorder: Secondary | ICD-10-CM | POA: Diagnosis not present

## 2023-01-25 DIAGNOSIS — F40298 Other specified phobia: Secondary | ICD-10-CM | POA: Diagnosis not present

## 2023-01-25 DIAGNOSIS — F423 Hoarding disorder: Secondary | ICD-10-CM | POA: Diagnosis not present

## 2023-01-25 DIAGNOSIS — Z609 Problem related to social environment, unspecified: Secondary | ICD-10-CM
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
## 2023-02-09 ENCOUNTER — Ambulatory Visit (INDEPENDENT_AMBULATORY_CARE_PROVIDER_SITE_OTHER): Payer: Medicare Other | Admitting: Psychiatry

## 2023-02-09 DIAGNOSIS — F40298 Other specified phobia: Secondary | ICD-10-CM

## 2023-02-09 DIAGNOSIS — Z609 Problem related to social environment, unspecified: Secondary | ICD-10-CM

## 2023-02-09 DIAGNOSIS — F423 Hoarding disorder: Secondary | ICD-10-CM | POA: Diagnosis not present

## 2023-02-09 DIAGNOSIS — F411 Generalized anxiety disorder: Secondary | ICD-10-CM

## 2023-02-09 DIAGNOSIS — Z604 Social exclusion and rejection: Secondary | ICD-10-CM
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
## 2023-02-16 ENCOUNTER — Ambulatory Visit: Payer: Medicare Other | Admitting: Psychiatry

## 2023-02-23 ENCOUNTER — Ambulatory Visit (INDEPENDENT_AMBULATORY_CARE_PROVIDER_SITE_OTHER): Payer: Medicare Other | Admitting: Psychiatry

## 2023-02-23 DIAGNOSIS — F40298 Other specified phobia: Secondary | ICD-10-CM | POA: Diagnosis not present

## 2023-02-23 DIAGNOSIS — Z9189 Other specified personal risk factors, not elsewhere classified: Secondary | ICD-10-CM

## 2023-02-23 DIAGNOSIS — Z604 Social exclusion and rejection: Secondary | ICD-10-CM | POA: Diagnosis not present

## 2023-02-23 DIAGNOSIS — R69 Illness, unspecified: Secondary | ICD-10-CM

## 2023-02-23 DIAGNOSIS — F423 Hoarding disorder: Secondary | ICD-10-CM

## 2023-02-23 DIAGNOSIS — F411 Generalized anxiety disorder: Secondary | ICD-10-CM

## 2023-02-23 DIAGNOSIS — Z609 Problem related to social environment, unspecified: Secondary | ICD-10-CM | POA: Diagnosis not present
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
## 2023-02-26 DIAGNOSIS — Z23 Encounter for immunization: Secondary | ICD-10-CM | POA: Diagnosis not present

## 2023-03-03 ENCOUNTER — Ambulatory Visit: Payer: Medicare Other | Attending: Internal Medicine

## 2023-03-03 DIAGNOSIS — I2699 Other pulmonary embolism without acute cor pulmonale: Secondary | ICD-10-CM | POA: Diagnosis not present

## 2023-03-03 DIAGNOSIS — I82403 Acute embolism and thrombosis of unspecified deep veins of lower extremity, bilateral: Secondary | ICD-10-CM | POA: Diagnosis not present

## 2023-03-03 DIAGNOSIS — Z7901 Long term (current) use of anticoagulants: Secondary | ICD-10-CM | POA: Diagnosis not present

## 2023-03-03 LAB — POCT INR: INR: 2.8 (ref 2.0–3.0)

## 2023-03-03 MED ORDER — WARFARIN SODIUM 3 MG PO TABS: ORAL_TABLET | ORAL | 3 refills | Status: DC

## 2023-03-03 NOTE — Patient Instructions (Signed)
Continue taking Warfarin 1 tablet daily.  Recheck INR in 6 weeks. [facility] [phone].

## 2023-03-06 ENCOUNTER — Other Ambulatory Visit: Payer: Self-pay | Admitting: Cardiovascular Disease

## 2023-03-09 ENCOUNTER — Ambulatory Visit (INDEPENDENT_AMBULATORY_CARE_PROVIDER_SITE_OTHER): Payer: Medicare Other | Admitting: Psychiatry

## 2023-03-09 DIAGNOSIS — Z604 Social exclusion and rejection: Secondary | ICD-10-CM | POA: Diagnosis not present

## 2023-03-09 DIAGNOSIS — F40298 Other specified phobia: Secondary | ICD-10-CM | POA: Diagnosis not present

## 2023-03-09 DIAGNOSIS — F411 Generalized anxiety disorder: Secondary | ICD-10-CM

## 2023-03-09 DIAGNOSIS — R69 Illness, unspecified: Secondary | ICD-10-CM

## 2023-03-09 DIAGNOSIS — F423 Hoarding disorder: Secondary | ICD-10-CM | POA: Diagnosis not present

## 2023-03-09 NOTE — Progress Notes (Signed)
 Psychotherapy Progress Note [facility] Nicole Ferry, PhD LP  Patient ID: Nicole Gibbs (Stewart)    MRN: [id number] Therapy format: Individual psychotherapy Date: 03/09/2023      Start: 3:20p     Stop: 4:05p     Time Spent: 45 min Location: In-person   Session narrative (presenting needs, interim history, self-report of stressors and symptoms, applications of prior therapy, status changes, and interventions made in session) Concerns for voting, has questions about her sample ballot.  Assisted looking up her voter info.  Just had a sleep study, focused on the cost ($14K?! -- may be face price, not adjusted by insurance), and no identifiable results.  EHR shows a sleep apnea dx and refusal to use CPAP, so unclear.  Latest sleep study results look negative, as did the initial one in 2020, plus she is averse to wearing CPAP anyway.  C/o only being allowed 5mg  Ambien , where she used to be allowed 10mg .  Attempted to educate her on medical thinking about dependency but hard to reach.  Sleep study readings did show some reduced O2, but that was also before she improved CHF (had had just 20-25% EF, which improved to 40-45% as of a year ago).  Affirmed good work recovering heart function and encouraged to talk over Ambien  dosing with her provider but expect the reluctance to be about dependency.  New computer issue -- Bought a new machine, had [employer] come in, they found out it was only her mouse that was not operating (battery?), and so they advised her it's overkill to get a new machine.  Hired them for a security cleanup instead, and now satisfied her machine is back to normal and she can use it again.  Affirmed working through that and Ecologist and direction.  Re family, has decided to have a Christmas plan with daughter Nicole Gibbs.  No longer looking at [place] -- she had wanted to visit the pandas, but they won't be available -- now going to [facility], and she expects to be  able to drive herself there while Nicole Gibbs flies in.  Discussed at some length the condition of her Nicole Gibbs and herself as a driver, as well as Nicole Gibbs's preferences and what she knows.  Ultimately granted she has the right to try it but it would be wise to rent a more comfortable and reliable car for that kind of trip.  Therapeutic modalities: Cognitive Behavioral Therapy, Solution-Oriented/Positive Psychology, and practical problem-solving  Mental Status/Observations:  Appearance:   Casual     Behavior:  Appropriate  Motor:  Normal and except gait, cane  Speech/Language:   Clear and Coherent  Affect:  Appropriate  Mood:  Less anxious  Thought process:  circumstantial  Thought content:    Some tangent-taking  Sensory/Perceptual disturbances:    WNL  Orientation:  Fully oriented  Attention:  Good    Concentration:  Fair  Memory:  grossly intact  Insight:    Variable  Judgment:   Fair  Impulse Control:  Fair   Risk Assessment: Danger to Self: No Self-injurious Behavior: No Danger to Others: No Physical Aggression / Violence: No Duty to Warn: No Access to Firearms a concern: No  Assessment of progress:  progressing  Diagnosis:   ICD-10-CM   1. Generalized anxiety disorder  F41.1     2. r/o MCI  R69     3. 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 Social isolation  Z60.4  5. Fear of falling (due to cataracts, weight/gait, and hx fall and fracture)  F40.298      Plan:  Clutter/life admin Mail collecting -- Still encourage to fetch her own mail and brave the curb, either with van or on foot.  OK to contract with a neighbor for now.  OK to pursue permission to get a house-mounted box on the basis of accessibility. Paper clutter -- Try to clear any reasonable amount of paper clutter at home -- glean checks, toss trash, collect things she needs to make more complicated decisions about and reserve for when she has the initiative Assisted paperwork and decluttering --  Continuing offer to open mail, triage paperwork, make life admin calls, or other tasks together in office if it can un-stick motivation to do on her own time.  May retain a helper, possibly, through neighbor or a Teacher, English as a foreign language.  Or a Production manager. Financial actions -- Make calls to settle expired benefit checks, stop outdated automatic payments, and cover any bills that may be neglected Technical help -- Consider engaging a senior support service who might provide some technical help, material assistance, and/or companion  Side issues and righteous distractions -- Let go the phantom "promise" of $10K tax refund unless willing to ask D directly to recant her filing status and ask tax preparer to work out amended return at senseless cost.  Recognize any other quixotic ideas as attractive in principle but unfeasible in practice and a very real drain on the chores she can get through now.  Seek to recognize any "6-inch crosses" (H's term) she might take up, conserve energy for true priorities. [name] grievances Advise fact-check issues before deciding they need fighting Watch out for kitchen-sinking issues to the point where neither she nor the [name] can respond effectively -- divide and conquer Follow up on community advice and referrals to pursue her issues with [name] Open offer to call [name] from therapy to clarify and move the process along Strongly advise dropping hearsay concerns about other people who several years ago allegedly saw or said something -- very distracting to hr focal needs, and pursuing the subjects would greatly harm her cas and credibility If determined to approach the city instead, recommend refining message and approaching an agency like [name] instead of city council Cognitive Continue to assess for MCI, signs of dementia May need permission to consult with daughter Nicole Gibbs for better understanding of her issues, limitations, and needs Health Continue to reconcile  medical advice actively when needed Encourage attention to reducing carbs for general health and possible benefit to emotional regulation Other recommendations/advice -- As may be noted above.  Continue to utilize previously learned skills ad lib. Medication compliance -- Maintain medication as prescribed and work faithfully with relevant prescriber(s) if any changes are desired or seem indicated. Crisis service -- Aware of call list and work-in appts.  Call the clinic on-call service, 988/hotline, 911, or present to [facility] or ER if any life-threatening psychiatric crisis. Followup -- Return for time as already scheduled.  Next scheduled visit with me 03/23/2023.  Next scheduled in this office 03/23/2023.  Maretta Shaper, PhD Nicole Ferry, PhD LP Clinical Psychologist, [facility] [address], [address] [place], [place] [address] [phone]

## 2023-03-23 ENCOUNTER — Ambulatory Visit (INDEPENDENT_AMBULATORY_CARE_PROVIDER_SITE_OTHER): Payer: Medicare Other | Admitting: Psychiatry

## 2023-03-23 DIAGNOSIS — F423 Hoarding disorder: Secondary | ICD-10-CM | POA: Diagnosis not present

## 2023-03-23 DIAGNOSIS — Z609 Problem related to social environment, unspecified: Secondary | ICD-10-CM

## 2023-03-23 DIAGNOSIS — F411 Generalized anxiety disorder: Secondary | ICD-10-CM | POA: Diagnosis not present

## 2023-03-23 DIAGNOSIS — Z604 Social exclusion and rejection: Secondary | ICD-10-CM | POA: Diagnosis not present

## 2023-03-23 DIAGNOSIS — R69 Illness, unspecified: Secondary | ICD-10-CM

## 2023-03-23 DIAGNOSIS — F40298 Other specified phobia: Secondary | ICD-10-CM

## 2023-03-23 NOTE — Progress Notes (Signed)
 Psychotherapy Progress Note [facility] Delora Ferry, PhD LP  Patient ID: Nicole Gibbs ([place])    MRN: [id number] Therapy format: Individual psychotherapy Date: 03/23/2023      Start: 2:07p     Stop: 2:53p     Time Spent: 46 min Location: In-person   Session narrative (presenting needs, interim history, self-report of stressors and symptoms, applications of prior therapy, status changes, and interventions made in session) Trying to do self-care for disappointment over the election, but hopes dashed, worried what will become of us  all.  Re the mail, still has neighbor retrieving it for her, and neighbor has agreed it's a precarious 2 steps to get to the mailbox, OK by her to do this for her on an ongoing basis.  Received a month's worth of mail this week, mostly unopened as yet.  Got a long-procrastinated second key made, but tends to focus not on the accomplishment but on how long it took her to get through it.  Encouraged to balance that out, and self-affirm the good work done, even as she picks up to work through the latest pile of mail.   Returned to the problem of [employer] still taking out premiums on [name], who died over 5 yrs ago.  Says she called them a year and a half ago, got instructions, put a copy of the death certificate in an envelope, but never mailed it.  Over $200/mo withdrawn automatically now, for over 5 years.  Encouraged to get in touch, restart the process, stop the bleeding, since the insurance company will not on their own relieve her of the burden but continue with the status quo they know.  She reports being paralyzed writing 3 checks for other matters, including property tax bills on two properties.  Encouraged there to practice striking while the iron  is hot, and remind herself of the relief she'll feel getting it done.  Holiday vacation plan with [name] is now well set to meet at the beach.  [name] cancelled the idea of flying here and driving the [place] together to  [place], as it would be awkward for her, and maybe less than trustworthy for a long trip.  Also doesn't want Bayla flying alone, but Evangelynn feels up to it.  Probed her awareness and judgment for air travel procedures, satisfied she could get help where needed navigating airports if she chooses to fly.  Offered possibility of [name] flying here and the two of them renting an acceptable car here, but Akyia is adamant that she doesn't want [name] here, feels somehow that [name] would not feel safe at the townhouse complex (?).  Reality-checked this, and Pt digresses onto her grievances, wanting to move, wanting to air her grievances at [employer] ... Brought back to the subject, agreed to talk over logistics with [name] and work out procedure.  Therapeutic modalities: Cognitive Behavioral Therapy, Solution-Oriented/Positive Psychology, and Ego-Supportive  Mental Status/Observations:  Appearance:   Casual     Behavior:  Monopolizing, distressed and reacting  Motor:  grossly intact  Speech/Language:   Clear and Coherent  Affect:  Appropriate  Mood:  anxious and irritable  Thought process:  flight of ideas  Thought content:    Obsessions  Sensory/Perceptual disturbances:    WNL  Orientation:  Fully oriented  Attention:  Good    Concentration:  Fair  Memory:  WNL  Insight:    Fair  Judgment:   Fair  Impulse Control:  Good   Risk Assessment: Danger to  Self: No Self-injurious Behavior: No Danger to Others: No Physical Aggression / Violence: No Duty to Warn: No Access to Firearms a concern: No  Assessment of progress:  stable  Diagnosis:   ICD-10-CM   1. Generalized anxiety disorder  F41.1     2. r/o MCI  R69     3. 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 Fear of falling (due to cataracts, weight/gait, and hx fall and fracture)  F40.298     5. Social isolation  Z60.4     6. Difficulty with community participation  Z60.9      Plan:  Clutter/life admin Mail collecting --  Still encourage to fetch her own mail and brave the curb, either with van or on foot.  OK to contract with a neighbor for now.  OK to pursue permission to get a house-mounted box on the basis of accessibility. Paper clutter -- Try to clear any reasonable amount of paper clutter at home -- glean checks, toss trash, collect things she needs to make more complicated decisions about and reserve for when she has the initiative Assisted paperwork and decluttering -- Continuing offer to open mail, triage paperwork, make life admin calls, or other tasks together in office if it can un-stick motivation to do on her own time.  May retain a helper, possibly, through neighbor or a Teacher, English as a foreign language.  Or a Production manager. Financial actions -- Make calls to settle expired benefit checks, stop outdated automatic payments, and cover any bills that may be neglected Technical help -- Consider engaging a senior support service who might provide some technical help, material assistance, and/or companion  Side issues and righteous distractions -- Let go the phantom "promise" of $10K tax refund unless willing to ask D directly to recant her filing status and ask tax preparer to work out amended return at senseless cost.  Recognize any other quixotic ideas as attractive in principle but unfeasible in practice and a very real drain on the chores she can get through now.  Seek to recognize any "6-inch crosses" (H's term) she might take up, conserve energy for true priorities. [name] grievances Advise fact-check issues before deciding they need fighting Watch out for kitchen-sinking issues to the point where neither she nor the [name] can respond effectively -- divide and conquer Follow up on community advice and referrals to pursue her issues with [name] Open offer to call [name] from therapy to clarify and move the process along Strongly advise dropping hearsay concerns about other people who several years ago  allegedly saw or said something -- very distracting to hr focal needs, and pursuing the subjects would greatly harm her cas and credibility If determined to approach the city instead, recommend refining message and approaching an agency like [name] instead of city council Cognitive Continue to assess for MCI, signs of dementia May need permission to consult with daughter [name] for better understanding of her issues, limitations, and needs Health Continue to reconcile medical advice actively when needed Encourage attention to reducing carbs for general health and possible benefit to emotional regulation Other recommendations/advice -- As may be noted above.  Continue to utilize previously learned skills ad lib. Medication compliance -- Maintain medication as prescribed and work faithfully with relevant prescriber(s) if any changes are desired or seem indicated. Crisis service -- Aware of call list and work-in appts.  Call the clinic on-call service, 988/hotline, 911, or present to [facility] or ER if any  life-threatening psychiatric crisis. Followup -- Return for time as already scheduled.  Next scheduled visit with me 04/06/2023.  Next scheduled in this office 04/06/2023.  Maretta Shaper, PhD Delora Ferry, PhD LP Clinical Psychologist, [facility] [address], [address] [place], [place] [address] [phone]

## 2023-04-06 ENCOUNTER — Ambulatory Visit (INDEPENDENT_AMBULATORY_CARE_PROVIDER_SITE_OTHER): Payer: Medicare Other | Admitting: Psychiatry

## 2023-04-06 DIAGNOSIS — F40298 Other specified phobia: Secondary | ICD-10-CM | POA: Diagnosis not present

## 2023-04-06 DIAGNOSIS — F411 Generalized anxiety disorder: Secondary | ICD-10-CM | POA: Diagnosis not present

## 2023-04-06 DIAGNOSIS — F423 Hoarding disorder: Secondary | ICD-10-CM

## 2023-04-06 DIAGNOSIS — Z604 Social exclusion and rejection: Secondary | ICD-10-CM

## 2023-04-06 DIAGNOSIS — R69 Illness, unspecified: Secondary | ICD-10-CM

## 2023-04-06 NOTE — Progress Notes (Signed)
 Psychotherapy Progress Note [facility] Nicole Ferry, PhD LP  Patient ID: Nicole Gibbs (Nicole Gibbs)    MRN: [id number] Therapy format: Individual psychotherapy Date: 04/06/2023      Start: 3:20p     Stop: 4:02p     Time Spent: 42 min Location: In-person   Session narrative (presenting needs, interim history, self-report of stressors and symptoms, applications of prior therapy, status changes, and interventions made in session) Stirred by [facility] today, shared and validated.  Nicole Gibbs has bought [employer] for the intended Christmas trip, leaving Nicole Gibbs to decide for herself to fly herself, drive the Nicole Gibbs (unwanted by Nicole Gibbs), or rent a newer car and drive herself to the beach.  Admittedly, maybe 20 years since Nicole Gibbs had driven a long road trip, but she does believe she'll be able to rent the house, plot the trip, and follow written directions.    Bothered by a trip to an Scientist, water quality at [facility], found the door locked, sat down in a seat too low for her and couldn't get up.  Thankfully a stranger helped her up.  Validated new awareness of her mobility issues and discussed advantages of acquiring a mobile phone.    Now getting her mail 1/wk, with an agreement from a neighbor to fetch it for her with consent.  Says she understands and validates her concern about the curb at the [employer].  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  Assured it's good she has a helpful neighbor, now challenge remains to go through her mail promptly so she does not increase issues of neglected bills and deposits and clutter in her living space.  Discussed outlook for time, as she has failed to schedule ahead as recommended.  Made aware it is 8 weeks' wait at this point, but  she can put herself on the cancellation list or call in for urgent scheduling.  Therapeutic modalities: Cognitive Behavioral Therapy, Solution-Oriented/Positive Psychology, and Ego-Supportive  Mental Status/Observations:  Appearance:   Casual     Behavior:  Monopolizing  Motor:  Cane  Speech/Language:   Clear and Coherent  Affect:  Appropriate  Mood:  anxious  Thought process:  tangential  Thought content:    Ilusions  Sensory/Perceptual disturbances:    WNL  Orientation:  Fully oriented  Attention:  Good    Concentration:  Fair  Memory:  WNL  Insight:    Variable  Judgment:   Good  Impulse Control:  Good   Risk Assessment: Danger to Self: No Self-injurious Behavior: No Danger to Others: No Physical Aggression / Violence: No Duty to Warn: No Access to Firearms a concern: No  Assessment of progress:  stabilized  Diagnosis:   ICD-10-CM   1. Generalized anxiety disorder  F41.1     2. Fear of falling (due to cataracts, weight/gait, and hx fall and fracture)  F40.298     3. 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 r/o MCI  R69     5. Social isolation  Z60.4      Plan:  Hotel manager -- Still encourage to fetch her own mail and brave the curb, either with van or on foot.  OK to contract with a neighbor for now.  OK to pursue  permission to get a house-mounted box on the basis of accessibility. Paper clutter -- Try to clear any reasonable amount of paper clutter at home -- glean checks, toss trash, collect things she needs to make more complicated decisions about and reserve for when she has the initiative Assisted paperwork and decluttering -- Continuing offer to open mail, triage paperwork, make life admin calls, or other tasks together in office if it can un-stick motivation to do on her own time.  May retain a helper, possibly, through neighbor or a Teacher, English as a foreign language.  Or a Production manager. Financial actions --  Make calls to settle expired benefit checks, stop outdated automatic payments, and cover any bills that may be neglected Technical help -- Consider engaging a senior support service who might provide some technical help, material assistance, and/or companion  Side issues and righteous distractions -- Let go the phantom "promise" of $10K tax refund unless willing to ask D directly to recant her filing status and ask tax preparer to work out amended return at senseless cost.  Recognize any other quixotic ideas as attractive in principle but unfeasible in practice and a very real drain on the chores she can get through now.  Seek to recognize any "6-inch crosses" (H's term) she might take up, conserve energy for true priorities. [name] grievances Advise fact-check issues before deciding they need fighting Watch out for kitchen-sinking issues to the point where neither she nor the [name] can respond effectively -- divide and conquer Follow up on community advice and referrals to pursue her issues with [name] Open offer to call [name] from therapy to clarify and move the process along Strongly advise dropping hearsay concerns about other people who several years ago allegedly saw or said something -- very distracting to hr focal needs, and pursuing the subjects would greatly harm her cas and credibility If determined to approach the city instead, recommend refining message and approaching an agency like [name] instead of city council Cognitive Continue to assess for MCI, signs of dementia May need permission to consult with daughter Nicole Gibbs for better understanding of her issues, limitations, and needs -- declined so far Health Continue to reconcile medical advice actively when needed Encourage attention to reducing carbs for general health and possible benefit to emotional regulation Other recommendations/advice -- As may be noted above.  Continue to utilize previously learned skills ad lib. Medication compliance --  Maintain medication as prescribed and work faithfully with relevant prescriber(s) if any changes are desired or seem indicated. Crisis service -- Aware of call list and work-in appts.  Call the clinic on-call service, 988/hotline, 911, or present to [facility] or ER if any life-threatening psychiatric crisis. Followup -- Return for time as already scheduled.  Next scheduled visit with me 04/18/2023.  Next scheduled in this office 04/18/2023.  Maretta Shaper, PhD Nicole Ferry, PhD LP Clinical Psychologist, [facility] [address], [address] [place], [place] [address] [phone]

## 2023-04-11 ENCOUNTER — Ambulatory Visit: Payer: Medicare Other | Attending: Internal Medicine

## 2023-04-11 DIAGNOSIS — I82403 Acute embolism and thrombosis of unspecified deep veins of lower extremity, bilateral: Secondary | ICD-10-CM | POA: Diagnosis not present

## 2023-04-11 DIAGNOSIS — Z7901 Long term (current) use of anticoagulants: Secondary | ICD-10-CM | POA: Insufficient documentation

## 2023-04-11 LAB — POCT INR: INR: 3.7 — AB (ref 2.0–3.0)

## 2023-04-11 NOTE — Patient Instructions (Signed)
HOLD TODAY ONLY THEN Continue taking Warfarin 1 tablet daily.  Recheck INR in 3 weeks. [facility]in Clinic [phone].

## 2023-04-18 ENCOUNTER — Ambulatory Visit: Payer: Medicare Other | Admitting: Psychiatry

## 2023-04-19 DIAGNOSIS — B3749 Other urogenital candidiasis: Secondary | ICD-10-CM | POA: Diagnosis not present

## 2023-04-19 DIAGNOSIS — R8271 Bacteriuria: Secondary | ICD-10-CM | POA: Diagnosis not present

## 2023-05-02 ENCOUNTER — Ambulatory Visit: Payer: Medicare Other | Attending: Cardiology | Admitting: *Deleted

## 2023-05-02 ENCOUNTER — Ambulatory Visit: Payer: Medicare Other | Admitting: Psychiatry

## 2023-05-02 DIAGNOSIS — I82403 Acute embolism and thrombosis of unspecified deep veins of lower extremity, bilateral: Secondary | ICD-10-CM | POA: Insufficient documentation

## 2023-05-02 DIAGNOSIS — I2699 Other pulmonary embolism without acute cor pulmonale: Secondary | ICD-10-CM | POA: Insufficient documentation

## 2023-05-02 DIAGNOSIS — Z7901 Long term (current) use of anticoagulants: Secondary | ICD-10-CM | POA: Diagnosis not present

## 2023-05-02 LAB — POCT INR: INR: 2.6 (ref 2.0–3.0)

## 2023-05-02 NOTE — Patient Instructions (Addendum)
Description   Continue taking Warfarin 1 tablet daily.  Recheck INR in 4 weeks.  Coumadin Clinic [phone]

## 2023-05-07 ENCOUNTER — Other Ambulatory Visit: Payer: Self-pay | Admitting: Cardiovascular Disease

## 2023-05-18 ENCOUNTER — Ambulatory Visit: Payer: Medicare Other | Admitting: Urology

## 2023-05-23 ENCOUNTER — Ambulatory Visit: Payer: Medicare Other | Admitting: Psychiatry

## 2023-05-30 ENCOUNTER — Ambulatory Visit: Payer: Medicare Other

## 2023-06-05 ENCOUNTER — Ambulatory Visit: Payer: Medicare Other

## 2023-06-06 ENCOUNTER — Ambulatory Visit: Payer: Medicare Other | Admitting: Psychiatry

## 2023-06-06 ENCOUNTER — Ambulatory Visit: Payer: Medicare Other

## 2023-06-06 DIAGNOSIS — R69 Illness, unspecified: Secondary | ICD-10-CM

## 2023-06-06 DIAGNOSIS — Z609 Problem related to social environment, unspecified: Secondary | ICD-10-CM | POA: Diagnosis not present

## 2023-06-06 DIAGNOSIS — F423 Hoarding disorder: Secondary | ICD-10-CM

## 2023-06-06 DIAGNOSIS — F411 Generalized anxiety disorder: Secondary | ICD-10-CM | POA: Diagnosis not present

## 2023-06-06 DIAGNOSIS — Z604 Social exclusion and rejection: Secondary | ICD-10-CM | POA: Diagnosis not present

## 2023-06-06 DIAGNOSIS — F40298 Other specified phobia: Secondary | ICD-10-CM | POA: Diagnosis not present
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
## 2023-06-08 ENCOUNTER — Ambulatory Visit: Payer: Medicare Other

## 2023-06-13 ENCOUNTER — Ambulatory Visit: Payer: Medicare Other | Attending: Cardiovascular Disease | Admitting: *Deleted

## 2023-06-13 DIAGNOSIS — I2699 Other pulmonary embolism without acute cor pulmonale: Secondary | ICD-10-CM | POA: Insufficient documentation

## 2023-06-13 DIAGNOSIS — I82403 Acute embolism and thrombosis of unspecified deep veins of lower extremity, bilateral: Secondary | ICD-10-CM | POA: Insufficient documentation

## 2023-06-13 DIAGNOSIS — Z7901 Long term (current) use of anticoagulants: Secondary | ICD-10-CM | POA: Diagnosis not present

## 2023-06-13 LAB — POCT INR: INR: 3.3 — AB (ref 2.0–3.0)

## 2023-06-13 NOTE — Patient Instructions (Signed)
Description   Do not take any warfarin today then continue taking Warfarin 1 tablet daily.  Recheck INR in 3 weeks. Coumadin Clinic [phone].

## 2023-06-19 DIAGNOSIS — E049 Nontoxic goiter, unspecified: Secondary | ICD-10-CM | POA: Diagnosis not present

## 2023-06-22 DIAGNOSIS — E0520 Thyrotoxicosis with toxic multinodular goiter without thyrotoxic crisis or storm: Secondary | ICD-10-CM | POA: Diagnosis not present

## 2023-06-22 DIAGNOSIS — E559 Vitamin D deficiency, unspecified: Secondary | ICD-10-CM | POA: Diagnosis not present

## 2023-06-22 DIAGNOSIS — M810 Age-related osteoporosis without current pathological fracture: Secondary | ICD-10-CM | POA: Diagnosis not present

## 2023-06-22 DIAGNOSIS — I10 Essential (primary) hypertension: Secondary | ICD-10-CM | POA: Diagnosis not present

## 2023-06-22 DIAGNOSIS — E049 Nontoxic goiter, unspecified: Secondary | ICD-10-CM | POA: Diagnosis not present

## 2023-06-23 ENCOUNTER — Ambulatory Visit: Payer: Medicare Other | Admitting: Psychiatry

## 2023-06-23 DIAGNOSIS — F411 Generalized anxiety disorder: Secondary | ICD-10-CM | POA: Diagnosis not present

## 2023-06-23 DIAGNOSIS — F423 Hoarding disorder: Secondary | ICD-10-CM

## 2023-06-23 DIAGNOSIS — Z609 Problem related to social environment, unspecified: Secondary | ICD-10-CM | POA: Diagnosis not present

## 2023-06-23 DIAGNOSIS — Z604 Social exclusion and rejection: Secondary | ICD-10-CM

## 2023-06-23 DIAGNOSIS — F40298 Other specified phobia: Secondary | ICD-10-CM | POA: Diagnosis not present

## 2023-06-23 DIAGNOSIS — R69 Illness, unspecified: Secondary | ICD-10-CM | POA: Diagnosis not present

## 2023-06-23 NOTE — Progress Notes (Signed)
 Psychotherapy Progress Note [facility] Delora Ferry, PhD LP  Patient ID: Nicole Gibbs (Ezrah)    MRN: [id number] Therapy format: Individual psychotherapy Date: 06/23/2023      Start: 3:15p     Stop: 4:01p     Time Spent: 46 min Location: In-person   Session narrative (presenting needs, interim history, self-report of stressors and symptoms, applications of prior therapy, status changes, and interventions made in session) Brings 3 unopened IRS letters with her, per prior agreement that we could collaborate to take action on processing clutter and unfinished business.  Initially shocked at the 4-digit numbers she saw and assumed thought she owed, but helped her to see that they are starting entries in an accounting of amounts billed and paid, to the effect that she owes less than $50, in interest, on the delayed payments, and that the three bills she has are actually duplicates over time.  Encouraged, despite resistance, to decisively cut one check for the most recent bill, dispose of the priors, do it without agonizing or second-guessing, and refrain from trying to reopen an increasingly ancient tax case on the fantasy that she can prove they owe her (fueled by a dubious memory that [employer] told her they would, a couple years ago).  Especially valuable to get this item done when she has so many backlogged already, including some that will put new money in her pocket if she sees them through.  Affected hard by federal govt changes under way.  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  Support/validation provided.   Therapeutic modalities: Cognitive Behavioral Therapy, Solution-Oriented/Positive Psychology, Environmental manager, and Motivational Interviewing  Mental Status/Observations:  Appearance:   Casual     Behavior:  Appropriate  Motor:  Normal and cane  Speech/Language:   Clear and Coherent  and some pressure, responsive to direction  Affect:  Appropriate  Mood:  anxious  Thought process:  Some flight, responsive  Thought content:    Obsessions  Sensory/Perceptual disturbances:    WNL  Orientation:  Fully oriented  Attention:  Good    Concentration:  Fair  Memory:  grossly intact, with question of errors in comprehension  Insight:    Fair  Judgment:   Fair  Impulse Control:  Good   Risk Assessment: Danger to Self: No Self-injurious Behavior: No Danger to Others: No Physical Aggression / Violence: No Duty to Warn: No Access to Firearms a concern: No  Assessment of progress:  progressing  Diagnosis:   ICD-10-CM   1. Generalized anxiety disorder  F41.1     2. 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 Fear of falling (due to cataracts, weight/gait, and hx fall and fracture)  F40.298     4. Difficulty with community participation  Z60.9     5. Social isolation  Z60.4     6. r/o MCI  R69      Plan:  Clutter/life admin Mail collecting -- Still encourage to fetch her own mail and brave the curb, either with van or on foot.  OK to contract with a neighbor for now.  OK to pursue permission to get a house-mounted box on the basis of accessibility. Paper clutter -- Try to clear any reasonable amount of paper clutter at home -- glean checks, toss trash, collect things she needs to make more complicated decisions about and reserve for when she has  the initiative Assisted paperwork and decluttering -- Continuing offer to open mail, triage paperwork, make life admin calls, or other tasks together in office if it can un-stick motivation to do on her own time.  May retain a helper, possibly, through neighbor or a Teacher, English as a foreign language.  Or a Production manager. Financial actions -- Make calls to settle expired benefit checks, stop outdated automatic payments, and cover any bills that may be neglected Technical help -- Consider engaging a senior  support service who might provide some technical help, material assistance, and/or companion  Side issues and righteous distractions -- Let go the phantom "promise" of $10K tax refund unless willing to ask D directly to recant her filing status and ask tax preparer to work out amended return at senseless cost.  Recognize any other quixotic ideas as attractive in principle but unfeasible in practice and a very real drain on the chores she can get through now.  Seek to recognize any "6-inch crosses" (H's term) she might take up, conserve energy for true priorities. Social grievances ([name], primarily) Advise fact-check issues before deciding they need fighting Watch out for kitchen-sinking issues to the point where neither she nor the [name] can respond effectively -- divide and conquer Follow up on community advice and referrals to pursue her issues with [name] Open offer to call [name] from therapy to clarify and move the process along Strongly advise dropping hearsay concerns about other people who several years ago allegedly saw or said something -- very distracting to hr focal needs, and pursuing the subjects would greatly harm her cas and credibility If determined to approach the city instead, recommend refining message and approaching an agency like [name] instead of city council Where political activism is motivating, feel free to engage with others Cognitive Continue to assess for MCI, signs of dementia May need permission to consult with daughter [name] for better understanding of her issues, limitations, and needs -- declined so far Health Continue to reconcile medical advice actively when needed Encourage attention to reducing carbs for general health and possible benefit to emotional regulation Other recommendations/advice -- As may be noted above.  Continue to utilize previously learned skills ad lib. Medication compliance -- Maintain medication as prescribed and work faithfully with relevant  prescriber(s) if any changes are desired or seem indicated. Crisis service -- Aware of call list and work-in appts.  Call the clinic on-call service, 988/hotline, 911, or present to [facility] or ER if any life-threatening psychiatric crisis. Followup -- No follow-ups on file.  Next scheduled visit with me 07/11/2023.  Next scheduled in this office 07/11/2023.  Maretta Shaper, PhD Delora Ferry, PhD LP Clinical Psychologist, [facility] [address] [place], [place] [address] [phone]

## 2023-07-04 ENCOUNTER — Ambulatory Visit: Payer: Medicare Other | Attending: Cardiovascular Disease | Admitting: *Deleted

## 2023-07-04 ENCOUNTER — Other Ambulatory Visit: Payer: Self-pay | Admitting: *Deleted

## 2023-07-04 DIAGNOSIS — I82403 Acute embolism and thrombosis of unspecified deep veins of lower extremity, bilateral: Secondary | ICD-10-CM | POA: Insufficient documentation

## 2023-07-04 DIAGNOSIS — I2699 Other pulmonary embolism without acute cor pulmonale: Secondary | ICD-10-CM | POA: Diagnosis not present

## 2023-07-04 DIAGNOSIS — Z7901 Long term (current) use of anticoagulants: Secondary | ICD-10-CM | POA: Diagnosis not present

## 2023-07-04 LAB — POCT INR: INR: 3 (ref 2.0–3.0)

## 2023-07-04 MED ORDER — WARFARIN SODIUM 3 MG PO TABS: ORAL_TABLET | ORAL | 1 refills | Status: DC

## 2023-07-04 NOTE — Telephone Encounter (Signed)
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

## 2023-07-04 NOTE — Patient Instructions (Addendum)
Description   Continue taking Warfarin 1 tablet daily. You can have a leafy green today.  Recheck INR in 4 weeks. Coumadin Clinic [phone].

## 2023-07-08 ENCOUNTER — Other Ambulatory Visit: Payer: Self-pay | Admitting: Cardiovascular Disease

## 2023-07-08 DIAGNOSIS — I5042 Chronic combined systolic (congestive) and diastolic (congestive) heart failure: Secondary | ICD-10-CM

## 2023-07-08 DIAGNOSIS — I5043 Acute on chronic combined systolic (congestive) and diastolic (congestive) heart failure: Secondary | ICD-10-CM

## 2023-07-11 ENCOUNTER — Ambulatory Visit: Payer: Medicare Other | Admitting: Psychiatry

## 2023-07-11 DIAGNOSIS — F40298 Other specified phobia: Secondary | ICD-10-CM | POA: Diagnosis not present

## 2023-07-11 DIAGNOSIS — F423 Hoarding disorder: Secondary | ICD-10-CM | POA: Diagnosis not present

## 2023-07-11 DIAGNOSIS — Z604 Social exclusion and rejection: Secondary | ICD-10-CM

## 2023-07-11 DIAGNOSIS — F411 Generalized anxiety disorder: Secondary | ICD-10-CM

## 2023-07-11 DIAGNOSIS — Z609 Problem related to social environment, unspecified: Secondary | ICD-10-CM

## 2023-07-11 DIAGNOSIS — R69 Illness, unspecified: Secondary | ICD-10-CM
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
## 2023-08-01 ENCOUNTER — Ambulatory Visit: Payer: Medicare Other | Attending: Internal Medicine | Admitting: *Deleted

## 2023-08-01 DIAGNOSIS — I82403 Acute embolism and thrombosis of unspecified deep veins of lower extremity, bilateral: Secondary | ICD-10-CM | POA: Insufficient documentation

## 2023-08-01 DIAGNOSIS — I2699 Other pulmonary embolism without acute cor pulmonale: Secondary | ICD-10-CM | POA: Diagnosis not present

## 2023-08-01 DIAGNOSIS — Z7901 Long term (current) use of anticoagulants: Secondary | ICD-10-CM | POA: Insufficient documentation

## 2023-08-01 LAB — POCT INR: INR: 2.2 (ref 2.0–3.0)

## 2023-08-01 NOTE — Patient Instructions (Addendum)
Description   Continue taking Warfarin 1 tablet daily. Recheck INR in 5 weeks.  Coumad[facility] [phone]     

## 2023-08-02 ENCOUNTER — Ambulatory Visit (INDEPENDENT_AMBULATORY_CARE_PROVIDER_SITE_OTHER): Payer: Medicare Other | Admitting: Psychiatry

## 2023-08-02 DIAGNOSIS — F423 Hoarding disorder: Secondary | ICD-10-CM | POA: Diagnosis not present

## 2023-08-02 DIAGNOSIS — F5104 Psychophysiologic insomnia: Secondary | ICD-10-CM

## 2023-08-02 DIAGNOSIS — Z604 Social exclusion and rejection: Secondary | ICD-10-CM | POA: Diagnosis not present

## 2023-08-02 DIAGNOSIS — Z609 Problem related to social environment, unspecified: Secondary | ICD-10-CM

## 2023-08-02 DIAGNOSIS — F411 Generalized anxiety disorder: Secondary | ICD-10-CM | POA: Diagnosis not present
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
## 2023-08-16 ENCOUNTER — Ambulatory Visit (INDEPENDENT_AMBULATORY_CARE_PROVIDER_SITE_OTHER): Payer: Medicare Other | Admitting: Psychiatry

## 2023-08-16 DIAGNOSIS — F40298 Other specified phobia: Secondary | ICD-10-CM

## 2023-08-16 DIAGNOSIS — F5104 Psychophysiologic insomnia: Secondary | ICD-10-CM

## 2023-08-16 DIAGNOSIS — F423 Hoarding disorder: Secondary | ICD-10-CM

## 2023-08-16 DIAGNOSIS — R69 Illness, unspecified: Secondary | ICD-10-CM | POA: Diagnosis not present

## 2023-08-16 DIAGNOSIS — F411 Generalized anxiety disorder: Secondary | ICD-10-CM | POA: Diagnosis not present

## 2023-08-16 DIAGNOSIS — Z609 Problem related to social environment, unspecified: Secondary | ICD-10-CM

## 2023-08-16 DIAGNOSIS — Z604 Social exclusion and rejection: Secondary | ICD-10-CM
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
## 2023-08-23 ENCOUNTER — Other Ambulatory Visit: Payer: Self-pay | Admitting: Cardiovascular Disease

## 2023-08-28 ENCOUNTER — Encounter (HOSPITAL_COMMUNITY): Payer: Self-pay | Admitting: Emergency Medicine

## 2023-08-28 ENCOUNTER — Observation Stay (HOSPITAL_COMMUNITY)

## 2023-08-28 ENCOUNTER — Telehealth: Payer: Self-pay | Admitting: Student

## 2023-08-28 ENCOUNTER — Inpatient Hospital Stay (HOSPITAL_COMMUNITY)
Admission: EM | Admit: 2023-08-28 | Discharge: 2023-08-31 | DRG: 308 | Disposition: A | Discharge status: Home Health | Attending: Internal Medicine | Admitting: Internal Medicine

## 2023-08-28 ENCOUNTER — Other Ambulatory Visit: Payer: Self-pay

## 2023-08-28 ENCOUNTER — Emergency Department (HOSPITAL_COMMUNITY)

## 2023-08-28 DIAGNOSIS — I499 Cardiac arrhythmia, unspecified: Secondary | ICD-10-CM | POA: Diagnosis not present

## 2023-08-28 DIAGNOSIS — I110 Hypertensive heart disease with heart failure: Secondary | ICD-10-CM | POA: Diagnosis present

## 2023-08-28 DIAGNOSIS — I2489 Other forms of acute ischemic heart disease: Secondary | ICD-10-CM | POA: Diagnosis present

## 2023-08-28 DIAGNOSIS — I491 Atrial premature depolarization: Secondary | ICD-10-CM | POA: Diagnosis present

## 2023-08-28 DIAGNOSIS — I5043 Acute on chronic combined systolic (congestive) and diastolic (congestive) heart failure: Secondary | ICD-10-CM | POA: Diagnosis present

## 2023-08-28 DIAGNOSIS — E0590 Thyrotoxicosis, unspecified without thyrotoxic crisis or storm: Secondary | ICD-10-CM | POA: Diagnosis present

## 2023-08-28 DIAGNOSIS — I471 Supraventricular tachycardia, unspecified: Secondary | ICD-10-CM | POA: Diagnosis present

## 2023-08-28 DIAGNOSIS — Z7901 Long term (current) use of anticoagulants: Secondary | ICD-10-CM | POA: Diagnosis not present

## 2023-08-28 DIAGNOSIS — I10 Essential (primary) hypertension: Secondary | ICD-10-CM

## 2023-08-28 DIAGNOSIS — I4891 Unspecified atrial fibrillation: Principal | ICD-10-CM | POA: Diagnosis present

## 2023-08-28 DIAGNOSIS — R000 Tachycardia, unspecified: Secondary | ICD-10-CM | POA: Diagnosis not present

## 2023-08-28 DIAGNOSIS — E872 Acidosis, unspecified: Secondary | ICD-10-CM | POA: Diagnosis present

## 2023-08-28 DIAGNOSIS — I493 Ventricular premature depolarization: Secondary | ICD-10-CM | POA: Diagnosis present

## 2023-08-28 DIAGNOSIS — Z79899 Other long term (current) drug therapy: Secondary | ICD-10-CM

## 2023-08-28 DIAGNOSIS — I428 Other cardiomyopathies: Secondary | ICD-10-CM | POA: Diagnosis present

## 2023-08-28 DIAGNOSIS — I214 Non-ST elevation (NSTEMI) myocardial infarction: Secondary | ICD-10-CM | POA: Diagnosis not present

## 2023-08-28 DIAGNOSIS — N179 Acute kidney failure, unspecified: Secondary | ICD-10-CM | POA: Diagnosis present

## 2023-08-28 DIAGNOSIS — J9601 Acute respiratory failure with hypoxia: Secondary | ICD-10-CM | POA: Diagnosis present

## 2023-08-28 DIAGNOSIS — I959 Hypotension, unspecified: Secondary | ICD-10-CM | POA: Diagnosis not present

## 2023-08-28 DIAGNOSIS — I509 Heart failure, unspecified: Secondary | ICD-10-CM | POA: Diagnosis not present

## 2023-08-28 DIAGNOSIS — Z7983 Long term (current) use of bisphosphonates: Secondary | ICD-10-CM

## 2023-08-28 DIAGNOSIS — I472 Ventricular tachycardia, unspecified: Secondary | ICD-10-CM | POA: Diagnosis present

## 2023-08-28 DIAGNOSIS — R0602 Shortness of breath: Secondary | ICD-10-CM | POA: Diagnosis not present

## 2023-08-28 DIAGNOSIS — Z86711 Personal history of pulmonary embolism: Secondary | ICD-10-CM

## 2023-08-28 DIAGNOSIS — Z86718 Personal history of other venous thrombosis and embolism: Secondary | ICD-10-CM | POA: Diagnosis not present

## 2023-08-28 DIAGNOSIS — G4733 Obstructive sleep apnea (adult) (pediatric): Secondary | ICD-10-CM | POA: Diagnosis present

## 2023-08-28 DIAGNOSIS — R531 Weakness: Secondary | ICD-10-CM | POA: Diagnosis not present

## 2023-08-28 DIAGNOSIS — R0989 Other specified symptoms and signs involving the circulatory and respiratory systems: Secondary | ICD-10-CM | POA: Diagnosis not present

## 2023-08-28 DIAGNOSIS — Z7984 Long term (current) use of oral hypoglycemic drugs: Secondary | ICD-10-CM

## 2023-08-28 DIAGNOSIS — R0689 Other abnormalities of breathing: Secondary | ICD-10-CM | POA: Diagnosis not present

## 2023-08-28 DIAGNOSIS — Z6841 Body Mass Index (BMI) 40.0 and over, adult: Secondary | ICD-10-CM | POA: Diagnosis not present

## 2023-08-28 LAB — MAGNESIUM
Magnesium: 2.6 mg/dL — ABNORMAL HIGH (ref 1.7–2.4)
Magnesium: 2.3 mg/dL (ref 1.7–2.4)

## 2023-08-28 LAB — BASIC METABOLIC PANEL WITH GFR
Anion gap: 20 — ABNORMAL HIGH (ref 5–15)
Anion gap: 11 (ref 5–15)
BUN: 42 mg/dL — ABNORMAL HIGH (ref 8–23)
BUN: 42 mg/dL — ABNORMAL HIGH (ref 8–23)
CO2: 17 mmol/L — ABNORMAL LOW (ref 22–32)
CO2: 20 mmol/L — ABNORMAL LOW (ref 22–32)
Calcium: 8.7 mg/dL — ABNORMAL LOW (ref 8.9–10.3)
Calcium: 8.1 mg/dL — ABNORMAL LOW (ref 8.9–10.3)
Chloride: 100 mmol/L (ref 98–111)
Chloride: 109 mmol/L (ref 98–111)
Creatinine, Ser: 1.81 mg/dL — ABNORMAL HIGH (ref 0.44–1.00)
Creatinine, Ser: 1.68 mg/dL — ABNORMAL HIGH (ref 0.44–1.00)
GFR, Estimated: 29 mL/min — ABNORMAL LOW (ref >60)
GFR, Estimated: 32 mL/min — ABNORMAL LOW (ref >60)
Glucose, Bld: 136 mg/dL — ABNORMAL HIGH (ref 70–99)
Glucose, Bld: 139 mg/dL — ABNORMAL HIGH (ref 70–99)
Potassium: 4.5 mmol/L (ref 3.5–5.1)
Potassium: 3.7 mmol/L (ref 3.5–5.1)
Sodium: 137 mmol/L (ref 135–145)
Sodium: 140 mmol/L (ref 135–145)

## 2023-08-28 LAB — CBC WITH DIFFERENTIAL/PLATELET
Abs Immature Granulocytes: 0.07 10*3/uL (ref 0.00–0.07)
Basophils Absolute: 0.1 10*3/uL (ref 0.0–0.1)
Basophils Relative: 0 %
Eosinophils Absolute: 0 10*3/uL (ref 0.0–0.5)
Eosinophils Relative: 0 %
HCT: 48.8 % — ABNORMAL HIGH (ref 36.0–46.0)
Hemoglobin: 15.3 g/dL — ABNORMAL HIGH (ref 12.0–15.0)
Immature Granulocytes: 1 %
Lymphocytes Relative: 16 %
Lymphs Abs: 1.9 10*3/uL (ref 0.7–4.0)
MCH: 29.2 pg (ref 26.0–34.0)
MCHC: 31.4 g/dL (ref 30.0–36.0)
MCV: 93.1 fL (ref 80.0–100.0)
Monocytes Absolute: 1 10*3/uL (ref 0.1–1.0)
Monocytes Relative: 9 %
Neutro Abs: 8.5 10*3/uL — ABNORMAL HIGH (ref 1.7–7.7)
Neutrophils Relative %: 74 %
Platelets: 332 10*3/uL (ref 150–400)
RBC: 5.24 MIL/uL — ABNORMAL HIGH (ref 3.87–5.11)
RDW: 14.3 % (ref 11.5–15.5)
WBC: 11.5 10*3/uL — ABNORMAL HIGH (ref 4.0–10.5)
nRBC: 0.3 % — ABNORMAL HIGH (ref 0.0–0.2)

## 2023-08-28 LAB — ECHOCARDIOGRAM COMPLETE
Area-P 1/2: 3.01 cm2
Calc EF: 34.7 %
Height: 60 in
S' Lateral: 5.2 cm
Single Plane A2C EF: 38.8 %
Single Plane A4C EF: 34.1 %
Weight: 3520 [oz_av]

## 2023-08-28 LAB — BRAIN NATRIURETIC PEPTIDE: B Natriuretic Peptide: 700.4 pg/mL — ABNORMAL HIGH (ref 0.0–100.0)

## 2023-08-28 LAB — LACTIC ACID, PLASMA
Lactic Acid, Venous: 2.6 mmol/L (ref 0.5–1.9)
Lactic Acid, Venous: 2 mmol/L (ref 0.5–1.9)

## 2023-08-28 LAB — T4, FREE: Free T4: 1.51 ng/dL — ABNORMAL HIGH (ref 0.61–1.12)

## 2023-08-28 LAB — PROTIME-INR
INR: 3.4 — ABNORMAL HIGH (ref 0.8–1.2)
Prothrombin Time: 34.4 s — ABNORMAL HIGH (ref 11.4–15.2)

## 2023-08-28 LAB — TROPONIN I (HIGH SENSITIVITY)
Troponin I (High Sensitivity): 53 ng/L — ABNORMAL HIGH (ref <18)
Troponin I (High Sensitivity): 45 ng/L — ABNORMAL HIGH (ref <18)

## 2023-08-28 LAB — TSH: TSH: 0.73 u[IU]/mL (ref 0.350–4.500)

## 2023-08-28 MED ORDER — ZOLPIDEM TARTRATE 5 MG PO TABS
5.0000 mg | ORAL_TABLET | Freq: Every evening | ORAL | Status: DC | PRN
  Administered 2023-08-28 – 2023-08-30 (×3): 5 mg via ORAL
  Filled 2023-08-28 (×3): qty 1

## 2023-08-28 MED ORDER — PERFLUTREN LIPID MICROSPHERE
1.0000 mL | INTRAVENOUS | Status: AC | PRN
  Administered 2023-08-28: 2 mL via INTRAVENOUS

## 2023-08-28 MED ORDER — METOPROLOL TARTRATE 12.5 MG HALF TABLET
12.5000 mg | ORAL_TABLET | Freq: Two times a day (BID) | ORAL | Status: DC
  Administered 2023-08-28: 12.5 mg via ORAL
  Filled 2023-08-28: qty 1

## 2023-08-28 MED ORDER — FENTANYL CITRATE PF 50 MCG/ML IJ SOSY
50.0000 ug | PREFILLED_SYRINGE | Freq: Once | INTRAMUSCULAR | Status: AC
  Administered 2023-08-28: 50 ug via INTRAVENOUS
  Filled 2023-08-28: qty 1

## 2023-08-28 MED ORDER — METHIMAZOLE 5 MG PO TABS
5.0000 mg | ORAL_TABLET | Freq: Every day | ORAL | Status: DC
  Administered 2023-08-28 – 2023-08-31 (×4): 5 mg via ORAL
  Filled 2023-08-28 (×4): qty 1

## 2023-08-28 MED ORDER — METHIMAZOLE 5 MG PO TABS: 5.0000 mg | ORAL_TABLET | Freq: Every day | ORAL | Status: DC

## 2023-08-28 MED ORDER — ACETAMINOPHEN 325 MG PO TABS: 650.0000 mg | ORAL_TABLET | Freq: Four times a day (QID) | ORAL | Status: DC | PRN

## 2023-08-28 MED ORDER — WARFARIN - PHARMACIST DOSING INPATIENT: Freq: Every day | Status: DC

## 2023-08-28 MED ORDER — ACETAMINOPHEN 650 MG RE SUPP: 650.0000 mg | Freq: Four times a day (QID) | RECTAL | Status: DC | PRN

## 2023-08-28 MED ORDER — SODIUM CHLORIDE 0.9 % IV BOLUS
500.0000 mL | Freq: Once | INTRAVENOUS | Status: AC
  Administered 2023-08-28: 500 mL via INTRAVENOUS

## 2023-08-28 MED ORDER — ETOMIDATE 2 MG/ML IV SOLN
10.0000 mg | Freq: Once | INTRAVENOUS | Status: AC
  Administered 2023-08-28: 10 mg via INTRAVENOUS
  Filled 2023-08-28: qty 10

## 2023-08-28 MED ORDER — ORAL CARE MOUTH RINSE: 15.0000 mL | OROMUCOSAL | Status: DC | PRN

## 2023-08-28 MED ORDER — FUROSEMIDE 10 MG/ML IJ SOLN
40.0000 mg | Freq: Two times a day (BID) | INTRAMUSCULAR | Status: DC
  Administered 2023-08-28 – 2023-08-30 (×5): 40 mg via INTRAVENOUS
  Filled 2023-08-28 (×6): qty 4

## 2023-08-28 NOTE — Plan of Care (Signed)
  Problem: Education: Goal: Knowledge of General Education information will improve Description: Including pain rating scale, medication(s)/side effects and non-pharmacologic comfort measures Outcome: Not Progressing   Problem: Health Behavior/Discharge Planning: Goal: Ability to manage health-related needs will improve Outcome: Not Progressing   Problem: Clinical Measurements: Goal: Ability to maintain clinical measurements within normal limits will improve Outcome: Not Progressing Goal: Will remain free from infection Outcome: Not Progressing Goal: Diagnostic test results will improve Outcome: Not Progressing Goal: Respiratory complications will improve Outcome: Not Progressing Goal: Cardiovascular complication will be avoided Outcome: Not Progressing   Problem: Activity: Goal: Risk for activity intolerance will decrease Outcome: Not Progressing   Problem: Nutrition: Goal: Adequate nutrition will be maintained Outcome: Not Progressing   Problem: Coping: Goal: Level of anxiety will decrease Outcome: Not Progressing   Problem: Elimination: Goal: Will not experience complications related to bowel motility Outcome: Not Progressing Goal: Will not experience complications related to urinary retention Outcome: Not Progressing   Problem: Pain Managment: Goal: General experience of comfort will improve and/or be controlled Outcome: Not Progressing   Problem: Safety: Goal: Ability to remain free from injury will improve Outcome: Not Progressing   Problem: Skin Integrity: Goal: Risk for impaired skin integrity will decrease Outcome: Not Progressing   Problem: Education: Goal: Knowledge of disease or condition will improve Outcome: Not Progressing Goal: Understanding of medication regimen will improve Outcome: Not Progressing Goal: Individualized Educational Video(s) Outcome: Not Progressing   Problem: Activity: Goal: Ability to tolerate increased activity will  improve Outcome: Not Progressing   Problem: Cardiac: Goal: Ability to achieve and maintain adequate cardiopulmonary perfusion will improve Outcome: Not Progressing   Problem: Health Behavior/Discharge Planning: Goal: Ability to safely manage health-related needs after discharge will improve Outcome: Not Progressing

## 2023-08-28 NOTE — Progress Notes (Addendum)
 This RT at bedside for conscious sedation. Patient on 3L [place] prior. Increased to 6L during procedure and titrated to 4L post. SP02 96% Shock x1.  ETCO2 in place. AMBU at bedside

## 2023-08-28 NOTE — ED Notes (Signed)
 Daughter [name] updated as to patient's status.

## 2023-08-28 NOTE — Progress Notes (Signed)
 PHARMACY - ANTICOAGULATION CONSULT NOTE  Pharmacy Consult for warfarin Indication: pulmonary embolus and afib   Allergies  Allergen Reactions   Keflex [Cephalexin] Diarrhea   Sulfa Antibiotics Hives    Patient Measurements: Height: 5' (152.4 cm) Weight: 99.8 kg (220 lb) IBW/kg (Calculated) : 45.5 HEPARIN DW (KG): 69.7  Vital Signs: Temp: 97.5 F (36.4 C) (04/14 1048) Temp Source: Oral (04/14 1048) BP: 101/55 (04/14 1130) Pulse Rate: 72 (04/14 1130)  Labs: Recent Labs    08/28/23 0850  HGB 15.3*  HCT 48.8*  PLT 332  LABPROT 34.4*  INR 3.4*  CREATININE 1.81*  TROPONINIHS 53*    Estimated Creatinine Clearance: 28.5 mL/min (A) (by C-G formula based on SCr of 1.81 mg/dL (H)).   Medical History: Past Medical History:  Diagnosis Date   Anxiety    Arthritis    knees   Chronic combined systolic and diastolic CHF (congestive heart failure) (HCC)    a. 07/2012 Echo: EF 55-60%, Gr1 DD;  b. 06/2015 Echo: EF 35-40%, triv AI, Mod MR, mod dil LA, mildly reduced RV.   Depression    DVT, bilateral lower limbs (HCC)    H/O cardiovascular stress test    a. 07/2012 MV: fixed mid-dist anterior and apical defect - scar vs attenuation, distal ant HK, no reversibility, EF 51%-->low risk.   Hx of blood clots    Hypertensive heart disease    Hypothyroidism    Joint pain    Nocturia    Normal coronary arteries    by cardiac catheterization 09/03/15   Obese    OSA on CPAP    Postmenopausal vaginal bleeding    Pulmonary embolism, bilateral (HCC)    a. chronic coumadin.   Rash    under breasts   Skin cancer    Sleep apnea    SOB (shortness of breath)    Swelling of both lower extremities    Thyroid nodule    no meds    Assessment: Patient admitted with CC of SOB and generalized weakness. Hx of PE on warfarin PTA, regimen PTA 3mg  warfarin daily. Last dose 4/12. INR today 3.4. HgB 15.3 and PLTs 322.   Goal of Therapy:  INR 2-3 Monitor platelets by anticoagulation protocol:  Yes   Plan:  INR elevated this AM at 3.4, will hold dose today.  Check INR daily.   Mamie Searles, PharmD, BCCCP  08/28/2023,11:47 AM

## 2023-08-28 NOTE — ED Triage Notes (Signed)
 From home [facility] and gen weakness x1 week. EMS reports a HR of 180-234bpm. 18g in the right AC 10mg  dilt and 200ml of NS, denies hx of afib, on blood thinner. EKG A-fib RVR [id number]

## 2023-08-28 NOTE — Consult Note (Addendum)
 Cardiology Consultation   Patient ID: Nicole Gibbs MRN: [id number]; DOB: [birth year]  Admit date: 08/28/2023 Date of Consult: 08/28/2023  PCP:  Nicole Moselle, FNP   [facility] Providers Cardiologist:  Nicole Portal, MD        Patient Profile:   Nicole Gibbs is a 78 y.o. female with a history of normal coronary arteries on cardiac catheterization in 08/2015, chronic combined CHF with EF as low as 25-30% in 11/2020 but improved to 40-45% in 01/2022, DVT/ PE in 07/2012 on Coumadin, hypertension, toxic multinodular goiter s/p RAI in 05/2022, prior GI bleeds, obstructive sleep apnea on CPAP, who is being seen 08/28/2023 for the evaluation of atria fibrillation at the request of Dr. [name].  History of Present Illness:   Nicole Gibbs is a 78 year old female with the above history who is followed by Dr. [name]. Patient was initially referred to Cardiology in 07/2022 at which time she reported shortness of breath. She was ultimately found to have bilateral DVT and multiple bilateral PEs.She was admitted for this and placed on Lovenox and then transitioned to Coumadin. Echo at that time showed normal LV function and mild RV dysfunction. Coumadin has been followed in our office ever since. She was then admitted in 04/2015 for GI bleed with acute blood loss anemia and CHF. She required multiple units of PRBCs. EGD and Colonoscopy showed no obvious source of bleeding. GI recommended continuing anticoagulation. Echo in 06/2015 showed LVEF of 35-40% with mild LVH, mildly reduced RV function, and moderate MR. He was readmitted later in 06/2015 again for symptomatic anemia and acute on chronic CHF. She required another unit of PRBCs and was treated with IV Lasix. Outpatient Myoview in 07/2015 showed a large fixed anteroseptal, apical, and inferolateral perfusion defect consistent with scar but no reversible ischemia. This led to a cardiac catheterization in 08/2015 which showed normal coronaries. EF later  normalized to 55% on repeat Echo in 05/2019. She was hospitalized in 11/2020 for acute CHF. Echo showed LVEF of 25-30% with global hypokinesis and grade 2 diastolic dysfunction, mildly reduced RV function, and mild MR. She was diuresed with IV Lasix and GDMT was adjusted. Last Echo in 01/2022 showed LVEF of 40-45% with global hypokinesis and moderate LVH and grade 1 diastolic dysfunction.  Patient was last seen by Dr. [name] in 07/2022 at which time she was stable from a cardiac standpoint with no chest pain or shortness of breath.  Patient called the after-hours line this morning with reports of malaise and significant shortness of breath with minimal activity. She sounded short of breath at times on the phone and was advised to go to the ED. She called 911 and upon EMS arrival, she was noted to be tachycardic with heart rates in the 180s to 230s. She was given IV fluids and 10mg  of IV Diltiazem. EKG following this reportedly showed atrial fibrillation with RVR.  Upon arrival to the ED, patient was tachycardic, tachypneic, and hypoxic. BP soft at times. EKG showed atrial fibrillation, rate 168, with incomplete LBBB and non-specific ST changes. Rates were as high as the 200s on telemetry. High-sensitivity troponin minimally elevated and flat at 53 >> 45. BNP elevated at 700.4. Chest x-ray showed mild diffuse pulmonary vascular congestion, likely accentuated by low lung volume, but no frank pulmonary edema. WBC 11.5, Hgb 15.3, Plts 332. Na 137, K 4.5, Glcuose 136, BUN 42, Cr 1.81. Anion gap of 20. INR was 3.4. She was cardioverted in the ED and then  admitted. Cardiology was consulted for further evaluation.  At the time of this evaluation, patient maintaining sinus rhythm with frequent PVCs. She states she was in her usual state of health until about 1 week ago when she started to have general malaise and shortness of breath with exertion. She states it started as shortness of breath when going up the stairs but  then progressed to the point that she could not walk 5 feet without getting short of breath. She denies any dyspnea at rest. No orthopnea or PND. She has chronic lower extremity edema but states this has gotten worse over the last week. No weight gain. She denies any chest pain or palpitations. She describes some very minimal lightheadedness first thing in the morning after getting up but denies any significant dizziness. No presyncope/ syncope. She has had a dry "hacky" cough over the last week but no other recent fevers or illness. No GI symptoms. No abnormal bleeding in urine or stool. She reports decreased PO intake over the last week.  She does report she had a yeast infection around Christmas time and was told this could be due Jardiance. However, she is still taking this.  Of note, she does have a history of a toxic multinodular goiter s/p RAI in 05/2022. She follows with Endocrinology and was reportedly on Methimazole in the past but is not longer on this. She states she saw her Endocrinologist a couple of months ago and everything was fine.   Past Medical History:  Diagnosis Date   Anxiety    Arthritis    knees   Chronic combined systolic and diastolic CHF (congestive heart failure) (HCC)    a. 07/2012 Echo: EF 55-60%, Gr1 DD;  b. 06/2015 Echo: EF 35-40%, triv AI, Mod MR, mod dil LA, mildly reduced RV.   Depression    DVT, bilateral lower limbs (HCC)    H/O cardiovascular stress test    a. 07/2012 MV: fixed mid-dist anterior and apical defect - scar vs attenuation, distal ant HK, no reversibility, EF 51%-->low risk.   Hx of blood clots    Hypertensive heart disease    Hypothyroidism    Joint pain    Nocturia    Normal coronary arteries    by cardiac catheterization 09/03/15   Obese    OSA on CPAP    Postmenopausal vaginal bleeding    Pulmonary embolism, bilateral (HCC)    a. chronic coumadin.   Rash    under breasts   Skin cancer    Sleep apnea    SOB (shortness of breath)     Swelling of both lower extremities    Thyroid nodule    no meds    Past Surgical History:  Procedure Laterality Date   CARDIAC CATHETERIZATION N/A 09/03/2015   Procedure: Left Heart Cath and Coronary Angiography;  Surgeon: Runell Gess, MD;  Location: [facility] INVASIVE CV LAB;  Service: Cardiovascular;  Laterality: N/A;   CARDIOVASCULAR STRESS TEST  08-07-2012  DR HILTY/ DR BERRY   LOW RISK NUCLEAR STUDY/ NO ISCHEMIA/ FIXED MID TO DISTAL ANTERIOR AND APICAL DEFECT MAY REPRESENT SCAR VS BREAST ATTENUATION ARTIFACT/  MILD DISTAL ANTERIOR HYPOKINESIS/ EF 51%   CESAREAN SECTION  1992   COLONOSCOPY N/A 05/08/2015   Procedure: COLONOSCOPY;  Surgeon: Carman Ching, MD;  Location: [facility] ENDOSCOPY;  Service: Endoscopy;  Laterality: N/A;   DILATION AND CURETTAGE OF UTERUS  06/20/2012   Procedure: DILATATION AND CURETTAGE;  Surgeon: Jeani Hawking, MD;  Location: [facility] ORS;  Service: Gynecology;  Laterality: N/A;   dilation and evacution  1988   missed ab   DILITATION & CURRETTAGE/HYSTROSCOPY WITH THERMACHOICE ABLATION N/A 09/04/2012   Procedure: DILATATION & CURETTAGE WITH THERMACHOICE ABLATION;  Surgeon: Martine Sleek, MD;  Location: [facility];  Service: Gynecology;  Laterality: N/A;   ESOPHAGOGASTRODUODENOSCOPY N/A 05/07/2015   Procedure: ESOPHAGOGASTRODUODENOSCOPY (EGD);  Surgeon: Jolinda Necessary, MD;  Location: [facility] ENDOSCOPY;  Service: Endoscopy;  Laterality: N/A;   HYSTEROSCOPY WITH D & C  03-24-2009   TRANSTHORACIC ECHOCARDIOGRAM  08-08-2012   LVSF NORMAL/ EF 55-60%/  GRADE I DIASTOLIC DYSFUNCTION/ MILD AV AND MV REGURG./  RVSF MILDLY REDUCED     Home Medications:  Prior to Admission medications   Medication Sig Start Date End Date Taking? Authorizing Provider  acetaminophen (TYLENOL) 325 MG tablet Take 650 mg by mouth every 6 (six) hours as needed for moderate pain.   Yes [provider]  alendronate (FOSAMAX) 70 MG tablet Take 70 mg by mouth once a week. 04/19/22  Yes  [provider]  CALCIUM PO Take 1 capsule by mouth daily at 12 noon.   Yes [provider]  Cholecalciferol (VITAMIN D3 PO) Take 1 capsule by mouth daily at 12 noon.   Yes [provider]  furosemide (LASIX) 40 MG tablet TAKE 1 TABLET BY MOUTH EVERY DAY 08/23/23  Yes Avanell Leigh, MD  JARDIANCE 10 MG TABS tablet TAKE 1 TABLET(10 MG) BY MOUTH DAILY Patient taking differently: Take 10 mg by mouth at bedtime. 07/10/23  Yes Avanell Leigh, MD  losartan (COZAAR) 50 MG tablet TAKE 1 TABLET BY MOUTH EVERY DAY. 03/06/23  Yes Avanell Leigh, MD  medroxyPROGESTERone (PROVERA) 5 MG tablet Take 5 mg by mouth daily in the afternoon. 08/26/21  Yes [provider]  nystatin cream (MYCOSTATIN) Apply 1 application topically 2 (two) times daily. Patient taking differently: Apply 1 application  topically daily as needed for dry skin. 11/30/20  Yes Darus Engels A, DO  spironolactone (ALDACTONE) 25 MG tablet TAKE 1 TABLET(25 MG) BY MOUTH DAILY Patient taking differently: Take 25 mg by mouth daily. 05/08/23  Yes Avanell Leigh, MD  warfarin (COUMADIN) 3 MG tablet TAKE 1 TABLET BY MOUTH EVERY DAY AS DIRECTED BY COUMADIN CLINIC Patient taking differently: Take 3 mg by mouth daily at 4 PM. 07/04/23  Yes Avanell Leigh, MD  zolpidem (AMBIEN) 5 MG tablet Take 5 mg by mouth at bedtime. 12/21/20  Yes [provider]    Inpatient Medications: Scheduled Meds:  methIMAzole  5 mg Oral Daily   Warfarin - Pharmacist Dosing Inpatient   Does not apply q1600   Continuous Infusions:  PRN Meds: acetaminophen **OR** acetaminophen  Allergies:    Allergies  Allergen Reactions   Keflex [Cephalexin] Diarrhea   Sulfa Antibiotics Hives    Social History:   Social History   Socioeconomic History   Marital status: Widowed    Spouse name: Not on file   Number of children: Not on file   Years of education: Not on file   Highest education level: Not on file   Occupational History   Occupation: retired  Tobacco Use   Smoking status: Never   Smokeless tobacco: Never  Substance and Sexual Activity   Alcohol use: No   Drug use: No   Sexual activity: Not on file  Other Topics Concern   Not on file  Social History Narrative   Not on file   Social Drivers  of Health   Financial Resource Strain: Low Risk  (04/05/2021)   Overall Financial Resource Strain (CARDIA)    Difficulty of Paying Living Expenses: Not very hard  Food Insecurity: No Food Insecurity (04/05/2021)   Hunger Vital Sign    Worried About Running Out of Food in the Last Year: Never true    Ran Out of Food in the Last Year: Never true  Transportation Needs: No Transportation Needs (04/05/2021)   PRAPARE - Administrator, Civil Service (Medical): No    Lack of Transportation (Non-Medical): No  Physical Activity: Not on file  Stress: Not on file  Social Connections: Not on file  Intimate Partner Violence: Not on file    Family History:   Family History  Problem Relation Age of Onset   Coronary artery disease Father 55   Stroke Father    Diabetes Father    Heart disease Father    Hypertension Mother    Mental illness Mother        anxiety   Thyroid disease Mother    Sudden death Sister      ROS:  Please see the history of present illness.  Physical Exam/Data:   Vitals:   08/28/23 1100 08/28/23 1130 08/28/23 1315 08/28/23 1442  BP: 103/66 (!) 101/55 117/65   Pulse: 70 72 73   Resp: (!) 21 (!) 29 16   Temp:    98.1 F (36.7 C)  TempSrc:    Axillary  SpO2: 99% 99% 98%   Weight:      Height:       No intake or output data in the 24 hours ending 08/28/23 1524    08/28/2023    8:49 AM 07/29/2022    2:22 PM 01/18/2022    3:25 PM  Last 3 Weights  Weight (lbs) 220 lb 219 lb 223 lb 9.6 oz  Weight (kg) 99.791 kg 99.338 kg 101.424 kg     Body mass index is 42.97 kg/m.  General: 78 y.o. Caucasian female resting comfortably in no acute distress. HEENT:  Normocephalic and atraumatic. Sclera clear.  Neck: Supple. No JVD. Heart: Irregular rhythm with normal rate.  No murmurs, gallops, or rubs.  Lungs: Mild labored breathing at times. Faint crackles noted in bilateral bases. Otherwise, clear to auscultation.  Abdomen: Soft, non-distended, and non-tender to palpation.  Extremities: Mild non-pitting edema of bilateral lower extremities.     Skin: Warm and dry. Neuro: Alert and oriented x3. No focal deficits. Psych: Normal affect. Responds appropriately.   EKG:  The EKG was personally reviewed and demonstrates:  Initial EKG showed atrial fibrillation, rate 168, with incomplete LBBB and non-specific ST changes. Repeat EKG showed normal sinus rhythm, rate 97 bpm, with frequent PACs and non-specific T wave changes.  Telemetry:  Telemetry was personally reviewed and demonstrates:  Initially in atrial fibrillation with rates as high as the 200s but currently in normal sinus rhythm with frequent PVC and short runs of NSVT (longest run 3 beats) after successful DCCV in the ED.  Relevant CV Studies:  Echocardiogram 01/31/2022: Impressions:  1. Global hyopkinesis abnormal septal motion . Left ventricular ejection  fraction, by estimation, is 40 to 45%. The left ventricle has mildly  decreased function. The left ventricle demonstrates global hypokinesis.  The left ventricular internal cavity  size was moderately dilated. There is moderate left ventricular  hypertrophy. Left ventricular diastolic parameters are consistent with  Grade I diastolic dysfunction (impaired relaxation). Elevated left  ventricular end-diastolic pressure.  2. Right ventricular systolic function is normal. The right ventricular  size is normal.   3. Left atrial size was mildly dilated.   4. The mitral valve is abnormal. Mild mitral valve regurgitation. No  evidence of mitral stenosis.   5. The aortic valve is tricuspid. There is mild calcification of the  aortic valve. There is  mild thickening of the aortic valve. Aortic valve  regurgitation is mild to moderate. Aortic valve sclerosis is present, with  no evidence of aortic valve  stenosis.   6. The inferior vena cava is normal in size with greater than 50%  respiratory variability, suggesting right atrial pressure of 3 mmHg.    Laboratory Data:  High Sensitivity Troponin:   Recent Labs  Lab 08/28/23 0850 08/28/23 1105  TROPONINIHS 53* 45*     Chemistry Recent Labs  Lab 08/28/23 0850  NA 137  K 4.5  CL 100  CO2 17*  GLUCOSE 136*  BUN 42*  CREATININE 1.81*  CALCIUM 8.7*  MG 2.6*  GFRNONAA 29*  ANIONGAP 20*    No results for input(s): "PROT", "ALBUMIN", "AST", "ALT", "ALKPHOS", "BILITOT" in the last 168 hours. Lipids No results for input(s): "CHOL", "TRIG", "HDL", "LABVLDL", "LDLCALC", "CHOLHDL" in the last 168 hours.  Hematology Recent Labs  Lab 08/28/23 0850  WBC 11.5*  RBC 5.24*  HGB 15.3*  HCT 48.8*  MCV 93.1  MCH 29.2  MCHC 31.4  RDW 14.3  PLT 332   Thyroid  Recent Labs  Lab 08/28/23 1247  TSH 0.730  FREET4 1.51*    BNP Recent Labs  Lab 08/28/23 0850  BNP 700.4*    DDimer No results for input(s): "DDIMER" in the last 168 hours.   Radiology/Studies:  US RENAL Result Date: 08/28/2023 CLINICAL DATA:  [id number] AKI (acute kidney injury) (HCC) [id number]. EXAM: RENAL / URINARY TRACT ULTRASOUND COMPLETE COMPARISON:  None Available. FINDINGS: Right Kidney: Renal measurements: 5.3 x 5.5 x 10.0 cm. = volume: 152 mL. Echogenicity within normal limits. No mass or hydronephrosis visualized. Left Kidney: Renal measurements: 4.5 x 5.1 x 9.9 cm. = volume: 118.7 mL. Echogenicity within normal limits. No mass or hydronephrosis visualized. Bladder: Appears normal for degree of bladder distention. Other: None. IMPRESSION: *Unremarkable renal ultrasound exam. Electronically Signed   By: Jules Schick M.D.   On: 08/28/2023 13:15   DG Chest Portable 1 View Result Date: 08/28/2023 CLINICAL DATA:   Shortness of breath. EXAM: PORTABLE CHEST 1 VIEW COMPARISON:  11/29/2020. FINDINGS: Low lung volume. There is mild diffuse pulmonary vascular congestion, likely accentuated by low lung volume. No frank pulmonary edema. Bilateral lung fields are clear. Bilateral costophrenic angles are clear. Stable cardio-mediastinal silhouette. No acute osseous abnormalities. The soft tissues are within normal limits. IMPRESSION: Mild diffuse pulmonary vascular congestion, likely accentuated by low lung volume. No frank pulmonary edema. Electronically Signed   By: Jules Schick M.D.   On: 08/28/2023 09:41     Assessment and Plan:   New Onset Atrial Fibrillation  Patient presented with worsening shortness of breath over the last 1 week and was found to be in rapid atrial fibrillation with rates as high as the 200s.  She is on Coumadin at home due to prior DVT/ PE and INR was mildly supratherapeutic; therefore, she was successfully cardioverted in the ED with restoration of normal rhythm.  - Maintaining sinus rhythm with frequent PVCs/ PACS and short runs of NSVT.  - Potassium 4.5 and Magnesium 2.6.  - TSH normal but free T4 1.51.  -  Will start Lopressor 12.5mg  twice daily.  - CHA2DS-VASc =  5 (CHF, HTN, age x2, female). Already on Coumadin given history of DVT/ PE. INR 3.4 today. Dosing per Pharmacy.  Chronic Combined CHF Non-Ischemic Cardiomyopathy Patient has a history of chronic combined CHF with EF as low as 25-30% in the past. However, EF had improved to 40-4% on last Echo in 01/2022. BNP 700.4. Chest x-ray showed mild diffuse pulmonary vascular congestion, likely accentuated by low lung volume, but no frank pulmonary edema.  - Patient still appears mildly short of breath at times after DCCV and she has faint crackles in bilateral bases. However, she has no significant peripheral edema. Lips do appear dry and she presented in AKI with creatinine of 1.80 (baseline around 0.8 to 0.9). However, I suspect AKI is  due to decreased PO intake over the last week and possible decreased renal perfusion in setting of rapid atrial fibrillation and soft BP. It think she is volume overloaded.  - Echo pending. Suspect EF is going to be lower. - She is currently on IV fluids. However, Will stop IV fluids and start IV Lasix 40mg  twice daily.  - Continue to hold home Losartan, Spironolactone, and Jardiance. It sounds  - Continue to monitor daily weights, strict I/Os, and renal function.  Demand Ischemia High-sensitivity minimally elevated and flat at 53 >> 45. EKG showed no acute ischemic changes. Prior cardiac catheterization in 2017 showed normal coronaries.  - No chest pain.  - Troponin elevation consistent with demand ischemia secondary to rapid atrial fibrillation. No additional ischemic work-up necessary at this time.  Hypertension BP soft at times but stable.  - Will start Lopressor 12.5mg  twice daily as above. - Agree with holding home Losartan and Spironolactone given AKI and soft BP with need for rate control.  AKI Creatinine 1.80 on admission. Baseline around 0.8 to 0.9. Suspect this is due to decrease PO intake over the last week and possibly  decreased renal perfusion due to rapid atrial fibrillation and soft BP. - Currently receiving IV fluids. However, she has signs of volume overload. Will stop IV fluids and start IV Lasix as above.  - Continue to monitor closely.   Hyperthyroidism Patient has a history of toxic multinodular goiter s/p RAI in 05/2022. She follows with Endocrinology and was reported on Methimazole in the past but this was stopped due to normal TSH. TSH normal but free T4 1.51 on admission. This may have contributed to new onset atrial fibrillation. - She has been restarted on Methimazole 5mg  daily. - Management per primary team.  Otherwise, per primary team: - Elevated lactic acid  - Obstructive sleep apnea on CPAP - History of PE/ DVT    Risk Assessment/Risk Scores:     New York Heart Association (NYHA) Functional Class NYHA Class III  CHA2DS2-VASc Score = 5  This indicates a 7.2% annual risk of stroke. The patient's score is based upon: CHF History: 1 HTN History: 1 Diabetes History: 0 Stroke History: 0 Vascular Disease History: 0 Age Score: 2 Gender Score: 1   For questions or updates, please contact [facility] Please consult [email or web address] for contact info under    Signed, Corrin Parker, PA-C  08/28/2023 3:24 PM  I have personally seen and examined the patient.  My HPI, Exam, and assessment and plan are below, independent of the NPP above.  Nicole Gibbs is a 78 year old with heart failure and atrial fibrillation who presents with shortness of breath.  She has  been experiencing progressive shortness of breath over the past week, which became severe on Saturday, preventing her from walking up and down stairs. No chest pain, palpitations, near syncope, or syncope. She was unaware of being in atrial fibrillation until informed at ED evaluation.  She was found to have new atrial fibrillation with a rapid ventricular response, with a heart rate of 168, and was cardioverted in the emergency room. EKGs have shown sinus rhythm with biatrial enlargement and premature atrial contractions.  Feels fair now, she has not ambulated since.  She has a history of heart failure with combined ejection fraction, systolic and diastolic dysfunction, and non-ischemic cardiomyopathy. Her ejection fraction had previously improved with goal-directed medical therapy, but recent echocardiogram findings suggest a decrease in ejection fraction similar to prior heart catheterization results. She has mitral valvular calcification, aortic regurgitation, and IVC dilation.  She has a history of deep vein thrombosis and pulmonary embolism and is on Coumadin for anticoagulation, which she notes is due to financial reasons rather than treatment failure or a unique  hematological disease. She wants to discontinue Coumadin if possible.  She has a history of hypertension and hyperthyroidism, with recent lab work showing a slightly elevated free T4 and normal TSH. She has been diagnosed with hypothyroidism and started on methimazole by her internal medicine physicians.  She is morbidly obese with obstructive sleep apnea and no longer uses CPAP due to urinary frequency.  She reports poor oral intake and decreased water consumption, contributing to acute on chronic kidney injury. She has been experiencing multiple yeast infections, leading to the discontinuation of her SGLT2 inhibitor.  Exam notable for  Gen: mild distress, morbid obesity   Cardiac: No Rubs or Gallops, holodiastolic murmur, RRR, +2 radial pulses Respiratory: Clear to auscultation anteriorly but decreased breath sounds in the bases.  Tachypnea on exam GI: Soft, nontender, non-distended  MS: Non pitting edema;  moves all extremities Integument: Skin feels warm Neuro:  At time of evaluation, alert and oriented to person/place/time/situation  Psych: Normal affect, patient feels fair  Labs notable for   LABS Lactate: 2.6 (08/28/2023) Free T4: Slightly elevated (08/28/2023) TSH: Normal (08/28/2023)  DIAGNOSTIC EKG: Sinus rhythm with biatrial enlargement and premature atrial contractions (08/28/2023) Echocardiogram: Aortic valve calcification, mitral valve calcification, aortic regurgitation, IVC dilation, LV mildly dilated, EF decreased (08/28/2023) Tele: SR with biatrial enlargement PACs and NSVT  In assessment and plan:   Atrial Fibrillation with Rapid Ventricular Response - New onset atrial fibrillation with rapid ventricular response at 168 bpm, cardioverted in the emergency room. Suspected to have caused shortness of breath and worsening heart failure. Risk of recurrence due to premature atrial contractions (PACs). - Administer IV Lasix BID - Initiate low dose metoprolol (goal to  not suppress heart rates to much as I suspect she is decompensated) - Maintain rhythm control strategy - Consider antiarrhythmic drug therapy if atrial fibrillation recurs (IV amiodarone bolus and drip; [facility] managing hyperthyroidism) - Check electrolytes and magnesium; K goal of 4, MG goal of 2  Acute on Chronic Heart Failure with Combined Ejection Fraction Heart failure with systolic and diastolic dysfunction. Ejection fraction appears worse on recent echocardiogram. No significant valve disease, but aortic and mitral valve calcification present. IVC dilation and plethora noted. Worsening volume status suspected. Function has decreased since evaluation in 2023. - Administer IV Lasix BID - Repeat echocardiogram reviewed  Hyperthyroidism Diagnosed with hyperthyroidism by internal medicine team. Free T4 slightly elevated with normal TSH. Started on methimazole. May be contributing to  atrial fibrillation.  Managed by [facility], outpatient by endocrinology  Acute Kidney Injury (AKI) New acute kidney injury possibly related to perfusion in AF RVR. Holding off GDMT for now, given her yeast infectiosn stopping SGLT2i   Anticoagulation Management Currently on Coumadin for anticoagulation due to financial reasons. Desires to transition off Coumadin if possible. Pharmacy consultation for transition to a cost-effective DOAC is planned.  Obstructive Sleep Apnea Morbid obesity with obstructive sleep apnea. No longer uses CPAP. Due for outpatient follow-up with sleep physician. 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

## 2023-08-28 NOTE — ED Provider Notes (Signed)
 [place] EMERGENCY DEPARTMENT AT [facility] Provider Note  CSN: [id number] Arrival date & time: 08/28/23 6962  Chief Complaint(s) Palpitations and Shortness of Breath  HPI Nicole Gibbs is a 78 y.o. female history of PE on Coumadin, CHF, hypertension, presenting with shortness of breath.  Patient reports shortness of breath for about 1 week associated with generalized weakness.  Patient does not feel very short of breath at rest, but worsens with exertion.  Reports some dry cough.  No orthopnea.  No fevers or chills.  No nausea or vomiting.  No chest pain.  No lightheadedness or dizziness.  Reports some mild chronic leg swelling.  Reports compliance with all of her medications other than missing her warfarin dose last night.  Ultimately called paramedics who gave her 10 mg of IV diltiazem.   Past Medical History Past Medical History:  Diagnosis Date   Anxiety    Arthritis    knees   Chronic combined systolic and diastolic CHF (congestive heart failure) (HCC)    a. 07/2012 Echo: EF 55-60%, Gr1 DD;  b. 06/2015 Echo: EF 35-40%, triv AI, Mod MR, mod dil LA, mildly reduced RV.   Depression    DVT, bilateral lower limbs (HCC)    H/O cardiovascular stress test    a. 07/2012 MV: fixed mid-dist anterior and apical defect - scar vs attenuation, distal ant HK, no reversibility, EF 51%-->low risk.   Hx of blood clots    Hypertensive heart disease    Hypothyroidism    Joint pain    Nocturia    Normal coronary arteries    by cardiac catheterization 09/03/15   Obese    OSA on CPAP    Postmenopausal vaginal bleeding    Pulmonary embolism, bilateral (HCC)    a. chronic coumadin.   Rash    under breasts   Skin cancer    Sleep apnea    SOB (shortness of breath)    Swelling of both lower extremities    Thyroid nodule    no meds   Patient Active Problem List   Diagnosis Date Noted   AKI (acute kidney injury) (HCC) 08/28/2023   Hyperthyroidism 11/26/2020   Supratherapeutic INR  11/26/2020   Post-menopausal bleeding 06/17/2020   Lower extremity edema 06/17/2020   Hyperglycemia 04/03/2020   Prediabetes 03/17/2020   SOB (shortness of breath) on exertion 01/07/2020   Other fatigue 01/07/2020   OSA on CPAP 01/07/2020   Mood disorder (HCC) with emotional eating 01/07/2020   Class 3 severe obesity with serious comorbidity and body mass index (BMI) of 40.0 to 44.9 in adult [facility]) 01/07/2020   OSA (obstructive sleep apnea) 02/13/2019   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 01/22/2019   Insomnia 01/22/2019   Abnormal stress test    Systolic CHF, chronic (HCC) 07/06/2015   Acute on chronic combined systolic and diastolic congestive heart failure, NYHA class 3 (HCC)    Symptomatic anemia 07/03/2015   Thrombocytosis 07/03/2015   Hypertensive heart disease    Chronic combined systolic and diastolic CHF (congestive heart failure) (HCC)    Cough    GI bleed 05/06/2015   Acute GI bleeding 05/06/2015   Acute blood loss anemia 05/06/2015   History of pulmonary embolism 05/06/2015   Long term (current) use of anticoagulants 08/20/2012   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 Authorizing Provider  acetaminophen (TYLENOL) 325 MG tablet Take 650 mg by mouth every 6 (six) hours as needed for moderate pain.    [provider]  alendronate (FOSAMAX) 70 MG tablet Take 70 mg by mouth once a week. 04/19/22    [provider]  furosemide (LASIX) 40 MG tablet TAKE 1 TABLET BY MOUTH EVERY DAY 08/23/23   Avanell Leigh, MD  JARDIANCE 10 MG TABS tablet TAKE 1 TABLET(10 MG) BY MOUTH DAILY 07/10/23   Avanell Leigh, MD  losartan (COZAAR) 50 MG tablet TAKE 1 TABLET BY MOUTH EVERY DAY. 03/06/23   Avanell Leigh, MD  medroxyPROGESTERone (PROVERA) 5 MG tablet Take 5 mg by mouth daily in the afternoon. 08/26/21   [provider]  methimazole (TAPAZOLE) 5 MG tablet Take 5 mg by mouth daily.    [provider]  metoprolol succinate (TOPROL-XL) 100 MG 24 hr tablet Take 1 tablet (100 mg total) by mouth daily. Take with or immediately following a meal. 06/30/21   Monge, Nelva Bang, NP  nystatin cream (MYCOSTATIN) Apply 1 application topically 2 (two) times daily. 11/30/20   Darus Engels A, DO  spironolactone (ALDACTONE) 25 MG tablet TAKE 1 TABLET(25 MG) BY MOUTH DAILY 05/08/23   Avanell Leigh, MD  warfarin (COUMADIN) 3 MG tablet TAKE 1 TABLET BY MOUTH EVERY DAY AS DIRECTED BY COUMADIN CLINIC 07/04/23   Berry, Jonathan J, MD  zolpidem (AMBIEN) 5 MG tablet 1 tablet at bedtime. 12/21/20   [provider]                                                                                                                                    Past Surgical History Past Surgical History:  Procedure Laterality Date   CARDIAC CATHETERIZATION N/A 09/03/2015   Procedure: Left Heart Cath and Coronary Angiography;  Surgeon: Avanell Leigh, MD;  Location: [facility] INVASIVE CV LAB;  Service: Cardiovascular;  Laterality: N/A;   CARDIOVASCULAR STRESS TEST  09/01/2012  DR HILTY/ DR BERRY   LOW RISK NUCLEAR STUDY/ NO ISCHEMIA/ FIXED MID TO DISTAL ANTERIOR AND APICAL DEFECT MAY REPRESENT SCAR VS BREAST ATTENUATION ARTIFACT/  MILD DISTAL ANTERIOR HYPOKINESIS/ EF 51%   CESAREAN SECTION  1992   COLONOSCOPY N/A 05/08/2015   Procedure: COLONOSCOPY;  Surgeon: Jolinda Necessary, MD;  Location: [facility] ENDOSCOPY;  Service:  Endoscopy;  Laterality: N/A;   DILATION AND CURETTAGE OF UTERUS  06/20/2012   Procedure: DILATATION AND CURETTAGE;  Surgeon: Martine Sleek, MD;  Location: [facility] ORS;  Service: Gynecology;  Laterality:  N/A;   dilation and evacution  1988   missed ab   DILITATION & CURRETTAGE/HYSTROSCOPY WITH THERMACHOICE ABLATION N/A 09/04/2012   Procedure: DILATATION & CURETTAGE WITH THERMACHOICE ABLATION;  Surgeon: Martine Sleek, MD;  Location: [facility] [place];  Service: Gynecology;  Laterality: N/A;   ESOPHAGOGASTRODUODENOSCOPY N/A 05/07/2015   Procedure: ESOPHAGOGASTRODUODENOSCOPY (EGD);  Surgeon: Jolinda Necessary, MD;  Location: [facility] ENDOSCOPY;  Service: Endoscopy;  Laterality: N/A;   HYSTEROSCOPY WITH D & C  03-24-2009   TRANSTHORACIC ECHOCARDIOGRAM  08-08-2012   LVSF NORMAL/ EF 55-60%/  GRADE I DIASTOLIC DYSFUNCTION/ MILD AV AND MV REGURG./  RVSF MILDLY REDUCED   Family History Family History  Problem Relation Age of Onset   Coronary artery disease Father 42   Stroke Father    Diabetes Father    Heart disease Father    Hypertension Mother    Mental illness Mother        anxiety   Thyroid disease Mother    Sudden death Sister     Social History Social History   Tobacco Use   Smoking status: Never   Smokeless tobacco: Never  Substance Use Topics   Alcohol use: No   Drug use: No   Allergies Keflex [cephalexin] and Sulfa antibiotics  Review of Systems Review of Systems  All other systems reviewed and are negative.   Physical Exam Vital Signs  I have reviewed the triage vital signs BP 103/84 (BP Location: Left Arm)   Pulse 65   Temp (!) 97.5 F (36.4 C) (Oral)   Resp 18   Ht 5' (1.524 m)   Wt 99.8 kg   SpO2 99%   BMI 42.97 kg/m  Physical Exam Vitals and nursing note reviewed.  Constitutional:      General: She is not in acute distress.    Appearance: She is well-developed. She is obese.  HENT:     Head: Normocephalic and atraumatic.     Mouth/Throat:      Mouth: Mucous membranes are moist.  Eyes:     Pupils: Pupils are equal, round, and reactive to light.  Cardiovascular:     Rate and Rhythm: Tachycardia present. Rhythm irregular.     Heart sounds: No murmur heard. Pulmonary:     Effort: Pulmonary effort is normal. No respiratory distress.     Breath sounds: Normal breath sounds.  Abdominal:     General: Abdomen is flat.     Palpations: Abdomen is soft.     Tenderness: There is no abdominal tenderness.  Musculoskeletal:        General: No tenderness.     Right lower leg: No edema.     Left lower leg: No edema.  Skin:    General: Skin is warm and dry.  Neurological:     General: No focal deficit present.     Mental Status: She is alert. Mental status is at baseline.  Psychiatric:        Mood and Affect: Mood normal.        Behavior: Behavior normal.     ED Results and Treatments Labs (all labs ordered are listed, but only abnormal results are displayed) Labs Reviewed  BASIC METABOLIC PANEL WITH GFR - Abnormal; Notable for the following components:      Result Value   CO2 17 (*)    Glucose, Bld 136 (*)    BUN 42 (*)    Creatinine, Ser 1.81 (*)    Calcium 8.7 (*)    GFR,  Estimated 29 (*)    Anion gap 20 (*)    All other components within normal limits  CBC WITH DIFFERENTIAL/PLATELET - Abnormal; Notable for the following components:   WBC 11.5 (*)    RBC 5.24 (*)    Hemoglobin 15.3 (*)    HCT 48.8 (*)    nRBC 0.3 (*)    Neutro Abs 8.5 (*)    All other components within normal limits  BRAIN NATRIURETIC PEPTIDE - Abnormal; Notable for the following components:   B Natriuretic Peptide 700.4 (*)    All other components within normal limits  PROTIME-INR - Abnormal; Notable for the following components:   Prothrombin Time 34.4 (*)    INR 3.4 (*)    All other components within normal limits  MAGNESIUM - Abnormal; Notable for the following components:   Magnesium 2.6 (*)    All other components within normal limits   TROPONIN I (HIGH SENSITIVITY) - Abnormal; Notable for the following components:   Troponin I (High Sensitivity) 53 (*)    All other components within normal limits  TROPONIN I (HIGH SENSITIVITY)                                                                                                                          Radiology DG Chest Portable 1 View Result Date: 08/28/2023 CLINICAL DATA:  Shortness of breath. EXAM: PORTABLE CHEST 1 VIEW COMPARISON:  11/29/2020. FINDINGS: Low lung volume. There is mild diffuse pulmonary vascular congestion, likely accentuated by low lung volume. No frank pulmonary edema. Bilateral lung fields are clear. Bilateral costophrenic angles are clear. Stable cardio-mediastinal silhouette. No acute osseous abnormalities. The soft tissues are within normal limits. IMPRESSION: Mild diffuse pulmonary vascular congestion, likely accentuated by low lung volume. No frank pulmonary edema. Electronically Signed   By: Beula Brunswick M.D.   On: 08/28/2023 09:41    Pertinent labs & imaging results that were available during my care of the patient were reviewed by me and considered in my medical decision making (see MDM for details).  Medications Ordered in ED Medications  sodium chloride 0.9 % bolus 500 mL (0 mLs Intravenous Stopped 08/28/23 1037)  etomidate (AMIDATE) injection 10 mg (10 mg Intravenous Given 08/28/23 1028)  fentaNYL (SUBLIMAZE) injection 50 mcg (50 mcg Intravenous Given 08/28/23 1027)  Procedures .Critical Care  Performed by: Mordecai Applebaum, MD Authorized by: Mordecai Applebaum, MD   Critical care provider statement:    Critical care time (minutes):  30   Critical care was necessary to treat or prevent imminent or life-threatening deterioration of the following conditions:  Cardiac failure   Critical care was time spent  personally by me on the following activities:  Development of treatment plan with patient or surrogate, discussions with consultants, evaluation of patient's response to treatment, examination of patient, ordering and review of laboratory studies, ordering and review of radiographic studies, ordering and performing treatments and interventions, pulse oximetry, re-evaluation of patient's condition and review of old charts   Care discussed with: admitting provider   .Cardioversion  Date/Time: 08/28/2023 11:30 AM  Performed by: Mordecai Applebaum, MD Authorized by: Mordecai Applebaum, MD   Consent:    Consent obtained:  Verbal and written   Consent given by:  Patient   Risks discussed:  Cutaneous burn, death, induced arrhythmia and pain   Alternatives discussed:  No treatment, observation and rate-control medication Pre-procedure details:    Cardioversion basis:  Elective   Rhythm:  Atrial fibrillation   Electrode placement:  Anterior-posterior Patient sedated: Yes. Refer to sedation procedure documentation for details of sedation.  Attempt one:    Cardioversion mode:  Synchronous   Shock (Joules):  120   Shock outcome:  Conversion to normal sinus rhythm Post-procedure details:    Patient status:  Awake   Patient tolerance of procedure:  Tolerated well, no immediate complications .Sedation  Date/Time: 08/28/2023 11:30 AM  Performed by: Mordecai Applebaum, MD Authorized by: Mordecai Applebaum, MD   Consent:    Consent obtained:  Verbal   Consent given by:  Patient   Risks discussed:  Allergic reaction, dysrhythmia, inadequate sedation, nausea, prolonged hypoxia resulting in organ damage, prolonged sedation necessitating reversal, respiratory compromise necessitating ventilatory assistance and intubation and vomiting   Alternatives discussed:  Analgesia without sedation, anxiolysis and regional anesthesia Universal protocol:    Procedure explained and questions answered to patient  or proxy's satisfaction: yes     Relevant documents present and verified: yes     Test results available: yes     Imaging studies available: yes     Required blood products, implants, devices, and special equipment available: yes     Site/side marked: yes     Immediately prior to procedure, a time out was called: yes     Patient identity confirmed:  Verbally with patient Indications:    Procedure necessitating sedation performed by:  Physician performing sedation Pre-sedation assessment:    Time since last food or drink:  12 hour   ASA classification: class 3 - patient with severe systemic disease     Mouth opening:  3 or more finger widths   Thyromental distance:  4 finger widths   Mallampati score:  I - soft palate, uvula, fauces, pillars visible   Neck mobility: normal     Pre-sedation assessments completed and reviewed: airway patency, cardiovascular function, hydration status, mental status, nausea/vomiting, pain level, respiratory function and temperature   A pre-sedation assessment was completed prior to the start of the procedure Immediate pre-procedure details:    Reassessment: Patient reassessed immediately prior to procedure     Reviewed: vital signs, relevant labs/tests and NPO status     Verified: bag valve mask available, emergency equipment available, intubation equipment available, IV patency confirmed, oxygen available and suction available   Procedure  details (see MAR for exact dosages):    Preoxygenation:  Nasal cannula   Sedation:  Etomidate   Intended level of sedation: deep   Analgesia:  Fentanyl   Intra-procedure monitoring:  Blood pressure monitoring, cardiac monitor, continuous pulse oximetry, frequent LOC assessments, frequent vital sign checks and continuous capnometry   Intra-procedure events: none     Total Provider sedation time (minutes):  15 Post-procedure details:   A post-sedation assessment was completed following the completion of the procedure.    Attendance: Constant attendance by certified staff until patient recovered     Recovery: Patient returned to pre-procedure baseline     Post-sedation assessments completed and reviewed: airway patency, cardiovascular function, hydration status, mental status, nausea/vomiting, pain level, respiratory function and temperature     Patient is stable for discharge or admission: yes     Procedure completion:  Tolerated well, no immediate complications   (including critical care time)  Medical Decision Making / ED Course   MDM:  78 year old presenting to the emergency department shortness of breath.  Suspect shortness of breath is due to patient's A-fib.  She is in A-fib with RVR.  Heart rate around 160-180.  She does not have any symptoms of A-fib other than shortness of breath and has not noted any palpitations.  Discussed sedation and cardioversion versus rate control and patient would prefer sedation and cardioversion.  She reports that she takes warfarin daily and has been compliant.  Review of recent INR level shows therapeutic INR.  She did miss dose last night.  Will check PT/INR.  Otherwise denies any missed doses.  Discussed risks and benefits of sedation and cardioversion including risk of stroke.  If INR is subtherapeutic patient would be less eager to cardiovert patient.  Given shortness of breath, also consider other potential associated problems such as ACS, will check troponin, chest x-ray to evaluate for underlying pneumonia although patient denies productive cough or fever.  Doubt pneumothorax.  Doubt pulmonary embolism given chronic warfarin therapy.  Patient does not appear currently volume overloaded on exam.  She appears slightly dehydrated so we will give very small fluid bolus.  Will reassess. Clinical Course as of 08/28/23 1131  Mon Aug 28, 2023  1127 INR was therapeutic.  Patient only missed a dose last night.  Patient consented for cardioversion and sedation which was  performed uneventfully.  Repeat EKG shows sinus rhythm.  Blood pressure stable.  Labs were notable for acute kidney injury.  Patient looks more dehydrated than anything and reports less p.o. intake recently.  Given AKI, discussed with hospitalist who will admit patient for further monitoring.  Discussed with cardiology who will consult given initial presentation of A-fib with RVR. [WS]    Clinical Course User Index [WS] Lonell Grandchild, MD     Additional history obtained: -Additional history obtained from ems -External records from outside source obtained and reviewed including: Chart review including previous notes, labs, imaging, consultation notes including prior cardiology notes    Lab Tests: -I ordered, reviewed, and interpreted labs.   The pertinent results include:   Labs Reviewed  BASIC METABOLIC PANEL WITH GFR - Abnormal; Notable for the following components:      Result Value   CO2 17 (*)    Glucose, Bld 136 (*)    BUN 42 (*)    Creatinine, Ser 1.81 (*)    Calcium 8.7 (*)    GFR, Estimated 29 (*)    Anion gap 20 (*)    All  other components within normal limits  CBC WITH DIFFERENTIAL/PLATELET - Abnormal; Notable for the following components:   WBC 11.5 (*)    RBC 5.24 (*)    Hemoglobin 15.3 (*)    HCT 48.8 (*)    nRBC 0.3 (*)    Neutro Abs 8.5 (*)    All other components within normal limits  BRAIN NATRIURETIC PEPTIDE - Abnormal; Notable for the following components:   B Natriuretic Peptide 700.4 (*)    All other components within normal limits  PROTIME-INR - Abnormal; Notable for the following components:   Prothrombin Time 34.4 (*)    INR 3.4 (*)    All other components within normal limits  MAGNESIUM - Abnormal; Notable for the following components:   Magnesium 2.6 (*)    All other components within normal limits  TROPONIN I (HIGH SENSITIVITY) - Abnormal; Notable for the following components:   Troponin I (High Sensitivity) 53 (*)    All other components  within normal limits  TROPONIN I (HIGH SENSITIVITY)    Notable for elevated troponin, likely demand, AKI, elevated BNP though lower concern for CHF   EKG   EKG Interpretation Date/Time:  Monday August 28 2023 10:30:59 EDT Ventricular Rate:  97 PR Interval:  131 QRS Duration:  110 QT Interval:  361 QTC Calculation: 459 R Axis:   -8  Text Interpretation: Sinus rhythm Atrial premature complex Probable anterior infarct, age indeterminate Lateral leads are also involved Confirmed by Hiawatha Lout ([id number]) on 08/28/2023 10:35:53 AM         Imaging Studies ordered: I ordered imaging studies including CXR On my interpretation imaging demonstrates congestion I independently visualized and interpreted imaging. I agree with the radiologist interpretation   Medicines ordered and prescription drug management: Meds ordered this encounter  Medications   sodium chloride 0.9 % bolus 500 mL   etomidate (AMIDATE) injection 10 mg   fentaNYL (SUBLIMAZE) injection 50 mcg    -I have reviewed the patients home medicines and have made adjustments as needed   Consultations Obtained: I requested consultation with the hospitalist,  and discussed lab and imaging findings as well as pertinent plan - they recommend: admission   Cardiac Monitoring: The patient was maintained on a cardiac monitor.  I personally viewed and interpreted the cardiac monitored which showed an underlying rhythm of: afib with rvr  Social Determinants of Health:  Diagnosis or treatment significantly limited by social determinants of health: obesity and lives alone   Reevaluation: After the interventions noted above, I reevaluated the patient and found that their symptoms have improved  Co morbidities that complicate the patient evaluation  Past Medical History:  Diagnosis Date   Anxiety    Arthritis    knees   Chronic combined systolic and diastolic CHF (congestive heart failure) (HCC)    a. 07/2012 Echo: EF  55-60%, Gr1 DD;  b. 06/2015 Echo: EF 35-40%, triv AI, Mod MR, mod dil LA, mildly reduced RV.   Depression    DVT, bilateral lower limbs (HCC)    H/O cardiovascular stress test    a. 07/2012 MV: fixed mid-dist anterior and apical defect - scar vs attenuation, distal ant HK, no reversibility, EF 51%-->low risk.   Hx of blood clots    Hypertensive heart disease    Hypothyroidism    Joint pain    Nocturia    Normal coronary arteries    by cardiac catheterization 09/03/15   Obese    OSA on CPAP    Postmenopausal  vaginal bleeding    Pulmonary embolism, bilateral (HCC)    a. chronic coumadin.   Rash    under breasts   Skin cancer    Sleep apnea    SOB (shortness of breath)    Swelling of both lower extremities    Thyroid nodule    no meds      Dispostion: Disposition decision including need for hospitalization was considered, and patient admitted to the hospital.    Final Clinical Impression(s) / ED Diagnoses Final diagnoses:  Atrial fibrillation with rapid ventricular response (HCC)  AKI (acute kidney injury) (HCC)     This chart was dictated using voice recognition software.  Despite best efforts to proofread,  errors can occur which can change the documentation meaning.    Mordecai Applebaum, MD 08/28/23 [phone]

## 2023-08-28 NOTE — Progress Notes (Signed)
 Patient went into SVT rates 130s at 2045. Valsalva maneuver x 3, patient back into SR. See note for vitals. Scheduled metoprolol given. Will alert MD by page.

## 2023-08-28 NOTE — ED Notes (Signed)
 Notified provider of lactic of 2.6

## 2023-08-28 NOTE — ED Notes (Addendum)
 [phone] daughter

## 2023-08-28 NOTE — H&P (Addendum)
 History and Physical    Patient: Nicole Gibbs ZOX:[id number] DOB: [birth year] DOA: 08/28/2023 DOS: the patient was seen and examined on 08/28/2023 PCP: [name], FNP  Patient coming from: Home  Chief Complaint:  Chief Complaint  Patient presents with   Palpitations   Shortness of Breath   HPI: [name] is a 78 y.o. female with medical history significant of chronic combined systolic and diastolic HF, hx of DVT/PE on coumadin, hypertension, hyperthyroidism, obesity, OSA on CPAP presenting to the ED with shortness of breath with exertion.  Patient reports that she has noted shortness of breath with exertion over the past week but it has worsened since this past Saturday.  States that she gets short of breath even with just walking around the house or climbing stairs.  She otherwise has been feeling fine.  She denies any nausea, vomiting, fevers, chills, chest pain, palpitations, cough, abdominal pain, urinary changes.  She does report adherence with her home medication regimen.  She states that she has had decreased water intake over the past few weeks or so.  States that she no longer drinks as much water as she used to.  Her appetite has been okay.  ED course: Initial vital signs notable for tachycardia, tachypnea, and new oxygen requirement of 3 L [place].  EKG revealed atrial fibrillation with RVR.  Given that patient has been on Coumadin therapy long-term, she was cardioverted in the ED.  Subsequent vital signs stable aside from mildly low blood pressures in the 100s/70s.  Her oxygen saturations also improved though she was kept on 3 L [place] for comfort.  Repeat EKG did show normal sinus rhythm.  CBC with mild leukocytosis, otherwise unremarkable.  BMP with glucose 136, creatinine 1.81 (baseline creatinine around 0.8).  BNP 700.  Initial troponin 53, awaiting repeat.  INR 3.4.  Cardiology consulted by ED provider.  [facility] hospitalist asked to evaluate patient for admission.  Review of  Systems: As mentioned in the history of present illness. All other systems reviewed and are negative. Past Medical History:  Diagnosis Date   Anxiety    Arthritis    knees   Chronic combined systolic and diastolic CHF (congestive heart failure) (HCC)    a. 07/2012 Echo: EF 55-60%, Gr1 DD;  b. 06/2015 Echo: EF 35-40%, triv AI, Mod MR, mod dil LA, mildly reduced RV.   Depression    DVT, bilateral lower limbs (HCC)    H/O cardiovascular stress test    a. 07/2012 MV: fixed mid-dist anterior and apical defect - scar vs attenuation, distal ant HK, no reversibility, EF 51%-->low risk.   Hx of blood clots    Hypertensive heart disease    Hypothyroidism    Joint pain    Nocturia    Normal coronary arteries    by cardiac catheterization 09/03/15   Obese    OSA on CPAP    Postmenopausal vaginal bleeding    Pulmonary embolism, bilateral (HCC)    a. chronic coumadin.   Rash    under breasts   Skin cancer    Sleep apnea    SOB (shortness of breath)    Swelling of both lower extremities    Thyroid nodule    no meds   Past Surgical History:  Procedure Laterality Date   CARDIAC CATHETERIZATION N/A 09/03/2015   Procedure: Left Heart Cath and Coronary Angiography;  Surgeon: Avanell Leigh, MD;  Location: [facility] INVASIVE CV LAB;  Service: Cardiovascular;  Laterality: N/A;   CARDIOVASCULAR STRESS  TEST  08-07-2012  DR HILTY/ DR BERRY   LOW RISK NUCLEAR STUDY/ NO ISCHEMIA/ FIXED MID TO DISTAL ANTERIOR AND APICAL DEFECT MAY REPRESENT SCAR VS BREAST ATTENUATION ARTIFACT/  MILD DISTAL ANTERIOR HYPOKINESIS/ EF 51%   CESAREAN SECTION  1992   COLONOSCOPY N/A 05/08/2015   Procedure: COLONOSCOPY;  Surgeon: Carman Ching, MD;  Location: [facility] ENDOSCOPY;  Service: Endoscopy;  Laterality: N/A;   DILATION AND CURETTAGE OF UTERUS  06/20/2012   Procedure: DILATATION AND CURETTAGE;  Surgeon: Jeani Hawking, MD;  Location: [facility] ORS;  Service: Gynecology;  Laterality: N/A;   dilation and evacution  1988   missed ab    DILITATION & CURRETTAGE/HYSTROSCOPY WITH THERMACHOICE ABLATION N/A 09/04/2012   Procedure: DILATATION & CURETTAGE WITH THERMACHOICE ABLATION;  Surgeon: Jeani Hawking, MD;  Location: [facility];  Service: Gynecology;  Laterality: N/A;   ESOPHAGOGASTRODUODENOSCOPY N/A 05/07/2015   Procedure: ESOPHAGOGASTRODUODENOSCOPY (EGD);  Surgeon: Carman Ching, MD;  Location: [facility] ENDOSCOPY;  Service: Endoscopy;  Laterality: N/A;   HYSTEROSCOPY WITH D & C  03-24-2009   TRANSTHORACIC ECHOCARDIOGRAM  08-08-2012   LVSF NORMAL/ EF 55-60%/  GRADE I DIASTOLIC DYSFUNCTION/ MILD AV AND MV REGURG./  RVSF MILDLY REDUCED   Social History:  reports that she has never smoked. She has never used smokeless tobacco. She reports that she does not drink alcohol and does not use drugs.  Allergies  Allergen Reactions   Keflex [Cephalexin] Diarrhea   Sulfa Antibiotics Hives    Family History  Problem Relation Age of Onset   Coronary artery disease Father 83   Stroke Father    Diabetes Father    Heart disease Father    Hypertension Mother    Mental illness Mother        anxiety   Thyroid disease Mother    Sudden death Sister     Prior to Admission medications   Medication Sig Start Date End Date Taking? Authorizing Provider  acetaminophen (TYLENOL) 325 MG tablet Take 650 mg by mouth every 6 (six) hours as needed for moderate pain.    [provider]  alendronate (FOSAMAX) 70 MG tablet Take 70 mg by mouth once a week. 04/19/22   [provider]  furosemide (LASIX) 40 MG tablet TAKE 1 TABLET BY MOUTH EVERY DAY 08/23/23   Runell Gess, MD  JARDIANCE 10 MG TABS tablet TAKE 1 TABLET(10 MG) BY MOUTH DAILY 07/10/23   Runell Gess, MD  losartan (COZAAR) 50 MG tablet TAKE 1 TABLET BY MOUTH EVERY DAY. 03/06/23   Runell Gess, MD  medroxyPROGESTERone (PROVERA) 5 MG tablet Take 5 mg by mouth daily in the afternoon. 08/26/21   [provider]  methimazole (TAPAZOLE) 5 MG  tablet Take 5 mg by mouth daily.    [provider]  metoprolol succinate (TOPROL-XL) 100 MG 24 hr tablet Take 1 tablet (100 mg total) by mouth daily. Take with or immediately following a meal. 06/30/21   Monge, Petra Kuba, NP  nystatin cream (MYCOSTATIN) Apply 1 application topically 2 (two) times daily. 11/30/20   Esaw Grandchild A, DO  spironolactone (ALDACTONE) 25 MG tablet TAKE 1 TABLET(25 MG) BY MOUTH DAILY 05/08/23   Runell Gess, MD  warfarin (COUMADIN) 3 MG tablet TAKE 1 TABLET BY MOUTH EVERY DAY AS DIRECTED BY COUMADIN CLINIC 07/04/23   Runell Gess, MD  zolpidem (AMBIEN) 5 MG tablet 1 tablet at bedtime. 12/21/20   [provider]    Physical Exam: Vitals:  08/28/23 1045 08/28/23 1048 08/28/23 1100 08/28/23 1130  BP: 105/74 103/84 103/66 (!) 101/55  Pulse: 65 65 70 72  Resp: (!) 30 18 (!) 21 (!) 29  Temp:  (!) 97.5 F (36.4 C)    TempSrc:  Oral    SpO2: 99% 99% 99% 99%  Weight:      Height:       Physical Exam Constitutional:      Appearance: She is well-developed. She is obese. She is not ill-appearing.  HENT:     Head: Normocephalic and atraumatic.     Mouth/Throat:     Mouth: Mucous membranes are dry.     Pharynx: Oropharynx is clear. No oropharyngeal exudate.  Eyes:     General: No scleral icterus.    Extraocular Movements: Extraocular movements intact.     Pupils: Pupils are equal, round, and reactive to light.  Cardiovascular:     Rate and Rhythm: Normal rate and regular rhythm.     Heart sounds: Normal heart sounds. No murmur heard.    No friction rub. No gallop.  Pulmonary:     Effort: Pulmonary effort is normal. No respiratory distress.     Breath sounds: Normal breath sounds. No wheezing, rhonchi or rales.  Abdominal:     General: Bowel sounds are normal. There is no distension.     Palpations: Abdomen is soft.     Tenderness: There is no abdominal tenderness. There is no guarding or rebound.  Musculoskeletal:        General:  Normal range of motion.     Cervical back: Normal range of motion.     Comments: Chronic nonpitting LE swelling  Skin:    General: Skin is warm and dry.     Comments: Decreased central skin turgor.  Neurological:     General: No focal deficit present.     Mental Status: She is alert and oriented to person, place, and time.  Psychiatric:        Mood and Affect: Mood normal.        Behavior: Behavior normal.     Data Reviewed:  There are no new results to review at this time.    Latest Ref Rng & Units 08/28/2023    8:50 AM 12/30/2021    9:25 AM 11/30/2020    4:05 AM  CBC  WBC 4.0 - 10.5 K/uL 11.5  6.1  12.1   Hemoglobin 12.0 - 15.0 g/dL 60.4  54.0  98.1   Hematocrit 36.0 - 46.0 % 48.8  44.1  45.8   Platelets 150 - 400 K/uL 332  326  284       Latest Ref Rng & Units 08/28/2023    8:50 AM 12/30/2021    9:25 AM 06/30/2021    4:11 PM  CMP  Glucose 70 - 99 mg/dL 191  478  295   BUN 8 - 23 mg/dL 42  15  14   Creatinine 0.44 - 1.00 mg/dL 6.21  3.08  6.57   Sodium 135 - 145 mmol/L 137  142  141   Potassium 3.5 - 5.1 mmol/L 4.5  4.1  3.9   Chloride 98 - 111 mmol/L 100  104  103   CO2 22 - 32 mmol/L 17  26  21    Calcium 8.9 - 10.3 mg/dL 8.7  9.0  9.1   Total Protein 6.0 - 8.5 g/dL  6.6    Total Bilirubin 0.0 - 1.2 mg/dL  0.6    Alkaline  Phos 44 - 121 IU/L  78    AST 0 - 40 IU/L  16    ALT 0 - 32 IU/L  9     BNP (last 3 results) Recent Labs    08/28/23 0850  BNP 700.4*   DG Chest Portable 1 View Result Date: 08/28/2023 CLINICAL DATA:  Shortness of breath. EXAM: PORTABLE CHEST 1 VIEW COMPARISON:  11/29/2020. FINDINGS: Low lung volume. There is mild diffuse pulmonary vascular congestion, likely accentuated by low lung volume. No frank pulmonary edema. Bilateral lung fields are clear. Bilateral costophrenic angles are clear. Stable cardio-mediastinal silhouette. No acute osseous abnormalities. The soft tissues are within normal limits. IMPRESSION: Mild diffuse pulmonary vascular  congestion, likely accentuated by low lung volume. No frank pulmonary edema. Electronically Signed   By: Beula Brunswick M.D.   On: 08/28/2023 09:41     Assessment and Plan: No notes have been filed under this hospital service. Service: Hospitalist  Atrial Fibrillation with RVR Patient presented with shortness of breath with exertion, found to be in A-fib with RVR on arrival.  Given that she has already been on anticoagulation with Coumadin for history of DVT/PE, patient underwent cardioversion in the ED.  She is currently in normal sinus rhythm.  Cardiology has been consulted by the ED provider.  Will continue her Coumadin per pharmacy. - Cardiology consulted, appreciate assistance - Resume home Coumadin per pharmacy - Holding home Toprol-XL given mild hypotension - Telemetry - supplemental O2 prn to maintain O2 sats >92%, wean to RA as tolerated - Place in observation/telemetry  AKI Patient with baseline creatinine around 0.8.  On admission, creatinine 1.81.  She does report decreased oral fluid intake and does appear slightly dry on exam.  She does have an elevated BNP but this appears to be around her baseline given prior values are similar.  She has received 500 cc IV fluids by the ED provider.  Will obtain renal ultrasound.  Given she is on Lasix in the outpatient setting, likely would have low utility for urine lytes. -Status post 500 cc IV fluid bolus - Follow-up renal ultrasound - Monitor urine output - Trend kidney function - Avoid nephrotoxic medications as able  Hypotension Hx of HTN Patient on Jardiance 10 mg daily, losartan 50 mg daily, Toprol-XL 100 mg daily, spironolactone 25 mg daily at home.  After undergoing cardioversion in the ED, her blood pressures have sustained in the 100s/50s-70s. Her MAPs have remained >65. Will hold her home antihypertensives and trend BP curve. - Holding home antihypertensives - Trend BP curve, goal MAP greater than 65  NSTEMI -troponin 53,  awaiting repeat -suspect demand ischemia given presentation with AF with RVR -EKG after cardioversion showing normal sinus rhythm, no obvious ischemic changes noted -cardiology has been consulted by ED provider -patient denies any current chest pain   Hx of DVT/PE -resume coumadin per pharmacy -holding home toprol-xl given mild hypotension  Chronic combined systolic and diastolic HF Patient with last echo showing LVEF 40 to 45% (improved from 20 to 25% in the past), mildly decreased LV function, moderate LVH, grade 1 diastolic dysfunction.  She is on Lasix 40 mg daily, Jardiance 10 mg daily, losartan 50 mg daily, Toprol-XL 100 mg daily, spironolactone 25 mg daily at home.  Her BNP is elevated at 700 though this may be around her baseline per chart review. She has some nonpitting edema on exam, but reports that this is chronic. CXR shows mild vascular congestion but no crackles on exam. She  does appear dry on exam and has received 500cc IVF bolus in ED. Will hold her home lasix while providing trial of IVF. Holding home jardiance, losartan, and spironolactone given AKI. Holding home toprol-xl given mild hypotension. -holding home jardiance, losartan, spiro, toprol-xl as noted above -s/p 500cc IVF bolus in ED -holding home lasix -strict I/O's, daily weights  Hyperthyroidism Unclear if this played a role in precipitating her AF with RVR. She was previously on methimazole 5mg  daily but reports that her provider discontinued this as her TSH was normal. Will check TSH and free T4. -f/u TSH and free T4  ADDENDUM: TSH normal but free T4 elevated at 1.51. Will start her back on methimazole 5mg  daily. Question if hyperthyroid state may have precipitated AF with RVR.  OSA  -resume CPAP qhs   Advance Care Planning:   Code Status: Full Code   Consults: cardiology  Family Communication: no family at bedside  Severity of Illness: The appropriate patient status for this patient is OBSERVATION.  Observation status is judged to be reasonable and necessary in order to provide the required intensity of service to ensure the patient's safety. The patient's presenting symptoms, physical exam findings, and initial radiographic and laboratory data in the context of their medical condition is felt to place them at decreased risk for further clinical deterioration. Furthermore, it is anticipated that the patient will be medically stable for discharge from the hospital within 2 midnights of admission.   Portions of this note were generated with Scientist, clinical (histocompatibility and immunogenetics). Dictation errors may occur despite best attempts at proofreading.  Author: [place] Luppino H Deja Pisarski, MD 08/28/2023 12:01 PM  For on call review [email or web address].

## 2023-08-28 NOTE — Telephone Encounter (Signed)
   Patient called Answering Service this morning with concerns of shortness of breath. She states she has had malaise and shortness of breath for the last week but shortness of breath has been getting progressively worse. She states she now cannot walk 5 feet without getting short of breath. She denies any clear orthopnea or PND but states she does not sleep much. She reports lower extremity edema but denies any weight gain. No chest pain. At times, she is noticeably short of breath today. She does have a history of DVT/ PE but recent INR have all been therapeutic (a couple have been supra-therapeutic). I think she needs to be seen today in our office or in the ED. Do not know if we can get her into the office today. Patient would prefer to go to the ED anyway. She does not have anyone who can drive her to the ED and states she has not driven over the last week due to her shortness of breath. Therefore, recommended she call 911. Patient voiced understanding and agreed.  Laporscha Linehan E Kiaria Quinnell, PA-C 08/28/2023 7:40 AM

## 2023-08-28 NOTE — ED Notes (Signed)
 CCM called for tele admit.

## 2023-08-29 DIAGNOSIS — E0590 Thyrotoxicosis, unspecified without thyrotoxic crisis or storm: Secondary | ICD-10-CM | POA: Diagnosis present

## 2023-08-29 DIAGNOSIS — I2489 Other forms of acute ischemic heart disease: Secondary | ICD-10-CM | POA: Diagnosis present

## 2023-08-29 DIAGNOSIS — J9601 Acute respiratory failure with hypoxia: Secondary | ICD-10-CM | POA: Diagnosis present

## 2023-08-29 DIAGNOSIS — I110 Hypertensive heart disease with heart failure: Secondary | ICD-10-CM | POA: Diagnosis present

## 2023-08-29 DIAGNOSIS — I5043 Acute on chronic combined systolic (congestive) and diastolic (congestive) heart failure: Secondary | ICD-10-CM | POA: Diagnosis present

## 2023-08-29 DIAGNOSIS — I493 Ventricular premature depolarization: Secondary | ICD-10-CM | POA: Diagnosis present

## 2023-08-29 DIAGNOSIS — I959 Hypotension, unspecified: Secondary | ICD-10-CM | POA: Diagnosis present

## 2023-08-29 DIAGNOSIS — Z79899 Other long term (current) drug therapy: Secondary | ICD-10-CM | POA: Diagnosis not present

## 2023-08-29 DIAGNOSIS — Z6841 Body Mass Index (BMI) 40.0 and over, adult: Secondary | ICD-10-CM | POA: Diagnosis not present

## 2023-08-29 DIAGNOSIS — I4891 Unspecified atrial fibrillation: Secondary | ICD-10-CM | POA: Diagnosis present

## 2023-08-29 DIAGNOSIS — Z7983 Long term (current) use of bisphosphonates: Secondary | ICD-10-CM | POA: Diagnosis not present

## 2023-08-29 DIAGNOSIS — I471 Supraventricular tachycardia, unspecified: Secondary | ICD-10-CM | POA: Diagnosis present

## 2023-08-29 DIAGNOSIS — Z86711 Personal history of pulmonary embolism: Secondary | ICD-10-CM | POA: Diagnosis not present

## 2023-08-29 DIAGNOSIS — I491 Atrial premature depolarization: Secondary | ICD-10-CM | POA: Diagnosis present

## 2023-08-29 DIAGNOSIS — Z86718 Personal history of other venous thrombosis and embolism: Secondary | ICD-10-CM | POA: Diagnosis not present

## 2023-08-29 DIAGNOSIS — Z7901 Long term (current) use of anticoagulants: Secondary | ICD-10-CM | POA: Diagnosis not present

## 2023-08-29 DIAGNOSIS — E872 Acidosis, unspecified: Secondary | ICD-10-CM | POA: Diagnosis present

## 2023-08-29 DIAGNOSIS — N179 Acute kidney failure, unspecified: Secondary | ICD-10-CM | POA: Diagnosis present

## 2023-08-29 DIAGNOSIS — Z7984 Long term (current) use of oral hypoglycemic drugs: Secondary | ICD-10-CM | POA: Diagnosis not present

## 2023-08-29 DIAGNOSIS — I472 Ventricular tachycardia, unspecified: Secondary | ICD-10-CM | POA: Diagnosis present

## 2023-08-29 DIAGNOSIS — G4733 Obstructive sleep apnea (adult) (pediatric): Secondary | ICD-10-CM | POA: Diagnosis present

## 2023-08-29 DIAGNOSIS — I428 Other cardiomyopathies: Secondary | ICD-10-CM | POA: Diagnosis present

## 2023-08-29 LAB — URINALYSIS, ROUTINE W REFLEX MICROSCOPIC
Bilirubin Urine: NEGATIVE
Glucose, UA: >=500 mg/dL — AB
Hgb urine dipstick: NEGATIVE
Ketones, ur: NEGATIVE mg/dL
Nitrite: NEGATIVE
Protein, ur: NEGATIVE mg/dL
Specific Gravity, Urine: 1.015 (ref 1.005–1.030)
pH: 5 (ref 5.0–8.0)

## 2023-08-29 LAB — CBC
HCT: 43.8 % (ref 36.0–46.0)
Hemoglobin: 14.2 g/dL (ref 12.0–15.0)
MCH: 29.2 pg (ref 26.0–34.0)
MCHC: 32.4 g/dL (ref 30.0–36.0)
MCV: 90.1 fL (ref 80.0–100.0)
Platelets: 285 10*3/uL (ref 150–400)
RBC: 4.86 MIL/uL (ref 3.87–5.11)
RDW: 14.5 % (ref 11.5–15.5)
WBC: 10.8 10*3/uL — ABNORMAL HIGH (ref 4.0–10.5)
nRBC: 0.2 % (ref 0.0–0.2)

## 2023-08-29 LAB — PROTIME-INR
INR: 3.2 — ABNORMAL HIGH (ref 0.8–1.2)
Prothrombin Time: 33 s — ABNORMAL HIGH (ref 11.4–15.2)

## 2023-08-29 LAB — BASIC METABOLIC PANEL WITH GFR
Anion gap: 12 (ref 5–15)
BUN: 43 mg/dL — ABNORMAL HIGH (ref 8–23)
CO2: 19 mmol/L — ABNORMAL LOW (ref 22–32)
Calcium: 8 mg/dL — ABNORMAL LOW (ref 8.9–10.3)
Chloride: 109 mmol/L (ref 98–111)
Creatinine, Ser: 1.48 mg/dL — ABNORMAL HIGH (ref 0.44–1.00)
GFR, Estimated: 37 mL/min — ABNORMAL LOW (ref >60)
Glucose, Bld: 106 mg/dL — ABNORMAL HIGH (ref 70–99)
Potassium: 3.9 mmol/L (ref 3.5–5.1)
Sodium: 140 mmol/L (ref 135–145)

## 2023-08-29 MED ORDER — WARFARIN SODIUM 1 MG PO TABS
1.0000 mg | ORAL_TABLET | Freq: Once | ORAL | Status: AC
  Administered 2023-08-29: 1 mg via ORAL
  Filled 2023-08-29: qty 1

## 2023-08-29 MED ORDER — METOPROLOL TARTRATE 25 MG PO TABS
25.0000 mg | ORAL_TABLET | Freq: Two times a day (BID) | ORAL | Status: DC
  Administered 2023-08-29 – 2023-08-31 (×5): 25 mg via ORAL
  Filled 2023-08-29 (×5): qty 1

## 2023-08-29 MED ORDER — POTASSIUM CHLORIDE 20 MEQ PO PACK
20.0000 meq | PACK | Freq: Once | ORAL | Status: AC
  Administered 2023-08-29: 20 meq via ORAL
  Filled 2023-08-29: qty 1

## 2023-08-29 MED ORDER — POTASSIUM CHLORIDE 20 MEQ PO PACK
40.0000 meq | PACK | Freq: Every day | ORAL | Status: DC
  Administered 2023-08-29 – 2023-08-31 (×3): 40 meq via ORAL
  Filled 2023-08-29 (×3): qty 2

## 2023-08-29 NOTE — Plan of Care (Signed)
   Problem: Education: Goal: Knowledge of General Education information will improve Description: Including pain rating scale, medication(s)/side effects and non-pharmacologic comfort measures Outcome: Progressing   Problem: Clinical Measurements: Goal: Respiratory complications will improve Outcome: Progressing   Problem: Activity: Goal: Risk for activity intolerance will decrease Outcome: Progressing

## 2023-08-29 NOTE — TOC Initial Note (Signed)
 Transition of Care [facility]) - Initial/Assessment Note    Patient Details  Name: Nicole Gibbs MRN: [id number] Date of Birth: [birth year]  [facility] CM/SW Contact:    Marliss Coots, LCSW Phone Number: 08/29/2023, 3:32 PM  Clinical Narrative:                  3:32 PM CSW introduced self and role to patient at bedside. Patient stated that she has DME at home (potty chair, walker, cane) as well as wheelchairs and a broke stair chair that was deceased spouse's. Patient informed CSW that she is able to drive self but expressed interest in transportation and home health for discharge as she does not have friends or family in the area to assist with that or home support. Patient stated that she would be able to pay for a taxi/Uber upon discharge if medically appropriate. CSW informed patient of need of therapy evaluations/recommendations priors to establishing home health. Patient expressed understanding of this information. Patient stated she has used home health in the past but did not remember the agency's name. Patient declined SNF history. Patient informed CSW that she resides in a two-story home alone.  Expected Discharge Plan: Home/Self Care Barriers to Discharge: Continued Medical Work up   Patient Goals and CMS Choice Patient states their goals for this hospitalization and ongoing recovery are:: to return home          Expected Discharge Plan and Services       Living arrangements for the past 2 months: Single Family Home                                      Prior Living Arrangements/Services Living arrangements for the past 2 months: Single Family Home Lives with:: Self Patient language and need for interpreter reviewed:: Yes Do you feel safe going back to the place where you live?: Yes            Criminal Activity/Legal Involvement Pertinent to Current Situation/Hospitalization: No - Comment as needed  Activities of Daily Living   ADL Screening  (condition at time of admission) Independently performs ADLs?: Yes (appropriate for developmental age) Is the patient deaf or have difficulty hearing?: No Does the patient have difficulty seeing, even when wearing glasses/contacts?: No Does the patient have difficulty concentrating, remembering, or making decisions?: No  Permission Sought/Granted Permission sought to share information with : Family Supports Permission granted to share information with : No (Contact information on chart)  Share Information with NAME: Halimah Bewick     Permission granted to share info w Relationship: Sister  Permission granted to share info w Contact Information: [phone]  Emotional Assessment Appearance:: Appears stated age Attitude/Demeanor/Rapport: Engaged Affect (typically observed): Accepting, Appropriate, Adaptable, Calm, Stable, Pleasant Orientation: : Oriented to Self, Oriented to Place, Oriented to  Time, Oriented to Situation Alcohol / Substance Use: Not Applicable Psych Involvement: No (comment)  Admission diagnosis:  Atrial fibrillation with rapid ventricular response (HCC) [I48.91] AKI (acute kidney injury) (HCC) [N17.9] Atrial fibrillation with RVR (HCC) [I48.91] Patient Active Problem List   Diagnosis Date Noted   Atrial fibrillation with RVR (HCC) 08/29/2023   AKI (acute kidney injury) (HCC) 08/28/2023   Hyperthyroidism 11/26/2020   Supratherapeutic INR 11/26/2020   Post-menopausal bleeding 06/17/2020   Lower extremity edema 06/17/2020   Hyperglycemia 04/03/2020   Prediabetes 03/17/2020   SOB (shortness of breath) on exertion 01/07/2020  Other fatigue 01/07/2020   OSA on CPAP 01/07/2020   Mood disorder (HCC) with emotional eating 01/07/2020   Class 3 severe obesity with serious comorbidity and body mass index (BMI) of 40.0 to 44.9 in adult (HCC) 01/07/2020   OSA (obstructive sleep apnea) 02/13/2019   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 01/22/2019   Insomnia 01/22/2019   Abnormal stress  test    Systolic CHF, chronic (HCC) 07/06/2015   Acute on chronic combined systolic and diastolic congestive heart failure, NYHA class 3 (HCC)    Symptomatic anemia 07/03/2015   Thrombocytosis 07/03/2015   Hypertensive heart disease    Chronic combined systolic and diastolic CHF (congestive heart failure) (HCC)    Cough    GI bleed 05/06/2015   Acute GI bleeding 05/06/2015   Acute blood loss anemia 05/06/2015   History of pulmonary embolism 05/06/2015   Long term (current) use of anticoagulants 08/20/2012   Deep vein thrombosis (DVT) of both lower extremities (HCC) 08/20/2012   Vaginal bleeding- s/p urgent D&C Feb 5th 2014 08/08/2012   Obesity, unspecified 08/08/2012   Dyspnea on exertion 08/08/2012   DVT, bilat - office dopplers 08-21-2012 August 21, 2012   Pulmonary embolism, Bilateral 08-21-2012   Essential hypertension 08-21-12   Nonspecific abnormal  (EKG), negative nucluear stress test 21-Aug-2012 Aug 21, 2012   Family history of coronary artery bypass surgery 2012/08/21   Family history of sudden death (sister) 08/21/12   PCP:  [name], FNP Pharmacy:   [id number] [place], [place] - [address] AT [facility] [address] [place] [address] Phone: [phone] Fax: [phone]     Social Drivers of Health (SDOH) Social History: SDOH Screenings   Food Insecurity: No Food Insecurity (08/29/2023)  Housing: Low Risk  (04/05/2021)  Transportation Needs: No Transportation Needs (08/29/2023)  Depression (PHQ2-9): Medium Risk (01/07/2020)  Financial Resource Strain: Low Risk  (04/05/2021)  Tobacco Use: Low Risk  (08/28/2023)   SDOH Interventions:     Readmission Risk Interventions     No data to display

## 2023-08-29 NOTE — Care Management Obs Status (Signed)
 MEDICARE OBSERVATION STATUS NOTIFICATION   Patient Details  Name: Nicole Gibbs MRN: [id number] Date of Birth: Oct 03, 1945   Medicare Observation Status Notification Given:  Yes Moon/Obs letter signed and copy left   Wynonia Hedges 08/29/2023, 12:31 PM

## 2023-08-29 NOTE — Progress Notes (Signed)
 Progress Note  Patient Name: Nicole Gibbs Date of Encounter: 08/29/2023 Primary Cardiologist: Nanetta Batty, MD   Subjective   Overnight patient was told she had fast heart rate. She is now worried she will die. Breathing has improved but she has many concerns about dying of atrial fibrillation.  Vital Signs    Vitals:   08/28/23 2346 08/29/23 0144 08/29/23 0604 08/29/23 0727  BP: 128/79   (!) 107/57  Pulse: 79 78 78 76  Resp: 20 (!) 27 (!) 30 (!) 23  Temp: 98.1 F (36.7 C)   98.5 F (36.9 C)  TempSrc: Oral   Axillary  SpO2: 96% 91% 100% 93%  Weight:   99.7 kg   Height:        Intake/Output Summary (Last 24 hours) at 08/29/2023 0913 Last data filed at 08/29/2023 0732 Gross per 24 hour  Intake 360 ml  Output 1100 ml  Net -740 ml   Filed Weights   08/28/23 0849 08/29/23 0604  Weight: 99.8 kg 99.7 kg    Physical Exam   GEN: Mild distress Neck: + JVD Cardiac: RRR, no murmurs, rubs, or gallops.  Respiratory: Decreased breath sounds; tachypnea GI: Soft, nontender, non-distended  Nicole: Non pitting edema edema  Labs  EKG: Pending Telemetry: SR with PVCs, Parxosymal SVT, and NSVT (one episode of polymorphic NSVT   Chemistry Recent Labs  Lab 08/28/23 0850 08/28/23 2132 08/29/23 0218  NA 137 140 140  K 4.5 3.7 3.9  CL 100 109 109  CO2 17* 20* 19*  GLUCOSE 136* 139* 106*  BUN 42* 42* 43*  CREATININE 1.81* 1.68* 1.48*  CALCIUM 8.7* 8.1* 8.0*  GFRNONAA 29* 32* 37*  ANIONGAP 20* 11 12     Hematology Recent Labs  Lab 08/28/23 0850 08/29/23 0218  WBC 11.5* 10.8*  RBC 5.24* 4.86  HGB 15.3* 14.2  HCT 48.8* 43.8  MCV 93.1 90.1  MCH 29.2 29.2  MCHC 31.4 32.4  RDW 14.3 14.5  PLT 332 285    Cardiac EnzymesNo results for input(s): "TROPONINI" in the last 168 hours. No results for input(s): "TROPIPOC" in the last 168 hours.   BNP Recent Labs  Lab 08/28/23 0850  BNP 700.4*     DDimer No results for input(s): "DDIMER" in the last 168 hours.    Cardiac Studies   Cardiac Studies & Procedures   ______________________________________________________________________________________________ CARDIAC CATHETERIZATION  CARDIAC CATHETERIZATION 09/03/2015  Conclusion Images from the original result were not included.  There is moderate left ventricular systolic dysfunction.  Nicole Gibbs is a 78 y.o. female   [id number] LOCATION:  FACILITY: [facility] PHYSICIAN: Nanetta Batty, M.D. 1946/02/24   DATE OF PROCEDURE:  09/03/2015  DATE OF DISCHARGE:     CARDIAC CATHETERIZATION    History obtained from chart review. 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 She has had a decline in her LV function with a stress test that suggested new scar She presents now for diagnostic coronary arteriography to define her anatomy and rule out an ischemic etiology.    IMPRESSION:Nicole Gibbs has a nonischemic cardiomyopathy with normal coronary arteries and moderate global LV dysfunction.Continue medical therapy will be recommended. The sheath was removed and a TR band was placed on the right wrist which is patent hemostasis. She will need Lovenox bridging back to Coumadin anticoagulation. She will be discharged home today as an outpatient and I will see her back in 2 weeks.  Nanetta Batty.  MD, [facility] 09/03/2015 9:46 AM  Findings Coronary Findings Diagnostic  Dominance: Right  No diagnostic findings have been documented. Intervention  No interventions have been documented.   STRESS TESTS  MYOCARDIAL PERFUSION IMAGING 08/03/2015  Narrative  No T wave inversion was noted during stress.  There was no ST segment deviation noted during stress.  Defect 1: There is a large defect of moderate severity.  Findings consistent with prior myocardial infarction.  Large, fixed anteroseptal, apical and inferolateral perfusion defects suggestive of scar. No reversible ischemia.  Dilated LV. Non-gated due to frequent ectopy. This is an intermediate risk study.   ECHOCARDIOGRAM  ECHOCARDIOGRAM COMPLETE 08/28/2023  Narrative ECHOCARDIOGRAM REPORT    Patient Name:   Nicole Gibbs Date of Exam: 08/28/2023 Medical Rec #:  [id number]   Height:       60.0 in Accession #:    [id number]  Weight:       220.0 lb Date of Birth:  1946/03/03    BSA:          1.944 m Patient Age:    77 years    BP:           121/77 mmHg Patient Gender: F           HR:           76 bpm. Exam Location:  Inpatient  Procedure: 2D Echo, Cardiac Doppler, Color Doppler and Intracardiac Opacification Agent (Both Spectral and Color Flow Doppler were utilized during procedure).  Indications:    Afib I48.91  History:        Patient has prior history of Echocardiogram examinations, most recent 11/25/2020.  Sonographer:    [name] RDCS Referring Phys: [id number] SAGAR H JINWALA  IMPRESSIONS   1. Left ventricular ejection fraction, by estimation, is 30 to 35%. The left ventricle has moderately decreased function. The left ventricle demonstrates global hypokinesis. The left ventricular internal cavity size was mildly dilated. There is mild concentric left ventricular hypertrophy. Left ventricular diastolic parameters are consistent with Grade II diastolic dysfunction (pseudonormalization). 2. Right ventricular systolic function is moderately reduced. The right ventricular size is normal. 3. Left atrial size was moderately dilated. 4. The mitral valve is degenerative. Mild mitral valve regurgitation. No evidence of mitral stenosis. 5. The aortic valve is tricuspid. There is mild calcification of the aortic valve. Aortic valve regurgitation is mild. No aortic stenosis is present. 6. The inferior vena cava is dilated in size with <50% respiratory variability, suggesting right atrial pressure of 15 mmHg.  FINDINGS Left Ventricle: Left ventricular ejection fraction, by estimation, is 30 to 35%. The left  ventricle has moderately decreased function. The left ventricle demonstrates global hypokinesis. The left ventricular internal cavity size was mildly dilated. There is mild concentric left ventricular hypertrophy. Left ventricular diastolic parameters are consistent with Grade II diastolic dysfunction (pseudonormalization).  Right Ventricle: The right ventricular size is normal. No increase in right ventricular wall thickness. Right ventricular systolic function is moderately reduced.  Left Atrium: Left atrial size was moderately dilated.  Right Atrium: Right atrial size was normal in size.  Pericardium: There is no evidence of pericardial effusion.  Mitral Valve: The mitral valve is degenerative in appearance. Mild to moderate mitral annular calcification. Mild mitral valve regurgitation. No evidence of mitral valve stenosis.  Tricuspid Valve: The tricuspid valve is normal in structure. Tricuspid valve regurgitation is trivial. No evidence of tricuspid stenosis.  Aortic Valve: The aortic valve is tricuspid. There is mild calcification of the aortic valve.  Aortic valve regurgitation is mild. No aortic stenosis is present.  Pulmonic Valve: The pulmonic valve was normal in structure. Pulmonic valve regurgitation is trivial. No evidence of pulmonic stenosis.  Aorta: The aortic root is normal in size and structure.  Venous: The inferior vena cava is dilated in size with less than 50% respiratory variability, suggesting right atrial pressure of 15 mmHg.  IAS/Shunts: No atrial level shunt detected by color flow Doppler.   LEFT VENTRICLE PLAX 2D LVIDd:         5.80 cm      Diastology LVIDs:         5.20 cm      LV e' medial:    5.55 cm/s LV PW:         1.30 cm      LV E/e' medial:  24.9 LV IVS:        1.20 cm      LV e' lateral:   5.33 cm/s LVOT diam:     1.80 cm      LV E/e' lateral: 25.9 LVOT Area:     2.54 cm  LV Volumes (MOD) LV vol d, MOD A2C: 170.0 ml LV vol d, MOD A4C: 167.0  ml LV vol s, MOD A2C: 104.0 ml LV vol s, MOD A4C: 110.0 ml LV SV MOD A2C:     66.0 ml LV SV MOD A4C:     167.0 ml LV SV MOD BP:      58.0 ml  RIGHT VENTRICLE         IVC TAPSE (M-mode): 1.8 cm  IVC diam: 2.80 cm  LEFT ATRIUM             Index        RIGHT ATRIUM           Index LA diam:        4.00 cm 2.06 cm/m   RA Area:     16.30 cm LA Vol (A2C):   92.3 ml 47.49 ml/m  RA Volume:   39.60 ml  20.37 ml/m LA Vol (A4C):   58.7 ml 30.20 ml/m LA Biplane Vol: 75.9 ml 39.05 ml/m  AORTA Ao Root diam: 2.60 cm Ao Asc diam:  3.40 cm  MITRAL VALVE MV Area (PHT): 3.01 cm     SHUNTS MV Decel Time: 252 msec     Systemic Diam: 1.80 cm MV E velocity: 138.00 cm/s MV A velocity: 121.00 cm/s MV E/A ratio:  1.14  Jules Oar MD Electronically signed by Jules Oar MD Signature Date/Time: 08/28/2023/3:36:50 PM    Final          ______________________________________________________________________________________________       Assessment & Plan   Atrial Fibrillation with Rapid Ventricular Response With NSVT and PVCs With SVT - s/p ED DCCV -  K goal of 4, MG goal of 2 - increased metoprolol dose today with hold parameters SBP < 100; reviewed with Austine Lefort RN - EKG ordered - if worsening NSVT and Qtc WNL will consider PO amiodarone (polymorphic NSVT without pause dependence)   Acute on Chronic Heart Failure with Combined Ejection Fraction NYHA III presently, Stage C - Continue IV Lasix BID - holding GDMT for now; metoprolol   AKI - has improved with diuresis   Anticoagulation Management - Pharmacy was asked to review options 08/28/23 for DOAC; appreciate recs   Obstructive Sleep Apnea - CPAP is recommended  Morbid obesity - thyroid disease without MEN2a or medullary thyroid disorder - outpatient consideration for  GLP1-RA may be reasonable  Otherwise, per primary team: - Elevated lactic acid  - Hyperthyroidism - History of PE/ DVT  For questions or  updates, please contact [facility] Please consult [email or web address] for contact info under Cardiology/STEMI.      Gloriann Larger, MD FASE [facility] Cardiologist [facility]  [address] [place], [address] [place], [place] [address] [phone]  9:13 AM

## 2023-08-29 NOTE — Progress Notes (Signed)
 PHARMACY - ANTICOAGULATION CONSULT NOTE  Pharmacy Consult for warfarin Indication: pulmonary embolus and afib   Allergies  Allergen Reactions   Keflex [Cephalexin] Diarrhea   Sulfa Antibiotics Hives    Patient Measurements: Height: 5' (152.4 cm) Weight: 99.7 kg (219 lb 12.8 oz) IBW/kg (Calculated) : 45.5 HEPARIN DW (KG): 69.7  Vital Signs: Temp: 97.9 F (36.6 C) (04/15 1138) Temp Source: Oral (04/15 1138) BP: 110/92 (04/15 1138) Pulse Rate: 72 (04/15 1138)  Labs: Recent Labs    08/28/23 0850 08/28/23 1105 08/28/23 2132 08/29/23 0218  HGB 15.3*  --   --  14.2  HCT 48.8*  --   --  43.8  PLT 332  --   --  285  LABPROT 34.4*  --   --  33.0*  INR 3.4*  --   --  3.2*  CREATININE 1.81*  --  1.68* 1.48*  TROPONINIHS 53* 45*  --   --     Estimated Creatinine Clearance: 34.8 mL/min (A) (by C-G formula based on SCr of 1.48 mg/dL (H)).   Medical History: Past Medical History:  Diagnosis Date   Anxiety    Arthritis    knees   Chronic combined systolic and diastolic CHF (congestive heart failure) (HCC)    a. 07/2012 Echo: EF 55-60%, Gr1 DD;  b. 06/2015 Echo: EF 35-40%, triv AI, Mod MR, mod dil LA, mildly reduced RV.   Depression    DVT, bilateral lower limbs (HCC)    H/O cardiovascular stress test    a. 07/2012 MV: fixed mid-dist anterior and apical defect - scar vs attenuation, distal ant HK, no reversibility, EF 51%-->low risk.   Hx of blood clots    Hypertensive heart disease    Hypothyroidism    Joint pain    Nocturia    Normal coronary arteries    by cardiac catheterization 09/03/15   Obese    OSA on CPAP    Postmenopausal vaginal bleeding    Pulmonary embolism, bilateral (HCC)    a. chronic coumadin.   Rash    under breasts   Skin cancer    Sleep apnea    SOB (shortness of breath)    Swelling of both lower extremities    Thyroid nodule    no meds    Assessment: Patient admitted with CC of SOB and generalized weakness. Hx of PE/DVT on warfarin PTA,  regimen PTA 3mg  warfarin daily. Last dose 4/12. I INR today 3.2 down from 3.4 yesterday. HgB 14.2 and PLTs 285 within normal limits/stable.  No bleeding reported.   Goal of Therapy:  INR 2-3 Monitor platelets by anticoagulation protocol: Yes   Plan:  Warfarin 1mg  today  (reduced from PTA dose) Daily INR   08/29/2023,2:28 PM

## 2023-08-29 NOTE — Progress Notes (Signed)
 PROGRESS NOTE    Nicole Gibbs  XBJ:[id number] DOB: 04/25/1946 DOA: 08/28/2023 PCP: Fatima Sanger, FNP   Brief Narrative:  Nicole Gibbs is a 78 y.o. female with medical history significant of chronic combined systolic and diastolic HF, hx of DVT/PE on coumadin, hypertension, hyperthyroidism, obesity, OSA on CPAP presenting to the ED with shortness of breath with exertion. Patient reports that she has noted shortness of breath with exertion over the past week but it has worsened since this past Saturday.  Patient required cardioversion in the ED, hospitalist called for admission.  Cardiology called in consult.  Assessment and plan Principal Problem:   AKI (acute kidney injury) (HCC)   Acute symptomatic Atrial Fibrillation with RVR, NSVT/SVT - Rate controlled via cardioversion in the ED - Cardiology consulted - Currently rate controlled somewhat improved over the past 24 hours - Continue to increase metoprolol per cardiology - Continue telemetry   AKI, pre-renal, improving Hypotension -Patient with baseline creatinine around 0.8.  On admission, creatinine 1.81.  -Secondary to poor p.o. intake, confirmed by patient   Essential hypertension, currently hypotensive -See above, continue metoprolol only at this time per cardiology   Elevated troponin in the setting of hypotension, AKI and tachycardia. ACS ruled out Cardiology following, no indication for further evaluation or workup at this time   Hx of DVT/PE -resume coumadin per pharmacy - Continue metoprolol   Chronic combined systolic and diastolic HF -Prior EF 40-45%(improved from 20-25% previously) - Cardiology following, no indication for repeat imaging at this time - Does not appear to be in acute exacerbation or decompensation - Volume down if anything, previously received IV fluids - Continue to hold core measures in setting of hypotension, metoprolol ongoing  Hyperthyroidism - Unlikely involved in patient's A-fib  with RVR given normal TSH and minimally elevated T4 - Recommend ongoing outpatient follow-up - Continue methimazole  OSA  -resume CPAP at bedtime  DVT prophylaxis: Coumadin Code Status:   Code Status: Full Code Family Communication: None present  Status is: Inpatient  Dispo: The patient is from: Home              Anticipated d/c is to: Home              Anticipated d/c date is: 24 to 48 hours              Patient currently not medically stable for discharge  Consultants:  Cardiology  Procedures:  None  Antimicrobials:  None  Subjective: No acute issues or events overnight denies nausea vomiting diarrhea constipation headache fevers chills chest pain palpitations or dyspnea  Objective: Vitals:   08/28/23 2346 08/29/23 0144 08/29/23 0604 08/29/23 0727  BP: 128/79   (!) 107/57  Pulse: 79 78 78 76  Resp: 20 (!) 27 (!) 30 (!) 23  Temp: 98.1 F (36.7 C)   98.5 F (36.9 C)  TempSrc: Oral   Axillary  SpO2: 96% 91% 100% 93%  Weight:   99.7 kg   Height:        Intake/Output Summary (Last 24 hours) at 08/29/2023 0734 Last data filed at 08/29/2023 0732 Gross per 24 hour  Intake 360 ml  Output 1100 ml  Net -740 ml   Filed Weights   08/28/23 0849 08/29/23 0604  Weight: 99.8 kg 99.7 kg    Examination:  General:  Pleasantly resting in bed, No acute distress. HEENT:  Normocephalic atraumatic.  Sclerae nonicteric, noninjected.  Extraocular movements intact bilaterally. Neck:  Without mass or deformity.  Trachea is midline. Lungs:  Clear to auscultate bilaterally without rhonchi, wheeze, or rales. Heart: Irregular irregular rate and rhythm.  Without murmurs, rubs, or gallops. Abdomen:  Soft, obese nontender, nondistended.  Without guarding or rebound. Extremities: Without cyanosis, clubbing, edema Skin:  Warm and dry, no erythema.   Data Reviewed: I have personally reviewed following labs and imaging studies  CBC: Recent Labs  Lab 08/28/23 0850 08/29/23 0218   WBC 11.5* 10.8*  NEUTROABS 8.5*  --   HGB 15.3* 14.2  HCT 48.8* 43.8  MCV 93.1 90.1  PLT 332 285   Basic Metabolic Panel: Recent Labs  Lab 08/28/23 0850 08/28/23 2132 08/29/23 0218  NA 137 140 140  K 4.5 3.7 3.9  CL 100 109 109  CO2 17* 20* 19*  GLUCOSE 136* 139* 106*  BUN 42* 42* 43*  CREATININE 1.81* 1.68* 1.48*  CALCIUM 8.7* 8.1* 8.0*  MG 2.6* 2.3  --    GFR: Estimated Creatinine Clearance: 34.8 mL/min (A) (by C-G formula based on SCr of 1.48 mg/dL (H)).  Coagulation Profile: Recent Labs  Lab 08/28/23 0850 08/29/23 0218  INR 3.4* 3.2*   Thyroid Function Tests: Recent Labs    08/28/23 1247  TSH 0.730  FREET4 1.51*   Sepsis Labs: Recent Labs  Lab 08/28/23 1230 08/28/23 1656  LATICACIDVEN 2.6* 2.0*    No results found for this or any previous visit (from the past 240 hours).       Radiology Studies: ECHOCARDIOGRAM COMPLETE Result Date: 08/28/2023    ECHOCARDIOGRAM REPORT   Patient Name:   Nicole Gibbs Date of Exam: 08/28/2023 Medical Rec #:  [id number]   Height:       60.0 in Accession #:    [id number]  Weight:       220.0 lb Date of Birth:  Dec 19, 1945    BSA:          1.944 m Patient Age:    77 years    BP:           121/77 mmHg Patient Gender: F           HR:           76 bpm. Exam Location:  Inpatient Procedure: 2D Echo, Cardiac Doppler, Color Doppler and Intracardiac            Opacification Agent (Both Spectral and Color Flow Doppler were            utilized during procedure). Indications:    Afib I48.91  History:        Patient has prior history of Echocardiogram examinations, most                 recent 11/25/2020.  Sonographer:    [name] RDCS Referring Phys: [id number] SAGAR H JINWALA IMPRESSIONS  1. Left ventricular ejection fraction, by estimation, is 30 to 35%. The left ventricle has moderately decreased function. The left ventricle demonstrates global hypokinesis. The left ventricular internal cavity size was mildly dilated. There is mild  concentric left ventricular hypertrophy. Left ventricular diastolic parameters are consistent with Grade II diastolic dysfunction (pseudonormalization).  2. Right ventricular systolic function is moderately reduced. The right ventricular size is normal.  3. Left atrial size was moderately dilated.  4. The mitral valve is degenerative. Mild mitral valve regurgitation. No evidence of mitral stenosis.  5. The aortic valve is tricuspid. There is mild calcification of the aortic valve. Aortic valve regurgitation is mild. No aortic stenosis is present.  6.  The inferior vena cava is dilated in size with <50% respiratory variability, suggesting right atrial pressure of 15 mmHg. FINDINGS  Left Ventricle: Left ventricular ejection fraction, by estimation, is 30 to 35%. The left ventricle has moderately decreased function. The left ventricle demonstrates global hypokinesis. The left ventricular internal cavity size was mildly dilated. There is mild concentric left ventricular hypertrophy. Left ventricular diastolic parameters are consistent with Grade II diastolic dysfunction (pseudonormalization). Right Ventricle: The right ventricular size is normal. No increase in right ventricular wall thickness. Right ventricular systolic function is moderately reduced. Left Atrium: Left atrial size was moderately dilated. Right Atrium: Right atrial size was normal in size. Pericardium: There is no evidence of pericardial effusion. Mitral Valve: The mitral valve is degenerative in appearance. Mild to moderate mitral annular calcification. Mild mitral valve regurgitation. No evidence of mitral valve stenosis. Tricuspid Valve: The tricuspid valve is normal in structure. Tricuspid valve regurgitation is trivial. No evidence of tricuspid stenosis. Aortic Valve: The aortic valve is tricuspid. There is mild calcification of the aortic valve. Aortic valve regurgitation is mild. No aortic stenosis is present. Pulmonic Valve: The pulmonic valve  was normal in structure. Pulmonic valve regurgitation is trivial. No evidence of pulmonic stenosis. Aorta: The aortic root is normal in size and structure. Venous: The inferior vena cava is dilated in size with less than 50% respiratory variability, suggesting right atrial pressure of 15 mmHg. IAS/Shunts: No atrial level shunt detected by color flow Doppler.  LEFT VENTRICLE PLAX 2D LVIDd:         5.80 cm      Diastology LVIDs:         5.20 cm      LV e' medial:    5.55 cm/s LV PW:         1.30 cm      LV E/e' medial:  24.9 LV IVS:        1.20 cm      LV e' lateral:   5.33 cm/s LVOT diam:     1.80 cm      LV E/e' lateral: 25.9 LVOT Area:     2.54 cm  LV Volumes (MOD) LV vol d, MOD A2C: 170.0 ml LV vol d, MOD A4C: 167.0 ml LV vol s, MOD A2C: 104.0 ml LV vol s, MOD A4C: 110.0 ml LV SV MOD A2C:     66.0 ml LV SV MOD A4C:     167.0 ml LV SV MOD BP:      58.0 ml RIGHT VENTRICLE         IVC TAPSE (M-mode): 1.8 cm  IVC diam: 2.80 cm LEFT ATRIUM             Index        RIGHT ATRIUM           Index LA diam:        4.00 cm 2.06 cm/m   RA Area:     16.30 cm LA Vol (A2C):   92.3 ml 47.49 ml/m  RA Volume:   39.60 ml  20.37 ml/m LA Vol (A4C):   58.7 ml 30.20 ml/m LA Biplane Vol: 75.9 ml 39.05 ml/m   AORTA Ao Root diam: 2.60 cm Ao Asc diam:  3.40 cm MITRAL VALVE MV Area (PHT): 3.01 cm     SHUNTS MV Decel Time: 252 msec     Systemic Diam: 1.80 cm MV E velocity: 138.00 cm/s MV A velocity: 121.00 cm/s MV E/A ratio:  1.14 Daniel Bensimhon  MD Electronically signed by Jules Oar MD Signature Date/Time: 08/28/2023/3:36:50 PM    Final    US  RENAL Result Date: 08/28/2023 CLINICAL DATA:  [id number] AKI (acute kidney injury) (HCC) [id number]. EXAM: RENAL / URINARY TRACT ULTRASOUND COMPLETE COMPARISON:  None Available. FINDINGS: Right Kidney: Renal measurements: 5.3 x 5.5 x 10.0 cm. = volume: 152 mL. Echogenicity within normal limits. No mass or hydronephrosis visualized. Left Kidney: Renal measurements: 4.5 x 5.1 x 9.9 cm. = volume:  118.7 mL. Echogenicity within normal limits. No mass or hydronephrosis visualized. Bladder: Appears normal for degree of bladder distention. Other: None. IMPRESSION: *Unremarkable renal ultrasound exam. Electronically Signed   By: Beula Brunswick M.D.   On: 08/28/2023 13:15   DG Chest Portable 1 View Result Date: 08/28/2023 CLINICAL DATA:  Shortness of breath. EXAM: PORTABLE CHEST 1 VIEW COMPARISON:  11/29/2020. FINDINGS: Low lung volume. There is mild diffuse pulmonary vascular congestion, likely accentuated by low lung volume. No frank pulmonary edema. Bilateral lung fields are clear. Bilateral costophrenic angles are clear. Stable cardio-mediastinal silhouette. No acute osseous abnormalities. The soft tissues are within normal limits. IMPRESSION: Mild diffuse pulmonary vascular congestion, likely accentuated by low lung volume. No frank pulmonary edema. Electronically Signed   By: Beula Brunswick M.D.   On: 08/28/2023 09:41        Scheduled Meds:  furosemide  40 mg Intravenous BID   methIMAzole  5 mg Oral Daily   metoprolol tartrate  12.5 mg Oral BID   Warfarin - Pharmacist Dosing Inpatient   Does not apply q1600   Continuous Infusions:   LOS: 0 days   Time spent:  Haydee Lipa, DO [facility]  If 7PM-7AM, please contact night-coverage [email or web address]  08/29/2023, 7:34 AM

## 2023-08-29 NOTE — Progress Notes (Signed)
 Pt refused the use of CPAP for the evening.   08/29/23 0144  BiPAP/CPAP/SIPAP  Reason BIPAP/CPAP not in use Non-compliant  BiPAP/CPAP /SiPAP Vitals  Pulse Rate 78  Resp (!) 27  SpO2 91 %  MEWS Score/Color  MEWS Score 2  MEWS Score Color Yellow

## 2023-08-29 NOTE — Progress Notes (Signed)
 Patient with frequent PACs, PVCs throughout shift. At 0028, she had 13 beat run of v-tach, sleeping during and asymptomatic. Alerted Dr. [facility] Lavender who asked me to page cardiology. Paged Dr. Hayes Lipps, cardiology, - no return call or no new orders.  After noticing OSA with CPAP use at home per note, obtained CPAP order. When RT came to room with CPAP, patient refused. Patient does tell me she hasn't worn a CPAP at home for about two years.

## 2023-08-29 NOTE — Plan of Care (Signed)
  Problem: Clinical Measurements: Goal: Will remain free from infection Outcome: Progressing Goal: Respiratory complications will improve Outcome: Progressing   Problem: Coping: Goal: Level of anxiety will decrease Outcome: Progressing   

## 2023-08-30 DIAGNOSIS — N179 Acute kidney failure, unspecified: Secondary | ICD-10-CM | POA: Diagnosis not present

## 2023-08-30 LAB — BASIC METABOLIC PANEL WITH GFR
Anion gap: 11 (ref 5–15)
BUN: 34 mg/dL — ABNORMAL HIGH (ref 8–23)
CO2: 24 mmol/L (ref 22–32)
Calcium: 7.9 mg/dL — ABNORMAL LOW (ref 8.9–10.3)
Chloride: 104 mmol/L (ref 98–111)
Creatinine, Ser: 1.05 mg/dL — ABNORMAL HIGH (ref 0.44–1.00)
GFR, Estimated: 55 mL/min — ABNORMAL LOW (ref >60)
Glucose, Bld: 93 mg/dL (ref 70–99)
Potassium: 3.8 mmol/L (ref 3.5–5.1)
Sodium: 139 mmol/L (ref 135–145)

## 2023-08-30 LAB — PROTIME-INR
INR: 2 — ABNORMAL HIGH (ref 0.8–1.2)
Prothrombin Time: 23.1 s — ABNORMAL HIGH (ref 11.4–15.2)

## 2023-08-30 LAB — MAGNESIUM: Magnesium: 2 mg/dL (ref 1.7–2.4)

## 2023-08-30 MED ORDER — WARFARIN SODIUM 3 MG PO TABS
3.0000 mg | ORAL_TABLET | Freq: Once | ORAL | Status: AC
  Administered 2023-08-30: 3 mg via ORAL
  Filled 2023-08-30: qty 1

## 2023-08-30 MED ORDER — GUAIFENESIN-DM 100-10 MG/5ML PO SYRP
5.0000 mL | ORAL_SOLUTION | ORAL | Status: DC | PRN
  Administered 2023-08-30: 5 mL via ORAL
  Filled 2023-08-30: qty 5

## 2023-08-30 MED ORDER — SPIRONOLACTONE 25 MG PO TABS
25.0000 mg | ORAL_TABLET | Freq: Every day | ORAL | Status: DC
  Administered 2023-08-30 – 2023-08-31 (×2): 25 mg via ORAL
  Filled 2023-08-30 (×2): qty 1

## 2023-08-30 NOTE — Progress Notes (Signed)
 PROGRESS NOTE    Nicole Gibbs  QMV:[id number] DOB: 1945/08/26 DOA: 08/28/2023 PCP: Fatima Sanger, FNP   Brief Narrative:  Nicole Gibbs is a 78 y.o. female with medical history significant of chronic combined systolic and diastolic HF, hx of DVT/PE on coumadin, hypertension, hyperthyroidism, obesity, OSA on CPAP presenting to the ED with shortness of breath with exertion. Patient reports that she has noted shortness of breath with exertion over the past week but it has worsened since this past Saturday.  Patient required cardioversion in the ED, hospitalist called for admission.  Cardiology called in consult.  Assessment and plan Principal Problem:   AKI (acute kidney injury) (HCC) Active Problems:   Atrial fibrillation with RVR (HCC)  Acute symptomatic Atrial Fibrillation with RVR, NSVT/SVT - Rate controlled via cardioversion in the ED - Cardiology consulted, appreciate insight recommendations - Currently rate controlled on metoprolol - Continue telemetry   AKI, pre-renal, improving Hypotension -Patient with baseline creatinine around 0.8.  On admission, creatinine 1.81.  -Secondary to poor p.o. intake, confirmed by patient   Essential hypertension, currently hypotensive -See above, continue metoprolol only at this time per cardiology   Elevated troponin in the setting of hypotension, AKI and tachycardia. ACS ruled out Cardiology following, no indication for further evaluation or workup at this time   Hx of DVT/PE -resume coumadin per pharmacy - Continue metoprolol   Chronic combined systolic and diastolic HF -Prior EF 40-45%(improved from 20-25% previously) - Cardiology following, no indication for repeat imaging at this time - Does not appear to be in acute exacerbation or decompensation - Volume down if anything, previously received IV fluids - Continue to hold core measures in setting of hypotension, metoprolol ongoing  Hyperthyroidism - Unlikely involved in  patient's A-fib with RVR given normal TSH and minimally elevated T4 - Recommend ongoing outpatient follow-up - Continue methimazole  OSA  -resume CPAP at bedtime  DVT prophylaxis: Coumadin Code Status:   Code Status: Full Code Family Communication: None present  Status is: Inpatient  Dispo: The patient is from: Home              Anticipated d/c is to: Home              Anticipated d/c date is: 24 to 48 hours              Patient currently not medically stable for discharge  Consultants:  Cardiology  Procedures:  None  Antimicrobials:  None  Subjective: No acute issues or events overnight denies nausea vomiting diarrhea constipation headache fevers chills chest pain palpitations or dyspnea  Objective: Vitals:   08/29/23 1945 08/29/23 2139 08/29/23 2300 08/30/23 0507  BP: 105/67 105/82 112/77 106/67  Pulse: 80 80 66   Resp: 20  18 19   Temp: 98.8 F (37.1 C)  98.1 F (36.7 C) 98.2 F (36.8 C)  TempSrc: Oral  Oral Oral  SpO2: 93%  94% 96%  Weight:    100.2 kg  Height:        Intake/Output Summary (Last 24 hours) at 08/30/2023 0713 Last data filed at 08/30/2023 0500 Gross per 24 hour  Intake 1080 ml  Output 3250 ml  Net -2170 ml   Filed Weights   08/28/23 0849 08/29/23 0604 08/30/23 0507  Weight: 99.8 kg 99.7 kg 100.2 kg    Examination:  General:  Pleasantly resting in bed, No acute distress. HEENT:  Normocephalic atraumatic.  Sclerae nonicteric, noninjected.  Extraocular movements intact bilaterally. Neck:  Without mass or  deformity.  Trachea is midline. Lungs:  Clear to auscultate bilaterally without rhonchi, wheeze, or rales. Heart: Irregular irregular rate and rhythm.  Without murmurs, rubs, or gallops. Abdomen:  Soft, obese nontender, nondistended.  Without guarding or rebound. Extremities: Without cyanosis, clubbing, edema Skin:  Warm and dry, no erythema.   Data Reviewed: I have personally reviewed following labs and imaging  studies  CBC: Recent Labs  Lab 08/28/23 0850 08/29/23 0218  WBC 11.5* 10.8*  NEUTROABS 8.5*  --   HGB 15.3* 14.2  HCT 48.8* 43.8  MCV 93.1 90.1  PLT 332 285   Basic Metabolic Panel: Recent Labs  Lab 08/28/23 0850 08/28/23 2132 08/29/23 0218 08/30/23 0234  NA 137 140 140 139  K 4.5 3.7 3.9 3.8  CL 100 109 109 104  CO2 17* 20* 19* 24  GLUCOSE 136* 139* 106* 93  BUN 42* 42* 43* 34*  CREATININE 1.81* 1.68* 1.48* 1.05*  CALCIUM 8.7* 8.1* 8.0* 7.9*  MG 2.6* 2.3  --  2.0   GFR: Estimated Creatinine Clearance: 49.3 mL/min (A) (by C-G formula based on SCr of 1.05 mg/dL (H)).  Coagulation Profile: Recent Labs  Lab 08/28/23 0850 08/29/23 0218 08/30/23 0234  INR 3.4* 3.2* 2.0*   Thyroid Function Tests: Recent Labs    08/28/23 1247  TSH 0.730  FREET4 1.51*   Sepsis Labs: Recent Labs  Lab 08/28/23 1230 08/28/23 1656  LATICACIDVEN 2.6* 2.0*    No results found for this or any previous visit (from the past 240 hours).       Radiology Studies: ECHOCARDIOGRAM COMPLETE Result Date: 08/28/2023    ECHOCARDIOGRAM REPORT   Patient Name:   Nicole Gibbs Date of Exam: 08/28/2023 Medical Rec #:  [id number]   Height:       60.0 in Accession #:    [id number]  Weight:       220.0 lb Date of Birth:  07-Mar-1946    BSA:          1.944 m Patient Age:    77 years    BP:           121/77 mmHg Patient Gender: F           HR:           76 bpm. Exam Location:  Inpatient Procedure: 2D Echo, Cardiac Doppler, Color Doppler and Intracardiac            Opacification Agent (Both Spectral and Color Flow Doppler were            utilized during procedure). Indications:    Afib I48.91  History:        Patient has prior history of Echocardiogram examinations, most                 recent 11/25/2020.  Sonographer:    Harriette Bouillon RDCS Referring Phys: [id number] SAGAR H JINWALA IMPRESSIONS  1. Left ventricular ejection fraction, by estimation, is 30 to 35%. The left ventricle has moderately decreased function.  The left ventricle demonstrates global hypokinesis. The left ventricular internal cavity size was mildly dilated. There is mild concentric left ventricular hypertrophy. Left ventricular diastolic parameters are consistent with Grade II diastolic dysfunction (pseudonormalization).  2. Right ventricular systolic function is moderately reduced. The right ventricular size is normal.  3. Left atrial size was moderately dilated.  4. The mitral valve is degenerative. Mild mitral valve regurgitation. No evidence of mitral stenosis.  5. The aortic valve is tricuspid. There is mild calcification  of the aortic valve. Aortic valve regurgitation is mild. No aortic stenosis is present.  6. The inferior vena cava is dilated in size with <50% respiratory variability, suggesting right atrial pressure of 15 mmHg. FINDINGS  Left Ventricle: Left ventricular ejection fraction, by estimation, is 30 to 35%. The left ventricle has moderately decreased function. The left ventricle demonstrates global hypokinesis. The left ventricular internal cavity size was mildly dilated. There is mild concentric left ventricular hypertrophy. Left ventricular diastolic parameters are consistent with Grade II diastolic dysfunction (pseudonormalization). Right Ventricle: The right ventricular size is normal. No increase in right ventricular wall thickness. Right ventricular systolic function is moderately reduced. Left Atrium: Left atrial size was moderately dilated. Right Atrium: Right atrial size was normal in size. Pericardium: There is no evidence of pericardial effusion. Mitral Valve: The mitral valve is degenerative in appearance. Mild to moderate mitral annular calcification. Mild mitral valve regurgitation. No evidence of mitral valve stenosis. Tricuspid Valve: The tricuspid valve is normal in structure. Tricuspid valve regurgitation is trivial. No evidence of tricuspid stenosis. Aortic Valve: The aortic valve is tricuspid. There is mild  calcification of the aortic valve. Aortic valve regurgitation is mild. No aortic stenosis is present. Pulmonic Valve: The pulmonic valve was normal in structure. Pulmonic valve regurgitation is trivial. No evidence of pulmonic stenosis. Aorta: The aortic root is normal in size and structure. Venous: The inferior vena cava is dilated in size with less than 50% respiratory variability, suggesting right atrial pressure of 15 mmHg. IAS/Shunts: No atrial level shunt detected by color flow Doppler.  LEFT VENTRICLE PLAX 2D LVIDd:         5.80 cm      Diastology LVIDs:         5.20 cm      LV e' medial:    5.55 cm/s LV PW:         1.30 cm      LV E/e' medial:  24.9 LV IVS:        1.20 cm      LV e' lateral:   5.33 cm/s LVOT diam:     1.80 cm      LV E/e' lateral: 25.9 LVOT Area:     2.54 cm  LV Volumes (MOD) LV vol d, MOD A2C: 170.0 ml LV vol d, MOD A4C: 167.0 ml LV vol s, MOD A2C: 104.0 ml LV vol s, MOD A4C: 110.0 ml LV SV MOD A2C:     66.0 ml LV SV MOD A4C:     167.0 ml LV SV MOD BP:      58.0 ml RIGHT VENTRICLE         IVC TAPSE (M-mode): 1.8 cm  IVC diam: 2.80 cm LEFT ATRIUM             Index        RIGHT ATRIUM           Index LA diam:        4.00 cm 2.06 cm/m   RA Area:     16.30 cm LA Vol (A2C):   92.3 ml 47.49 ml/m  RA Volume:   39.60 ml  20.37 ml/m LA Vol (A4C):   58.7 ml 30.20 ml/m LA Biplane Vol: 75.9 ml 39.05 ml/m   AORTA Ao Root diam: 2.60 cm Ao Asc diam:  3.40 cm MITRAL VALVE MV Area (PHT): 3.01 cm     SHUNTS MV Decel Time: 252 msec     Systemic Diam: 1.80 cm MV  E velocity: 138.00 cm/s MV A velocity: 121.00 cm/s MV E/A ratio:  1.14 Jules Oar MD Electronically signed by Jules Oar MD Signature Date/Time: 08/28/2023/3:36:50 PM    Final    US  RENAL Result Date: 08/28/2023 CLINICAL DATA:  [id number] AKI (acute kidney injury) (HCC) [id number]. EXAM: RENAL / URINARY TRACT ULTRASOUND COMPLETE COMPARISON:  None Available. FINDINGS: Right Kidney: Renal measurements: 5.3 x 5.5 x 10.0 cm. = volume: 152 mL.  Echogenicity within normal limits. No mass or hydronephrosis visualized. Left Kidney: Renal measurements: 4.5 x 5.1 x 9.9 cm. = volume: 118.7 mL. Echogenicity within normal limits. No mass or hydronephrosis visualized. Bladder: Appears normal for degree of bladder distention. Other: None. IMPRESSION: *Unremarkable renal ultrasound exam. Electronically Signed   By: Beula Brunswick M.D.   On: 08/28/2023 13:15   DG Chest Portable 1 View Result Date: 08/28/2023 CLINICAL DATA:  Shortness of breath. EXAM: PORTABLE CHEST 1 VIEW COMPARISON:  11/29/2020. FINDINGS: Low lung volume. There is mild diffuse pulmonary vascular congestion, likely accentuated by low lung volume. No frank pulmonary edema. Bilateral lung fields are clear. Bilateral costophrenic angles are clear. Stable cardio-mediastinal silhouette. No acute osseous abnormalities. The soft tissues are within normal limits. IMPRESSION: Mild diffuse pulmonary vascular congestion, likely accentuated by low lung volume. No frank pulmonary edema. Electronically Signed   By: Beula Brunswick M.D.   On: 08/28/2023 09:41        Scheduled Meds:  furosemide  40 mg Intravenous BID   methIMAzole  5 mg Oral Daily   metoprolol tartrate  25 mg Oral BID   potassium chloride  40 mEq Oral Daily   Warfarin - Pharmacist Dosing Inpatient   Does not apply q1600   Continuous Infusions:   LOS: 1 day   Time spent:  Haydee Lipa, DO [facility]  If 7PM-7AM, please contact night-coverage [email or web address]  08/30/2023, 7:13 AM

## 2023-08-30 NOTE — Progress Notes (Signed)
Pt. Refused cpap. 

## 2023-08-30 NOTE — Plan of Care (Signed)

## 2023-08-30 NOTE — Progress Notes (Signed)
 Progress Note  Patient Name: Nicole Gibbs Date of Encounter: 08/30/2023 Primary Cardiologist: Nanetta Batty, MD   Subjective   Improved ectopy overnight Feels better but has not been active. Lost IV access  Vital Signs    Vitals:   08/29/23 2139 08/29/23 2300 08/30/23 0507 08/30/23 0741  BP: 105/82 112/77 106/67 113/62  Pulse: 80 66  84  Resp:  18 19 (!) 22  Temp:  98.1 F (36.7 C) 98.2 F (36.8 C) 97.9 F (36.6 C)  TempSrc:  Oral Oral Oral  SpO2:  94% 96% 92%  Weight:   100.2 kg   Height:        Intake/Output Summary (Last 24 hours) at 08/30/2023 0858 Last data filed at 08/30/2023 0858 Gross per 24 hour  Intake 1080 ml  Output 3350 ml  Net -2270 ml   Filed Weights   08/28/23 0849 08/29/23 0604 08/30/23 0507  Weight: 99.8 kg 99.7 kg 100.2 kg    Physical Exam   GEN: No distress  Neck: no JVD at 90 degrees Cardiac: RRR, no murmurs, rubs, or gallops.  Respiratory: Decreased breath sounds; No tachypnea GI: Soft, nontender, non-distended  MS: Non pitting edema  Labs   EKG: SR PACs with no QT prolongation.  Telemetry: SR with PVCs   Chemistry Recent Labs  Lab 08/28/23 2132 08/29/23 0218 08/30/23 0234  NA 140 140 139  K 3.7 3.9 3.8  CL 109 109 104  CO2 20* 19* 24  GLUCOSE 139* 106* 93  BUN 42* 43* 34*  CREATININE 1.68* 1.48* 1.05*  CALCIUM 8.1* 8.0* 7.9*  GFRNONAA 32* 37* 55*  ANIONGAP 11 12 11      Hematology Recent Labs  Lab 08/28/23 0850 08/29/23 0218  WBC 11.5* 10.8*  RBC 5.24* 4.86  HGB 15.3* 14.2  HCT 48.8* 43.8  MCV 93.1 90.1  MCH 29.2 29.2  MCHC 31.4 32.4  RDW 14.3 14.5  PLT 332 285    BNP Recent Labs  Lab 08/28/23 0850  BNP 700.4*     Cardiac Studies   Cardiac Studies & Procedures   ______________________________________________________________________________________________ CARDIAC CATHETERIZATION  CARDIAC CATHETERIZATION 09/03/2015  Conclusion Images from the original result were not included.  There is  moderate left ventricular systolic dysfunction.  Nicole Gibbs is a 78 y.o. female   [id number] LOCATION:  FACILITY: [facility] PHYSICIAN: Nanetta Batty, M.D. 1946-05-01   DATE OF PROCEDURE:  09/03/2015  DATE OF DISCHARGE:     CARDIAC CATHETERIZATION    History obtained from chart review. [name]  is a 78 year old female with a history of DVT and pulmonary embolism on chronic oral anticoagulation. She has had a decline in her LV function with a stress test that suggested new scar She presents now for diagnostic coronary arteriography to define her anatomy and rule out an ischemic etiology.    IMPRESSION:Ms. Sanford has a nonischemic cardiomyopathy with normal coronary arteries and moderate global LV dysfunction.Continue medical therapy will be recommended. The sheath was removed and a TR band was placed on the right wrist which is patent hemostasis. She will need Lovenox bridging back to Coumadin anticoagulation. She will be discharged home today as an outpatient and I will see her back in 2 weeks.  Nanetta Batty. MD, [facility] 09/03/2015 9:46 AM  Findings Coronary Findings Diagnostic  Dominance: Right  No diagnostic findings have been documented. Intervention  No interventions have been documented.   STRESS TESTS  MYOCARDIAL PERFUSION IMAGING 08/03/2015  Narrative  No T wave inversion was noted during  stress.  There was no ST segment deviation noted during stress.  Defect 1: There is a large defect of moderate severity.  Findings consistent with prior myocardial infarction.  Large, fixed anteroseptal, apical and inferolateral perfusion defects suggestive of scar. No reversible ischemia. Dilated LV. Non-gated due to frequent ectopy. This is an intermediate risk study.   ECHOCARDIOGRAM  ECHOCARDIOGRAM COMPLETE 08/28/2023  Narrative ECHOCARDIOGRAM REPORT    Patient Name:   Nicole Gibbs Date of Exam: 08/28/2023 Medical Rec #:  [id number]   Height:       60.0 in Accession  #:    [id number]  Weight:       220.0 lb Date of Birth:  Nov 09, 1945    BSA:          1.944 m Patient Age:    77 years    BP:           121/77 mmHg Patient Gender: F           HR:           76 bpm. Exam Location:  Inpatient  Procedure: 2D Echo, Cardiac Doppler, Color Doppler and Intracardiac Opacification Agent (Both Spectral and Color Flow Doppler were utilized during procedure).  Indications:    Afib I48.91  History:        Patient has prior history of Echocardiogram examinations, most recent 11/25/2020.  Sonographer:    Harriette Bouillon RDCS Referring Phys: [id number] SAGAR H JINWALA  IMPRESSIONS   1. Left ventricular ejection fraction, by estimation, is 30 to 35%. The left ventricle has moderately decreased function. The left ventricle demonstrates global hypokinesis. The left ventricular internal cavity size was mildly dilated. There is mild concentric left ventricular hypertrophy. Left ventricular diastolic parameters are consistent with Grade II diastolic dysfunction (pseudonormalization). 2. Right ventricular systolic function is moderately reduced. The right ventricular size is normal. 3. Left atrial size was moderately dilated. 4. The mitral valve is degenerative. Mild mitral valve regurgitation. No evidence of mitral stenosis. 5. The aortic valve is tricuspid. There is mild calcification of the aortic valve. Aortic valve regurgitation is mild. No aortic stenosis is present. 6. The inferior vena cava is dilated in size with <50% respiratory variability, suggesting right atrial pressure of 15 mmHg.  FINDINGS Left Ventricle: Left ventricular ejection fraction, by estimation, is 30 to 35%. The left ventricle has moderately decreased function. The left ventricle demonstrates global hypokinesis. The left ventricular internal cavity size was mildly dilated. There is mild concentric left ventricular hypertrophy. Left ventricular diastolic parameters are consistent with Grade II diastolic  dysfunction (pseudonormalization).  Right Ventricle: The right ventricular size is normal. No increase in right ventricular wall thickness. Right ventricular systolic function is moderately reduced.  Left Atrium: Left atrial size was moderately dilated.  Right Atrium: Right atrial size was normal in size.  Pericardium: There is no evidence of pericardial effusion.  Mitral Valve: The mitral valve is degenerative in appearance. Mild to moderate mitral annular calcification. Mild mitral valve regurgitation. No evidence of mitral valve stenosis.  Tricuspid Valve: The tricuspid valve is normal in structure. Tricuspid valve regurgitation is trivial. No evidence of tricuspid stenosis.  Aortic Valve: The aortic valve is tricuspid. There is mild calcification of the aortic valve. Aortic valve regurgitation is mild. No aortic stenosis is present.  Pulmonic Valve: The pulmonic valve was normal in structure. Pulmonic valve regurgitation is trivial. No evidence of pulmonic stenosis.  Aorta: The aortic root is normal in size and structure.  Venous: The inferior vena  cava is dilated in size with less than 50% respiratory variability, suggesting right atrial pressure of 15 mmHg.  IAS/Shunts: No atrial level shunt detected by color flow Doppler.   LEFT VENTRICLE PLAX 2D LVIDd:         5.80 cm      Diastology LVIDs:         5.20 cm      LV e' medial:    5.55 cm/s LV PW:         1.30 cm      LV E/e' medial:  24.9 LV IVS:        1.20 cm      LV e' lateral:   5.33 cm/s LVOT diam:     1.80 cm      LV E/e' lateral: 25.9 LVOT Area:     2.54 cm  LV Volumes (MOD) LV vol d, MOD A2C: 170.0 ml LV vol d, MOD A4C: 167.0 ml LV vol s, MOD A2C: 104.0 ml LV vol s, MOD A4C: 110.0 ml LV SV MOD A2C:     66.0 ml LV SV MOD A4C:     167.0 ml LV SV MOD BP:      58.0 ml  RIGHT VENTRICLE         IVC TAPSE (M-mode): 1.8 cm  IVC diam: 2.80 cm  LEFT ATRIUM             Index        RIGHT ATRIUM           Index LA  diam:        4.00 cm 2.06 cm/m   RA Area:     16.30 cm LA Vol (A2C):   92.3 ml 47.49 ml/m  RA Volume:   39.60 ml  20.37 ml/m LA Vol (A4C):   58.7 ml 30.20 ml/m LA Biplane Vol: 75.9 ml 39.05 ml/m  AORTA Ao Root diam: 2.60 cm Ao Asc diam:  3.40 cm  MITRAL VALVE MV Area (PHT): 3.01 cm     SHUNTS MV Decel Time: 252 msec     Systemic Diam: 1.80 cm MV E velocity: 138.00 cm/s MV A velocity: 121.00 cm/s MV E/A ratio:  1.14  Jules Oar MD Electronically signed by Jules Oar MD Signature Date/Time: 08/28/2023/3:36:50 PM    Final          ______________________________________________________________________________________________       Assessment & Plan   Atrial Fibrillation with Rapid Ventricular Response With NSVT and PVCs With SVT - s/p ED DCCV -  K goal of 4, MG goal of 2 - continue metoprolol   Acute on Chronic Heart Failure with Combined Ejection Fraction NYHA III presently, Stage C - Continue IV Lasix BID - added metoprolol 08/29/23 - adding MRA today to assist with K - had yeast infections on SGLT2i, will not add at this time (outpatient re-consideration is reasonable) - patient has strong concerns about BID dosing of diuretics at home - she is a potential DC tomorrow from a cardiac perspective; will need ambulation today and will get BNP tomorrow for doinsg recommendations  AKI - has improved with diuresis - BNP tomorrow with hopes to transition to PO   Anticoagulation Management - Pharmacy was asked to review options 08/28/23 for DOAC; appreciate recs   Obstructive Sleep Apnea - CPAP is recommended (seh has declined)  Morbid obesity - thyroid disease without MEN2a or medullary thyroid disorder - outpatient consideration for GLP1-RA may be reasonable  Working on arrange safe  outpatient follow up  Otherwise, per primary team: - Elevated lactic acid  - Hyperthyroidism - History of PE/ DVT  For questions or updates, please contact  [facility] Please consult [email or web address] for contact info under Cardiology/STEMI.      Gloriann Larger, MD FASE [facility] Cardiologist [facility]  [address] [place], [address] [place], [place] [address] [phone]  8:58 AM

## 2023-08-30 NOTE — Progress Notes (Signed)
 PHARMACY - ANTICOAGULATION CONSULT NOTE  Pharmacy Consult for warfarin Indication: pulmonary embolus and afib   Allergies  Allergen Reactions   Keflex [Cephalexin] Diarrhea   Sulfa Antibiotics Hives    Patient Measurements: Height: 5' (152.4 cm) Weight: 100.2 kg (220 lb 14.4 oz) IBW/kg (Calculated) : 45.5 HEPARIN DW (KG): 69.7  Vital Signs: Temp: 98.2 F (36.8 C) (04/16 1125) Temp Source: Oral (04/16 1125) BP: 105/62 (04/16 1125) Pulse Rate: 79 (04/16 1125)  Labs: Recent Labs    08/28/23 0850 08/28/23 1105 08/28/23 2132 08/29/23 0218 08/30/23 0234  HGB 15.3*  --   --  14.2  --   HCT 48.8*  --   --  43.8  --   PLT 332  --   --  285  --   LABPROT 34.4*  --   --  33.0* 23.1*  INR 3.4*  --   --  3.2* 2.0*  CREATININE 1.81*  --  1.68* 1.48* 1.05*  TROPONINIHS 53* 45*  --   --   --     Estimated Creatinine Clearance: 49.3 mL/min (A) (by C-G formula based on SCr of 1.05 mg/dL (H)).   Medical History: Past Medical History:  Diagnosis Date   Anxiety    Arthritis    knees   Chronic combined systolic and diastolic CHF (congestive heart failure) (HCC)    a. 07/2012 Echo: EF 55-60%, Gr1 DD;  b. 06/2015 Echo: EF 35-40%, triv AI, Mod MR, mod dil LA, mildly reduced RV.   Depression    DVT, bilateral lower limbs (HCC)    H/O cardiovascular stress test    a. 07/2012 MV: fixed mid-dist anterior and apical defect - scar vs attenuation, distal ant HK, no reversibility, EF 51%-->low risk.   Hx of blood clots    Hypertensive heart disease    Hypothyroidism    Joint pain    Nocturia    Normal coronary arteries    by cardiac catheterization 09/03/15   Obese    OSA on CPAP    Postmenopausal vaginal bleeding    Pulmonary embolism, bilateral (HCC)    a. chronic coumadin.   Rash    under breasts   Skin cancer    Sleep apnea    SOB (shortness of breath)    Swelling of both lower extremities    Thyroid nodule    no meds    Assessment: Patient admitted with CC of SOB and  generalized weakness. Hx of PE/DVT on warfarin PTA, regimen PTA 3mg  warfarin daily. Last dose 4/12. I INR today is 2.0,  down from 3.2 yesterday.   CCBC within normal limits/stable.  No bleeding reported.  Methimazole (Tapazole) resumed 4/14  (noted not taking PTA)- can alter effect of Warfarin.   Goal of Therapy:  INR 2-3 Monitor platelets by anticoagulation protocol: Yes   Plan:  Warfarin 3mg  today Daily INR   08/30/2023,2:27 PM

## 2023-08-30 NOTE — Progress Notes (Signed)
 Mobility Specialist Progress Note;   08/30/23 0930  Mobility  Activity Ambulated with assistance in hallway  Level of Assistance Contact guard assist, steadying assist  Assistive Device Other (Comment) (HHA)  Distance Ambulated (ft) 125 ft  Activity Response Tolerated well  Mobility Referral Yes  Mobility visit 1 Mobility  Mobility Specialist Start Time (ACUTE ONLY) 0930  Mobility Specialist Stop Time (ACUTE ONLY) 0943  Mobility Specialist Time Calculation (min) (ACUTE ONLY) 13 min    Pre-mobility: SPO2 94%; HR 82 bpm During-mobility: SPO2 94%>; HR up to 103 bpm Post-mobility: SPO2 96%; HR 83  Pt eager for mobility. Required MinG assistance via HHA to replicate a cane (pt uses at baseline). HR up to 103 bpm w/ activity. No c/o when asked. Pt returned back to sitting on EOB with all needs met.   Janit Meline Mobility Specialist Please contact via SecureChat or [employer] [phone]

## 2023-08-30 NOTE — Plan of Care (Signed)
  Problem: Clinical Measurements: Goal: Respiratory complications will improve Outcome: Progressing Goal: Cardiovascular complication will be avoided Outcome: Progressing   Problem: Elimination: Goal: Will not experience complications related to urinary retention Outcome: Progressing   Problem: Pain Managment: Goal: General experience of comfort will improve and/or be controlled Outcome: Progressing   Problem: Safety: Goal: Ability to remain free from injury will improve Outcome: Progressing

## 2023-08-31 ENCOUNTER — Other Ambulatory Visit (HOSPITAL_COMMUNITY): Payer: Self-pay

## 2023-08-31 ENCOUNTER — Telehealth (HOSPITAL_COMMUNITY): Payer: Self-pay | Admitting: Pharmacy Technician

## 2023-08-31 DIAGNOSIS — N179 Acute kidney failure, unspecified: Secondary | ICD-10-CM | POA: Diagnosis not present

## 2023-08-31 LAB — BASIC METABOLIC PANEL WITH GFR
Anion gap: 10 (ref 5–15)
BUN: 29 mg/dL — ABNORMAL HIGH (ref 8–23)
CO2: 27 mmol/L (ref 22–32)
Calcium: 8.6 mg/dL — ABNORMAL LOW (ref 8.9–10.3)
Chloride: 102 mmol/L (ref 98–111)
Creatinine, Ser: 0.95 mg/dL (ref 0.44–1.00)
GFR, Estimated: >60 mL/min (ref >60)
Glucose, Bld: 102 mg/dL — ABNORMAL HIGH (ref 70–99)
Potassium: 4.1 mmol/L (ref 3.5–5.1)
Sodium: 139 mmol/L (ref 135–145)

## 2023-08-31 LAB — MAGNESIUM: Magnesium: 1.8 mg/dL (ref 1.7–2.4)

## 2023-08-31 LAB — PROTIME-INR
INR: 1.8 — ABNORMAL HIGH (ref 0.8–1.2)
Prothrombin Time: 21 s — ABNORMAL HIGH (ref 11.4–15.2)

## 2023-08-31 MED ORDER — METOPROLOL TARTRATE 25 MG PO TABS
25.0000 mg | ORAL_TABLET | Freq: Two times a day (BID) | ORAL | 0 refills | Status: DC
  Filled 2023-08-31: qty 60, 30d supply, fill #0

## 2023-08-31 MED ORDER — FUROSEMIDE 40 MG PO TABS
60.0000 mg | ORAL_TABLET | Freq: Every day | ORAL | Status: DC
  Administered 2023-08-31: 60 mg via ORAL
  Filled 2023-08-31: qty 1

## 2023-08-31 MED ORDER — NYSTATIN 100000 UNIT/GM EX POWD
Freq: Three times a day (TID) | CUTANEOUS | Status: DC
  Filled 2023-08-31: qty 15

## 2023-08-31 MED ORDER — FUROSEMIDE 20 MG PO TABS
60.0000 mg | ORAL_TABLET | Freq: Every day | ORAL | 0 refills | Status: DC
Start: 2023-08-31 — End: 2023-08-31
  Filled 2023-08-31: qty 30, 10d supply, fill #0

## 2023-08-31 MED ORDER — FUROSEMIDE 20 MG PO TABS
60.0000 mg | ORAL_TABLET | Freq: Every day | ORAL | 1 refills | Status: DC
  Filled 2023-08-31: qty 90, 30d supply, fill #0

## 2023-08-31 MED ORDER — FLUCONAZOLE 150 MG PO TABS
150.0000 mg | ORAL_TABLET | Freq: Once | ORAL | Status: AC
Start: 2023-08-31 — End: 2023-08-31
  Administered 2023-08-31: 150 mg via ORAL
  Filled 2023-08-31: qty 1

## 2023-08-31 NOTE — Progress Notes (Signed)
 Pt has extensive amount of MASD in her groin, labia, & under her left breast. Images now in chart, Inter-dry cloth applied to right groin & under left breast. Attempted to notify [facility], MD for possible Nystatin powder.  Sonjia Durie, RN

## 2023-08-31 NOTE — TOC Transition Note (Signed)
 Transition of Care [facility]) - Discharge Note   Patient Details  Name: Nicole Gibbs MRN: [id number] Date of Birth: [birth year]  Transition of Care [facility]) CM/SW Contact:  Jeani Mill, RN Phone Number: 08/31/2023, 12:21 PM   Clinical Narrative:    Patient stable for discharge.  This RNCM offered choice for Home  Health, Patient states she has no preference, RNCM made referral to [facility] with Gasper Karst, He is able to take referral.  Patient has all needed DME.  Address, Phone number and PCP verified.    Final next level of care: Home w Home Health Services Barriers to Discharge: Barriers Resolved   Patient Goals and CMS Choice Patient states their goals for this hospitalization and ongoing recovery are:: to return home CMS Medicare.gov Compare Post Acute Care list provided to:: Patient Choice offered to / list presented to : Patient      Discharge Placement                 Home      Discharge Plan and Services Additional resources added to the After Visit Summary for                            HH Arranged: RN, PT, OT [facility] Agency: [facility] Date [facility] Agency Contacted: 08/31/23 Time HH Agency Contacted: 1216 Representative spoke with at [facility] Agency: Randel Buss  Social Drivers of Health (SDOH) Interventions SDOH Screenings   Food Insecurity: No Food Insecurity (08/29/2023)  Housing: Low Risk  (08/29/2023)  Transportation Needs: No Transportation Needs (08/29/2023)  Utilities: Not At Risk (08/29/2023)  Depression (PHQ2-9): Medium Risk (01/07/2020)  Financial Resource Strain: Low Risk  (04/05/2021)  Social Connections: Socially Isolated (08/29/2023)  Tobacco Use: Low Risk  (08/28/2023)     Readmission Risk Interventions     No data to display

## 2023-08-31 NOTE — Progress Notes (Signed)
 PHARMACY - ANTICOAGULATION CONSULT NOTE  Pharmacy Consult for warfarin Indication: pulmonary embolus /DVT history; Acute afib   Allergies  Allergen Reactions   Keflex [Cephalexin] Diarrhea   Sulfa Antibiotics Hives    Patient Measurements: Height: 5' (152.4 cm) Weight: 99.8 kg (220 lb 0.3 oz) IBW/kg (Calculated) : 45.5 HEPARIN DW (KG): 69.7  Vital Signs: Temp: 97.8 F (36.6 C) (04/17 0735) Temp Source: Axillary (04/17 0735) BP: 109/71 (04/17 0735) Pulse Rate: 74 (04/17 0735)  Labs: Recent Labs    08/29/23 0218 08/30/23 0234 08/31/23 0644  HGB 14.2  --   --   HCT 43.8  --   --   PLT 285  --   --   LABPROT 33.0* 23.1* 21.0*  INR 3.2* 2.0* 1.8*  CREATININE 1.48* 1.05* 0.95    Estimated Creatinine Clearance: 54.3 mL/min (by C-G formula based on SCr of 0.95 mg/dL).   Medical History: Past Medical History:  Diagnosis Date   Anxiety    Arthritis    knees   Chronic combined systolic and diastolic CHF (congestive heart failure) (HCC)    a. 07/2012 Echo: EF 55-60%, Gr1 DD;  b. 06/2015 Echo: EF 35-40%, triv AI, Mod MR, mod dil LA, mildly reduced RV.   Depression    DVT, bilateral lower limbs (HCC)    H/O cardiovascular stress test    a. 07/2012 MV: fixed mid-dist anterior and apical defect - scar vs attenuation, distal ant HK, no reversibility, EF 51%-->low risk.   Hx of blood clots    Hypertensive heart disease    Hypothyroidism    Joint pain    Nocturia    Normal coronary arteries    by cardiac catheterization 09/03/15   Obese    OSA on CPAP    Postmenopausal vaginal bleeding    Pulmonary embolism, bilateral (HCC)    a. chronic coumadin.   Rash    under breasts   Skin cancer    Sleep apnea    SOB (shortness of breath)    Swelling of both lower extremities    Thyroid nodule    no meds    Assessment: Patient admitted with CC of SOB and generalized weakness. Hx of PE/DVT on warfarin PTA, regimen PTA 3mg  warfarin daily. Last dose 4/12. Now with New acute  afib w/RVR  INR trend:  3.4 >3.2>2.0>1.8 today, down likely due to no dose 4/13, lower dose of 1mg  given 4/14 because INR was >3;  no bleeding noted.  CBC wnl stable 4/15 PTA Warf dose: 3mg  daily LD taken 4/12   Re: Methimazole (Tapazole) potential drug-drug interaction with warfarin. Can alter effect of Warfarin. Methimazole resumed inpatient on 08/28/23  (pt reported not taking PTA). On 08/31/23  Dr. Rudine Cos noted on discharge summary that he discussed with patient and we will hold off on any ongoing Methimazole on discharge orders until she follows with her outpatient endocrinologist.    Goal of Therapy:  INR 2-3 Monitor platelets by anticoagulation protocol: Yes   Plan:  Patient being discharged today , to resume PTA dose Wafarin 3mg  daily .   08/31/2023,12:31 PM

## 2023-08-31 NOTE — Progress Notes (Signed)
 Heart Failure Navigator Progress Note  Assessed for Heart & Vascular TOC clinic readiness.  Patient does not meet criteria due to patient has a scheduled [facility] appointment on 09/19/2023. .   Navigator will sign off at this time.   Randie Bustle, BSN, Scientist, clinical (histocompatibility and immunogenetics) Only

## 2023-08-31 NOTE — Telephone Encounter (Signed)
 Patient Product/process development scientist completed.    The patient is insured through [employer]. Patient has Medicare and is not eligible for a copay card, but may be able to apply for patient assistance or Medicare RX Payment Plan (Patient Must reach out to their plan, if eligible for payment plan), if available.    Ran test claim for Eliquis 5 mg and the current 30 day co-pay is $142.14 due to deductible  Ran test claim for Xarelto 20 mg and the current 30 day co-pay is $140.22 due to deductible.   This test claim was processed through [facility]- copay amounts may vary at other pharmacies due to pharmacy/plan contracts, or as the patient moves through the different stages of their insurance plan.     Morgan Arab, CPHT Pharmacy Technician III Certified Patient Advocate [facility] Pharm[facility] Patient Advocate Team Direct Number: [phone]  Fax: [phone]

## 2023-08-31 NOTE — Plan of Care (Signed)

## 2023-08-31 NOTE — Evaluation (Signed)
 Occupational Therapy Evaluation Patient Details Name: Nicole Gibbs MRN: [id number] DOB: 01-19-1946 Today's Date: 08/31/2023   History of Present Illness   78 y.o. female [facility] to [facility] 08/28/23 with dyspnea on exertion. Pt with a-fib w/ RVR requiring cardioversion in ED. Pt also with AKI and acute on chronic HF. PMHx: chronic combined systolic and diastolic HF, hx of DVT/PE on coumadin, HTN, hyperthyroidism, obesity     Clinical Impressions Pt walks with a cane, drives and is modified independent in IADLs and IADLs. Presents with dynamic balance impairment and generalized weakness. Trialed a quad cane, pt reports it feeling too heavy. Pt is overall functioning at a modified independent level in ADLs in this environment. Recommending HHOT to address home safety, make equipment recommendations for bathroom and overall safety assessment. Pt to discharge home today.     If plan is discharge home, recommend the following:   Assist for transportation     Functional Status Assessment   Patient has had a recent decline in their functional status and demonstrates the ability to make significant improvements in function in a reasonable and predictable amount of time.     Equipment Recommendations   None recommended by OT     Recommendations for Other Services         Precautions/Restrictions   Precautions Precautions: Fall Recall of Precautions/Restrictions: Intact Restrictions Weight Bearing Restrictions Per Provider Order: No     Mobility Bed Mobility               General bed mobility comments: seated EOB    Transfers Overall transfer level: Modified independent Equipment used: Quad cane               General transfer comment: trialed quad cane      Balance Overall balance assessment: Needs assistance, Mild deficits observed, not formally tested   Sitting balance-Leahy Scale: Good     Standing balance support: Single extremity supported, During  functional activity, Reliant on assistive device for balance Standing balance-Leahy Scale: Fair                             ADL either performed or assessed with clinical judgement   ADL Overall ADL's : At baseline                                             Vision Ability to See in Adequate Light: 0 Adequate Patient Visual Report: No change from baseline       Perception         Praxis         Pertinent Vitals/Pain Pain Assessment Pain Assessment: No/denies pain     Extremity/Trunk Assessment Upper Extremity Assessment Upper Extremity Assessment: Overall WFL for tasks assessed   Lower Extremity Assessment Lower Extremity Assessment: Defer to PT evaluation       Communication Communication Communication: No apparent difficulties   Cognition Arousal: Alert Behavior During Therapy: WFL for tasks assessed/performed Cognition: No apparent impairments                               Following commands: Intact       Cueing  General Comments   Cueing Techniques: Verbal cues      Exercises     Shoulder Instructions  Home Living Family/patient expects to be discharged to:: Private residence Living Arrangements: Alone Available Help at Discharge: Neighbor;Available PRN/intermittently Type of Home: House Home Access: Level entry     Home Layout: Two level;Bed/bath upstairs;1/2 bath on main level Alternate Level Stairs-Number of Steps: 13 Alternate Level Stairs-Rails: Right Bathroom Shower/Tub: Tub/shower unit;Walk-in shower   Bathroom Toilet: Handicapped height     Home Equipment: Cane - single point;Standard Walker;BSC/3in1;Wheelchair Financial trader (4 wheels)          Prior Functioning/Environment Prior Level of Function : Independent/Modified Independent;Driving             Mobility Comments: ModI with SP cane ADLs Comments: Ind    OT Problem List:     OT Treatment/Interventions:         OT Goals(Current goals can be found in the care plan section)       OT Frequency:       Co-evaluation              AM-PAC OT "6 Clicks" Daily Activity     Outcome Measure Help from another person eating meals?: None Help from another person taking care of personal grooming?: None Help from another person toileting, which includes using toliet, bedpan, or urinal?: None Help from another person bathing (including washing, rinsing, drying)?: None Help from another person to put on and taking off regular upper body clothing?: None Help from another person to put on and taking off regular lower body clothing?: None 6 Click Score: 24   End of Session    Activity Tolerance: Patient tolerated treatment well Patient left: in bed;with call bell/phone within reach  OT Visit Diagnosis: Unsteadiness on feet (R26.81)                Time: 1140-1200 OT Time Calculation (min): 20 min Charges:  OT General Charges $OT Visit: 1 Visit OT Evaluation $OT Eval Low Complexity: 1 Low  Avanell Leigh, OTR/L Acute Rehabilitation Services Office: [phone]   Jonette Nestle 08/31/2023, 12:30 PM

## 2023-08-31 NOTE — Progress Notes (Addendum)
 Patient Name: Nicole Gibbs Date of Encounter: 08/31/2023 [facility] Cardiologist: Nanetta Batty, MD   Interval Summary  .    Patient reports feeling well this AM. Breathing and lower extremity swelling have improved. She is concerned that we will want her on twice daily lasix at discharge. She prefers to be on once a day dosing due to frequent urination. She also wants to avoid jardiance due to yeast infections   Vital Signs .    Vitals:   08/30/23 1125 08/30/23 1625 08/31/23 0645 08/31/23 0735  BP: 105/62 (!) 113/55  109/71  Pulse: 79 77  74  Resp: 17 17 15 20   Temp: 98.2 F (36.8 C) 98.5 F (36.9 C)  97.8 F (36.6 C)  TempSrc: Oral Oral  Axillary  SpO2: 95% 97%  92%  Weight:   99.8 kg   Height:        Intake/Output Summary (Last 24 hours) at 08/31/2023 0934 Last data filed at 08/31/2023 0743 Gross per 24 hour  Intake 960 ml  Output 2900 ml  Net -1940 ml      08/31/2023    6:45 AM 08/30/2023    5:07 AM 08/29/2023    6:04 AM  Last 3 Weights  Weight (lbs) 220 lb 0.3 oz 220 lb 14.4 oz 219 lb 12.8 oz  Weight (kg) 99.8 kg 100.2 kg 99.7 kg      Telemetry/ECG    Nsr with PVCs, brief 3 beat runs of NSVT - Personally Reviewed  Physical Exam .   GEN: No acute distress.  Sitting comfortably on the side of the bed  Neck: No JVD Cardiac:  RRR, no murmurs, rubs, or gallops.  Respiratory: Clear to auscultation bilaterally. Normal WOB on room air  GI: Soft, nontender, non-distended  MS: No edema in BLE   Assessment & Plan .     Atrial Fibrillation with RVR  NSVT, PVCs SVT - Patient presented with worsening shortness of breath and was found to be in rapid afib with HR as high as the 200s. She had been on coumadin for history of DVT/PE, INR mildly subtherapeutic. She underwent successful cardioversion in the ED with restoration of NSR  - Now maintaining NSR with PVCs, rare 3 beat runs of NSVT  - Continue metoprolol tartrate 25 mg BID  - Continue coumadin- dosed  by pharmacy as an inpatient, followed by our coumadin clinic as an outpatient. She would like to discuss transition to DOAC, but these medications have been costly in the past. Asked pharmacy to check DOAC cost  ADDENDUM- discussed with pharmacy. Copays for eliquis and xarelto are around $140.   Acute on chronic combined systolic and diastolic heart failure  RV dysfunction  Mild MR, mild AI  - Previously had EF as low as 25-30% in the past, improved to 40-45% in 2023. Echo this admission showed EF 30-35%, grade II DD, moderately reduced RV systolic function, mild MR, mild AI  - BNP initially elevated to 700. Initial chest x-ray with mild diffuse pulmonary vascular congestion, likely accentuated by low lung volume, but no frank pulmonary edema.  - She has been on IV lasix 40 mg Bid- output 3 L urine yesterday and is currently net -5.2 L since admission. Creatinine improved from 1.81 on admission to 0.95 today  - Euvolemic on exam today  - Transition to PO  lasix 60 mg daily  - Continue metoprolol tartrate 25 mg BID  - Continue spironolactone 25 mg daily  - Not on  SGLT2i due to history of yeast infection   AKI  - Creatinine was 1.81 on admission. Improved to 0.95 today after diuresis   OSA  - Encouraged CPAP, patient has declined   Otherwise per primary  - Obesity  - Hyperthyroidism  - History of PE/DVT- on coumadin   For questions or updates, please contact [facility] Please consult [email or web address] for contact info under        Signed, Debria Fang, PA-C    I have personally seen and examined the patient.  My HPI, Exam, and assessment and plan are below, independent of the NPP above.  Patient notes no issues save for cane management. No CP, SOB, Palpitations.  Exam notable for  Gen: no distress,  morbid obesity Neck: No JVD Cardiac: No Rubs or Gallops, no murmur, RRR +2 Respiratory: Clear to auscultation bilaterally, normal effort, normal  respiratory  rate GI: Soft, nontender, non-distended  MS: No  edema;  moves all extremities Integument: Skin feels warm Neuro:  At time of evaluation, alert and oriented to person/place/time/situation  Psych: Normal affect, patient feels ok  Tele: SR with PVCs  In assessment and plan:   Atrial Fibrillation with RVR  NSVT, PVCs; SVT S/p successful cardioversion in the ED with restoration of NSR  - Continue metoprolol tartrate 25 mg BID (new) - Unable to afford DOAC, on warfarin  Acute on chronic combined systolic and diastolic heart failure  RV dysfunction  Mild MR, mild AI  - Euvolemic, NYHA I - Daily PO  lasix 60 mg  (no BID dosing as outpatient)  - Continue metoprolol tartrate 25 mg BID  - Continue spironolactone 25 mg daily  - Not on SGLT2i due to history of yeast infection, re-eval as outpatint  AKI  - Creatinine was 1.81 on admission. Now resolved  OSA  - Outpatient re-evaluation by Dr. Arlana Bellini team or dentistry  OP f/u with Dr. Arlester Ladd team    Gloriann Larger, MD FASE [facility] Cardiologist [facility]  [facility]  [address] [place], [address] [place], [place] [address] [phone]  12:01 PM

## 2023-08-31 NOTE — Care Management Important Message (Signed)
 Important Message  Patient Details  Name: Nicole Gibbs MRN: [id number] Date of Birth: 1945-05-25   Important Message Given:  Yes - Medicare IM     Wynonia Hedges 08/31/2023, 12:13 PM

## 2023-08-31 NOTE — Evaluation (Signed)
 Physical Therapy Evaluation Patient Details Name: Nicole Gibbs MRN: [id number] DOB: 03-10-1946 Today's Date: 08/31/2023  History of Present Illness  78 y.o. female presents to [facility] 08/28/23 with dyspnea on exertion. Pt with a-fib w/ RVR requiring cardioversion in ED. Pt also with AKI and acute on chronic HF. PMHx: chronic combined systolic and diastolic HF, hx of DVT/PE on coumadin, HTN, hyperthyroidism, obesity   Clinical Impression  Pt in bed upon arrival and agreeable to PT eval. PTA, pt was ModI with a SP cane. In today's session pt was CGA/supervision to stand and ambulate. Pt was slightly unsteady with a SP cane, however, was able to ambulate ~160 ft with CGA. She had improved stability with a RW requiring supervision to ambulate ~160 ft. Pt was fatigued after two gait trials and was unable to work on stair negotiation. Demonstrated how to fold and bring RW while ascending steps with pt reporting she would prefer to keep one walker at the bottom of the steps and one at the top. Pt currently with functional limitations due to the deficits listed below (see PT Problem List). Pt would benefit from acute skilled PT to address functional impairments. Recommending post-acute HHPT to work towards independence with mobility. Acute PT to follow.     82 BPM at rest, 82-95 BPM during gait    If plan is discharge home, recommend the following: A little help with walking and/or transfers;Assist for transportation;Help with stairs or ramp for entrance   Can travel by private vehicle    Yes    Equipment Recommendations Rolling walker (2 wheels)     Functional Status Assessment Patient has had a recent decline in their functional status and demonstrates the ability to make significant improvements in function in a reasonable and predictable amount of time.     Precautions / Restrictions Precautions Precautions: Fall Recall of Precautions/Restrictions: Intact Restrictions Weight Bearing Restrictions  Per Provider Order: No      Mobility  Bed Mobility Overal bed mobility: Modified Independent  General bed mobility comments: HOB elevated    Transfers Overall transfer level: Needs assistance Equipment used: Rolling walker (2 wheels), Straight cane Transfers: Sit to/from Stand Sit to Stand: Supervision, Contact guard assist  General transfer comment: CGA for safety with SP cane, Supervision with use of RW. Cues for hand placement with RW    Ambulation/Gait Ambulation/Gait assistance: Supervision, Contact guard assist Gait Distance (Feet): 160 Feet (x160, x160) Assistive device: Rolling walker (2 wheels), Straight cane Gait Pattern/deviations: Step-through pattern, Decreased stride length, Shuffle, Wide base of support Gait velocity: decr     General Gait Details: slightly unsteady with increased medial/lateral sway with SP cane. Improved stability with RW    Balance Overall balance assessment: Needs assistance, Mild deficits observed, not formally tested Sitting-balance support: Feet supported, No upper extremity supported Sitting balance-Leahy Scale: Good     Standing balance support: Single extremity supported, During functional activity, Reliant on assistive device for balance Standing balance-Leahy Scale: Fair Standing balance comment: able to stand statically with no AD        Pertinent Vitals/Pain Pain Assessment Pain Assessment: No/denies pain    Home Living Family/patient expects to be discharged to:: Private residence Living Arrangements: Alone Available Help at Discharge: Neighbor;Available PRN/intermittently Type of Home: House Home Access: Level entry     Alternate Level Stairs-Number of Steps: 13 Home Layout: Two level;Bed/bath upstairs;1/2 bath on main level Home Equipment: Cane - single point;Standard Walker;BSC/3in1;Wheelchair - manual      Prior Function Prior  Level of Function : Independent/Modified Independent    Mobility Comments: ModI  with SP cane ADLs Comments: Ind     Extremity/Trunk Assessment   Upper Extremity Assessment Upper Extremity Assessment: Defer to OT evaluation    Lower Extremity Assessment Lower Extremity Assessment: Generalized weakness       Communication   Communication Communication: No apparent difficulties    Cognition Arousal: Alert Behavior During Therapy: WFL for tasks assessed/performed   PT - Cognitive impairments: No apparent impairments   Following commands: Intact       Cueing Cueing Techniques: Verbal cues         PT Assessment Patient needs continued PT services  PT Problem List Decreased strength;Decreased activity tolerance;Decreased balance;Decreased mobility;Decreased knowledge of use of DME       PT Treatment Interventions DME instruction;Gait training;Stair training;Functional mobility training;Therapeutic activities;Therapeutic exercise;Balance training;Neuromuscular re-education;Patient/family education    PT Goals (Current goals can be found in the Care Plan section)  Acute Rehab PT Goals Patient Stated Goal: to prevent future falls PT Goal Formulation: With patient Time For Goal Achievement: 09/14/23 Potential to Achieve Goals: Good    Frequency Min 2X/week        AM-PAC PT "6 Clicks" Mobility  Outcome Measure Help needed turning from your back to your side while in a flat bed without using bedrails?: None Help needed moving from lying on your back to sitting on the side of a flat bed without using bedrails?: A Little Help needed moving to and from a bed to a chair (including a wheelchair)?: A Little Help needed standing up from a chair using your arms (e.g., wheelchair or bedside chair)?: A Little Help needed to walk in hospital room?: A Little Help needed climbing 3-5 steps with a railing? : A Little 6 Click Score: 19    End of Session   Activity Tolerance: Patient tolerated treatment well Patient left: in bed;with call bell/phone within  reach Nurse Communication: Mobility status PT Visit Diagnosis: Unsteadiness on feet (R26.81);Muscle weakness (generalized) (M62.81)    Time: 0827-0900 PT Time Calculation (min) (ACUTE ONLY): 33 min   Charges:   PT Evaluation $PT Eval Low Complexity: 1 Low PT Treatments $Gait Training: 8-22 mins PT General Charges $$ ACUTE PT VISIT: 1 Visit        Orysia Blas, PT, DPT Secure Chat Preferred  Rehab Office [phone]   Alissa April Adela Ades 08/31/2023, 9:18 AM

## 2023-08-31 NOTE — Discharge Summary (Signed)
 Physician Discharge Summary  Nicole Gibbs XBJ:[id number] DOB: 07/25/1945 DOA: 08/28/2023  PCP: [name], FNP  Admit date: 08/28/2023 Discharge date: 08/31/2023  Admitted From: Home Disposition: Home  Recommendations for Outpatient Follow-up:  Follow up with PCP in 1-2 weeks Follow-up with cardiology as explained in 1 to 2 weeks  Home Health: Nicole Gibbs OT nursing Equipment/Devices: No new equipment  Discharge Condition: Stable CODE STATUS: Full Diet recommendation: Low-salt low-fat low-carb fluid restricted diet  Brief/Interim Summary: Nicole Gibbs is a 78 y.o. female with medical history significant of chronic combined systolic and diastolic HF, hx of DVT/PE on coumadin, hypertension, hyperthyroidism, obesity, OSA on CPAP presenting to the ED with shortness of breath with exertion. Patient reports that she has noted shortness of breath with exertion over the past week but it has worsened since this past Saturday.  Patient required cardioversion in the ED, hospitalist called for admission.  Cardiology called in consult.  Patient admitted as above with acute symptomatic A-fib with RVR as well as NSVT/SVT, cardiology consulted with marked improvement on metoprolol.  She continues on Coumadin for anticoagulation.  During hospitalization patient had notable elevated T4, albeit minimally, with normal TSH and history of hyperthyroidism.  Discussed with patient and we will hold off on any ongoing methimazole until she follows with her outpatient endocrinologist given the minimally elevated labs here this is likely not the primary etiology for her tachy/dysrhythmia.  Given patient's borderline hypotension during hospitalization we will discontinue losartan follow along clinically, explained patient to continue diuretics as well as recommended daily weights to more closely monitor her volume status as she has not been doing this and simply relying on her clinical appearance.  At this time she is otherwise  essentially back to baseline, heart rate well-controlled on metoprolol given with exertion, recommending Nicole Gibbs OT and nursing at discharge to continue ongoing physical therapy, ensure safe home disposition as well as to monitor patient given new medications and need for Coumadin labs/phlebotomy.   Discharge Diagnoses:  Principal Problem:   AKI (acute kidney injury) (HCC) Active Problems:   Atrial fibrillation with RVR (HCC)  Acute symptomatic Atrial Fibrillation with RVR, NSVT/SVT - Rate controlled via cardioversion in the ED now on metoprolol -Unclear provocation, unlikely minimally elevated T4, possible hypotension versus heart failure exacerbation as below but now resolved   AKI, pre-renal, resolved Hypotension, ongoing -Patient with baseline creatinine around 0.8.  On admission, creatinine 1.81.  - Discontinue amlodipine, continue diuretics   Yeast infection, cellulitis, POA Fluconazole PO x1 Continue nystatin topical until resolved - follow up PCP as discussed  Essential hypertension, currently hypotensive -See above, continue metoprolol only at this time per cardiology   Elevated troponin in the setting of hypotension, AKI and tachycardia. ACS ruled out Cardiology following, likely provoked in the setting of tachycardia, no indication for further evaluation or workup at this time   Hx of DVT/PE -resume coumadin per pharmacy - Continue metoprolol   Chronic combined systolic and diastolic HF -Prior EF 40-45%(improved from 20-25% previously) - Cardiology following, no indication for repeat imaging at this time - Does not appear to be in acute exacerbation or decompensation - Continue increased dose at discharge   Hyperthyroidism - Unlikely involved in patient's A-fib with RVR given normal TSH and minimally elevated T4 - Recommend ongoing outpatient follow-up   OSA  -resume CPAP at bedtime   Discharge Instructions  Discharge Instructions     (HEART FAILURE PATIENTS)  Call MD:  Anytime you have any of the following symptoms:  1) 3 pound weight gain in 24 hours or 5 pounds in 1 week 2) shortness of breath, with or without a dry hacking cough 3) swelling in the hands, feet or stomach 4) if you have to sleep on extra pillows at night in order to breathe.   Complete by: As directed    Call MD for:  difficulty breathing, headache or visual disturbances   Complete by: As directed    Call MD for:  extreme fatigue   Complete by: As directed    Call MD for:  hives   Complete by: As directed    Call MD for:  persistant dizziness or light-headedness   Complete by: As directed    Call MD for:  persistant nausea and vomiting   Complete by: As directed    Call MD for:  severe uncontrolled pain   Complete by: As directed    Call MD for:  temperature >100.4   Complete by: As directed    Diet - low sodium heart healthy   Complete by: As directed    Increase activity slowly   Complete by: As directed    No wound care   Complete by: As directed       Allergies as of 08/31/2023       Reactions   Keflex [cephalexin] Diarrhea   Sulfa Antibiotics Hives        Medication List     STOP taking these medications    Jardiance 10 MG Tabs tablet Generic drug: empagliflozin   losartan 50 MG tablet Commonly known as: COZAAR       TAKE these medications    acetaminophen 325 MG tablet Commonly known as: TYLENOL Take 650 mg by mouth every 6 (six) hours as needed for moderate pain.   alendronate 70 MG tablet Commonly known as: FOSAMAX Take 70 mg by mouth once a week.   CALCIUM PO Take 1 capsule by mouth daily at 12 noon.   furosemide 20 MG tablet Commonly known as: LASIX Take 3 tablets (60 mg total) by mouth daily. What changed:  medication strength how much to take   medroxyPROGESTERone 5 MG tablet Commonly known as: PROVERA Take 5 mg by mouth daily in the afternoon.   metoprolol tartrate 25 MG tablet Commonly known as: LOPRESSOR Take 1 tablet  (25 mg total) by mouth 2 (two) times daily.   nystatin cream Commonly known as: MYCOSTATIN Apply 1 application topically 2 (two) times daily. What changed:  when to take this reasons to take this   spironolactone 25 MG tablet Commonly known as: ALDACTONE TAKE 1 TABLET(25 MG) BY MOUTH DAILY What changed: See the new instructions.   VITAMIN D3 PO Take 1 capsule by mouth daily at 12 noon.   warfarin 3 MG tablet Commonly known as: COUMADIN Take as directed. If you are unsure how to take this medication, talk to your nurse or doctor. Original instructions: TAKE 1 TABLET BY MOUTH EVERY DAY AS DIRECTED BY COUMADIN CLINIC What changed:  how much to take how to take this when to take this additional instructions   zolpidem 5 MG tablet Commonly known as: AMBIEN Take 5 mg by mouth at bedtime.        Allergies  Allergen Reactions   Keflex [Cephalexin] Diarrhea   Sulfa Antibiotics Hives    Consultations: Cardiology   Procedures/Studies: ECHOCARDIOGRAM COMPLETE Result Date: 08/28/2023    ECHOCARDIOGRAM REPORT   Patient Name:   Nicole Gibbs Date of Exam: 08/28/2023 Medical Rec #:  [id number]   Height:       60.0 in Accession #:    [id number]  Weight:       220.0 lb Date of Birth:  [birth year]    BSA:          1.944 m Patient Age:    77 years    BP:           121/77 mmHg Patient Gender: F           HR:           76 bpm. Exam Location:  Inpatient Procedure: 2D Echo, Cardiac Doppler, Color Doppler and Intracardiac            Opacification Agent (Both Spectral and Color Flow Doppler were            utilized during procedure). Indications:    Afib I48.91  History:        Patient has prior history of Echocardiogram examinations, most                 recent 11/25/2020.  Sonographer:    Harriette Bouillon RDCS Referring Phys: [id number] SAGAR H JINWALA IMPRESSIONS  1. Left ventricular ejection fraction, by estimation, is 30 to 35%. The left ventricle has moderately decreased function. The left ventricle  demonstrates global hypokinesis. The left ventricular internal cavity size was mildly dilated. There is mild concentric left ventricular hypertrophy. Left ventricular diastolic parameters are consistent with Grade II diastolic dysfunction (pseudonormalization).  2. Right ventricular systolic function is moderately reduced. The right ventricular size is normal.  3. Left atrial size was moderately dilated.  4. The mitral valve is degenerative. Mild mitral valve regurgitation. No evidence of mitral stenosis.  5. The aortic valve is tricuspid. There is mild calcification of the aortic valve. Aortic valve regurgitation is mild. No aortic stenosis is present.  6. The inferior vena cava is dilated in size with <50% respiratory variability, suggesting right atrial pressure of 15 mmHg. FINDINGS  Left Ventricle: Left ventricular ejection fraction, by estimation, is 30 to 35%. The left ventricle has moderately decreased function. The left ventricle demonstrates global hypokinesis. The left ventricular internal cavity size was mildly dilated. There is mild concentric left ventricular hypertrophy. Left ventricular diastolic parameters are consistent with Grade II diastolic dysfunction (pseudonormalization). Right Ventricle: The right ventricular size is normal. No increase in right ventricular wall thickness. Right ventricular systolic function is moderately reduced. Left Atrium: Left atrial size was moderately dilated. Right Atrium: Right atrial size was normal in size. Pericardium: There is no evidence of pericardial effusion. Mitral Valve: The mitral valve is degenerative in appearance. Mild to moderate mitral annular calcification. Mild mitral valve regurgitation. No evidence of mitral valve stenosis. Tricuspid Valve: The tricuspid valve is normal in structure. Tricuspid valve regurgitation is trivial. No evidence of tricuspid stenosis. Aortic Valve: The aortic valve is tricuspid. There is mild calcification of the aortic  valve. Aortic valve regurgitation is mild. No aortic stenosis is present. Pulmonic Valve: The pulmonic valve was normal in structure. Pulmonic valve regurgitation is trivial. No evidence of pulmonic stenosis. Aorta: The aortic root is normal in size and structure. Venous: The inferior vena cava is dilated in size with less than 50% respiratory variability, suggesting right atrial pressure of 15 mmHg. IAS/Shunts: No atrial level shunt detected by color flow Doppler.  LEFT VENTRICLE PLAX 2D LVIDd:         5.80 cm      Diastology LVIDs:  5.20 cm      LV e' medial:    5.55 cm/s LV PW:         1.30 cm      LV E/e' medial:  24.9 LV IVS:        1.20 cm      LV e' lateral:   5.33 cm/s LVOT diam:     1.80 cm      LV E/e' lateral: 25.9 LVOT Area:     2.54 cm  LV Volumes (MOD) LV vol d, MOD A2C: 170.0 ml LV vol d, MOD A4C: 167.0 ml LV vol s, MOD A2C: 104.0 ml LV vol s, MOD A4C: 110.0 ml LV SV MOD A2C:     66.0 ml LV SV MOD A4C:     167.0 ml LV SV MOD BP:      58.0 ml RIGHT VENTRICLE         IVC TAPSE (M-mode): 1.8 cm  IVC diam: 2.80 cm LEFT ATRIUM             Index        RIGHT ATRIUM           Index LA diam:        4.00 cm 2.06 cm/m   RA Area:     16.30 cm LA Vol (A2C):   92.3 ml 47.49 ml/m  RA Volume:   39.60 ml  20.37 ml/m LA Vol (A4C):   58.7 ml 30.20 ml/m LA Biplane Vol: 75.9 ml 39.05 ml/m   AORTA Ao Root diam: 2.60 cm Ao Asc diam:  3.40 cm MITRAL VALVE MV Area (PHT): 3.01 cm     SHUNTS MV Decel Time: 252 msec     Systemic Diam: 1.80 cm MV E velocity: 138.00 cm/s MV A velocity: 121.00 cm/s MV E/A ratio:  1.14 Arvilla Meres MD Electronically signed by Arvilla Meres MD Signature Date/Time: 08/28/2023/3:36:50 PM    Final    US RENAL Result Date: 08/28/2023 CLINICAL DATA:  [id number] AKI (acute kidney injury) (HCC) [id number]. EXAM: RENAL / URINARY TRACT ULTRASOUND COMPLETE COMPARISON:  None Available. FINDINGS: Right Kidney: Renal measurements: 5.3 x 5.5 x 10.0 cm. = volume: 152 mL. Echogenicity within normal  limits. No mass or hydronephrosis visualized. Left Kidney: Renal measurements: 4.5 x 5.1 x 9.9 cm. = volume: 118.7 mL. Echogenicity within normal limits. No mass or hydronephrosis visualized. Bladder: Appears normal for degree of bladder distention. Other: None. IMPRESSION: *Unremarkable renal ultrasound exam. Electronically Signed   By: Jules Schick M.D.   On: 08/28/2023 13:15   DG Chest Portable 1 View Result Date: 08/28/2023 CLINICAL DATA:  Shortness of breath. EXAM: PORTABLE CHEST 1 VIEW COMPARISON:  11/29/2020. FINDINGS: Low lung volume. There is mild diffuse pulmonary vascular congestion, likely accentuated by low lung volume. No frank pulmonary edema. Bilateral lung fields are clear. Bilateral costophrenic angles are clear. Stable cardio-mediastinal silhouette. No acute osseous abnormalities. The soft tissues are within normal limits. IMPRESSION: Mild diffuse pulmonary vascular congestion, likely accentuated by low lung volume. No frank pulmonary edema. Electronically Signed   By: Jules Schick M.D.   On: 08/28/2023 09:41     Subjective: No acute issues or events overnight, heart rate remains well-controlled denies nausea vomiting diarrhea constipation headache fevers chills or chest pain   Discharge Exam: Vitals:   08/31/23 0645 08/31/23 0735  BP:  109/71  Pulse:  74  Resp: 15 20  Temp:  97.8 F (36.6 C)  SpO2:  92%   Vitals:  08/30/23 1125 08/30/23 1625 08/31/23 0645 08/31/23 0735  BP: 105/62 (!) 113/55  109/71  Pulse: 79 77  74  Resp: 17 17 15 20   Temp: 98.2 F (36.8 C) 98.5 F (36.9 C)  97.8 F (36.6 C)  TempSrc: Oral Oral  Axillary  SpO2: 95% 97%  92%  Weight:   99.8 kg   Height:        General: Nicole Gibbs is alert, awake, not in acute distress Cardiovascular: RRR, S1/S2 +, no rubs, no gallops Respiratory: CTA bilaterally, no wheezing, no rhonchi Abdominal: Soft, NT, ND, bowel sounds + Extremities: no edema, no cyanosis    The results of significant diagnostics from  this hospitalization (including imaging, microbiology, ancillary and laboratory) are listed below for reference.     Microbiology: No results found for this or any previous visit (from the past 240 hours).   Labs: BNP (last 3 results) Recent Labs    08/28/23 0850  BNP 700.4*   Basic Metabolic Panel: Recent Labs  Lab 08/28/23 0850 08/28/23 2132 08/29/23 0218 08/30/23 0234 08/31/23 0644  NA 137 140 140 139 139  K 4.5 3.7 3.9 3.8 4.1  CL 100 109 109 104 102  CO2 17* 20* 19* 24 27  GLUCOSE 136* 139* 106* 93 102*  BUN 42* 42* 43* 34* 29*  CREATININE 1.81* 1.68* 1.48* 1.05* 0.95  CALCIUM 8.7* 8.1* 8.0* 7.9* 8.6*  MG 2.6* 2.3  --  2.0 1.8   CBC: Recent Labs  Lab 08/28/23 0850 08/29/23 0218  WBC 11.5* 10.8*  NEUTROABS 8.5*  --   HGB 15.3* 14.2  HCT 48.8* 43.8  MCV 93.1 90.1  PLT 332 285   Thyroid function studies Recent Labs    08/28/23 1247  TSH 0.730   Urinalysis    Component Value Date/Time   COLORURINE YELLOW 08/29/2023 0608   APPEARANCEUR HAZY (A) 08/29/2023 0608   LABSPEC 1.015 08/29/2023 0608   PHURINE 5.0 08/29/2023 0608   GLUCOSEU >=500 (A) 08/29/2023 0608   HGBUR NEGATIVE 08/29/2023 0608   BILIRUBINUR NEGATIVE 08/29/2023 0608   KETONESUR NEGATIVE 08/29/2023 0608   PROTEINUR NEGATIVE 08/29/2023 0608   UROBILINOGEN 1.0 08/14/2012 0833   NITRITE NEGATIVE 08/29/2023 0608   LEUKOCYTESUR MODERATE (A) 08/29/2023 0608   Sepsis Labs Recent Labs  Lab 08/28/23 0850 08/29/23 0218  WBC 11.5* 10.8*   Microbiology No results found for this or any previous visit (from the past 240 hours).   Time coordinating discharge: Over 30 minutes  SIGNED:   Haydee Lipa, DO [facility] 08/31/2023, 11:49 AM Pager   If 7PM-7AM, please contact night-coverage [email or web address]

## 2023-09-01 ENCOUNTER — Telehealth: Payer: Self-pay

## 2023-09-01 DIAGNOSIS — I5022 Chronic systolic (congestive) heart failure: Secondary | ICD-10-CM

## 2023-09-01 DIAGNOSIS — I4891 Unspecified atrial fibrillation: Secondary | ICD-10-CM

## 2023-09-01 NOTE — Patient Instructions (Signed)
 Visit Information  Thank you for taking time to visit with me today. Please don't hesitate to contact me if I can be of assistance to you before our next scheduled telephone appointment.  Our next appointment is by telephone on 09/06/23 at 1030 am  Following is a copy of your care plan:   Goals Addressed             This Visit's Progress    VBCI Transitions of Care (TOC) Care Plan       Problems:  Recent Hospitalization for treatment of Atrial Fibrillation and CHF SDOH barrier: Transportation  Goal:  Over the next 30 days, the patient will not experience hospital readmission  Interventions:   AFIB Interventions:   Reviewed importance of adherence to anticoagulant exactly as prescribed Screening for signs and symptoms of depression related to chronic disease state    Heart Failure Interventions: Basic overview and discussion of pathophysiology of Heart Failure reviewed Provided education on low sodium diet Reviewed Heart Failure Action Plan in depth and provided written copy Provided education about placing scale on hard, flat surface Advised patient to weigh each morning after emptying bladder Discussed importance of daily weight and advised patient to weigh and record daily Reviewed role of diuretics in prevention of fluid overload and management of heart failure; Discussed the importance of keeping all appointments with provider  Patient Self Care Activities:  follow rescue plan if symptoms flare-up Obtain scale  Plan:  Telephone follow up appointment with care management team member scheduled for:  09/06/23 @ 1030 am The patient has been provided with contact information for the care management team and has been advised to call with any health related questions or concerns.         Patient verbalizes understanding of instructions and care plan provided today and agrees to view in MyChart. Active MyChart status and patient understanding of how to access instructions  and care plan via MyChart confirmed with patient.     The patient has been provided with contact information for the care management team and has been advised to call with any health related questions or concerns.   Please call the care guide team at [phone] if you need to cancel or reschedule your appointment.   Please call the Suicide and Crisis Lifeline: 988 if you are experiencing a Mental Health or Behavioral Health Crisis or need someone to talk to.  Harlan Ervine J. Anjanae Woehrle RN, MSN [facility], [facility] RN Care Manager Direct Dial: [phone]  Fax: [phone] Website: [email or web address]

## 2023-09-01 NOTE — Transitions of Care (Post Inpatient/ED Visit) (Signed)
 09/01/2023  Name: Nicole Gibbs MRN: [id number] DOB: Sep 02, 1945  Today's TOC FU Call Status: Today's TOC FU Call Status:: Successful TOC FU Call Completed TOC FU Call Complete Date: 09/01/23 Patient's Name and Date of Birth confirmed.  Transition Care Management Follow-up Telephone Call Date of Discharge: 08/31/23 Discharge Facility: [facility]) Type of Discharge: Inpatient Admission Primary Inpatient Discharge Diagnosis:: AKI (acute kidney injury) (HCC), A. Fibrillation How have you been since you were released from the hospital?: Better Any questions or concerns?: No  Items Reviewed: Did you receive and understand the discharge instructions provided?: Yes Medications obtained,verified, and reconciled?: Yes (Medications Reviewed) Any new allergies since your discharge?: No Dietary orders reviewed?: Yes Type of Diet Ordered:: Low-salt low-fat low-carb fluid restricted diet Do you have support at home?: No (She lives alone. Has friends and neighbors)  Medications Reviewed Today: Medications Reviewed Today     Reviewed by Amiri Riechers, RN (Case Manager) on 09/01/23 at 1048  Med List Status: <None>   Medication Order Taking? Sig Documenting Provider Last Dose Status Informant  acetaminophen  (TYLENOL ) 325 MG tablet [id number] Yes Take 650 mg by mouth every 6 (six) hours as needed for moderate pain. [provider] Taking Active Self, Pharmacy Records, Multiple Informants  alendronate (FOSAMAX) 70 MG tablet [id number] Yes Take 70 mg by mouth once a week. [provider] Taking Active Self, Pharmacy Records, Multiple Informants           Med Note Renata Moab Aug 28, 2023 12:51 PM) Due for dose  CALCIUM PO [id number] Yes Take 1 capsule by mouth daily at 12 noon. [provider] Taking Active Self, Pharmacy Records, Multiple Informants  Cholecalciferol (VITAMIN D3 PO) [id number] Yes Take 1 capsule by mouth daily at 12 noon. [provider]  Taking Active Self, Pharmacy Records, Multiple Informants  furosemide  (LASIX ) 20 MG tablet [id number] Yes Take 3 tablets (60 mg total) by mouth daily. Haydee Lipa, MD Taking Active   medroxyPROGESTERone (PROVERA) 5 MG tablet [id number] Yes Take 5 mg by mouth daily in the afternoon. [provider] Taking Active Self, Pharmacy Records, Multiple Informants  metoprolol  tartrate (LOPRESSOR ) 25 MG tablet [id number] Yes Take 1 tablet (25 mg total) by mouth 2 (two) times daily. Haydee Lipa, MD Taking Active   nystatin  cream (MYCOSTATIN ) [id number] Yes Apply 1 application topically 2 (two) times daily.  Patient taking differently: Apply 1 application  topically daily as needed for dry skin.   Montey Apa, DO Taking Active Self, Pharmacy Records, Multiple Informants  spironolactone  (ALDACTONE ) 25 MG tablet [id number] Yes TAKE 1 TABLET(25 MG) BY MOUTH DAILY  Patient taking differently: Take 25 mg by mouth daily.   Avanell Leigh, MD Taking Active Self, Pharmacy Records, Multiple Informants  warfarin (COUMADIN ) 3 MG tablet [id number] Yes TAKE 1 TABLET BY MOUTH EVERY DAY AS DIRECTED BY COUMADIN  CLINIC  Patient taking differently: Take 3 mg by mouth daily at 4 PM.   Avanell Leigh, MD Taking Active Self, Pharmacy Records, Multiple Informants  zolpidem  (AMBIEN ) 5 MG tablet [id number] Yes Take 5 mg by mouth at bedtime. [provider] Taking Active Self, Pharmacy Records, Multiple Informants           Med Note Caroleen Churn, Porfirio Bristol A   Wed Oct 26, 2022  3:50 PM) Has used on a longterm basis, est 10 yrs.  5mg  dose, split dose QHS and on middle of the night after waking.  Behavioral health is not acutely concerned.  --  A Mitchum, PhD            Home Care and Equipment/Supplies: Were Home Health Services Ordered?: Yes Name of Home Health Agency:: [facility] Has Agency set up a time to come to your home?: No EMR reviewed for Home Health Orders: Orders present/patient has not  received call (refer to CM for follow-up) Any new equipment or medical supplies ordered?: No  Functional Questionnaire: Do you need assistance with bathing/showering or dressing?: No Do you need assistance with meal preparation?: No Do you need assistance with eating?: No Do you have difficulty maintaining continence: No Do you need assistance with getting out of bed/getting out of a chair/moving?: No Do you have difficulty managing or taking your medications?: No  Follow up appointments reviewed: PCP Follow-up appointment confirmed?: No (patient to call) MD Provider Line Number:[phone] Given: No Specialist Hospital Follow-up appointment confirmed?: Yes Date of Specialist follow-up appointment?: 09/19/23 Follow-Up Specialty Provider:: Melissa Foutain Do you need transportation to your follow-up appointment?: Yes Transportation Need Intervention Addressed By:: AMB Referral For SDOH Needs Do you understand care options if your condition(s) worsen?: Yes-patient verbalized understanding  SDOH Interventions Today    Flowsheet Row Most Recent Value  SDOH Interventions   Food Insecurity Interventions Intervention Not Indicated  Housing Interventions Intervention Not Indicated  Transportation Interventions AMB Referral  [Referral completed for private transportation resources]  Utilities Interventions Intervention Not Indicated      Patient reports doing some better. She lives alone with minimal support. She still drives and has some friends and neighbors.  She is  having some weakness due to recent hospitalization.  PT and OT ordered but have not called. Patient does have contact information for home health if they do not contact by Monday.  Patient requests some resources for private transportation in case she does not feel like driving.  Referral done for transportation.  Reviewed with patient A. Fib and HF. Patient does not have a scale but will be getting one.  Hospital weight  yesterday was 220 lbs.  Discussed importance of weights.  Patient to call PCP and Cardiology for follow up by next CM phone call.  Reviewed TOC program with patient. She is agreeable to 30 day program.     Merritt Mccravy J. Haeden Hudock RN, MSN [facility], [facility] RN Care Manager Direct Dial: [phone]  Fax: [phone] Website: [email or web address]

## 2023-09-04 ENCOUNTER — Telehealth: Payer: Self-pay | Admitting: Cardiovascular Disease

## 2023-09-04 NOTE — Telephone Encounter (Signed)
 Patient says she was in the hospital Monday-Thursday and her coumadin  was 3.4 early last week. She was advised to cancel and reschedule 4/22 coumadin  for a later date. She assumes she will need to reschedule for about a month from now since 3.4 was a little high. Please advise.

## 2023-09-04 NOTE — Telephone Encounter (Signed)
 Attempted to call pt and pt's daughter to scheduled coumadin  clinic appt post hospitalization. No answer, left message on pt's daughter's voicemail. Unable to leave voicemail in pt's phone.

## 2023-09-04 NOTE — Telephone Encounter (Signed)
 Called and spoke with pt. I made pt aware since she has been recently hospitalized, her INR should be checked within 1 week. Pt states she is unable to come anytime soon - she feels like she needs to regain her strength. I did make her aware of risk. Scheduled coumadin  clinic visit for 09/18/23 at 2:15pm. Pt has scheduled appt with Palmer Bobo, NP on 09/19/23 at 8:25am; however, pt states she wants to reschedule this appt because it's too early in the morning for her. Pt is aware next coumadin  clinic appt [facility] on [address]. Transferred call to schedulers.

## 2023-09-05 ENCOUNTER — Ambulatory Visit

## 2023-09-05 ENCOUNTER — Telehealth: Payer: Self-pay

## 2023-09-05 NOTE — Progress Notes (Signed)
 Complex Care Management Note Care Guide Note  09/05/2023 Name: Nicole Gibbs MRN: [id number] DOB: 1946/04/16  Nicole Gibbs is a 78 y.o. year old female who is a primary care patient of Prevost, Mary Ellen, FNP . The community resource team was consulted for assistance with Transportation Needs   SDOH screenings and interventions completed:  Yes  Social Drivers of Health From This Encounter   Food Insecurity: No Food Insecurity (09/05/2023)   Hunger Vital Sign    Worried About Running Out of Food in the Last Year: Never true    Ran Out of Food in the Last Year: Never true  Housing: Low Risk  (09/05/2023)   Housing Stability Vital Sign    Unable to Pay for Housing in the Last Year: No    Number of Times Moved in the Last Year: 0    Homeless in the Last Year: No  Financial Resource Strain: Low Risk  (09/05/2023)   Overall Financial Resource Strain (CARDIA)    Difficulty of Paying Living Expenses: Not hard at all  Transportation Needs: No Transportation Needs (09/05/2023)   PRAPARE - Administrator, Civil Service (Medical): No    Lack of Transportation (Non-Medical): No  Utilities: Not At Risk (09/05/2023)   Utilities    Threatened with loss of utilities: No    SDOH Interventions Today    Flowsheet Row Most Recent Value  SDOH Interventions   Transportation Interventions Other (Comment)  [Patient stated she is not interested in applying for [employer] [place] at this time. I encouraged the patient to contact me if she changed her mind I will be glad to assist her. Patient has my name and contact number.]        Care guide performed the following interventions: Spoke with patient about [employer] [place] transportation asked if I could assist her with completing online application.  Patient stated she is not interested at this time. I encouraged the patient to contact me if she changed her mind I will be glad to assist her. Patient has my name and contact number. Mailing [employer] [place] brochure to  patient's home address.  Follow Up Plan:  No further follow up planned at this time. The patient has been provided with needed resources.  Encounter Outcome:  Patient Visit Completed  [facility]  [facility] Guide Direct Dial: [phone]  Fax: [phone] Website: [email or web address]

## 2023-09-06 ENCOUNTER — Ambulatory Visit (INDEPENDENT_AMBULATORY_CARE_PROVIDER_SITE_OTHER): Payer: Medicare Other | Admitting: Psychiatry

## 2023-09-06 ENCOUNTER — Other Ambulatory Visit: Payer: Self-pay

## 2023-09-06 DIAGNOSIS — R69 Illness, unspecified: Secondary | ICD-10-CM | POA: Diagnosis not present

## 2023-09-06 DIAGNOSIS — Z609 Problem related to social environment, unspecified: Secondary | ICD-10-CM

## 2023-09-06 DIAGNOSIS — F423 Hoarding disorder: Secondary | ICD-10-CM | POA: Diagnosis not present

## 2023-09-06 DIAGNOSIS — F40298 Other specified phobia: Secondary | ICD-10-CM | POA: Diagnosis not present

## 2023-09-06 DIAGNOSIS — E678 Other specified hyperalimentation: Secondary | ICD-10-CM | POA: Diagnosis not present

## 2023-09-06 DIAGNOSIS — F5104 Psychophysiologic insomnia: Secondary | ICD-10-CM | POA: Diagnosis not present

## 2023-09-06 DIAGNOSIS — Z604 Social exclusion and rejection: Secondary | ICD-10-CM | POA: Diagnosis not present

## 2023-09-06 DIAGNOSIS — F411 Generalized anxiety disorder: Secondary | ICD-10-CM

## 2023-09-06 NOTE — Patient Instructions (Signed)
 Visit Information  Thank you for taking time to visit with me today. Please don't hesitate to contact me if I can be of assistance to you before our next scheduled telephone appointment.  Our next appointment is by telephone on 09/13/23 at 1100 am  Following is a copy of your care plan:   Goals Addressed             This Visit's Progress    VBCI Transitions of Care (TOC) Care Plan       Problems:  Recent Hospitalization for treatment of Atrial Fibrillation and CHF SDOH barrier: Transportation  Goal:  Over the next 30 days, the patient will not experience hospital readmission  Interventions:   AFIB Interventions:   Reviewed importance of adherence to anticoagulant exactly as prescribed   Heart Failure Interventions: Basic overview and discussion of pathophysiology of Heart Failure reviewed Provided education on low sodium diet Reviewed Heart Failure Action Plan in depth and provided written copy Provided education about placing scale on hard, flat surface Advised patient to weigh each morning after emptying bladder Discussed importance of daily weight and advised patient to weigh and record daily Reviewed role of diuretics in prevention of fluid overload and management of heart failure; Discussed the importance of keeping all appointments with provider  Patient Self Care Activities:  follow rescue plan if symptoms flare-up Obtain scale  Patient received scale on last night  Plan:  Telephone follow up appointment with care management team member scheduled for:  09/13/23 @ 1100 am The patient has been provided with contact information for the care management team and has been advised to call with any health related questions or concerns.         Patient verbalizes understanding of instructions and care plan provided today and agrees to view in MyChart. Active MyChart status and patient understanding of how to access instructions and care plan via MyChart confirmed with  patient.     The patient has been provided with contact information for the care management team and has been advised to call with any health related questions or concerns.   Please call the care guide team at [phone] if you need to cancel or reschedule your appointment.   Please call the Suicide and Crisis Lifeline: 988 if you are experiencing a Mental Health or Behavioral Health Crisis or need someone to talk to.  Lakeria Starkman J. Henya Aguallo RN, MSN [facility], [facility] RN Care Manager Direct Dial: [phone]  Fax: [phone] Website: [email or web address]

## 2023-09-06 NOTE — Transitions of Care (Post Inpatient/ED Visit) (Signed)
 Transition of Care week 2  Visit Note  09/06/2023  Name: Nicole Gibbs MRN: [id number]          DOB: March 27, 1946  Situation: Patient enrolled in [facility] 30-day program. Visit completed with patient by telephone.   Background:   Initial Transition Care Management Follow-up Telephone Call    Past Medical History:  Diagnosis Date   Anxiety    Arthritis    knees   Chronic combined systolic and diastolic CHF (congestive heart failure) (HCC)    a. 07/2012 Echo: EF 55-60%, Gr1 DD;  b. 06/2015 Echo: EF 35-40%, triv AI, Mod MR, mod dil LA, mildly reduced RV.   Depression    DVT, bilateral lower limbs (HCC)    H/O cardiovascular stress test    a. 07/2012 MV: fixed mid-dist anterior and apical defect - scar vs attenuation, distal ant HK, no reversibility, EF 51%-->low risk.   Hx of blood clots    Hypertensive heart disease    Hypothyroidism    Joint pain    Nocturia    Normal coronary arteries    by cardiac catheterization 09/03/15   Obese    OSA on CPAP    Postmenopausal vaginal bleeding    Pulmonary embolism, bilateral (HCC)    a. chronic coumadin .   Rash    under breasts   Skin cancer    Sleep apnea    SOB (shortness of breath)    Swelling of both lower extremities    Thyroid  nodule    no meds    Assessment: Patient Reported Symptoms: Cognitive Cognitive Status: Alert and oriented to person, place, and time, Normal speech and language skills      Neurological Neurological Review of Symptoms: No symptoms reported    HEENT HEENT Symptoms Reported: No symptoms reported      Cardiovascular Cardiovascular Symptoms Reported: No symptoms reported Does patient have uncontrolled Hypertension?: No Cardiovascular Conditions: Dysrhythmia, Heart failure Cardiovascular Management Strategies: Weight management, Fluid modification, Diet modification, Medication therapy, Routine screening Do You Have a Working Readable Scale?: No (Patient ordered scale) Cardiovascular Comment: Reiterated  importance of weighing daily and when to notify physician  Respiratory Respiratory Symptoms Reported: No symptoms reported    Endocrine Patient reports the following symptoms related to hypoglycemia or hyperglycemia : No symptoms reported Is patient diabetic?: No    Gastrointestinal Gastrointestinal Symptoms Reported: No symptoms reported      Genitourinary Genitourinary Symptoms Reported: No symptoms reported    Integumentary Integumentary Symptoms Reported: No symptoms reported    Musculoskeletal Musculoskelatal Symptoms Reviewed: No symptoms reported        Psychosocial Psychosocial Symptoms Reported: No symptoms reported         There were no vitals filed for this visit.  Medications Reviewed Today     Reviewed by Ordean Fouts, RN (Case Manager) on 09/06/23 at 1120  Med List Status: <None>   Medication Order Taking? Sig Documenting Provider Last Dose Status Informant  acetaminophen  (TYLENOL ) 325 MG tablet [id number] Yes Take 650 mg by mouth every 6 (six) hours as needed for moderate pain. [provider] Taking Active Self, Pharmacy Records, Multiple Informants  alendronate (FOSAMAX) 70 MG tablet [id number] Yes Take 70 mg by mouth once a week. [provider] Taking Active Self, Pharmacy Records, Multiple Informants           Med Note Renata [place] Aug 28, 2023 12:51 PM) Due for dose  CALCIUM PO [id number] Yes Take 1 capsule by mouth daily at  12 noon. [provider] Taking Active Self, Pharmacy Records, Multiple Informants  Cholecalciferol (VITAMIN D3 PO) [id number] Yes Take 1 capsule by mouth daily at 12 noon. [provider] Taking Active Self, Pharmacy Records, Multiple Informants  furosemide  (LASIX ) 20 MG tablet [id number] Yes Take 3 tablets (60 mg total) by mouth daily. Haydee Lipa, MD Taking Active   medroxyPROGESTERone (PROVERA) 5 MG tablet [id number] Yes Take 5 mg by mouth daily in the afternoon. [provider] Taking Active Self, Pharmacy Records, Multiple Informants  metoprolol  tartrate (LOPRESSOR ) 25 MG tablet [id number] Yes Take 1 tablet (25 mg total) by mouth 2 (two) times daily. Haydee Lipa, MD Taking Active   nystatin  cream (MYCOSTATIN ) [id number] Yes Apply 1 application topically 2 (two) times daily.  Patient taking differently: Apply 1 application  topically daily as needed for dry skin.   Montey Apa, DO Taking Active Self, Pharmacy Records, Multiple Informants  spironolactone  (ALDACTONE ) 25 MG tablet [id number] Yes TAKE 1 TABLET(25 MG) BY MOUTH DAILY  Patient taking differently: Take 25 mg by mouth daily.   Avanell Leigh, MD Taking Active Self, Pharmacy Records, Multiple Informants  warfarin (COUMADIN ) 3 MG tablet [id number] Yes TAKE 1 TABLET BY MOUTH EVERY DAY AS DIRECTED BY COUMADIN  CLINIC  Patient taking differently: Take 3 mg by mouth daily at 4 PM.   Avanell Leigh, MD Taking Active Self, Pharmacy Records, Multiple Informants  zolpidem  (AMBIEN ) 5 MG tablet [id number] Yes Take 5 mg by mouth at bedtime. [provider] Taking Active Self, Pharmacy Records, Multiple Informants           Med Note Caroleen Churn, Porfirio Bristol A   Wed Oct 26, 2022  3:50 PM) Has used on a longterm basis, est 10 yrs.  5mg  dose, split dose QHS and on middle of the night after waking.  Behavioral health is not acutely concerned.  -- A Mitchum, PhD            Goals Addressed             This Visit's Progress    VBCI Transitions of Care (TOC) Care Plan       Problems:  Recent Hospitalization for treatment of Atrial Fibrillation and CHF SDOH barrier: Transportation  Goal:  Over the next 30 days, the patient will not experience hospital readmission  Interventions:   AFIB Interventions:   Reviewed importance of adherence to anticoagulant exactly as prescribed   Heart Failure Interventions: Basic overview and discussion of pathophysiology of Heart Failure  reviewed Provided education on low sodium diet Reviewed Heart Failure Action Plan in depth and provided written copy Provided education about placing scale on hard, flat surface Advised patient to weigh each morning after emptying bladder Discussed importance of daily weight and advised patient to weigh and record daily Reviewed role of diuretics in prevention of fluid overload and management of heart failure; Discussed the importance of keeping all appointments with provider  Patient Self Care Activities:  follow rescue plan if symptoms flare-up Obtain scale  Patient received scale on last night  Plan:  Telephone follow up appointment with care management team member scheduled for:  09/13/23 @ 1100 am The patient has been provided with contact information for the care management team and has been advised to call with any health related questions or concerns.          Recommendation:   Specialty provider follow-up Please schedule follow up Please schedule follow up with PCP Please weigh daily Follow  Up Plan:   Telephone follow-up in 1 week  Taylin Mans J. Waymon Laser RN, MSN [facility], [facility] RN Care Manager Direct Dial: [phone]  Fax: [phone] Website: [email or web address]

## 2023-09-06 NOTE — Progress Notes (Signed)
 Psychotherapy Progress Note [facility] Jodie Kendall, PhD LP  Patient ID: Nicole Gibbs (Znya)    MRN: [id number] Therapy format: Individual psychotherapy Date: 09/06/2023      Start: 1:08p     Stop: 1:58p     Time Spent: 50 min Location: In-person   Session narrative (presenting needs, interim history, self-report of stressors and symptoms, applications of prior therapy, status changes, and interventions made in session) Been in the hospital recently for A fib and CHF (4/14 acc to EHR).  Didn't drive for a while, until she admittedly missed her sugar and went to grocery yesterday.  Been working out salt-restricted diet measures.  No complaints about hospital, basically a pleasant time.  Concern she won't know how to get to cardiologist's office, which is relocating.  Knows she could get a ride from [facility] [place] but would rather drive herself.  Looked up the new location in session, showed how straightforward the route is, assured she can make the turns.  Now needs to buy a bathroom scale so she can monitor for fluid weight.  Getting flooded with choices looking online.  Says daughter has told her she's getting in too deep trying to make choices, and on review, she's right.  Recommended she try to just go for one with digital readout, wide enough to feel stable on it, and let one of her choices be good enough to just act on, without trying to game out $5 difference for hours.  Gently reminded that she has over $200 a month continuing to pay out for phantom health insurance premiums on her husband, now deceased well over 5 years, and her cost on that alone has been about $1200 since she first mentioned it -- saving a few dollars with multiple times the amount of time it would take to address that financial bleeding would seem to be gravely misplaced precision.  In fact, she has not followed through with [employer] to turn in proof of deceased customer.    Reflexively sidetracks onto hoping  she can get all that money back, but reminded that premiums are probably not refundable because she failed to notify, and it's something just to stop the bleeding right now.  Even if she doesn't get it back, if she just stops that much bleeding, she is soon offsetting the paper losses sustained in the stock market lately.  Unclear if she is resolved to act on that, but encouraged.  Therapeutic modalities: Cognitive Behavioral Therapy, Solution-Oriented/Positive Psychology, Environmental manager, and Motivational Interviewing  Mental Status/Observations:  Appearance:   Casual     Behavior:  Appropriate  Motor:  Normal and cane  Speech/Language:   Clear and Coherent  Affect:  Appropriate  Mood:  anxious  Thought process:  circumstantial and tangential  Thought content:    WNL and Obsessions  Sensory/Perceptual disturbances:    WNL  Orientation:  Fully oriented  Attention:  Good    Concentration:  Fair  Memory:  WNL  Insight:    Fair  Judgment:   Fair  Impulse Control:  Good   Risk Assessment: Danger to Self: No Self-injurious Behavior: No Danger to Others: No Physical Aggression / Violence: No Duty to Warn: No Access to Firearms a concern: No  Assessment of progress:  stabilized  Diagnosis:   ICD-10-CM   1. Generalized anxiety disorder  F41.1     2. Psychophysiological insomnia  F51.04     3. 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 Social isolation  Z60.4     5. Difficulty with community participation  Z60.9     6. Fear of falling (due to cataracts, weight/gait, and hx fall and fracture)  F40.298     7. r/o MCI  R69     8. Excessive carbohydrate intake  E67.8      Plan:  Clutter/life admin Mail collecting -- Still encourage to fetch her own mail and brave the curb, either with van or on foot.  OK to contract with a neighbor for now.  OK to pursue permission to get a house-mounted box on the basis of accessibility.  Try to process some of the unprocessed  each day or in focused stages if sustainable. Paper clutter -- Try to clear any reasonable amount of paper clutter at home -- glean checks, toss trash, collect things she needs to make more complicated decisions about and reserve for when she has the initiative Assisted paperwork and decluttering -- Continuing offer to open mail, triage paperwork, make life admin calls, or other tasks together in office if it can un-stick motivation to do on her own time.  May retain a helper, possibly, through neighbor or a Teacher, English as a foreign language.  Or a Production manager. Financial actions -- Make calls to settle expired benefit checks, stop outdated automatic payments, and cover any bills that may be neglected.  Resist pursuing illusions of tax errors in her own favor and pay off very small debt she has. Technical help -- Consider engaging a senior support service who might provide some technical help, material assistance, and/or companion  Side issues and righteous distractions -- Let go the phantom promise of $10K tax refund unless willing to ask D directly to recant her filing status and ask tax preparer to validate and work out an amended return at what would probably be a senseless cost.  Recognize any other quixotic ideas as attractive in principle but unfeasible in practice and a very real drain on the time and energy she needs more for other things.  Seek to recognize any 6-inch crosses (H's term) she can refuse to take up, conserving energy for true priorities. Social grievances ([name], primarily) Advise fact-check issues before deciding they need fighting Watch out for kitchen-sinking issues to the point where neither she nor the [name] can respond effectively -- divide and conquer Follow up on community advice and referrals to pursue her issues with [name] Open offer to call [name] from therapy to clarify and move the process along Strongly advise dropping hearsay concerns about other people who  several years ago allegedly saw or said something -- very distracting to hr focal needs, and pursuing the subjects would greatly harm her cas and credibility If determined to approach the city instead, recommend refining message and approaching an agency like [name] instead of city council Where political activism is motivating, feel free to engage with others Cognitive Continue to assess for MCI, signs of dementia May need permission to consult with daughter Zada for better understanding of her issues, limitations, and needs -- declined so far Health Continue to reconcile medical advice actively when needed Encourage attention to reducing carbs for general health and possible benefit to emotional regulation Other recommendations/advice -- As may be noted above.  Continue to utilize previously learned skills ad lib. Medication compliance -- Maintain medication as prescribed and work faithfully with relevant prescriber(s) if any changes are desired or seem indicated. Crisis service -- Aware of call list and work-in appts.  Call  the clinic on-call service, 988/hotline, 911, or present to [facility] or ER if any life-threatening psychiatric crisis. Followup -- No follow-ups on file.  Next scheduled visit with me 09/22/2023.  Next scheduled in this office 09/22/2023.  Lamar Kendall, PhD Jodie Kendall, PhD LP Clinical Psychologist, [facility] [address] [place], [place] [address] [phone]

## 2023-09-13 ENCOUNTER — Other Ambulatory Visit: Payer: Self-pay

## 2023-09-13 NOTE — Patient Instructions (Signed)
 Visit Information  Thank you for taking time to visit with me today. Please don't hesitate to contact me if I can be of assistance to you before our next scheduled telephone appointment.  Our next appointment is by telephone on 09/21/23 at 1100 am  Following is a copy of your care plan:   Goals Addressed             This Visit's Progress    VBCI Transitions of Care (TOC) Care Plan       Problems:  Recent Hospitalization for treatment of Atrial Fibrillation and CHF SDOH barrier: Transportation  Goal:  Over the next 30 days, the patient will not experience hospital readmission  Interventions:   AFIB Interventions:   Reviewed importance of adherence to anticoagulant exactly as prescribed   Heart Failure Interventions: Basic overview and discussion of pathophysiology of Heart Failure reviewed Provided education on low sodium diet Advised patient to weigh each morning after emptying bladder Discussed importance of daily weight and advised patient to weigh and record daily Reviewed role of diuretics in prevention of fluid overload and management of heart failure; Discussed the importance of keeping all appointments with provider  Patient Self Care Activities:  follow rescue plan if symptoms flare-up        Please begin to weight daily       Call PCP and Cardiology for follow up  Plan:  Telephone follow up appointment with care management team member scheduled for:  09/21/23 @ 1100 am The patient has been provided with contact information for the care management team and has been advised to call with any health related questions or concerns.         Patient verbalizes understanding of instructions and care plan provided today and agrees to view in MyChart. Active MyChart status and patient understanding of how to access instructions and care plan via MyChart confirmed with patient.     The patient has been provided with contact information for the care management team and has  been advised to call with any health related questions or concerns.   Please call the care guide team at [phone] if you need to cancel or reschedule your appointment.   Please call the Suicide and Crisis Lifeline: 988 if you are experiencing a Mental Health or Behavioral Health Crisis or need someone to talk to.  Brexton Sofia J. Aniya Jolicoeur RN, MSN [facility], [facility] RN Care Manager Direct Dial: [phone]  Fax: [phone] Website: [email or web address]

## 2023-09-13 NOTE — Transitions of Care (Post Inpatient/ED Visit) (Signed)
 Transition of Care week 3  Visit Note  09/13/2023  Name: Nicole Gibbs MRN: [id number]          DOB: Mar 30, 1946  Situation: Patient enrolled in [facility] 30-day program. Visit completed with patient by telephone.   Background:   Initial Transition Care Management Follow-up Telephone Call    Past Medical History:  Diagnosis Date   Anxiety    Arthritis    knees   Chronic combined systolic and diastolic CHF (congestive heart failure) (HCC)    a. 07/2012 Echo: EF 55-60%, Gr1 DD;  b. 06/2015 Echo: EF 35-40%, triv AI, Mod MR, mod dil LA, mildly reduced RV.   Depression    DVT, bilateral lower limbs (HCC)    H/O cardiovascular stress test    a. 07/2012 MV: fixed mid-dist anterior and apical defect - scar vs attenuation, distal ant HK, no reversibility, EF 51%-->low risk.   Hx of blood clots    Hypertensive heart disease    Hypothyroidism    Joint pain    Nocturia    Normal coronary arteries    by cardiac catheterization 09/03/15   Obese    OSA on CPAP    Postmenopausal vaginal bleeding    Pulmonary embolism, bilateral (HCC)    a. chronic coumadin .   Rash    under breasts   Skin cancer    Sleep apnea    SOB (shortness of breath)    Swelling of both lower extremities    Thyroid  nodule    no meds    Assessment: Patient Reported Symptoms: Cognitive Cognitive Status: Alert and oriented to person, place, and time, Normal speech and language skills      Neurological Neurological Review of Symptoms: No symptoms reported    HEENT HEENT Symptoms Reported: No symptoms reported      Cardiovascular Cardiovascular Symptoms Reported: No symptoms reported Cardiovascular Conditions: Dysrhythmia, Heart failure Cardiovascular Management Strategies: Weight management, Medication therapy, Diet modification, Routine screening Do You Have a Working Readable Scale?: Yes Weight:  (Patient has not weighed. Stressed importance of daily weights) Cardiovascular Self-Management Outcome: 4  (good) Cardiovascular Comment: Stressed importance of weights and when to notify physician  Respiratory Respiratory Symptoms Reported: No symptoms reported    Endocrine Patient reports the following symptoms related to hypoglycemia or hyperglycemia : No symptoms reported    Gastrointestinal Gastrointestinal Symptoms Reported: No symptoms reported      Genitourinary   Genitourinary Conditions: Other Other Genitourinary Conditions: Reports some vaginal bleeding-Advised patient to follow up with PCP Genitourinary Self-Management Outcome: 3 (uncertain)  Integumentary Integumentary Symptoms Reported: No symptoms reported    Musculoskeletal Musculoskelatal Symptoms Reviewed: Difficulty walking Additional Musculoskeletal Details: Uses cane with ambulation Musculoskeletal Self-Management Outcome: 4 (good) Falls in the past year?: No    Psychosocial Psychosocial Symptoms Reported: No symptoms reported         There were no vitals filed for this visit.  Medications Reviewed Today     Reviewed by Roshaunda Starkey, RN (Case Manager) on 09/13/23 at 1110  Med List Status: <None>   Medication Order Taking? Sig Documenting Provider Last Dose Status Informant  acetaminophen  (TYLENOL ) 325 MG tablet [id number] Yes Take 650 mg by mouth every 6 (six) hours as needed for moderate pain. [provider] Taking Active Self, Pharmacy Records, Multiple Informants  alendronate (FOSAMAX) 70 MG tablet [id number] Yes Take 70 mg by mouth once a week. [provider] Taking Active Self, Pharmacy Records, Multiple Informants  Med Note Alline Ivans   Mon Aug 28, 2023 12:51 PM) Due for dose  CALCIUM PO [id number] Yes Take 1 capsule by mouth daily at 12 noon. [provider] Taking Active Self, Pharmacy Records, Multiple Informants  Cholecalciferol (VITAMIN D3 PO) [id number] Yes Take 1 capsule by mouth daily at 12 noon. [provider] Taking Active Self, Pharmacy  Records, Multiple Informants  furosemide  (LASIX ) 20 MG tablet [id number] Yes Take 3 tablets (60 mg total) by mouth daily. Haydee Lipa, MD Taking Active   medroxyPROGESTERone (PROVERA) 5 MG tablet [id number] Yes Take 5 mg by mouth daily in the afternoon. [provider] Taking Active Self, Pharmacy Records, Multiple Informants  metoprolol  tartrate (LOPRESSOR ) 25 MG tablet [id number] Yes Take 1 tablet (25 mg total) by mouth 2 (two) times daily. Haydee Lipa, MD Taking Active   nystatin  cream (MYCOSTATIN ) [id number] Yes Apply 1 application topically 2 (two) times daily.  Patient taking differently: Apply 1 application  topically daily as needed for dry skin.   Montey Apa, DO Taking Active Self, Pharmacy Records, Multiple Informants  spironolactone  (ALDACTONE ) 25 MG tablet [id number] Yes TAKE 1 TABLET(25 MG) BY MOUTH DAILY  Patient taking differently: Take 25 mg by mouth daily.   Avanell Leigh, MD Taking Active Self, Pharmacy Records, Multiple Informants  warfarin (COUMADIN ) 3 MG tablet [id number] Yes TAKE 1 TABLET BY MOUTH EVERY DAY AS DIRECTED BY COUMADIN  CLINIC  Patient taking differently: Take 3 mg by mouth daily at 4 PM.   Avanell Leigh, MD Taking Active Self, Pharmacy Records, Multiple Informants  zolpidem  (AMBIEN ) 5 MG tablet [id number] Yes Take 5 mg by mouth at bedtime. [provider] Taking Active Self, Pharmacy Records, Multiple Informants           Med Note Caroleen Churn, Porfirio Bristol A   Wed Oct 26, 2022  3:50 PM) Has used on a longterm basis, est 10 yrs.  5mg  dose, split dose QHS and on middle of the night after waking.  Behavioral health is not acutely concerned.  -- A Mitchum, PhD            Goals Addressed             This Visit's Progress    VBCI Transitions of Care (TOC) Care Plan       Problems:  Recent Hospitalization for treatment of Atrial Fibrillation and CHF SDOH barrier: Transportation  Goal:  Over the next 30 days, the patient  will not experience hospital readmission  Interventions:   AFIB Interventions:   Reviewed importance of adherence to anticoagulant exactly as prescribed   Heart Failure Interventions: Basic overview and discussion of pathophysiology of Heart Failure reviewed Provided education on low sodium diet Advised patient to weigh each morning after emptying bladder Discussed importance of daily weight and advised patient to weigh and record daily Reviewed role of diuretics in prevention of fluid overload and management of heart failure; Discussed the importance of keeping all appointments with provider  Patient Self Care Activities:  follow rescue plan if symptoms flare-up        Please begin to weight daily       Call PCP and Cardiology for follow up  Plan:  Telephone follow up appointment with care management team member scheduled for:  09/21/23 @ 1100 am The patient has been provided with contact information for the care management team and has been advised to call with any health related questions or concerns.  Recommendation:   PCP Follow-up Specialty provider follow-up Please schedule with cardiology  Follow Up Plan:   Telephone follow-up in 1 week   Davione Lenker J. Debralee Braaksma RN, MSN [facility], [facility] RN Care Manager Direct Dial: [phone]  Fax: [phone] Website: [email or web address]

## 2023-09-18 ENCOUNTER — Encounter

## 2023-09-19 ENCOUNTER — Ambulatory Visit: Admitting: Emergency Medicine

## 2023-09-21 ENCOUNTER — Other Ambulatory Visit: Payer: Self-pay

## 2023-09-21 NOTE — Patient Instructions (Signed)
 Visit Information  Thank you for taking time to visit with me today. Please don't hesitate to contact me if I can be of assistance to you before our next scheduled telephone appointment.  Our next appointment is by telephone on 09/28/23 at 1100 am  Following is a copy of your care plan:   Goals Addressed             This Visit's Progress    VBCI Transitions of Care (TOC) Care Plan       Problems:  Recent Hospitalization for treatment of Atrial Fibrillation and CHF SDOH barrier: Transportation  Goal:  Over the next 30 days, the patient will not experience hospital readmission  Interventions:   AFIB Interventions:   Reviewed importance of adherence to anticoagulant exactly as prescribed   Heart Failure Interventions: Basic overview and discussion of pathophysiology of Heart Failure reviewed Provided education on low sodium diet Advised patient to weigh each morning after emptying bladder Discussed importance of daily weight and advised patient to weigh and record daily Reviewed role of diuretics in prevention of fluid overload and management of heart failure; Discussed the importance of keeping all appointments with provider  Patient Self Care Activities:  track weight in diary weigh myself daily follow rescue plan if symptoms flare-up              Call PCP and Cardiology for follow up  Plan:  Telephone follow up appointment with care management team member scheduled for:  09/28/23 @ 1100 am The patient has been provided with contact information for the care management team and has been advised to call with any health related questions or concerns.         Patient verbalizes understanding of instructions and care plan provided today and agrees to view in MyChart. Active MyChart status and patient understanding of how to access instructions and care plan via MyChart confirmed with patient.     The patient has been provided with contact information for the care  management team and has been advised to call with any health related questions or concerns.   Please call the care guide team at [phone] if you need to cancel or reschedule your appointment.   Please call the Suicide and Crisis Lifeline: 988 if you are experiencing a Mental Health or Behavioral Health Crisis or need someone to talk to.  Rashanna Christiana J. Denaisha Swango RN, MSN [facility], [facility] RN Care Manager Direct Dial: [phone]  Fax: [phone] Website: [email or web address]

## 2023-09-21 NOTE — Transitions of Care (Post Inpatient/ED Visit) (Signed)
 Transition of Care week 4  Visit Note  09/21/2023  Name: Nicole Gibbs MRN: [id number]          DOB: [birth year]  Situation: Patient enrolled in [facility] 30-day program. Visit completed with patient by telephone.   Background:   Initial Transition Care Management Follow-up Telephone Call    Past Medical History:  Diagnosis Date   Anxiety    Arthritis    knees   Chronic combined systolic and diastolic CHF (congestive heart failure) (HCC)    a. 07/2012 Echo: EF 55-60%, Gr1 DD;  b. 06/2015 Echo: EF 35-40%, triv AI, Mod MR, mod dil LA, mildly reduced RV.   Depression    DVT, bilateral lower limbs (HCC)    H/O cardiovascular stress test    a. 07/2012 MV: fixed mid-dist anterior and apical defect - scar vs attenuation, distal ant HK, no reversibility, EF 51%-->low risk.   Hx of blood clots    Hypertensive heart disease    Hypothyroidism    Joint pain    Nocturia    Normal coronary arteries    by cardiac catheterization 09/03/15   Obese    OSA on CPAP    Postmenopausal vaginal bleeding    Pulmonary embolism, bilateral (HCC)    a. chronic coumadin .   Rash    under breasts   Skin cancer    Sleep apnea    SOB (shortness of breath)    Swelling of both lower extremities    Thyroid  nodule    no meds    Assessment: Patient Reported Symptoms: Cognitive Cognitive Status: Able to follow simple commands, Normal speech and language skills      Neurological Neurological Review of Symptoms: No symptoms reported    HEENT HEENT Symptoms Reported: No symptoms reported      Cardiovascular Cardiovascular Symptoms Reported: No symptoms reported Do You Have a Working Readable Scale?: Yes Weight: 216 lb (98 kg) Cardiovascular Self-Management Outcome: 4 (good)  Respiratory Respiratory Symptoms Reported: No symptoms reported    Endocrine Patient reports the following symptoms related to hypoglycemia or hyperglycemia : No symptoms reported    Gastrointestinal Gastrointestinal Symptoms Reported:  No symptoms reported      Genitourinary Genitourinary Symptoms Reported: No symptoms reported    Integumentary Integumentary Symptoms Reported: No symptoms reported    Musculoskeletal Musculoskelatal Symptoms Reviewed: Difficulty walking Additional Musculoskeletal Details: Uses cane for ambulation Musculoskeletal Management Strategies: Exercise, Activity Musculoskeletal Self-Management Outcome: 4 (good)      Psychosocial Psychosocial Symptoms Reported: No symptoms reported         There were no vitals filed for this visit.  Medications Reviewed Today     Reviewed by Deana Krock, RN (Case Manager) on 09/21/23 at 1123  Med List Status: <None>   Medication Order Taking? Sig Documenting Provider Last Dose Status Informant  acetaminophen  (TYLENOL ) 325 MG tablet [id number] Yes Take 650 mg by mouth every 6 (six) hours as needed for moderate pain. [provider] Taking Active Self, Pharmacy Records, Multiple Informants  alendronate (FOSAMAX) 70 MG tablet [id number] Yes Take 70 mg by mouth once a week. [provider] Taking Active Self, Pharmacy Records, Multiple Informants           Med Note Renata Morehouse Aug 28, 2023 12:51 PM) Due for dose  CALCIUM PO [id number] Yes Take 1 capsule by mouth daily at 12 noon. [provider] Taking Active Self, Pharmacy Records, Multiple Informants  Cholecalciferol (VITAMIN D3 PO) [id number] Yes Take 1 capsule  by mouth daily at 12 noon. [provider] Taking Active Self, Pharmacy Records, Multiple Informants  furosemide  (LASIX ) 20 MG tablet [id number] Yes Take 3 tablets (60 mg total) by mouth daily. Haydee Lipa, MD Taking Active   medroxyPROGESTERone (PROVERA) 5 MG tablet [id number] Yes Take 5 mg by mouth daily in the afternoon. [provider] Taking Active Self, Pharmacy Records, Multiple Informants  metoprolol  tartrate (LOPRESSOR ) 25 MG tablet [id number] Yes Take 1 tablet (25 mg total) by  mouth 2 (two) times daily. Haydee Lipa, MD Taking Active   nystatin  cream (MYCOSTATIN ) [id number] Yes Apply 1 application topically 2 (two) times daily.  Patient taking differently: Apply 1 application  topically daily as needed for dry skin.   Montey Apa, DO Taking Active Self, Pharmacy Records, Multiple Informants  spironolactone  (ALDACTONE ) 25 MG tablet [id number] Yes TAKE 1 TABLET(25 MG) BY MOUTH DAILY  Patient taking differently: Take 25 mg by mouth daily.   Avanell Leigh, MD Taking Active Self, Pharmacy Records, Multiple Informants  warfarin (COUMADIN ) 3 MG tablet [id number] Yes TAKE 1 TABLET BY MOUTH EVERY DAY AS DIRECTED BY COUMADIN  CLINIC  Patient taking differently: Take 3 mg by mouth daily at 4 PM.   Avanell Leigh, MD Taking Active Self, Pharmacy Records, Multiple Informants  zolpidem  (AMBIEN ) 5 MG tablet [id number] Yes Take 5 mg by mouth at bedtime. [provider] Taking Active Self, Pharmacy Records, Multiple Informants           Med Note Caroleen Churn, Porfirio Bristol A   Wed Oct 26, 2022  3:50 PM) Has used on a longterm basis, est 10 yrs.  5mg  dose, split dose QHS and on middle of the night after waking.  Behavioral health is not acutely concerned.  -- Audery Blazing, PhD           Patient reports feeling stronger. She is weighing daily now. Recent weight 216 lbs. Discussed weight and parameters along with low salt diet.  Reviewed lasix  and dosage and importance.  Patient to make cardiology follow up appointment.    Recommendation:   Specialty provider follow-up Schedule cardiology appointment  Follow Up Plan:   Telephone follow-up in 1 week  Jusitn Salsgiver Weldon Hales RN, MSN [facility], [facility] RN Care Manager Direct Dial: [phone]  Fax: [phone] Website: [email or web address]

## 2023-09-22 ENCOUNTER — Ambulatory Visit (INDEPENDENT_AMBULATORY_CARE_PROVIDER_SITE_OTHER): Payer: Medicare Other | Admitting: Psychiatry

## 2023-09-22 DIAGNOSIS — F423 Hoarding disorder: Secondary | ICD-10-CM | POA: Diagnosis not present

## 2023-09-22 DIAGNOSIS — E678 Other specified hyperalimentation: Secondary | ICD-10-CM

## 2023-09-22 DIAGNOSIS — F5104 Psychophysiologic insomnia: Secondary | ICD-10-CM | POA: Diagnosis not present

## 2023-09-22 DIAGNOSIS — F411 Generalized anxiety disorder: Secondary | ICD-10-CM | POA: Diagnosis not present

## 2023-09-22 DIAGNOSIS — F40298 Other specified phobia: Secondary | ICD-10-CM | POA: Diagnosis not present

## 2023-09-22 DIAGNOSIS — R69 Illness, unspecified: Secondary | ICD-10-CM

## 2023-09-22 NOTE — Progress Notes (Signed)
 Psychotherapy Progress Note [facility] Jodie Kendall, PhD LP  Patient ID: Nicole Gibbs ([name])    MRN: [id number] Therapy format: Individual psychotherapy Date: 09/22/2023      Start: 3:05p     Stop: 3:50p     Time Spent: 45 min Location: In-person   Session narrative (presenting needs, interim history, self-report of stressors and symptoms, applications of prior therapy, status changes, and interventions made in session) Concern for [name], sick the last couple weeks (in [place]) with sinus trouble, a hip problem, and a hx of steroids used until ineffective.  Did get her new bathroom scales, got them started successfully, only knows how to weigh so far but is getting readings to go by so she can flag CHF.  Wants to learn how to take her heart rate.  Referred back to documentation and/or nurse   Concern for [name] wanting to travel in June, but [place] is her airport, and recently notorious in the news.  Encouraged to trust they are working aggressively to resolve problems in the wake of publicity, could be one of the safest places under scrutiny.  Her mail fetching arrangement with neighbor(s) is still working out, and she is now regularly reviewing mail to see if there's anything important.  Says it still piles up without action taken, often enough, but better.  Has not taken any actions yet to recover outdated checks, or to get [name] death reported to his health insurance 6 years on, or to pay off a very small delinquent tax bill.  Reviewed her motivations, offering that she has at least 3 chronic reasons to procrastinate -- fear of feeling grief, imagined ideas it's going to get complicated and wear her out or get her panicked, and her own compulsion to add claims of injustice or back pay deserved to issues in which she could just act, or speak up to inform her audience.  Encouraged focus and working through health care self-help tasks.  Therapeutic modalities: Cognitive Behavioral  Therapy, Solution-Oriented/Positive Psychology, and Ego-Supportive  Mental Status/Observations:  Appearance:   Casual     Behavior:  Appropriate  Motor:  Normal and cane  Speech/Language:   Clear and Coherent  Affect:  Appropriate  Mood:  anxious  Thought process:  flight of ideas  Thought content:    Rumination  Sensory/Perceptual disturbances:    WNL  Orientation:  Fully oriented  Attention:  Good    Concentration:  Fair  Memory:  WNL  Insight:    Fair  Judgment:   Fair  Impulse Control:  Good   Risk Assessment: Danger to Self: No Self-injurious Behavior: No Danger to Others: No Physical Aggression / Violence: No Duty to Warn: No Access to Firearms a concern: No  Assessment of progress:  stabilized  Diagnosis:   ICD-10-CM   1. Generalized anxiety disorder  F41.1     2. 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 Fear of falling (due to cataracts, weight/gait, and hx fall and fracture)  F40.298     4. Psychophysiological insomnia  F51.04     5. r/o MCI  R69     6. Excessive carbohydrate intake  E67.8      Plan:  Clutter/life admin Mail collecting -- Still encourage to fetch her own mail and brave the curb, either with van or on foot.  OK to contract with a neighbor for now.  OK to pursue permission to get a house-mounted box on the basis  of accessibility.  Try to process some of the unprocessed each day or in focused stages if sustainable. Paper clutter -- Try to clear any reasonable amount of paper clutter at home -- glean checks, toss trash, collect things she needs to make more complicated decisions about and reserve for when she has the initiative Assisted paperwork and decluttering -- Continuing offer to open mail, triage paperwork, make life admin calls, or other tasks together in office if it can un-stick motivation to do on her own time.  May retain a helper, possibly, through neighbor or a Teacher, English as a foreign language.  Or a Camera operator. Financial actions -- Make calls to settle expired benefit checks, stop outdated automatic payments, and cover any bills that may be neglected.  Resist pursuing illusions of tax errors in her own favor and pay off very small debt she has. Technical help -- Consider engaging a senior support service who might provide some technical help, material assistance, and/or companion  Side issues and righteous distractions -- Let go the phantom promise of $10K tax refund unless willing to ask D directly to recant her filing status and ask tax preparer to validate and work out an amended return at what would probably be a senseless cost.  Recognize any other quixotic ideas as attractive in principle but unfeasible in practice and a very real drain on the time and energy she needs more for other things.  Seek to recognize any 6-inch crosses (H's term) she can refuse to take up, conserving energy for true priorities. Social grievances ([name], primarily) Advise fact-check issues before deciding they need fighting Watch out for kitchen-sinking issues to the point where neither she nor the [name] can respond effectively -- divide and conquer Follow up on community advice and referrals to pursue her issues with [name] Open offer to call [name] from therapy to clarify and move the process along Strongly advise dropping hearsay concerns about other people who several years ago allegedly saw or said something -- very distracting to hr focal needs, and pursuing the subjects would greatly harm her cas and credibility If determined to approach the city instead, recommend refining message and approaching an agency like [name] instead of city council Where political activism is motivating, feel free to engage with others Cognitive Continue to assess for MCI, signs of dementia May need permission to consult with daughter Zada for better understanding of her issues, limitations, and needs -- declined so far Health Continue to  reconcile medical advice actively when needed Encourage attention to reducing carbs for general health and possible benefit to emotional regulation Other recommendations/advice -- As may be noted above.  Continue to utilize previously learned skills ad lib. Medication compliance -- Maintain medication as prescribed and work faithfully with relevant prescriber(s) if any changes are desired or seem indicated. Crisis service -- Aware of call list and work-in appts.  Call the clinic on-call service, 988/hotline, 911, or present to [facility] or ER if any life-threatening psychiatric crisis. Followup -- Return for time as already scheduled.  Next scheduled visit with me 10/03/2023.  Next scheduled in this office 10/03/2023.  Addendum 6/29: Appointments cancelled for 5/20, 6/2, 6/16, and 6/30 close but in time.  NO indication of lost motivation, appears to be competition for time with other needs.  Per EHR, she is in CCM followup for cardiovascular issues.  Lamar Kendall, PhD Jodie Kendall, PhD LP Clinical Psychologist, [facility] [address] [place],  [place] [address] (o) [phone]

## 2023-09-28 ENCOUNTER — Other Ambulatory Visit: Payer: Self-pay

## 2023-09-28 VITALS — Wt 217.1 lb

## 2023-09-28 DIAGNOSIS — I5042 Chronic combined systolic (congestive) and diastolic (congestive) heart failure: Secondary | ICD-10-CM

## 2023-09-28 NOTE — Transitions of Care (Post Inpatient/ED Visit) (Signed)
 Transition of Care Final   Visit Note  09/28/2023  Name: Nicole Gibbs MRN: [id number]          DOB: 04/24/1946  Situation: Patient enrolled in [facility] 30-day program. Visit completed with patient by telephone.   Background:   Initial Transition Care Management Follow-up Telephone Call    Past Medical History:  Diagnosis Date   Anxiety    Arthritis    knees   Chronic combined systolic and diastolic CHF (congestive heart failure) (HCC)    a. 07/2012 Echo: EF 55-60%, Gr1 DD;  b. 06/2015 Echo: EF 35-40%, triv AI, Mod MR, mod dil LA, mildly reduced RV.   Depression    DVT, bilateral lower limbs (HCC)    H/O cardiovascular stress test    a. 07/2012 MV: fixed mid-dist anterior and apical defect - scar vs attenuation, distal ant HK, no reversibility, EF 51%-->low risk.   Hx of blood clots    Hypertensive heart disease    Hypothyroidism    Joint pain    Nocturia    Normal coronary arteries    by cardiac catheterization 09/03/15   Obese    OSA on CPAP    Postmenopausal vaginal bleeding    Pulmonary embolism, bilateral (HCC)    a. chronic coumadin .   Rash    under breasts   Skin cancer    Sleep apnea    SOB (shortness of breath)    Swelling of both lower extremities    Thyroid  nodule    no meds    Assessment: Patient Reported Symptoms: Cognitive Cognitive Status: Alert and oriented to person, place, and time, Normal speech and language skills      Neurological Neurological Review of Symptoms: No symptoms reported    HEENT HEENT Symptoms Reported: No symptoms reported      Cardiovascular Cardiovascular Symptoms Reported: No symptoms reported Cardiovascular Conditions: Dysrhythmia, Heart failure Cardiovascular Management Strategies: Medication therapy, Weight management Do You Have a Working Readable Scale?: Yes Weight: 217 lb 1.6 oz (98.5 kg) Cardiovascular Self-Management Outcome: 4 (good) Cardiovascular Comment: Patient has not seen cardiology since hospitalization.  Patient has been encouraged to schedule appointment. Appointment scheduled for 10-31-23  Respiratory Respiratory Symptoms Reported: No symptoms reported    Endocrine Patient reports the following symptoms related to hypoglycemia or hyperglycemia : No symptoms reported    Gastrointestinal Gastrointestinal Symptoms Reported: No symptoms reported      Genitourinary Genitourinary Symptoms Reported: No symptoms reported    Integumentary Integumentary Symptoms Reported: No symptoms reported    Musculoskeletal Musculoskelatal Symptoms Reviewed: Difficulty walking Additional Musculoskeletal Details: Uses cane for ambulation Musculoskeletal Management Strategies: Activity Musculoskeletal Self-Management Outcome: 4 (good) Falls in the past year?: No    Psychosocial Psychosocial Symptoms Reported: No symptoms reported         There were no vitals filed for this visit.  Medications Reviewed Today     Reviewed by Kainalu Heggs, RN (Case Manager) on 09/28/23 at 1133  Med List Status: <None>   Medication Order Taking? Sig Documenting Provider Last Dose Status Informant  acetaminophen  (TYLENOL ) 325 MG tablet [id number] Yes Take 650 mg by mouth every 6 (six) hours as needed for moderate pain. [provider] Taking Active Self, Pharmacy Records, Multiple Informants  alendronate (FOSAMAX) 70 MG tablet [id number] Yes Take 70 mg by mouth once a week. [provider] Taking Active Self, Pharmacy Records, Multiple Informants           Med Note Baltazar Leventhal, [place]   Mon Aug 28, 2023 12:51 PM) Due for dose  CALCIUM PO [id number] Yes Take 1 capsule by mouth daily at 12 noon. [provider] Taking Active Self, Pharmacy Records, Multiple Informants  Cholecalciferol (VITAMIN D3 PO) [id number] Yes Take 1 capsule by mouth daily at 12 noon. [provider] Taking Active Self, Pharmacy Records, Multiple Informants  furosemide  (LASIX ) 20 MG tablet [id number] Yes Take 3 tablets (60  mg total) by mouth daily. Haydee Lipa, MD Taking Active   medroxyPROGESTERone (PROVERA) 5 MG tablet [id number] Yes Take 5 mg by mouth daily in the afternoon. [provider] Taking Active Self, Pharmacy Records, Multiple Informants  metoprolol  tartrate (LOPRESSOR ) 25 MG tablet [id number] Yes Take 1 tablet (25 mg total) by mouth 2 (two) times daily. Haydee Lipa, MD Taking Active   nystatin  cream (MYCOSTATIN ) [id number] Yes Apply 1 application topically 2 (two) times daily.  Patient taking differently: Apply 1 application  topically daily as needed for dry skin.   Montey Apa, DO Taking Active Self, Pharmacy Records, Multiple Informants  spironolactone  (ALDACTONE ) 25 MG tablet [id number] Yes TAKE 1 TABLET(25 MG) BY MOUTH DAILY  Patient taking differently: Take 25 mg by mouth daily.   Avanell Leigh, MD Taking Active Self, Pharmacy Records, Multiple Informants  warfarin (COUMADIN ) 3 MG tablet [id number] Yes TAKE 1 TABLET BY MOUTH EVERY DAY AS DIRECTED BY COUMADIN  CLINIC  Patient taking differently: Take 3 mg by mouth daily at 4 PM.   Avanell Leigh, MD Taking Active Self, Pharmacy Records, Multiple Informants  zolpidem  (AMBIEN ) 5 MG tablet [id number] Yes Take 5 mg by mouth at bedtime. [provider] Taking Active Self, Pharmacy Records, Multiple Informants           Med Note Caroleen Churn, Porfirio Bristol A   Wed Oct 26, 2022  3:50 PM) Has used on a longterm basis, est 10 yrs.  5mg  dose, split dose QHS and on middle of the night after waking.  Behavioral health is not acutely concerned.  -- Audery Blazing, PhD            Recommendation:   PCP Follow-up-patient still has not seen PCP for follow up. Encouraged to follow up from recent hospitalization Cardiology follow up 10/31/23   Follow Up Plan:   Referral to RN Case Manager Closing From:  Transitions of Care Program   Halah Whiteside Weldon Hales RN, MSN [place]  [facility], [facility] RN Care  Manager Direct Dial: [phone]  Fax: [phone] Website: [email or web address]

## 2023-09-28 NOTE — Patient Instructions (Signed)
 Visit Information  Thank you for taking time to visit with me today. Please don't hesitate to contact me if I can be of assistance to you before our next scheduled telephone appointment.    Following is a copy of your care plan:   Goals Addressed             This Visit's Progress    COMPLETED: VBCI Transitions of Care (TOC) Care Plan       Problems:  Recent Hospitalization for treatment of Atrial Fibrillation and CHF   Goal:  Over the next 30 days, the patient will not experience hospital readmission  Interventions:   AFIB Interventions:   Reviewed importance of adherence to anticoagulant exactly as prescribed   Heart Failure Interventions: Basic overview and discussion of pathophysiology of Heart Failure reviewed Provided education on low sodium diet Advised patient to weigh each morning after emptying bladder Discussed importance of daily weight and advised patient to weigh and record daily Reviewed role of diuretics in prevention of fluid overload and management of heart failure; Discussed the importance of keeping all appointments with provider Referral completed for Complex care management  Patient Self Care Activities:  track weight in diary weigh myself daily follow rescue plan if symptoms flare-up              Call PCP and Cardiology for follow up  Plan: Transfer patient to Complex nurse for continued management The patient has been provided with contact information for the care management team and has been advised to call with any health related questions or concerns.         Patient verbalizes understanding of instructions and care plan provided today and agrees to view in MyChart. Active MyChart status and patient understanding of how to access instructions and care plan via MyChart confirmed with patient.     The patient has been provided with contact information for the care management team and has been advised to call with any health related questions  or concerns.   Please call the care guide team at [phone] if you need to cancel or reschedule your appointment.   Please call the Suicide and Crisis Lifeline: 988 if you are experiencing a Mental Health or Behavioral Health Crisis or need someone to talk to.   Markee Remlinger J. Saia Derossett RN, MSN [facility], [facility] RN Care Manager Direct Dial: [phone]  Fax: [phone] Website: [email or web address]

## 2023-10-03 ENCOUNTER — Ambulatory Visit: Admitting: Psychiatry

## 2023-10-03 ENCOUNTER — Telehealth: Payer: Self-pay

## 2023-10-03 NOTE — Progress Notes (Signed)
 Complex Care Management Note  Care Guide Note 10/03/2023 Name: Nicole Gibbs MRN: [id number] DOB: 1945/11/13  Nicole Gibbs is a 78 y.o. year old female who sees Janan Mead, Arlice Lai, FNP for primary care. I reached out to Caci Graddy by phone today to offer complex care management services.  Ms. Jamroz was given information about Complex Care Management services today including:   The Complex Care Management services include support from the care team which includes your Nurse Care Manager, Clinical Social Worker, or Pharmacist.  The Complex Care Management team is here to help remove barriers to the health concerns and goals most important to you. Complex Care Management services are voluntary, and the patient may decline or stop services at any time by request to their care team member.   Complex Care Management Consent Status: Patient agreed to services and verbal consent obtained.   Follow up plan:  Telephone appointment with complex care management team member scheduled for:  10/17/23 at 1:00 p.m.  Encounter Outcome:  Patient Scheduled  [facility]  [facility], [facility] Care Management Assistant Direct Dial: [phone]  Fax: [phone]

## 2023-10-12 ENCOUNTER — Telehealth: Payer: Self-pay | Admitting: Cardiovascular Disease

## 2023-10-12 MED ORDER — FUROSEMIDE 20 MG PO TABS: 60.0000 mg | ORAL_TABLET | Freq: Every day | ORAL | 1 refills | Status: DC

## 2023-10-12 MED ORDER — METOPROLOL TARTRATE 25 MG PO TABS: 25.0000 mg | ORAL_TABLET | Freq: Two times a day (BID) | ORAL | 0 refills | Status: DC

## 2023-10-12 NOTE — Telephone Encounter (Signed)
 RX sent to requested Pharmacy

## 2023-10-12 NOTE — Telephone Encounter (Signed)
*  STAT* If patient is at the pharmacy, call can be transferred to refill team.   1. Which medications need to be refilled? (please list name of each medication and dose if known)   furosemide  (LASIX ) 20 MG tablet (Expired)   metoprolol  tartrate (LOPRESSOR ) 25 MG tablet    2. Would you like to learn more about the convenience, safety, & potential cost savings by using the [facility]?    3. Are you open to using the [facility] (Type [facility].  ).   4. Which pharmacy/location (including street and city if local pharmacy) is medication to be sent to?  [id number] - [place], [place] - [address] AT [facility] [address]       5. Do they need a 30 day or 90 day supply? 90 day

## 2023-10-16 ENCOUNTER — Ambulatory Visit: Admitting: Psychiatry

## 2023-10-17 ENCOUNTER — Other Ambulatory Visit: Payer: Self-pay

## 2023-10-17 NOTE — Patient Instructions (Signed)
 Visit Information  Thank you for taking time to visit with me today. Please don't hesitate to contact me if I can be of assistance to you before our next scheduled appointment.  Our next appointment is by telephone on Friday, June 20th at 1:00pm  Please call the care guide team at [phone] if you need to cancel or reschedule your appointment.   Following is a copy of your care plan:   Goals Addressed             This Visit's Progress    VBCI RN Care Plan       Problems:  Chronic Disease Management support and education needs related to Atrial Fibrillation and CHF  Goal: Over the next 60 days the Patient will demonstrate Improved adherence to prescribed treatment plan for Atrial Fibrillation and CHF as evidenced by no ED or Hospitalizations for fluid overload. ,   Interventions:   AFIB Interventions:   Reviewed importance of adherence to anticoagulant exactly as prescribed Counseled on avoidance of NSAIDs due to increased bleeding risk with anticoagulants Counseled on importance of regular laboratory monitoring as prescribed Screening for signs and symptoms of depression related to chronic disease state  Assessed social determinant of health barriers   Heart Failure Interventions: Discussed importance of daily weight and advised patient to weigh and record daily Reviewed role of diuretics in prevention of fluid overload and management of heart failure; Discussed the importance of keeping all appointments with provider Educated on donning compression stockings daily to reduce edema in ankles, taking stockings off at night, elevated legs if sitting for long periods of time.   Patient Self-Care Activities:  Attend all scheduled provider appointments Call pharmacy for medication refills 3-7 days in advance of running out of medications Call provider office for new concerns or questions  Take medications as prescribed   Attend Cardiology appointment on 10/31/23 to discuss  furosemide  dose of 60mg  (she was on 40mg  before hospitalization), needs current PT/INR, renal labs.   Plan:  Telephone follow up appointment with care management team member scheduled for:  11/03/23 at 1:00pm.              A reminder to ALL patients/family/friends, please call the [place]  National Suicide Prevention Lifeline: 289-285-4108 or TTY: 856-771-1734 TTY [phone]) to talk to a trained counselor if you are experiencing a Mental Health or Behavioral Health Crisis or need someone to talk to.  Patient verbalizes understanding of instructions and care plan provided today and agrees to view in MyChart. Active MyChart status and patient understanding of how to access instructions and care plan via MyChart confirmed with patient.     Jurline Olmsted BSN, CCM [place]  [facility] Population Health RN Care Manager Direct Dial: [phone]  Fax: [phone]
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
## 2023-10-20 ENCOUNTER — Other Ambulatory Visit: Payer: Self-pay | Admitting: Cardiovascular Disease

## 2023-10-20 ENCOUNTER — Telehealth: Payer: Self-pay | Admitting: Cardiovascular Disease

## 2023-10-20 NOTE — Telephone Encounter (Signed)
 Pt c/o medication issue:  1. Name of Medication: spironolactone  (ALDACTONE ) 25 MG tablet   2. How are you currently taking this medication (dosage and times per day)?   TAKE 1 TABLET(25 MG) BY MOUTH DAILY    3. Are you having a reaction (difficulty breathing--STAT)? No  4. What is your medication issue? Patient is calling because the pharmacy stated the medication was expired. Patient would like to know if she should continue taking the medication. Please advise.

## 2023-10-20 NOTE — Telephone Encounter (Signed)
 Called and spoke to pt. Discussed refill of Spiro was sent to preferred pharmacy; 15 day supply. Pt verbalized understanding and was very thankful for call.

## 2023-10-30 ENCOUNTER — Ambulatory Visit: Admitting: Psychiatry

## 2023-10-31 ENCOUNTER — Ambulatory Visit: Admitting: Cardiovascular Disease

## 2023-11-03 ENCOUNTER — Other Ambulatory Visit: Payer: Self-pay

## 2023-11-03 NOTE — Patient Outreach (Signed)
 Complex Care Management   Visit Note  11/03/2023  Name:  Nicole Gibbs MRN: [id number] DOB: 02-24-1946  Situation: Referral received for Complex Care Management related to Heart Failure and Atrial Fibrillation I obtained verbal consent from Patient.  Visit completed with Nicole Gibbs  on the phone.  She had to reschedule Cardiology appt to 11/28/23, she is on cancellation list for earlier appt.  No concerns today, no changes in health status.   Background:   Past Medical History:  Diagnosis Date   Anxiety    Arthritis    knees   Chronic combined systolic and diastolic CHF (congestive heart failure) (HCC)    a. 07/2012 Echo: EF 55-60%, Gr1 DD;  b. 06/2015 Echo: EF 35-40%, triv AI, Mod MR, mod dil LA, mildly reduced RV.   Depression    DVT, bilateral lower limbs (HCC)    H/O cardiovascular stress test    a. 07/2012 MV: fixed mid-dist anterior and apical defect - scar vs attenuation, distal ant HK, no reversibility, EF 51%-->low risk.   Hx of blood clots    Hypertensive heart disease    Hypothyroidism    Joint pain    Nocturia    Normal coronary arteries    by cardiac catheterization 09/03/15   Obese    OSA on CPAP    Postmenopausal vaginal bleeding    Pulmonary embolism, bilateral (HCC)    a. chronic coumadin .   Rash    under breasts   Skin cancer    Sleep apnea    SOB (shortness of breath)    Swelling of both lower extremities    Thyroid  nodule    no meds    Assessment: Patient Reported Symptoms:  Cognitive Cognitive Status: Alert and oriented to person, place, and time, Insightful and able to interpret abstract concepts, Normal speech and language skills      Neurological Neurological Review of Symptoms: No symptoms reported    HEENT HEENT Symptoms Reported: No symptoms reported      Cardiovascular Cardiovascular Symptoms Reported: Swelling in legs or feet (left leg swells, right leg is not swollen today.) Does patient have uncontrolled Hypertension?: No    Respiratory  Respiratory Symptoms Reported: Shortness of breath Other Respiratory Symptoms: Reports shome shortness of breath with moderate activity but resolves with rest - this is normal for her.    Endocrine Patient reports the following symptoms related to hypoglycemia or hyperglycemia : Not assessed    Gastrointestinal Gastrointestinal Symptoms Reported: No symptoms reported      Genitourinary Genitourinary Symptoms Reported: No symptoms reported    Integumentary Integumentary Symptoms Reported: No symptoms reported    Musculoskeletal Musculoskelatal Symptoms Reviewed: Unsteady gait Additional Musculoskeletal Details: Uses cane for ambulation   Falls in the past year?: No    Psychosocial Psychosocial Symptoms Reported: Not assessed            10/17/2023    1:43 PM  Depression screen PHQ 2/9  Decreased Interest 0  Down, Depressed, Hopeless 0  PHQ - 2 Score 0    There were no vitals filed for this visit.  Medications Reviewed Today   Medications were not reviewed in this encounter     Recommendation:   -Specialty provider follow-up : Cardiology 11/28/23 - she is on cancellation list for earlier appt.  She will discuss need for furosemide  60mg  once a day, confirm fluid intake restrictions (currently only taking in about 4 cups a day), get PT/INR checked. .   -Nicole Gibbs will try to  get in more movement during the day by going to a store and walking around with a cart -As requested by Nicole Gibbs, this RNCM provided list of agencies that can provide personal care assistance for future needs - she has no needs currently.    Follow Up Plan:   Telephone follow-up in 1 month  Jurline Olmsted BSN, CCM [place]  [facility] Population Health RN Care Manager Direct Dial: [phone]  Fax: [phone]

## 2023-11-03 NOTE — Patient Instructions (Signed)
 Visit Information  Ms. Agramonte,  Here are a few home health agencies that provide personal care services:   Suggestions for In Home Care Providers  [facility]- Loney Rivet- [phone] [facility] -Archibald Beard [phone] [place] Divine [phone] [facility] -Roderick [place]atte [phone] [facility] Herring [phone] [facility] Enolia Hartmann [phone] [facility] - [phone] [facility] - [phone]  Thank you for taking time to visit with me today. Please don't hesitate to contact me if I can be of assistance to you before our next scheduled appointment.  Your next care management appointment is by telephone on Friday, July 18th at 1:00pm.    Please call the care guide team at [phone] if you need to cancel, schedule, or reschedule an appointment.   A reminder to ALL patients/family/friends, please call the [place]  National Suicide Prevention Lifeline: 607-540-7225 or TTY: (365)003-5891 TTY 782-242-7796) to talk to a trained counselor if you are experiencing a Mental Health or Behavioral Health Crisis or need someone to talk to.  Jurline Olmsted BSN, CCM [place]  VBCI Population Health RN Care Manager Direct Dial: [phone]  Fax: [phone]

## 2023-11-13 ENCOUNTER — Ambulatory Visit: Admitting: Psychiatry

## 2023-11-20 ENCOUNTER — Other Ambulatory Visit: Payer: Self-pay | Admitting: Cardiovascular Disease

## 2023-11-28 ENCOUNTER — Ambulatory Visit (INDEPENDENT_AMBULATORY_CARE_PROVIDER_SITE_OTHER): Payer: Self-pay | Admitting: Pharmacist

## 2023-11-28 ENCOUNTER — Ambulatory Visit: Attending: Cardiovascular Disease | Admitting: Cardiovascular Disease

## 2023-11-28 ENCOUNTER — Encounter: Payer: Self-pay | Admitting: Cardiovascular Disease

## 2023-11-28 VITALS — BP 138/68 | Ht 60.0 in | Wt 225.0 lb

## 2023-11-28 DIAGNOSIS — I5042 Chronic combined systolic (congestive) and diastolic (congestive) heart failure: Secondary | ICD-10-CM | POA: Insufficient documentation

## 2023-11-28 DIAGNOSIS — I82403 Acute embolism and thrombosis of unspecified deep veins of lower extremity, bilateral: Secondary | ICD-10-CM | POA: Insufficient documentation

## 2023-11-28 DIAGNOSIS — I5043 Acute on chronic combined systolic (congestive) and diastolic (congestive) heart failure: Secondary | ICD-10-CM | POA: Diagnosis present

## 2023-11-28 DIAGNOSIS — E66813 Obesity, class 3: Secondary | ICD-10-CM | POA: Diagnosis present

## 2023-11-28 DIAGNOSIS — I10 Essential (primary) hypertension: Secondary | ICD-10-CM | POA: Diagnosis not present

## 2023-11-28 DIAGNOSIS — Z6841 Body Mass Index (BMI) 40.0 and over, adult: Secondary | ICD-10-CM | POA: Diagnosis present

## 2023-11-28 DIAGNOSIS — Z7901 Long term (current) use of anticoagulants: Secondary | ICD-10-CM | POA: Diagnosis not present

## 2023-11-28 DIAGNOSIS — I2699 Other pulmonary embolism without acute cor pulmonale: Secondary | ICD-10-CM | POA: Insufficient documentation

## 2023-11-28 DIAGNOSIS — I4891 Unspecified atrial fibrillation: Secondary | ICD-10-CM | POA: Insufficient documentation

## 2023-11-28 LAB — POCT INR: INR: 1.7 — AB (ref 2.0–3.0)

## 2023-11-28 MED ORDER — METOPROLOL TARTRATE 25 MG PO TABS: 25.0000 mg | ORAL_TABLET | Freq: Two times a day (BID) | ORAL | 4 refills | Status: DC

## 2023-11-28 MED ORDER — LOSARTAN POTASSIUM 50 MG PO TABS: 50.0000 mg | ORAL_TABLET | Freq: Every day | ORAL | 4 refills | Status: AC

## 2023-11-28 MED ORDER — SPIRONOLACTONE 25 MG PO TABS
25.0000 mg | ORAL_TABLET | Freq: Every day | ORAL | 4 refills | Status: AC
Start: 2023-11-28 — End: ?

## 2023-11-28 MED ORDER — NITROGLYCERIN 0.4 MG SL SUBL: 0.4000 mg | SUBLINGUAL_TABLET | SUBLINGUAL | 3 refills | Status: AC | PRN

## 2023-11-28 NOTE — Progress Notes (Signed)
 11/28/2023 Nicole Gibbs   June 29, 1945  [id number]  Primary Physician Nicole Ronal Czar, FNP Primary Cardiologist: Dorn Nicole Lesches MD Nicole Gibbs, Nicole Gibbs  HPI:  Nicole Gibbs is a 78 y.o.   severely overweight, widowed Caucasian female (husband died 3 years ago), mother of one child (daughter Nicole Gibbs), who I last saw him in the office 07/29/2022.  She currently lives alone, goes out that dinner most nights.  I Initially saw because of preop clearance and shortness of breath on August 02, 2012..She ended up having bilateral DVTs and multiple pulmonary emboli and was admitted and placed on Lovenox  and Coumadin . She had normal LV systolic function and mild RV dysfunction. Ultimately, because of vaginal bleeding, she underwent a D&C with endometrial ablation by Dr. Mat September 04, 2012, without complication. She is no longer bleeding, and her shortness of breath has improved. Her Coumadin  is followed here in our office. A followup CT angiogram revealed complete resolution of her pulmonary emboli but she did have an incidentally noted multinodular goiter. Follow-up lower extremity venous Doppler study showed resolution of her DVT. She does complain of progressive dyspnea which is somewhat limiting. She has been admitted to [facility] in December and again in February with anemia and heart failure. A recent Myoview stress test showed anteroapical scar and a 2-D echo revealed an ejection fraction of 35-40% which represents this is a significant decline compared to her EF 3 years ago which was normal.. She has had a negative upper and lower endoscopy with plans to perform pill endoscopy as well. Her hemoglobin has recently remained stable. I performed outpatient cardiac catheterization on her 09/03/15 revealing normal coronary arteries with ejection fraction 35-445% consistent with a nonischemic cardiomyopathy.    She is on guideline directed optimal medical therapy although we have had difficulty with SGLT2  inhibitor such as Farxiga  and Jardiance  because of affordability.  She walks with a cane or a walker but is minimally ambulatory and does complain of dyspnea.  Her last 2D echo performed 11/25/2020 revealed an EF of 25 to 30% with severe global hypokinesia and grade 2 diastolic dysfunction with mild pulmonary hypertension.  Since I saw her 6 months ago she is remained stable.  She denies chest pain or shortness of breath.  2D echo performed 01/31/2022 revealed slight increase in her ejection fraction up to 40 to 45% with grade 1 diastolic dysfunction.  Since I saw her over a year ago she was admitted with heart failure, A-fib with RVR in April of this year and underwent DC cardioversion and diuresis.  Her echo at that time revealed EF of 30 to 35%.  She was discharged home on GDMT.  She feels clinically improved.   Current Meds  Medication Sig   alendronate (FOSAMAX) 70 MG tablet Take 70 mg by mouth once a week.   CALCIUM PO Take 1 capsule by mouth daily at 12 noon.   furosemide  (LASIX ) 20 MG tablet Take 3 tablets (60 mg total) by mouth daily. (Patient taking differently: Take 40 mg by mouth daily.)   losartan  (COZAAR ) 50 MG tablet Take 50 mg by mouth daily.   medroxyPROGESTERone (PROVERA) 5 MG tablet Take 5 mg by mouth daily in the afternoon.   metoprolol  tartrate (LOPRESSOR ) 25 MG tablet TAKE 1 TABLET(25 MG) BY MOUTH TWICE DAILY   nystatin  cream (MYCOSTATIN ) Apply 1 application topically 2 (two) times daily.   warfarin (COUMADIN ) 3 MG tablet TAKE 1 TABLET BY MOUTH EVERY DAY AS DIRECTED  BY COUMADIN  CLINIC     Allergies  Allergen Reactions   Keflex  [Cephalexin ] Diarrhea   Sulfa Antibiotics Hives    Social History   Socioeconomic History   Marital status: Widowed    Spouse name: Not on file   Number of children: Not on file   Years of education: Not on file   Highest education level: Not on file  Occupational History   Occupation: retired  Tobacco Use   Smoking status: Never    Smokeless tobacco: Never  Substance and Sexual Activity   Alcohol use: No   Drug use: No   Sexual activity: Not on file  Other Topics Concern   Not on file  Social History Narrative   Not on file   Social Drivers of Health   Financial Resource Strain: Low Risk  (09/05/2023)   Overall Financial Resource Strain (CARDIA)    Difficulty of Paying Living Expenses: Not hard at all  Food Insecurity: No Food Insecurity (10/17/2023)   Hunger Vital Sign    Worried About Running Out of Food in the Last Year: Never true    Ran Out of Food in the Last Year: Never true  Transportation Needs: No Transportation Needs (10/17/2023)   PRAPARE - Administrator, Civil Service (Medical): No    Lack of Transportation (Non-Medical): No  Physical Activity: Not on file  Stress: Not on file  Social Connections: Socially Isolated (08/29/2023)   Social Connection and Isolation Panel    Frequency of Communication with Friends and Family: More than three times a week    Frequency of Social Gatherings with Friends and Family: Once a week    Attends Religious Services: Never    Database administrator or Organizations: No    Attends Banker Meetings: Never    Marital Status: Widowed  Intimate Partner Violence: Not At Risk (09/01/2023)   Humiliation, Afraid, Rape, and Kick questionnaire    Fear of Current or Ex-Partner: No    Emotionally Abused: No    Physically Abused: No    Sexually Abused: No     Review of Systems: General: negative for chills, fever, night sweats or weight changes.  Cardiovascular: negative for chest pain, dyspnea on exertion, edema, orthopnea, palpitations, paroxysmal nocturnal dyspnea or shortness of breath Dermatological: negative for rash Respiratory: negative for cough or wheezing Urologic: negative for hematuria Abdominal: negative for nausea, vomiting, diarrhea, bright red blood per rectum, melena, or hematemesis Neurologic: negative for visual changes,  syncope, or dizziness All other systems reviewed and are otherwise negative except as noted above.    Blood pressure 138/68, height 5' (1.524 m), weight 225 lb (102.1 kg), SpO2 96%.  General appearance: alert and no distress Neck: no adenopathy, no carotid bruit, no JVD, supple, symmetrical, trachea midline, and thyroid  not enlarged, symmetric, no tenderness/mass/nodules Lungs: clear to auscultation bilaterally Heart: regular rate and rhythm, S1, S2 normal, no murmur, click, rub or gallop Extremities: 1-2+ bilateral lower extremity edema Pulses: 2+ and symmetric Skin: Skin color, texture, turgor normal. No rashes or lesions Neurologic: Grossly normal  EKG EKG Interpretation Date/Time:  Tuesday November 28 2023 13:44:07 EDT Ventricular Rate:  91 PR Interval:  126 QRS Duration:  106 QT Interval:  404 QTC Calculation: 496 R Axis:   -33  Text Interpretation: Sinus rhythm with Premature atrial complexes and Premature ventricular complexes or Fusion complexes Left axis deviation Incomplete left bundle branch block Left ventricular hypertrophy with repolarization abnormality ( R in aVL ,  Cornell product ) When compared with ECG of 28-Aug-2023 10:30, PREVIOUS ECG IS PRESENT Confirmed by Court Carrier [phone]) on 11/28/2023 1:56:03 PM    ASSESSMENT AND PLAN:   Obesity, unspecified BMI 44  Pulmonary embolism, Bilateral History of bilateral DVT, and pulmonary emboli on Coumadin .  Essential hypertension History of essential hypertension blood pressure measured today at 138/68.  She is on metoprolol  and losartan .  Chronic combined systolic and diastolic CHF (congestive heart failure) (HCC) History of combined systolic and diastolic heart failure with 2D echo performed/14/25 revealing EF of 30 to 35% with global hypokinesia, grade 2 diastolic dysfunction and mild MR.  She is on furosemide  40 mg a day and spironolactone .  In addition, she is on a beta-blocker and losartan .  Atrial fibrillation  with RVR (HCC) History of PAF having undergone DC cardioversion during her recent hospitalization 4/25 on Coumadin  anticoagulation.  She is maintaining sinus rhythm.     Carrier DOROTHA Court MD FACP,FACC,FAHA, [facility] 11/28/2023 2:07 PM

## 2023-11-28 NOTE — Assessment & Plan Note (Signed)
 History of PAF having undergone DC cardioversion during her recent hospitalization 4/25 on Coumadin  anticoagulation.  She is maintaining sinus rhythm.

## 2023-11-28 NOTE — Assessment & Plan Note (Signed)
 History of bilateral DVT, and pulmonary emboli on Coumadin .

## 2023-11-28 NOTE — Assessment & Plan Note (Signed)
 History of combined systolic and diastolic heart failure with 2D echo performed/14/25 revealing EF of 30 to 35% with global hypokinesia, grade 2 diastolic dysfunction and mild MR.  She is on furosemide  40 mg a day and spironolactone .  In addition, she is on a beta-blocker and losartan .

## 2023-11-28 NOTE — Patient Instructions (Signed)
 Take 1.5 tablets (4.5mg ) today then continue taking Warfarin 1 tablet daily.   Recheck INR in 2 weeks. Coumadin  Clinic [phone].

## 2023-11-28 NOTE — Patient Instructions (Signed)
 Medication Instructions:  Your physician recommends that you continue on your current medications as directed. Please refer to the Current Medication list given to you today.  *If you need a refill on your cardiac medications before your next appointment, please call your pharmacy*   Follow-Up: At [facility], you and your health needs are our priority.  As part of our continuing mission to provide you with exceptional heart care, our providers are all part of one team.  This team includes your primary Cardiologist (physician) and Advanced Practice Providers or APPs (Physician Assistants and Nurse Practitioners) who all work together to provide you with the care you need, when you need it.  Your next appointment:   3 month(s)  Provider:   Jon Hails, PA-C, Callie Goodrich, PA-C, Kathleen Johnson, PA-C, Hao Meng, PA-C, Damien Braver, NP, or Katlyn West, NP         Then, Dorn Lesches, MD will plan to see you again in 12 month(s).      We recommend signing up for the patient portal called MyChart.  Sign up information is provided on this After Visit Summary.  MyChart is used to connect with patients for Virtual Visits (Telemedicine).  Patients are able to view lab/test results, encounter notes, upcoming appointments, etc.  Non-urgent messages can be sent to your provider as well.   To learn more about what you can do with MyChart, go to [email or web address].

## 2023-11-28 NOTE — Progress Notes (Signed)
 See anticoag encounter

## 2023-11-28 NOTE — Assessment & Plan Note (Signed)
BMI 44 

## 2023-11-28 NOTE — Assessment & Plan Note (Signed)
 History of essential hypertension blood pressure measured today at 138/68.  She is on metoprolol  and losartan .

## 2023-12-01 ENCOUNTER — Telehealth: Payer: Self-pay | Admitting: Cardiovascular Disease

## 2023-12-01 ENCOUNTER — Other Ambulatory Visit: Payer: Self-pay | Admitting: Cardiovascular Disease

## 2023-12-01 ENCOUNTER — Other Ambulatory Visit: Payer: Self-pay

## 2023-12-01 DIAGNOSIS — I10 Essential (primary) hypertension: Secondary | ICD-10-CM

## 2023-12-01 NOTE — Patient Instructions (Signed)
 Visit Information  Thank you for taking time to visit with me today. Please don't hesitate to contact me if I can be of assistance to you before our next scheduled appointment.  Your next care management appointment is by telephone on Friday, August 8th at 1:00pm.    Please call the care guide team at [phone] if you need to cancel, schedule, or reschedule an appointment.   A reminder to ALL patients/family/friends, please call the [place]  National Suicide Prevention Lifeline: (307)280-1961 or TTY: 475-845-9648 TTY 4095611376) to talk to a trained counselor if you are experiencing a Mental Health or Behavioral Health Crisis or need someone to talk to.  Santana Stamp BSN, CCM [place]  VBCI Population Health RN Care Manager Direct Dial: [phone]  Fax: [phone]

## 2023-12-01 NOTE — Patient Outreach (Signed)
 Complex Care Management   Visit Note  12/01/2023  Name:  Nicole Gibbs MRN: [id number] DOB: [birth year]  Situation: Referral received for Complex Care Management related to Heart Failure and Atrial Fibrillation I obtained verbal consent from Patient.  Visit completed with Nicole Gibbs  on the phone. Main concern today was swelling in left ankle, Cardiologist aware and states it is at baseline when he assessed at last visit. She is looking for compression stocking she can don, and will try the sock helper she has in her home.  Background:   Past Medical History:  Diagnosis Date   Anxiety    Arthritis    knees   Chronic combined systolic and diastolic CHF (congestive heart failure) (HCC)    a. 07/2012 Echo: EF 55-60%, Gr1 DD;  b. 06/2015 Echo: EF 35-40%, triv AI, Mod MR, mod dil LA, mildly reduced RV.   Depression    DVT, bilateral lower limbs (HCC)    H/O cardiovascular stress test    a. 07/2012 MV: fixed mid-dist anterior and apical defect - scar vs attenuation, distal ant HK, no reversibility, EF 51%-->low risk.   Hx of blood clots    Hypertensive heart disease    Hypothyroidism    Joint pain    Nocturia    Normal coronary arteries    by cardiac catheterization 09/03/15   Obese    OSA on CPAP    Postmenopausal vaginal bleeding    Pulmonary embolism, bilateral (HCC)    a. chronic coumadin .   Rash    under breasts   Skin cancer    Sleep apnea    SOB (shortness of breath)    Swelling of both lower extremities    Thyroid  nodule    no meds    Assessment: Patient Reported Symptoms:  Cognitive Cognitive Status: Alert and oriented to person, place, and time, Normal speech and language skills, Insightful and able to interpret abstract concepts      Neurological Neurological Review of Symptoms: No symptoms reported    HEENT HEENT Symptoms Reported: Not assessed      Cardiovascular Cardiovascular Symptoms Reported: Swelling in legs or feet (Swelling in left ankle, Cardio aware. Patient  will find compression stocking she can apply, too difficult to apply what she currently has in the home.) Does patient have uncontrolled Hypertension?: No Cardiovascular Comment: Patient was finally able to get to Cardio appt on 11/28/23.  Respiratory Respiratory Symptoms Reported: Shortness of breath Other Respiratory Symptoms: SHOB at baseline    Endocrine Endocrine Symptoms Reported: Not assessed    Gastrointestinal Gastrointestinal Symptoms Reported: Not assessed      Genitourinary Genitourinary Symptoms Reported: Frequency Genitourinary Comment: Takes diuretic daily, feels this contributes to  frequency  Integumentary Integumentary Symptoms Reported: Not assessed    Musculoskeletal Musculoskelatal Symptoms Reviewed: Unsteady gait        Psychosocial Psychosocial Symptoms Reported: Not assessed            10/17/2023    1:43 PM  Depression screen PHQ 2/9  Decreased Interest 0  Down, Depressed, Hopeless 0  PHQ - 2 Score 0    There were no vitals filed for this visit.  Medications Reviewed Today   Medications were not reviewed in this encounter     Recommendation:   Specialty provider follow-up : PT/INR monthly, will see Cardiology in one year unless she has symptoms. Educated patient to call Cardiology to report increased swelling in legs/ankles/feet, increased shortness of breath, dizziness, syncope/near-syncope, increased fatigue.  Discussed taking  her pulse if these symptoms present and report sustained pulse 120 and over.  Educated on wearing her compression stockings to relieve edema in left ankle, try the sock helper for donning .   Follow Up Plan:   Telephone follow-up two weeks.  Santana Stamp BSN, CCM [place]  VBCI Population Health RN Care Manager Direct Dial: [phone]  Fax: [phone]

## 2023-12-01 NOTE — Telephone Encounter (Signed)
 Pt c/o medication issue:  1. Name of Medication: spironolactone  (ALDACTONE ) 25 MG tablet   metoprolol  tartrate (LOPRESSOR ) 25 MG tablet   2. How are you currently taking this medication (dosage and times per day)? ?  3. Are you having a reaction (difficulty breathing--STAT)? No   4. What is your medication issue? Pt has questions about these two medications and would like to speak to a nurse

## 2023-12-01 NOTE — Telephone Encounter (Signed)
 Spoke with pt regarding her medications. Pt was confused about what she is supposed to be taking. Pt stated someone from [facility] calls her once a month to check on her and stated that she had been taken off of metoprolol  tartrate and spironolactone . Pt stated she thought Dr. Court took her off spironolactone  and taking losartan  instead. When reviewing the chart, it appears the patient is supposed to be taking spironolactone  25 mg once daily, metoprolol  tartarte 25 mg twice daily and losartan  50 mg once daily. Pt was sure she had been told to stop spironolactone . Pt was told her questions would be sent to Dr. Court for review. Pt verbalized understanding. All questions if any were answered.

## 2023-12-04 NOTE — Telephone Encounter (Signed)
 Court Dorn PARAS, MD to Nicole Gibbs Triage     12/03/23 10:15 AM Please arrange a Pharm D appointment to explain and reconcile her meds  I spoke with patient and gave her message from Dr Court.  Patient will follow instructions on recent AVS for now.  Patient requests appointment be with Chris Pavero, PharmD.  She is asking if appointment can be over the telephone or at the time of her next coumadin  appointment on July 29

## 2023-12-12 ENCOUNTER — Ambulatory Visit: Admitting: Pharmacist

## 2023-12-12 ENCOUNTER — Encounter: Payer: Self-pay | Admitting: Pharmacist

## 2023-12-12 ENCOUNTER — Ambulatory Visit: Attending: Cardiovascular Disease | Admitting: Pharmacist

## 2023-12-12 ENCOUNTER — Other Ambulatory Visit (HOSPITAL_COMMUNITY): Payer: Self-pay

## 2023-12-12 DIAGNOSIS — Z7901 Long term (current) use of anticoagulants: Secondary | ICD-10-CM | POA: Insufficient documentation

## 2023-12-12 DIAGNOSIS — I5042 Chronic combined systolic (congestive) and diastolic (congestive) heart failure: Secondary | ICD-10-CM | POA: Diagnosis not present

## 2023-12-12 DIAGNOSIS — I480 Paroxysmal atrial fibrillation: Secondary | ICD-10-CM | POA: Insufficient documentation

## 2023-12-12 DIAGNOSIS — I2699 Other pulmonary embolism without acute cor pulmonale: Secondary | ICD-10-CM | POA: Insufficient documentation

## 2023-12-12 DIAGNOSIS — I82403 Acute embolism and thrombosis of unspecified deep veins of lower extremity, bilateral: Secondary | ICD-10-CM | POA: Diagnosis not present

## 2023-12-12 LAB — POCT INR: INR: 2.1 (ref 2.0–3.0)

## 2023-12-12 MED ORDER — METOPROLOL SUCCINATE ER 50 MG PO TB24: 50.0000 mg | ORAL_TABLET | Freq: Every day | ORAL | 1 refills | Status: AC

## 2023-12-12 NOTE — Progress Notes (Signed)
 Patient ID: Nicole Gibbs                 DOB: [birth year]                      MRN: [id number]     HPI: Nicole Gibbs is a 78 y.o. female referred by Dr. Court to PharmD clinic. PMH is significant for PE, DVT, HTN, CHF, OSA, and A Fib.  Patient lives alone and is confused about her medication. Brought medication bottles in with her.  No longer on Jardiance  due to recurrent yeast infections.   Started on metoprolol  tartrate 25mg  BID in April when diagnosed with A Fib. Had DCCV, has had no symptoms since.  Remains on furosemide , losartan , spironolactone , and warfarin.  Current HTN meds:  Furosemide  40mg  daily Spironolactone  25mg  daily Losartan  50mg  daily  BP goal: <130/80  Wt Readings from Last 3 Encounters:  11/28/23 225 lb (102.1 kg)  10/17/23 217 lb (98.4 kg)  09/28/23 217 lb 1.6 oz (98.5 kg)   BP Readings from Last 3 Encounters:  11/28/23 138/68  08/31/23 109/71  07/29/22 130/72   Pulse Readings from Last 3 Encounters:  08/31/23 74  07/29/22 77  01/18/22 72    Renal function: CrCl cannot be calculated (Patient's most recent lab result is older than the maximum 21 days allowed.).  Past Medical History:  Diagnosis Date   Anxiety    Arthritis    knees   Chronic combined systolic and diastolic CHF (congestive heart failure) (HCC)    a. 07/2012 Echo: EF 55-60%, Gr1 DD;  b. 06/2015 Echo: EF 35-40%, triv AI, Mod MR, mod dil LA, mildly reduced RV.   Depression    DVT, bilateral lower limbs (HCC)    H/O cardiovascular stress test    a. 07/2012 MV: fixed mid-dist anterior and apical defect - scar vs attenuation, distal ant HK, no reversibility, EF 51%-->low risk.   Hx of blood clots    Hypertensive heart disease    Hypothyroidism    Joint pain    Nocturia    Normal coronary arteries    by cardiac catheterization 09/03/15   Obese    OSA on CPAP    Postmenopausal vaginal bleeding    Pulmonary embolism, bilateral (HCC)    a. chronic coumadin .   Rash    under breasts    Skin cancer    Sleep apnea    SOB (shortness of breath)    Swelling of both lower extremities    Thyroid  nodule    no meds    Current Outpatient Medications on File Prior to Visit  Medication Sig Dispense Refill   alendronate (FOSAMAX) 70 MG tablet Take 70 mg by mouth once a week.     CALCIUM PO Take 1 capsule by mouth daily at 12 noon.     Cholecalciferol (VITAMIN D3 PO) Take 1 capsule by mouth daily at 12 noon.     furosemide  (LASIX ) 20 MG tablet Take 2 tablets (40 mg total) by mouth daily.     losartan  (COZAAR ) 50 MG tablet Take 1 tablet (50 mg total) by mouth daily. 90 tablet 4   medroxyPROGESTERone (PROVERA) 5 MG tablet Take 5 mg by mouth daily in the afternoon.     nitroGLYCERIN  (NITROSTAT ) 0.4 MG SL tablet Place 1 tablet (0.4 mg total) under the tongue every 5 (five) minutes as needed for chest pain. 25 tablet 3   spironolactone  (ALDACTONE ) 25 MG tablet Take 1 tablet (25  mg total) by mouth daily. 90 tablet 4   warfarin (COUMADIN ) 3 MG tablet TAKE 1 TABLET BY MOUTH EVERY DAY AS DIRECTED BY COUMADIN  CLINIC 100 tablet 1   zolpidem  (AMBIEN ) 5 MG tablet Take 5 mg by mouth at bedtime.     No current facility-administered medications on file prior to visit.    Allergies  Allergen Reactions   Keflex  [Cephalexin ] Diarrhea   Sulfa Antibiotics Hives     Assessment/Plan:  1. Medication Reconciliation -  Went over each medication indication and dosage with patient. Unfortunately not able to take Jardiance  due to recurrent infections. Will continue to hold. Since patient prefers once daily medications, will switch her Lopressor  to Toprol  which will also provide CHF benefit.   Checked INR in room: 2.1 (goal 2-3). Will continue current dose. Recheck in 4 weeks.  Continue: Furosemide  40mg  daily Losartan  50mg  dialy Spironolactone  25mg  daily Warfarin 3mg  dailly Change metoprolol  tartrate to Toprol  50mg  when she needs next refill F/u as needed  Chris Emeterio Balke, PharmD, BCACP, CDCES,  CPP [facility] [address], [place], [place] [address] Phone: [phone]; Fax: [phone] 12/12/2023 5:17 PM

## 2023-12-12 NOTE — Progress Notes (Signed)
 INR 2.1 Please see anticoagulation encounter

## 2023-12-12 NOTE — Patient Instructions (Addendum)
 Good seeing you today  Continue: Losartan  50mg  (blood pressure) once a day Furosemide  40mg  (water pill) once a day Spironolactone  25mg  (heart failure) once a day Metoprolol  25mg  twice a day (heart rate) Warfarin 3mg  once a day  Please contact us  with any further questions  When you need a refill on your metoprolol  please let us  know  Medford Bolk, PharmD, BCACP, CDCES, CPP [facility] [address] [place], [place] [address] Phone: [phone]; Fax: [phone] 12/12/2023 2:12 PM

## 2023-12-12 NOTE — Patient Instructions (Signed)
Description   Continue taking Warfarin 1 tablet daily.  Recheck INR in 4 weeks.  Coumadin Clinic [phone]

## 2023-12-21 DIAGNOSIS — E049 Nontoxic goiter, unspecified: Secondary | ICD-10-CM | POA: Diagnosis not present

## 2023-12-22 ENCOUNTER — Other Ambulatory Visit: Payer: Self-pay

## 2023-12-22 NOTE — Patient Outreach (Signed)
 Complex Care Management   Visit Note  12/22/2023  Name:  Nicole Gibbs MRN: [id number] DOB: [birth year]  Situation: Referral received for Complex Care Management related to Heart Failure and Atrial Fibrillation I obtained verbal consent from Patient.  Visit completed with Nicole Gibbs  on the phone.  On our last contact, patient states she was unsure about some of her cardiac meds, felt she remembered someone told her to stop taking spironolactone .  Today, she states she is feeling confident about her cardiac medications after seeing pharmacist at Cardiology office on 12/12/23.  She has obtained a scale and is weighing daily.  No other concerns today.   Background:   Past Medical History:  Diagnosis Date   Anxiety    Arthritis    knees   Chronic combined systolic and diastolic CHF (congestive heart failure) (HCC)    a. 07/2012 Echo: EF 55-60%, Gr1 DD;  b. 06/2015 Echo: EF 35-40%, triv AI, Mod MR, mod dil LA, mildly reduced RV.   Depression    DVT, bilateral lower limbs (HCC)    H/O cardiovascular stress test    a. 07/2012 MV: fixed mid-dist anterior and apical defect - scar vs attenuation, distal ant HK, no reversibility, EF 51%-->low risk.   Hx of blood clots    Hypertensive heart disease    Hypothyroidism    Joint pain    Nocturia    Normal coronary arteries    by cardiac catheterization 09/03/15   Obese    OSA on CPAP    Postmenopausal vaginal bleeding    Pulmonary embolism, bilateral (HCC)    a. chronic coumadin .   Rash    under breasts   Skin cancer    Sleep apnea    SOB (shortness of breath)    Swelling of both lower extremities    Thyroid  nodule    no meds    Assessment: Patient Reported Symptoms:  Cognitive Cognitive Status: Alert and oriented to person, place, and time, Insightful and able to interpret abstract concepts, Normal speech and language skills Cognitive/Intellectual Conditions Management [RPT]: None reported or documented in medical history or problem list       Neurological Neurological Review of Symptoms: No symptoms reported    HEENT HEENT Symptoms Reported: No symptoms reported      Cardiovascular Cardiovascular Symptoms Reported: Swelling in legs or feet (Swelling in ankles/feet at baseline, MD has assessed, she elevates.) Cardiovascular Comment: She has a scale and is weighing daily, no weight gains to report.  Respiratory Respiratory Symptoms Reported: Shortness of breath Other Respiratory Symptoms: SHOB ast baseline    Endocrine Endocrine Symptoms Reported: No symptoms reported    Gastrointestinal Gastrointestinal Symptoms Reported: No symptoms reported      Genitourinary Genitourinary Symptoms Reported: Frequency Genitourinary Comment: Takes diuretics daily, feels this contributes to frequency, at baseline.  Integumentary Integumentary Symptoms Reported: No symptoms reported    Musculoskeletal Musculoskelatal Symptoms Reviewed: Unsteady gait   Falls in the past year?: No    Psychosocial Psychosocial Symptoms Reported: Other, No symptoms reported Additional Psychological Details: States she sees a Veterinary surgeon but feels they get nothing accomplished.  There are other counselors there, encouraged her to make appt with another counselor because she truly prefers the location since it's close to her home. Behavioral Management Strategies: Coping strategies, Counseling          10/17/2023    1:43 PM  Depression screen PHQ 2/9  Decreased Interest 0  Down, Depressed, Hopeless 0  PHQ - 2 Score  0    There were no vitals filed for this visit.  Medications Reviewed Today     Reviewed by Lucian Santana LABOR, RN (Registered Nurse) on 12/22/23 at 1309  Med List Status: <None>   Medication Order Taking? Sig Documenting Provider Last Dose Status Informant  alendronate (FOSAMAX) 70 MG tablet [id number] Yes Take 70 mg by mouth once a week. [provider]  Active Self, Pharmacy Records, Multiple Informants           Med Note STEFFI ADELITA Kitchens Aug 28, 2023 12:51 PM) Due for dose  CALCIUM PO [id number] Yes Take 1 capsule by mouth daily at 12 noon. [provider]  Active Self, Pharmacy Records, Multiple Informants  Cholecalciferol (VITAMIN D3 PO) [id number] Yes Take 1 capsule by mouth daily at 12 noon. [provider]  Active Self, Pharmacy Records, Multiple Informants  furosemide  (LASIX ) 20 MG tablet [id number] Yes Take 2 tablets (40 mg total) by mouth daily. Pavero, Lonni, [facility]  Active   losartan  (COZAAR ) 50 MG tablet [id number] Yes Take 1 tablet (50 mg total) by mouth daily. Court Dorn PARAS, MD  Active   medroxyPROGESTERone (PROVERA) 5 MG tablet [id number] Yes Take 5 mg by mouth daily in the afternoon. [provider]  Active Self, Pharmacy Records, Multiple Informants  metoprolol  succinate (TOPROL  XL) 50 MG 24 hr tablet [id number] Yes Take 1 tablet (50 mg total) by mouth daily. Take with or immediately following a meal. Court Dorn PARAS, MD  Active   nitroGLYCERIN  (NITROSTAT ) 0.4 MG SL tablet [id number] Yes Place 1 tablet (0.4 mg total) under the tongue every 5 (five) minutes as needed for chest pain. Court Dorn PARAS, MD  Active   spironolactone  (ALDACTONE ) 25 MG tablet [id number] Yes Take 1 tablet (25 mg total) by mouth daily. Court Dorn PARAS, MD  Active   warfarin (COUMADIN ) 3 MG tablet [id number] Yes TAKE 1 TABLET BY MOUTH EVERY DAY AS DIRECTED BY COUMADIN  CLINIC Court Dorn PARAS, MD  Active Self, Pharmacy Records, Multiple Informants  zolpidem  (AMBIEN ) 5 MG tablet [id number] Yes Take 5 mg by mouth at bedtime. [provider]  Active Self, Pharmacy Records, Multiple Informants           Med Note CARLOTTA, LAMAR A   Wed Oct 26, 2022  3:50 PM) Has used on a longterm basis, est 10 yrs.  5mg  dose, split dose QHS and on middle of the night after waking.  Behavioral health is not acutely concerned.  -- A Mitchum, PhD            Recommendation:   Continue Current Plan of  Care -She is aware of red flags to report to Cardiology regarding weight gains of 3lbs in one day or 5lbs in one week, dizziness/increased SHOB/increased swelling in legs, ankles, feet - she denies these symptoms today.   Follow Up Plan:   Telephone follow-up in 1 month  Santana Lucian BSN, CCM [place]  VBCI Population Health RN Care Manager Direct Dial: [phone]  Fax: [phone]

## 2023-12-22 NOTE — Patient Instructions (Signed)
 Visit Information  Thank you for taking time to visit with me today. Please don't hesitate to contact me if I can be of assistance to you before our next scheduled appointment.  Your next care management appointment is by telephone on Wednesday, September 3rd at 1:00pm.    Please call the care guide team at [phone] if you need to cancel, schedule, or reschedule an appointment.   A reminder to ALL patients/family/friends, please call the [place]  National Suicide Prevention Lifeline: 670-257-1252 or TTY: 5162750566 TTY [phone]) to talk to a trained counselor if you are experiencing a Mental Health or Behavioral Health Crisis or need someone to talk to.  Santana Stamp BSN, CCM [place]  VBCI Population Health RN Care Manager Direct Dial: [phone]  Fax: [phone]

## 2024-01-09 ENCOUNTER — Ambulatory Visit: Attending: Cardiovascular Disease | Admitting: *Deleted

## 2024-01-09 DIAGNOSIS — Z7901 Long term (current) use of anticoagulants: Secondary | ICD-10-CM | POA: Insufficient documentation

## 2024-01-09 DIAGNOSIS — I2699 Other pulmonary embolism without acute cor pulmonale: Secondary | ICD-10-CM | POA: Diagnosis not present

## 2024-01-09 DIAGNOSIS — I82403 Acute embolism and thrombosis of unspecified deep veins of lower extremity, bilateral: Secondary | ICD-10-CM | POA: Diagnosis not present

## 2024-01-09 LAB — POCT INR: INR: 2.1 (ref 2.0–3.0)

## 2024-01-09 NOTE — Patient Instructions (Signed)
 Description   INR-2.1; Continue taking Warfarin 1 tablet daily.   Recheck INR in 5 weeks. Coumadin  Clinic [phone].

## 2024-01-09 NOTE — Progress Notes (Signed)
 Description   INR-2.1; Continue taking Warfarin 1 tablet daily.   Recheck INR in 5 weeks. Coumadin  Clinic [phone].

## 2024-01-17 ENCOUNTER — Other Ambulatory Visit: Payer: Self-pay

## 2024-01-17 NOTE — Patient Outreach (Signed)
 Complex Care Management   Visit Note  01/17/2024  Name:  Nicole Gibbs MRN: [id number] DOB: 10-Mar-1946  Situation: Referral received for Complex Care Management related to Atrial Fibrillation I obtained verbal consent from Patient.  Visit completed with Patient  on the phone. Denies weight gain/fluid retention.  Denies problems with medications, taking as prescribed.  She would like a referral to another Economist.  Background:   Past Medical History:  Diagnosis Date   Anxiety    Arthritis    knees   Chronic combined systolic and diastolic CHF (congestive heart failure) (HCC)    a. 07/2012 Echo: EF 55-60%, Gr1 DD;  b. 06/2015 Echo: EF 35-40%, triv AI, Mod MR, mod dil LA, mildly reduced RV.   Depression    DVT, bilateral lower limbs (HCC)    H/O cardiovascular stress test    a. 07/2012 MV: fixed mid-dist anterior and apical defect - scar vs attenuation, distal ant HK, no reversibility, EF 51%-->low risk.   Hx of blood clots    Hypertensive heart disease    Hypothyroidism    Joint pain    Nocturia    Normal coronary arteries    by cardiac catheterization 09/03/15   Obese    OSA on CPAP    Postmenopausal vaginal bleeding    Pulmonary embolism, bilateral (HCC)    a. chronic coumadin .   Rash    under breasts   Skin cancer    Sleep apnea    SOB (shortness of breath)    Swelling of both lower extremities    Thyroid  nodule    no meds    Assessment: Patient Reported Symptoms:  Cognitive Cognitive Status: Alert and oriented to person, place, and time, Insightful and able to interpret abstract concepts, Normal speech and language skills      Neurological Neurological Review of Symptoms: No symptoms reported    HEENT HEENT Symptoms Reported: No symptoms reported      Cardiovascular      Respiratory Respiratory Symptoms Reported: Shortness of breath Other Respiratory Symptoms: SHOB at baseline    Endocrine Endocrine Symptoms Reported: Not assessed     Gastrointestinal Gastrointestinal Symptoms Reported: No symptoms reported Gastrointestinal Self-Management Outcome: 5 (very good)    Genitourinary Genitourinary Symptoms Reported: No symptoms reported    Integumentary Integumentary Symptoms Reported: No symptoms reported    Musculoskeletal Musculoskelatal Symptoms Reviewed: Unsteady gait Additional Musculoskeletal Details: uses a cane for ambulation   Falls in the past year?: No    Psychosocial Psychosocial Symptoms Reported: Other Other Psychosocial Conditions: She's feels isolated due to no family close by, states going to speak to counselor helps but feels she needs a new counselor because we get nothing accomplished, we just talk about the weather.  On 01/17/24, PHQ9=0          01/17/2024    PHQ2-9 Depression Screening   Little interest or pleasure in doing things Not at all  Feeling down, depressed, or hopeless Not at all  PHQ-2 - Total Score 0  Trouble falling or staying asleep, or sleeping too much    Feeling tired or having little energy    Poor appetite or overeating     Feeling bad about yourself - or that you are a failure or have let yourself or your family down    Trouble concentrating on things, such as reading the newspaper or watching television    Moving or speaking so slowly that other people could have noticed.  Or the opposite -  being so fidgety or restless that you have been moving around a lot more than usual    Thoughts that you would be better off dead, or hurting yourself in some way    PHQ2-9 Total Score    If you checked off any problems, how difficult have these problems made it for you to do your work, take care of things at home, or get along with other people    Depression Interventions/Treatment      There were no vitals filed for this visit.  Medications Reviewed Today     Reviewed by Lucian Santana LABOR, RN (Registered Nurse) on 01/17/24 at 1338  Med List Status: <None>   Medication Order  Taking? Sig Documenting Provider Last Dose Status Informant  alendronate (FOSAMAX) 70 MG tablet [id number]  Take 70 mg by mouth once a week. [provider]  Active Self, Pharmacy Records, Multiple Informants           Med Note STEFFI ADELITA Kitchens Aug 28, 2023 12:51 PM) Due for dose  CALCIUM PO [id number]  Take 1 capsule by mouth daily at 12 noon. [provider]  Active Self, Pharmacy Records, Multiple Informants  Cholecalciferol (VITAMIN D3 PO) [id number]  Take 1 capsule by mouth daily at 12 noon. [provider]  Active Self, Pharmacy Records, Multiple Informants  furosemide  (LASIX ) 20 MG tablet [id number]  Take 2 tablets (40 mg total) by mouth daily. Pavero, Lonni, [facility]  Active   losartan  (COZAAR ) 50 MG tablet [id number]  Take 1 tablet (50 mg total) by mouth daily. Court Dorn PARAS, MD  Active   medroxyPROGESTERone (PROVERA) 5 MG tablet [id number]  Take 5 mg by mouth daily in the afternoon. [provider]  Active Self, Pharmacy Records, Multiple Informants  metoprolol  succinate (TOPROL  XL) 50 MG 24 hr tablet [id number]  Take 1 tablet (50 mg total) by mouth daily. Take with or immediately following a meal. Court Dorn PARAS, MD  Active   nitroGLYCERIN  (NITROSTAT ) 0.4 MG SL tablet [id number]  Place 1 tablet (0.4 mg total) under the tongue every 5 (five) minutes as needed for chest pain. Court Dorn PARAS, MD  Active   spironolactone  (ALDACTONE ) 25 MG tablet [id number]  Take 1 tablet (25 mg total) by mouth daily. Court Dorn PARAS, MD  Active   warfarin (COUMADIN ) 3 MG tablet [id number] Yes TAKE 1 TABLET BY MOUTH EVERY DAY AS DIRECTED BY COUMADIN  CLINIC Court Dorn PARAS, MD  Active Self, Pharmacy Records, Multiple Informants  zolpidem  (AMBIEN ) 5 MG tablet [id number]  Take 5 mg by mouth at bedtime. [provider]  Active Self, Pharmacy Records, Multiple Informants           Med Note CARLOTTA, LAMAR A   Wed Oct 26, 2022  3:50 PM) Has used on a longterm  basis, est 10 yrs.  5mg  dose, split dose QHS and on middle of the night after waking.  Behavioral health is not acutely concerned.  -- LABOR Kendall, PhD            Recommendation:   Continue Current Plan of Care This RNCM contacted [facility]/PCP office to request referral to another behavioral health counseling service instead of [facility].  Left message with staff.    Follow Up Plan:   Telephone follow-up in 1 month  Santana Lucian BSN, CCM [place]  [facility] Population [facility] RN Care Manager Direct Dial: [phone]  Fax: [phone]

## 2024-01-17 NOTE — Patient Instructions (Signed)
 Visit Information  Thank you for taking time to visit with me today. Please don't hesitate to contact me if I can be of assistance to you before our next scheduled appointment.  Your next care management appointment is by telephone on Wednesday, October 1st at 1:00pm.    Please call the care guide team at [phone] if you need to cancel, schedule, or reschedule an appointment.   A reminder to ALL patients/family/friends, please call the [place]  National Suicide Prevention Lifeline: 340-579-0033 or TTY: 445-559-0483 TTY (321) 696-2951) to talk to a trained counselor if you are experiencing a Mental Health or Behavioral Health Crisis or need someone to talk to.  Santana Stamp BSN, CCM [place]  [facility] Population Health RN Care Manager Direct Dial: [phone]  Fax: [phone]

## 2024-01-17 NOTE — Patient Outreach (Signed)
 Contacted office of [facility], left message with staff that patient would like referral to another behavioral health counselor instead of [facility] where she is currently seeing a counselor that she feels isn't helping her.

## 2024-02-13 ENCOUNTER — Ambulatory Visit: Attending: Cardiovascular Disease | Admitting: *Deleted

## 2024-02-13 DIAGNOSIS — I82403 Acute embolism and thrombosis of unspecified deep veins of lower extremity, bilateral: Secondary | ICD-10-CM | POA: Insufficient documentation

## 2024-02-13 DIAGNOSIS — I2699 Other pulmonary embolism without acute cor pulmonale: Secondary | ICD-10-CM | POA: Diagnosis not present

## 2024-02-13 DIAGNOSIS — Z7901 Long term (current) use of anticoagulants: Secondary | ICD-10-CM | POA: Insufficient documentation

## 2024-02-13 LAB — POCT INR: INR: 2.7 (ref 2.0–3.0)

## 2024-02-13 MED ORDER — WARFARIN SODIUM 3 MG PO TABS: ORAL_TABLET | ORAL | 1 refills | Status: AC

## 2024-02-13 NOTE — Patient Instructions (Signed)
 Description   INR-2.7; Continue taking Warfarin 1 tablet daily.   Recheck INR in 5 weeks. Coumadin  Clinic [phone].

## 2024-02-13 NOTE — Progress Notes (Signed)
 Description   INR-2.7; Continue taking Warfarin 1 tablet daily.   Recheck INR in 6 weeks. Coumadin  Clinic [phone].

## 2024-02-14 ENCOUNTER — Other Ambulatory Visit: Payer: Self-pay

## 2024-02-14 DIAGNOSIS — I5043 Acute on chronic combined systolic (congestive) and diastolic (congestive) heart failure: Secondary | ICD-10-CM

## 2024-02-15 NOTE — Patient Instructions (Signed)
 Visit Information  Thank you for taking time to visit with me today. Please don't hesitate to contact me if I can be of assistance to you before our next scheduled appointment.  Your next care management appointment is by telephone on Monday, November 3rd at 1:00pm.  Please call the care guide team at [phone] if you need to cancel, schedule, or reschedule an appointment.   A reminder to ALL patients/family/friends, please call the [place]  National Suicide Prevention Lifeline: (860)760-1653 or TTY: (260)483-1545 TTY (671) 686-1432) to talk to a trained counselor if you are experiencing a Mental Health or Behavioral Health Crisis or need someone to talk to.  Santana Stamp BSN, CCM [place]  [facility] Population Health RN Care Manager Direct Dial: [phone]  Fax: [phone]

## 2024-02-15 NOTE — Patient Outreach (Signed)
 Complex Care Management   Visit Note  02/15/2024  Name:  Nicole Gibbs MRN: [id number] DOB: 10-14-1945  Situation: Referral received for Complex Care Management related to Atrial Fibrillation I obtained verbal consent from Patient.  Visit completed with Nicole Gibbs  on the phone. Main concern today is inquiring if changing pharmacy to [facility] to take advantage of delivery program is cost effective, would benefit from delivery service due to upcoming winter months to avoid driving in inclement weather.   Background:   Past Medical History:  Diagnosis Date   Anxiety    Arthritis    knees   Chronic combined systolic and diastolic CHF (congestive heart failure) (HCC)    a. 07/2012 Echo: EF 55-60%, Gr1 DD;  b. 06/2015 Echo: EF 35-40%, triv AI, Mod MR, mod dil LA, mildly reduced RV.   Depression    DVT, bilateral lower limbs (HCC)    H/O cardiovascular stress test    a. 07/2012 MV: fixed mid-dist anterior and apical defect - scar vs attenuation, distal ant HK, no reversibility, EF 51%-->low risk.   Hx of blood clots    Hypertensive heart disease    Hypothyroidism    Joint pain    Nocturia    Normal coronary arteries    by cardiac catheterization 09/03/15   Obese    OSA on CPAP    Postmenopausal vaginal bleeding    Pulmonary embolism, bilateral (HCC)    a. chronic coumadin .   Rash    under breasts   Skin cancer    Sleep apnea    SOB (shortness of breath)    Swelling of both lower extremities    Thyroid  nodule    no meds    Assessment: Patient Reported Symptoms:  Cognitive Cognitive Status: No symptoms reported, Alert and oriented to person, place, and time, Insightful and able to interpret abstract concepts, Normal speech and language skills      Neurological Neurological Review of Symptoms: No symptoms reported    HEENT HEENT Symptoms Reported: No symptoms reported      Cardiovascular Cardiovascular Symptoms Reported: Swelling in legs or feet (swelling in legs or feet is at  baseline) Cardiovascular Self-Management Outcome: 4 (good)  Respiratory Respiratory Symptoms Reported: No symptoms reported    Endocrine Endocrine Symptoms Reported: No symptoms reported Is patient diabetic?: No    Gastrointestinal Gastrointestinal Symptoms Reported: No symptoms reported      Genitourinary Genitourinary Symptoms Reported: No symptoms reported    Integumentary Integumentary Symptoms Reported: No symptoms reported    Musculoskeletal Musculoskelatal Symptoms Reviewed: Unsteady gait Additional Musculoskeletal Details: Uses cane with        Psychosocial Psychosocial Symptoms Reported: No symptoms reported          02/15/2024    PHQ2-9 Depression Screening   Little interest or pleasure in doing things    Feeling down, depressed, or hopeless    PHQ-2 - Total Score    Trouble falling or staying asleep, or sleeping too much    Feeling tired or having little energy    Poor appetite or overeating     Feeling bad about yourself - or that you are a failure or have let yourself or your family down    Trouble concentrating on things, such as reading the newspaper or watching television    Moving or speaking so slowly that other people could have noticed.  Or the opposite - being so fidgety or restless that you have been moving around a lot more than  usual    Thoughts that you would be better off dead, or hurting yourself in some way    PHQ2-9 Total Score    If you checked off any problems, how difficult have these problems made it for you to do your work, take care of things at home, or get along with other people    Depression Interventions/Treatment      There were no vitals filed for this visit.  Medications Reviewed Today   Medications were not reviewed in this encounter     Recommendation:   Specialty provider follow-up Cardiology 02/29/24 Referral to: [facility] to inquire if its cost effective to change patient's pharmacy to [facility] instead of [facility].    Follow Up Plan:   Telephone follow-up in 1 month  Santana Stamp BSN, CCM [place]  [facility] Population Health RN Care Manager Direct Dial: [phone]  Fax: [phone]

## 2024-02-28 NOTE — Progress Notes (Unsigned)
 Cardiology Office Note:  .   Date:  02/29/2024  ID:  Nicole Gibbs, DOB Dec 03, 1945, MRN [id number] PCP: Royden Ronal Czar, FNP  [facility] Providers Cardiologist:  Dorn Lesches, MD {    History of Present Illness: .   Nicole Gibbs is a 78 y.o. female  with PMHx of normal coronary arteries on cath in 07/2015, Paroxysmal afib s/p DCCV 08/2023, HFrEF/NICM (EF as low as 25-30 in 11/2020 with most recent EF 30-35% in 08/2023), HTN, hx of PE and DVT in 07/2012 (on Warfarin), hyperthyroidism, toxic multinodular goiter s/p RAI in 05/2022, OSA Not on CPAP, prior GI bleed who reports to [facility] office for follow up.   Last seen in heartcare OV 11/28/2023 by Dr. Lesches for hospital follow up related to new onset afib w/ RVR s/p DCCV in 08/2023. Doing well from cardiac standpoint without any cardiac complaints. EKG w/o recurrent afib. Started NTG PRN. Continued Lasix  60 mg daily, Lopressor  25 mg daily, Spirolactone 25 mg daily, and Warfarin per coumadin  clinic.   Today, reports 4-5 twinge sensation in less than 1 minute with minimal exertion that occurred 2-3 x in the past month, which sounds more c/w palpations. Prior to these episodes, denies any history of palpitations. She did not have any symptoms while in afib. Denies any chest pain, orthopnea, dizziness, syncope, active bleeding. Reports chronic unchanged SOB  associated with hill that has been present since she young age that she contributes to sedentary lifestyle. Reports chronic unchanged edema that is managed well with Lasix . Reports adherence with medications. Patient request clarification for the use of NTG. Explained proper use of NTG. Would not recommend to use on current twinge sensation. Endorse very poor diet. Patient is not very active, however goes to grocery store or [employer] once a week to walk around for 1 hour using grocery chart. Denies tobacco use/alcohol/drug use.   ROS: 10 point review of system has been reviewed and considered  negative except ones been listed in the HPI.   Studies Reviewed: .   Lexiscan  06/2015 Her stress test does show large fixed defect/probable scar - consistent with prior heart attack.  She needs to keep her follow up with Dr. Lesches for further discussion and plan.   Cath 07/2015 IMPRESSION:Ms. Demarais has a nonischemic cardiomyopathy with normal coronary arteries and moderate global LV dysfunction.Continue medical therapy will be recommended. The sheath was removed and a TR band was placed on the right wrist which is patent hemostasis. She will need Lovenox  bridging back to Coumadin  anticoagulation. She will be discharged home today as an outpatient and I will see her back in 2 weeks.   ECHO 08/2023 IMPRESSIONS   1. Left ventricular ejection fraction, by estimation, is 30 to 35%. The  left ventricle has moderately decreased function. The left ventricle  demonstrates global hypokinesis. The left ventricular internal cavity size  was mildly dilated. There is mild  concentric left ventricular hypertrophy. Left ventricular diastolic  parameters are consistent with Grade II diastolic dysfunction  (pseudonormalization).   2. Right ventricular systolic function is moderately reduced. The right  ventricular size is normal.   3. Left atrial size was moderately dilated.   4. The mitral valve is degenerative. Mild mitral valve regurgitation. No  evidence of mitral stenosis.   5. The aortic valve is tricuspid. There is mild calcification of the  aortic valve. Aortic valve regurgitation is mild. No aortic stenosis is  present.   6. The inferior vena cava is dilated in size  with <50% respiratory  variability, suggesting right atrial pressure of 15 mmHg.  Risk Assessment/Calculations:    CHA2DS2-VASc Score = 5  This indicates a 7.2% annual risk of stroke. The patient's score is based upon: CHF History: 1 HTN History: 1 Diabetes History: 0 Stroke History: 0 Vascular Disease History: 0 Age Score:  2 Gender Score: 1  Physical Exam:   VS:  BP 114/62 (BP Location: Right Arm, Patient Position: Sitting, Cuff Size: Large)   Pulse 65   Ht 5' 1 (1.549 m)   Wt 230 lb (104.3 kg)   SpO2 93%   BMI 43.46 kg/m    Wt Readings from Last 3 Encounters:  02/29/24 230 lb (104.3 kg)  11/28/23 225 lb (102.1 kg)  10/17/23 217 lb (98.4 kg)    GEN: Well nourished, well developed in no acute distress while sitting in chair.  NECK: No JVD; No carotid bruits CARDIAC: irregular beat, no murmurs, rubs, gallops RESPIRATORY:  Clear to auscultation without rales, wheezing or rhonchi  ABDOMEN: Soft, non-tender, non-distended EXTREMITIES:  No edema; No deformity   ASSESSMENT AND PLAN: .   Paroxysmal A-fib (HCC) Palpitations s/p DCCV 08/2023 ECHO 08/2023: moderately dilated LA Reports 2-3 episodes of palpitations over the past month described as twinge and last for less than 1 minute with exertion. Less concern for ischemic cause based on previous workup and description. Discussed if symptoms were to worsen then would consider further ischemic evaluation.  On exam noted irregular beat. Ordered EKG, which showed NSR w/ sinus arrhythmia w/o any significant change or recurrent afib.   Denies active bleeding.  Order Zio non live x 2 weeks.  K, Mg, TSH, CBC WNL in 08/2023. Based on heart monitor results, can consider rechecking these labs.  Continue on Lopressor  25 mg daily and Warfarin per coumadin  clinic. Discussed considering monitor HR with BP cuff or pulse oximetry as well as Kardia device to monitor for afib. Patient understands and will decide based on Zio results.   HFrEF (heart failure with reduced ejection fraction)  RV dysfunction Nonischemic cardiomyopathy  Normal coronary arteries on cath in 07/2015 ECHO 08/2023: 30-35%, global hypokinesis, mildly dilated LV, mild LVH, G2DD, moderately reduced RV systolic function, mild MV regurgitation, mild AV regurgitation, dilated IVC Reports chronic unchanged  SOB and LE edema.  Denies orthopnea, or PND. Appears Euvolemic on exam.  K 4.1 and Cr 0.95 in 08/2023.  Continue on Lasix  20 mg daily, Losartan  50 mg daily, Toprol  XL 50 mg daily, Spirolactone 25 mg daily.  Not on Jardiance  and Entresto  due to expensive cost and financial assistance was denied. Encouraged low sodium diet, fluid restriction <2L, and daily weights.  Educated to contact our office for weight gain of 2 lbs overnight or 5 lbs in one week. ED precautions discussed.    Essential hypertension BP this OV well controlled today: 114/62 Managed by GDMT as above.  Encourage physical activity for 150 minutes per week and heart healthy low sodium diet. Discussed limiting sodium intake to < 2 grams daily.     History of pulmonary embolism History of DVT Continue on Warfarin per coumadin  clinic  OSA on CPAP Sleep study in 2020: Mild OSA  She never used CPAP due to being uncomfortable.  Discussed how untreated OSA can contribute to atrial fibrillation. She is not interested in repeating sleep study due to cost.     Dispo: Follow up in 2-3 months with Dr. Court  Signed, Lorette CINDERELLA Kapur, PA-C

## 2024-02-29 ENCOUNTER — Ambulatory Visit: Attending: Nurse Practitioner | Admitting: Physician Assistant

## 2024-02-29 ENCOUNTER — Encounter: Payer: Self-pay | Admitting: Nurse Practitioner

## 2024-02-29 ENCOUNTER — Ambulatory Visit: Attending: Physician Assistant

## 2024-02-29 VITALS — BP 114/62 | HR 65 | Ht 61.0 in | Wt 230.0 lb

## 2024-02-29 DIAGNOSIS — R002 Palpitations: Secondary | ICD-10-CM

## 2024-02-29 DIAGNOSIS — I5020 Unspecified systolic (congestive) heart failure: Secondary | ICD-10-CM | POA: Insufficient documentation

## 2024-02-29 DIAGNOSIS — G4733 Obstructive sleep apnea (adult) (pediatric): Secondary | ICD-10-CM | POA: Insufficient documentation

## 2024-02-29 DIAGNOSIS — I480 Paroxysmal atrial fibrillation: Secondary | ICD-10-CM | POA: Insufficient documentation

## 2024-02-29 DIAGNOSIS — Z86718 Personal history of other venous thrombosis and embolism: Secondary | ICD-10-CM | POA: Diagnosis not present

## 2024-02-29 DIAGNOSIS — I10 Essential (primary) hypertension: Secondary | ICD-10-CM | POA: Diagnosis not present

## 2024-02-29 DIAGNOSIS — I428 Other cardiomyopathies: Secondary | ICD-10-CM | POA: Diagnosis not present

## 2024-02-29 DIAGNOSIS — I5189 Other ill-defined heart diseases: Secondary | ICD-10-CM | POA: Insufficient documentation

## 2024-02-29 DIAGNOSIS — Z86711 Personal history of pulmonary embolism: Secondary | ICD-10-CM | POA: Diagnosis not present

## 2024-02-29 NOTE — Patient Instructions (Signed)
 Medication Instructions:  Your physician recommends that you continue on your current medications as directed. Please refer to the Current Medication list given to you today.  *If you need a refill on your cardiac medications before your next appointment, please call your pharmacy*  Lab Work: NONE If you have labs (blood work) drawn today and your tests are completely normal, you will receive your results only by: MyChart Message (if you have MyChart) OR A paper copy in the mail If you have any lab test that is abnormal or we need to change your treatment, we will call you to review the results.  Testing/Procedures: Your physician has requested that you wear a Zio heart monitor for 2 WEEKS. This will be mailed to your home with instructions on how to apply the monitor and how to return it when finished. Please allow 2 weeks after returning the heart monitor before our office calls you with the results.   Follow-Up: At [facility], you and your health needs are our priority.  As part of our continuing mission to provide you with exceptional heart care, our providers are all part of one team.  This team includes your primary Cardiologist (physician) and Advanced Practice Providers or APPs (Physician Assistants and Nurse Practitioners) who all work together to provide you with the care you need, when you need it.  Your next appointment:   2-3 month(s)  Provider:   Dorn Lesches, MD    We recommend signing up for the patient portal called MyChart.  Sign up information is provided on this After Visit Summary.  MyChart is used to connect with patients for Virtual Visits (Telemedicine).  Patients are able to view lab/test results, encounter notes, upcoming appointments, etc.  Non-urgent messages can be sent to your provider as well.   To learn more about what you can do with MyChart, go to [email or web address].   Other Instructions  ZIO XT- Long Term Monitor Instructions  Your  physician has requested you wear a ZIO patch monitor for 2 WEEKS.   This is a single patch monitor. [employer] supplies one patch monitor per enrollment. Additional  stickers are not available. Please do not apply patch if you will be having a Nuclear Stress Test,  Echocardiogram, Cardiac CT, MRI, or Chest Xray during the period you would be wearing the  monitor. The patch cannot be worn during these tests. You cannot remove and re-apply the  ZIO XT patch monitor.   Your ZIO patch monitor will be mailed 3 day USPS to your address on file. It may take 3-5 days  to receive your monitor after you have been enrolled.   Once you have received your monitor, please review the enclosed instructions. Your monitor  has already been registered assigning a specific monitor serial # to you.     Billing and Patient Assistance Program Information  We have supplied [employer] with any of your insurance information on file for billing purposes.  [employer] offers a sliding scale Patient Assistance Program for patients that do not have  insurance, or whose insurance does not completely cover the cost of the ZIO monitor.  You must apply for the Patient Assistance Program to qualify for this discounted rate.   To apply, please call [employer] at [phone], select option 4, select option 2, ask to apply for  Patient Assistance Program. Meredeth will ask your household income, and how many people  are in your household. They will quote your out-of-pocket cost based on that information.  [employer] will also be able to set up a 50-month, interest-free payment plan if needed.     Applying the monitor  Shave hair from upper left chest.  Hold abrader disc by orange tab. Rub abrader in 40 strokes over the upper left chest as  indicated in your monitor instructions.  Clean area with 4 enclosed alcohol pads. Let dry.  Apply patch as indicated in monitor instructions. Patch will be placed under collarbone on left  side of chest  with arrow pointing upward.  Rub patch adhesive wings for 2 minutes. Remove white label marked 1. Remove the white  label marked 2. Rub patch adhesive wings for 2 additional minutes.  While looking in a mirror, press and release button in center of patch. A small green light will  flash 3-4 times. This will be your only indicator that the monitor has been turned on.  Do not shower for the first 24 hours. You may shower after the first 24 hours.  Press the button if you feel a symptom. You will hear a small click. Record Date, Time and  Symptom in the Patient Logbook.  When you are ready to remove the patch, follow instructions on the last 2 pages of Patient  Logbook. Stick patch monitor onto the last page of Patient Logbook.   Place Patient Logbook in the blue and white box. Use locking tab on box and tape box closed  securely. The blue and white box has prepaid postage on it. Please place it in the mailbox as  soon as possible. Your physician should have your test results approximately 7 days after the  monitor has been mailed back to [facility].   Call [facility] Customer Care at [phone] if you have questions regarding  your ZIO XT patch monitor. Call them immediately if you see an orange light blinking on your  monitor.   If your monitor falls off in less than 4 days, contact our Monitor department at [phone].   If your monitor becomes loose or falls off after 4 days call [employer] at [phone] for  suggestions on securing your monitor.

## 2024-02-29 NOTE — Progress Notes (Unsigned)
 Enrolled for [employer] to mail a ZIO XT long term holter monitor to the patients address on file.   Dr. Allyson Sabal to read.

## 2024-03-18 ENCOUNTER — Other Ambulatory Visit: Payer: Self-pay

## 2024-03-18 NOTE — Patient Instructions (Signed)
 Visit Information  Thank you for taking time to visit with me today. Please don't hesitate to contact me if I can be of assistance to you before our next scheduled appointment.  Your next care management appointment is by telephone on Tuesday, December 2nd  at 1:00pm  Please call the care guide team at [phone] if you need to cancel, schedule, or reschedule an appointment.   A reminder to ALL patients/family/friends, please call the [place]  National Suicide Prevention Lifeline: 2052813636 or TTY: 520-063-2281 TTY [phone]) to talk to a trained counselor if you are experiencing a Mental Health or Behavioral Health Crisis or need someone to talk to.  Santana Stamp BSN, CCM [place]  [facility] Population Health RN Care Manager Direct Dial: [phone]  Fax: [phone]

## 2024-03-18 NOTE — Patient Outreach (Addendum)
 Complex Care Management   Visit Note  03/18/2024  Name:  Nicole Gibbs MRN: [id number] DOB: 21-Apr-1946  Situation: Referral received for Complex Care Management related to Atrial Fibrillation I obtained verbal consent from Patient.  Visit completed with Nicole Gibbs  on the phone. Cardiology ordered Zio Patch, patient still needs to take out of box, read instructions, if she has any difficulty she will take it to next coumadin  clinic check and ask nurse to assist with placement.   Background:   Past Medical History:  Diagnosis Date   Anxiety    Arthritis    knees   Chronic combined systolic and diastolic CHF (congestive heart failure) (HCC)    a. 07/2012 Echo: EF 55-60%, Gr1 DD;  b. 06/2015 Echo: EF 35-40%, triv AI, Mod MR, mod dil LA, mildly reduced RV.   Depression    DVT, bilateral lower limbs (HCC)    H/O cardiovascular stress test    a. 07/2012 MV: fixed mid-dist anterior and apical defect - scar vs attenuation, distal ant HK, no reversibility, EF 51%-->low risk.   Hx of blood clots    Hypertensive heart disease    Hypothyroidism    Joint pain    Nocturia    Normal coronary arteries    by cardiac catheterization 09/03/15   Obese    OSA on CPAP    Postmenopausal vaginal bleeding    Pulmonary embolism, bilateral (HCC)    a. chronic coumadin .   Rash    under breasts   Skin cancer    Sleep apnea    SOB (shortness of breath)    Swelling of both lower extremities    Thyroid  nodule    no meds    Assessment: Patient Reported Symptoms:  Cognitive Cognitive Status: Alert and oriented to person, place, and time, Insightful and able to interpret abstract concepts, Normal speech and language skills, No symptoms reported      Neurological Neurological Review of Symptoms: No symptoms reported    HEENT HEENT Symptoms Reported: No symptoms reported      Cardiovascular Cardiovascular Symptoms Reported: Swelling in legs or feet, Other: Other Cardiovascular Symptoms: swelling at  baseline Does patient have uncontrolled Hypertension?: No Cardiovascular Comment: Daily weights, no increases to report, knows to report 2-3lbs in one day/5lbs in one week.  Cardio instructed her to wear a Zio Patch at 02/29/24 OV, she has received it and needs to open the box to read the instructions.  She has no one to help her apply the patch (if needed, she will take it to her next Coumadin  check to see if someone will assist in placing it on her chest.  Respiratory Respiratory Symptoms Reported: No symptoms reported    Endocrine Endocrine Symptoms Reported: No symptoms reported Is patient diabetic?: No    Gastrointestinal Gastrointestinal Symptoms Reported: No symptoms reported      Genitourinary Genitourinary Symptoms Reported: No symptoms reported    Integumentary Integumentary Symptoms Reported: No symptoms reported    Musculoskeletal Musculoskelatal Symptoms Reviewed: Unsteady gait Additional Musculoskeletal Details: Uses cane with ambulation, feels wobbly.        Psychosocial Psychosocial Symptoms Reported: No symptoms reported          03/18/2024    PHQ2-9 Depression Screening   Little interest or pleasure in doing things    Feeling down, depressed, or hopeless    PHQ-2 - Total Score    Trouble falling or staying asleep, or sleeping too much    Feeling tired or having little  energy    Poor appetite or overeating     Feeling bad about yourself - or that you are a failure or have let yourself or your family down    Trouble concentrating on things, such as reading the newspaper or watching television    Moving or speaking so slowly that other people could have noticed.  Or the opposite - being so fidgety or restless that you have been moving around a lot more than usual    Thoughts that you would be better off dead, or hurting yourself in some way    PHQ2-9 Total Score    If you checked off any problems, how difficult have these problems made it for you to do your  work, take care of things at home, or get along with other people    Depression Interventions/Treatment      There were no vitals filed for this visit.  MEDICATIONS: No concerns about refills, taking as prescribed.    Recommendation:   Specialty provider follow-up : Cardiology 05/22/2024 Continue Current Plan of Care  Follow Up Plan:   Telephone follow-up in 1 month  Santana Stamp BSN, CCM [place]  [facility] Population Health RN Care Manager Direct Dial: [phone]  Fax: [phone]

## 2024-03-25 ENCOUNTER — Other Ambulatory Visit: Payer: Self-pay | Admitting: Cardiovascular Disease

## 2024-03-25 DIAGNOSIS — I5043 Acute on chronic combined systolic (congestive) and diastolic (congestive) heart failure: Secondary | ICD-10-CM

## 2024-03-26 ENCOUNTER — Ambulatory Visit: Attending: Cardiovascular Disease | Admitting: *Deleted

## 2024-03-26 DIAGNOSIS — Z7901 Long term (current) use of anticoagulants: Secondary | ICD-10-CM | POA: Diagnosis not present

## 2024-03-26 DIAGNOSIS — I82403 Acute embolism and thrombosis of unspecified deep veins of lower extremity, bilateral: Secondary | ICD-10-CM | POA: Insufficient documentation

## 2024-03-26 DIAGNOSIS — I2699 Other pulmonary embolism without acute cor pulmonale: Secondary | ICD-10-CM | POA: Insufficient documentation

## 2024-03-26 LAB — POCT INR: INR: 2.4 (ref 2.0–3.0)

## 2024-03-26 NOTE — Progress Notes (Signed)
 Description   INR-2.4; Continue taking Warfarin 1 tablet daily.   Recheck INR in 5 weeks prior to going out of town. Coumadin  Clinic [phone].

## 2024-03-26 NOTE — Patient Instructions (Addendum)
 Description   INR-2.4; Continue taking Warfarin 1 tablet daily.   Recheck INR in 5 weeks prior to going out of town. Coumadin  Clinic [phone].

## 2024-03-26 NOTE — Telephone Encounter (Signed)
 Confirmed with patient she has been taking lasix  40mg  daily in the morning as she use to use the 20mg  tabs and took 2 tabs.  Last Appt 02/29/24

## 2024-04-15 DIAGNOSIS — Z124 Encounter for screening for malignant neoplasm of cervix: Secondary | ICD-10-CM | POA: Diagnosis not present

## 2024-04-15 DIAGNOSIS — Z6841 Body Mass Index (BMI) 40.0 and over, adult: Secondary | ICD-10-CM | POA: Diagnosis not present

## 2024-04-15 DIAGNOSIS — N950 Postmenopausal bleeding: Secondary | ICD-10-CM | POA: Diagnosis not present

## 2024-04-15 DIAGNOSIS — Z1231 Encounter for screening mammogram for malignant neoplasm of breast: Secondary | ICD-10-CM | POA: Diagnosis not present

## 2024-04-15 DIAGNOSIS — B379 Candidiasis, unspecified: Secondary | ICD-10-CM | POA: Diagnosis not present

## 2024-04-16 ENCOUNTER — Other Ambulatory Visit: Payer: Self-pay

## 2024-04-16 NOTE — Patient Outreach (Addendum)
 Complex Care Management   Visit Note  04/16/2024  Name:  Nicole Gibbs MRN: [id number] DOB: December 17, 1945  Situation: Referral received for Complex Care Management related to Atrial Fibrillation I obtained verbal consent from Patient.  Visit completed with Nicole Gibbs  on the phone.  Reports she had her Mammogram and Pap Smear on a recent visit with Dr. Dede. She will get COVID and Influenza vaccine this month.   Background:   Past Medical History:  Diagnosis Date   Anxiety    Arthritis    knees   Chronic combined systolic and diastolic CHF (congestive heart failure) (HCC)    a. 07/2012 Echo: EF 55-60%, Gr1 DD;  b. 06/2015 Echo: EF 35-40%, triv AI, Mod MR, mod dil LA, mildly reduced RV.   Depression    DVT, bilateral lower limbs (HCC)    H/O cardiovascular stress test    a. 07/2012 MV: fixed mid-dist anterior and apical defect - scar vs attenuation, distal ant HK, no reversibility, EF 51%-->low risk.   Hx of blood clots    Hypertensive heart disease    Hypothyroidism    Joint pain    Nocturia    Normal coronary arteries    by cardiac catheterization 09/03/15   Obese    OSA on CPAP    Postmenopausal vaginal bleeding    Pulmonary embolism, bilateral (HCC)    a. chronic coumadin .   Rash    under breasts   Skin cancer    Sleep apnea    SOB (shortness of breath)    Swelling of both lower extremities    Thyroid  nodule    no meds    Assessment: Patient Reported Symptoms:  Cognitive Cognitive Status: Alert and oriented to person, place, and time, Insightful and able to interpret abstract concepts, Normal speech and language skills, No symptoms reported      Neurological Neurological Review of Symptoms: No symptoms reported    HEENT HEENT Symptoms Reported: Not assessed      Cardiovascular Cardiovascular Symptoms Reported: Other: Other Cardiovascular Symptoms: Denies chest pain    Respiratory Respiratory Symptoms Reported: No symptoms reported    Endocrine Endocrine  Symptoms Reported: Not assessed Is patient diabetic?: No    Gastrointestinal Gastrointestinal Symptoms Reported: No symptoms reported      Genitourinary Genitourinary Symptoms Reported: No symptoms reported Genitourinary Self-Management Outcome: 4 (good) Genitourinary Comment: Dr. Mat prescribed Fluconazole  150mg  one tablet once just in case patient gets a yeast infection.  Patient is going out of town for Christmas and wants to be prepared just in case.  She had a yeast infection around this time last year but states it was probably related to taking Jardiance , which she is no longer on .  Integumentary Integumentary Symptoms Reported: No symptoms reported    Musculoskeletal Musculoskelatal Symptoms Reviewed: Unsteady gait Additional Musculoskeletal Details: Uses cane with ambulation, feels wobbly, this is at baseline/ Musculoskeletal Self-Management Outcome: 4 (good)      Psychosocial Psychosocial Symptoms Reported: Not assessed          04/16/2024    PHQ2-9 Depression Screening   Little interest or pleasure in doing things    Feeling down, depressed, or hopeless    PHQ-2 - Total Score    Trouble falling or staying asleep, or sleeping too much    Feeling tired or having little energy    Poor appetite or overeating     Feeling bad about yourself - or that you are a failure or have let yourself or  your family down    Trouble concentrating on things, such as reading the newspaper or watching television    Moving or speaking so slowly that other people could have noticed.  Or the opposite - being so fidgety or restless that you have been moving around a lot more than usual    Thoughts that you would be better off dead, or hurting yourself in some way    PHQ2-9 Total Score    If you checked off any problems, how difficult have these problems made it for you to do your work, take care of things at home, or get along with other people    Depression Interventions/Treatment       There were no vitals filed for this visit. Pain Scale: 0-10 Pain Score: 0-No pain  MEDICATIONS: Denies concerns with meds, no issues with refills, taking as prescribed.    Recommendation:   Specialty provider follow-up Coumadin  Clinic 05/02/24; Cardiology 05/22/2024 Continue Current Plan of Care  Follow Up Plan:   Telephone follow-up 6 weeks due to patient is going out of town for Christmas and may not be back until 2nd week of January.   Santana Stamp BSN, CCM [place]  [facility] Population Health RN Care Manager Direct Dial: [phone]  Fax: [phone]

## 2024-04-16 NOTE — Patient Instructions (Signed)
 Visit Information  Thank you for taking time to visit with me today. Please don't hesitate to contact me if I can be of assistance to you before our next scheduled appointment.  Your next care management appointment is by telephone on Tuesday, January 13th at 1:00pm,.    Please call the care guide team at [phone] if you need to cancel, schedule, or reschedule an appointment.   Please call the [place]  National Suicide Prevention Lifeline: (786)207-1474 or TTY: (559)784-2332 TTY (931) 568-4565) to talk to a trained counselor if you are experiencing a Mental Health or Behavioral Health Crisis or need someone to talk to.  Santana Stamp BSN, CCM [place]  [facility] Population Health RN Care Manager Direct Dial: [phone]  Fax: [phone]]

## 2024-04-25 DIAGNOSIS — Z23 Encounter for immunization: Secondary | ICD-10-CM | POA: Diagnosis not present

## 2024-05-01 ENCOUNTER — Other Ambulatory Visit (HOSPITAL_COMMUNITY): Payer: Self-pay

## 2024-05-02 ENCOUNTER — Ambulatory Visit: Attending: Cardiovascular Disease

## 2024-05-02 DIAGNOSIS — Z7901 Long term (current) use of anticoagulants: Secondary | ICD-10-CM | POA: Diagnosis present

## 2024-05-02 DIAGNOSIS — I82403 Acute embolism and thrombosis of unspecified deep veins of lower extremity, bilateral: Secondary | ICD-10-CM | POA: Insufficient documentation

## 2024-05-02 DIAGNOSIS — Z5181 Encounter for therapeutic drug level monitoring: Secondary | ICD-10-CM | POA: Diagnosis present

## 2024-05-02 DIAGNOSIS — I4891 Unspecified atrial fibrillation: Secondary | ICD-10-CM | POA: Diagnosis present

## 2024-05-02 LAB — POCT INR: POC INR: 2.3

## 2024-05-02 NOTE — Patient Instructions (Signed)
 Description   INR-2.3; Continue taking Warfarin 1 tablet daily.   Recheck INR in 6 weeks. Coumadin  Clinic [phone].

## 2024-05-02 NOTE — Progress Notes (Signed)
 Lab Results  Component Value Date   INR 2.3 05/02/2024   INR 2.4 03/26/2024   INR 2.7 02/13/2024    Description   INR-2.3; Continue taking Warfarin 1 tablet daily.   Recheck INR in 6 weeks. Coumadin  Clinic [phone].

## 2024-05-22 ENCOUNTER — Ambulatory Visit: Admitting: Cardiovascular Disease

## 2024-05-28 ENCOUNTER — Other Ambulatory Visit: Payer: Self-pay

## 2024-05-28 NOTE — Patient Outreach (Addendum)
 Complex Care Management   Visit Note  05/28/2024  Name:  Nicole Gibbs MRN: [id number] DOB: [birth year]  Situation: Referral received for Complex Care Management related to Heart Failure and Atrial Fibrillation I obtained verbal consent from Patient.  Visit completed with Ms. Mayorga  on the phone  Background:   Past Medical History:  Diagnosis Date   Anxiety    Arthritis    knees   Chronic combined systolic and diastolic CHF (congestive heart failure) (HCC)    a. 07/2012 Echo: EF 55-60%, Gr1 DD;  b. 06/2015 Echo: EF 35-40%, triv AI, Mod MR, mod dil LA, mildly reduced RV.   Depression    DVT, bilateral lower limbs (HCC)    H/O cardiovascular stress test    a. 07/2012 MV: fixed mid-dist anterior and apical defect - scar vs attenuation, distal ant HK, no reversibility, EF 51%-->low risk.   Hx of blood clots    Hypertensive heart disease    Hypothyroidism    Joint pain    Nocturia    Normal coronary arteries    by cardiac catheterization 09/03/15   Obese    OSA on CPAP    Postmenopausal vaginal bleeding    Pulmonary embolism, bilateral (HCC)    a. chronic coumadin .   Rash    under breasts   Skin cancer    Sleep apnea    SOB (shortness of breath)    Swelling of both lower extremities    Thyroid  nodule    no meds    Assessment: Patient Reported Symptoms:  Cognitive Cognitive Status: Alert and oriented to person, place, and time, Insightful and able to interpret abstract concepts, Normal speech and language skills      Neurological Neurological Review of Symptoms: No symptoms reported    HEENT HEENT Symptoms Reported: Not assessed      Cardiovascular Cardiovascular Symptoms Reported: No symptoms reported Cardiovascular Comment: Reminded pateint to place Zio Patch so she can be finished before next Cardio visit (she didn't want to start wearing it at the holidays due to traveling).  She continues to get PT/INR checks as scheduled.  Respiratory Respiratory Symptoms Reported: Dry  cough Other Respiratory Symptoms: Reports a chest cough, non-productive. Denies    Endocrine Endocrine Symptoms Reported: Not assessed Is patient diabetic?: No    Gastrointestinal Gastrointestinal Symptoms Reported: No symptoms reported      Genitourinary Genitourinary Symptoms Reported: No symptoms reported    Integumentary Integumentary Symptoms Reported: No symptoms reported    Musculoskeletal Musculoskelatal Symptoms Reviewed: Unsteady gait Additional Musculoskeletal Details: Uses cane with ambulation, feels wobbly, this is at baseline        Psychosocial Psychosocial Symptoms Reported: Not assessed          05/28/2024    PHQ2-9 Depression Screening   Little interest or pleasure in doing things    Feeling down, depressed, or hopeless    PHQ-2 - Total Score    Trouble falling or staying asleep, or sleeping too much    Feeling tired or having little energy    Poor appetite or overeating     Feeling bad about yourself - or that you are a failure or have let yourself or your family down    Trouble concentrating on things, such as reading the newspaper or watching television    Moving or speaking so slowly that other people could have noticed.  Or the opposite - being so fidgety or restless that you have been moving around a lot more than usual  Thoughts that you would be better off dead, or hurting yourself in some way    PHQ2-9 Total Score    If you checked off any problems, how difficult have these problems made it for you to do your work, take care of things at home, or get along with other people    Depression Interventions/Treatment      There were no vitals filed for this visit.    MEDICATIONS: Denies concerns with meds, no issues with refills, taking as prescribed.    Recommendation:   Specialty provider follow-up Cardiology 06/25/24 Reminded patient to place Zio Patch as ordered by Cardiology back in October (patient didn't want to place the patch due to  holiday traveling).    Follow Up Plan:   Telephone follow-up in 1 month  Santana Stamp BSN, CCM [place]  [facility] Population Health RN Care Manager Direct Dial: [phone]  Fax: [phone]

## 2024-05-28 NOTE — Patient Instructions (Signed)
 Visit Information  Thank you for taking time to visit with me today. Please don't hesitate to contact me if I can be of assistance to you before our next scheduled appointment.  Your next care management appointment is by telephone on Wednesday, February 11th at 1:00pm.   Please call the care guide team at [phone] if you need to cancel, schedule, or reschedule an appointment.   Please call the [place]  National Suicide Prevention Lifeline: 812-359-4007 or TTY: 618-108-7361 TTY 404-663-5155) to talk to a trained counselor if you are experiencing a Mental Health or Behavioral Health Crisis or need someone to talk to.  Santana Stamp BSN, CCM [place]  [facility] Population Health RN Care Manager Direct Dial: [phone]  Fax: [phone]

## 2024-06-25 ENCOUNTER — Ambulatory Visit

## 2024-06-25 ENCOUNTER — Ambulatory Visit: Admitting: Cardiovascular Disease

## 2024-06-26 ENCOUNTER — Telehealth
